# Patient Record
Sex: Male | Born: 1945 | Race: White | Hispanic: No | Smoking: Former smoker
Health system: Southern US, Community
[De-identification: ages and names within clinical notes are randomized; demographics above are authoritative.]

## PROBLEM LIST (undated history)

## (undated) ENCOUNTER — Emergency Department (HOSPITAL_COMMUNITY): Admission: EM | Payer: Medicare HMO | Source: Home / Self Care

## (undated) ENCOUNTER — Emergency Department (HOSPITAL_COMMUNITY): Admission: EM | Payer: Medicare Other | Source: Home / Self Care

## (undated) DIAGNOSIS — Z8739 Personal history of other diseases of the musculoskeletal system and connective tissue: Secondary | ICD-10-CM

## (undated) DIAGNOSIS — I509 Heart failure, unspecified: Secondary | ICD-10-CM

## (undated) DIAGNOSIS — E785 Hyperlipidemia, unspecified: Secondary | ICD-10-CM

## (undated) DIAGNOSIS — N400 Enlarged prostate without lower urinary tract symptoms: Secondary | ICD-10-CM

## (undated) DIAGNOSIS — Z8679 Personal history of other diseases of the circulatory system: Secondary | ICD-10-CM

## (undated) DIAGNOSIS — Z9889 Other specified postprocedural states: Secondary | ICD-10-CM

## (undated) DIAGNOSIS — E781 Pure hyperglyceridemia: Secondary | ICD-10-CM

## (undated) DIAGNOSIS — D571 Sickle-cell disease without crisis: Secondary | ICD-10-CM

## (undated) DIAGNOSIS — R200 Anesthesia of skin: Secondary | ICD-10-CM

## (undated) DIAGNOSIS — H579 Unspecified disorder of eye and adnexa: Secondary | ICD-10-CM

## (undated) DIAGNOSIS — R7303 Prediabetes: Secondary | ICD-10-CM

## (undated) DIAGNOSIS — I2510 Atherosclerotic heart disease of native coronary artery without angina pectoris: Secondary | ICD-10-CM

## (undated) DIAGNOSIS — I447 Left bundle-branch block, unspecified: Secondary | ICD-10-CM

## (undated) DIAGNOSIS — R972 Elevated prostate specific antigen [PSA]: Secondary | ICD-10-CM

## (undated) DIAGNOSIS — M1990 Unspecified osteoarthritis, unspecified site: Secondary | ICD-10-CM

## (undated) DIAGNOSIS — I428 Other cardiomyopathies: Secondary | ICD-10-CM

## (undated) DIAGNOSIS — R519 Headache, unspecified: Secondary | ICD-10-CM

## (undated) DIAGNOSIS — Z85820 Personal history of malignant melanoma of skin: Secondary | ICD-10-CM

## (undated) DIAGNOSIS — C439 Malignant melanoma of skin, unspecified: Secondary | ICD-10-CM

## (undated) HISTORY — PX: BACK SURGERY: SHX140

## (undated) HISTORY — DX: Headache, unspecified: R51.9

## (undated) HISTORY — DX: Unspecified disorder of eye and adnexa: H57.9

## (undated) HISTORY — DX: Malignant melanoma of skin, unspecified: C43.9

## (undated) HISTORY — DX: Anesthesia of skin: R20.0

## (undated) HISTORY — DX: Heart failure, unspecified: I50.9

## (undated) HISTORY — DX: Personal history of other diseases of the musculoskeletal system and connective tissue: Z87.39

## (undated) HISTORY — PX: CARPAL TUNNEL RELEASE: SHX101

## (undated) HISTORY — DX: Hyperlipidemia, unspecified: E78.5

## (undated) HISTORY — PX: OTHER SURGICAL HISTORY: SHX169

## (undated) HISTORY — DX: Personal history of other diseases of the circulatory system: Z86.79

## (undated) HISTORY — DX: Unspecified osteoarthritis, unspecified site: M19.90

## (undated) HISTORY — PX: CARDIAC CATHETERIZATION: SHX172

## (undated) HISTORY — DX: Other cardiomyopathies: I42.8

## (undated) HISTORY — DX: Sickle-cell disease without crisis: D57.1

## (undated) HISTORY — PX: APPENDECTOMY: SHX54

---

## 1998-10-05 ENCOUNTER — Ambulatory Visit (HOSPITAL_COMMUNITY): Admission: RE | Admit: 1998-10-05 | Discharge: 1998-10-05 | Payer: Self-pay | Admitting: Cardiology

## 1998-10-05 ENCOUNTER — Encounter: Payer: Self-pay | Admitting: Cardiology

## 1998-10-05 HISTORY — PX: CARDIOVASCULAR STRESS TEST: SHX262

## 1998-10-15 ENCOUNTER — Ambulatory Visit (HOSPITAL_COMMUNITY): Admission: RE | Admit: 1998-10-15 | Discharge: 1998-10-15 | Payer: Self-pay | Admitting: Cardiology

## 1999-01-02 ENCOUNTER — Emergency Department (HOSPITAL_COMMUNITY): Admission: EM | Admit: 1999-01-02 | Discharge: 1999-01-02 | Payer: Self-pay | Admitting: Emergency Medicine

## 2000-08-14 ENCOUNTER — Encounter: Admission: RE | Admit: 2000-08-14 | Discharge: 2000-08-14 | Payer: Self-pay | Admitting: Cardiology

## 2000-08-14 ENCOUNTER — Encounter: Payer: Self-pay | Admitting: Cardiology

## 2000-09-08 ENCOUNTER — Emergency Department (HOSPITAL_COMMUNITY): Admission: EM | Admit: 2000-09-08 | Discharge: 2000-09-08 | Payer: Self-pay | Admitting: Emergency Medicine

## 2000-09-11 ENCOUNTER — Inpatient Hospital Stay (HOSPITAL_COMMUNITY): Admission: AD | Admit: 2000-09-11 | Discharge: 2000-09-13 | Payer: Self-pay | Admitting: *Deleted

## 2000-09-11 ENCOUNTER — Encounter: Payer: Self-pay | Admitting: *Deleted

## 2000-09-12 ENCOUNTER — Encounter: Payer: Self-pay | Admitting: *Deleted

## 2000-09-12 HISTORY — PX: LUMBAR DISC SURGERY: SHX700

## 2001-04-19 ENCOUNTER — Ambulatory Visit (HOSPITAL_BASED_OUTPATIENT_CLINIC_OR_DEPARTMENT_OTHER): Admission: RE | Admit: 2001-04-19 | Discharge: 2001-04-19 | Payer: Self-pay | Admitting: Urology

## 2001-04-19 ENCOUNTER — Encounter (INDEPENDENT_AMBULATORY_CARE_PROVIDER_SITE_OTHER): Payer: Self-pay | Admitting: Specialist

## 2001-06-13 ENCOUNTER — Encounter: Payer: Self-pay | Admitting: *Deleted

## 2001-06-13 ENCOUNTER — Encounter: Admission: RE | Admit: 2001-06-13 | Discharge: 2001-06-13 | Payer: Self-pay | Admitting: *Deleted

## 2001-07-24 ENCOUNTER — Encounter: Admission: RE | Admit: 2001-07-24 | Discharge: 2001-07-24 | Payer: Self-pay | Admitting: *Deleted

## 2001-07-24 ENCOUNTER — Encounter: Payer: Self-pay | Admitting: *Deleted

## 2002-03-04 ENCOUNTER — Encounter: Admission: RE | Admit: 2002-03-04 | Discharge: 2002-03-04 | Payer: Self-pay | Admitting: *Deleted

## 2002-03-04 ENCOUNTER — Encounter: Payer: Self-pay | Admitting: *Deleted

## 2002-04-28 HISTORY — PX: LEFT HEART CATH AND CORONARY ANGIOGRAPHY: CATH118249

## 2002-05-22 ENCOUNTER — Inpatient Hospital Stay (HOSPITAL_COMMUNITY): Admission: EM | Admit: 2002-05-22 | Discharge: 2002-05-24 | Payer: Self-pay | Admitting: Emergency Medicine

## 2002-05-22 ENCOUNTER — Encounter: Payer: Self-pay | Admitting: Emergency Medicine

## 2002-05-23 ENCOUNTER — Encounter: Payer: Self-pay | Admitting: Cardiology

## 2002-06-27 ENCOUNTER — Encounter: Payer: Self-pay | Admitting: *Deleted

## 2002-06-27 ENCOUNTER — Encounter: Admission: RE | Admit: 2002-06-27 | Discharge: 2002-06-27 | Payer: Self-pay | Admitting: *Deleted

## 2002-07-22 ENCOUNTER — Encounter: Payer: Self-pay | Admitting: *Deleted

## 2002-07-22 ENCOUNTER — Encounter: Admission: RE | Admit: 2002-07-22 | Discharge: 2002-07-22 | Payer: Self-pay | Admitting: *Deleted

## 2004-06-30 ENCOUNTER — Encounter: Admission: RE | Admit: 2004-06-30 | Discharge: 2004-06-30 | Payer: Self-pay | Admitting: Family Medicine

## 2004-08-13 ENCOUNTER — Encounter: Admission: RE | Admit: 2004-08-13 | Discharge: 2004-08-13 | Payer: Self-pay | Admitting: Family Medicine

## 2005-05-23 ENCOUNTER — Encounter (INDEPENDENT_AMBULATORY_CARE_PROVIDER_SITE_OTHER): Payer: Self-pay | Admitting: *Deleted

## 2005-05-23 ENCOUNTER — Ambulatory Visit (HOSPITAL_BASED_OUTPATIENT_CLINIC_OR_DEPARTMENT_OTHER): Admission: RE | Admit: 2005-05-23 | Discharge: 2005-05-23 | Payer: Self-pay | Admitting: Urology

## 2006-04-10 ENCOUNTER — Encounter: Admission: RE | Admit: 2006-04-10 | Discharge: 2006-04-10 | Payer: Self-pay | Admitting: Family Medicine

## 2007-04-24 ENCOUNTER — Ambulatory Visit: Payer: Self-pay | Admitting: Gastroenterology

## 2007-05-08 ENCOUNTER — Ambulatory Visit: Payer: Self-pay | Admitting: Gastroenterology

## 2007-05-08 ENCOUNTER — Encounter: Payer: Self-pay | Admitting: Gastroenterology

## 2007-05-08 ENCOUNTER — Encounter (INDEPENDENT_AMBULATORY_CARE_PROVIDER_SITE_OTHER): Payer: Self-pay | Admitting: *Deleted

## 2008-03-31 ENCOUNTER — Ambulatory Visit (HOSPITAL_COMMUNITY): Admission: RE | Admit: 2008-03-31 | Discharge: 2008-03-31 | Payer: Self-pay | Admitting: Cardiology

## 2009-03-28 DIAGNOSIS — C439 Malignant melanoma of skin, unspecified: Secondary | ICD-10-CM

## 2009-03-28 HISTORY — DX: Malignant melanoma of skin, unspecified: C43.9

## 2009-06-23 ENCOUNTER — Ambulatory Visit: Payer: Self-pay | Admitting: Gastroenterology

## 2009-06-23 DIAGNOSIS — K625 Hemorrhage of anus and rectum: Secondary | ICD-10-CM | POA: Insufficient documentation

## 2009-07-09 LAB — CONVERTED CEMR LAB
Basophils Absolute: 0 10*3/uL (ref 0.0–0.1)
Basophils Relative: 0.8 % (ref 0.0–3.0)
Eosinophils Absolute: 0.2 10*3/uL (ref 0.0–0.7)
Eosinophils Relative: 4.1 % (ref 0.0–5.0)
HCT: 42.5 % (ref 39.0–52.0)
Hemoglobin: 13.7 g/dL (ref 13.0–17.0)
Lymphocytes Relative: 44.3 % (ref 12.0–46.0)
Lymphs Abs: 2 10*3/uL (ref 0.7–4.0)
MCHC: 32.3 g/dL (ref 30.0–36.0)
MCV: 92.5 fL (ref 78.0–100.0)
Monocytes Absolute: 0.4 10*3/uL (ref 0.1–1.0)
Monocytes Relative: 8 % (ref 3.0–12.0)
Neutro Abs: 1.9 10*3/uL (ref 1.4–7.7)
Neutrophils Relative %: 42.8 % — ABNORMAL LOW (ref 43.0–77.0)
Platelets: 149 10*3/uL — ABNORMAL LOW (ref 150.0–400.0)
RBC: 4.59 M/uL (ref 4.22–5.81)
RDW: 12.2 % (ref 11.5–14.6)
WBC: 4.5 10*3/uL (ref 4.5–10.5)

## 2009-09-04 DIAGNOSIS — I428 Other cardiomyopathies: Secondary | ICD-10-CM | POA: Insufficient documentation

## 2009-09-04 HISTORY — PX: TRANSTHORACIC ECHOCARDIOGRAM: SHX275

## 2009-10-14 ENCOUNTER — Ambulatory Visit (HOSPITAL_COMMUNITY): Admission: RE | Admit: 2009-10-14 | Discharge: 2009-10-14 | Payer: Self-pay | Admitting: Surgery

## 2009-10-14 HISTORY — PX: OTHER SURGICAL HISTORY: SHX169

## 2009-10-17 ENCOUNTER — Emergency Department (HOSPITAL_COMMUNITY): Admission: EM | Admit: 2009-10-17 | Discharge: 2009-10-17 | Payer: Self-pay | Admitting: Emergency Medicine

## 2010-04-29 NOTE — Procedures (Signed)
Summary: Colon   Colonoscopy  Procedure date:  05/08/2007  Findings:      Location:  [facility].   Patient Name: [name], [name] MRN:  Procedure Procedures: Colonoscopy CPT: 95621.    with biopsy. CPT: Q5068410.  Personnel: Endoscopist: Rachael Fee, MD.  Referred By: Meredith Staggers, M.D.  Exam Location: Exam performed in Endoscopy Suite. Outpatient  Patient Consent: Procedure, Alternatives, Risks and Benefits discussed, consent obtained, from patient. Consent was obtained by the RN.  Indications  Average Risk Screening Routine.  History  Current Medications: Patient is not currently taking Coumadin.  Comments: Patient history reviewed and updated, pre-procedure physical performed prior to initiation of sedation? yes Pre-Exam Physical: Performed May 08, 2007. Cardio-pulmonary exam, Abdominal exam, Mental status exam WNL.  Exam Exam: Extent of exam reached: Cecum, extent intended: Cecum.  The cecum was identified by appendiceal orifice and IC valve. Patient position: on left side. Time to Cecum: 00:02:04. Time for Withdrawl: 00:09:35. Colon retroflexion performed. Images taken. ASA Classification: II. Tolerance: good.  Monitoring: Pulse and BP monitoring, Oximetry used. Supplemental O2 given.  Colon Prep Prep results: good.  Sedation Meds: Patient assessed and found to be appropriate for moderate (conscious) sedation. Fentanyl 75 mcg. given IV. Versed 6 mg. given IV.  Findings POLYP: Descending Colon, Maximum size: 2 mm. sessile polyp. Procedure:  biopsy without cautery, removed, retrieved, Polyp sent to pathology. Path # 1.  - DIVERTICULOSIS: Descending Colon to Sigmoid Colon. Comments: a few small diverticulum.  - NORMAL EXAM: Cecum to Rectum. Comments: otherwise normal examination.  HEMORRHOIDS: Internal. Size: Small.   Assessment Abnormal examination, see findings above.  Comments: SINGLE SMALL COLON POLYP, NO CANCERS.  SMALL INTERNAL  HEMORRHOIDS AND A FEW LEFT SIDED DIVERTICULUM. Events  Unplanned Interventions: No intervention was required.  Unplanned Events: There were no complications. Plans Comments: IF THE POLYP IS A TUBULAR ADENOMA (PRECANCEROUS POLYP), HE WILL NEED A REPEAT COLONOSCOPY IN 5 YEARS.  OTHERWISE, HE SHOULD CONTINUE TO FOLLOW COLORECTAL CANCER SCREENING GUIDELINES WITH A REPEAT COLONOSCOPY IN 10 YEARS. This report was created from the original endoscopy report, which was reviewed and signed by the above listed endoscopist.

## 2010-04-29 NOTE — Assessment & Plan Note (Signed)
History of Present Illness Visit Type: Initial Consult Primary GI MD: Rob Bunting MD Primary Provider: Meredith Staggers, MD Requesting Provider: Meredith Staggers, MD Chief Complaint: rectal bleeding History of Present Illness:     whom I did a routine screening colonscopy 2 years ago.  I found one to 2 small adenomatous polyps, he is already set for surveillance colonoscopy at five-year interval. I also documented some left-sided diverticulosis as well as small internal hemorrhoids.  About once every month or he will notice small amount of red blood on tissue paper, this has been going one for 5-6 years.  He does not tend to be constipated.   He will feel small external anal bumps and takes one suppository, this reliably resolves his symptoms.  he had lab testing done recently showing normal basic metabolic profile. CBC not performed.           Current Medications (verified): 1)  Ecotrin 325 Mg Tbec (Aspirin) .Marland Kitchen.. 1 By Mouth Once Daily 2)  Viagra 50 Mg Tabs (Sildenafil Citrate) .... As Needed 3)  Gabapentin 300 Mg Caps (Gabapentin) .... As Needed 4)  Vitamin B-12 500 Mcg Tabs (Cyanocobalamin) .Marland Kitchen.. 1 By Mouth Once Daily 5)  Daily Vites  Tabs (Multiple Vitamin) .Marland Kitchen.. 1 By Mouth Once Daily 6)  Red Yeast Rice Extract 600 Mg Caps (Red Yeast Rice Extract) .Marland Kitchen.. 1 By Mouth Once Daily 7)  Saw Palmetto 450 Mg Caps (Saw Palmetto (Serenoa Repens)) .Marland Kitchen.. 1 By Mouth Once Daily 8)  Garlic 1000 Mg Caps (Garlic) .Marland Kitchen.. 1 By Mouth Once Daily 9)  Fish Oil 1000 Mg Caps (Omega-3 Fatty Acids) .... 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  All other review of systems was otherwise negative.   Vital Signs:  Patient profile:   65 year old male Height:      70 inches Weight:      196.50 pounds BMI:     28.30 Pulse rate:   64 / minute Pulse rhythm:   regular BP sitting:   110 / 60  (left arm) Cuff size:   regular  Vitals Entered By: June McMurray CMA Duncan Dull) (June 23, 2009 9:01 AM)  Physical Exam  Additional Exam:  Constitutional: generally well appearing Psychiatric: alert and oriented times 3 Eyes: extraocular movements intact Mouth: oropharynx moist, no lesions Neck: supple, no lymphadenopathy Cardiovascular: heart regular rate and rythm Lungs: CTA bilaterally Abdomen: soft, non-tender, non-distended, no obvious ascites, no peritoneal signs, normal bowel sounds Extremities: no lower extremity edema bilaterally Skin: no lesions on visible extremities anorectal examination: no external anal hemorrhoids, no anal rectal masses, stool is brown, Hemoccult negative   Impression & Recommendations:  Problem # 1:  intermittent blood on toilet paper, likely hemorrhoidal hemorrhoids can come and go. He currently does not have any hemorrhoids on examination however 2 years ago I didn't document some during his colonoscopy. He also feels hemorrhoidal-type bumps around the time of some anal itching and minor rectal bleeding. This is almost certainly from hemorrhoids. Possibly a small  anal fissure. He had a full colonoscopy about 2 years ago and I do not think that needs to be repeated at this point. We will get a CBC to make sure he is not anemic however he does not appear to be clinically. I recommended he continue doing what he is doing with intermittent over-the-counter anal care every month or 2 when he has minor itching sensation or small bleeding. He knows to call if this gets worse. Otherwise we  will simply see him back for his surveillance colonoscopy in about 3 years time from now.  Other Orders: TLB-CBC Platelet - w/Differential ([id number])  Patient Instructions: 1)  Continue periodic suppository, as needed for hemorrhoids.  If these become more bothersome call Dr. Christella Hartigan. 2)  A copy of this information will be sent to Dr. Chilton Si. 3)  You will get lab test(s) done today (cbc). 4)  You are already scheduled for repeat colonoscopy in 2014, february. 5)  The medication list was reviewed and reconciled.  All changed / newly prescribed medications were explained.  A complete medication list was provided to the patient / caregiver.

## 2010-06-12 LAB — URINALYSIS, ROUTINE W REFLEX MICROSCOPIC
Bilirubin Urine: NEGATIVE
Glucose, UA: NEGATIVE mg/dL
Hgb urine dipstick: NEGATIVE
Ketones, ur: NEGATIVE mg/dL
Nitrite: NEGATIVE
Protein, ur: NEGATIVE mg/dL
Specific Gravity, Urine: 1.028 (ref 1.005–1.030)
Urobilinogen, UA: 0.2 mg/dL (ref 0.0–1.0)
pH: 5.5 (ref 5.0–8.0)

## 2010-06-12 LAB — CSF CELL COUNT WITH DIFFERENTIAL
RBC Count, CSF: 132 /mm3 — ABNORMAL HIGH
RBC Count, CSF: 2 /mm3 — ABNORMAL HIGH
Tube #: 1
Tube #: 4
WBC, CSF: 0 /mm3 (ref 0–5)
WBC, CSF: 1 /mm3 (ref 0–5)

## 2010-06-12 LAB — CBC
HCT: 42.4 % (ref 39.0–52.0)
HCT: 37.7 % — ABNORMAL LOW (ref 39.0–52.0)
Hemoglobin: 14.3 g/dL (ref 13.0–17.0)
Hemoglobin: 12.9 g/dL — ABNORMAL LOW (ref 13.0–17.0)
MCH: 31 pg (ref 26.0–34.0)
MCH: 31 pg (ref 26.0–34.0)
MCHC: 33.8 g/dL (ref 30.0–36.0)
MCHC: 34.2 g/dL (ref 30.0–36.0)
MCV: 91.7 fL (ref 78.0–100.0)
MCV: 90.7 fL (ref 78.0–100.0)
Platelets: 138 10*3/uL — ABNORMAL LOW (ref 150–400)
Platelets: 139 10*3/uL — ABNORMAL LOW (ref 150–400)
RBC: 4.62 MIL/uL (ref 4.22–5.81)
RBC: 4.16 MIL/uL — ABNORMAL LOW (ref 4.22–5.81)
RDW: 13.5 % (ref 11.5–15.5)
RDW: 13 % (ref 11.5–15.5)
WBC: 8.4 10*3/uL (ref 4.0–10.5)
WBC: 6.3 10*3/uL (ref 4.0–10.5)

## 2010-06-12 LAB — COMPREHENSIVE METABOLIC PANEL
ALT: 24 U/L (ref 0–53)
AST: 18 U/L (ref 0–37)
Albumin: 3.9 g/dL (ref 3.5–5.2)
Alkaline Phosphatase: 54 U/L (ref 39–117)
BUN: 23 mg/dL (ref 6–23)
CO2: 24 mEq/L (ref 19–32)
Calcium: 8.7 mg/dL (ref 8.4–10.5)
Chloride: 106 mEq/L (ref 96–112)
Creatinine, Ser: 1 mg/dL (ref 0.4–1.5)
GFR calc Af Amer: >60 mL/min (ref >60)
GFR calc non Af Amer: >60 mL/min (ref >60)
Glucose, Bld: 97 mg/dL (ref 70–99)
Potassium: 4 mEq/L (ref 3.5–5.1)
Sodium: 138 mEq/L (ref 135–145)
Total Bilirubin: 0.5 mg/dL (ref 0.3–1.2)
Total Protein: 7 g/dL (ref 6.0–8.3)

## 2010-06-12 LAB — GRAM STAIN

## 2010-06-12 LAB — DIFFERENTIAL
Basophils Absolute: 0 10*3/uL (ref 0.0–0.1)
Basophils Relative: 0 % (ref 0–1)
Eosinophils Absolute: 0 10*3/uL (ref 0.0–0.7)
Eosinophils Relative: 0 % (ref 0–5)
Lymphocytes Relative: 9 % — ABNORMAL LOW (ref 12–46)
Lymphs Abs: 0.8 10*3/uL (ref 0.7–4.0)
Monocytes Absolute: 0.3 10*3/uL (ref 0.1–1.0)
Monocytes Relative: 3 % (ref 3–12)
Neutro Abs: 7.3 10*3/uL (ref 1.7–7.7)
Neutrophils Relative %: 87 % — ABNORMAL HIGH (ref 43–77)

## 2010-06-12 LAB — CSF CULTURE W GRAM STAIN: Culture: NO GROWTH

## 2010-06-12 LAB — PROTEIN AND GLUCOSE, CSF
Glucose, CSF: 63 mg/dL (ref 43–76)
Total  Protein, CSF: 60 mg/dL — ABNORMAL HIGH (ref 15–45)

## 2010-06-12 LAB — SURGICAL PCR SCREEN
MRSA, PCR: NEGATIVE
Staphylococcus aureus: NEGATIVE

## 2010-08-13 NOTE — Op Note (Signed)
[facility]  Patient:    [name] [name], [name] [name] Visit Number: [id number] MRN: [id number]          Service Type: NES Location: [facility] Attending Physician:  Thermon Leyland Dictated by:   Barron Alvine, M.D. Proc. Date: 04/19/01 Admit Date:  04/19/2001 Discharge Date: 04/19/2001                             Operative Report  MAKEUP OPERATIVE REPORT  PREOPERATIVE DIAGNOSIS:  Elevated PSA.  POSTOPERATIVE DIAGNOSIS:  Elevated PSA.  OPERATION/PROCEDURE:  Transrectal ultrasound of the prostate with ultrasound guided biopsy.  SURGEON:  Barron Alvine, M.D.  ANESTHESIA:  General  INDICATIONS:  This is a 65 year old male.  He was noted recently to have a mildly elevated PSA.  He was scheduled, in our office, for a transrectal ultrasound of the prostate with ultrasound guided biopsy.  On attempting to biopsy the patient had considerable spasming of his rectum.  The patient complained of severe discomfort in our office and, therefore, the procedure had to be aborted.  He had previously had an ultrasound a week earlier which had gone reasonably well, but he could simply not tolerate the probe.  We discussed options with him and he elected to have the biopsy performed under general anesthesia. The advantages and disadvantages were discussed with him in some detail.  TECHNIQUE AND THE FINDINGS:  The patient was brought to the operating room where he has successful induction of general anesthesia.  He was then placed in the lateral decubitus position.  A semirigid transrectal probe was inserted.  Again, previous ultrasound of the prostate had been performed.  Of note, there were no obvious hypoechoic areas.  We then performed approximately 6 biopsies of the right and 6 biopsies of the left lobe.  These were sent separately.  The procedure was well tolerated and there were no obvious complications.  He was brought to the recovery room in stable condition. Dictated  by:   Barron Alvine, M.D. Attending Physician:  Thermon Leyland DD:  05/14/01 TD:  05/14/01 Job: [id number]

## 2010-08-13 NOTE — H&P (Signed)
. [facility]  Patient:    [name] [name], [name] [name]                  MRN: [id number] Adm. Date:  09/11/00 Attending:  Reynolds Bowl, M.D.                         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  HISTORY:  I first saw him on September 06, 2000, with a history that three or four weeks ago while at work, he was carrying a heavy load on his shoulder, going down some steps.  It bothered him slightly then, then gradually got better, and then gradually got worse.  A few days before I saw him, Dr. Harrel Lemon. Merla Riches had placed him on prednisone.  At the time, he stated that the pain went all the way down the left leg, lateral hip, lateral calf, and deep into the foot.  Sometimes he felt that his muscles were twitching.  Since that time it was felt that he had a herniated nucleus pulposus of L5-S1 on the left.  We asked him to continue the prednisone dosepak, and went on to ask for an MRI. Over the past four days this patient has had severe pain.  He cannot walk, sit, lay down, or sleep, despite being on heavy analgesic treatment.  His pain is all in the left hip, down the left lower extremity, including the left foot.  Because of a problem with pain control, I had him come to the office today. We discussed surgery, and we discussed admitting him to the hospital today. We will have a medical evaluation if it is needed, and we will do a diskectomy tomorrow in the afternoon.  PAST MEDICAL HISTORY:  The patient has a history of myopathy, diagnosed and treated by Dr. Julieanne Manson.  Apparently he has had the latest evaluation in May, and he was felt to be stable and probably okay for surgery.  SOCIAL HISTORY:  No smoking of cigarettes for 20 years.  He does not drink ethanol.  FAMILY HISTORY:  Positive for tuberculosis.  REVIEW OF SYSTEMS:   Other than mentioned, he has a history of gout in the past.  PHYSICAL EXAMINATION:  GENERAL:  The patient can barely get from the wheelchair to the bed and can barely sit up.  HEENT:  Extraocular movements are full.  Pharynx is fair.  Teeth are in good repair.  NECK:  Moves well without apparent pain.  There is no carotid bruit.  No palpable mass.  LUNGS:  Breath sounds:  Fair volume, breath sounds clear.  HEART:  Seemed to be fairly regular.  No murmurs heard.  There is an intermittent irregular heart sound.  ABDOMEN:  Round, soft, nontender.  No palpable masses.  Bowel sounds are active.  EXTREMITIES:  His femoral and dorsalis pedis pulses are intact.  NEUROLOGIC:  Straight leg raising is positive at 45 degrees on the left, negative on the right.  Manual motor strength seems to be adequate.  Left ankle reflex is 0, the right is 1-2+.  Pin sensation is decreased along the lateral border of the foot.  MRI shows a fairly large extruded herniated nucleus pulposus at L5-S1, going down the body of S1.  ADMISSION DIAGNOSES: 1. Herniated nucleus pulposus L5-S1, left. 2. Cardiomyopathy.  PLAN:  To admit the  patient now for pain control.  I will have anesthesia evaluate him tonight, and will plan on a microdiskectomy by me tomorrow.  This has been fully discussed.  He knows that there are no guaranties.  He anticipates discharge on the day following surgery.  He has been given a prescription for Tylox to take postoperatively. DD:  09/11/00 TD:  09/11/00 Job: [id number]

## 2010-08-13 NOTE — Cardiovascular Report (Signed)
NAME:  [name] [name], LIOTTA NO.:  [id number]   MEDICAL RECORD NO.:  [id number]                   PATIENT TYPE:  INP   LOCATION:  1825                                 FACILITY:  [facility]   PHYSICIAN:  Thereasa Solo. Little, M.D.              DATE OF BIRTH:  1946-02-15   DATE OF PROCEDURE:  05/23/2002  DATE OF DISCHARGE:                              CARDIAC CATHETERIZATION   INDICATIONS FOR TEST:  The patient is a 65 year old male who presented to  the emergency room with a two-day history of chest pain and upper back pain  that became severe in nature last night. He is now having bilateral pain in  his shoulders, upper back pain and breathlessness.  His ECG shows no acute  changes.  His CK-MB and his troponin are negative.   DESCRIPTION OF PROCEDURE:  The patient was prepped and draped in the usual  sterile fashion exposing the right groin.  Following local anesthetic with  1% Xylocaine, the Seldinger technique employed and a 5 [place] introducer  sheath was placed into the right femoral artery.  Selective left and right  coronary arteriography and ventriculography in the RAO projection was  performed.   TOTAL CONTRAST USED:  100 cc.   COMPLICATIONS:  None.   MEDICATIONS DURING THE PROCEDURE:  A total of 6 mg of IV Nubain and 2 mg of  IV Versed.  Despite this, patient still with significant pain.   RESULTS:  1. Hemodynamic monitoring:  Central aortic pressure was 87/53, left     ventricular pressure 86/9.  Prior to giving him intracoronary     nitroglycerin his systolic pressure was around 110.  2. Ventriculography:  Ventriculography in the RAO projection using 20 mL of     contrast revealed normal left ventricular systolic function.  Ejection     fraction calculated at 50%. The end-diastolic pressure was 15.  The     aortic root size appeared to be normal.   CORONARY ARTERIOGRAPHY:  There was no calcification seen on fluoroscopy.  1. Left main:  There  appeared to be a very short left main or a common     ostium for both the LAD and circumflex.  2. LAD:  The LAD extended down to the apex.  The heart gave rise to three     diagonal branches.  There was TIMI-2 flow down this entire vessel. There     was no obvious intimal flap noted and there was only minor irregularity     at the third diagonal of less than 20%.  Intracoronary nitroglycerin     resulted in a slight increase in flow down the vessel but it never became     TIMI-3 flow.  3. Circumflex:  The circumflex was a large vessel that gave rise to an OM     and the PDA, all of which were free of disease.  4. Right coronary artery:  Small nondominant vessel with TIMI-2 flow also.   CONCLUSION:  1. Minimal irregularities in the left anterior descending.  2. Normal left ventricular systolic function at 50% (improvement from 2000     catheterization).    PLAN:  At this point, I cannot explain his pain from a cardiac standpoint.  Despite having relatively slow flow it looks more like a capillary bed  problem or small vessel disease and not epicardial disease.  I cannot rule  out a aortic dissection.  Because of this he is being taken directly from  the catheterization lab to CT for an emergency chest CT to rule out aortic  dissection.                                                   Thereasa Solo. Little, M.D.    ABL/MEDQ  D:  05/23/2002  T:  05/23/2002  Job:  [id number]   cc:   Harrel Lemon. Merla Riches, M.D.  [address]  [place]  [place] [address]  Fax: [phone]   Cardiac Catheterization Lab

## 2010-08-13 NOTE — Op Note (Signed)
NAME:  [name] [name], AUMENT NO.:  [id number]   MEDICAL RECORD NO.:  [id number]          PATIENT TYPE:  AMB   LOCATION:  [facility]                         FACILITY:  [facility]   PHYSICIAN:  Valetta Fuller, M.D.  DATE OF BIRTH:  14-May-1945   DATE OF PROCEDURE:  DATE OF DISCHARGE:                                 OPERATIVE REPORT   PREOPERATIVE DIAGNOSIS:  Elevated prostate specific antigen.   POSTOPERATIVE DIAGNOSIS:  Elevated prostate specific antigen.   PROCEDURE PERFORMED:  Transrectal ultrasound of the prostate with ultrasound-  guided biopsy.   SURGEON:  Valetta Fuller, M.D.   ANESTHESIA:  General.   INDICATIONS:  [name] is a 65 year old male.  He has a history of bladder  neck obstruction and a mildly elevated PSA.  Approximately three years ago,  the patient had a PSA in the mid 4 range and underwent an ultrasound and  biopsy, which was negative.  His PSA remained fairly stable but recently had  increased to 5.5 with a reduced PSA 2 reading of approximately 17%.  We  recommended repeat biopsy.  Digital rectal exam was unremarkable.  The  patient has quite a bit of discomfort with prostate exam and requested again  that ultrasound of the biopsy be done under anesthesia, which had been done  in the past.   TECHNIQUE/FINDINGS:  The patient was brought to the operating room.  He had  received preoperative antibiotics with gentamicin and Ancef.  He was placed  in the lateral decubitus position after successful induction of general  anesthesia.  A transrectal ultrasound probe was then inserted.  Representative sagittal transverse images of the prostate were taken and  saved.  Prostate volume determinations were also performed.  The patient had  considerable intraprostatic calcifications, which resulted in a lot of  shadowing, but we did not see any definitive hypoechoic areas.  Seminal  vesicles appeared unremarkable, and capsular perimeter appeared symmetric.  The  patient then underwent biopsy with six biopsies taken of both lobes,  which  were sent separately.  This appeared to be well tolerated by the  patient.  He was brought to the recovery room in stable condition.           ______________________________  Valetta Fuller, M.D.  Electronically Signed     DSG/MEDQ  D:  05/23/2005  T:  05/23/2005  Job:  [id number]

## 2010-08-13 NOTE — Op Note (Signed)
[place]  Patient:    [name] [name], [name] [name]                        MRN: [id number] Adm. Date:  [id number] Attending:  Maryanna Shape                           Operative Report  PREOPERATIVE DIAGNOSIS:  Herniated nucleus pulposus L5-S1 acute.  POSTOPERATIVE DIAGNOSIS:  Herniated nucleus pulposus L5-S1 acute.  OPERATIVE PROCEDURE:  Microdiskectomy at L5-S1 left.  ANESTHESIA:  General by endotracheal tube.  SURGEON:  Reynolds Bowl, M.D.  ASSISTANT:  Colon Flattery. Ollen Bowl, M.D.  DESCRIPTION:  Patient was taken to the operating room and after evaluation by the anesthesiologist he was given general anesthetic by endotracheal tube after an IV was started, placed in the operative frame in the usual manner, supported.  Ancef 1 g was given IV.  While he was on the frame, his back was shaved in the surgical area and back prepped with DuraPrep and draped with Ioban and drapes in usual fashion.  I then used two 18 gauge needles to try to localize the L5-S1 disk space and took an x-ray.  One of them was at the 4-5 space, one was the space above.  I therefore adjusted my incision to center it over the 5-1 disk space and went down to the lumbar fascia.  Subcutaneous bleeders were Bovie cauterized.  The lumbar fascia was divided then the paraspinal muscles subperiosteally reflected laterally, a Taylor retractor put in place, the L5-S1 inner space identified.  I then removed some superficial portion of ligamentum flavum, then incised, protected the dura, and excised the ligamentum flavum sharply and with using the Kerrisons.  I removed some of the lower border of the L5 lamina, some of the S1 lamina, and went out far enough to do a foraminotomy and laterally more proximally to the side wall.  The nerve root was found to be fairly tight over a fairly large bulging disk.  I probed with Epstein instrument and a round tipped nerve hook and could not identify any  definitely loose fragment; it was all contained in this posterior herniation.  I was able to tease the nerve root over enough to expose part of the disk, which was then opened sharply.  Then I removed a fair amount of material using the micro pituitary rongeurs.  I then, with lessening of tension, again passed around the nerve root proximally, distally, medially, and I could not feel any loose fragments but the nerve did move over a little bit more easily at that point and I enlarged the incision in the annulus and spent time making multiple passes with the pituitary rongeurs until it was felt like it was pretty well emptied of any loose material.  The nerve root appeared to be somewhat edematous.  I followed the nerve root out around the pedicle and decompressed further out so that I could see a large portion of the nerve root laying quite freely.  Having done that the area was copiously irrigated, the microscope was removed, then approximated the lumbar fascia with figure-of-eight sutures of #1 Vicryl, the subcutaneous tissues with 2-0 Vicryl and the skin edges with metal staples, following which a dressing was applied.  The patient was returned to the recovery room in good condition.  During the procedure there was one persistent vein that continued bleeding and somewhere  near the end of the procedure I was able to capture it enough to use low-grade bipolar cauterization when we had the nerve root quite well away from the area and protected.  So the estimated blood loss may have been 150 cc; none was replaced.  Patient returned to the recovery room in good condition. DD:  09/12/00 TD:  09/12/00 Job: [id number]

## 2010-08-13 NOTE — Discharge Summary (Signed)
NAME:  [name] [name], [name] [name] NO.:  [id number]   MEDICAL RECORD NO.:  [id number]                   PATIENT TYPE:  INP   LOCATION:  4741                                 FACILITY:  [facility]   PHYSICIAN:  Thereasa Solo. Little, M.D.              DATE OF BIRTH:  11-12-1945   DATE OF ADMISSION:  05/22/2002  DATE OF DISCHARGE:  05/24/2002                                 DISCHARGE SUMMARY   ADMISSION DIAGNOSES:  1. Chest pain, rule out myocardial infarction.  2. Anxiety.  3. Gastrointestinal reflux disease.   PROCEDURES:  Cardiac catheterization on May 23, 2002.   COMPLICATIONS:  None.   DISCHARGE STATUS:  Stable.   HISTORY OF PRESENT ILLNESS:  This is a 65 year old male who presented to the  emergency room with approximately a two to seven-day history of chest pain  and upper back pain that apparently became very severe the night of  admission.  He was also having some pain in both shoulders, as well as some  shortness of breath and dizziness.  Denied awareness of tachycardia or  arrhythmia.  Upon presenting to the emergency room, however, his discomfort  spontaneously resolved and he was essentially pain-free for the rest of the  admission.   LABORATORY DATA:  Admission laboratory studies were all normal, including  cardiac enzymes.  EKG showed a normal sinus rhythm with no ischemic changes.   HOSPITAL COURSE:  He was taken to the cardiac catheterization lab on  May 23, 2002.  Results showed normal LV function with an EF of 50%.  LAD was found with only very minimal irregularities of less than 20% in the  third diagonal.  The rest of the coronaries were free of disease.  At this  point, he underwent a CT scan of his chest to rule out aortic dissection.   The admission chest x-ray was normal.  CT scan showed no acute process.  He  essentially was pain-free for the rest of the admission.  Vital signs  remained stable.  At this point, it was suspected  that he was having some  reflux symptoms contributing to his symptoms, as well as trapezius muscle  spasms.  He was placed on Flexeril.  Helicobacter pylori was tested and he  was started on Protonix.  He was given Toradol as well.   On May 24, 2002, he again was pain-free with stable vital signs.  Normal sinus rhythm on monitor.  The catheterization site had only minimal  bleeding.  No hematoma.  He was discharged home with follow-up at [facility] on a p.r.n. basis.   DISCHARGE MEDICATIONS:  1. Darvocet-N 100 one tablet q.4h. p.r.n. pain.  2. Protonix 40 mg daily.  3. Wellbutrin XL 150 mg daily.  4. Altace 5 mg daily.  5. Toprol 25 mg daily.  6. Aspirin 81 mg daily.  7. Nitroglycerin 0.4 mg p.r.n.  ACTIVITY:  He is to undergo limited activity and no strenuous physical  activities for the next 24 hours.   DIET:  He is to maintain a low-salt, low-fat, low-cholesterol diet.   FOLLOW-UP:  He is to call Dr. Merla Riches for an appointment for follow-up at  [facility].     Adrian Saran, N.P.                        Thereasa Solo. Little, M.D.    HB/MEDQ  D:  07/19/2002  T:  07/21/2002  Job:  [id number]

## 2011-03-18 ENCOUNTER — Ambulatory Visit (INDEPENDENT_AMBULATORY_CARE_PROVIDER_SITE_OTHER): Payer: Medicare Other | Admitting: Family Medicine

## 2011-03-18 DIAGNOSIS — IMO0001 Reserved for inherently not codable concepts without codable children: Secondary | ICD-10-CM

## 2011-06-10 ENCOUNTER — Telehealth: Payer: Self-pay

## 2011-06-10 ENCOUNTER — Ambulatory Visit: Payer: Medicare Other

## 2011-06-10 DIAGNOSIS — R079 Chest pain, unspecified: Secondary | ICD-10-CM | POA: Diagnosis not present

## 2011-06-10 DIAGNOSIS — E782 Mixed hyperlipidemia: Secondary | ICD-10-CM | POA: Diagnosis not present

## 2011-07-08 ENCOUNTER — Encounter: Payer: Self-pay | Admitting: Family Medicine

## 2011-07-08 ENCOUNTER — Ambulatory Visit (INDEPENDENT_AMBULATORY_CARE_PROVIDER_SITE_OTHER): Payer: Medicare Other | Admitting: Family Medicine

## 2011-07-08 VITALS — BP 107/64 | HR 85 | Temp 98.0°F | Resp 16 | Ht 68.0 in | Wt 197.8 lb

## 2011-07-08 DIAGNOSIS — J069 Acute upper respiratory infection, unspecified: Secondary | ICD-10-CM

## 2011-07-08 DIAGNOSIS — E781 Pure hyperglyceridemia: Secondary | ICD-10-CM | POA: Diagnosis not present

## 2011-07-08 DIAGNOSIS — M2550 Pain in unspecified joint: Secondary | ICD-10-CM

## 2011-07-08 DIAGNOSIS — N529 Male erectile dysfunction, unspecified: Secondary | ICD-10-CM

## 2011-07-08 MED ORDER — LOVASTATIN 10 MG PO TABS: 10.0000 mg | ORAL_TABLET | Freq: Every day | ORAL | Status: DC

## 2011-07-08 MED ORDER — SILDENAFIL CITRATE 100 MG PO TABS: ORAL_TABLET | ORAL | Status: DC

## 2011-07-08 NOTE — Progress Notes (Signed)
  Subjective:    Patient ID: [name] [name], male    DOB: [birth year], 66 y.o.   MRN: [id number]  HPI [name] [name] is a 66 y.o. male  Hyperlipidemia/Hypertriglyceridemia - hx of idiopathic cm. Started lovastatin 10mg  in 12/12  (stopped fenofibrate) as LDL 139, HDL 37.  TRigs 137.  Has had some different areas of soreness, all over- but thinks due to being more active. Still taking red yeast rice and fish oil.  No recent chest pains.  Last OV with Dr. Clarene Duke few months ago. Cards is aware that he takes Viagra   Arthritis in hands - flairs up with increase in activity..  No recent changes or injuries. Helps with hot water, alcohol.  Not very mush tylenol.  Review of Systems  HENT: Positive for congestion.        Few days - no fevers  Respiratory: Negative for chest tightness and shortness of breath.   Gastrointestinal: Negative for abdominal pain.  Musculoskeletal: Positive for arthralgias.       Objective:   Physical Exam  Constitutional: He is oriented to person, place, and time. He appears well-developed and well-nourished.  HENT:  Head: Normocephalic and atraumatic.  Eyes: EOM are normal. Pupils are equal, round, and reactive to light.  Neck: No JVD present. Carotid bruit is not present.  Cardiovascular: Normal rate, regular rhythm and normal heart sounds.   No murmur heard. Pulmonary/Chest: Effort normal and breath sounds normal. He has no rales.  Abdominal: Soft. There is no tenderness.  Musculoskeletal: He exhibits no edema.       Min ttp with [facility] at [facility] with slight enlargement.  No PIP/DIP involvement/.   Neurological: He is alert and oriented to person, place, and time.  Skin: Skin is warm and dry.  Psychiatric: He has a normal mood and affect.          Assessment & Plan:  [name] [name] is a 66 y.o. male 1. Hypertriglyceridemia  Lipid panel, Comprehensive metabolic panel  2. ED (erectile dysfunction)    3. URI (upper respiratory infection)     Hyperlipidemia -  suspect arthralgias with OA.  Check CMP, lipids, CK.  Cont lovastatin at same dose for now.  URi vs allergies - can try otc Claritiin - 1 per day.  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  RTC or consider rheum referral if increased swelling or worsening.   ED - refilled viagra - SED, and ER CP precautions reviewed.  REcheck 6 months, sooner if worse.

## 2011-07-08 NOTE — Patient Instructions (Signed)
Labs should be back in the next 1 week. If any changes needed, we will let you know.  Take claritin 1 per day for allergies - return to clinic if any fever or worse symptoms in chest.  Tylenol over the counter for aches.  Recheck if not improving.

## 2011-07-09 LAB — COMPREHENSIVE METABOLIC PANEL
ALT: 25 U/L (ref 0–53)
AST: 28 U/L (ref 0–37)
Albumin: 4.5 g/dL (ref 3.5–5.2)
Alkaline Phosphatase: 56 U/L (ref 39–117)
BUN: 22 mg/dL (ref 6–23)
CO2: 24 mEq/L (ref 19–32)
Calcium: 9.2 mg/dL (ref 8.4–10.5)
Chloride: 108 mEq/L (ref 96–112)
Creat: 1 mg/dL (ref 0.50–1.35)
Glucose, Bld: 89 mg/dL (ref 70–99)
Potassium: 4.1 mEq/L (ref 3.5–5.3)
Sodium: 141 mEq/L (ref 135–145)
Total Bilirubin: 0.5 mg/dL (ref 0.3–1.2)
Total Protein: 7.1 g/dL (ref 6.0–8.3)

## 2011-07-09 LAB — LIPID PANEL
Cholesterol: 185 mg/dL (ref 0–200)
HDL: 32 mg/dL — ABNORMAL LOW (ref >39)
LDL Cholesterol: 113 mg/dL — ABNORMAL HIGH (ref 0–99)
Total CHOL/HDL Ratio: 5.8 Ratio
Triglycerides: 201 mg/dL — ABNORMAL HIGH (ref <150)
VLDL: 40 mg/dL (ref 0–40)

## 2011-07-09 LAB — CK: Total CK: 455 U/L — ABNORMAL HIGH (ref 7–232)

## 2011-07-15 DIAGNOSIS — R972 Elevated prostate specific antigen [PSA]: Secondary | ICD-10-CM | POA: Diagnosis not present

## 2011-07-22 DIAGNOSIS — N401 Enlarged prostate with lower urinary tract symptoms: Secondary | ICD-10-CM | POA: Diagnosis not present

## 2011-07-22 DIAGNOSIS — R972 Elevated prostate specific antigen [PSA]: Secondary | ICD-10-CM | POA: Diagnosis not present

## 2011-07-29 ENCOUNTER — Encounter: Payer: Self-pay | Admitting: Family Medicine

## 2011-07-29 ENCOUNTER — Ambulatory Visit (INDEPENDENT_AMBULATORY_CARE_PROVIDER_SITE_OTHER): Payer: Medicare Other | Admitting: Family Medicine

## 2011-07-29 VITALS — BP 114/70 | HR 87 | Temp 96.7°F | Resp 16 | Ht 68.0 in | Wt 202.8 lb

## 2011-07-29 DIAGNOSIS — M25449 Effusion, unspecified hand: Secondary | ICD-10-CM

## 2011-07-29 DIAGNOSIS — E785 Hyperlipidemia, unspecified: Secondary | ICD-10-CM

## 2011-07-29 DIAGNOSIS — M25549 Pain in joints of unspecified hand: Secondary | ICD-10-CM

## 2011-07-29 DIAGNOSIS — K649 Unspecified hemorrhoids: Secondary | ICD-10-CM | POA: Diagnosis not present

## 2011-07-29 DIAGNOSIS — R748 Abnormal levels of other serum enzymes: Secondary | ICD-10-CM | POA: Diagnosis not present

## 2011-07-29 LAB — RHEUMATOID FACTOR: Rhuematoid fact SerPl-aCnc: <10 IU/mL (ref <=14)

## 2011-07-29 LAB — URIC ACID: Uric Acid, Serum: 9.6 mg/dL — ABNORMAL HIGH (ref 4.0–7.8)

## 2011-07-29 LAB — CK: Total CK: 171 U/L (ref 7–232)

## 2011-07-29 MED ORDER — GABAPENTIN 300 MG PO CAPS: 300.0000 mg | ORAL_CAPSULE | Freq: Three times a day (TID) | ORAL | Status: DC

## 2011-07-29 MED ORDER — HYDROCORTISONE ACETATE 25 MG RE SUPP: 25.0000 mg | Freq: Two times a day (BID) | RECTAL | Status: AC | PRN

## 2011-07-29 NOTE — Progress Notes (Signed)
Subjective:    Patient ID: [name] [name], male    DOB: 1945-04-10, 66 y.o.   MRN: [id number]  HPI  [name] [name] is a 66 y.o. male   Hypertriglyceridemia  - hx of idiopathic cm. Started lovastatin 10mg  in 12/12  (stopped fenofibrate) as LDL 139, HDL 37.  TRigs 137.  Has had some different areas of soreness, all over- but thought due to being more active. Still taking red yeast rice and fish oil.  Checked lipids last office visit and CK level:   Results for orders placed in visit on 07/08/11  LIPID PANEL      Component Value Range   Cholesterol 185  0 - 200 (mg/dL)   Triglycerides 562 (*) <150 (mg/dL)   HDL 32 (*) >13 (mg/dL)   Total CHOL/HDL Ratio 5.8     VLDL 40  0 - 40 (mg/dL)   LDL Cholesterol 086 (*) 0 - 99 (mg/dL)  COMPREHENSIVE METABOLIC PANEL      Component Value Range   Sodium 141  135 - 145 (mEq/L)   Potassium 4.1  3.5 - 5.3 (mEq/L)   Chloride 108  96 - 112 (mEq/L)   CO2 24  19 - 32 (mEq/L)   Glucose, Bld 89  70 - 99 (mg/dL)   BUN 22  6 - 23 (mg/dL)   Creat 5.78  4.69 - 6.29 (mg/dL)   Total Bilirubin 0.5  0.3 - 1.2 (mg/dL)   Alkaline Phosphatase 56  39 - 117 (U/L)   AST 28  0 - 37 (U/L)   ALT 25  0 - 53 (U/L)   Total Protein 7.1  6.0 - 8.3 (g/dL)   Albumin 4.5  3.5 - 5.2 (g/dL)   Calcium 9.2  8.4 - 52.8 (mg/dL)  CK      Component Value Range   Total CK 455 (*) 7 - 232 (U/L)  Stopped lovastatin on 07/11/11.  No change in muscle aches.  Notices more if more active. Still taking red yeast rice, fish oil and cutting back on red meat.   Needs refill of gabapentin. Peripheral neuropathy - 600mg  as needed - up to twice per day if working on feet all day. Not everyday.  R>L hand pain - swelling in joints - longstanding, but worse if more active.  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  Uses suppositories for hemorrhoids - flair every few months - would like refill   Recent visit with Dr. Isabel Caprice - prostate numbers ok.    Review of Systems    Respiratory: Negative for chest tightness and shortness of breath.   Cardiovascular: Negative for chest pain.  Gastrointestinal: Negative for vomiting and abdominal pain.  Musculoskeletal: Positive for joint swelling.       R  Greater than L hand.       Objective:   Physical Exam  Constitutional: He is oriented to person, place, and time. He appears well-developed and well-nourished.  HENT:  Head: Normocephalic and atraumatic.  Eyes: EOM are normal. Pupils are equal, round, and reactive to light.  Neck: No JVD present. Carotid bruit is not present.  Cardiovascular: Normal rate, regular rhythm and normal heart sounds.   No murmur heard. Pulmonary/Chest: Effort normal and breath sounds normal. He has no rales.  Musculoskeletal: He exhibits no edema.       Right hand: He exhibits normal range of motion.       nodular swelling to DIP's and MCP's   Neurological: He  is alert and oriented to person, place, and time.  Skin: Skin is warm and dry.  Psychiatric: He has a normal mood and affect.      Assessment & Plan:  [name] [name] is a 66 y.o. male 1. Hyperlipidemia    2. Pain, hand joint  Rheumatoid factor, ANA, Uric Acid  3. Elevated CK  CK  4. Hemorrhoids    5. Swelling of hand joint  Rheumatoid factor, ANA, Uric Acid   Hyperlipidemia - intolerant to even low dose statin with slight elevation of CK level.  Hold on further statins.  Continue fish oil and red yeast rice as has tolerated this in the past.  Recheck lipids in 3 months as plans on exercise and diet to improve numbers.  Pain - hands with MCP and DIP swelling.  DDX gout, OA, RA.  Check Rheum factor, uric acid, ana, and possibly refer to rheumatology.  Hemorrhoids, intermittent by hx.  Rx anusol hc prn.  Peripheral neuropathy - stable. gabapentin refilled.

## 2011-08-01 LAB — ANA: Anti Nuclear Antibody(ANA): NEGATIVE

## 2011-09-02 DIAGNOSIS — D485 Neoplasm of uncertain behavior of skin: Secondary | ICD-10-CM | POA: Diagnosis not present

## 2011-09-02 DIAGNOSIS — Z85820 Personal history of malignant melanoma of skin: Secondary | ICD-10-CM | POA: Diagnosis not present

## 2011-09-02 DIAGNOSIS — D235 Other benign neoplasm of skin of trunk: Secondary | ICD-10-CM | POA: Diagnosis not present

## 2011-09-16 ENCOUNTER — Ambulatory Visit: Payer: Medicare Other | Admitting: Family Medicine

## 2011-10-03 ENCOUNTER — Ambulatory Visit (INDEPENDENT_AMBULATORY_CARE_PROVIDER_SITE_OTHER): Payer: Medicare Other | Admitting: Family Medicine

## 2011-10-03 VITALS — BP 133/70 | HR 84 | Temp 98.1°F | Resp 18 | Ht 67.25 in | Wt 197.0 lb

## 2011-10-03 DIAGNOSIS — R519 Headache, unspecified: Secondary | ICD-10-CM

## 2011-10-03 DIAGNOSIS — R21 Rash and other nonspecific skin eruption: Secondary | ICD-10-CM | POA: Diagnosis not present

## 2011-10-03 DIAGNOSIS — R51 Headache: Secondary | ICD-10-CM | POA: Diagnosis not present

## 2011-10-03 LAB — POCT SEDIMENTATION RATE: POCT SED RATE: 26 mm/hr — AB (ref 0–22)

## 2011-10-03 MED ORDER — VALACYCLOVIR HCL 1 G PO TABS: 1000.0000 mg | ORAL_TABLET | Freq: Three times a day (TID) | ORAL | Status: AC

## 2011-10-03 MED ORDER — MUPIROCIN 2 % EX OINT: TOPICAL_OINTMENT | Freq: Three times a day (TID) | CUTANEOUS | Status: AC

## 2011-10-03 MED ORDER — VALACYCLOVIR HCL 1 G PO TABS: 1000.0000 mg | ORAL_TABLET | Freq: Three times a day (TID) | ORAL | Status: DC

## 2011-10-03 MED ORDER — MUPIROCIN 2 % EX OINT: TOPICAL_OINTMENT | Freq: Three times a day (TID) | CUTANEOUS | Status: DC

## 2011-10-03 NOTE — Progress Notes (Signed)
  Subjective:    Patient ID: [name] [name], male    DOB: 09-Feb-1946, 66 y.o.   MRN: [id number]  HPI [name] [name] is a 66 y.o. male Congestion and pressure in R side of head - 3 to 4 days.  Feels like getting worse.  Feels like pressure, but not blowing anything out of nose. Hx of sinus infections in past, more sore this time.  Pain in r side of face, head, around to R side of head.  No change in hearing.  Ear and head worst.  Top of head sore. No change in vision.  No amaurosis fugax.  Tx:  Tylenol, alleve - helps some.     Review of Systems  Constitutional: Negative for fever and chills.  HENT: Negative for congestion, rhinorrhea and trouble swallowing.   Eyes: Negative for photophobia and visual disturbance.  Respiratory: Negative for cough.   Skin: Positive for rash.       Objective:   Physical Exam  Constitutional: He is oriented to person, place, and time. He appears well-developed and well-nourished.  HENT:  Head: Normocephalic and atraumatic.    Right Ear: Tympanic membrane, external ear and ear canal normal.  Left Ear: Tympanic membrane, external ear and ear canal normal.  Nose: Nose normal. Right sinus exhibits no maxillary sinus tenderness and no frontal sinus tenderness. Left sinus exhibits no maxillary sinus tenderness and no frontal sinus tenderness.  Mouth/Throat: Uvula is midline. No posterior oropharyngeal erythema.  Eyes: Conjunctivae are normal. Pupils are equal, round, and reactive to light. Right eye exhibits no discharge. Left eye exhibits no discharge.  Neck: Carotid bruit is not present. No thyromegaly present.  Pulmonary/Chest: Effort normal.  Lymphadenopathy:    He has no cervical adenopathy.  Neurological: He is alert and oriented to person, place, and time.  Skin: Skin is warm and dry. Rash noted.       No nasal rash, no periauricular rash.   Psychiatric: He has a normal mood and affect. His behavior is normal.    ttp over R scalp to temple area, but  no temporal artery nodularity or ttp.      Assessment & Plan:  [name] [name] is a 66 y.o. male 1. Right temporal headache  POCT SEDIMENTATION RATE, mupirocin ointment (BACTROBAN) 2 %, valACYclovir (VALTREX) 1000 MG tablet  2. Rash  POCT SEDIMENTATION RATE, mupirocin ointment (BACTROBAN) 2 %, valACYclovir (VALTREX) 1000 MG tablet  few erythematous patches in scalp and distribution concerning for Zoster.  Start valtrex, apply bactroban tid to rash for possible folliculitis if not Zoster.  rtc precautions given including any ocular sx's or tip of nose involvement.    10/04/11 addendum:  Results for orders placed in visit on 10/03/11  POCT SEDIMENTATION RATE      Component Value Range   POCT SED RATE 26 (*) 0 - 22 mm/hr   Sed rate does not appear to be in concerning range - unlikely temporal arteritis.  Will treat with Valtrex as above. Called patient with results.

## 2011-10-13 ENCOUNTER — Encounter: Payer: Self-pay | Admitting: Family Medicine

## 2011-10-20 ENCOUNTER — Encounter: Payer: Self-pay | Admitting: Family Medicine

## 2011-10-20 DIAGNOSIS — R972 Elevated prostate specific antigen [PSA]: Secondary | ICD-10-CM | POA: Insufficient documentation

## 2011-11-04 ENCOUNTER — Ambulatory Visit: Payer: Medicare Other | Admitting: Family Medicine

## 2012-01-02 ENCOUNTER — Ambulatory Visit (INDEPENDENT_AMBULATORY_CARE_PROVIDER_SITE_OTHER): Payer: Medicare Other | Admitting: Family Medicine

## 2012-01-02 ENCOUNTER — Encounter: Payer: Self-pay | Admitting: Family Medicine

## 2012-01-02 VITALS — BP 129/74 | HR 88 | Temp 97.2°F | Resp 16 | Ht 67.0 in | Wt 196.0 lb

## 2012-01-02 DIAGNOSIS — M79609 Pain in unspecified limb: Secondary | ICD-10-CM

## 2012-01-02 DIAGNOSIS — M2540 Effusion, unspecified joint: Secondary | ICD-10-CM | POA: Diagnosis not present

## 2012-01-02 DIAGNOSIS — E785 Hyperlipidemia, unspecified: Secondary | ICD-10-CM

## 2012-01-02 DIAGNOSIS — J029 Acute pharyngitis, unspecified: Secondary | ICD-10-CM | POA: Diagnosis not present

## 2012-01-02 DIAGNOSIS — M79643 Pain in unspecified hand: Secondary | ICD-10-CM

## 2012-01-02 LAB — POCT RAPID STREP A (OFFICE): Rapid Strep A Screen: NEGATIVE

## 2012-01-02 NOTE — Patient Instructions (Addendum)
Return to the clinic or go to the nearest emergency room if any of your symptoms worsen or new symptoms occur. Plan on recheck labs at physical next few months. We are referring you to a rheumatologist.  If any worsening of ankle pain - call or return to clinic. Tylenol ok for now.    Viral and Bacterial Pharyngitis Pharyngitis is soreness (inflammation) or infection of the pharynx. It is also called a sore throat. CAUSES  Most sore throats are caused by viruses and are part of a cold. However, some sore throats are caused by strep and other bacteria. Sore throats can also be caused by post nasal drip from draining sinuses, allergies and sometimes from sleeping with an open mouth. Infectious sore throats can be spread from person to person by coughing, sneezing and sharing cups or eating utensils. TREATMENT  Sore throats that are viral usually last 3-4 days. Viral illness will get better without medications (antibiotics). Strep throat and other bacterial infections will usually begin to get better about 24-48 hours after you begin to take antibiotics. HOME CARE INSTRUCTIONS   If the caregiver feels there is a bacterial infection or if there is a positive strep test, they will prescribe an antibiotic. The full course of antibiotics must be taken. If the full course of antibiotic is not taken, you or your child may become ill again. If you or your child has strep throat and do not finish all of the medication, serious heart or kidney diseases may develop.  Drink enough water and fluids to keep your urine clear or pale yellow.  Only take over-the-counter or prescription medicines for pain, discomfort or fever as directed by your caregiver.  Get lots of rest.  Gargle with salt water ( tsp. of salt in a glass of water) as often as every 1-2 hours as you need for comfort.  Hard candies may soothe the throat if individual is not at risk for choking. Throat sprays or lozenges may also be used. SEEK  MEDICAL CARE IF:   Large, tender lumps in the neck develop.  A rash develops.  Green, yellow-brown or bloody sputum is coughed up.  Your baby is older than 3 months with a rectal temperature of 100.5 F (38.1 C) or higher for more than 1 day. SEEK IMMEDIATE MEDICAL CARE IF:   A stiff neck develops.  You or your child are drooling or unable to swallow liquids.  You or your child are vomiting, unable to keep medications or liquids down.  You or your child has severe pain, unrelieved with recommended medications.  You or your child are having difficulty breathing (not due to stuffy nose).  You or your child are unable to fully open your mouth.  You or your child develop redness, swelling, or severe pain anywhere on the neck.  You have a fever.  Your baby is older than 3 months with a rectal temperature of 102 F (38.9 C) or higher.  Your baby is 86 months old or younger with a rectal temperature of 100.4 F (38 C) or higher. MAKE SURE YOU:   Understand these instructions.  Will watch your condition.  Will get help right away if you are not doing well or get worse. Document Released: 03/14/2005 Document Revised: 06/06/2011 Document Reviewed: 06/11/2007 [facility] Patient Information 2013 [place], [place].

## 2012-01-02 NOTE — Progress Notes (Signed)
Subjective:    Patient ID: [name] [name], male    DOB: 06/01/1945, 66 y.o.   MRN: [id number]  HPI [name] [name] is a 66 y.o. male Here for follow up.  Hyperlipidemia - intolerant to even low dose statin with slight elevation of CK level.  Held on further statins.  Continued fish oil and red yeast rice as has tolerated this in the past.  Plan to recheck lipids after exercise and diet to improve numbers. Walking for exercise. No t much diet change.  Fasting except for coffee with sugar at 9:30 and lozenges.  07/08/11 labs: trig 201, hdl 32, ldl 113.  CK 455 on April 12th.  Sore throat past week, not feeling any better. Had fever first few days - subjective.  no congestion or runny nose.  No known sick contacts.  Tx: tylenol and otc cold medicine. Initially worse for 3 days.  Headache and muscle aches initially, now just sore throat.  .  No congestion, runny nose.    Hemorrhoids, intermittent by hx.  Rx anusol hc prn - too expensive, didn;t fill. Has few left if needed. . No recent problems.  Still   Peripheral neuropathy - on gabapentin up to 600mg  BID if needed - not taken everyday.  No recent changes.   Hx of elevated PSA - followed by Dr. Isabel Caprice. Stable, follow up scheduled for 1 year.   Arthralgias - with episodic swelling in joints, noticed  R ankle swelling past few days. - min to no pain, only with movement. CK 171, ANA negative, rheum factor less than 10, uric acid 9.6 on 07/29/11 labs.  Hx of idiopathic cardiomyopathy.   Review of Systems  Constitutional: Positive for fever (initially only.). Negative for fatigue and unexpected weight change.  HENT: Positive for sore throat. Negative for congestion, rhinorrhea and postnasal drip.   Eyes: Negative for visual disturbance.  Respiratory: Negative for cough, chest tightness and shortness of breath.   Cardiovascular: Negative for chest pain and palpitations.  Gastrointestinal: Negative for abdominal pain and blood in stool.  Musculoskeletal:  Positive for joint swelling.  Neurological: Negative for dizziness, light-headedness and headaches.   As above.      Objective:   Physical Exam  Vitals reviewed. Constitutional: He is oriented to person, place, and time. He appears well-developed and well-nourished.  HENT:  Head: Normocephalic and atraumatic.  Right Ear: Tympanic membrane, external ear and ear canal normal.  Left Ear: Tympanic membrane, external ear and ear canal normal.  Nose: No rhinorrhea.  Mouth/Throat: Oropharynx is clear and moist and mucous membranes are normal. No oropharyngeal exudate or posterior oropharyngeal erythema.       Small amt cerumen l canal.   Eyes: Conjunctivae normal and EOM are normal. Pupils are equal, round, and reactive to light.  Neck: Neck supple. No JVD present. Carotid bruit is not present.  Cardiovascular: Normal rate, regular rhythm, normal heart sounds and intact distal pulses.   No murmur heard. Pulmonary/Chest: Effort normal and breath sounds normal. He has no wheezes. He has no rhonchi. He has no rales.  Abdominal: Soft. There is no tenderness.  Musculoskeletal:       Arms:      Feet:  Lymphadenopathy:    He has no cervical adenopathy.  Neurological: He is alert and oriented to person, place, and time.  Skin: Skin is warm and dry. No rash noted.  Psychiatric: He has a normal mood and affect. His behavior is normal.   Results for orders  placed in visit on 01/02/12  POCT RAPID STREP A (OFFICE)      Component Value Range   Rapid Strep A Screen Negative  Negative       Assessment & Plan:  [name] [name] is a 66 y.o. male 1. Pharyngitis  POCT rapid strep A  2. Hyperlipidemia    3. Swelling of multiple joints  Ambulatory referral to Rheumatology  4. Hand pain  Ambulatory referral to Rheumatology   Lipids: Plan on recheck lipids in next few months as borderline prior, and not truly fasting today. Intolerant to statins.  Pharyngitis.  likley viral,  And should be improving  next few days.  sx care per h/o, and  rtc precautions.  Delaying flu shot for 1 week.  Arthralgias, swelling of joints.  Possible gout, but hesitant to use NSAID with hx of idiopathic CM.  Start with tylenol,  Will refer to Rheum.  If stronger med needed for ankle, ok to call in ultram if no contraindication.  Other problems stable.  Plan on CPE in next few months.

## 2012-01-06 ENCOUNTER — Ambulatory Visit: Payer: Medicare Other | Admitting: Family Medicine

## 2012-01-18 ENCOUNTER — Ambulatory Visit (INDEPENDENT_AMBULATORY_CARE_PROVIDER_SITE_OTHER): Payer: Medicare Other | Admitting: Family Medicine

## 2012-01-18 DIAGNOSIS — Z23 Encounter for immunization: Secondary | ICD-10-CM | POA: Diagnosis not present

## 2012-02-02 DIAGNOSIS — M25569 Pain in unspecified knee: Secondary | ICD-10-CM | POA: Diagnosis not present

## 2012-02-02 DIAGNOSIS — M2550 Pain in unspecified joint: Secondary | ICD-10-CM | POA: Diagnosis not present

## 2012-02-02 DIAGNOSIS — M25559 Pain in unspecified hip: Secondary | ICD-10-CM | POA: Diagnosis not present

## 2012-02-02 DIAGNOSIS — M25549 Pain in joints of unspecified hand: Secondary | ICD-10-CM | POA: Diagnosis not present

## 2012-02-02 DIAGNOSIS — Z79899 Other long term (current) drug therapy: Secondary | ICD-10-CM | POA: Diagnosis not present

## 2012-02-02 DIAGNOSIS — R5381 Other malaise: Secondary | ICD-10-CM | POA: Diagnosis not present

## 2012-02-02 DIAGNOSIS — M5137 Other intervertebral disc degeneration, lumbosacral region: Secondary | ICD-10-CM | POA: Diagnosis not present

## 2012-02-02 DIAGNOSIS — E559 Vitamin D deficiency, unspecified: Secondary | ICD-10-CM | POA: Diagnosis not present

## 2012-02-21 ENCOUNTER — Other Ambulatory Visit: Payer: Self-pay | Admitting: Surgery

## 2012-02-21 DIAGNOSIS — D485 Neoplasm of uncertain behavior of skin: Secondary | ICD-10-CM | POA: Diagnosis not present

## 2012-02-21 DIAGNOSIS — Z85820 Personal history of malignant melanoma of skin: Secondary | ICD-10-CM | POA: Diagnosis not present

## 2012-02-21 DIAGNOSIS — D235 Other benign neoplasm of skin of trunk: Secondary | ICD-10-CM | POA: Diagnosis not present

## 2012-02-21 DIAGNOSIS — L821 Other seborrheic keratosis: Secondary | ICD-10-CM | POA: Diagnosis not present

## 2012-02-28 ENCOUNTER — Ambulatory Visit (INDEPENDENT_AMBULATORY_CARE_PROVIDER_SITE_OTHER): Payer: Medicare Other | Admitting: Family Medicine

## 2012-02-28 VITALS — BP 119/73 | HR 73 | Temp 97.4°F | Resp 18 | Ht 68.0 in | Wt 205.0 lb

## 2012-02-28 DIAGNOSIS — Z0000 Encounter for general adult medical examination without abnormal findings: Secondary | ICD-10-CM | POA: Diagnosis not present

## 2012-02-28 DIAGNOSIS — H9190 Unspecified hearing loss, unspecified ear: Secondary | ICD-10-CM

## 2012-02-28 DIAGNOSIS — E782 Mixed hyperlipidemia: Secondary | ICD-10-CM | POA: Diagnosis not present

## 2012-02-28 DIAGNOSIS — E785 Hyperlipidemia, unspecified: Secondary | ICD-10-CM

## 2012-02-28 LAB — COMPREHENSIVE METABOLIC PANEL
ALT: 20 U/L (ref 0–53)
AST: 17 U/L (ref 0–37)
Albumin: 4.6 g/dL (ref 3.5–5.2)
Alkaline Phosphatase: 50 U/L (ref 39–117)
BUN: 24 mg/dL — ABNORMAL HIGH (ref 6–23)
CO2: 27 mEq/L (ref 19–32)
Calcium: 9.4 mg/dL (ref 8.4–10.5)
Chloride: 104 mEq/L (ref 96–112)
Creat: 0.9 mg/dL (ref 0.50–1.35)
Glucose, Bld: 106 mg/dL — ABNORMAL HIGH (ref 70–99)
Potassium: 4.6 mEq/L (ref 3.5–5.3)
Sodium: 139 mEq/L (ref 135–145)
Total Bilirubin: 0.4 mg/dL (ref 0.3–1.2)
Total Protein: 7.4 g/dL (ref 6.0–8.3)

## 2012-02-28 LAB — LIPID PANEL
Cholesterol: 255 mg/dL — ABNORMAL HIGH (ref 0–200)
HDL: 39 mg/dL — ABNORMAL LOW (ref >39)
LDL Cholesterol: 176 mg/dL — ABNORMAL HIGH (ref 0–99)
Total CHOL/HDL Ratio: 6.5 Ratio
Triglycerides: 201 mg/dL — ABNORMAL HIGH (ref <150)
VLDL: 40 mg/dL (ref 0–40)

## 2012-02-28 NOTE — Progress Notes (Signed)
Subjective:    Patient ID: [name] [name], male    DOB: [birth year], 66 y.o.   MRN: [id number]  HPI [name] [name] is a 66 y.o. male  Here for CPE.  No refills of meds needed. Last saw optho a year and a half ago. Pneumovax 2/11. Flu vaccine 10/13. tdap 5/11. Colonoscopy in 2009 - plan on follow up next year (5 year interval).  Has not had Zostavax, but shingles   Hyperlipidemia - intolerant to even low dose statin with slight elevation of CK level.  Held on further statins.  Continued fish oil and red yeast rice as has tolerated this in the past.  Fasting today.  Walking for exercise. Not changing diet much.    Peripheral neuropathy - on gabapentin up to 600mg  BID if needed - not taken everyday.  No recent changes.   Hx of elevated PSA - followed by Dr. Isabel Caprice. Stable for 1 year this past spring.   Arthralgias - with episodic swelling in joints - referred to RHeumatologist - D. Deveshwar.  Working up for arthritis.     Hx of idiopathic cardiomyopathy. [facility] - prior pt of Dr. Clarene Duke. Has yearly follow up. Will schedule appt next year.    \Review of Systems  HENT: Positive for hearing loss.   longstanding with loud noise exposure prior.  13pt ros on PHS.  No new symptoms.  Eye itching at times - has otc drops that help with this.     Objective:   Physical Exam  Constitutional: He is oriented to person, place, and time. He appears well-developed and well-nourished.  HENT:  Head: Normocephalic and atraumatic.  Right Ear: External ear normal.  Left Ear: External ear normal.  Mouth/Throat: Oropharynx is clear and moist.       Small amount nonobstructive cerumen bilaterally.   Eyes: Conjunctivae normal and EOM are normal. Pupils are equal, round, and reactive to light.  Neck: Normal range of motion. Neck supple. No thyromegaly present.  Cardiovascular: Normal rate, regular rhythm, normal heart sounds and intact distal pulses.   Pulmonary/Chest: Effort normal and breath sounds normal. No  respiratory distress. He has no wheezes.  Abdominal: Soft. He exhibits no distension. There is no tenderness. Hernia confirmed negative in the right inguinal area and confirmed negative in the left inguinal area.  Musculoskeletal: Normal range of motion. He exhibits no edema and no tenderness.  Lymphadenopathy:    He has no cervical adenopathy.  Neurological: He is alert and oriented to person, place, and time. He has normal reflexes.  Skin: Skin is warm and dry.  Psychiatric: He has a normal mood and affect. His behavior is normal.          Assessment & Plan:   [name] [name] is a 66 y.o. male 1. Hyperlipidemia  Comprehensive metabolic panel, Lipid panel  2. Hearing difficulty    3. Annual physical exam      CPE - annual wellness exam. Depression scale - 0. Discussed vision and hearing as below.   Vision reviewed - needs to use glasses to drive - instructed to follow up with eye care professional - once per year needed.  Up to date on flu vaccine, pneumovax, colonoscopy - will need repeat nex t year - he will schedule.  Discussed zostavax - declined today. likely had zoster earlier this year, but still declined vaccine at this point.   Hearing difficulty - small amount of wax in ear, but non occlusive. . Discussed audiometry or referral  to audiologist - refused to wear hearing aid, so declined referral at this point. Advised to rtc if and when he is ready to get evaluated for the hearing difficulty.   Hyperlipidemia - check lipids, cmp.

## 2012-02-28 NOTE — Patient Instructions (Signed)
Your should receive a call or letter about your lab results within the next week to 10 days. Call your GI doctor to schedule the colonoscopy, follow up with your eye doctor - wear glasses to drive. If you would like further evaluation of your hearing difficulty- return to clinic.  Keeping you healthy  Get these tests  Blood pressure- Have your blood pressure checked once a year by your healthcare provider.  Normal blood pressure is 120/80  Weight- Have your body mass index (BMI) calculated to screen for obesity.  BMI is a measure of body fat based on height and weight. You can also calculate your own BMI at [email or web address].  Cholesterol- Have your cholesterol checked every year.  Diabetes- Have your blood sugar checked regularly if you have high blood pressure, high cholesterol, have a family history of diabetes or if you are overweight.  Screening for Colon Cancer- Colonoscopy starting at age 29.  Screening may begin sooner depending on your family history and other health conditions. Follow up colonoscopy as directed by your Gastroenterologist.  Screening for Prostate Cancer- Both blood work (PSA) and a rectal exam help screen for Prostate Cancer.  Screening begins at age 2 with African-American men and at age 25 with Caucasian men.  Screening may begin sooner depending on your family history.  Take these medicines  Aspirin- One aspirin daily can help prevent Heart disease and Stroke.  Flu shot- Every fall.  Tetanus- Every 10 years.  Zostavax- Once after the age of 56 to prevent Shingles.  Pneumonia shot- Once after the age of 37; if you are younger than 50, ask your healthcare provider if you need a Pneumonia shot.  Take these steps  Don't smoke- If you do smoke, talk to your doctor about quitting.  For tips on how to quit, go to www.smokefree.gov or call 1-800-QUIT-NOW.  Be physically active- Exercise 5 days a week for at least 30 minutes.  If you are not already  physically active start slow and gradually work up to 30 minutes of moderate physical activity.  Examples of moderate activity include walking briskly, mowing the yard, dancing, swimming, bicycling, etc.  Eat a healthy diet- Eat a variety of healthy food such as fruits, vegetables, low fat milk, low fat cheese, yogurt, lean meant, poultry, fish, beans, tofu, etc. For more information go to www.thenutritionsource.org  Drink alcohol in moderation- Limit alcohol intake to less than two drinks a day. Never drink and drive.  Dentist- Brush and floss twice daily; visit your dentist twice a year.  Depression- Your emotional health is as important as your physical health. If you're feeling down, or losing interest in things you would normally enjoy please talk to your healthcare provider.  Eye exam- Visit your eye doctor every year.  Safe sex- If you may be exposed to a sexually transmitted infection, use a condom.  Seat belts- Seat belts can save your life; always wear one.  Smoke/Carbon Monoxide detectors- These detectors need to be installed on the appropriate level of your home.  Replace batteries at least once a year.  Skin cancer- When out in the sun, cover up and use sunscreen 15 SPF or higher.  Violence- If anyone is threatening you, please tell your healthcare provider.  Living Will/ Health care power of attorney- Speak with your healthcare provider and family.

## 2012-03-02 ENCOUNTER — Ambulatory Visit
Admission: RE | Admit: 2012-03-02 | Discharge: 2012-03-02 | Disposition: A | Discharge status: Home / Self Care | Payer: Medicare Other | Source: Ambulatory Visit | Attending: Rheumatology | Admitting: Rheumatology

## 2012-03-02 ENCOUNTER — Other Ambulatory Visit: Payer: Self-pay | Admitting: Rheumatology

## 2012-03-02 DIAGNOSIS — M25559 Pain in unspecified hip: Secondary | ICD-10-CM | POA: Diagnosis not present

## 2012-03-02 DIAGNOSIS — D869 Sarcoidosis, unspecified: Secondary | ICD-10-CM | POA: Diagnosis not present

## 2012-03-02 DIAGNOSIS — R799 Abnormal finding of blood chemistry, unspecified: Secondary | ICD-10-CM

## 2012-03-02 DIAGNOSIS — M5137 Other intervertebral disc degeneration, lumbosacral region: Secondary | ICD-10-CM | POA: Diagnosis not present

## 2012-03-02 DIAGNOSIS — M1710 Unilateral primary osteoarthritis, unspecified knee: Secondary | ICD-10-CM | POA: Diagnosis not present

## 2012-03-19 ENCOUNTER — Encounter: Payer: Medicare Other | Admitting: Family Medicine

## 2012-04-18 ENCOUNTER — Encounter: Payer: Self-pay | Admitting: *Deleted

## 2012-04-27 ENCOUNTER — Encounter: Payer: Self-pay | Admitting: Gastroenterology

## 2012-04-27 ENCOUNTER — Ambulatory Visit: Payer: Medicare Other | Admitting: Gastroenterology

## 2012-04-27 ENCOUNTER — Ambulatory Visit (INDEPENDENT_AMBULATORY_CARE_PROVIDER_SITE_OTHER): Payer: Medicare Other | Admitting: Gastroenterology

## 2012-04-27 VITALS — BP 128/68 | HR 92 | Ht 68.0 in | Wt 204.8 lb

## 2012-04-27 DIAGNOSIS — D126 Benign neoplasm of colon, unspecified: Secondary | ICD-10-CM | POA: Diagnosis not present

## 2012-04-27 DIAGNOSIS — K5900 Constipation, unspecified: Secondary | ICD-10-CM | POA: Diagnosis not present

## 2012-04-27 MED ORDER — PEG-KCL-NACL-NASULF-NA ASC-C 100 G PO SOLR: 1.0000 | Freq: Once | ORAL | Status: DC

## 2012-04-27 NOTE — Patient Instructions (Addendum)
Please start taking citrucel (orange flavored) powder fiber supplement.  This may cause some bloating at first but that usually goes away. Begin with a small spoonful and work your way up to a large, heaping spoonful daily over a week. This could help your constipation and hemorrhoids. You will be set up for a colonoscopy for polyp surveillance (moderate sedation, LEC).

## 2012-04-27 NOTE — Progress Notes (Signed)
Review of pertinent gastrointestinal problems: 1. Tubular adenoma, small. Removed 04/2007 colonoscopy. Recall 5 year interval 2. Left sided tics, internal hems also noted on above 2009 colonoscopy.    HPI: This is a  very pleasant 67 year old man whom I last saw about 5 years ago at the time of colonoscopy. See those results summarized above.  Was here for "check up."  Has intermittent difficulty with hemorrhoids, uses suppositories.  Can have trouble with constipation.  Overall his weight has been stable, his bowels are essentially unchanged.    Past Medical History  Diagnosis Date  . Arthritis   . CHF (congestive heart failure)   . Chest pain     stress test 10/05/98; cath 2004; cardiac CT 03/2008-no significant disease, nl EF  . Hyperlipemia   . SOB (shortness of breath)     2D echo 09/04/2009-EF 40-45%,   . Abnormal stress test 10/05/98    mild anteroseptal ischemia at the apex, EF 42%    Past Surgical History  Procedure Date  . Appendectomy   . Lumbar spine 08/2000  . Back surgery   . Cardiac catheterization 05/23/2002    minimal irregularities in the LAD, EF 50%  . 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 gabapentin (NEURONTIN) 300 MG capsule Take 300 mg by mouth as needed.      . lovastatin (MEVACOR) 10 MG tablet Take 1 tablet (10 mg total) by mouth at bedtime.  90 tablet  1  . sildenafil (VIAGRA) 100 MG tablet 1/2 to 1 tablet up to each day as needed.  10 tablet  5  . valACYclovir (VALTREX) 1000 MG tablet Take 1 tablet (1,000 mg total) by mouth 3 (three) times daily.  21 tablet  0    Allergies as of 04/27/2012 - Review Complete 04/27/2012  Allergen Reaction Noted  . Nitroglycerin Anaphylaxis 07/08/2011  . Ace inhibitors Swelling 07/08/2011    Family History  Problem Relation Age of Onset  . Cardiomyopathy      idiopathic. trivial disease 2004 cath,  2010 cardiac CT - no sig. dz, nl LV fxn. cards: Little  . Arthritis Mother   . COPD Father   . Cancer Father     lung cancer  . Cancer Sister     History   Social History  . Marital Status: Widowed    Spouse Name: N/A    Number of Children: N/A  . Years of Education: N/A   Occupational History  . Not on file.   Social History Main Topics  . Smoking status: Former Smoker    Quit date: 03/28/1980  . Smokeless tobacco: Never Used  . Alcohol Use: No  . Drug Use: No  . Sexually Active: Yes     Comment: significate other   Other Topics Concern  . Not on file   Social History Narrative  . No narrative on file      Physical Exam: BP 128/68  Pulse 92  Ht 5\' 8"  (1.727 m)  Wt 204 lb 12.8 oz (92.897 kg)  BMI 31.14 kg/m2 Constitutional: generally well-appearing Psychiatric: alert and oriented x3 Abdomen: soft, nontender, nondistended, no obvious ascites, no peritoneal signs, normal bowel sounds     Assessment and plan: 67 y.o. male with mild intermittent constipation, intermittent hemorrhoids, personal history of adenomatous colon polyps  He'll try fiber supplements on a daily basis to see if that can help his  constipation and hemorrhoid flares. We will schedule repeat colonoscopy for surveillance at his soonest convenience.

## 2012-05-07 ENCOUNTER — Ambulatory Visit (AMBULATORY_SURGERY_CENTER): Payer: Medicare Other | Admitting: Gastroenterology

## 2012-05-07 ENCOUNTER — Other Ambulatory Visit: Payer: Medicare Other | Admitting: Gastroenterology

## 2012-05-07 ENCOUNTER — Encounter: Payer: Self-pay | Admitting: Gastroenterology

## 2012-05-07 VITALS — BP 122/80 | HR 76 | Temp 96.0°F | Resp 15 | Ht 68.0 in | Wt 204.0 lb

## 2012-05-07 DIAGNOSIS — K5730 Diverticulosis of large intestine without perforation or abscess without bleeding: Secondary | ICD-10-CM

## 2012-05-07 DIAGNOSIS — D126 Benign neoplasm of colon, unspecified: Secondary | ICD-10-CM | POA: Diagnosis not present

## 2012-05-07 DIAGNOSIS — Z860100 Personal history of colon polyps, unspecified: Secondary | ICD-10-CM

## 2012-05-07 DIAGNOSIS — K5900 Constipation, unspecified: Secondary | ICD-10-CM

## 2012-05-07 MED ORDER — SODIUM CHLORIDE 0.9 % IV SOLN: 500.0000 mL | INTRAVENOUS | Status: DC

## 2012-05-07 NOTE — Patient Instructions (Signed)
YOU HAD AN ENDOSCOPIC PROCEDURE TODAY AT THE [facility]: Refer to the procedure report that was given to you for any specific questions about what was found during the examination.  If the procedure report does not answer your questions, please call your gastroenterologist to clarify.  If you requested that your care partner not be given the details of your procedure findings, then the procedure report has been included in a sealed envelope for you to review at your convenience later.  YOU SHOULD EXPECT: Some feelings of bloating in the abdomen. Passage of more gas than usual.  Walking can help get rid of the air that was put into your GI tract during the procedure and reduce the bloating. If you had a lower endoscopy (such as a colonoscopy or flexible sigmoidoscopy) you may notice spotting of blood in your stool or on the toilet paper. If you underwent a bowel prep for your procedure, then you may not have a normal bowel movement for a few days.  DIET: Your first meal following the procedure should be a light meal and then it is ok to progress to your normal diet.  A half-sandwich or bowl of soup is an example of a good first meal.  Heavy or fried foods are harder to digest and may make you feel nauseous or bloated.  Likewise meals heavy in dairy and vegetables can cause extra gas to form and this can also increase the bloating.  Drink plenty of fluids but you should avoid alcoholic beverages for 24 hours.  ACTIVITY: Your care partner should take you home directly after the procedure.  You should plan to take it easy, moving slowly for the rest of the day.  You can resume normal activity the day after the procedure however you should NOT DRIVE or use heavy machinery for 24 hours (because of the sedation medicines used during the test).    SYMPTOMS TO REPORT IMMEDIATELY: A gastroenterologist can be reached at any hour.  During normal business hours, 8:30 AM to 5:00 PM Monday through Friday,  call [phone].  After hours and on weekends, please call the GI answering service at [phone] who will take a message and have the physician on call contact you.   Following lower endoscopy (colonoscopy or flexible sigmoidoscopy):  Excessive amounts of blood in the stool  Significant tenderness or worsening of abdominal pains  Swelling of the abdomen that is new, acute  Fever of 100F or higher    FOLLOW UP: If any biopsies were taken you will be contacted by phone or by letter within the next 1-3 weeks.  Call your gastroenterologist if you have not heard about the biopsies in 3 weeks.  Our staff will call the home number listed on your records the next business day following your procedure to check on you and address any questions or concerns that you may have at that time regarding the information given to you following your procedure. This is a courtesy call and so if there is no answer at the home number and we have not heard from you through the emergency physician on call, we will assume that you have returned to your regular daily activities without incident.  SIGNATURES/CONFIDENTIALITY: You and/or your care partner have signed paperwork which will be entered into your electronic medical record.  These signatures attest to the fact that that the information above on your After Visit Summary has been reviewed and is understood.  Full responsibility of the confidentiality   of this discharge information lies with you and/or your care-partner.     

## 2012-05-07 NOTE — Progress Notes (Signed)
Patient did not experience any of the following events: a burn prior to discharge; a fall within the facility; wrong site/side/patient/procedure/implant event; or a hospital transfer or hospital admission upon discharge from the facility. (G8907) Patient did not have preoperative order for IV antibiotic SSI prophylaxis. (G8918)  

## 2012-05-07 NOTE — Op Note (Signed)
[facility] [address] [place], [address]   COLONOSCOPY PROCEDURE REPORT  PATIENT: [name], [name]  MR#: [id number] BIRTHDATE: October 29, 1945 , 67  yrs. old GENDER: Male ENDOSCOPIST: Rachael Fee, MD PROCEDURE DATE:  05/07/2012 PROCEDURE:   Colonoscopy with snare polypectomy ASA CLASS:   Class III INDICATIONS:small adenoma removed 2009. MEDICATIONS: Fentanyl 50 mcg IV, Versed 7 mg IV, and These medications were titrated to patient response per physician's verbal order  DESCRIPTION OF PROCEDURE:   After the risks benefits and alternatives of the procedure were thoroughly explained, informed consent was obtained.  A digital rectal exam revealed no abnormalities of the rectum.   The LB [id number]  endoscope was introduced through the anus and advanced to the cecum, which was identified by both the appendix and ileocecal valve. No adverse events experienced.   The quality of the prep was good, using MoviPrep  The instrument was then slowly withdrawn as the colon was fully examined.  COLON FINDINGS: Four polyps were found, removed and all were sent to pathology.  These were all sessile, ranging in size from 3mm to 6mm, located in cecum, ascending, sigmoid and rectum, all removed with cold snare and all sent to pathology (jar 1).  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  The examination was otherwise normal.  Retroflexed views revealed no abnormalities. The time to cecum=2 minutes 03 seconds.  Withdrawal time=11 minutes 21 seconds. The scope was withdrawn and the procedure completed. COMPLICATIONS: There were no complications.  ENDOSCOPIC IMPRESSION: Four polyps were found, removed and all were sent to pathology. 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 The examination was otherwise normal.  RECOMMENDATIONS: If the polyp(s) removed today are proven to be adenomatous (pre-cancerous) polyps, you will need a colonoscopy in 3 years. Otherwise  you should continue to follow colorectal cancer screening guidelines for "routine risk" patients with a colonoscopy in 10 years.  You will receive a letter within 1-2 weeks with the results of your biopsy as well as final recommendations.  Please call my office if you have not received a letter after 3 weeks.   eSigned:  Rachael Fee, MD 05/07/2012 9:10 AM   cc: Nila Nephew, MD

## 2012-05-08 ENCOUNTER — Telehealth: Payer: Self-pay

## 2012-05-08 NOTE — Telephone Encounter (Signed)
  Follow up Call-  Call back number 05/07/2012  Post procedure Call Back phone  # [phone]  Permission to leave phone message Yes     Patient questions:  Do you have a fever, pain , or abdominal swelling? no Pain Score  0 *  Have you tolerated food without any problems? yes  Have you been able to return to your normal activities? yes  Do you have any questions about your discharge instructions: Diet   no Medications  no Follow up visit  no  Do you have questions or concerns about your Care? no  Actions: * If pain score is 4 or above: No action needed, pain <4.

## 2012-05-22 ENCOUNTER — Encounter: Payer: Self-pay | Admitting: Gastroenterology

## 2012-06-21 DIAGNOSIS — E782 Mixed hyperlipidemia: Secondary | ICD-10-CM | POA: Diagnosis not present

## 2012-06-21 DIAGNOSIS — R0602 Shortness of breath: Secondary | ICD-10-CM | POA: Diagnosis not present

## 2012-06-25 DIAGNOSIS — R0602 Shortness of breath: Secondary | ICD-10-CM | POA: Diagnosis not present

## 2012-06-26 DIAGNOSIS — R0602 Shortness of breath: Secondary | ICD-10-CM | POA: Insufficient documentation

## 2012-07-16 DIAGNOSIS — R972 Elevated prostate specific antigen [PSA]: Secondary | ICD-10-CM | POA: Diagnosis not present

## 2012-07-17 DIAGNOSIS — Z85820 Personal history of malignant melanoma of skin: Secondary | ICD-10-CM | POA: Diagnosis not present

## 2012-07-17 DIAGNOSIS — D235 Other benign neoplasm of skin of trunk: Secondary | ICD-10-CM | POA: Diagnosis not present

## 2012-07-23 DIAGNOSIS — M1710 Unilateral primary osteoarthritis, unspecified knee: Secondary | ICD-10-CM | POA: Diagnosis not present

## 2012-07-23 DIAGNOSIS — N401 Enlarged prostate with lower urinary tract symptoms: Secondary | ICD-10-CM | POA: Diagnosis not present

## 2012-07-23 DIAGNOSIS — R972 Elevated prostate specific antigen [PSA]: Secondary | ICD-10-CM | POA: Diagnosis not present

## 2012-07-30 DIAGNOSIS — M1710 Unilateral primary osteoarthritis, unspecified knee: Secondary | ICD-10-CM | POA: Diagnosis not present

## 2012-08-06 DIAGNOSIS — M1710 Unilateral primary osteoarthritis, unspecified knee: Secondary | ICD-10-CM | POA: Diagnosis not present

## 2012-08-14 ENCOUNTER — Encounter: Payer: Self-pay | Admitting: Cardiology

## 2012-08-15 ENCOUNTER — Ambulatory Visit (INDEPENDENT_AMBULATORY_CARE_PROVIDER_SITE_OTHER): Payer: Medicare Other | Admitting: Cardiology

## 2012-08-15 ENCOUNTER — Encounter: Payer: Self-pay | Admitting: Cardiology

## 2012-08-15 VITALS — BP 128/64 | HR 92 | Ht 67.0 in | Wt 209.0 lb

## 2012-08-15 DIAGNOSIS — E781 Pure hyperglyceridemia: Secondary | ICD-10-CM

## 2012-08-15 DIAGNOSIS — I428 Other cardiomyopathies: Secondary | ICD-10-CM | POA: Diagnosis not present

## 2012-08-15 DIAGNOSIS — R0602 Shortness of breath: Secondary | ICD-10-CM | POA: Diagnosis not present

## 2012-08-15 NOTE — Assessment & Plan Note (Addendum)
No CHF symptoms of PND/orthopnea or edema.  Stable blood pressure and heart rate. No need for diuretic.

## 2012-08-15 NOTE — Progress Notes (Signed)
[facility]  Clinic Note:  Patient ID: [name] [name], male   DOB: 03/24/1946, 67 y.o.   MRN: [id number]  HPI: [name] [name] is a 67 y.o. male with a PMH below who presents today for followup from his CPET test.  Interval History: Mr. Massmann had a CPET test to evaluate exertional shortness of breath and fatigue. He has a history of moderately reduced EF that was due to nonischemic cardiomyopathy. He also a distant history of smoking but quit in 1982. He really says his symptoms are pretty much back to normal and his baseline he is back to work now. He thinks it is knees bothering him when he walks his baby was making him feel more winded for a brief or just due to the discomfort. Otherwise he denies any significant cardiac symptoms with a Cardiovascular ROS as follows: positive for - dyspnea on exertion and This is much improved negative for - chest pain, edema, irregular heartbeat, loss of consciousness, murmur, orthopnea, palpitations, paroxysmal nocturnal dyspnea, rapid heart rate or shortness of breath Additional cardiac review of systems: Lightheadedness - no, dizzinesscno, syncope/near-syncope - no; TIA/amaurosis fugax - no Melena - no, hematochezia no; hematuria - no; nosebleeds - no; claudication - no  Results of CPET test: VO2 (Oxygen Consumption) -- 107%, excellent. Despite not quite able to reach target exercise level. His heart rate did have adequate increase with a near parallel increase of heart rate and cardiac output. Pulmonary function tests are also normal.  Please see scanned report.   Allergies  Allergen Reactions  . Nitroglycerin Anaphylaxis    Cardiologist suggested he shouldn't take this med because it could kill him with his heart condition. Lowers BP  . Ace Inhibitors Swelling  . Lovastatin Nausea Only    Current Outpatient Prescriptions  Medication Sig Dispense Refill  . Cyanocobalamin (B-12 SL) Place 1 tablet under the tongue daily.       . fish oil-omega-3 fatty acids 1000 MG capsule Take 1 g by mouth daily.      Marland Kitchen gabapentin (NEURONTIN) 300 MG capsule Take 300 mg by mouth as needed.      . Garlic TABS Take 1 tablet by mouth daily.      . Misc Natural Products (SAW PALMETTO) CAPS Take 1 capsule by mouth daily.      . Multiple Vitamins-Minerals (MULTIVITAMIN PO) Take 1 tablet by mouth daily.      . Pomegranate, Punica granatum, (POMEGRANATE PO) Take 1 capsule by mouth daily.      . Red Yeast Rice Extract (RED YEAST RICE PO) Take 1 capsule by mouth daily.      . sildenafil (VIAGRA) 100 MG tablet 1/2 to 1 tablet up to each day as needed.  10 tablet  5  . valACYclovir (VALTREX) 1000 MG tablet Take 1 tablet (1,000 mg total) by mouth 3 (three) times daily.  21 tablet  0   No current facility-administered medications for this visit.   he uses pomegranate for aches and pains  Past Medical History  Diagnosis Date  . Arthritis   . Nonischemic cardiomyopathy  09/04/2009    2D echo-EF 40-45%, Global Hypokinesis  . Chest pain with low risk for cardiac etiology     stress test 10/05/98; cath 2004; cardiac CT 03/2008-no significant disease, nl EF  . Hyperlipemia   . Exertional shortness of breath 06/2012    CPET test -   . Abnormal stress test 10/05/98    mild anteroseptal  ischemia at the apex, EF 42%  . Borderline hypertension     Past Surgical History  Procedure Laterality Date  . Appendectomy    . Lumbar spine  08/2000  . Back surgery    . Cardiac catheterization  05/23/2002    minimal irregularities in the LAD, EF 50%    History   Social History  . Marital Status: Widowed    Spouse Name: N/A    Number of Children: N/A  . Years of Education: N/A   Occupational History  . 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   Social History Main Topics  . Smoking status: Former Smoker -- 2.00 packs/day for 20 years    Types: Cigarettes    Quit date: 03/28/1980  . Smokeless tobacco: Never  Used  . Alcohol Use: No  . Drug Use: No  . Sexually Active: Yes     Comment: significate other   Other Topics Concern  . Not on file   Social History Narrative   He walks on a relatively regular basis about 15 minutes at a time mostly to the discomfort. He previously had walked up a 30-40 minutes a time without problems.  Mostly limited by knees.  ROS: A comprehensive Review of Systems - Negative except Positives noted above and below General ROS: positive for  - sleep disturbance and Very poor sleep wall at home alone. She maybe gets 2-3 hours of sleep a night. Simply unable to get to sleep. Does seem like he is a program out of sleep without feeling rested. As more of just not getting enough sleep. He does however get sleep when he spent the night over with his girlfriend. Musculoskeletal ROS: positive for - joint pain, joint stiffness and pain in knee - both sides  PHYSICAL EXAM BP 128/64  Pulse 92  Ht 5\' 7"  (1.702 m)  Wt 209 lb (94.802 kg)  BMI 32.73 kg/m2 General appearance: alert, cooperative, appears stated age, no distress and Pleasant mood and affect Neck: no adenopathy, no carotid bruit, no JVD, supple, symmetrical, trachea midline and thyroid not enlarged, symmetric, no tenderness/mass/nodules Lungs: clear to auscultation bilaterally, normal percussion bilaterally and Nonlabored Heart: regular rate and rhythm, S1, S2 normal, no murmur, click, rub or gallop and normal apical impulse Abdomen: soft, non-tender; bowel sounds normal; no masses,  no organomegaly Extremities: extremities normal, atraumatic, no cyanosis or edema Pulses: 2+ and symmetric  [id number] today: No  ASSESSMENT: Exertional shortness of breath  Hypertriglyceridemia - Plan: Lipid Profile, Comp Met (CMET)  Nonischemic cardiomyopathy - Plan: Comp Met (CMET)  PLAN: Per Problem List.  Continue daily exercise. Followup in 1 year.   [name], M.D., M.S. [facility] [address] [place], [place]  [address]  [phone] Pager # [phone] 08/16/2012 12:01 AM

## 2012-08-15 NOTE — Assessment & Plan Note (Signed)
Check Lipid panel 

## 2012-08-15 NOTE — Assessment & Plan Note (Signed)
Did outstanding on CPET - VO2 107%,  Low risk. Anticipate dyspnea is related to knees bothering him.

## 2012-08-15 NOTE — Patient Instructions (Signed)
You did wonderfully on your stress test -- the results were outstanding.  Low Risk.  You seem to be doing well from a heart perspective.  Your Blood Pressure and Heart Rate are stable on no medications.  We need to check your Cholesterol levels -- you will get a lab slip for this.  Otherwise, you should be fine for follow-up in 1 yr.  [name], MD

## 2012-08-16 ENCOUNTER — Encounter: Payer: Self-pay | Admitting: Cardiology

## 2012-08-26 DEATH — deceased

## 2012-10-02 DIAGNOSIS — E781 Pure hyperglyceridemia: Secondary | ICD-10-CM | POA: Diagnosis not present

## 2012-10-02 DIAGNOSIS — I428 Other cardiomyopathies: Secondary | ICD-10-CM | POA: Diagnosis not present

## 2012-10-02 LAB — COMPREHENSIVE METABOLIC PANEL
ALT: 22 U/L (ref 0–53)
AST: 16 U/L (ref 0–37)
Albumin: 4.6 g/dL (ref 3.5–5.2)
Alkaline Phosphatase: 60 U/L (ref 39–117)
BUN: 17 mg/dL (ref 6–23)
CO2: 24 mEq/L (ref 19–32)
Calcium: 9.5 mg/dL (ref 8.4–10.5)
Chloride: 105 mEq/L (ref 96–112)
Creat: 1.07 mg/dL (ref 0.50–1.35)
Glucose, Bld: 117 mg/dL — ABNORMAL HIGH (ref 70–99)
Potassium: 4.2 mEq/L (ref 3.5–5.3)
Sodium: 139 mEq/L (ref 135–145)
Total Bilirubin: 0.3 mg/dL (ref 0.3–1.2)
Total Protein: 7.3 g/dL (ref 6.0–8.3)

## 2012-10-02 LAB — LIPID PANEL
Cholesterol: 203 mg/dL — ABNORMAL HIGH (ref 0–200)
HDL: 34 mg/dL — ABNORMAL LOW (ref >39)
LDL Cholesterol: 92 mg/dL (ref 0–99)
Total CHOL/HDL Ratio: 6 Ratio
Triglycerides: 385 mg/dL — ABNORMAL HIGH (ref <150)
VLDL: 77 mg/dL — ABNORMAL HIGH (ref 0–40)

## 2012-10-05 ENCOUNTER — Telehealth: Payer: Self-pay | Admitting: *Deleted

## 2012-10-05 ENCOUNTER — Encounter: Payer: Self-pay | Admitting: *Deleted

## 2012-10-05 NOTE — Telephone Encounter (Signed)
Left message to call back concerning lab work. 

## 2012-10-05 NOTE — Telephone Encounter (Signed)
Spoke to patient. Results given @ labs. Patient request a copy of labs. Will mail him a copy.

## 2012-10-05 NOTE — Telephone Encounter (Addendum)
Message copied by Tobin Chad on Fri Oct 05, 2012  9:13 AM ------      Message from: Herbie Baltimore, DAVID      Created: Wed Oct 03, 2012  1:22 PM       Improved total Cholesterol & LDL, but VLDL is up -- HDL down a bit (may simply be global effect)            Next time we check lipids -- will Check as NMR panel.            [name], MD       ------

## 2012-10-05 NOTE — Telephone Encounter (Signed)
Message copied by Tobin Chad on Fri Oct 05, 2012  8:48 AM ------      Message from: Herbie Baltimore, DAVID      Created: Wed Oct 03, 2012  1:22 PM       Improved total Cholesterol & LDL, but VLDL is up -- HDL down a bit (may simply be global effect)            Next time we check lipids -- will Check as NMR panel.            [name], MD       ------

## 2012-10-22 ENCOUNTER — Telehealth: Payer: Self-pay | Admitting: Cardiology

## 2012-10-22 NOTE — Telephone Encounter (Signed)
Said Dr Herbie Baltimore left him a message to call him.

## 2012-11-02 ENCOUNTER — Encounter: Payer: Self-pay | Admitting: Radiology

## 2012-11-08 DIAGNOSIS — R6889 Other general symptoms and signs: Secondary | ICD-10-CM | POA: Diagnosis not present

## 2012-11-08 DIAGNOSIS — G4700 Insomnia, unspecified: Secondary | ICD-10-CM | POA: Diagnosis not present

## 2012-11-08 DIAGNOSIS — M2550 Pain in unspecified joint: Secondary | ICD-10-CM | POA: Diagnosis not present

## 2012-11-08 DIAGNOSIS — M1710 Unilateral primary osteoarthritis, unspecified knee: Secondary | ICD-10-CM | POA: Diagnosis not present

## 2012-11-08 DIAGNOSIS — R7989 Other specified abnormal findings of blood chemistry: Secondary | ICD-10-CM | POA: Diagnosis not present

## 2012-11-08 DIAGNOSIS — Z79899 Other long term (current) drug therapy: Secondary | ICD-10-CM | POA: Diagnosis not present

## 2012-12-27 ENCOUNTER — Ambulatory Visit: Payer: Medicare Other

## 2012-12-27 ENCOUNTER — Ambulatory Visit (INDEPENDENT_AMBULATORY_CARE_PROVIDER_SITE_OTHER): Payer: Medicare Other | Admitting: Family Medicine

## 2012-12-27 VITALS — BP 132/88 | HR 92 | Temp 98.4°F | Resp 20 | Ht 67.5 in | Wt 200.2 lb

## 2012-12-27 DIAGNOSIS — R509 Fever, unspecified: Secondary | ICD-10-CM

## 2012-12-27 DIAGNOSIS — J209 Acute bronchitis, unspecified: Secondary | ICD-10-CM

## 2012-12-27 DIAGNOSIS — R05 Cough: Secondary | ICD-10-CM

## 2012-12-27 DIAGNOSIS — I428 Other cardiomyopathies: Secondary | ICD-10-CM

## 2012-12-27 DIAGNOSIS — R059 Cough, unspecified: Secondary | ICD-10-CM

## 2012-12-27 LAB — POCT CBC
Granulocyte percent: 65.5 %G (ref 37–80)
HCT, POC: 41.9 % — AB (ref 43.5–53.7)
Hemoglobin: 13.3 g/dL — AB (ref 14.1–18.1)
Lymph, poc: 2.4 (ref 0.6–3.4)
MCH, POC: 30 pg (ref 27–31.2)
MCHC: 31.7 g/dL — AB (ref 31.8–35.4)
MCV: 94.6 fL (ref 80–97)
MID (cbc): 0.8 (ref 0–0.9)
MPV: 11.2 fL (ref 0–99.8)
POC Granulocyte: 6 (ref 2–6.9)
POC LYMPH PERCENT: 26.2 %L (ref 10–50)
POC MID %: 8.3 %M (ref 0–12)
Platelet Count, POC: 144 10*3/uL (ref 142–424)
RBC: 4.43 M/uL — AB (ref 4.69–6.13)
RDW, POC: 14.2 %
WBC: 9.2 10*3/uL (ref 4.6–10.2)

## 2012-12-27 MED ORDER — AMOXICILLIN-POT CLAVULANATE 875-125 MG PO TABS: 1.0000 | ORAL_TABLET | Freq: Two times a day (BID) | ORAL | Status: DC

## 2012-12-27 NOTE — Progress Notes (Signed)
[address]   [place], [place]  [address]   [phone]  Subjective:    Patient ID: [name] [name], male    DOB: 22-Jul-1945, 67 y.o.   MRN: [id number]  Cough Associated symptoms include chills, a fever, headaches and shortness of breath. Pertinent negatives include no chest pain, ear pain, postnasal drip, rash, rhinorrhea or sore throat.  Headache  Associated symptoms include coughing and a fever. Pertinent negatives include no ear pain, nausea, rhinorrhea, sinus pressure, sore throat or vomiting.   This 67 y.o. male presents for evaluation of hoarseness. Onset one week.  Has been out of town.  +fever for two nights; Tmax unknown; +chills/sweats.  +HA. No ear pain or sore throat.  No rhinorrhea; Mild nasal congestion; +coughing intermittent; +sputum scant.  +SOB; +fatigued; no v/d.  Decreased appetite.  Taking vitamins.  Has also taken cold capsules.     Review of Systems  Constitutional: Positive for fever, chills, diaphoresis and fatigue.  HENT: Positive for congestion. Negative for ear pain, sore throat, rhinorrhea, postnasal drip and sinus pressure.   Respiratory: Positive for cough and shortness of breath.   Cardiovascular: Negative for chest pain, palpitations and leg swelling.  Gastrointestinal: Negative for nausea, vomiting and diarrhea.  Skin: Negative for rash.  Neurological: Positive for headaches.   Past Medical History  Diagnosis Date  . Arthritis   . Nonischemic cardiomyopathy  09/04/2009    2D echo-EF 40-45%, Global Hypokinesis  . Chest pain with low risk for cardiac etiology     stress test 10/05/98; cath 2004; cardiac CT 03/2008-no significant disease, nl EF  . Hyperlipemia   . Exertional shortness of breath 06/2012    CPET test -   . Abnormal stress test 10/05/98    mild anteroseptal ischemia at the apex, EF 42%  . Borderline hypertension    Past Surgical History  Procedure Laterality Date  . Appendectomy    . Lumbar spine  08/2000  . Back surgery    .  Cardiac catheterization  05/23/2002    minimal irregularities in the LAD, EF 50%  . Melanoma excision  09/2009    Dermatology Tollie Eth PA with Dr Terri Piedra   Allergies  Allergen Reactions  . Nitroglycerin Anaphylaxis    Cardiologist suggested he shouldn't take this med because it could kill him with his heart condition. Lowers BP  . Ace Inhibitors Swelling  . Lovastatin Nausea Only   Current Outpatient Prescriptions on File Prior to Visit  Medication Sig Dispense Refill  . Cyanocobalamin (B-12 SL) Place 1 tablet under the tongue daily.      . fish oil-omega-3 fatty acids 1000 MG capsule Take 1 g by mouth daily.      Marland Kitchen gabapentin (NEURONTIN) 300 MG capsule Take 300 mg by mouth as needed.      . Garlic TABS Take 1 tablet by mouth daily.      . Misc Natural Products (SAW PALMETTO) CAPS Take 1 capsule by mouth daily.      . Multiple Vitamins-Minerals (MULTIVITAMIN PO) Take 1 tablet by mouth daily.      . Pomegranate, Punica granatum, (POMEGRANATE PO) Take 1 capsule by mouth daily.      . Red Yeast Rice Extract (RED YEAST RICE PO) Take 1 capsule by mouth daily.      . sildenafil (VIAGRA) 100 MG tablet 1/2 to 1 tablet up to each day as needed.  10 tablet  5   No current facility-administered medications on file prior to  visit.   History   Social History  . Marital Status: Widowed    Spouse Name: N/A    Number of Children: N/A  . Years of Education: N/A   Occupational History  . 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   Social History Main Topics  . Smoking status: Former Smoker -- 2.00 packs/day for 20 years    Types: Cigarettes    Quit date: 03/28/1980  . Smokeless tobacco: Never Used  . Alcohol Use: No  . Drug Use: No  . Sexual Activity: Yes     Comment: significate other   Other Topics Concern  . Not on file   Social History Narrative   He walks on a relatively regular basis about 15 minutes at a time mostly to the discomfort. He  previously had walked up a 30-40 minutes a time without problems.       Objective:   Physical Exam  Nursing note and vitals reviewed. Constitutional: He is oriented to person, place, and time. He appears well-developed and well-nourished. No distress.  HENT:  Head: Normocephalic and atraumatic.  Right Ear: External ear normal.  Left Ear: External ear normal.  Nose: Nose normal.  Mouth/Throat: Oropharynx is clear and moist.  Eyes: Conjunctivae and EOM are normal. Pupils are equal, round, and reactive to light.  Neck: Normal range of motion. Neck supple.  Cardiovascular: Normal rate, regular rhythm and normal heart sounds.   No murmur heard. Trace LE pitting edema.  Pulmonary/Chest: Effort normal and breath sounds normal. He has no wheezes. He has no rales.  Lymphadenopathy:    He has no cervical adenopathy.  Neurological: He is alert and oriented to person, place, and time.  Skin: Skin is warm and dry. He is not diaphoretic.  Psychiatric: He has a normal mood and affect. His behavior is normal.   Results for orders placed in visit on 12/27/12  POCT CBC      Result Value Range   WBC 9.2  4.6 - 10.2 K/uL   Lymph, poc 2.4  0.6 - 3.4   POC LYMPH PERCENT 26.2  10 - 50 %L   MID (cbc) 0.8  0 - 0.9   POC MID % 8.3  0 - 12 %M   POC Granulocyte 6.0  2 - 6.9   Granulocyte percent 65.5  37 - 80 %G   RBC 4.43 (*) 4.69 - 6.13 M/uL   Hemoglobin 13.3 (*) 14.1 - 18.1 g/dL   HCT, POC 40.9 (*) 81.1 - 53.7 %   MCV 94.6  80 - 97 fL   MCH, POC 30.0  27 - 31.2 pg   MCHC 31.7 (*) 31.8 - 35.4 g/dL   RDW, POC 91.4     Platelet Count, POC 144  142 - 424 K/uL   MPV 11.2  0 - 99.8 fL   [facility] reading (PRIMARY) by  Dr. Katrinka Blazing. CXR: ?LLL infiltrate mild.      Assessment & Plan:  Cough - Plan: POCT CBC, DG Chest 2 View  Fever, unspecified - Plan: POCT CBC, DG Chest 2 View  Nonischemic cardiomyopathy   1,  Cough:  New; ddx includes URI versus bronchitis.  Due to history of CM, treat with  Augmentin; continue OTC cough medication; RTC for acute worsening. 2. Fever:  New.  Associated with cough.  Recommend Tylenol and Motrin.    Meds ordered this encounter  Medications  . amoxicillin-clavulanate (AUGMENTIN) 875-125 MG per tablet  Sig: Take 1 tablet by mouth 2 (two) times daily.    Dispense:  20 tablet    Refill:  0

## 2013-01-15 DIAGNOSIS — D485 Neoplasm of uncertain behavior of skin: Secondary | ICD-10-CM | POA: Diagnosis not present

## 2013-01-15 DIAGNOSIS — D235 Other benign neoplasm of skin of trunk: Secondary | ICD-10-CM | POA: Diagnosis not present

## 2013-01-15 DIAGNOSIS — Z85820 Personal history of malignant melanoma of skin: Secondary | ICD-10-CM | POA: Diagnosis not present

## 2013-01-22 DIAGNOSIS — N401 Enlarged prostate with lower urinary tract symptoms: Secondary | ICD-10-CM | POA: Diagnosis not present

## 2013-01-22 DIAGNOSIS — R972 Elevated prostate specific antigen [PSA]: Secondary | ICD-10-CM | POA: Diagnosis not present

## 2013-01-22 DIAGNOSIS — N529 Male erectile dysfunction, unspecified: Secondary | ICD-10-CM | POA: Diagnosis not present

## 2013-02-04 ENCOUNTER — Ambulatory Visit (INDEPENDENT_AMBULATORY_CARE_PROVIDER_SITE_OTHER): Payer: Medicare Other | Admitting: Emergency Medicine

## 2013-02-04 VITALS — BP 120/82 | HR 97 | Temp 97.6°F | Resp 18 | Ht 68.0 in | Wt 203.0 lb

## 2013-02-04 DIAGNOSIS — Z23 Encounter for immunization: Secondary | ICD-10-CM

## 2013-02-04 DIAGNOSIS — S43499A Other sprain of unspecified shoulder joint, initial encounter: Secondary | ICD-10-CM

## 2013-02-04 DIAGNOSIS — S46819A Strain of other muscles, fascia and tendons at shoulder and upper arm level, unspecified arm, initial encounter: Secondary | ICD-10-CM

## 2013-02-04 MED ORDER — NAPROXEN SODIUM 550 MG PO TABS: 550.0000 mg | ORAL_TABLET | Freq: Two times a day (BID) | ORAL | Status: DC

## 2013-02-04 MED ORDER — CYCLOBENZAPRINE HCL 10 MG PO TABS: 10.0000 mg | ORAL_TABLET | Freq: Three times a day (TID) | ORAL | Status: DC | PRN

## 2013-02-04 NOTE — Progress Notes (Signed)
[facility] [address] [place] [address] [phone]  Date:  02/04/2013   Name:  [name] [name]   DOB:  12-14-1945   MRN:  [id number]  PCP:  Shade Flood, MD    Chief Complaint: Flu Vaccine and Eye Pain   History of Present Illness:  [name] [name] is a 67 y.o. very pleasant male patient who presents with the following:  Says that he was working with wood and last night got something in his right eye and had significant pain in his eye that was removed by his girlfriend.  Now asymptomatic.   Requesting flu shot. Has pain in shoulders says it has been a problem for about two weeks and has felt like a knife stuck in his shoulders.  Has to get out of bed at night to take tylenol due to pain.  No history of injury or overuse.  Works casually in [employer].  No improvement with over the counter medications or other home remedies. Denies other complaint or health concern today.   Patient Active Problem List   Diagnosis Date Noted  . Exertional shortness of breath 06/26/2012  . Polyp of colon, adenomatous 04/27/2012  . Elevated PSA 10/20/2011  . Hypertriglyceridemia 07/08/2011  . Nonischemic cardiomyopathy 09/04/2009    Past Medical History  Diagnosis Date  . Arthritis   . Nonischemic cardiomyopathy  09/04/2009    2D echo-EF 40-45%, Global Hypokinesis  . Chest pain with low risk for cardiac etiology     stress test 10/05/98; cath 2004; cardiac CT 03/2008-no significant disease, nl EF  . Hyperlipemia   . Exertional shortness of breath 06/2012    CPET test -   . Abnormal stress test 10/05/98    mild anteroseptal ischemia at the apex, EF 42%  . Borderline hypertension     Past Surgical History  Procedure Laterality Date  . Appendectomy    . Lumbar spine  08/2000  . Back surgery    . Cardiac catheterization  05/23/2002    minimal irregularities in the LAD, EF 50%  . Melanoma excision  09/2009    Dermatology Tollie Eth PA with Dr Terri Piedra     History  Substance Use Topics  . Smoking status: Former Smoker -- 2.00 packs/day for 20 years    Types: Cigarettes    Quit date: 03/28/1980  . Smokeless tobacco: Never Used  . Alcohol Use: No    Family History  Problem Relation Age of Onset  . Cardiomyopathy      idiopathic. trivial disease 2004 cath, 2010 cardiac CT - no sig. dz, nl LV fxn. cards: Little  . Arthritis Mother   . COPD Father   . Cancer Father     lung cancer  . Cancer Sister   . Colon cancer Neg Hx     Allergies  Allergen Reactions  . Nitroglycerin Anaphylaxis    Cardiologist suggested he shouldn't take this med because it could kill him with his heart condition. Lowers BP  . Ace Inhibitors Swelling  . Lovastatin Nausea Only    Medication list has been reviewed and updated.  Current Outpatient Prescriptions on File Prior to Visit  Medication Sig Dispense Refill  . fish oil-omega-3 fatty acids 1000 MG capsule Take 1 g by mouth daily.      Marland Kitchen gabapentin (NEURONTIN) 300 MG capsule Take 300 mg by mouth as needed.      . Garlic TABS Take 1 tablet by mouth daily.      Marland Kitchen  Misc Natural Products (SAW PALMETTO) CAPS Take 1 capsule by mouth daily.      . Multiple Vitamins-Minerals (MULTIVITAMIN PO) Take 1 tablet by mouth daily.      . Pomegranate, Punica granatum, (POMEGRANATE PO) Take 1 capsule by mouth daily.      . Red Yeast Rice Extract (RED YEAST RICE PO) Take 1 capsule by mouth daily.      . sildenafil (VIAGRA) 100 MG tablet 1/2 to 1 tablet up to each day as needed.  10 tablet  5  . Cyanocobalamin (B-12 SL) Place 1 tablet under the tongue daily.       No current facility-administered medications on file prior to visit.    Review of Systems:  As per HPI, otherwise negative.    Physical Examination: Filed Vitals:   02/04/13 1700  BP: 120/82  Pulse: 97  Temp: 97.6 F (36.4 C)  Resp: 18   Filed Vitals:   02/04/13 1700  Height: 5\' 8"  (1.727 m)  Weight: 203 lb (92.08 kg)   Body mass index is  30.87 kg/(m^2). Ideal Body Weight: Weight in (lb) to have BMI = 25: 164.1   GEN: WDWN, NAD, Non-toxic, Alert & Oriented x 3 HEENT: Atraumatic, Normocephalic.   PRRERLA EOMI no FB.  Conjunctiva and sclera clear.   Ears and Nose: No external deformity. EXTR: No clubbing/cyanosis/edema NEURO: Normal gait.  PSYCH: Normally interactive. Conversant. Not depressed or anxious appearing.  Calm demeanor.  SHOULDERS:  Tender over trapezius bilaterally Full ROM. Assessment and Plan: Trapezius strain Flu shot   Signed,  Phillips Odor, MD

## 2013-02-04 NOTE — Patient Instructions (Signed)
Shoulder Sprain  A shoulder sprain is the result of damage to the tough, fiber-like tissues (ligaments) that help hold your shoulder in place. The ligaments may be stretched or torn. Besides the main shoulder joint (the ball and socket), there are several smaller joints that connect the bones in this area. A sprain usually involves one of those joints. Most often it is the acromioclavicular (or AC) joint. That is the joint that connects the collarbone (clavicle) and the shoulder blade (scapula) at the top point of the shoulder blade (acromion).  A shoulder sprain is a mild form of what is called a shoulder separation. Recovering from a shoulder sprain may take some time. For some, pain lingers for several months. Most people recover without long term problems.  CAUSES    A shoulder sprain is usually caused by some kind of trauma. This might be:   Falling on an outstretched arm.   Being hit hard on the shoulder.   Twisting the arm.   Shoulder sprains are more likely to occur in people who:   Play sports.   Have balance or coordination problems.  SYMPTOMS    Pain when you move your shoulder.   Limited ability to move the shoulder.   Swelling and tenderness on top of the shoulder.   Redness or warmth in the shoulder.   Bruising.   A change in the shape of the shoulder.  DIAGNOSIS   Your healthcare provider may:   Ask about your symptoms.   Ask about recent activity that might have caused those symptoms.   Examine your shoulder. You may be asked to do simple exercises to test movement. The other shoulder will be examined for comparison.   Order some tests that provide a look inside the body. They can show the extent of the injury. The tests could include:   X-rays.   CT (computed tomography) scan.   MRI (magnetic resonance imaging) scan.  RISKS AND COMPLICATIONS   Loss of full shoulder motion.   Ongoing shoulder pain.  TREATMENT   How long it takes to recover from a shoulder sprain depends on how  severe it was. Treatment options may include:   Rest. You should not use the arm or shoulder until it heals.   Ice. For 2 or 3 days after the injury, put an ice pack on the shoulder up to 4 times a day. It should stay on for 15 to 20 minutes each time. Wrap the ice in a towel so it does not touch your skin.   Over-the-counter medicine to relieve pain.   A sling or brace. This will keep the arm still while the shoulder is healing.   Physical therapy or rehabilitation exercises. These will help you regain strength and motion. Ask your healthcare provider when it is OK to begin these exercises.   Surgery. The need for surgery is rare with a sprained shoulder, but some people may need surgery to keep the joint in place and reduce pain.  HOME CARE INSTRUCTIONS    Ask your healthcare provider about what you should and should not do while your shoulder heals.   Make sure you know how to apply ice to the correct area of your shoulder.   Talk with your healthcare provider about which medications should be used for pain and swelling.   If rehabilitation therapy will be needed, ask your healthcare provider to refer you to a therapist. If it is not recommended, then ask about at-home exercises. Find   out when exercise should begin.  SEEK MEDICAL CARE IF:   Your pain, swelling, or redness at the joint increases.  SEEK IMMEDIATE MEDICAL CARE IF:    You have a fever.   You cannot move your arm or shoulder.  Document Released: 07/31/2008 Document Revised: 06/06/2011 Document Reviewed: 07/31/2008  [employer] Patient Information 2014 [employer].

## 2013-02-18 DIAGNOSIS — M19049 Primary osteoarthritis, unspecified hand: Secondary | ICD-10-CM | POA: Diagnosis not present

## 2013-02-18 DIAGNOSIS — R6889 Other general symptoms and signs: Secondary | ICD-10-CM | POA: Diagnosis not present

## 2013-02-18 DIAGNOSIS — M1710 Unilateral primary osteoarthritis, unspecified knee: Secondary | ICD-10-CM | POA: Diagnosis not present

## 2013-02-18 DIAGNOSIS — R7989 Other specified abnormal findings of blood chemistry: Secondary | ICD-10-CM | POA: Diagnosis not present

## 2013-04-16 DIAGNOSIS — N529 Male erectile dysfunction, unspecified: Secondary | ICD-10-CM | POA: Diagnosis not present

## 2013-04-16 DIAGNOSIS — N401 Enlarged prostate with lower urinary tract symptoms: Secondary | ICD-10-CM | POA: Diagnosis not present

## 2013-04-16 DIAGNOSIS — N139 Obstructive and reflux uropathy, unspecified: Secondary | ICD-10-CM | POA: Diagnosis not present

## 2013-04-16 DIAGNOSIS — R972 Elevated prostate specific antigen [PSA]: Secondary | ICD-10-CM | POA: Diagnosis not present

## 2013-04-17 DIAGNOSIS — M25579 Pain in unspecified ankle and joints of unspecified foot: Secondary | ICD-10-CM | POA: Diagnosis not present

## 2013-04-17 DIAGNOSIS — Z79899 Other long term (current) drug therapy: Secondary | ICD-10-CM | POA: Diagnosis not present

## 2013-04-17 DIAGNOSIS — M1710 Unilateral primary osteoarthritis, unspecified knee: Secondary | ICD-10-CM | POA: Diagnosis not present

## 2013-04-17 DIAGNOSIS — M25569 Pain in unspecified knee: Secondary | ICD-10-CM | POA: Diagnosis not present

## 2013-04-17 DIAGNOSIS — R7989 Other specified abnormal findings of blood chemistry: Secondary | ICD-10-CM | POA: Diagnosis not present

## 2013-05-03 ENCOUNTER — Other Ambulatory Visit: Payer: Self-pay | Admitting: Urology

## 2013-05-17 ENCOUNTER — Encounter (HOSPITAL_BASED_OUTPATIENT_CLINIC_OR_DEPARTMENT_OTHER): Payer: Self-pay | Admitting: *Deleted

## 2013-05-20 ENCOUNTER — Encounter (HOSPITAL_BASED_OUTPATIENT_CLINIC_OR_DEPARTMENT_OTHER): Payer: Self-pay | Admitting: *Deleted

## 2013-05-20 NOTE — Progress Notes (Signed)
NPO AFTER MN. ARRIVE AT 0700. NEEDS HG AND EKG. WILL DO FLEET ENEMA AM DOS.

## 2013-05-21 NOTE — H&P (Signed)
Reason For Visit [name] [name] presents today for repeat transrectal ultrasound of the prostate with prostate biopsy under IV sedation  History of Present Illness  [name] [name] presents today primarily for further follow-up as elevated PSA. When he was here in October his PSA jumped up to 9.9. I suggested that we check it again in 6 months rather than waiting year but he wanted to come in even sooner. Overall voiding is relatively unchanged. AUA symptom score today is 3. He is interested in going ahead with purchase of some generic Viagra for mild erectile dysfunction. He has no new concerns or complaints today. Again he has been followed for elevated PSA for well over 10 years.       Past Gu Hx:       [name] [name] is 68 years of age. Over the years, the biggest issue has been an elevated PSA. Over the last several years, his PSA has been anywhere from 4.3 to 9.8 with a variable PSA-2 reading in the 17 to 31 percent range. The patient had a negative biopsy in 2002. A repeat ultrasound and biopsy in January of 2007 was also negative. Because his PSA jumped up a little further, we did a PCA-3 test on him (2008) which revealed a very low score, and therefore, a very low risk of prostate cancer. The patient has some baseline, moderate obstructive symptoms, but nothing particularly problematic. PSA in 2013 was actually down to 4.96. In April 2014 PSA had increased again to 7.2. Rectal exam was unchanged and we felt continued observation was indicated.   Past Medical History Problems  1. History of Arthritis (V13.4) 2. History of Coronary Artery Disease (V12.59)  Surgical History Problems  1. History of Colonoscopy (Fiberoptic)  Current Meds 1. Fish Oil CAPS; 1 per day;  Therapy: (Recorded:23Feb2012) to Recorded 2. Gabapentin 300 MG Oral Capsule; 1 as needed;  Therapy: 18Sep2011 to Recorded 3. Garlic CAPS; 1 per day;  Therapy: (Recorded:23Feb2012) to Recorded 4. Multi-Vitamin TABS; TAKE 1 TABLET  DAILY;  Therapy: (Recorded:23Feb2012) to Recorded 5. Red Yeast Rice CAPS; 1 per day;  Therapy: (Recorded:23Feb2012) to Recorded 6. Saw Palmetto CAPS;  Therapy: (Recorded:22Oct2012) to Recorded 7. Viagra 100 MG Oral Tablet; TAKE AS DIRECTED;  Therapy: [id number] to (Last Rx:16Mar2012) Ordered 8. Vitamin B12 TABS; 500 mg; 1 tablet; 1 time daily;  Therapy: (Recorded:22Oct2012) to Recorded  Allergies Medication  1. No Known Drug Allergies  Family History Problems  1. Family history of Family Health Status Number Of Children   2 sons 2. No pertinent family history : Mother  Social History Problems  1. Denied: Alcohol Use 2. Caffeine Use   1 1/2 cups 3. Marital History - Currently Married 4. Never smoker (V49.89) 5. Occupation:   Chief Strategy Officer 6. History of Tobacco Use (V15.82)   2 packs, 15 years  Review of Systems Genitourinary, constitutional, skin, eye, otolaryngeal, hematologic/lymphatic, cardiovascular, pulmonary, endocrine, musculoskeletal, gastrointestinal, neurological and psychiatric system(s) were reviewed and pertinent findings if present are noted.  Genitourinary: nocturia, incomplete emptying of bladder and erectile dysfunction, but no dysuria, urine stream is not weak, urinary stream does not start and stop and no hematuria.  Eyes: blurred vision.  Respiratory: cough.  Musculoskeletal: back pain.  Neurological: headache.    Vitals Vital Signs [Data Includes: Last 1 Day]  Recorded: 20Jan2015 01:15PM  Blood Pressure: 131 / 77 Temperature: 98.2 F Heart Rate: 108  Physical Exam Well-developed well-nourished male in no acute distress Respiratory: Normal effort Cardiac: Regular rate and rhythm Abdomen:  Soft nontender no palpable masses  Rectal: Rectal exam demonstrates normal sphincter tone, no tenderness and no masses. Estimated prostate size is 1+. The prostate has no nodularity and is not tender. The left seminal vesicle is nonpalpable. The right seminal  vesicle is nonpalpable. The perineum is normal on inspection.    Results/Data Urine [Data Includes: Last 1 Day]   20Jan2015 COLOR YELLOW  APPEARANCE CLEAR  SPECIFIC GRAVITY 1.025  pH 5.5  GLUCOSE NEG mg/dL BILIRUBIN NEG  KETONE NEG mg/dL BLOOD TRACE  PROTEIN NEG mg/dL UROBILINOGEN 0.2 mg/dL NITRITE NEG  LEUKOCYTE ESTERASE NEG  SQUAMOUS EPITHELIAL/HPF RARE  WBC 0-2 WBC/hpf RBC 0-2 RBC/hpf BACTERIA RARE  CRYSTALS Calcium Oxalate crystals noted  CASTS NONE SEEN   Assessment Assessed  1. Erectile dysfunction due to arterial insufficiency (607.84) 2. Benign prostatic hyperplasia with urinary obstruction (600.01,599.69) 3. Elevated prostate specific antigen (PSA) (790.93)  Plan Health Maintenance  1. UA With REFLEX; [Do Not Release]; Status:Complete;   Done: [id number] 12:55PM  Discussion/Summary We discussed his situation today. His PSA shown quite a bit of lability over the last 10 years. If his PSA now is up further then I will recommend another ultrasound and biopsy. We may want to consider a transrectal rectal MRI prior to the biopsy to better map a strategy. If his PSA does improve we will plan on keeping his routine follow-up for later this fall. We will go ahead with a purchase of generic Viagra for him. I will notify him the next few days what his repeat PSA values are.   Signatures Electronically signed by : Rana Snare, M.D.; Apr 16 2013  1:32PM EST

## 2013-05-22 ENCOUNTER — Ambulatory Visit (HOSPITAL_BASED_OUTPATIENT_CLINIC_OR_DEPARTMENT_OTHER): Payer: Medicare Other | Admitting: Anesthesiology

## 2013-05-22 ENCOUNTER — Encounter (HOSPITAL_BASED_OUTPATIENT_CLINIC_OR_DEPARTMENT_OTHER): Payer: Medicare Other | Admitting: Anesthesiology

## 2013-05-22 ENCOUNTER — Encounter (HOSPITAL_BASED_OUTPATIENT_CLINIC_OR_DEPARTMENT_OTHER)
Admission: RE | Disposition: A | Discharge status: Home / Self Care | Payer: Self-pay | Source: Ambulatory Visit | Attending: Urology

## 2013-05-22 ENCOUNTER — Other Ambulatory Visit: Payer: Self-pay

## 2013-05-22 ENCOUNTER — Encounter (HOSPITAL_BASED_OUTPATIENT_CLINIC_OR_DEPARTMENT_OTHER): Payer: Self-pay | Admitting: *Deleted

## 2013-05-22 ENCOUNTER — Ambulatory Visit (HOSPITAL_BASED_OUTPATIENT_CLINIC_OR_DEPARTMENT_OTHER)
Admission: RE | Admit: 2013-05-22 | Discharge: 2013-05-22 | Disposition: A | Discharge status: Home / Self Care | Payer: Medicare Other | Source: Ambulatory Visit | Attending: Urology | Admitting: Urology

## 2013-05-22 DIAGNOSIS — N529 Male erectile dysfunction, unspecified: Secondary | ICD-10-CM | POA: Diagnosis not present

## 2013-05-22 DIAGNOSIS — R972 Elevated prostate specific antigen [PSA]: Secondary | ICD-10-CM | POA: Insufficient documentation

## 2013-05-22 DIAGNOSIS — M129 Arthropathy, unspecified: Secondary | ICD-10-CM | POA: Insufficient documentation

## 2013-05-22 DIAGNOSIS — I2510 Atherosclerotic heart disease of native coronary artery without angina pectoris: Secondary | ICD-10-CM | POA: Insufficient documentation

## 2013-05-22 DIAGNOSIS — N419 Inflammatory disease of prostate, unspecified: Secondary | ICD-10-CM | POA: Insufficient documentation

## 2013-05-22 DIAGNOSIS — Z79899 Other long term (current) drug therapy: Secondary | ICD-10-CM | POA: Insufficient documentation

## 2013-05-22 DIAGNOSIS — N401 Enlarged prostate with lower urinary tract symptoms: Secondary | ICD-10-CM | POA: Insufficient documentation

## 2013-05-22 DIAGNOSIS — N139 Obstructive and reflux uropathy, unspecified: Secondary | ICD-10-CM | POA: Diagnosis not present

## 2013-05-22 DIAGNOSIS — I739 Peripheral vascular disease, unspecified: Secondary | ICD-10-CM | POA: Insufficient documentation

## 2013-05-22 DIAGNOSIS — N138 Other obstructive and reflux uropathy: Secondary | ICD-10-CM | POA: Insufficient documentation

## 2013-05-22 HISTORY — DX: Pure hyperglyceridemia: E78.1

## 2013-05-22 HISTORY — DX: Personal history of malignant melanoma of skin: Z85.820

## 2013-05-22 HISTORY — DX: Benign prostatic hyperplasia without lower urinary tract symptoms: N40.0

## 2013-05-22 HISTORY — DX: Atherosclerotic heart disease of native coronary artery without angina pectoris: I25.10

## 2013-05-22 HISTORY — DX: Other specified postprocedural states: Z98.890

## 2013-05-22 HISTORY — DX: Personal history of other diseases of the musculoskeletal system and connective tissue: Z87.39

## 2013-05-22 HISTORY — DX: Elevated prostate specific antigen (PSA): R97.20

## 2013-05-22 HISTORY — PX: PROSTATE BIOPSY: SHX241

## 2013-05-22 LAB — POCT HEMOGLOBIN-HEMACUE: Hemoglobin: 13.9 g/dL (ref 13.0–17.0)

## 2013-05-22 SURGERY — BIOPSY, PROSTATE, RECTAL APPROACH, WITH US GUIDANCE
Anesthesia: Monitor Anesthesia Care | Site: Prostate

## 2013-05-22 MED ORDER — FENTANYL CITRATE 0.05 MG/ML IJ SOLN
INTRAMUSCULAR | Status: AC
  Filled 2013-05-22: qty 4

## 2013-05-22 MED ORDER — KETOROLAC TROMETHAMINE 30 MG/ML IJ SOLN
INTRAMUSCULAR | Status: DC | PRN
Start: 2013-05-22 — End: 2013-05-22
  Administered 2013-05-22: 30 mg via INTRAVENOUS

## 2013-05-22 MED ORDER — LACTATED RINGERS IV SOLN
INTRAVENOUS | Status: DC
  Filled 2013-05-22: qty 1000

## 2013-05-22 MED ORDER — MIDAZOLAM HCL 5 MG/5ML IJ SOLN
INTRAMUSCULAR | Status: DC | PRN
Start: 2013-05-22 — End: 2013-05-22
  Administered 2013-05-22 (×3): 1 mg via INTRAVENOUS

## 2013-05-22 MED ORDER — DEXTROSE 5 % IV SOLN
2.0000 g | Freq: Once | INTRAVENOUS | Status: AC
  Administered 2013-05-22: 2 g via INTRAVENOUS
  Filled 2013-05-22: qty 2

## 2013-05-22 MED ORDER — LIDOCAINE HCL 2 % EX GEL
CUTANEOUS | Status: DC | PRN
  Administered 2013-05-22: 1

## 2013-05-22 MED ORDER — ONDANSETRON HCL 4 MG/2ML IJ SOLN
INTRAMUSCULAR | Status: DC | PRN
  Administered 2013-05-22: 4 mg via INTRAVENOUS

## 2013-05-22 MED ORDER — PROMETHAZINE HCL 25 MG/ML IJ SOLN
6.2500 mg | INTRAMUSCULAR | Status: DC | PRN
  Filled 2013-05-22: qty 1

## 2013-05-22 MED ORDER — FENTANYL CITRATE 0.05 MG/ML IJ SOLN
INTRAMUSCULAR | Status: DC | PRN
  Administered 2013-05-22 (×4): 12.5 ug via INTRAVENOUS

## 2013-05-22 MED ORDER — LACTATED RINGERS IV SOLN
INTRAVENOUS | Status: DC | PRN
  Administered 2013-05-22: 10:00:00
  Administered 2013-05-22: 08:00:00 via INTRAVENOUS

## 2013-05-22 MED ORDER — FENTANYL CITRATE 0.05 MG/ML IJ SOLN
INTRAMUSCULAR | Status: AC
  Filled 2013-05-22: qty 2

## 2013-05-22 MED ORDER — PROPOFOL 10 MG/ML IV EMUL
INTRAVENOUS | Status: DC | PRN
  Administered 2013-05-22: 50 ug/kg/min via INTRAVENOUS

## 2013-05-22 MED ORDER — MIDAZOLAM HCL 2 MG/2ML IJ SOLN
INTRAMUSCULAR | Status: AC
  Filled 2013-05-22: qty 2

## 2013-05-22 MED ORDER — LIDOCAINE HCL 2 % IJ SOLN
INTRAMUSCULAR | Status: DC | PRN
  Administered 2013-05-22: 5 mL

## 2013-05-22 MED ORDER — FENTANYL CITRATE 0.05 MG/ML IJ SOLN
25.0000 ug | INTRAMUSCULAR | Status: DC | PRN
  Administered 2013-05-22: 25 ug via INTRAVENOUS
  Filled 2013-05-22: qty 1

## 2013-05-22 SURGICAL SUPPLY — 8 items
DRESSING TELFA 8X3 (GAUZE/BANDAGES/DRESSINGS) IMPLANT
NDL SAFETY ECLIPSE 18X1.5 (NEEDLE) IMPLANT
NEEDLE HYPO 18GX1.5 SHARP (NEEDLE)
NEEDLE HYPO 22GX1.5 SAFETY (NEEDLE) ×2 IMPLANT
NEEDLE SPNL 22GX7 QUINCKE BK (NEEDLE) ×2 IMPLANT
SYR CONTROL 10ML LL (SYRINGE) ×2 IMPLANT
TOWEL OR 17X24 6PK STRL BLUE (TOWEL DISPOSABLE) ×2 IMPLANT
UNDERPAD 30X30 INCONTINENT (UNDERPADS AND DIAPERS) ×2 IMPLANT

## 2013-05-22 NOTE — Interval H&P Note (Signed)
History and Physical Interval Note:  05/22/2013 8:36 AM  [name] [name]  has presented today for surgery, with the diagnosis of Elevated Prostatic Specific Antigen  The various methods of treatment have been discussed with the patient and family. After consideration of risks, benefits and other options for treatment, the patient has consented to  Procedure(s): BIOPSY TRANSRECTAL ULTRASONIC PROSTATE (TUBP) (N/A) as a surgical intervention .  The patient's history has been reviewed, patient examined, no change in status, stable for surgery.  I have reviewed the patient's chart and labs.  Questions were answered to the patient's satisfaction.     Daesia Zylka S

## 2013-05-22 NOTE — Transfer of Care (Signed)
Immediate Anesthesia Transfer of Care Note  Patient: [name] [name]  Procedure(s) Performed: Procedure(s) (LRB): BIOPSY TRANSRECTAL ULTRASONIC PROSTATE (TUBP) (N/A)  Patient Location: PACU  Anesthesia Type: MAC  Level of Consciousness: awake, sedated, patient cooperative and responds to stimulation  Airway & Oxygen Therapy: Patient Spontanous Breathing and Patient connected to face mask oxygen with transport  Post-op Assessment: Report given to PACU RN, Post -op Vital signs reviewed and stable and Patient moving all extremities  Post vital signs: Reviewed and stable  Complications: No apparent anesthesia complications

## 2013-05-22 NOTE — Anesthesia Preprocedure Evaluation (Signed)
Anesthesia Evaluation  Patient identified by MRN, date of birth, ID band Patient awake  General Assessment Comment:Nonischemic cardiomyopathy   09-04-2009  --  2D echo-EF 40-45%, Global Hypokinesis   Reviewed: Allergy & Precautions, H&P , NPO status , Patient's Chart, lab work & pertinent test results  Airway Mallampati: II TM Distance: >3 FB Neck ROM: Full    Dental no notable dental hx.    Pulmonary neg pulmonary ROS, former smoker,  breath sounds clear to auscultation  Pulmonary exam normal       Cardiovascular +CHF Rhythm:Regular Rate:Normal     Neuro/Psych negative neurological ROS  negative psych ROS   GI/Hepatic negative GI ROS, Neg liver ROS,   Endo/Other  negative endocrine ROS  Renal/GU negative Renal ROS  negative genitourinary   Musculoskeletal negative musculoskeletal ROS (+)   Abdominal   Peds negative pediatric ROS (+)  Hematology negative hematology ROS (+)   Anesthesia Other Findings   Reproductive/Obstetrics negative OB ROS                           Anesthesia Physical Anesthesia Plan  ASA: III  Anesthesia Plan: MAC   Post-op Pain Management:    Induction: Intravenous  Airway Management Planned: Simple Face Mask  Additional Equipment:   Intra-op Plan:   Post-operative Plan:   Informed Consent: I have reviewed the patients History and Physical, chart, labs and discussed the procedure including the risks, benefits and alternatives for the proposed anesthesia with the patient or authorized representative who has indicated his/her understanding and acceptance.     Plan Discussed with: CRNA and Surgeon  Anesthesia Plan Comments:         Anesthesia Quick Evaluation

## 2013-05-22 NOTE — Anesthesia Postprocedure Evaluation (Signed)
  Anesthesia Post-op Note  Patient: [name] [name]  Procedure(s) Performed: Procedure(s) (LRB): BIOPSY TRANSRECTAL ULTRASONIC PROSTATE (TUBP) (N/A)  Patient Location: PACU  Anesthesia Type: MAC  Level of Consciousness: awake and alert   Airway and Oxygen Therapy: Patient Spontanous Breathing  Post-op Pain: mild  Post-op Assessment: Post-op Vital signs reviewed, Patient's Cardiovascular Status Stable, Respiratory Function Stable, Patent Airway and No signs of Nausea or vomiting  Last Vitals:  Filed Vitals:   05/22/13 0902  BP: 110/61  Pulse: 82  Temp: 36.4 C  Resp: 10    Post-op Vital Signs: stable   Complications: No apparent anesthesia complications

## 2013-05-22 NOTE — Anesthesia Procedure Notes (Signed)
Procedure Name: MAC Date/Time: 05/22/2013 8:38 AM Performed by: Justice Rocher Pre-anesthesia Checklist: Patient identified, Emergency Drugs available, Suction available, Patient being monitored and Timeout performed Patient Re-evaluated:Patient Re-evaluated prior to inductionOxygen Delivery Method: Simple face mask Preoxygenation: Pre-oxygenation with 100% oxygen Intubation Type: IV induction Placement Confirmation: positive ETCO2

## 2013-05-22 NOTE — Op Note (Signed)
Preoperative diagnosis: Elevated PSA Postoperative diagnosis: Same  Procedure: Transrectal ultrasound of the prostate with ultrasound-guided biopsy   Surgeon: [name] M.D.  Anesthesia: MAC  Indications: [name] [name] is 68 years of age has had a long-standing history of elevated PSA. He is status post negative biopsies in 2002 2007. The patient has had an increase in his PSA recently to 10.0. Rectal exam is unchanged. We felt that given the further increase in his PSA that another biopsy was indicated. Full informed consent obtained. The patient is received parenteral antibiotics.     Technique and findings: Patient was brought the operating room where he received IV monitored anesthesia. He was placed in the lateral decubitus position. Transrectal ultrasound probe was inserted. Representative sagittal and transverse images of the prostate were taken. The prostate itself was symmetric but did show diffuse calcifications which resulted in shadowing and made assessment of any hypoechoic areas difficult. Seminal vesicles were symmetrical and unremarkable. Prostate by him was felt to be 73 cubic centimeters.  Stone block was performed with 2% lidocaine. Standard 12 sextant biopsies were performed which were individually sent for analysis. The patient no obvious complications or difficulties and was brought to recovery room stable condition.

## 2013-05-22 NOTE — Discharge Instructions (Addendum)
You may resume all medications except for aspirin. You may restart your aspirin in 2 days. You should finish your Levaquin antibiotic tomorrow  Keep followup for March 9  Contact our office if you have a fever over 101.5, shaking chills, difficulty voiding or significant bleeding from the urethra or rectum Post Anesthesia Home Care Instructions  Activity: Get plenty of rest for the remainder of the day. A responsible adult should stay with you for 24 hours following the procedure.  For the next 24 hours, DO NOT: -Drive a car -Paediatric nurse -Drink alcoholic beverages -Take any medication unless instructed by your physician -Make any legal decisions or sign important papers.  Meals: Start with liquid foods such as gelatin or soup. Progress to regular foods as tolerated. Avoid greasy, spicy, heavy foods. If nausea and/or vomiting occur, drink only clear liquids until the nausea and/or vomiting subsides. Call your physician if vomiting continues.  Special Instructions/Symptoms: Your throat may feel dry or sore from the anesthesia or the breathing tube placed in your throat during surgery. If this causes discomfort, gargle with warm salt water. The discomfort should disappear within 24 hours. Prostate Biopsy TRUS Biopsy BEFORE THE TEST   Do not take aspirin. Do not take any medicine that has aspirin in it 7 days before your biopsy.  You may be given a medicine to take on the day of your biopsy.  You may also be given a medicine or treatment to help you go poop (laxative or enema). AFTER THE TEST  Only take medicine as told by your doctor.  It is normal to have some bleeding from your rectum for the first 5 days.  You may have blood in your pee (urine) or sperm. Finding out the results of your test Ask when your test results will be ready. Make sure you get your test results. GET HELP RIGHT AWAY IF:  You have a temperature by mouth above 102 F (38.9 C), not controlled by  medicine.  You have blood in your pee for more than 5 days.  You have a lot of blood in your pee.  You have bleeding from your rectum for more than 5 days or have a lot of blood in your poop (feces).  You have severe pain. Document Released: 03/02/2009 Document Revised: 06/06/2011 Document Reviewed: 10/31/2012 [facility] Patient Information 2014 [place], [place].

## 2013-05-23 ENCOUNTER — Encounter (HOSPITAL_BASED_OUTPATIENT_CLINIC_OR_DEPARTMENT_OTHER): Payer: Self-pay | Admitting: Urology

## 2013-05-24 ENCOUNTER — Other Ambulatory Visit: Payer: Self-pay | Admitting: Family Medicine

## 2013-06-03 DIAGNOSIS — R972 Elevated prostate specific antigen [PSA]: Secondary | ICD-10-CM | POA: Diagnosis not present

## 2013-06-03 DIAGNOSIS — N401 Enlarged prostate with lower urinary tract symptoms: Secondary | ICD-10-CM | POA: Diagnosis not present

## 2013-06-03 DIAGNOSIS — N138 Other obstructive and reflux uropathy: Secondary | ICD-10-CM | POA: Diagnosis not present

## 2013-06-03 DIAGNOSIS — N411 Chronic prostatitis: Secondary | ICD-10-CM | POA: Diagnosis not present

## 2013-06-10 DIAGNOSIS — M1710 Unilateral primary osteoarthritis, unspecified knee: Secondary | ICD-10-CM | POA: Diagnosis not present

## 2013-06-11 ENCOUNTER — Ambulatory Visit (INDEPENDENT_AMBULATORY_CARE_PROVIDER_SITE_OTHER): Payer: Medicare Other | Admitting: Family Medicine

## 2013-06-11 VITALS — BP 122/78 | HR 80 | Temp 97.7°F | Resp 16 | Ht 68.0 in | Wt 204.2 lb

## 2013-06-11 DIAGNOSIS — Z23 Encounter for immunization: Secondary | ICD-10-CM

## 2013-06-11 DIAGNOSIS — M542 Cervicalgia: Secondary | ICD-10-CM | POA: Diagnosis not present

## 2013-06-11 DIAGNOSIS — G629 Polyneuropathy, unspecified: Secondary | ICD-10-CM

## 2013-06-11 DIAGNOSIS — R51 Headache: Secondary | ICD-10-CM

## 2013-06-11 DIAGNOSIS — G609 Hereditary and idiopathic neuropathy, unspecified: Secondary | ICD-10-CM | POA: Diagnosis not present

## 2013-06-11 MED ORDER — ZOSTER VACCINE LIVE 19400 UNT/0.65ML ~~LOC~~ SOLR: 0.6500 mL | Freq: Once | SUBCUTANEOUS | Status: DC

## 2013-06-11 MED ORDER — GABAPENTIN 300 MG PO CAPS: 300.0000 mg | ORAL_CAPSULE | Freq: Two times a day (BID) | ORAL | Status: DC | PRN

## 2013-06-11 NOTE — Progress Notes (Addendum)
Subjective:    Patient ID: [name] [name], male    DOB: May 24, 1945, 68 y.o.   MRN: [id number]  This chart was scribed for [name], MD by Maree Erie, ED Scribe.  Chief Complaint  Patient presents with  . Headache    radiates down into shoulder and arm ( believes it to be due to shingles )  . Medication Refill    gabapentin    PCP: [name], MD   HPI  [name] [name] is a 68 y.o. male with a history of peripheral neuropathy, takes Neurontin up to 600 mg twice a day if needed.  Peripheral Neuropathy: He is here for a medication refill of his Neurontin. He states that he takes a pill on average every week and a half when he feels a burning pain in his left lower extremity. He denies any side effects from the medication. He states his three month supply lasted about a year and a half.   Headache: He is also complaining of an unchanged, intermittent right sided headache that radiates down the back of the neck, down into shoulder and the right arm that began a few months ago. He reports small intermittent eraser sized rash on the scalp with the headache that he denies had a burning sensation associated with it. He describes the pain as when you sleep wrong and get a "crick in your neck." The pain is worsened by twisting his neck. He denies fever, rash, extremity weakness, nausea or vomiting. He reports similar symptoms in July 2013 that was diagnosed by me as shingles. He had a right sided headache as well as a few red patches on the right side of his scalp at that time. He states the pain subsided after that episode until this current episode of pain. He would like a prescription for the shingles vaccination since it has been over six months since his last episode.  Hypertriglyceridemia: He also requested to have his lipid profile checked at the end of this visit. His last lipid panel was July 2014 by Cardiology. At that lab visit, per Dr. Allison Quarry note, he planned to recheck  lipid with possible NMR panel. Discussed this with the patient and he plans on calling for follow up visit in July and will have it checked by Cardiology at that visit.  Patient Active Problem List   Diagnosis Date Noted  . Exertional shortness of breath 06/26/2012  . Polyp of colon, adenomatous 04/27/2012  . Elevated PSA 10/20/2011  . Hypertriglyceridemia 07/08/2011  . Nonischemic cardiomyopathy 09/04/2009   Past Medical History  Diagnosis Date  . Arthritis   . Nonischemic cardiomyopathy     09-04-2009  --  2D echo-EF 40-45%, Global Hypokinesis  . Hyperlipemia   . Hypertriglyceridemia   . Coronary atherosclerosis of native coronary vessel     CATH 2004--  MINIMAL NONOBSTRUCTIVE LAD/  NORMAL EF/    CARDIAC CT  03/2008  NO SIGNIFICANT DISEASE  AND  nlef  . History of melanoma excision     2011-- LEFT UPPER BACK  . History of gout     pt 05-20-2013 states stable   . Elevated PSA   . BPH (benign prostatic hypertrophy)    Past Surgical History  Procedure Laterality Date  . Transthoracic echocardiogram  09-04-2009    MILD LVH/  MILD GLOBAL HYPOKINESIS/ LVEF 40-45%/  MILD AV SCLEROSIS WITHOUT STENOSIS/  MILD PVR  . Cardiovascular stress test  10-05-1998    MILD GLOBAL HYPOKINESIS AND  ISCHEMIAIN ANTEROSEPTAL  AT [facility]/ EF 42%  . Lumbar disc surgery  09-12-2000    left  L5 -- S1  . Excision melanoma left upper back  10-14-2009  . Transrectal ultrasound prostate bx  05-23-2005  &  04-19-2001  . Cardiac catheterization  05/23/2002  &  05-23-1998   DR AL LITTLE    minimal irregularities in the LAD/  EF 50%  . Appendectomy  AS CHILD  . Prostate biopsy N/A 05/22/2013    Procedure: BIOPSY TRANSRECTAL ULTRASONIC PROSTATE (TUBP);  Surgeon: [name], MD;  Location: [facility];  Service: Urology;  Laterality: N/A;   Allergies  Allergen Reactions  . Nitroglycerin Anaphylaxis    "Cardiologist suggested he shouldn't take this med because it could kill him with his heart  condition. Lowers BP"  . Ace Inhibitors Swelling  . Lovastatin Nausea Only   Prior to Admission medications   Medication Sig Start Date End Date Taking? Authorizing Provider  allopurinol (ZYLOPRIM) 100 MG tablet Take 100 mg by mouth daily.   Yes Historical Provider, MD  fish oil-omega-3 fatty acids 1000 MG capsule Take 1 g by mouth daily.   Yes Historical Provider, MD  gabapentin (NEURONTIN) 300 MG capsule Take 300 mg by mouth as needed. 07/29/11  Yes [name], MD  Garlic TABS Take 1 tablet by mouth daily.   Yes Historical Provider, MD  Misc Natural Products (SAW PALMETTO) CAPS Take 1 capsule by mouth daily.   Yes Historical Provider, MD  Multiple Vitamins-Minerals (MULTIVITAMIN PO) Take 1 tablet by mouth daily.   Yes Historical Provider, MD  naproxen sodium (ANAPROX) 550 MG tablet Take 550 mg by mouth as needed. 02/04/13 02/04/14 Yes Ellison Carwin, MD  Pomegranate, Punica granatum, (POMEGRANATE PO) Take 1 capsule by mouth daily.   Yes Historical Provider, MD  Red Yeast Rice Extract (RED YEAST RICE PO) Take 1 capsule by mouth daily.   Yes Historical Provider, MD  sildenafil (VIAGRA) 100 MG tablet 1/2 to 1 tablet up to each day as needed. 07/08/11  Yes [name], MD  Cyanocobalamin (B-12 SL) Place 1 tablet under the tongue daily.    Historical Provider, MD  cyclobenzaprine (FLEXERIL) 10 MG tablet Take 1 tablet (10 mg total) by mouth 3 (three) times daily as needed for muscle spasms. 02/04/13   Ellison Carwin, MD   History   Social History  . Marital Status: Widowed    Spouse Name: N/A    Number of Children: N/A  . Years of Education: N/A   Occupational History  . 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   Social History Main Topics  . Smoking status: Former Smoker -- 2.00 packs/day for 20 years    Types: Cigarettes    Quit date: 03/28/1980  . Smokeless tobacco: Never Used  . Alcohol Use: No  . Drug Use: No  . Sexual Activity:  Not on file   Other Topics Concern  . Not on file   Social History Narrative   He walks on a relatively regular basis about 15 minutes at a time mostly to the discomfort. He previously had walked up a 30-40 minutes a time without problems.      Review of Systems  Constitutional: Negative for fever.  HENT: Negative for congestion.   Eyes: Negative for visual disturbance.  Respiratory: Negative for cough.   Cardiovascular: Negative for chest pain.  Gastrointestinal: Negative for nausea and vomiting.  Musculoskeletal: Positive for  arthralgias and neck pain.  Skin: Negative for rash.  Neurological: Positive for headaches. Negative for weakness.       Objective:   Physical Exam  Nursing note and vitals reviewed. Constitutional: He is oriented to person, place, and time. He appears well-developed and well-nourished. No distress.  HENT:  Head: Normocephalic and atraumatic.  Nose: Right sinus exhibits no maxillary sinus tenderness and no frontal sinus tenderness. Left sinus exhibits no maxillary sinus tenderness and no frontal sinus tenderness.  No scalp rash. Sinuses non-tender.  Eyes: EOM are normal.  Neck: Neck supple. No tracheal deviation present.  No LAD. Decreased ROM of C spine diffusely.  Cardiovascular: Normal rate, regular rhythm and normal heart sounds.  Exam reveals no gallop and no friction rub.   No murmur heard. Pulmonary/Chest: Effort normal and breath sounds normal. No respiratory distress. He has no wheezes. He has no rales.  Musculoskeletal: Normal range of motion.  Neurological: He is alert and oriented to person, place, and time.  Difficulty assessing reflexes in upper extremities but equal bilaterally. Upper extremity and grip strength equal bilaterally.  Skin: Skin is warm and dry.  Psychiatric: He has a normal mood and affect. His behavior is normal.    Filed Vitals:   06/11/13 0749  BP: 122/78  Pulse: 80  Temp: 97.7 F (36.5 C)  TempSrc: Oral    Resp: 16  Height: 5\' 8"  (1.727 m)  Weight: 204 lb 3.2 oz (92.625 kg)  SpO2: 97%        Assessment & Plan:   [name] [name] is a 68 y.o. male Peripheral neuropathy - Plan: gabapentin (NEURONTIN) 300 MG capsule - L leg, intermittent to foot, overall controlled with prn dosing of neurontin.   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  Intermittent sx's, but for months and no rash, doubt shingles. Trial of otc tylenol, neurontin BID for next 2 weeks, then recheck if persists to look at xr and other possible causes. Sooner if worse.   Rx given for shingles vaccine.    Meds ordered this encounter  Medications  . gabapentin (NEURONTIN) 300 MG capsule    Sig: Take 1 capsule (300 mg total) by mouth 2 (two) times daily as needed.    Dispense:  60 capsule    Refill:  3   Patient Instructions  I suspect your pain may be coming from your neck - possible pinched nerve or arthritis.  Try over the counter tylenol, and neuronitin up to twice per day. If not improving in the next 2 weeks - recehck to discuss other possible causes and possible xray.  Return to the clinic or go to the nearest emergency room if any of your symptoms worsen or new symptoms occur.     I personally performed the services described in this documentation, which was scribed in my presence. The recorded information has been reviewed and considered, and addended by me as needed.

## 2013-06-11 NOTE — Patient Instructions (Signed)
I suspect your pain may be coming from your neck - possible pinched nerve or arthritis.  Try over the counter tylenol, and neuronitin up to twice per day. If not improving in the next 2 weeks - recehck to discuss other possible causes and possible xray.  Return to the clinic or go to the nearest emergency room if any of your symptoms worsen or new symptoms occur.

## 2013-06-17 DIAGNOSIS — M1710 Unilateral primary osteoarthritis, unspecified knee: Secondary | ICD-10-CM | POA: Diagnosis not present

## 2013-06-24 DIAGNOSIS — M1710 Unilateral primary osteoarthritis, unspecified knee: Secondary | ICD-10-CM | POA: Diagnosis not present

## 2013-07-16 DIAGNOSIS — D485 Neoplasm of uncertain behavior of skin: Secondary | ICD-10-CM | POA: Diagnosis not present

## 2013-07-16 DIAGNOSIS — D235 Other benign neoplasm of skin of trunk: Secondary | ICD-10-CM | POA: Diagnosis not present

## 2013-07-16 DIAGNOSIS — Z85820 Personal history of malignant melanoma of skin: Secondary | ICD-10-CM | POA: Diagnosis not present

## 2013-08-15 ENCOUNTER — Ambulatory Visit: Payer: Medicare Other | Admitting: Cardiology

## 2013-11-04 ENCOUNTER — Ambulatory Visit (INDEPENDENT_AMBULATORY_CARE_PROVIDER_SITE_OTHER): Payer: Medicare Other | Admitting: Family Medicine

## 2013-11-04 ENCOUNTER — Ambulatory Visit (INDEPENDENT_AMBULATORY_CARE_PROVIDER_SITE_OTHER): Payer: Medicare Other

## 2013-11-04 ENCOUNTER — Encounter: Payer: Self-pay | Admitting: Family Medicine

## 2013-11-04 VITALS — BP 118/68 | HR 82 | Temp 97.6°F | Resp 16 | Ht 67.5 in | Wt 200.6 lb

## 2013-11-04 DIAGNOSIS — M531 Cervicobrachial syndrome: Secondary | ICD-10-CM | POA: Diagnosis not present

## 2013-11-04 DIAGNOSIS — E785 Hyperlipidemia, unspecified: Secondary | ICD-10-CM

## 2013-11-04 DIAGNOSIS — R51 Headache: Secondary | ICD-10-CM

## 2013-11-04 DIAGNOSIS — G609 Hereditary and idiopathic neuropathy, unspecified: Secondary | ICD-10-CM

## 2013-11-04 DIAGNOSIS — M542 Cervicalgia: Secondary | ICD-10-CM

## 2013-11-04 DIAGNOSIS — G629 Polyneuropathy, unspecified: Secondary | ICD-10-CM

## 2013-11-04 DIAGNOSIS — M5481 Occipital neuralgia: Secondary | ICD-10-CM

## 2013-11-04 DIAGNOSIS — R21 Rash and other nonspecific skin eruption: Secondary | ICD-10-CM

## 2013-11-04 MED ORDER — GABAPENTIN 300 MG PO CAPS: 300.0000 mg | ORAL_CAPSULE | Freq: Two times a day (BID) | ORAL | Status: DC | PRN

## 2013-11-04 NOTE — Patient Instructions (Signed)
Try the gabapentin up to 2 twice per day for the neck pain and headache. We are referring you to Dr. Nelva Bush.  You should receive a call or letter about your lab results within the next week to 10 days. Remind them if they call if you want a copy sent.  If any rash, blister, or wound on scalp - return right away for me to look at this, but can also discuss with your dermatologist.   Return to the clinic or go to the nearest emergency room if any of your symptoms worsen or new symptoms occur.

## 2013-11-04 NOTE — Progress Notes (Signed)
Subjective:   This chart was scribed for [name] Agreste, MD by Forrestine Him, [facility] Scribe. This patient was seen in room 27 and the patient's care was started 3:27 PM.    Patient ID: [name] [name], male    DOB: 13-Oct-1945, 68 y.o.   MRN: [id number]  Chief Complaint  Patient presents with  . blood work    Ambulance person  . Rash    rash on top of head and feels like he has shingles also having a headache    HPI  HPI Comments: [name] [name] is a 68 y.o. male who presents to [facility] requesting a cholesterol check but also with acute concern for a rash today.  Peripheral Neopathy:  Last seen in March 2015 for L leg and foot symptoms. Stable with PRN dosing of Neurontin. States he is taking this medication a couple times a week but every day if very active throughout the week. Was also seen for R sided neck pain during March visit. 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 Treated with Tylenol and Neurontin and instructed to follow up in 2 weeks if persisted . Did prescribed Zostavax at March visit and pt followed up for shot.  Hyperlipidemia: Following by Dr. Shanon Brow [facility]  in past. Discussed following up with Dr. Ellyn Hack in July of this year as NMR was planned to be checked. Pt states he had to reschedule his appointment due to him being out of town during [place] initial visit. Last lipids and CHL below.  Lab Results  Component Value Date   CHOL 203* 10/02/2012   HDL 34* 10/02/2012   LDLCALC 92 10/02/2012   TRIG 385* 10/02/2012   CHOLHDL 6.0 10/02/2012    Pt takes Red Yeast Rice as well as multiple other herbal medications.  Headache: Pt reports intermittent, moderate generalized HA that radiates into the neck at times that has been ongoing for some time now. Describes discomfort as sharp. States when HA comes on, lasts for a 2-3 weeks. This pain is exacerbated with certain  movements of the neck. No alleviating factors at this time. He has not tried any OTC medications or any home remedies to help manage symptoms.    Rash: Pt mentions a rash noted to the top the scalp onset 2-5 day. States area is intermittent and reports sudden "flare ups". He has not tried any OTC topical treatments or home remedies to help with symptoms. No nausea, vomiting, weakness, visual changes, dizziness, or abdominal pain. He admits to family history of skin cancer. Father had an area on his face removed several years ago. He is followed by Dr. Ananias Pilgrim and states he follows up every 6 months.  Patient Active Problem List   Diagnosis Date Noted  . Exertional shortness of breath 06/26/2012  . Polyp of colon, adenomatous 04/27/2012  . Elevated PSA 10/20/2011  . Hypertriglyceridemia 07/08/2011  . Nonischemic cardiomyopathy 09/04/2009   Past Medical History  Diagnosis Date  . Arthritis   . Nonischemic cardiomyopathy     09-04-2009  --  2D echo-EF 40-45%, Global Hypokinesis  . Hyperlipemia   . Hypertriglyceridemia   . Coronary atherosclerosis of native coronary vessel     CATH 2004--  MINIMAL NONOBSTRUCTIVE LAD/  NORMAL EF/    CARDIAC CT  03/2008  NO SIGNIFICANT DISEASE  AND  nlef  . History of melanoma excision     2011-- LEFT UPPER  BACK  . History of gout     pt 05-20-2013 states stable   . Elevated PSA   . BPH (benign prostatic hypertrophy)    Past Surgical History  Procedure Laterality Date  . Transthoracic echocardiogram  09-04-2009    MILD LVH/  MILD GLOBAL HYPOKINESIS/ LVEF 40-45%/  MILD AV SCLEROSIS WITHOUT STENOSIS/  MILD PVR  . Cardiovascular stress test  10-05-1998    MILD GLOBAL HYPOKINESIS AND ISCHEMIAIN ANTEROSEPTAL  AT [facility]/ EF 42%  . Lumbar disc surgery  09-12-2000    left  L5 -- S1  . Excision melanoma left upper back  10-14-2009  . Transrectal ultrasound prostate bx  05-23-2005  &  04-19-2001  . Cardiac catheterization  05/23/2002  &  05-23-1998    DR AL LITTLE    minimal irregularities in the LAD/  EF 50%  . Appendectomy  AS CHILD  . Prostate biopsy N/A 05/22/2013    Procedure: BIOPSY TRANSRECTAL ULTRASONIC PROSTATE (TUBP);  Surgeon: [name], MD;  Location: [facility];  Service: Urology;  Laterality: N/A;   Allergies  Allergen Reactions  . Nitroglycerin Anaphylaxis    "Cardiologist suggested he shouldn't take this med because it could kill him with his heart condition. Lowers BP"  . Ace Inhibitors Swelling  . Lovastatin Nausea Only   Prior to Admission medications   Medication Sig Start Date End Date Taking? Authorizing Provider  Cyanocobalamin (B-12 SL) Place 1 tablet under the tongue daily.   Yes Historical Provider, MD  fish oil-omega-3 fatty acids 1000 MG capsule Take 1 g by mouth daily.   Yes Historical Provider, MD  gabapentin (NEURONTIN) 300 MG capsule Take 1 capsule (300 mg total) by mouth 2 (two) times daily as needed. 06/11/13  Yes [name] Agreste, MD  Garlic TABS Take 1 tablet by mouth daily.   Yes Historical Provider, MD  Misc Natural Products (SAW PALMETTO) CAPS Take 1 capsule by mouth daily.   Yes Historical Provider, MD  Multiple Vitamins-Minerals (MULTIVITAMIN PO) Take 1 tablet by mouth daily.   Yes Historical Provider, MD  Pomegranate, [place] granatum, (POMEGRANATE PO) Take 1 capsule by mouth daily.   Yes Historical Provider, MD  Red Yeast Rice Extract (RED YEAST RICE PO) Take 1 capsule by mouth daily.   Yes Historical Provider, MD  sildenafil (VIAGRA) 100 MG tablet 1/2 to 1 tablet up to each day as needed. 07/08/11  Yes [name] Agreste, MD  allopurinol (ZYLOPRIM) 100 MG tablet Take 100 mg by mouth daily.    Historical Provider, MD  cyclobenzaprine (FLEXERIL) 10 MG tablet Take 1 tablet (10 mg total) by mouth 3 (three) times daily as needed for muscle spasms. 02/04/13   Ellison Carwin, MD  naproxen sodium (ANAPROX) 550 MG tablet Take 550 mg by mouth as needed. 02/04/13 02/04/14  Ellison Carwin, MD  zoster vaccine live, PF, (ZOSTAVAX) 47829 UNT/0.65ML injection Inject 19,400 Units into the skin once. 06/11/13   [name] Agreste, MD   History   Social History  . Marital Status: Widowed    Spouse Name: N/A    Number of Children: N/A  . Years of Education: N/A   Occupational History  . 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   Social History Main Topics  . Smoking status: Former Smoker -- 2.00 packs/day for 20 years    Types: Cigarettes    Quit date: 03/28/1980  . Smokeless tobacco: Never Used  . Alcohol Use: No  .  Drug Use: No  . Sexual Activity: Not on file   Other Topics Concern  . Not on file   Social History Narrative   He walks on a relatively regular basis about 15 minutes at a time mostly to the discomfort. He previously had walked up a 30-40 minutes a time without problems.     Review of Systems  Constitutional: Negative for fever and chills.  Eyes: Negative for photophobia and visual disturbance.  Gastrointestinal: Negative for nausea, vomiting and abdominal pain.  Skin: Positive for rash.  Neurological: Positive for headaches. Negative for dizziness, weakness and light-headedness.    Filed Vitals:   11/04/13 1503  BP: 118/68  Pulse: 82  Temp: 97.6 F (36.4 C)  TempSrc: Oral  Resp: 16  Height: 5' 7.5" (1.715 m)  Weight: 200 lb 9.6 oz (90.992 kg)  SpO2: 95%    Objective:  Physical Exam  Nursing note and vitals reviewed. Constitutional: He is oriented to person, place, and time. He appears well-developed and well-nourished.  HENT:  Head: Normocephalic.  Eyes: EOM are normal.  Neck: Normal range of motion.  Cardiovascular: Normal rate, regular rhythm and normal heart sounds.  Exam reveals no gallop and no friction rub.   No murmur heard. Pulmonary/Chest: Effort normal and breath sounds normal. No respiratory distress. He has no wheezes. He has no rales.  Abdominal: He exhibits no distension.    Musculoskeletal: Normal range of motion. He exhibits no tenderness.  No lower extremity edema Slight decrease ROM to cervical spine but no pain noted  Neurological: He is alert and oriented to person, place, and time.  Sensation intact to the feet Difficulty obtaining reflexes but equal in upper extremities   Skin:  Slight erythema to dorsal scalp of hairline No vesicles noted  Psychiatric: He has a normal mood and affect.    [facility] reading (PRIMARY) of C-Spine by Dr. [name], MD: Degenerative disease with decreased space most notably to C3-C4 and C5-C6 without apparent shift   Assessment & Plan:   CANDELARIO STEPPE is a 68 y.o. 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  Will refer to Dr. Nelva Bush for eval and to consider if other testing/imaging or possible injection indicated. Ok to incr gabapentin to 1-2 BID prn (300-600mg  dose). RTC precautions.   UDJSHFWY(637.8) - Plan: Ambulatory referral to Physical Medicine Rehab  -as above.  rtc precautions.   Other and unspecified hyperlipidemia - Plan: Lipid panel  -lipids pending, but may need to follow up with cardiology to discuss further including possible NMR lipoprofile. Ok to cont red yeast rice at this time.   Rash, skin  - scalp - ?Seborrhea. recommended to follow up with dermatologist, but if any interval changes as below - RTC for recheck.    Meds ordered this encounter  Medications  . gabapentin (NEURONTIN) 300 MG capsule    Sig: Take 1-2 capsules (300-600 mg total) by mouth 2 (two) times daily as needed.    Dispense:  120 capsule    Refill:  3   Patient Instructions  Try the gabapentin  up to 2 twice per day for the neck pain and headache. We are referring you to Dr. Nelva Bush.  You should receive a call or letter about your  lab results within the next week to 10 days. Remind them if they call if you want a copy sent.  If any rash, blister, or wound on scalp - return right away for me to look at this, but can also discuss with your dermatologist.   Return to the clinic or go to the nearest emergency room if any of your symptoms worsen or new symptoms occur.        I personally performed the services described in this documentation, which was scribed in my presence. The recorded information has been reviewed and is accurate.

## 2013-11-05 LAB — LIPID PANEL
Cholesterol: 204 mg/dL — ABNORMAL HIGH (ref 0–200)
HDL: 34 mg/dL — ABNORMAL LOW (ref >39)
LDL Cholesterol: 112 mg/dL — ABNORMAL HIGH (ref 0–99)
Total CHOL/HDL Ratio: 6 Ratio
Triglycerides: 290 mg/dL — ABNORMAL HIGH (ref <150)
VLDL: 58 mg/dL — ABNORMAL HIGH (ref 0–40)

## 2013-11-18 ENCOUNTER — Telehealth: Payer: Self-pay | Admitting: *Deleted

## 2013-11-18 NOTE — Telephone Encounter (Signed)
Pt calling to inquire about labs on 11/04/13 ; doesn't look like they have been reviewed yet. Please advise.

## 2013-11-19 ENCOUNTER — Encounter: Payer: Self-pay | Admitting: Radiology

## 2013-12-03 DIAGNOSIS — N139 Obstructive and reflux uropathy, unspecified: Secondary | ICD-10-CM | POA: Diagnosis not present

## 2013-12-03 DIAGNOSIS — N401 Enlarged prostate with lower urinary tract symptoms: Secondary | ICD-10-CM | POA: Diagnosis not present

## 2013-12-03 DIAGNOSIS — N138 Other obstructive and reflux uropathy: Secondary | ICD-10-CM | POA: Diagnosis not present

## 2013-12-10 DIAGNOSIS — N139 Obstructive and reflux uropathy, unspecified: Secondary | ICD-10-CM | POA: Diagnosis not present

## 2013-12-10 DIAGNOSIS — N401 Enlarged prostate with lower urinary tract symptoms: Secondary | ICD-10-CM | POA: Diagnosis not present

## 2013-12-10 DIAGNOSIS — N138 Other obstructive and reflux uropathy: Secondary | ICD-10-CM | POA: Diagnosis not present

## 2013-12-10 DIAGNOSIS — R972 Elevated prostate specific antigen [PSA]: Secondary | ICD-10-CM | POA: Diagnosis not present

## 2013-12-10 DIAGNOSIS — N529 Male erectile dysfunction, unspecified: Secondary | ICD-10-CM | POA: Diagnosis not present

## 2013-12-11 DIAGNOSIS — M47812 Spondylosis without myelopathy or radiculopathy, cervical region: Secondary | ICD-10-CM | POA: Diagnosis not present

## 2013-12-18 ENCOUNTER — Encounter: Payer: Self-pay | Admitting: Family Medicine

## 2013-12-18 DIAGNOSIS — R972 Elevated prostate specific antigen [PSA]: Secondary | ICD-10-CM

## 2013-12-19 DIAGNOSIS — M47812 Spondylosis without myelopathy or radiculopathy, cervical region: Secondary | ICD-10-CM | POA: Insufficient documentation

## 2013-12-26 ENCOUNTER — Encounter: Payer: Medicare Other | Admitting: Family Medicine

## 2013-12-26 ENCOUNTER — Ambulatory Visit (INDEPENDENT_AMBULATORY_CARE_PROVIDER_SITE_OTHER): Payer: Medicare Other | Admitting: Cardiology

## 2013-12-26 ENCOUNTER — Ambulatory Visit (INDEPENDENT_AMBULATORY_CARE_PROVIDER_SITE_OTHER): Payer: Medicare Other | Admitting: Family Medicine

## 2013-12-26 ENCOUNTER — Encounter: Payer: Self-pay | Admitting: Cardiology

## 2013-12-26 VITALS — BP 122/72 | HR 91 | Temp 97.9°F | Resp 18 | Ht 67.0 in | Wt 201.2 lb

## 2013-12-26 VITALS — BP 122/72 | HR 91 | Temp 97.9°F | Resp 18 | Ht 67.0 in | Wt 201.0 lb

## 2013-12-26 VITALS — BP 124/68 | HR 80 | Ht 67.0 in | Wt 201.0 lb

## 2013-12-26 DIAGNOSIS — J988 Other specified respiratory disorders: Secondary | ICD-10-CM | POA: Diagnosis not present

## 2013-12-26 DIAGNOSIS — J22 Unspecified acute lower respiratory infection: Secondary | ICD-10-CM

## 2013-12-26 DIAGNOSIS — Z23 Encounter for immunization: Secondary | ICD-10-CM

## 2013-12-26 DIAGNOSIS — E669 Obesity, unspecified: Secondary | ICD-10-CM | POA: Diagnosis not present

## 2013-12-26 DIAGNOSIS — R0602 Shortness of breath: Secondary | ICD-10-CM

## 2013-12-26 DIAGNOSIS — J029 Acute pharyngitis, unspecified: Secondary | ICD-10-CM

## 2013-12-26 DIAGNOSIS — E781 Pure hyperglyceridemia: Secondary | ICD-10-CM

## 2013-12-26 DIAGNOSIS — R05 Cough: Secondary | ICD-10-CM | POA: Diagnosis not present

## 2013-12-26 DIAGNOSIS — I429 Cardiomyopathy, unspecified: Secondary | ICD-10-CM | POA: Diagnosis not present

## 2013-12-26 DIAGNOSIS — R059 Cough, unspecified: Secondary | ICD-10-CM

## 2013-12-26 DIAGNOSIS — I428 Other cardiomyopathies: Secondary | ICD-10-CM

## 2013-12-26 LAB — POCT RAPID STREP A (OFFICE): Rapid Strep A Screen: NEGATIVE

## 2013-12-26 MED ORDER — AZITHROMYCIN 250 MG PO TABS: ORAL_TABLET | ORAL | Status: DC

## 2013-12-26 NOTE — Patient Instructions (Signed)
Saline nasal spray atleast 4 times per day, over the counter mucinex or mucinex DM, drink plenty of fluids. If cough not improving - I sent Zpak to your pharmacy - can wait to fill this for 2-3 days, to see if this will improve as a typical virus.  Return to the clinic or go to the nearest emergency room if any of your symptoms worsen or new symptoms occur.  Cough, Adult  A cough is a reflex that helps clear your throat and airways. It can help heal the body or may be a reaction to an irritated airway. A cough may only last 2 or 3 weeks (acute) or may last more than 8 weeks (chronic).  CAUSES Acute cough:  Viral or bacterial infections. Chronic cough:  Infections.  Allergies.  Asthma.  Post-nasal drip.  Smoking.  Heartburn or acid reflux.  Some medicines.  Chronic lung problems (COPD).  Cancer. SYMPTOMS   Cough.  Fever.  Chest pain.  Increased breathing rate.  High-pitched whistling sound when breathing (wheezing).  Colored mucus that you cough up (sputum). TREATMENT   A bacterial cough may be treated with antibiotic medicine.  A viral cough must run its course and will not respond to antibiotics.  Your caregiver may recommend other treatments if you have a chronic cough. HOME CARE INSTRUCTIONS   Only take over-the-counter or prescription medicines for pain, discomfort, or fever as directed by your caregiver. Use cough suppressants only as directed by your caregiver.  Use a cold steam vaporizer or humidifier in your bedroom or home to help loosen secretions.  Sleep in a semi-upright position if your cough is worse at night.  Rest as needed.  Stop smoking if you smoke. SEEK IMMEDIATE MEDICAL CARE IF:   You have pus in your sputum.  Your cough starts to worsen.  You cannot control your cough with suppressants and are losing sleep.  You begin coughing up blood.  You have difficulty breathing.  You develop pain which is getting worse or is  uncontrolled with medicine.  You have a fever. MAKE SURE YOU:   Understand these instructions.  Will watch your condition.  Will get help right away if you are not doing well or get worse. Document Released: 09/10/2010 Document Revised: 06/06/2011 Document Reviewed: 09/10/2010 [facility] Patient Information 2015 [place], [place]. This information is not intended to replace advice given to you by your health care provider. Make sure you discuss any questions you have with your health care provider. Sore Throat A sore throat is pain, burning, irritation, or scratchiness of the throat. There is often pain or tenderness when swallowing or talking. A sore throat may be accompanied by other symptoms, such as coughing, sneezing, fever, and swollen neck glands. A sore throat is often the first sign of another sickness, such as a cold, flu, strep throat, or mononucleosis (commonly known as mono). Most sore throats go away without medical treatment. CAUSES  The most common causes of a sore throat include:  A viral infection, such as a cold, flu, or mono.  A bacterial infection, such as strep throat, tonsillitis, or whooping cough.  Seasonal allergies.  Dryness in the air.  Irritants, such as smoke or pollution.  Gastroesophageal reflux disease (GERD). HOME CARE INSTRUCTIONS   Only take over-the-counter medicines as directed by your caregiver.  Drink enough fluids to keep your urine clear or pale yellow.  Rest as needed.  Try using throat sprays, lozenges, or sucking on hard candy to ease any pain (  if older than 4 years or as directed).  Sip warm liquids, such as broth, herbal tea, or warm water with honey to relieve pain temporarily. You may also eat or drink cold or frozen liquids such as frozen ice pops.  Gargle with salt water (mix 1 tsp salt with 8 oz of water).  Do not smoke and avoid secondhand smoke.  Put a cool-mist humidifier in your bedroom at night to moisten the air. You  can also turn on a hot shower and sit in the bathroom with the door closed for 5-10 minutes. SEEK IMMEDIATE MEDICAL CARE IF:  You have difficulty breathing.  You are unable to swallow fluids, soft foods, or your saliva.  You have increased swelling in the throat.  Your sore throat does not get better in 7 days.  You have nausea and vomiting.  You have a fever or persistent symptoms for more than 2-3 days.  You have a fever and your symptoms suddenly get worse. MAKE SURE YOU:   Understand these instructions.  Will watch your condition.  Will get help right away if you are not doing well or get worse. Document Released: 04/21/2004 Document Revised: 02/29/2012 Document Reviewed: 11/20/2011 [facility] Patient Information 2015 [place], [place]. This information is not intended to replace advice given to you by your health care provider. Make sure you discuss any questions you have with your health care provider.

## 2013-12-26 NOTE — Progress Notes (Signed)
Subjective:    Patient ID: [name] [name], male    DOB: March 05, 1946, 68 y.o.   MRN: [id number] This chart was scribed for [name], MD by Martinique Peace, ED Scribe. The patient was seen in [facility]. The patient's care was started at 6:15 PM.  HPI HPI Comments: [name] [name] is a 68 y.o. male who presents to the [facility] complaining of cough, chest congestion, and nasal congestion onset 1 week ago.  Pt goes on to complain of associated sore throat as well that he reports has worsened today. Pt was Initially was here for flu shot, but wanted to get checked for current symptoms. He states that he feel like he has a "nagging" cold that will not go away.  Pt states the cold doesn't seem to worsening anymore, but denies noticing any improvement. He describes past week symptoms as being "up and down" over the course of the week. He denies any SOB or fever in past few days. Pt notes he has tried taking Nyquil without relief. Pt received flu shot today. Pt is former smoker.   Patient Active Problem List   Diagnosis Date Noted  . 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  . Exertional shortness of breath 06/26/2012  . Polyp of colon, adenomatous 04/27/2012  . Elevated PSA 10/20/2011  . Hypertriglyceridemia 07/08/2011  . Nonischemic cardiomyopathy 09/04/2009   Past Medical History  Diagnosis Date  . Arthritis   . Nonischemic cardiomyopathy     09-04-2009  --  2D echo-EF 40-45%, Global Hypokinesis  . Hyperlipemia   . Hypertriglyceridemia   . Coronary atherosclerosis of native coronary vessel     CATH 2004--  MINIMAL NONOBSTRUCTIVE LAD/  NORMAL EF/    CARDIAC CT  03/2008  NO SIGNIFICANT DISEASE  AND  nlef  . History of melanoma excision     2011-- LEFT UPPER BACK  . History of gout     pt 05-20-2013 states stable   . Elevated PSA   . BPH (benign prostatic hypertrophy)   . Personal history of arthritis   . Personal history of other diseases of circulatory system    Past Surgical History    Procedure Laterality Date  . Transthoracic echocardiogram  09-04-2009    MILD LVH/  MILD GLOBAL HYPOKINESIS/ LVEF 40-45%/  MILD AV SCLEROSIS WITHOUT STENOSIS/  MILD PVR  . Cardiovascular stress test  10-05-1998    MILD GLOBAL HYPOKINESIS AND ISCHEMIAIN ANTEROSEPTAL  AT [facility]/ EF 42%  . Lumbar disc surgery  09-12-2000    left  L5 -- S1  . Excision melanoma left upper back  10-14-2009  . Transrectal ultrasound prostate bx  05-23-2005  &  04-19-2001  . Cardiac catheterization  05/23/2002  &  05-23-1998   DR AL LITTLE    minimal irregularities in the LAD/  EF 50%  . Appendectomy  AS CHILD  . Prostate biopsy N/A 05/22/2013    Procedure: BIOPSY TRANSRECTAL ULTRASONIC PROSTATE (TUBP);  Surgeon: [name], MD;  Location: [facility];  Service: Urology;  Laterality: N/A;   Allergies  Allergen Reactions  . Nitroglycerin Anaphylaxis    "Cardiologist suggested he shouldn't take this med because it could kill him with his heart condition. Lowers BP"  . Ace Inhibitors Swelling  . Lovastatin Nausea Only   Prior to Admission medications   Medication Sig Start Date End Date Taking? Authorizing Provider  allopurinol (ZYLOPRIM) 100 MG tablet Take 100 mg by mouth as needed.     Historical  Provider, MD  azithromycin (ZITHROMAX) 250 MG tablet Take 2 pills by mouth on day 1, then 1 pill by mouth per day on days 2 through 5. 12/26/13   [name], MD  Cyanocobalamin (B-12 SL) Place 1 tablet under the tongue daily.    Historical Provider, MD  fish oil-omega-3 fatty acids 1000 MG capsule Take 1 g by mouth daily.    Historical Provider, MD  gabapentin (NEURONTIN) 300 MG capsule Take 1-2 capsules (300-600 mg total) by mouth 2 (two) times daily as needed. 11/04/13   [name], MD  Garlic TABS Take 1 tablet by mouth daily.    Historical Provider, MD  Misc Natural Products (SAW PALMETTO) CAPS Take 1 capsule by mouth daily.    Historical Provider, MD  Multiple Vitamins-Minerals  (MULTIVITAMIN PO) Take 1 tablet by mouth daily.    Historical Provider, MD  naproxen sodium (ANAPROX) 550 MG tablet Take 550 mg by mouth as needed. 02/04/13 02/04/14  Roselee Culver, MD  Pomegranate, Punica granatum, (POMEGRANATE PO) Take 1 capsule by mouth daily.    Historical Provider, MD  Red Yeast Rice Extract (RED YEAST RICE PO) Take 1 capsule by mouth daily.    Historical Provider, MD  sildenafil (VIAGRA) 100 MG tablet 1/2 to 1 tablet up to each day as needed. 07/08/11   [name], MD  zoster vaccine live, PF, (ZOSTAVAX) 40981 UNT/0.65ML injection Inject 19,400 Units into the skin once. 06/11/13   [name], MD   History   Social History  . Marital Status: Widowed    Spouse Name: N/A    Number of Children: N/A  . Years of Education: N/A   Occupational History  . 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   Social History Main Topics  . Smoking status: Former Smoker -- 2.00 packs/day for 20 years    Types: Cigarettes    Quit date: 03/28/1980  . Smokeless tobacco: Never Used  . Alcohol Use: No  . Drug Use: No  . Sexual Activity: Not on file   Other Topics Concern  . Not on file   Social History Narrative   He walks on a relatively regular basis about 15 minutes at a time mostly to the discomfort. He previously had walked up a 30-40 minutes a time without problems.       Review of Systems  Constitutional: Negative for fever, fatigue and unexpected weight change.  HENT: Positive for congestion and sore throat.   Eyes: Negative for visual disturbance.  Respiratory: Positive for cough. Negative for chest tightness and shortness of breath.        Positive chest congestion.   Cardiovascular: Negative for chest pain, palpitations and leg swelling.  Gastrointestinal: Negative for abdominal pain and blood in stool.  Neurological: Negative for dizziness, light-headedness and headaches.       Objective:   Physical Exam    Vitals reviewed. Constitutional: He is oriented to person, place, and time. He appears well-developed and well-nourished.  HENT:  Head: Normocephalic and atraumatic.  Right Ear: Tympanic membrane, external ear and ear canal normal.  Left Ear: Tympanic membrane, external ear and ear canal normal.  Nose: No rhinorrhea.  Mouth/Throat: Oropharynx is clear and moist and mucous membranes are normal. No oropharyngeal exudate or posterior oropharyngeal erythema.  Sinuses are non-tender. Negative LAD.  Eyes: Conjunctivae are normal. Pupils are equal, round, and reactive to light.  Neck: Neck supple.  Cardiovascular: Normal rate, regular  rhythm, normal heart sounds and intact distal pulses.   No murmur heard. Pulmonary/Chest: Effort normal and breath sounds normal. He has no wheezes. He has no rhonchi. He has no rales.  Abdominal: Soft. There is no tenderness.  Lymphadenopathy:    He has no cervical adenopathy.  Neurological: He is alert and oriented to person, place, and time.  Skin: Skin is warm and dry. No rash noted.  Psychiatric: He has a normal mood and affect. His behavior is normal.   Filed Vitals:   12/26/13 1909  BP: 122/72  Pulse: 91  Temp: 97.9 F (36.6 C)  Resp: 18  Height: 5\' 7"  (1.702 m)  Weight: 201 lb (91.173 kg)  SpO2: 96%     Results for orders placed in visit on 12/26/13  POCT RAPID STREP A (OFFICE)      Result Value Ref Range   Rapid Strep A Screen Negative  Negative        Assessment & Plan:   [name] [name] is a 68 y.o. male Sore throat - Plan: POCT rapid strep A, Culture, Group A Strep, azithromycin (ZITHROMAX) 250 MG tablet  Cough - Plan: azithromycin (ZITHROMAX) 250 MG tablet  LRTI (lower respiratory tract infection) - Plan: azithromycin (ZITHROMAX) 250 MG tablet  Viral URI likely initially, but persistent cough/LRTI possible now- early bronchitis.  -sx care with mucinex, fluids, saline ns.  -if not improving in next few days, can start Zpak.    -throat cx sent.   -rtc precautions.   Meds ordered this encounter  Medications  . azithromycin (ZITHROMAX) 250 MG tablet    Sig: Take 2 pills by mouth on day 1, then 1 pill by mouth per day on days 2 through 5.    Dispense:  6 tablet    Refill:  0   Patient Instructions  Saline nasal spray atleast 4 times per day, over the counter mucinex or mucinex DM, drink plenty of fluids. If cough not improving - I sent Zpak to your pharmacy - can wait to fill this for 2-3 days, to see if this will improve as a typical virus.  Return to the clinic or go to the nearest emergency room if any of your symptoms worsen or new symptoms occur.  Cough, Adult  A cough is a reflex that helps clear your throat and airways. It can help heal the body or may be a reaction to an irritated airway. A cough may only last 2 or 3 weeks (acute) or may last more than 8 weeks (chronic).  CAUSES Acute cough:  Viral or bacterial infections. Chronic cough:  Infections.  Allergies.  Asthma.  Post-nasal drip.  Smoking.  Heartburn or acid reflux.  Some medicines.  Chronic lung problems (COPD).  Cancer. SYMPTOMS   Cough.  Fever.  Chest pain.  Increased breathing rate.  High-pitched whistling sound when breathing (wheezing).  Colored mucus that you cough up (sputum). TREATMENT   A bacterial cough may be treated with antibiotic medicine.  A viral cough must run its course and will not respond to antibiotics.  Your caregiver may recommend other treatments if you have a chronic cough. HOME CARE INSTRUCTIONS   Only take over-the-counter or prescription medicines for pain, discomfort, or fever as directed by your caregiver. Use cough suppressants only as directed by your caregiver.  Use a cold steam vaporizer or humidifier in your bedroom or home to help loosen secretions.  Sleep in a semi-upright position if your cough is worse at night.  Rest as needed.  Stop smoking if you smoke. SEEK  IMMEDIATE MEDICAL CARE IF:   You have pus in your sputum.  Your cough starts to worsen.  You cannot control your cough with suppressants and are losing sleep.  You begin coughing up blood.  You have difficulty breathing.  You develop pain which is getting worse or is uncontrolled with medicine.  You have a fever. MAKE SURE YOU:   Understand these instructions.  Will watch your condition.  Will get help right away if you are not doing well or get worse. Document Released: 09/10/2010 Document Revised: 06/06/2011 Document Reviewed: 09/10/2010 [facility] Patient Information 2015 [place], [place]. This information is not intended to replace advice given to you by your health care provider. Make sure you discuss any questions you have with your health care provider. Sore Throat A sore throat is pain, burning, irritation, or scratchiness of the throat. There is often pain or tenderness when swallowing or talking. A sore throat may be accompanied by other symptoms, such as coughing, sneezing, fever, and swollen neck glands. A sore throat is often the first sign of another sickness, such as a cold, flu, strep throat, or mononucleosis (commonly known as mono). Most sore throats go away without medical treatment. CAUSES  The most common causes of a sore throat include:  A viral infection, such as a cold, flu, or mono.  A bacterial infection, such as strep throat, tonsillitis, or whooping cough.  Seasonal allergies.  Dryness in the air.  Irritants, such as smoke or pollution.  Gastroesophageal reflux disease (GERD). HOME CARE INSTRUCTIONS   Only take over-the-counter medicines as directed by your caregiver.  Drink enough fluids to keep your urine clear or pale yellow.  Rest as needed.  Try using throat sprays, lozenges, or sucking on hard candy to ease any pain (if older than 4 years or as directed).  Sip warm liquids, such as broth, herbal tea, or warm water with honey to relieve  pain temporarily. You may also eat or drink cold or frozen liquids such as frozen ice pops.  Gargle with salt water (mix 1 tsp salt with 8 oz of water).  Do not smoke and avoid secondhand smoke.  Put a cool-mist humidifier in your bedroom at night to moisten the air. You can also turn on a hot shower and sit in the bathroom with the door closed for 5-10 minutes. SEEK IMMEDIATE MEDICAL CARE IF:  You have difficulty breathing.  You are unable to swallow fluids, soft foods, or your saliva.  You have increased swelling in the throat.  Your sore throat does not get better in 7 days.  You have nausea and vomiting.  You have a fever or persistent symptoms for more than 2-3 days.  You have a fever and your symptoms suddenly get worse. MAKE SURE YOU:   Understand these instructions.  Will watch your condition.  Will get help right away if you are not doing well or get worse. Document Released: 04/21/2004 Document Revised: 02/29/2012 Document Reviewed: 11/20/2011 [facility] Patient Information 2015 [place], [place]. This information is not intended to replace advice given to you by your health care provider. Make sure you discuss any questions you have with your health care provider.     I personally performed the services described in this documentation, which was scribed in my presence. The recorded information has been reviewed and considered, and addended by me as needed.

## 2013-12-26 NOTE — Patient Instructions (Signed)
Your physician wants you to follow-up in: 1 year with Dr Harding.  You will receive a reminder letter in the mail two months in advance. If you don't receive a letter, please call our office to schedule the follow-up appointment.  

## 2013-12-28 ENCOUNTER — Encounter: Payer: Self-pay | Admitting: Family Medicine

## 2013-12-28 NOTE — Progress Notes (Signed)
This encounter was created in error - please disregard.

## 2013-12-29 ENCOUNTER — Encounter: Payer: Self-pay | Admitting: Cardiology

## 2013-12-29 DIAGNOSIS — E669 Obesity, unspecified: Secondary | ICD-10-CM | POA: Insufficient documentation

## 2013-12-29 LAB — CULTURE, GROUP A STREP: Organism ID, Bacteria: NORMAL

## 2013-12-29 NOTE — Assessment & Plan Note (Signed)
He was intolerant of statins in the past. His lipid panel is actually not that bad dissection the triglycerides. We talked about importance of cutting back on his starchy and sugary foods.

## 2013-12-29 NOTE — Progress Notes (Signed)
PCP: [name], MD  Clinic Note: Chief Complaint  Patient presents with  . 59 MONTHS VISIT    NO CHEST PAIN , NO SOB  , NO EDEMA    HPI: [name] [name] is a 68 y.o. male with a PMH below who presents today for 1 year follow-up of Moderate Non-ischemic Cardiomyoipathy.  Past Medical History  Diagnosis Date  . Arthritis   . Nonischemic cardiomyopathy     09-04-2009  --  2D echo-EF 40-45%, Global Hypokinesis  . Hyperlipemia   . Hypertriglyceridemia   . Coronary atherosclerosis of native coronary vessel     CATH 2004--  MINIMAL NONOBSTRUCTIVE LAD/  NORMAL EF/    CARDIAC CT  03/2008  NO SIGNIFICANT DISEASE  AND  nlef  . History of melanoma excision     2011-- LEFT UPPER BACK  . History of gout     pt 05-20-2013 states stable   . Elevated PSA   . BPH (benign prostatic hypertrophy)   . Personal history of arthritis   . Personal history of other diseases of circulatory system     Prior Cardiac Evaluation/Procedure History: Procedure Laterality Date  . Transthoracic echocardiogram  09-04-2009    MILD LVH/  MILD GLOBAL HYPOKINESIS/ LVEF 40-45%/  MILD AV SCLEROSIS WITHOUT STENOSIS/  MILD PVR  . Cardiovascular stress test  10-05-1998    MILD GLOBAL HYPOKINESIS AND ISCHEMIAIN ANTEROSEPTAL  AT [facility]/ EF 42%  . Cardiac catheterization - Dr. Rex Kras  05/23/2002  &  2006    minimal irregularities in the LAD/  EF 50%    Interval History: [name] [name] presents today really pretty much stable with no major cardiac issues. He is currently dealing with a chest cold and sinus congestion which is is breathing well but worse. His most recent his PCP again this week. Congestion is made his baseline exertional dyspnea worse. Overall his exertional dyspnea has improved but he still gets short of breath he was up until really fast or goes up several steps. He otherwise is both a stable from cardiac standpoint. No dyspnea at rest Denies any anginal chest pain with rest or exertion. No PND, orthopnea or  edema. No palpitations, lightheadedness, dizziness, weakness or syncope/near syncope.   ROS: A comprehensive Review of Systems - was performed Review of Systems  Constitutional: Negative for fever and chills.  HENT: Positive for congestion and sore throat.   Eyes: Positive for double vision.  Respiratory: Positive for cough and shortness of breath. Negative for hemoptysis, sputum production and wheezing.        A bit hard to breath with congestion & cold symptoms.  Cardiovascular: Negative.  Negative for claudication.       Per HPI  Gastrointestinal: Negative for blood in stool and melena.  Musculoskeletal: Positive for back pain and joint pain.       He has pain in the back of his neck where he has a pinched nerve. He is undergoing physical therapy.  Neurological: Negative.  Negative for dizziness, sensory change, speech change, focal weakness, seizures and loss of consciousness.  Endo/Heme/Allergies: Negative.   Psychiatric/Behavioral: Negative for depression. The patient is not nervous/anxious.   All other systems reviewed and are negative.   Current Outpatient Prescriptions on File Prior to Visit  Medication Sig Dispense Refill  . allopurinol (ZYLOPRIM) 100 MG tablet Take 100 mg by mouth as needed.       . Cyanocobalamin (B-12 SL) Place 1 tablet under the tongue daily.      Marland Kitchen  fish oil-omega-3 fatty acids 1000 MG capsule Take 1 g by mouth daily.      Marland Kitchen gabapentin (NEURONTIN) 300 MG capsule Take 1-2 capsules (300-600 mg total) by mouth 2 (two) times daily as needed.  120 capsule  3  . Garlic TABS Take 1 tablet by mouth daily.      . Misc Natural Products (SAW PALMETTO) CAPS Take 1 capsule by mouth daily.      . Multiple Vitamins-Minerals (MULTIVITAMIN PO) Take 1 tablet by mouth daily.      . naproxen sodium (ANAPROX) 550 MG tablet Take 550 mg by mouth as needed.      . Pomegranate, Punica granatum, (POMEGRANATE PO) Take 1 capsule by mouth daily.      . Red Yeast Rice Extract (RED  YEAST RICE PO) Take 1 capsule by mouth daily.      . sildenafil (VIAGRA) 100 MG tablet 1/2 to 1 tablet up to each day as needed.  10 tablet  5  . zoster vaccine live, PF, (ZOSTAVAX) 41324 UNT/0.65ML injection Inject 19,400 Units into the skin once.  1 each  0   No current facility-administered medications on file prior to visit.    ALLERGIES REVIEWED IN EPIC -- No change SOCIAL AND FAMILY HISTORY REVIEWED IN EPIC -- No change  Wt Readings from Last 3 Encounters:  12/26/13 201 lb (91.173 kg)  12/26/13 201 lb 3.2 oz (91.264 kg)  12/26/13 201 lb (91.173 kg)    PHYSICAL EXAM BP 124/68  Pulse 80  Ht 5\' 7"  (1.702 m)  Wt 201 lb (91.173 kg)  BMI 31.47 kg/m2 General appearance: alert, cooperative, appears stated age, no distress and Pleasant mood and affect  Neck: no adenopathy, no carotid bruit, no JVD, supple, symmetrical, trachea midline and thyroid not enlarged, symmetric, no tenderness/mass/nodules  Lungs: clear to auscultation bilaterally, normal percussion bilaterally and Nonlabored  Heart: regular rate and rhythm, S1, S2 normal, no murmur, click, rub or gallop and normal apical impulse  Abdomen: soft, non-tender; bowel sounds normal; no masses, no organomegaly  Extremities: extremities normal, atraumatic, no cyanosis or edema  Pulses: 2+ and symmetric   Adult ECG Report  Rate: 80 ;  Rhythm: normal sinus rhythm  Narrative Interpretation: Mild LVH criteria  Recent Labs:  Reviewed  Lipid Panel     Component Value Date/Time   CHOL 204* 11/04/2013 1605   TRIG 290* 11/04/2013 1605   HDL 34* 11/04/2013 1605   CHOLHDL 6.0 11/04/2013 1605   VLDL 58* 11/04/2013 1605   LDLCALC 112* 11/04/2013 1605    ASSESSMENT / PLAN: Overall relatively stable from cardiac standpoint. By his choice not on cardiac medications. Everything being stable as he is, I do not feel pressed to initiate standard therapies.  Exertional shortness of breath Overall improved. He did very well on his cardiopulmonary  exercise test last year. Currently dealing with a cold but otherwise seems to be back to his baseline but not dealing with an illness.  Nonischemic cardiomyopathy No active heart failure symptoms with no PND orthopnea or edema. He has a stable blood pressure and has no need for diuretic. He is on minimal medications by his desire. In the past has been reluctant to take much in the way of medications. No active symptoms and normal blood pressure for now I think we are okay not having him on a beta blocker or ACE inhibitor/ARB.  Hypertriglyceridemia He was intolerant of statins in the past. His lipid panel is actually not that bad dissection  the triglycerides. We talked about importance of cutting back on his starchy and sugary foods.  Obesity (BMI 30-39.9) Discussed importance of avoiding T. adverse effects of obesity. This probably has some edematous hypertriglyceridemia. Hopefully by adjusting his diet and picking up will her exercise he should lose some weight which would in turn help his triglycerides.    Orders Placed This Encounter  Procedures  . EKG 12-Lead   No orders of the defined types were placed in this encounter.    Followup: 1 yr.   Leonie Man, M.D., M.S. Interventional Cardiologist   Pager # [phone]

## 2013-12-29 NOTE — Assessment & Plan Note (Signed)
Discussed importance of avoiding T. adverse effects of obesity. This probably has some edematous hypertriglyceridemia. Hopefully by adjusting his diet and picking up will her exercise he should lose some weight which would in turn help his triglycerides.

## 2013-12-29 NOTE — Assessment & Plan Note (Signed)
Overall improved. He did very well on his cardiopulmonary exercise test last year. Currently dealing with a cold but otherwise seems to be back to his baseline but not dealing with an illness.

## 2013-12-29 NOTE — Assessment & Plan Note (Addendum)
No active heart failure symptoms with no PND orthopnea or edema. He has a stable blood pressure and has no need for diuretic. He is on minimal medications by his desire. In the past has been reluctant to take much in the way of medications. No active symptoms and normal blood pressure for now I think we are okay not having him on a beta blocker or ACE inhibitor/ARB.

## 2014-01-06 DIAGNOSIS — M47812 Spondylosis without myelopathy or radiculopathy, cervical region: Secondary | ICD-10-CM | POA: Diagnosis not present

## 2014-01-09 DIAGNOSIS — M47812 Spondylosis without myelopathy or radiculopathy, cervical region: Secondary | ICD-10-CM | POA: Diagnosis not present

## 2014-01-14 DIAGNOSIS — D225 Melanocytic nevi of trunk: Secondary | ICD-10-CM | POA: Diagnosis not present

## 2014-01-14 DIAGNOSIS — Z85820 Personal history of malignant melanoma of skin: Secondary | ICD-10-CM | POA: Diagnosis not present

## 2014-01-14 DIAGNOSIS — Z08 Encounter for follow-up examination after completed treatment for malignant neoplasm: Secondary | ICD-10-CM | POA: Diagnosis not present

## 2014-01-14 DIAGNOSIS — L570 Actinic keratosis: Secondary | ICD-10-CM | POA: Diagnosis not present

## 2014-03-24 ENCOUNTER — Encounter: Payer: Medicare Other | Admitting: Family Medicine

## 2014-03-28 HISTORY — PX: ROTATOR CUFF REPAIR: SHX139

## 2014-04-14 ENCOUNTER — Ambulatory Visit (INDEPENDENT_AMBULATORY_CARE_PROVIDER_SITE_OTHER): Payer: Medicare Other | Admitting: Family Medicine

## 2014-04-14 ENCOUNTER — Encounter: Payer: Self-pay | Admitting: Family Medicine

## 2014-04-14 VITALS — BP 114/76 | HR 82 | Temp 98.5°F | Resp 16 | Ht 68.0 in | Wt 203.6 lb

## 2014-04-14 DIAGNOSIS — M542 Cervicalgia: Secondary | ICD-10-CM | POA: Diagnosis not present

## 2014-04-14 DIAGNOSIS — R972 Elevated prostate specific antigen [PSA]: Secondary | ICD-10-CM | POA: Diagnosis not present

## 2014-04-14 DIAGNOSIS — G629 Polyneuropathy, unspecified: Secondary | ICD-10-CM

## 2014-04-14 DIAGNOSIS — Z0000 Encounter for general adult medical examination without abnormal findings: Secondary | ICD-10-CM | POA: Diagnosis not present

## 2014-04-14 DIAGNOSIS — M7551 Bursitis of right shoulder: Secondary | ICD-10-CM | POA: Diagnosis not present

## 2014-04-14 DIAGNOSIS — M25511 Pain in right shoulder: Secondary | ICD-10-CM | POA: Diagnosis not present

## 2014-04-14 DIAGNOSIS — M5481 Occipital neuralgia: Secondary | ICD-10-CM

## 2014-04-14 DIAGNOSIS — M7581 Other shoulder lesions, right shoulder: Secondary | ICD-10-CM | POA: Diagnosis not present

## 2014-04-14 LAB — CBC
HCT: 43.6 % (ref 39.0–52.0)
Hemoglobin: 14.7 g/dL (ref 13.0–17.0)
MCH: 29.6 pg (ref 26.0–34.0)
MCHC: 33.7 g/dL (ref 30.0–36.0)
MCV: 87.9 fL (ref 78.0–100.0)
MPV: 11.3 fL (ref 8.6–12.4)
Platelets: 184 10*3/uL (ref 150–400)
RBC: 4.96 MIL/uL (ref 4.22–5.81)
RDW: 14 % (ref 11.5–15.5)
WBC: 8.1 10*3/uL (ref 4.0–10.5)

## 2014-04-14 LAB — COMPREHENSIVE METABOLIC PANEL
ALT: 24 U/L (ref 0–53)
AST: 19 U/L (ref 0–37)
Albumin: 4.6 g/dL (ref 3.5–5.2)
Alkaline Phosphatase: 56 U/L (ref 39–117)
BUN: 23 mg/dL (ref 6–23)
CO2: 25 mEq/L (ref 19–32)
Calcium: 9.6 mg/dL (ref 8.4–10.5)
Chloride: 104 mEq/L (ref 96–112)
Creat: 1.01 mg/dL (ref 0.50–1.35)
Glucose, Bld: 93 mg/dL (ref 70–99)
Potassium: 4.4 mEq/L (ref 3.5–5.3)
Sodium: 138 mEq/L (ref 135–145)
Total Bilirubin: 0.5 mg/dL (ref 0.2–1.2)
Total Protein: 7.5 g/dL (ref 6.0–8.3)

## 2014-04-14 LAB — LIPID PANEL
Cholesterol: 228 mg/dL — ABNORMAL HIGH (ref 0–200)
HDL: 40 mg/dL (ref >39)
LDL Cholesterol: 146 mg/dL — ABNORMAL HIGH (ref 0–99)
Total CHOL/HDL Ratio: 5.7 Ratio
Triglycerides: 212 mg/dL — ABNORMAL HIGH (ref <150)
VLDL: 42 mg/dL — ABNORMAL HIGH (ref 0–40)

## 2014-04-14 LAB — URIC ACID: Uric Acid, Serum: 7.2 mg/dL (ref 4.0–7.8)

## 2014-04-14 MED ORDER — GABAPENTIN 300 MG PO CAPS: 300.0000 mg | ORAL_CAPSULE | Freq: Two times a day (BID) | ORAL | Status: DC | PRN

## 2014-04-14 MED ORDER — HYDROCODONE-ACETAMINOPHEN 5-325 MG PO TABS: 1.0000 | ORAL_TABLET | Freq: Four times a day (QID) | ORAL | Status: DC | PRN

## 2014-04-14 NOTE — Progress Notes (Addendum)
Subjective:  This chart was scribed for [name], MD by Dellis Filbert, ED Scribe at [facility].The patient was seen in exam room 25 and the patient's care was started at 3:26 PM.   Patient ID: [name] [name], male    DOB: 07-24-1945, 69 y.o.   MRN: [id number] Chief Complaint  Patient presents with  . Annual Exam    with REFILL-gabapentin (PEND), and also pain in the RIGHT shoulder x 2 months, per patient   HPI HPI Comments: [name] [name] is a 69 y.o. male who presents to [facility] for a medicare annual wellness exam. Pt also needs a refill on gabapentin and complains of right shoulder pain ongoing for 2 months. Pt is fasting today.  Trouble falling asleep: Pt complains of trouble falling asleep, he takes two gabapentin and two melatonin and occasional tylenol PM to help him fall asleep. He denies depression or anxiety.   Light headedness: Pt states he has intermittent light headedness, none today.  Cancer screening: Last colonoscopy was in February 2014. Repeat in 3 years due to polyps that were positive for tubular adenoma x3 and one hyperplastic polyp. Prostate cancer followed by Dr. Risa Grill PSA levels were elevated, he had 3 biopsies that were negative, last seen in September of last year. Pt now sees Dr. Risa Grill annually. He denies increased urinary frequency, blood in stool and melena.   Immunizations: Has not had Prevnar and will receive this today. Pt accepted Prevnar 13 today. Immunization History  Administered Date(s) Administered  . Hepatitis B 01/14/2011  . Influenza Split 01/14/2011, 01/18/2012  . Influenza,inj,Quad PF,36+ Mos 02/04/2013, 12/26/2013  . Pneumococcal Polysaccharide-23 03/28/2004, 04/29/2009  . Tdap 08/11/2009  . Zoster 06/11/2013   Depression and Fall screening: Both negative by screening tool in [facility] today. Functional stratus screening performed. Positive for hearing difficulty see below.  Vision/Othpo: Eye itching ongoing for some time,  has not seen an eye doctor in the past year. At night he has trouble sleeping.  Hearing: Discussed hearing and declined referral to and audiologist.   Exercise: Pt walks 1 mile a day.  Dentist: UTD on his dentist  Peripheral neuropathy with episodic burning pain in left lower extremity, takes Neurontin 300 mg QHS as needed.  Cardiovascular: Hx of non-ischemic cardiomyopathy, last ECHO was June 2011 with EF 40-45%, followed by Dr. Ellyn Hack. Most recent visit was September 2014, apparently did well on cardio pulmonary exercise test one year prior. He was continued on same medication at that visit. No active visit and deferred beta blocker and ace inhibitor. He is on multiple herbal medication as noted at medication list.  Does have hyperlipidemia with LDL of 112 in August 2015. Intolerant of statins in the past, recommended diet for adjusting triglycerides. Last EKG with Dr. Ellyn Hack in October 2015.   Gout: takes Allopurinol 100 mg qd. No recent uric acid levels. He is currently not taking allopurinol. Has pain and swelling of his joints at time.  Shoulder pain: Right shoulder pain for 3 months. He was seen for right sided neck and shoulder pain last year. 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 Referred him to Dr. Nelva Bush for possible injections for neuronal impingement and occiptital neuralgia. No injections were done but he did have massages which did not improve his symptoms. Unsure of trauma, but did have pain after pulling a deer 3 months ago. The pain has not improved and gradually worsened. He has  pain with lifting up his right arm.  Advanced directives: Does have a living well and is full code. No problem making decisions and concentrating. Pt can shower and use the bathroom on his own. He is able to go out and do activities on his own.   Patient Active Problem List   Diagnosis Date Noted  . Obesity (BMI 30-39.9) 12/29/2013  . Osteoarthritis of  cervical spine 12/19/2013  . Exertional shortness of breath 06/26/2012  . Polyp of colon, adenomatous 04/27/2012  . Elevated PSA 10/20/2011  . Hypertriglyceridemia 07/08/2011  . Nonischemic cardiomyopathy 09/04/2009   Past Medical History  Diagnosis Date  . Arthritis   . Nonischemic cardiomyopathy     09-04-2009  --  2D echo-EF 40-45%, Global Hypokinesis  . Hyperlipemia   . Hypertriglyceridemia   . Coronary atherosclerosis of native coronary vessel     CATH 2004--  MINIMAL NONOBSTRUCTIVE LAD/  NORMAL EF/    CARDIAC CT  03/2008  NO SIGNIFICANT DISEASE  AND  nlef  . History of melanoma excision     2011-- LEFT UPPER BACK  . History of gout     pt 05-20-2013 states stable   . Elevated PSA   . BPH (benign prostatic hypertrophy)   . Personal history of arthritis   . Personal history of other diseases of circulatory system    Past Surgical History  Procedure Laterality Date  . Transthoracic echocardiogram  09-04-2009    MILD LVH/  MILD GLOBAL HYPOKINESIS/ LVEF 40-45%/  MILD AV SCLEROSIS WITHOUT STENOSIS/  MILD PVR  . Cardiovascular stress test  10-05-1998    MILD GLOBAL HYPOKINESIS AND ISCHEMIAIN ANTEROSEPTAL  AT [facility]/ EF 42%  . Lumbar disc surgery  09-12-2000    left  L5 -- S1  . Excision melanoma left upper back  10-14-2009  . Transrectal ultrasound prostate bx  05-23-2005  &  04-19-2001  . Cardiac catheterization  05/23/2002  &  05-23-1998   DR AL LITTLE    minimal irregularities in the LAD/  EF 50%  . Appendectomy  AS CHILD  . Prostate biopsy N/A 05/22/2013    Procedure: BIOPSY TRANSRECTAL ULTRASONIC PROSTATE (TUBP);  Surgeon: [name], MD;  Location: [facility];  Service: Urology;  Laterality: N/A;   Allergies  Allergen Reactions  . Nitroglycerin Anaphylaxis    "Cardiologist suggested he shouldn't take this med because it could kill him with his heart condition. Lowers BP"  . Ace Inhibitors Swelling  . Lovastatin Nausea Only   Prior to Admission  medications   Medication Sig Start Date End Date Taking? Authorizing Provider  allopurinol (ZYLOPRIM) 100 MG tablet Take 100 mg by mouth as needed.    Yes Historical Provider, MD  Cyanocobalamin (B-12 SL) Place 1 tablet under the tongue daily.   Yes Historical Provider, MD  fish oil-omega-3 fatty acids 1000 MG capsule Take 1 g by mouth daily.   Yes Historical Provider, MD  gabapentin (NEURONTIN) 300 MG capsule Take 1-2 capsules (300-600 mg total) by mouth 2 (two) times daily as needed. 11/04/13  Yes [name], MD  Garlic TABS Take 1 tablet by mouth daily.   Yes Historical Provider, MD  Misc Natural Products (SAW PALMETTO) CAPS Take 1 capsule by mouth daily.   Yes Historical Provider, MD  Multiple Vitamins-Minerals (MULTIVITAMIN PO) Take 1 tablet by mouth daily.   Yes Historical Provider, MD  Pomegranate, Punica granatum, (POMEGRANATE PO) Take 1 capsule by mouth daily.   Yes Historical Provider, MD  Red Yeast Rice Extract (RED YEAST RICE PO) Take 1 capsule by mouth daily.   Yes Historical Provider, MD  sildenafil (VIAGRA) 100 MG tablet 1/2 to 1 tablet up to each day as needed. 07/08/11  Yes [name], MD  zoster vaccine live, PF, (ZOSTAVAX) 16384 UNT/0.65ML injection Inject 19,400 Units into the skin once. 06/11/13   [name], MD   History   Social History  . Marital Status: Widowed    Spouse Name: N/A    Number of Children: N/A  . Years of Education: N/A   Occupational History  . 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   Social History Main Topics  . Smoking status: Former Smoker -- 2.00 packs/day for 20 years    Types: Cigarettes    Quit date: 03/28/1980  . Smokeless tobacco: Never Used  . Alcohol Use: No  . Drug Use: No  . Sexual Activity: Not on file   Other Topics Concern  . Not on file   Social History Narrative   He walks on a relatively regular basis about 15 minutes at a time mostly to the discomfort. He previously  had walked up a 30-40 minutes a time without problems.   Review of Systems 13 point ROS per pt health survey reviewed. Negative other than above or reviewed nursing note    Objective:  BP 114/76 mmHg  Pulse 82  Temp(Src) 98.5 F (36.9 C) (Oral)  Resp 16  Ht 5\' 8"  (1.727 m)  Wt 203 lb 9.6 oz (92.352 kg)  BMI 30.96 kg/m2  SpO2 96%  Physical Exam  Constitutional: He is oriented to person, place, and time. He appears well-developed and well-nourished.  HENT:  Head: Normocephalic and atraumatic.  Right Ear: External ear normal.  Left Ear: External ear normal.  Mouth/Throat: Oropharynx is clear and moist.  Eyes: Conjunctivae and EOM are normal. Pupils are equal, round, and reactive to light.  Neck: Normal range of motion. Neck supple. No thyromegaly present.  Cardiovascular: Normal rate, regular rhythm, normal heart sounds and intact distal pulses.   Pulmonary/Chest: Effort normal and breath sounds normal. No respiratory distress. He has no wheezes.  Abdominal: Soft. He exhibits no distension. There is no tenderness.  Musculoskeletal: Normal range of motion. He exhibits no edema or tenderness.  Cervical Spine: Cervical spine has decreased ROM with left and right rotation as well as flexion and extension. Did not reproduce pain in neck. Perispinal pain at cervical spine  Right Shoulder: [place] and AC joints non tender. Equal ROM just pain at extremes. Pain with equal strength with RTC testing. Positive Neers with elevation approximately 120 degrees and positive [place].   Lymphadenopathy:    He has no cervical adenopathy.  Neurological: He is alert and oriented to person, place, and time. He has normal reflexes.  Skin: Skin is warm and dry.  Psychiatric: He has a normal mood and affect. His behavior is normal.  Nursing note and vitals reviewed.   Visual Acuity Screening   Right eye Left eye Both eyes  Without correction:     With correction: 20/25 20/25 20/25        Assessment & Plan:    [name] [name] is a 69 y.o. male Encounter for Medicare annual wellness exam - Plan: Lipid panel, CBC, Uric acid, Comprehensive metabolic panel  - -anticipatory guidance as below in AVS, screening labs and other health maintenance items as above in HPI discussed/recommended as applicable. Declined eval for  hearing aids at this time.   Peripheral neuropathy - Plan: gabapentin (NEURONTIN) 300 MG capsule, Lipid panel, CBC, Uric acid, Comprehensive metabolic panel  - cont neurontin at same doses as LE sx's stable.   Neck pain on right side, Occipital neuralgia, unspecified laterality - Plan: gabapentin (NEURONTIN) 300 MG capsule, Lipid panel, CBC, Uric acid, Comprehensive metabolic panel  - now improved. Consider repeat eval with Dr. Nelva Bush if persists.   Elevated PSA - Plan: Lipid panel, CBC, Uric acid, Comprehensive metabolic panel  -by hx - continue follow up with urology.   Pain in joint, shoulder region, right , Rotator cuff tendinitis, right , shoulder bursitis, right - Plan: HYDROcodone-acetaminophen (NORCO/VICODIN) 5-325 MG per tablet, Lipid panel, CBC, Uric acid, Comprehensive metabolic panel  -RTC tendonitis (infraspinatus/teres minor on RTC testing) vs. Just subacromial bursitis.   -as no treatments at this point, can try otc nsaid short course IF this is ok'd by his cardiologist with prior hx of nonischemic cardiomyopathy.   -consider injection after XR if not improving with intermittent nsaid and HEP/treatment in handout. recheck in next 4-6 weeks.   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 Handout below. lortab if needed for increased pain in shoulder and discuss further next ov. Sooner if worse.   Meds ordered this encounter  Medications  . gabapentin (NEURONTIN) 300 MG capsule    Sig: Take 1-2 capsules (300-600 mg total) by mouth 2 (two) times daily as needed.    Dispense:  120 capsule    Refill:  3  . HYDROcodone-acetaminophen (NORCO/VICODIN) 5-325 MG per tablet    Sig: Take 1  tablet by mouth every 6 (six) hours as needed for moderate pain.    Dispense:  20 tablet    Refill:  0   Patient Instructions  For your sleep difficulty - follow up to discuss this further.  For now - continue melatonin and gabapentin, see other information below and recheck in next 4-6 weeks to discuss further.  Your shoulder pain may be due to bursitis or tendonitis of rotator cuff.  If ok with your heart doctor (call him first), you can try occasional advil or alleve, but recheck in next month if not resolving as may need xray and injection to help this.  For more severe pain times, can take hydrocodone, but be careful with dizziness and sedation on this medicine.  Return to the clinic or go to the nearest emergency room if any of your symptoms worsen or new symptoms occur.   Impingement Syndrome, Rotator Cuff, Bursitis with Rehab Impingement syndrome is a condition that involves inflammation of the tendons of the rotator cuff and the subacromial bursa, that causes pain in the shoulder. The rotator cuff consists of four tendons and muscles that control much of the shoulder and upper arm function. The subacromial bursa is a fluid filled sac that helps reduce friction between the rotator cuff and one of the bones of the shoulder (acromion). Impingement syndrome is usually an overuse injury that causes swelling of the bursa (bursitis), swelling of the tendon (tendonitis), and/or a tear of the tendon (strain). Strains are classified into three categories. Grade 1 strains cause pain, but the tendon is not lengthened. Grade 2 strains include a lengthened ligament, due to the ligament being stretched or partially ruptured. With grade 2 strains there is still function, although the function may be decreased. Grade 3 strains include a complete tear of the tendon or muscle, and function is usually impaired. SYMPTOMS   Pain around the shoulder,  often at the outer portion of the upper arm.  Pain that gets  worse with shoulder function, especially when reaching overhead or lifting.  Sometimes, aching when not using the arm.  Pain that wakes you up at night.  Sometimes, tenderness, swelling, warmth, or redness over the affected area.  Loss of strength.  Limited motion of the shoulder, especially reaching behind the back (to the back pocket or to unhook bra) or across your body.  Crackling sound (crepitation) when moving the arm.  Biceps tendon pain and inflammation (in the front of the shoulder). Worse when bending the elbow or lifting. CAUSES  Impingement syndrome is often an overuse injury, in which chronic (repetitive) motions cause the tendons or bursa to become inflamed. A strain occurs when a force is paced on the tendon or muscle that is greater than it can withstand. Common mechanisms of injury include: Stress from sudden increase in duration, frequency, or intensity of training.  Direct hit (trauma) to the shoulder.  Aging, erosion of the tendon with normal use.  Bony bump on shoulder (acromial spur). RISK INCREASES WITH:  Contact sports (football, wrestling, boxing).  Throwing sports (baseball, tennis, volleyball).  Weightlifting and bodybuilding.  Heavy labor.  Previous injury to the rotator cuff, including impingement.  Poor shoulder strength and flexibility.  Failure to warm up properly before activity.  Inadequate protective equipment.  Old age.  Bony bump on shoulder (acromial spur). PREVENTION   Warm up and stretch properly before activity.  Allow for adequate recovery between workouts.  Maintain physical fitness:  Strength, flexibility, and endurance.  Cardiovascular fitness.  Learn and use proper exercise technique. PROGNOSIS  If treated properly, impingement syndrome usually goes away within 6 weeks. Sometimes surgery is required.  RELATED COMPLICATIONS   Longer healing time if not properly treated, or if not given enough time to  heal.  Recurring symptoms, that result in a chronic condition.  Shoulder stiffness, frozen shoulder, or loss of motion.  Rotator cuff tendon tear.  Recurring symptoms, especially if activity is resumed too soon, with overuse, with a direct blow, or when using poor technique. TREATMENT  Treatment first involves the use of ice and medicine, to reduce pain and inflammation. The use of strengthening and stretching exercises may help reduce pain with activity. These exercises may be performed at home or with a therapist. If non-surgical treatment is unsuccessful after more than 6 months, surgery may be advised. After surgery and rehabilitation, activity is usually possible in 3 months.  MEDICATION  If pain medicine is needed, nonsteroidal anti-inflammatory medicines (aspirin and ibuprofen), or other minor pain relievers (acetaminophen), are often advised.  Do not take pain medicine for 7 days before surgery.  Prescription pain relievers may be given, if your caregiver thinks they are needed. Use only as directed and only as much as you need.  Corticosteroid injections may be given by your caregiver. These injections should be reserved for the most serious cases, because they may only be given a certain number of times. HEAT AND COLD  Cold treatment (icing) should be applied for 10 to 15 minutes every 2 to 3 hours for inflammation and pain, and immediately after activity that aggravates your symptoms. Use ice packs or an ice massage.  Heat treatment may be used before performing stretching and strengthening activities prescribed by your caregiver, physical therapist, or athletic trainer. Use a heat pack or a warm water soak. SEEK MEDICAL CARE IF:   Symptoms get worse or do not improve in  4 to 6 weeks, despite treatment.  New, unexplained symptoms develop. (Drugs used in treatment may produce side effects.) EXERCISES  RANGE OF MOTION (ROM) AND STRETCHING EXERCISES - Impingement Syndrome  (Rotator Cuff  Tendinitis, Bursitis) These exercises may help you when beginning to rehabilitate your injury. Your symptoms may go away with or without further involvement from your physician, physical therapist or athletic trainer. While completing these exercises, remember:   Restoring tissue flexibility helps normal motion to return to the joints. This allows healthier, less painful movement and activity.  An effective stretch should be held for at least 30 seconds.  A stretch should never be painful. You should only feel a gentle lengthening or release in the stretched tissue. STRETCH - Flexion, Standing  Stand with good posture. With an underhand grip on your right / left hand, and an overhand grip on the opposite hand, grasp a broomstick or cane so that your hands are a little more than shoulder width apart.  Keeping your right / left elbow straight and shoulder muscles relaxed, push the stick with your opposite hand, to raise your right / left arm in front of your body and then overhead. Raise your arm until you feel a stretch in your right / left shoulder, but before you have increased shoulder pain.  Try to avoid shrugging your right / left shoulder as your arm rises, by keeping your shoulder blade tucked down and toward your mid-back spine. Hold for __________ seconds.  Slowly return to the starting position. Repeat __________ times. Complete this exercise __________ times per day. STRETCH - Abduction, Supine  Lie on your back. With an underhand grip on your right / left hand and an overhand grip on the opposite hand, grasp a broomstick or cane so that your hands are a little more than shoulder width apart.  Keeping your right / left elbow straight and your shoulder muscles relaxed, push the stick with your opposite hand, to raise your right / left arm out to the side of your body and then overhead. Raise your arm until you feel a stretch in your right / left shoulder, but before you  have increased shoulder pain.  Try to avoid shrugging your right / left shoulder as your arm rises, by keeping your shoulder blade tucked down and toward your mid-back spine. Hold for __________ seconds.  Slowly return to the starting position. Repeat __________ times. Complete this exercise __________ times per day. ROM - Flexion, Active-Assisted  Lie on your back. You may bend your knees for comfort.  Grasp a broomstick or cane so your hands are about shoulder width apart. Your right / left hand should grip the end of the stick, so that your hand is positioned "thumbs-up," as if you were about to shake hands.  Using your healthy arm to lead, raise your right / left arm overhead, until you feel a gentle stretch in your shoulder. Hold for __________ seconds.  Use the stick to assist in returning your right / left arm to its starting position. Repeat __________ times. Complete this exercise __________ times per day.  ROM - Internal Rotation, Supine   Lie on your back on a firm surface. Place your right / left elbow about 60 degrees away from your side. Elevate your elbow with a folded towel, so that the elbow and shoulder are the same height.  Using a broomstick or cane and your strong arm, pull your right / left hand toward your body until you feel a  gentle stretch, but no increase in your shoulder pain. Keep your shoulder and elbow in place throughout the exercise.  Hold for __________ seconds. Slowly return to the starting position. Repeat __________ times. Complete this exercise __________ times per day. STRETCH - Internal Rotation  Place your right / left hand behind your back, palm up.  Throw a towel or belt over your opposite shoulder. Grasp the towel with your right / left hand.  While keeping an upright posture, gently pull up on the towel, until you feel a stretch in the front of your right / left shoulder.  Avoid shrugging your right / left shoulder as your arm rises, by  keeping your shoulder blade tucked down and toward your mid-back spine.  Hold for __________ seconds. Release the stretch, by lowering your healthy hand. Repeat __________ times. Complete this exercise __________ times per day. ROM - Internal Rotation   Using an underhand grip, grasp a stick behind your back with both hands.  While standing upright with good posture, slide the stick up your back until you feel a mild stretch in the front of your shoulder.  Hold for __________ seconds. Slowly return to your starting position. Repeat __________ times. Complete this exercise __________ times per day.  STRETCH - Posterior Shoulder Capsule   Stand or sit with good posture. Grasp your right / left elbow and draw it across your chest, keeping it at the same height as your shoulder.  Pull your elbow, so your upper arm comes in closer to your chest. Pull until you feel a gentle stretch in the back of your shoulder.  Hold for __________ seconds. Repeat __________ times. Complete this exercise __________ times per day. STRENGTHENING EXERCISES - Impingement Syndrome (Rotator Cuff Tendinitis, Bursitis) These exercises may help you when beginning to rehabilitate your injury. They may resolve your symptoms with or without further involvement from your physician, physical therapist or athletic trainer. While completing these exercises, remember:  Muscles can gain both the endurance and the strength needed for everyday activities through controlled exercises.  Complete these exercises as instructed by your physician, physical therapist or athletic trainer. Increase the resistance and repetitions only as guided.  You may experience muscle soreness or fatigue, but the pain or discomfort you are trying to eliminate should never worsen during these exercises. If this pain does get worse, stop and make sure you are following the directions exactly. If the pain is still present after adjustments, discontinue the  exercise until you can discuss the trouble with your clinician.  During your recovery, avoid activity or exercises which involve actions that place your injured hand or elbow above your head or behind your back or head. These positions stress the tissues which you are trying to heal. STRENGTH - Scapular Depression and Adduction   With good posture, sit on a firm chair. Support your arms in front of you, with pillows, arm rests, or on a table top. Have your elbows in line with the sides of your body.  Gently draw your shoulder blades down and toward your mid-back spine. Gradually increase the tension, without tensing the muscles along the top of your shoulders and the back of your neck.  Hold for __________ seconds. Slowly release the tension and relax your muscles completely before starting the next repetition.  After you have practiced this exercise, remove the arm support and complete the exercise in standing as well as sitting position. Repeat __________ times. Complete this exercise __________ times per day.  STRENGTH -  Shoulder Abductors, Isometric  With good posture, stand or sit about 4-6 inches from a wall, with your right / left side facing the wall.  Bend your right / left elbow. Gently press your right / left elbow into the wall. Increase the pressure gradually, until you are pressing as hard as you can, without shrugging your shoulder or increasing any shoulder discomfort.  Hold for __________ seconds.  Release the tension slowly. Relax your shoulder muscles completely before you begin the next repetition. Repeat __________ times. Complete this exercise __________ times per day.  STRENGTH - External Rotators, Isometric  Keep your right / left elbow at your side and bend it 90 degrees.  Step into a door frame so that the outside of your right / left wrist can press against the door frame without your upper arm leaving your side.  Gently press your right / left wrist into the  door frame, as if you were trying to swing the back of your hand away from your stomach. Gradually increase the tension, until you are pressing as hard as you can, without shrugging your shoulder or increasing any shoulder discomfort.  Hold for __________ seconds.  Release the tension slowly. Relax your shoulder muscles completely before you begin the next repetition. Repeat __________ times. Complete this exercise __________ times per day.  STRENGTH - Supraspinatus   Stand or sit with good posture. Grasp a __________ weight, or an exercise band or tubing, so that your hand is "thumbs-up," like you are shaking hands.  Slowly lift your right / left arm in a "V" away from your thigh, diagonally into the space between your side and straight ahead. Lift your hand to shoulder height or as far as you can, without increasing any shoulder pain. At first, many people do not lift their hands above shoulder height.  Avoid shrugging your right / left shoulder as your arm rises, by keeping your shoulder blade tucked down and toward your mid-back spine.  Hold for __________ seconds. Control the descent of your hand, as you slowly return to your starting position. Repeat __________ times. Complete this exercise __________ times per day.  STRENGTH - External Rotators  Secure a rubber exercise band or tubing to a fixed object (table, pole) so that it is at the same height as your right / left elbow when you are standing or sitting on a firm surface.  Stand or sit so that the secured exercise band is at your uninjured side.  Bend your right / left elbow 90 degrees. Place a folded towel or small pillow under your right / left arm, so that your elbow is a few inches away from your side.  Keeping the tension on the exercise band, pull it away from your body, as if pivoting on your elbow. Be sure to keep your body steady, so that the movement is coming only from your rotating shoulder.  Hold for __________  seconds. Release the tension in a controlled manner, as you return to the starting position. Repeat __________ times. Complete this exercise __________ times per day.  STRENGTH - Internal Rotators   Secure a rubber exercise band or tubing to a fixed object (table, pole) so that it is at the same height as your right / left elbow when you are standing or sitting on a firm surface.  Stand or sit so that the secured exercise band is at your right / left side.  Bend your elbow 90 degrees. Place a folded towel or small  pillow under your right / left arm so that your elbow is a few inches away from your side.  Keeping the tension on the exercise band, pull it across your body, toward your stomach. Be sure to keep your body steady, so that the movement is coming only from your rotating shoulder.  Hold for __________ seconds. Release the tension in a controlled manner, as you return to the starting position. Repeat __________ times. Complete this exercise __________ times per day.  STRENGTH - Scapular Protractors, Standing   Stand arms length away from a wall. Place your hands on the wall, keeping your elbows straight.  Begin by dropping your shoulder blades down and toward your mid-back spine.  To strengthen your protractors, keep your shoulder blades down, but slide them forward on your rib cage. It will feel as if you are lifting the back of your rib cage away from the wall. This is a subtle motion and can be challenging to complete. Ask your caregiver for further instruction, if you are not sure you are doing the exercise correctly.  Hold for __________ seconds. Slowly return to the starting position, resting the muscles completely before starting the next repetition. Repeat __________ times. Complete this exercise __________ times per day. STRENGTH - Scapular Protractors, Supine  Lie on your back on a firm surface. Extend your right / left arm straight into the air while holding a __________  weight in your hand.  Keeping your head and back in place, lift your shoulder off the floor.  Hold for __________ seconds. Slowly return to the starting position, and allow your muscles to relax completely before starting the next repetition. Repeat __________ times. Complete this exercise __________ times per day. STRENGTH - Scapular Protractors, Quadruped  Get onto your hands and knees, with your shoulders directly over your hands (or as close as you can be, comfortably).  Keeping your elbows locked, lift the back of your rib cage up into your shoulder blades, so your mid-back rounds out. Keep your neck muscles relaxed.  Hold this position for __________ seconds. Slowly return to the starting position and allow your muscles to relax completely before starting the next repetition. Repeat __________ times. Complete this exercise __________ times per day.  STRENGTH - Scapular Retractors  Secure a rubber exercise band or tubing to a fixed object (table, pole), so that it is at the height of your shoulders when you are either standing, or sitting on a firm armless chair.  With a palm down grip, grasp an end of the band in each hand. Straighten your elbows and lift your hands straight in front of you, at shoulder height. Step back, away from the secured end of the band, until it becomes tense.  Squeezing your shoulder blades together, draw your elbows back toward your sides, as you bend them. Keep your upper arms lifted away from your body throughout the exercise.  Hold for __________ seconds. Slowly ease the tension on the band, as you reverse the directions and return to the starting position. Repeat __________ times. Complete this exercise __________ times per day. STRENGTH - Shoulder Extensors   Secure a rubber exercise band or tubing to a fixed object (table, pole) so that it is at the height of your shoulders when you are either standing, or sitting on a firm armless chair.  With a  thumbs-up grip, grasp an end of the band in each hand. Straighten your elbows and lift your hands straight in front of you, at shoulder  height. Step back, away from the secured end of the band, until it becomes tense.  Squeezing your shoulder blades together, pull your hands down to the sides of your thighs. Do not allow your hands to go behind you.  Hold for __________ seconds. Slowly ease the tension on the band, as you reverse the directions and return to the starting position. Repeat __________ times. Complete this exercise __________ times per day.  STRENGTH - Scapular Retractors and External Rotators   Secure a rubber exercise band or tubing to a fixed object (table, pole) so that it is at the height as your shoulders, when you are either standing, or sitting on a firm armless chair.  With a palm down grip, grasp an end of the band in each hand. Bend your elbows 90 degrees and lift your elbows to shoulder height, at your sides. Step back, away from the secured end of the band, until it becomes tense.  Squeezing your shoulder blades together, rotate your shoulders so that your upper arms and elbows remain stationary, but your fists travel upward to head height.  Hold for __________ seconds. Slowly ease the tension on the band, as you reverse the directions and return to the starting position. Repeat __________ times. Complete this exercise __________ times per day.  STRENGTH - Scapular Retractors and External Rotators, Rowing   Secure a rubber exercise band or tubing to a fixed object (table, pole) so that it is at the height of your shoulders, when you are either standing, or sitting on a firm armless chair.  With a palm down grip, grasp an end of the band in each hand. Straighten your elbows and lift your hands straight in front of you, at shoulder height. Step back, away from the secured end of the band, until it becomes tense.  Step 1: Squeeze your shoulder blades together. Bending  your elbows, draw your hands to your chest, as if you are rowing a boat. At the end of this motion, your hands and elbow should be at shoulder height and your elbows should be out to your sides.  Step 2: Rotate your shoulders, to raise your hands above your head. Your forearms should be vertical and your upper arms should be horizontal.  Hold for __________ seconds. Slowly ease the tension on the band, as you reverse the directions and return to the starting position. Repeat __________ times. Complete this exercise __________ times per day.  STRENGTH - Scapular Depressors  Find a sturdy chair without wheels, such as a dining room chair.  Keeping your feet on the floor, and your hands on the chair arms, lift your bottom up from the seat, and lock your elbows.  Keeping your elbows straight, allow gravity to pull your body weight down. Your shoulders will rise toward your ears.  Raise your body against gravity by drawing your shoulder blades down your back, shortening the distance between your shoulders and ears. Although your feet should always maintain contact with the floor, your feet should progressively support less body weight, as you get stronger.  Hold for __________ seconds. In a controlled and slow manner, lower your body weight to begin the next repetition. Repeat __________ times. Complete this exercise __________ times per day.  Document Released: 03/14/2005 Document Revised: 06/06/2011 Document Reviewed: 06/26/2008 [facility] Patient Information 2015 [place], [place]. This information is not intended to replace advice given to you by your health care provider. Make sure you discuss any questions you have with your health care provider.  Insomnia Insomnia is frequent trouble falling and/or staying asleep. Insomnia can be a long term problem or a short term problem. Both are common. Insomnia can be a short term problem when the wakefulness is related to a certain stress or worry. Long  term insomnia is often related to ongoing stress during waking hours and/or poor sleeping habits. Overtime, sleep deprivation itself can make the problem worse. Every little thing feels more severe because you are overtired and your ability to cope is decreased. CAUSES   Stress, anxiety, and depression.  Poor sleeping habits.  Distractions such as TV in the bedroom.  Naps close to bedtime.  Engaging in emotionally charged conversations before bed.  Technical reading before sleep.  Alcohol and other sedatives. They may make the problem worse. They can hurt normal sleep patterns and normal dream activity.  Stimulants such as caffeine for several hours prior to bedtime.  Pain syndromes and shortness of breath can cause insomnia.  Exercise late at night.  Changing time zones may cause sleeping problems (jet lag). It is sometimes helpful to have someone observe your sleeping patterns. They should look for periods of not breathing during the night (sleep apnea). They should also look to see how long those periods last. If you live alone or observers are uncertain, you can also be observed at a sleep clinic where your sleep patterns will be professionally monitored. Sleep apnea requires a checkup and treatment. Give your caregivers your medical history. Give your caregivers observations your family has made about your sleep.  SYMPTOMS   Not feeling rested in the morning.  Anxiety and restlessness at bedtime.  Difficulty falling and staying asleep. TREATMENT   Your caregiver may prescribe treatment for an underlying medical disorders. Your caregiver can give advice or help if you are using alcohol or other drugs for self-medication. Treatment of underlying problems will usually eliminate insomnia problems.  Medications can be prescribed for short time use. They are generally not recommended for lengthy use.  Over-the-counter sleep medicines are not recommended for lengthy use. They can  be habit forming.  You can promote easier sleeping by making lifestyle changes such as:  Using relaxation techniques that help with breathing and reduce muscle tension.  Exercising earlier in the day.  Changing your diet and the time of your last meal. No night time snacks.  Establish a regular time to go to bed.  Counseling can help with stressful problems and worry.  Soothing music and white noise may be helpful if there are background noises you cannot remove.  Stop tedious detailed work at least one hour before bedtime. HOME CARE INSTRUCTIONS   Keep a diary. Inform your caregiver about your progress. This includes any medication side effects. See your caregiver regularly. Take note of:  Times when you are asleep.  Times when you are awake during the night.  The quality of your sleep.  How you feel the next day. This information will help your caregiver care for you.  Get out of bed if you are still awake after 15 minutes. Read or do some quiet activity. Keep the lights down. Wait until you feel sleepy and go back to bed.  Keep regular sleeping and waking hours. Avoid naps.  Exercise regularly.  Avoid distractions at bedtime. Distractions include watching television or engaging in any intense or detailed activity like attempting to balance the household checkbook.  Develop a bedtime ritual. Keep a familiar routine of bathing, brushing your teeth, climbing into bed at the  same time each night, listening to soothing music. Routines increase the success of falling to sleep faster.  Use relaxation techniques. This can be using breathing and muscle tension release routines. It can also include visualizing peaceful scenes. You can also help control troubling or intruding thoughts by keeping your mind occupied with boring or repetitive thoughts like the old concept of counting sheep. You can make it more creative like imagining planting one beautiful flower after another in your  backyard garden.  During your day, work to eliminate stress. When this is not possible use some of the previous suggestions to help reduce the anxiety that accompanies stressful situations. MAKE SURE YOU:   Understand these instructions.  Will watch your condition.  Will get help right away if you are not doing well or get worse. Document Released: 03/11/2000 Document Revised: 06/06/2011 Document Reviewed: 04/11/2007 [facility] Patient Information 2015 [place], [place]. This information is not intended to replace advice given to you by your health care provider. Make sure you discuss any questions you have with your health care provider.    I personally performed the services described in this documentation, which was scribed in my presence. The recorded information has been reviewed and considered, and addended by me as needed.

## 2014-04-14 NOTE — Patient Instructions (Addendum)
For your sleep difficulty - follow up to discuss this further.  For now - continue melatonin and gabapentin, see other information below and recheck in next 4-6 weeks to discuss further.  Your shoulder pain may be due to bursitis or tendonitis of rotator cuff.  If ok with your heart doctor (call him first), you can try occasional advil or alleve, but recheck in next month if not resolving as may need xray and injection to help this.  For more severe pain times, can take hydrocodone, but be careful with dizziness and sedation on this medicine.  Return to the clinic or go to the nearest emergency room if any of your symptoms worsen or new symptoms occur.   Impingement Syndrome, Rotator Cuff, Bursitis with Rehab Impingement syndrome is a condition that involves inflammation of the tendons of the rotator cuff and the subacromial bursa, that causes pain in the shoulder. The rotator cuff consists of four tendons and muscles that control much of the shoulder and upper arm function. The subacromial bursa is a fluid filled sac that helps reduce friction between the rotator cuff and one of the bones of the shoulder (acromion). Impingement syndrome is usually an overuse injury that causes swelling of the bursa (bursitis), swelling of the tendon (tendonitis), and/or a tear of the tendon (strain). Strains are classified into three categories. Grade 1 strains cause pain, but the tendon is not lengthened. Grade 2 strains include a lengthened ligament, due to the ligament being stretched or partially ruptured. With grade 2 strains there is still function, although the function may be decreased. Grade 3 strains include a complete tear of the tendon or muscle, and function is usually impaired. SYMPTOMS   Pain around the shoulder, often at the outer portion of the upper arm.  Pain that gets worse with shoulder function, especially when reaching overhead or lifting.  Sometimes, aching when not using the arm.  Pain that  wakes you up at night.  Sometimes, tenderness, swelling, warmth, or redness over the affected area.  Loss of strength.  Limited motion of the shoulder, especially reaching behind the back (to the back pocket or to unhook bra) or across your body.  Crackling sound (crepitation) when moving the arm.  Biceps tendon pain and inflammation (in the front of the shoulder). Worse when bending the elbow or lifting. CAUSES  Impingement syndrome is often an overuse injury, in which chronic (repetitive) motions cause the tendons or bursa to become inflamed. A strain occurs when a force is paced on the tendon or muscle that is greater than it can withstand. Common mechanisms of injury include: Stress from sudden increase in duration, frequency, or intensity of training.  Direct hit (trauma) to the shoulder.  Aging, erosion of the tendon with normal use.  Bony bump on shoulder (acromial spur). RISK INCREASES WITH:  Contact sports (football, wrestling, boxing).  Throwing sports (baseball, tennis, volleyball).  Weightlifting and bodybuilding.  Heavy labor.  Previous injury to the rotator cuff, including impingement.  Poor shoulder strength and flexibility.  Failure to warm up properly before activity.  Inadequate protective equipment.  Old age.  Bony bump on shoulder (acromial spur). PREVENTION   Warm up and stretch properly before activity.  Allow for adequate recovery between workouts.  Maintain physical fitness:  Strength, flexibility, and endurance.  Cardiovascular fitness.  Learn and use proper exercise technique. PROGNOSIS  If treated properly, impingement syndrome usually goes away within 6 weeks. Sometimes surgery is required.  RELATED COMPLICATIONS   Longer  healing time if not properly treated, or if not given enough time to heal.  Recurring symptoms, that result in a chronic condition.  Shoulder stiffness, frozen shoulder, or loss of motion.  Rotator cuff  tendon tear.  Recurring symptoms, especially if activity is resumed too soon, with overuse, with a direct blow, or when using poor technique. TREATMENT  Treatment first involves the use of ice and medicine, to reduce pain and inflammation. The use of strengthening and stretching exercises may help reduce pain with activity. These exercises may be performed at home or with a therapist. If non-surgical treatment is unsuccessful after more than 6 months, surgery may be advised. After surgery and rehabilitation, activity is usually possible in 3 months.  MEDICATION  If pain medicine is needed, nonsteroidal anti-inflammatory medicines (aspirin and ibuprofen), or other minor pain relievers (acetaminophen), are often advised.  Do not take pain medicine for 7 days before surgery.  Prescription pain relievers may be given, if your caregiver thinks they are needed. Use only as directed and only as much as you need.  Corticosteroid injections may be given by your caregiver. These injections should be reserved for the most serious cases, because they may only be given a certain number of times. HEAT AND COLD  Cold treatment (icing) should be applied for 10 to 15 minutes every 2 to 3 hours for inflammation and pain, and immediately after activity that aggravates your symptoms. Use ice packs or an ice massage.  Heat treatment may be used before performing stretching and strengthening activities prescribed by your caregiver, physical therapist, or athletic trainer. Use a heat pack or a warm water soak. SEEK MEDICAL CARE IF:   Symptoms get worse or do not improve in 4 to 6 weeks, despite treatment.  New, unexplained symptoms develop. (Drugs used in treatment may produce side effects.) EXERCISES  RANGE OF MOTION (ROM) AND STRETCHING EXERCISES - Impingement Syndrome (Rotator Cuff  Tendinitis, Bursitis) These exercises may help you when beginning to rehabilitate your injury. Your symptoms may go away with or  without further involvement from your physician, physical therapist or athletic trainer. While completing these exercises, remember:   Restoring tissue flexibility helps normal motion to return to the joints. This allows healthier, less painful movement and activity.  An effective stretch should be held for at least 30 seconds.  A stretch should never be painful. You should only feel a gentle lengthening or release in the stretched tissue. STRETCH - Flexion, Standing  Stand with good posture. With an underhand grip on your right / left hand, and an overhand grip on the opposite hand, grasp a broomstick or cane so that your hands are a little more than shoulder width apart.  Keeping your right / left elbow straight and shoulder muscles relaxed, push the stick with your opposite hand, to raise your right / left arm in front of your body and then overhead. Raise your arm until you feel a stretch in your right / left shoulder, but before you have increased shoulder pain.  Try to avoid shrugging your right / left shoulder as your arm rises, by keeping your shoulder blade tucked down and toward your mid-back spine. Hold for __________ seconds.  Slowly return to the starting position. Repeat __________ times. Complete this exercise __________ times per day. STRETCH - Abduction, Supine  Lie on your back. With an underhand grip on your right / left hand and an overhand grip on the opposite hand, grasp a broomstick or cane so that  your hands are a little more than shoulder width apart.  Keeping your right / left elbow straight and your shoulder muscles relaxed, push the stick with your opposite hand, to raise your right / left arm out to the side of your body and then overhead. Raise your arm until you feel a stretch in your right / left shoulder, but before you have increased shoulder pain.  Try to avoid shrugging your right / left shoulder as your arm rises, by keeping your shoulder blade tucked down  and toward your mid-back spine. Hold for __________ seconds.  Slowly return to the starting position. Repeat __________ times. Complete this exercise __________ times per day. ROM - Flexion, Active-Assisted  Lie on your back. You may bend your knees for comfort.  Grasp a broomstick or cane so your hands are about shoulder width apart. Your right / left hand should grip the end of the stick, so that your hand is positioned "thumbs-up," as if you were about to shake hands.  Using your healthy arm to lead, raise your right / left arm overhead, until you feel a gentle stretch in your shoulder. Hold for __________ seconds.  Use the stick to assist in returning your right / left arm to its starting position. Repeat __________ times. Complete this exercise __________ times per day.  ROM - Internal Rotation, Supine   Lie on your back on a firm surface. Place your right / left elbow about 60 degrees away from your side. Elevate your elbow with a folded towel, so that the elbow and shoulder are the same height.  Using a broomstick or cane and your strong arm, pull your right / left hand toward your body until you feel a gentle stretch, but no increase in your shoulder pain. Keep your shoulder and elbow in place throughout the exercise.  Hold for __________ seconds. Slowly return to the starting position. Repeat __________ times. Complete this exercise __________ times per day. STRETCH - Internal Rotation  Place your right / left hand behind your back, palm up.  Throw a towel or belt over your opposite shoulder. Grasp the towel with your right / left hand.  While keeping an upright posture, gently pull up on the towel, until you feel a stretch in the front of your right / left shoulder.  Avoid shrugging your right / left shoulder as your arm rises, by keeping your shoulder blade tucked down and toward your mid-back spine.  Hold for __________ seconds. Release the stretch, by lowering your  healthy hand. Repeat __________ times. Complete this exercise __________ times per day. ROM - Internal Rotation   Using an underhand grip, grasp a stick behind your back with both hands.  While standing upright with good posture, slide the stick up your back until you feel a mild stretch in the front of your shoulder.  Hold for __________ seconds. Slowly return to your starting position. Repeat __________ times. Complete this exercise __________ times per day.  STRETCH - Posterior Shoulder Capsule   Stand or sit with good posture. Grasp your right / left elbow and draw it across your chest, keeping it at the same height as your shoulder.  Pull your elbow, so your upper arm comes in closer to your chest. Pull until you feel a gentle stretch in the back of your shoulder.  Hold for __________ seconds. Repeat __________ times. Complete this exercise __________ times per day. STRENGTHENING EXERCISES - Impingement Syndrome (Rotator Cuff Tendinitis, Bursitis) These exercises may help  you when beginning to rehabilitate your injury. They may resolve your symptoms with or without further involvement from your physician, physical therapist or athletic trainer. While completing these exercises, remember:  Muscles can gain both the endurance and the strength needed for everyday activities through controlled exercises.  Complete these exercises as instructed by your physician, physical therapist or athletic trainer. Increase the resistance and repetitions only as guided.  You may experience muscle soreness or fatigue, but the pain or discomfort you are trying to eliminate should never worsen during these exercises. If this pain does get worse, stop and make sure you are following the directions exactly. If the pain is still present after adjustments, discontinue the exercise until you can discuss the trouble with your clinician.  During your recovery, avoid activity or exercises which involve actions  that place your injured hand or elbow above your head or behind your back or head. These positions stress the tissues which you are trying to heal. STRENGTH - Scapular Depression and Adduction   With good posture, sit on a firm chair. Support your arms in front of you, with pillows, arm rests, or on a table top. Have your elbows in line with the sides of your body.  Gently draw your shoulder blades down and toward your mid-back spine. Gradually increase the tension, without tensing the muscles along the top of your shoulders and the back of your neck.  Hold for __________ seconds. Slowly release the tension and relax your muscles completely before starting the next repetition.  After you have practiced this exercise, remove the arm support and complete the exercise in standing as well as sitting position. Repeat __________ times. Complete this exercise __________ times per day.  STRENGTH - Shoulder Abductors, Isometric  With good posture, stand or sit about 4-6 inches from a wall, with your right / left side facing the wall.  Bend your right / left elbow. Gently press your right / left elbow into the wall. Increase the pressure gradually, until you are pressing as hard as you can, without shrugging your shoulder or increasing any shoulder discomfort.  Hold for __________ seconds.  Release the tension slowly. Relax your shoulder muscles completely before you begin the next repetition. Repeat __________ times. Complete this exercise __________ times per day.  STRENGTH - External Rotators, Isometric  Keep your right / left elbow at your side and bend it 90 degrees.  Step into a door frame so that the outside of your right / left wrist can press against the door frame without your upper arm leaving your side.  Gently press your right / left wrist into the door frame, as if you were trying to swing the back of your hand away from your stomach. Gradually increase the tension, until you are  pressing as hard as you can, without shrugging your shoulder or increasing any shoulder discomfort.  Hold for __________ seconds.  Release the tension slowly. Relax your shoulder muscles completely before you begin the next repetition. Repeat __________ times. Complete this exercise __________ times per day.  STRENGTH - Supraspinatus   Stand or sit with good posture. Grasp a __________ weight, or an exercise band or tubing, so that your hand is "thumbs-up," like you are shaking hands.  Slowly lift your right / left arm in a "V" away from your thigh, diagonally into the space between your side and straight ahead. Lift your hand to shoulder height or as far as you can, without increasing any shoulder pain. At  first, many people do not lift their hands above shoulder height.  Avoid shrugging your right / left shoulder as your arm rises, by keeping your shoulder blade tucked down and toward your mid-back spine.  Hold for __________ seconds. Control the descent of your hand, as you slowly return to your starting position. Repeat __________ times. Complete this exercise __________ times per day.  STRENGTH - External Rotators  Secure a rubber exercise band or tubing to a fixed object (table, pole) so that it is at the same height as your right / left elbow when you are standing or sitting on a firm surface.  Stand or sit so that the secured exercise band is at your uninjured side.  Bend your right / left elbow 90 degrees. Place a folded towel or small pillow under your right / left arm, so that your elbow is a few inches away from your side.  Keeping the tension on the exercise band, pull it away from your body, as if pivoting on your elbow. Be sure to keep your body steady, so that the movement is coming only from your rotating shoulder.  Hold for __________ seconds. Release the tension in a controlled manner, as you return to the starting position. Repeat __________ times. Complete this  exercise __________ times per day.  STRENGTH - Internal Rotators   Secure a rubber exercise band or tubing to a fixed object (table, pole) so that it is at the same height as your right / left elbow when you are standing or sitting on a firm surface.  Stand or sit so that the secured exercise band is at your right / left side.  Bend your elbow 90 degrees. Place a folded towel or small pillow under your right / left arm so that your elbow is a few inches away from your side.  Keeping the tension on the exercise band, pull it across your body, toward your stomach. Be sure to keep your body steady, so that the movement is coming only from your rotating shoulder.  Hold for __________ seconds. Release the tension in a controlled manner, as you return to the starting position. Repeat __________ times. Complete this exercise __________ times per day.  STRENGTH - Scapular Protractors, Standing   Stand arms length away from a wall. Place your hands on the wall, keeping your elbows straight.  Begin by dropping your shoulder blades down and toward your mid-back spine.  To strengthen your protractors, keep your shoulder blades down, but slide them forward on your rib cage. It will feel as if you are lifting the back of your rib cage away from the wall. This is a subtle motion and can be challenging to complete. Ask your caregiver for further instruction, if you are not sure you are doing the exercise correctly.  Hold for __________ seconds. Slowly return to the starting position, resting the muscles completely before starting the next repetition. Repeat __________ times. Complete this exercise __________ times per day. STRENGTH - Scapular Protractors, Supine  Lie on your back on a firm surface. Extend your right / left arm straight into the air while holding a __________ weight in your hand.  Keeping your head and back in place, lift your shoulder off the floor.  Hold for __________ seconds. Slowly  return to the starting position, and allow your muscles to relax completely before starting the next repetition. Repeat __________ times. Complete this exercise __________ times per day. STRENGTH - Scapular Protractors, Quadruped  Get onto your  hands and knees, with your shoulders directly over your hands (or as close as you can be, comfortably).  Keeping your elbows locked, lift the back of your rib cage up into your shoulder blades, so your mid-back rounds out. Keep your neck muscles relaxed.  Hold this position for __________ seconds. Slowly return to the starting position and allow your muscles to relax completely before starting the next repetition. Repeat __________ times. Complete this exercise __________ times per day.  STRENGTH - Scapular Retractors  Secure a rubber exercise band or tubing to a fixed object (table, pole), so that it is at the height of your shoulders when you are either standing, or sitting on a firm armless chair.  With a palm down grip, grasp an end of the band in each hand. Straighten your elbows and lift your hands straight in front of you, at shoulder height. Step back, away from the secured end of the band, until it becomes tense.  Squeezing your shoulder blades together, draw your elbows back toward your sides, as you bend them. Keep your upper arms lifted away from your body throughout the exercise.  Hold for __________ seconds. Slowly ease the tension on the band, as you reverse the directions and return to the starting position. Repeat __________ times. Complete this exercise __________ times per day. STRENGTH - Shoulder Extensors   Secure a rubber exercise band or tubing to a fixed object (table, pole) so that it is at the height of your shoulders when you are either standing, or sitting on a firm armless chair.  With a thumbs-up grip, grasp an end of the band in each hand. Straighten your elbows and lift your hands straight in front of you, at shoulder  height. Step back, away from the secured end of the band, until it becomes tense.  Squeezing your shoulder blades together, pull your hands down to the sides of your thighs. Do not allow your hands to go behind you.  Hold for __________ seconds. Slowly ease the tension on the band, as you reverse the directions and return to the starting position. Repeat __________ times. Complete this exercise __________ times per day.  STRENGTH - Scapular Retractors and External Rotators   Secure a rubber exercise band or tubing to a fixed object (table, pole) so that it is at the height as your shoulders, when you are either standing, or sitting on a firm armless chair.  With a palm down grip, grasp an end of the band in each hand. Bend your elbows 90 degrees and lift your elbows to shoulder height, at your sides. Step back, away from the secured end of the band, until it becomes tense.  Squeezing your shoulder blades together, rotate your shoulders so that your upper arms and elbows remain stationary, but your fists travel upward to head height.  Hold for __________ seconds. Slowly ease the tension on the band, as you reverse the directions and return to the starting position. Repeat __________ times. Complete this exercise __________ times per day.  STRENGTH - Scapular Retractors and External Rotators, Rowing   Secure a rubber exercise band or tubing to a fixed object (table, pole) so that it is at the height of your shoulders, when you are either standing, or sitting on a firm armless chair.  With a palm down grip, grasp an end of the band in each hand. Straighten your elbows and lift your hands straight in front of you, at shoulder height. Step back, away from the secured  end of the band, until it becomes tense.  Step 1: Squeeze your shoulder blades together. Bending your elbows, draw your hands to your chest, as if you are rowing a boat. At the end of this motion, your hands and elbow should be at  shoulder height and your elbows should be out to your sides.  Step 2: Rotate your shoulders, to raise your hands above your head. Your forearms should be vertical and your upper arms should be horizontal.  Hold for __________ seconds. Slowly ease the tension on the band, as you reverse the directions and return to the starting position. Repeat __________ times. Complete this exercise __________ times per day.  STRENGTH - Scapular Depressors  Find a sturdy chair without wheels, such as a dining room chair.  Keeping your feet on the floor, and your hands on the chair arms, lift your bottom up from the seat, and lock your elbows.  Keeping your elbows straight, allow gravity to pull your body weight down. Your shoulders will rise toward your ears.  Raise your body against gravity by drawing your shoulder blades down your back, shortening the distance between your shoulders and ears. Although your feet should always maintain contact with the floor, your feet should progressively support less body weight, as you get stronger.  Hold for __________ seconds. In a controlled and slow manner, lower your body weight to begin the next repetition. Repeat __________ times. Complete this exercise __________ times per day.  Document Released: 03/14/2005 Document Revised: 06/06/2011 Document Reviewed: 06/26/2008 [facility] Patient Information 2015 [place], [place]. This information is not intended to replace advice given to you by your health care provider. Make sure you discuss any questions you have with your health care provider.   Insomnia Insomnia is frequent trouble falling and/or staying asleep. Insomnia can be a long term problem or a short term problem. Both are common. Insomnia can be a short term problem when the wakefulness is related to a certain stress or worry. Long term insomnia is often related to ongoing stress during waking hours and/or poor sleeping habits. Overtime, sleep deprivation itself  can make the problem worse. Every little thing feels more severe because you are overtired and your ability to cope is decreased. CAUSES   Stress, anxiety, and depression.  Poor sleeping habits.  Distractions such as TV in the bedroom.  Naps close to bedtime.  Engaging in emotionally charged conversations before bed.  Technical reading before sleep.  Alcohol and other sedatives. They may make the problem worse. They can hurt normal sleep patterns and normal dream activity.  Stimulants such as caffeine for several hours prior to bedtime.  Pain syndromes and shortness of breath can cause insomnia.  Exercise late at night.  Changing time zones may cause sleeping problems (jet lag). It is sometimes helpful to have someone observe your sleeping patterns. They should look for periods of not breathing during the night (sleep apnea). They should also look to see how long those periods last. If you live alone or observers are uncertain, you can also be observed at a sleep clinic where your sleep patterns will be professionally monitored. Sleep apnea requires a checkup and treatment. Give your caregivers your medical history. Give your caregivers observations your family has made about your sleep.  SYMPTOMS   Not feeling rested in the morning.  Anxiety and restlessness at bedtime.  Difficulty falling and staying asleep. TREATMENT   Your caregiver may prescribe treatment for an underlying medical disorders. Your caregiver can  give advice or help if you are using alcohol or other drugs for self-medication. Treatment of underlying problems will usually eliminate insomnia problems.  Medications can be prescribed for short time use. They are generally not recommended for lengthy use.  Over-the-counter sleep medicines are not recommended for lengthy use. They can be habit forming.  You can promote easier sleeping by making lifestyle changes such as:  Using relaxation techniques that help  with breathing and reduce muscle tension.  Exercising earlier in the day.  Changing your diet and the time of your last meal. No night time snacks.  Establish a regular time to go to bed.  Counseling can help with stressful problems and worry.  Soothing music and white noise may be helpful if there are background noises you cannot remove.  Stop tedious detailed work at least one hour before bedtime. HOME CARE INSTRUCTIONS   Keep a diary. Inform your caregiver about your progress. This includes any medication side effects. See your caregiver regularly. Take note of:  Times when you are asleep.  Times when you are awake during the night.  The quality of your sleep.  How you feel the next day. This information will help your caregiver care for you.  Get out of bed if you are still awake after 15 minutes. Read or do some quiet activity. Keep the lights down. Wait until you feel sleepy and go back to bed.  Keep regular sleeping and waking hours. Avoid naps.  Exercise regularly.  Avoid distractions at bedtime. Distractions include watching television or engaging in any intense or detailed activity like attempting to balance the household checkbook.  Develop a bedtime ritual. Keep a familiar routine of bathing, brushing your teeth, climbing into bed at the same time each night, listening to soothing music. Routines increase the success of falling to sleep faster.  Use relaxation techniques. This can be using breathing and muscle tension release routines. It can also include visualizing peaceful scenes. You can also help control troubling or intruding thoughts by keeping your mind occupied with boring or repetitive thoughts like the old concept of counting sheep. You can make it more creative like imagining planting one beautiful flower after another in your backyard garden.  During your day, work to eliminate stress. When this is not possible use some of the previous suggestions to  help reduce the anxiety that accompanies stressful situations. MAKE SURE YOU:   Understand these instructions.  Will watch your condition.  Will get help right away if you are not doing well or get worse. Document Released: 03/11/2000 Document Revised: 06/06/2011 Document Reviewed: 04/11/2007 [facility] Patient Information 2015 [place], [place]. This information is not intended to replace advice given to you by your health care provider. Make sure you discuss any questions you have with your health care provider.

## 2014-04-21 ENCOUNTER — Telehealth: Payer: Self-pay

## 2014-04-21 DIAGNOSIS — M542 Cervicalgia: Secondary | ICD-10-CM

## 2014-04-21 DIAGNOSIS — M5481 Occipital neuralgia: Secondary | ICD-10-CM

## 2014-04-21 DIAGNOSIS — G629 Polyneuropathy, unspecified: Secondary | ICD-10-CM

## 2014-04-21 MED ORDER — GABAPENTIN 300 MG PO CAPS: 300.0000 mg | ORAL_CAPSULE | Freq: Two times a day (BID) | ORAL | Status: DC | PRN

## 2014-04-21 NOTE — Telephone Encounter (Signed)
Pt states his gabapentin went to local pharmacy instead of his 90 day supply to [place] advise    Best phone for pt is [phone] or [phone]

## 2014-04-21 NOTE — Telephone Encounter (Signed)
Prescription resent to the correct pharmacy. Pt advised.

## 2014-04-23 ENCOUNTER — Telehealth: Payer: Self-pay | Admitting: Radiology

## 2014-04-23 NOTE — Telephone Encounter (Signed)
Pt calling about lab results. Please review.

## 2014-04-24 ENCOUNTER — Other Ambulatory Visit: Payer: Self-pay

## 2014-04-24 DIAGNOSIS — M542 Cervicalgia: Secondary | ICD-10-CM

## 2014-04-24 DIAGNOSIS — G629 Polyneuropathy, unspecified: Secondary | ICD-10-CM

## 2014-04-24 DIAGNOSIS — M5481 Occipital neuralgia: Secondary | ICD-10-CM

## 2014-04-24 MED ORDER — GABAPENTIN 300 MG PO CAPS: 300.0000 mg | ORAL_CAPSULE | Freq: Two times a day (BID) | ORAL | Status: DC | PRN

## 2014-04-25 ENCOUNTER — Telehealth: Payer: Self-pay | Admitting: *Deleted

## 2014-04-25 NOTE — Telephone Encounter (Signed)
Patient states that he wants his lab results from his [facility] on 04/14/2014. Patient wants to be called and he also wants them mailed to his house.   [phone]

## 2014-04-25 NOTE — Telephone Encounter (Signed)
Called pt. Pt has been advised of results and the results mailed to him also with the letter below. Letter sent VIA mychart:  [name] [name],   Your cholesterol is still elevated, and a little higher than in the past year. With the difficulty in taking the statin in past, would not necessarily try that again, but would really work on diet approach with avoiding fried/fatty food and also starchy foods as recommended by Dr. Ellyn Hack. I will forward a copy of these labs to him as well.  Your blood counts, gout test, kidney and liver tests, and other electrolytes were normal. Let me know if you have any questions.  -Dr Carlota Raspberry   Pt asked to have his mychart deactivated- I have done this as he no longer has a computer.

## 2014-04-25 NOTE — Telephone Encounter (Signed)
I spoke to patient concerning Prevnar 13 vaccine, per patient he did not receive. He discussed the vaccine with you and decided to get it later. Also, he will make an appointment to see you to discuss issues and the get vaccine. I deleted the vaccine so you can close the chart.

## 2014-04-28 ENCOUNTER — Ambulatory Visit (INDEPENDENT_AMBULATORY_CARE_PROVIDER_SITE_OTHER): Payer: Medicare Other | Admitting: Family Medicine

## 2014-04-28 ENCOUNTER — Ambulatory Visit (INDEPENDENT_AMBULATORY_CARE_PROVIDER_SITE_OTHER): Payer: Medicare Other

## 2014-04-28 VITALS — BP 122/68 | HR 82 | Temp 97.6°F | Resp 18 | Ht 68.0 in | Wt 209.0 lb

## 2014-04-28 DIAGNOSIS — M25511 Pain in right shoulder: Secondary | ICD-10-CM

## 2014-04-28 DIAGNOSIS — M7581 Other shoulder lesions, right shoulder: Secondary | ICD-10-CM

## 2014-04-28 DIAGNOSIS — M7580 Other shoulder lesions, unspecified shoulder: Secondary | ICD-10-CM | POA: Insufficient documentation

## 2014-04-28 DIAGNOSIS — M12511 Traumatic arthropathy, right shoulder: Secondary | ICD-10-CM

## 2014-04-28 MED ORDER — HYDROCODONE-ACETAMINOPHEN 5-325 MG PO TABS: 1.0000 | ORAL_TABLET | Freq: Three times a day (TID) | ORAL | Status: DC | PRN
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
## 2014-04-30 ENCOUNTER — Telehealth: Payer: Self-pay

## 2014-04-30 NOTE — Telephone Encounter (Signed)
Right shoulder x-ray disc needed this afternoon  [phone]

## 2014-04-30 NOTE — Telephone Encounter (Signed)
Copy of message taken to x-ray. aagb

## 2014-05-01 NOTE — Telephone Encounter (Signed)
See 04/25/14 note.   Should be Prevnar that he was needing.  Can have performed as procedure visit only. THanks. Lavone Nian.

## 2014-05-01 NOTE — Telephone Encounter (Signed)
-----   Message from Glenford Bayley, DO sent at 04/29/2014  1:26 PM EST ----- Regarding: Prevnar 13 He came in requesting a second shingles vaccine, I told him he got one in 2015, so I do not think he needs one. He is adament that is what you told him and wants me to check with you. I think he needs the Prevnar 13, already has had Pneumovax 23. He is a talker: )   Told him I would ask you.  Thanks  [place]

## 2014-05-02 DIAGNOSIS — M25511 Pain in right shoulder: Secondary | ICD-10-CM | POA: Diagnosis not present

## 2014-05-02 NOTE — Telephone Encounter (Signed)
Left message for pt to call back  °

## 2014-05-05 NOTE — Telephone Encounter (Signed)
Left message for pt to call back  °

## 2014-05-05 NOTE — Telephone Encounter (Signed)
Spoke with pt, advised message from Dr. Carlota Raspberry. Pt will come in to receive a Prevnar.

## 2014-05-06 ENCOUNTER — Ambulatory Visit (INDEPENDENT_AMBULATORY_CARE_PROVIDER_SITE_OTHER): Payer: Medicare Other | Admitting: *Deleted

## 2014-05-06 DIAGNOSIS — Z23 Encounter for immunization: Secondary | ICD-10-CM | POA: Diagnosis not present

## 2014-05-09 DIAGNOSIS — M25511 Pain in right shoulder: Secondary | ICD-10-CM | POA: Diagnosis not present

## 2014-05-16 DIAGNOSIS — S46011D Strain of muscle(s) and tendon(s) of the rotator cuff of right shoulder, subsequent encounter: Secondary | ICD-10-CM | POA: Diagnosis not present

## 2014-05-27 DIAGNOSIS — Y929 Unspecified place or not applicable: Secondary | ICD-10-CM | POA: Diagnosis not present

## 2014-05-27 DIAGNOSIS — M24111 Other articular cartilage disorders, right shoulder: Secondary | ICD-10-CM | POA: Diagnosis not present

## 2014-05-27 DIAGNOSIS — M7541 Impingement syndrome of right shoulder: Secondary | ICD-10-CM | POA: Diagnosis not present

## 2014-05-27 DIAGNOSIS — M66821 Spontaneous rupture of other tendons, right upper arm: Secondary | ICD-10-CM | POA: Diagnosis not present

## 2014-05-27 DIAGNOSIS — G8918 Other acute postprocedural pain: Secondary | ICD-10-CM | POA: Diagnosis not present

## 2014-05-27 DIAGNOSIS — M19011 Primary osteoarthritis, right shoulder: Secondary | ICD-10-CM | POA: Diagnosis not present

## 2014-05-27 DIAGNOSIS — S46011A Strain of muscle(s) and tendon(s) of the rotator cuff of right shoulder, initial encounter: Secondary | ICD-10-CM | POA: Diagnosis not present

## 2014-05-27 DIAGNOSIS — S43431A Superior glenoid labrum lesion of right shoulder, initial encounter: Secondary | ICD-10-CM | POA: Diagnosis not present

## 2014-05-27 DIAGNOSIS — Y998 Other external cause status: Secondary | ICD-10-CM | POA: Diagnosis not present

## 2014-06-04 DIAGNOSIS — S46011D Strain of muscle(s) and tendon(s) of the rotator cuff of right shoulder, subsequent encounter: Secondary | ICD-10-CM | POA: Diagnosis not present

## 2014-06-05 ENCOUNTER — Telehealth: Payer: Self-pay | Admitting: *Deleted

## 2014-06-05 NOTE — Telephone Encounter (Signed)
LATE ENTRY  FAXED ON 05/27/14 CLEARANCE FOR RIGHT SHOULDER SURGERY PERFORMED BY DR SUPPLE

## 2014-06-12 DIAGNOSIS — S46011D Strain of muscle(s) and tendon(s) of the rotator cuff of right shoulder, subsequent encounter: Secondary | ICD-10-CM | POA: Diagnosis not present

## 2014-06-19 DIAGNOSIS — S46011D Strain of muscle(s) and tendon(s) of the rotator cuff of right shoulder, subsequent encounter: Secondary | ICD-10-CM | POA: Diagnosis not present

## 2014-06-26 ENCOUNTER — Ambulatory Visit (INDEPENDENT_AMBULATORY_CARE_PROVIDER_SITE_OTHER): Payer: Medicare Other | Admitting: Emergency Medicine

## 2014-06-26 ENCOUNTER — Other Ambulatory Visit: Payer: Self-pay | Admitting: Emergency Medicine

## 2014-06-26 ENCOUNTER — Other Ambulatory Visit: Payer: Self-pay

## 2014-06-26 ENCOUNTER — Ambulatory Visit (HOSPITAL_BASED_OUTPATIENT_CLINIC_OR_DEPARTMENT_OTHER)
Admission: RE | Admit: 2014-06-26 | Discharge: 2014-06-26 | Disposition: A | Discharge status: Home / Self Care | Payer: Medicare Other | Source: Ambulatory Visit | Attending: Emergency Medicine | Admitting: Emergency Medicine

## 2014-06-26 VITALS — BP 116/78 | HR 99 | Temp 97.4°F | Resp 18 | Ht 68.0 in | Wt 205.6 lb

## 2014-06-26 DIAGNOSIS — R7989 Other specified abnormal findings of blood chemistry: Secondary | ICD-10-CM

## 2014-06-26 DIAGNOSIS — M79605 Pain in left leg: Secondary | ICD-10-CM | POA: Insufficient documentation

## 2014-06-26 DIAGNOSIS — M79604 Pain in right leg: Secondary | ICD-10-CM | POA: Insufficient documentation

## 2014-06-26 DIAGNOSIS — R531 Weakness: Secondary | ICD-10-CM

## 2014-06-26 DIAGNOSIS — M353 Polymyalgia rheumatica: Secondary | ICD-10-CM | POA: Insufficient documentation

## 2014-06-26 DIAGNOSIS — S46011D Strain of muscle(s) and tendon(s) of the rotator cuff of right shoulder, subsequent encounter: Secondary | ICD-10-CM | POA: Diagnosis not present

## 2014-06-26 DIAGNOSIS — M2550 Pain in unspecified joint: Secondary | ICD-10-CM | POA: Diagnosis not present

## 2014-06-26 DIAGNOSIS — R778 Other specified abnormalities of plasma proteins: Secondary | ICD-10-CM | POA: Insufficient documentation

## 2014-06-26 LAB — CBC WITH DIFFERENTIAL/PLATELET
Basophils Absolute: 0.1 10*3/uL (ref 0.0–0.1)
Basophils Relative: 1 % (ref 0–1)
Eosinophils Absolute: 0.3 10*3/uL (ref 0.0–0.7)
Eosinophils Relative: 4 % (ref 0–5)
HCT: 41.9 % (ref 39.0–52.0)
Hemoglobin: 14.2 g/dL (ref 13.0–17.0)
Lymphocytes Relative: 40 % (ref 12–46)
Lymphs Abs: 3.3 10*3/uL (ref 0.7–4.0)
MCH: 29.5 pg (ref 26.0–34.0)
MCHC: 33.9 g/dL (ref 30.0–36.0)
MCV: 87.1 fL (ref 78.0–100.0)
MPV: 11.3 fL (ref 8.6–12.4)
Monocytes Absolute: 0.5 10*3/uL (ref 0.1–1.0)
Monocytes Relative: 6 % (ref 3–12)
Neutro Abs: 4 10*3/uL (ref 1.7–7.7)
Neutrophils Relative %: 49 % (ref 43–77)
Platelets: 187 10*3/uL (ref 150–400)
RBC: 4.81 MIL/uL (ref 4.22–5.81)
RDW: 13.9 % (ref 11.5–15.5)
WBC: 8.2 10*3/uL (ref 4.0–10.5)

## 2014-06-26 LAB — TSH: TSH: 1.244 u[IU]/mL (ref 0.350–4.500)

## 2014-06-26 LAB — COMPREHENSIVE METABOLIC PANEL
ALT: 22 U/L (ref 0–53)
AST: 18 U/L (ref 0–37)
Albumin: 4.5 g/dL (ref 3.5–5.2)
Alkaline Phosphatase: 68 U/L (ref 39–117)
BUN: 16 mg/dL (ref 6–23)
CO2: 25 mEq/L (ref 19–32)
Calcium: 9.7 mg/dL (ref 8.4–10.5)
Chloride: 102 mEq/L (ref 96–112)
Creat: 1.03 mg/dL (ref 0.50–1.35)
Glucose, Bld: 99 mg/dL (ref 70–99)
Potassium: 4.7 mEq/L (ref 3.5–5.3)
Sodium: 138 mEq/L (ref 135–145)
Total Bilirubin: 0.4 mg/dL (ref 0.2–1.2)
Total Protein: 7.4 g/dL (ref 6.0–8.3)

## 2014-06-26 LAB — CK: Total CK: 197 U/L (ref 7–232)

## 2014-06-26 LAB — D-DIMER, QUANTITATIVE: D-Dimer, Quant: 1.76 ug/mL-FEU — ABNORMAL HIGH (ref 0.00–0.48)

## 2014-06-26 MED ORDER — MELOXICAM 15 MG PO TABS: 15.0000 mg | ORAL_TABLET | Freq: Every day | ORAL | Status: DC

## 2014-06-26 NOTE — Addendum Note (Signed)
Addended by: Roselee Culver on: 06/26/2014 05:06 PM   Modules accepted: Orders

## 2014-06-26 NOTE — Addendum Note (Signed)
Addended by: Burnis Kingfisher on: 06/26/2014 06:14 PM   Modules accepted: Orders

## 2014-06-26 NOTE — Progress Notes (Signed)
[facility] [address] [place] [address] [phone]  Date:  06/26/2014   Name:  [name] [name]   DOB:  [birth year]   MRN:  [id number]  PCP:  [name], MD    Chief Complaint: Shoulder Pain and Generalized Body Aches   History of Present Illness:  [name] [name] is a 69 y.o. 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 No history of injury or overuse No rash Says not sleeping well and gets up 3-4 times a night due to pain. No improvement with over the counter medications or other home remedies. Denies other complaint or health concern today.   Patient Active Problem List   Diagnosis Date Noted  . Rotator cuff tendinitis 04/28/2014  . Obesity (BMI 30-39.9) 12/29/2013  . 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  . Exertional shortness of breath 06/26/2012  . Polyp of colon, adenomatous 04/27/2012  . Elevated PSA 10/20/2011  . Hypertriglyceridemia 07/08/2011  . Nonischemic cardiomyopathy 09/04/2009    Past Medical History  Diagnosis Date  . Arthritis   . Nonischemic cardiomyopathy     09-04-2009  --  2D echo-EF 40-45%, Global Hypokinesis  . Hyperlipemia   . Hypertriglyceridemia   . Coronary atherosclerosis of native coronary vessel     CATH 2004--  MINIMAL NONOBSTRUCTIVE LAD/  NORMAL EF/    CARDIAC CT  03/2008  NO SIGNIFICANT DISEASE  AND  nlef  . History of melanoma excision     2011-- LEFT UPPER BACK  . History of gout     pt  05-20-2013 states stable   . Elevated PSA   . BPH (benign prostatic hypertrophy)   . Personal history of arthritis   . Personal history of other diseases of circulatory system     Past Surgical History  Procedure Laterality Date  . Transthoracic echocardiogram  09-04-2009    MILD LVH/  MILD GLOBAL HYPOKINESIS/ LVEF 40-45%/  MILD AV SCLEROSIS WITHOUT STENOSIS/  MILD PVR  . Cardiovascular stress test  10-05-1998    MILD GLOBAL HYPOKINESIS AND ISCHEMIAIN ANTEROSEPTAL  AT [facility]/ EF 42%  . Lumbar disc surgery  09-12-2000    left  L5 -- S1  . Excision melanoma left upper back  10-14-2009  . Transrectal ultrasound prostate bx  05-23-2005  &  04-19-2001  . Cardiac catheterization  05/23/2002  &  05-23-1998   DR AL LITTLE    minimal irregularities in the LAD/  EF 50%  . Appendectomy  AS CHILD  . Prostate biopsy N/A 05/22/2013    Procedure: BIOPSY TRANSRECTAL ULTRASONIC PROSTATE (TUBP);  Surgeon: [name], MD;  Location: [facility];  Service: Urology;  Laterality: N/A;    History  Substance Use Topics  . Smoking status: Former Smoker -- 2.00 packs/day for 20 years    Types: Cigarettes    Quit date: 03/28/1980  . Smokeless tobacco: Never Used  . Alcohol Use: No    Family History  Problem  Relation Age of Onset  . Cardiomyopathy      idiopathic. trivial disease 2004 cath, 2010 cardiac CT - no sig. dz, nl LV fxn. cards: Little  . Arthritis Mother   . COPD Father   . Cancer Father     lung cancer  . Cancer Sister   . Colon cancer Neg Hx     Allergies  Allergen Reactions  . Nitroglycerin Anaphylaxis    "Cardiologist suggested he shouldn't take this med because it could kill him with his heart condition. Lowers BP"  . Ace Inhibitors Swelling  . Lovastatin Nausea Only    Medication list has been reviewed and updated.  Current Outpatient Prescriptions on File Prior to Visit  Medication Sig Dispense Refill  . allopurinol (ZYLOPRIM) 100 MG tablet Take 100 mg by  mouth as needed.     . Cyanocobalamin (B-12 SL) Place 1 tablet under the tongue daily.    . fish oil-omega-3 fatty acids 1000 MG capsule Take 1 g by mouth daily.    Marland Kitchen gabapentin (NEURONTIN) 300 MG capsule Take 1-2 capsules (300-600 mg total) by mouth 2 (two) times daily as needed. 360 capsule 1  . Garlic TABS Take 1 tablet by mouth daily.    . Misc Natural Products (SAW PALMETTO) CAPS Take 1 capsule by mouth daily.    . Multiple Vitamins-Minerals (MULTIVITAMIN PO) Take 1 tablet by mouth daily.    . Pomegranate, Punica granatum, (POMEGRANATE PO) Take 1 capsule by mouth daily.    . Red Yeast Rice Extract (RED YEAST RICE PO) Take 1 capsule by mouth daily.    . sildenafil (VIAGRA) 100 MG tablet 1/2 to 1 tablet up to each day as needed. 10 tablet 5  . HYDROcodone-acetaminophen (NORCO/VICODIN) 5-325 MG per tablet Take 1 tablet by mouth every 8 (eight) hours as needed for moderate pain. Take with stool softener, do not take extra tylenol (Patient not taking: Reported on 06/26/2014) 30 tablet 0  . zoster vaccine live, PF, (ZOSTAVAX) 73419 UNT/0.65ML injection Inject 19,400 Units into the skin once. (Patient not taking: Reported on 06/26/2014) 1 each 0   No current facility-administered medications on file prior to visit.    Review of Systems:   As per HPI, otherwise negative.   Physical Examination: Filed Vitals:   06/26/14 1126  BP: 116/78  Pulse: 99  Temp: 97.4 F (36.3 C)  Resp: 18   Filed Vitals:   06/26/14 1126  Height: 5\' 8"  (1.727 m)  Weight: 205 lb 9.6 oz (93.26 kg)   Body mass index is 31.27 kg/(m^2). Ideal Body Weight: Weight in (lb) to have BMI = 25: 164.1  GEN: obese, NAD, Non-toxic, A & O x 3 HEENT: Atraumatic, Normocephalic. Neck supple. No masses, No LAD. Ears and Nose: No external deformity. CV: RRR, No M/G/R. No JVD. No thrill. No extra heart sounds. PULM: CTA B, no wheezes, crackles, rhonchi. No retractions. No resp. distress. No accessory muscle use. ABD: S, NT,  ND, +BS. No rebound. No HSM. EXTR: No c/c/e NEURO Normal gait. Impaired tandem gait.  Heel and toe walk normal. Normal romberg.  Motor intact PSYCH: Normally interactive. Conversant. Not depressed or anxious appearing.  Calm demeanor.     Assessment and Plan: Generalized weakness and pain mobic Labs pending Advised to avoid OTC NSAIDS.  Signed,  Ellison Carwin, MD

## 2014-06-26 NOTE — Addendum Note (Signed)
Addended by: Frutoso Chase A on: 06/26/2014 06:01 PM   Modules accepted: Orders

## 2014-06-26 NOTE — Addendum Note (Signed)
Addended by: Venetia Night on: 06/26/2014 01:38 PM   Modules accepted: Orders

## 2014-06-27 ENCOUNTER — Telehealth: Payer: Self-pay

## 2014-06-27 NOTE — Telephone Encounter (Signed)
Pt called about [place]. Let him know it was negative. Please advise on next steps. Thanks

## 2014-06-27 NOTE — Telephone Encounter (Signed)
Patient called. I let him know his results were negative (per previous phone message). He understood. He wants a copy mailed to his home.

## 2014-06-28 LAB — MYOGLOBIN, SERUM: Myoglobin: 73 mcg/L — ABNORMAL HIGH (ref <=50)

## 2014-06-28 NOTE — Telephone Encounter (Signed)
Pt mailed copy.

## 2014-07-02 DIAGNOSIS — Z4789 Encounter for other orthopedic aftercare: Secondary | ICD-10-CM | POA: Diagnosis not present

## 2014-07-02 DIAGNOSIS — S46011D Strain of muscle(s) and tendon(s) of the rotator cuff of right shoulder, subsequent encounter: Secondary | ICD-10-CM | POA: Diagnosis not present

## 2014-07-03 DIAGNOSIS — H2513 Age-related nuclear cataract, bilateral: Secondary | ICD-10-CM | POA: Diagnosis not present

## 2014-07-10 DIAGNOSIS — S46011D Strain of muscle(s) and tendon(s) of the rotator cuff of right shoulder, subsequent encounter: Secondary | ICD-10-CM | POA: Diagnosis not present

## 2014-07-14 DIAGNOSIS — S46011D Strain of muscle(s) and tendon(s) of the rotator cuff of right shoulder, subsequent encounter: Secondary | ICD-10-CM | POA: Diagnosis not present

## 2014-07-15 DIAGNOSIS — L820 Inflamed seborrheic keratosis: Secondary | ICD-10-CM | POA: Diagnosis not present

## 2014-07-15 DIAGNOSIS — Z85820 Personal history of malignant melanoma of skin: Secondary | ICD-10-CM | POA: Diagnosis not present

## 2014-07-15 DIAGNOSIS — D225 Melanocytic nevi of trunk: Secondary | ICD-10-CM | POA: Diagnosis not present

## 2014-07-15 DIAGNOSIS — L814 Other melanin hyperpigmentation: Secondary | ICD-10-CM | POA: Diagnosis not present

## 2014-07-15 DIAGNOSIS — L570 Actinic keratosis: Secondary | ICD-10-CM | POA: Diagnosis not present

## 2014-07-15 DIAGNOSIS — Z08 Encounter for follow-up examination after completed treatment for malignant neoplasm: Secondary | ICD-10-CM | POA: Diagnosis not present

## 2014-07-17 DIAGNOSIS — S46011D Strain of muscle(s) and tendon(s) of the rotator cuff of right shoulder, subsequent encounter: Secondary | ICD-10-CM | POA: Diagnosis not present

## 2014-07-21 DIAGNOSIS — S46011D Strain of muscle(s) and tendon(s) of the rotator cuff of right shoulder, subsequent encounter: Secondary | ICD-10-CM | POA: Diagnosis not present

## 2014-07-24 DIAGNOSIS — S46011D Strain of muscle(s) and tendon(s) of the rotator cuff of right shoulder, subsequent encounter: Secondary | ICD-10-CM | POA: Diagnosis not present

## 2014-07-28 DIAGNOSIS — S46011D Strain of muscle(s) and tendon(s) of the rotator cuff of right shoulder, subsequent encounter: Secondary | ICD-10-CM | POA: Diagnosis not present

## 2014-07-31 DIAGNOSIS — S46011D Strain of muscle(s) and tendon(s) of the rotator cuff of right shoulder, subsequent encounter: Secondary | ICD-10-CM | POA: Diagnosis not present

## 2014-08-04 DIAGNOSIS — S46011D Strain of muscle(s) and tendon(s) of the rotator cuff of right shoulder, subsequent encounter: Secondary | ICD-10-CM | POA: Diagnosis not present

## 2014-08-06 DIAGNOSIS — Z4789 Encounter for other orthopedic aftercare: Secondary | ICD-10-CM | POA: Diagnosis not present

## 2014-08-06 DIAGNOSIS — S46011D Strain of muscle(s) and tendon(s) of the rotator cuff of right shoulder, subsequent encounter: Secondary | ICD-10-CM | POA: Diagnosis not present

## 2014-08-12 DIAGNOSIS — S46011D Strain of muscle(s) and tendon(s) of the rotator cuff of right shoulder, subsequent encounter: Secondary | ICD-10-CM | POA: Diagnosis not present

## 2014-08-15 DIAGNOSIS — S46011D Strain of muscle(s) and tendon(s) of the rotator cuff of right shoulder, subsequent encounter: Secondary | ICD-10-CM | POA: Diagnosis not present

## 2014-08-19 DIAGNOSIS — S46011D Strain of muscle(s) and tendon(s) of the rotator cuff of right shoulder, subsequent encounter: Secondary | ICD-10-CM | POA: Diagnosis not present

## 2014-08-26 DIAGNOSIS — S46011D Strain of muscle(s) and tendon(s) of the rotator cuff of right shoulder, subsequent encounter: Secondary | ICD-10-CM | POA: Diagnosis not present

## 2014-08-29 DIAGNOSIS — S46011D Strain of muscle(s) and tendon(s) of the rotator cuff of right shoulder, subsequent encounter: Secondary | ICD-10-CM | POA: Diagnosis not present

## 2014-09-02 DIAGNOSIS — S46011D Strain of muscle(s) and tendon(s) of the rotator cuff of right shoulder, subsequent encounter: Secondary | ICD-10-CM | POA: Diagnosis not present

## 2014-09-03 DIAGNOSIS — Z4789 Encounter for other orthopedic aftercare: Secondary | ICD-10-CM | POA: Diagnosis not present

## 2014-09-03 DIAGNOSIS — S46011D Strain of muscle(s) and tendon(s) of the rotator cuff of right shoulder, subsequent encounter: Secondary | ICD-10-CM | POA: Diagnosis not present

## 2014-09-11 ENCOUNTER — Ambulatory Visit (INDEPENDENT_AMBULATORY_CARE_PROVIDER_SITE_OTHER): Payer: Medicare Other | Admitting: Cardiology

## 2014-09-11 ENCOUNTER — Encounter: Payer: Self-pay | Admitting: Cardiology

## 2014-09-11 VITALS — BP 108/62 | HR 92 | Ht 69.0 in | Wt 208.8 lb

## 2014-09-11 DIAGNOSIS — I429 Cardiomyopathy, unspecified: Secondary | ICD-10-CM

## 2014-09-11 DIAGNOSIS — E669 Obesity, unspecified: Secondary | ICD-10-CM | POA: Diagnosis not present

## 2014-09-11 DIAGNOSIS — R0602 Shortness of breath: Secondary | ICD-10-CM | POA: Diagnosis not present

## 2014-09-11 DIAGNOSIS — E785 Hyperlipidemia, unspecified: Secondary | ICD-10-CM | POA: Diagnosis not present

## 2014-09-11 DIAGNOSIS — I428 Other cardiomyopathies: Secondary | ICD-10-CM

## 2014-09-11 NOTE — Patient Instructions (Signed)
NO CHANGE WITH MEDICATIONS  KEEP UP WITH FOLLOWING YOUR HEART /LOW CHOL DIET  SCHEDULE IN AY 2017 PRIOR TO ANNUAL VISIT    Your physician has requested that you have an echocardiogram. Echocardiography is a painless test that uses sound waves to create images of your heart. It provides your doctor with information about the size and shape of your heart and how well your heart's chambers and valves are working. This procedure takes approximately one hour. There are no restrictions for this procedure.  Your physician wants you to follow-up in 12 months DR HARDING- (F/U ECHO) -30  MIN APPOINTMENT.  You will receive a reminder letter in the mail two months in advance. If you don't receive a letter, please call our office to schedule the follow-up appointment.

## 2014-09-11 NOTE — Progress Notes (Signed)
[name] [name], [id number]   1945-09-07 Date of Service 09/11/2014  PCP: [name], MD  Clinic Note: Chief Complaint  Patient presents with  . Annual Exam    1year exam; no chest pain-had right shoulder pain, shortness of breath-when walking up a hill, has edema in right ankle with pain, occassional pain in legs, frequently has cramping in legs, lightheadedness-some, dizziness-some    HPI: [name] [name] is a 69 y.o. male with a PMH below who presents today for 1 year follow-up of Moderate Non-ischemic Cardiomyopathy. Last seen in October 2015. R Shoulder Rotator Cuff Sgx in March 2016 -- still hurts some, but less each day.  Completed therapy.  OK during day, but hurts more @ night.  Past Medical History  Diagnosis Date  . Arthritis   . Nonischemic cardiomyopathy     09-04-2009  --  2D echo-EF 40-45%, Global Hypokinesis  . Hyperlipemia   . Hypertriglyceridemia   . Coronary atherosclerosis of native coronary vessel     CATH 2004--  MINIMAL NONOBSTRUCTIVE LAD/  NORMAL EF/    CARDIAC CT  03/2008  NO SIGNIFICANT DISEASE  AND  nlef  . History of melanoma excision     2011-- LEFT UPPER BACK  . History of gout     pt 05-20-2013 states stable   . Elevated PSA   . BPH (benign prostatic hypertrophy)   . Personal history of arthritis   . Personal history of other diseases of circulatory system     Prior Cardiac Evaluation/Procedure History: Procedure Laterality Date  . Transthoracic echocardiogram  09-04-2009    MILD LVH/  MILD GLOBAL HYPOKINESIS/ LVEF 40-45%/  MILD AV SCLEROSIS WITHOUT STENOSIS/  MILD PVR  . Cardiovascular stress test  10-05-1998    MILD GLOBAL HYPOKINESIS AND ISCHEMIAIN ANTEROSEPTAL  AT [facility]/ EF 42%  . Cardiac catheterization - Dr. Rex Kras  05/23/2002  &  2006    minimal irregularities in the LAD/  EF 50%    Interval History: He is doing pretty well from a CV standpoint. Walks every day.  Also does physical activity like cutting & hauling firewood, clearing  brush, etc.  He still notes DOE with significant exertion.  Denies any anginal chest pain with rest or exertion. No PND, orthopnea  -- does have some R ankle swelling if active & up on it x ~5-6 hrs. No palpitations, lightheadedness, dizziness, weakness or syncope/near syncope.  He remains reluctant to take any medications. He puts his health and well-being the hands of his faith in [place].  He is fine with taking vitamin supplements, but does not want to take medications as long as he is not having symptoms.  ROS: A comprehensive Review of Systems - was performed Review of Systems  Constitutional: Negative for fever and chills.  Eyes: Negative for double vision.  Respiratory: Negative for hemoptysis, sputum production and wheezing.        A bit hard to breath with congestion & cold symptoms.  Cardiovascular: Negative.  Negative for claudication.       Per HPI  Gastrointestinal: Negative for blood in stool and melena.  Musculoskeletal: Positive for myalgias (legs cramp) and joint pain (R shoulder).       He has pain in the back of his neck where he has a pinched nerve. He is undergoing physical therapy.  Neurological: Negative.  Negative for dizziness (rare posistional dizziness), sensory change, speech change, focal weakness, seizures, loss of consciousness and headaches.  Endo/Heme/Allergies: Negative.   Psychiatric/Behavioral: Negative  for depression. The patient is not nervous/anxious.   All other systems reviewed and are negative.   Current Outpatient Prescriptions on File Prior to Visit  Medication Sig Dispense Refill  . allopurinol (ZYLOPRIM) 100 MG tablet Take 100 mg by mouth as needed.     . Cyanocobalamin (B-12 SL) Place 1 tablet under the tongue daily.    . fish oil-omega-3 fatty acids 1000 MG capsule Take 1 g by mouth daily.    Marland Kitchen gabapentin (NEURONTIN) 300 MG capsule Take 1-2 capsules (300-600 mg total) by mouth 2 (two) times daily as needed. 360 capsule 1  . Garlic TABS Take  1 tablet by mouth daily.    . meloxicam (MOBIC) 15 MG tablet Take 1 tablet (15 mg total) by mouth daily. 30 tablet 0  . Misc Natural Products (SAW PALMETTO) CAPS Take 1 capsule by mouth daily.    . Multiple Vitamins-Minerals (MULTIVITAMIN PO) Take 1 tablet by mouth daily.    . Pomegranate, Punica granatum, (POMEGRANATE PO) Take 1 capsule by mouth daily.    . Red Yeast Rice Extract (RED YEAST RICE PO) Take 1 capsule by mouth daily.    . sildenafil (VIAGRA) 100 MG tablet 1/2 to 1 tablet up to each day as needed. 10 tablet 5  . zoster vaccine live, PF, (ZOSTAVAX) 12458 UNT/0.65ML injection Inject 19,400 Units into the skin once. 1 each 0   No current facility-administered medications on file prior to visit.   Allergies  Allergen Reactions  . Nitroglycerin Anaphylaxis    "Cardiologist suggested he shouldn't take this med because it could kill him with his heart condition. Lowers BP"  . Ace Inhibitors Swelling  . Lovastatin Nausea Only   History  Substance Use Topics  . Smoking status: Former Smoker -- 2.00 packs/day for 20 years    Types: Cigarettes    Quit date: 03/28/1980  . Smokeless tobacco: Never Used  . Alcohol Use: No   Family History  Problem Relation Age of Onset  . Cardiomyopathy      idiopathic. trivial disease 2004 cath, 2010 cardiac CT - no sig. dz, nl LV fxn. cards: Little  . Arthritis Mother   . COPD Father   . Cancer Father     lung cancer  . Cancer Sister   . Colon cancer Neg Hx     Wt Readings from Last 3 Encounters:  09/11/14 94.711 kg (208 lb 12.8 oz)  06/26/14 93.26 kg (205 lb 9.6 oz)  04/28/14 94.802 kg (209 lb)    PHYSICAL EXAM BP 108/62 mmHg  Pulse 92  Ht 5\' 9"  (1.753 m)  Wt 94.711 kg (208 lb 12.8 oz)  BMI 30.82 kg/m2 General appearance: alert, cooperative, appears stated age, no distress and Pleasant mood and affect  Neck: no adenopathy, no carotid bruit, no JVD, supple, symmetrical, trachea midline and thyroid not enlarged, symmetric, no  tenderness/mass/nodules  Lungs: clear to auscultation bilaterally, normal percussion bilaterally and Nonlabored  Heart: regular rate and rhythm, S1, S2 normal, no murmur, click, rub or gallop and normal apical impulse  Abdomen: soft, non-tender; bowel sounds normal; no masses, no organomegaly  Extremities: extremities normal, atraumatic, no cyanosis or edema  Pulses: 2+ and symmetric   Adult ECG Report  Rate: 80 ;  Rhythm: normal sinus rhythm with PVC, ~ IRBBB (stable).  Axis -24, otherwise normal intervals, durations/voltage.  Narrative Interpretation: essentially stable (IRBBB is borderline finding)  Recent Labs:  Reviewed  Lipid Panel     Component Value Date/Time  CHOL 228* 04/14/2014 1558   TRIG 212* 04/14/2014 1558   HDL 40 04/14/2014 1558   CHOLHDL 5.7 04/14/2014 1558   VLDL 42* 04/14/2014 1558   LDLCALC 146* 04/14/2014 1558    ASSESSMENT / PLAN: Overall relatively stable from cardiac standpoint. By his choice not on cardiac medications. Everything being stable as he is, I do not feel pressed to initiate standard therapies.  Intolerant of statin.  Problem List Items Addressed This Visit    Exertional shortness of breath (Chronic)    Stable. Did well on CPET. Not complaining of this - ? If EF has improved.      Relevant Orders   ECHOCARDIOGRAM COMPLETE   Hyperlipidemia with target LDL less than 130 (Chronic)    Being followed by PCP. Was told - labs OK      Relevant Orders   EKG 12-Lead (Completed)   ECHOCARDIOGRAM COMPLETE   Nonischemic cardiomyopathy - Primary (Chronic)    No active HF Sx - has not had evaluation since 2011 -> with lack of Sx, would be nice to get a new baseline @ some point.   Check Echo prior to next visit. With lack of Sx - he is not interested in taking medications.      Relevant Orders   EKG 12-Lead (Completed)   ECHOCARDIOGRAM COMPLETE   Obesity (BMI 30-39.9) (Chronic)    Does exercise & activity, but not eating correctly.  Stable  weight.       Relevant Orders   EKG 12-Lead (Completed)   ECHOCARDIOGRAM COMPLETE       Followup: 1 yr.   Leonie Man, M.D., M.S. Interventional Cardiologist   Pager # [phone]

## 2014-09-11 NOTE — Assessment & Plan Note (Signed)
Does exercise & activity, but not eating correctly.  Stable weight.

## 2014-09-11 NOTE — Assessment & Plan Note (Addendum)
No active HF Sx - has not had evaluation since 2011 -> with lack of Sx, would be nice to get a new baseline @ some point.   Check Echo prior to next visit. With lack of Sx - he is not interested in taking medications.

## 2014-09-11 NOTE — Assessment & Plan Note (Signed)
Stable. Did well on CPET. Not complaining of this - ? If EF has improved.

## 2014-09-11 NOTE — Assessment & Plan Note (Signed)
Being followed by PCP. Was told - labs OK

## 2014-09-13 ENCOUNTER — Encounter: Payer: Self-pay | Admitting: Cardiology

## 2014-10-28 ENCOUNTER — Encounter: Payer: Self-pay | Admitting: Gastroenterology

## 2014-12-09 DIAGNOSIS — R972 Elevated prostate specific antigen [PSA]: Secondary | ICD-10-CM | POA: Diagnosis not present

## 2014-12-15 DIAGNOSIS — N5201 Erectile dysfunction due to arterial insufficiency: Secondary | ICD-10-CM | POA: Diagnosis not present

## 2014-12-15 DIAGNOSIS — R972 Elevated prostate specific antigen [PSA]: Secondary | ICD-10-CM | POA: Diagnosis not present

## 2015-01-23 ENCOUNTER — Ambulatory Visit (INDEPENDENT_AMBULATORY_CARE_PROVIDER_SITE_OTHER): Payer: Medicare Other

## 2015-01-23 DIAGNOSIS — Z23 Encounter for immunization: Secondary | ICD-10-CM

## 2015-02-25 ENCOUNTER — Encounter: Payer: Self-pay | Admitting: Gastroenterology

## 2015-04-01 DIAGNOSIS — Z4789 Encounter for other orthopedic aftercare: Secondary | ICD-10-CM | POA: Diagnosis not present

## 2015-04-01 DIAGNOSIS — M7542 Impingement syndrome of left shoulder: Secondary | ICD-10-CM | POA: Diagnosis not present

## 2015-04-17 DIAGNOSIS — M79641 Pain in right hand: Secondary | ICD-10-CM | POA: Diagnosis not present

## 2015-04-17 DIAGNOSIS — M2550 Pain in unspecified joint: Secondary | ICD-10-CM | POA: Diagnosis not present

## 2015-04-17 DIAGNOSIS — M1A00X0 Idiopathic chronic gout, unspecified site, without tophus (tophi): Secondary | ICD-10-CM | POA: Diagnosis not present

## 2015-04-17 DIAGNOSIS — M170 Bilateral primary osteoarthritis of knee: Secondary | ICD-10-CM | POA: Diagnosis not present

## 2015-04-17 DIAGNOSIS — M19041 Primary osteoarthritis, right hand: Secondary | ICD-10-CM | POA: Diagnosis not present

## 2015-04-21 ENCOUNTER — Ambulatory Visit (AMBULATORY_SURGERY_CENTER): Payer: Self-pay | Admitting: *Deleted

## 2015-04-21 VITALS — Ht 70.0 in | Wt 202.0 lb

## 2015-04-21 DIAGNOSIS — Z86010 Personal history of colonic polyps: Secondary | ICD-10-CM

## 2015-04-21 MED ORDER — NA SULFATE-K SULFATE-MG SULF 17.5-3.13-1.6 GM/177ML PO SOLN: ORAL | Status: DC

## 2015-04-21 NOTE — Progress Notes (Signed)
No allergies to eggs or soy. No problems with anesthesia.  Pt given Emmi instructions for colonoscopy  No oxygen use  No diet drug use  

## 2015-04-27 ENCOUNTER — Encounter: Payer: Self-pay | Admitting: Family Medicine

## 2015-04-27 ENCOUNTER — Ambulatory Visit (INDEPENDENT_AMBULATORY_CARE_PROVIDER_SITE_OTHER): Payer: Medicare Other | Admitting: Family Medicine

## 2015-04-27 VITALS — BP 100/66 | HR 89 | Temp 97.7°F | Resp 16 | Ht 68.5 in | Wt 200.4 lb

## 2015-04-27 DIAGNOSIS — Z0000 Encounter for general adult medical examination without abnormal findings: Secondary | ICD-10-CM

## 2015-04-27 DIAGNOSIS — I428 Other cardiomyopathies: Secondary | ICD-10-CM

## 2015-04-27 DIAGNOSIS — R233 Spontaneous ecchymoses: Secondary | ICD-10-CM

## 2015-04-27 DIAGNOSIS — G588 Other specified mononeuropathies: Secondary | ICD-10-CM | POA: Diagnosis not present

## 2015-04-27 DIAGNOSIS — I429 Cardiomyopathy, unspecified: Secondary | ICD-10-CM | POA: Diagnosis not present

## 2015-04-27 DIAGNOSIS — R238 Other skin changes: Secondary | ICD-10-CM | POA: Diagnosis not present

## 2015-04-27 DIAGNOSIS — Z7982 Long term (current) use of aspirin: Secondary | ICD-10-CM | POA: Diagnosis not present

## 2015-04-27 DIAGNOSIS — E785 Hyperlipidemia, unspecified: Secondary | ICD-10-CM | POA: Diagnosis not present

## 2015-04-27 LAB — POCT CBC
Granulocyte percent: 53.6 %G (ref 37–80)
HCT, POC: 41 % — AB (ref 43.5–53.7)
Hemoglobin: 14.2 g/dL (ref 14.1–18.1)
Lymph, poc: 3.8 — AB (ref 0.6–3.4)
MCH, POC: 30.5 pg (ref 27–31.2)
MCHC: 34.7 g/dL (ref 31.8–35.4)
MCV: 87.7 fL (ref 80–97)
MID (cbc): 0.7 (ref 0–0.9)
MPV: 8.5 fL (ref 0–99.8)
POC Granulocyte: 5.1 (ref 2–6.9)
POC LYMPH PERCENT: 39.5 %L (ref 10–50)
POC MID %: 6.9 %M (ref 0–12)
Platelet Count, POC: 155 10*3/uL (ref 142–424)
RBC: 4.67 M/uL — AB (ref 4.69–6.13)
RDW, POC: 13.7 %
WBC: 9.5 10*3/uL (ref 4.6–10.2)

## 2015-04-27 NOTE — Progress Notes (Signed)
   Subjective:    Patient ID: [name] [name], male    DOB: 13-Dec-1945, 70 y.o.   MRN: [id number]  HPI    Review of Systems  Constitutional: Negative.   HENT: Positive for hearing loss.   Eyes: Negative.   Respiratory: Negative.   Cardiovascular: Negative.   Gastrointestinal: Positive for blood in stool.  Endocrine: Negative.   Genitourinary: Negative.   Musculoskeletal: Positive for back pain and arthralgias.  Skin: Negative.   Allergic/Immunologic: Negative.   Neurological: Positive for headaches.  Hematological: Bruises/bleeds easily.  Psychiatric/Behavioral: Positive for sleep disturbance.       Objective:   Physical Exam        Assessment & Plan:

## 2015-04-27 NOTE — Patient Instructions (Signed)
You should receive a call or letter about your lab results within the next week to 10 days.  Follow up with cardiologist and urologist as planned.   Keeping you healthy  Get these tests  Blood pressure- Have your blood pressure checked once a year by your healthcare provider.  Normal blood pressure is 120/80  Weight- Have your body mass index (BMI) calculated to screen for obesity.  BMI is a measure of body fat based on height and weight. You can also calculate your own BMI at [email or web address].  Cholesterol- Have your cholesterol checked every year.  Diabetes- Have your blood sugar checked regularly if you have high blood pressure, high cholesterol, have a family history of diabetes or if you are overweight.  Screening for Colon Cancer- Colonoscopy starting at age 71.  Screening may begin sooner depending on your family history and other health conditions. Follow up colonoscopy as directed by your Gastroenterologist.  Screening for Prostate Cancer- Both blood work (PSA) and a rectal exam help screen for Prostate Cancer.  Screening begins at age 71 with African-American men and at age 53 with Caucasian men.  Screening may begin sooner depending on your family history.  Take these medicines  Aspirin- One aspirin daily can help prevent Heart disease and Stroke.  Flu shot- Every fall.  Tetanus- Every 10 years.  Zostavax- Once after the age of 71 to prevent Shingles.  Pneumonia shot- Once after the age of 71; if you are younger than 71, ask your healthcare provider if you need a Pneumonia shot.  Take these steps  Don't smoke- If you do smoke, talk to your doctor about quitting.  For tips on how to quit, go to www.smokefree.gov or call 1-800-QUIT-NOW.  Be physically active- Exercise 5 days a week for at least 30 minutes.  If you are not already physically active start slow and gradually work up to 30 minutes of moderate physical activity.  Examples of moderate activity include  walking briskly, mowing the yard, dancing, swimming, bicycling, etc.  Eat a healthy diet- Eat a variety of healthy food such as fruits, vegetables, low fat milk, low fat cheese, yogurt, lean meant, poultry, fish, beans, tofu, etc. For more information go to www.thenutritionsource.org  Drink alcohol in moderation- Limit alcohol intake to less than two drinks a day. Never drink and drive.  Dentist- Brush and floss twice daily; visit your dentist twice a year.  Depression- Your emotional health is as important as your physical health. If you're feeling down, or losing interest in things you would normally enjoy please talk to your healthcare provider.  Eye exam- Visit your eye doctor every year.  Safe sex- If you may be exposed to a sexually transmitted infection, use a condom.  Seat belts- Seat belts can save your life; always wear one.  Smoke/Carbon Monoxide detectors- These detectors need to be installed on the appropriate level of your home.  Replace batteries at least once a year.  Skin cancer- When out in the sun, cover up and use sunscreen 15 SPF or higher.  Violence- If anyone is threatening you, please tell your healthcare provider.  Living Will/ Health care power of attorney- Speak with your healthcare provider and family.

## 2015-04-27 NOTE — Progress Notes (Signed)
Subjective:  By signing my name below, I, [name] [name], attest that this documentation has been prepared under the direction and in the presence of [name], MD. Electronically Signed: Moises [name], [place]. 04/27/2015 , 2:56 PM .  Patient was seen in Room 27 .   Patient ID: [name] [name], male    DOB: [birth year], 70 y.o.   MRN: [id number] Chief Complaint  Patient presents with  . Annual Exam    per patient he does not need refills at this time   HPI [name] [name] is a 70 y.o. male Pt is here for annual medicare wellness exam. His last meal was yesterday 6:00PM.   Cancer Screening Colonoscopy: He has colonoscopy scheduled next Tuesday (May 05, 2015).  Prostate:  H/o Elevated PSA. Followed by Dr. Risa Grill with negative biopsies. He sees Dr. Risa Grill annually.   Immunization Immunization History  Administered Date(s) Administered  . Hepatitis B 01/14/2011  . Influenza Split 01/14/2011, 01/18/2012  . Influenza,inj,Quad PF,36+ Mos 02/04/2013, 12/26/2013, 01/23/2015  . Pneumococcal Conjugate-13 05/06/2014  . Pneumococcal Polysaccharide-23 03/28/2004, 04/29/2009  . Tdap 08/11/2009  . Zoster 06/11/2013   He's up to date.   Depression Depression screen [facility] 2/9 04/27/2015 04/14/2014 11/04/2013  Decreased Interest 0 0 0  Down, Depressed, Hopeless 0 0 0  PHQ - 2 Score 0 0 0   Fall Screening 0 falls in the past year.   Functional status Screening Some difficulty with hearing, otherwise no responses.  Discussed before and declines referral to audiology.   He declines hearing aid because his neighbor has an expensive hearing aid without much help so he is not inclined to having one.   Exercise He walks everyday.   Vision  Visual Acuity Screening   Right eye Left eye Both eyes  Without correction:     With correction: 20/40 20/30 20/25   Last seen a year ago.   Dentist He seems them semi-annually for cleaning.   Advanced Directives He has a living will and is full code.    HLD Lab Results  Component Value Date   CHOL 228* 04/14/2014   HDL 40 04/14/2014   LDLCALC 146* 04/14/2014   TRIG 212* 04/14/2014   CHOLHDL 5.7 04/14/2014   He's on fish oil. Intolerant of statins in the past.   BP He measured BP this morning at 100/60. He denies any symptoms.   Peripheral neuropathy Episodic burning pain in left lower extremity. He takes 1-2 neurontin 300 mg qhs.  He mostly takes 2 at a time if needed. He denies complications.   History of non ischemic cardiomyopathy Followed by Dr. Ellyn Hack, last visit was in June 2016. Plan for echo cardiogram with next visit but this was ordered June 2016. He denies chest pain or hematuria.   Shoulder Pain He has his shoulders done by Dr. Onnie Graham.   Personal This will be his 4 year anniversary with his partner, [name].   Patient Active Problem List   Diagnosis Date Noted  . Hyperlipidemia with target LDL less than 130 09/11/2014  . Rotator cuff tendinitis 04/28/2014  . Obesity (BMI 30-39.9) 12/29/2013  . 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  . Exertional shortness of breath 06/26/2012  . Polyp of colon, adenomatous 04/27/2012  . Elevated PSA 10/20/2011  . Hypertriglyceridemia 07/08/2011  . Nonischemic cardiomyopathy ([place]) 09/04/2009   Past Medical History  Diagnosis Date  . Arthritis   . Nonischemic cardiomyopathy ([place])     09-04-2009  --  2D echo-EF 40-45%, Global Hypokinesis  .  Hyperlipemia   . Hypertriglyceridemia   . Coronary atherosclerosis of native coronary vessel     CATH 2004--  MINIMAL NONOBSTRUCTIVE LAD/  NORMAL EF/    CARDIAC CT  03/2008  NO SIGNIFICANT DISEASE  AND  nlef  . History of melanoma excision     2011-- LEFT UPPER BACK  . History of gout     pt 05-20-2013 states stable   . Elevated PSA   . BPH (benign prostatic hypertrophy)   . Personal history of arthritis   . Personal history of other diseases of circulatory system   . Melanoma ([place]) 2011    back   Past Surgical History    Procedure Laterality Date  . Transthoracic echocardiogram  09-04-2009    MILD LVH/  MILD GLOBAL HYPOKINESIS/ LVEF 40-45%/  MILD AV SCLEROSIS WITHOUT STENOSIS/  MILD PVR  . Cardiovascular stress test  10-05-1998    MILD GLOBAL HYPOKINESIS AND ISCHEMIAIN ANTEROSEPTAL  AT [facility]/ EF 42%  . Lumbar disc surgery  09-12-2000    left  L5 -- S1  . Excision melanoma left upper back  10-14-2009  . Transrectal ultrasound prostate bx  05-23-2005  &  04-19-2001  . Cardiac catheterization  05/23/2002  &  05-23-1998   DR AL LITTLE    minimal irregularities in the LAD/  EF 50%  . Appendectomy  AS CHILD  . Prostate biopsy N/A 05/22/2013    Procedure: BIOPSY TRANSRECTAL ULTRASONIC PROSTATE (TUBP);  Surgeon: [name], MD;  Location: [facility];  Service: Urology;  Laterality: N/A;  . Rotator cuff repair Right 2016   Allergies  Allergen Reactions  . Nitroglycerin Anaphylaxis    "Cardiologist suggested he shouldn't take this med because it could kill him with his heart condition. Lowers BP"  . Ace Inhibitors Swelling  . Lovastatin Nausea Only  . Solarcaine [Benzocaine] Dermatitis   Prior to Admission medications   Medication Sig Start Date End Date Taking? Authorizing Provider  aspirin 325 MG tablet Take 325 mg by mouth daily.    Historical Provider, MD  diazepam (VALIUM) 5 MG tablet Take 0.5 tablets by mouth daily. Reported on 04/21/2015 09/04/14   Historical Provider, MD  fish oil-omega-3 fatty acids 1000 MG capsule Take 1 g by mouth daily.    Historical Provider, MD  gabapentin (NEURONTIN) 300 MG capsule Take 1-2 capsules (300-600 mg total) by mouth 2 (two) times daily as needed. 04/24/14   [name], MD  Garlic TABS Take 1 tablet by mouth daily.    Historical Provider, MD  Magnesium 250 MG TABS Take by mouth daily.    Historical Provider, MD  Melatonin 3 MG TABS Take by mouth as needed. Reported on 04/21/2015    Historical Provider, MD  meloxicam (MOBIC) 15 MG tablet Take 1  tablet (15 mg total) by mouth daily. Patient not taking: Reported on 04/21/2015 06/26/14   Roselee Culver, MD  Misc Natural Products (SAW PALMETTO) CAPS Take 1 capsule by mouth daily.    Historical Provider, MD  Multiple Vitamins-Minerals (MULTIVITAMIN PO) Take 1 tablet by mouth daily.    Historical Provider, MD  Na Sulfate-K Sulfate-Mg Sulf (SUPREP BOWEL PREP) SOLN suprep as directed.  No substitutions 04/21/15   Milus Banister, MD  Naproxen Sodium (ALEVE PO) Take by mouth as needed.    Historical Provider, MD  pyridoxine (B-6) 100 MG tablet Take 100 mg by mouth daily.    Historical Provider, MD  sildenafil (VIAGRA) 100 MG tablet 1/2  to 1 tablet up to each day as needed. 07/08/11   [name], MD  vitamin C (ASCORBIC ACID) 500 MG tablet Take 500 mg by mouth daily.    Historical Provider, MD   Social History   Social History  . Marital Status: Widowed    Spouse Name: N/A  . Number of Children: N/A  . Years of Education: N/A   Occupational History  . Futures trader, Part time     Supervises drywalling an anterior ceiling work.   Social History Main Topics  . Smoking status: Former Smoker -- 2.00 packs/day for 20 years    Types: Cigarettes    Quit date: 03/28/1980  . Smokeless tobacco: Never Used  . Alcohol Use: No  . Drug Use: No  . Sexual Activity: Not on file   Other Topics Concern  . Not on file   Social History Narrative   He walks on a relatively regular basis about 15 minutes at a time mostly to the discomfort. He previously had walked up a 30-40 minutes a time without problems. Education: [employer].   Review of Systems 13 point ROS, positive for occasional [name] in stool, back pain and joint pain, occasional headache, bruising easily (taking full strength aspirin each day)    Objective:   Physical Exam  Constitutional: He is oriented to person, place, and time. He appears well-developed and well-nourished.  HENT:  Head: Normocephalic and atraumatic.   Right Ear: External ear normal.  Left Ear: External ear normal.  Mouth/Throat: Oropharynx is clear and moist.  Eyes: Conjunctivae and EOM are normal. Pupils are equal, round, and reactive to light.  Neck: Normal range of motion. Neck supple. No thyromegaly present.  Cardiovascular: Normal rate, regular rhythm, normal heart sounds and intact distal pulses.   Pulmonary/Chest: Effort normal and breath sounds normal. No respiratory distress. He has no wheezes.  Abdominal: Soft. He exhibits no distension. There is no tenderness.  Musculoskeletal: Normal range of motion. He exhibits no edema or tenderness.  Lymphadenopathy:    He has no cervical adenopathy.  Neurological: He is alert and oriented to person, place, and time. He has normal reflexes.  Skin: Skin is warm and dry.  Psychiatric: He has a normal mood and affect. His behavior is normal.  Vitals reviewed.   Filed Vitals:   04/27/15 1356  BP: 100/66  Pulse: 89  Temp: 97.7 F (36.5 C)  TempSrc: Oral  Resp: 16  Height: 5' 8.5" (1.74 m)  Weight: 200 lb 6.4 oz (90.901 kg)  SpO2: 93%      Assessment & Plan:  [name] [name] is a 70 y.o. male Medicare annual wellness visit, subsequent  --anticipatory guidance as below in AVS, screening labs above. Health maintenance items as above in HPI discussed/recommended as applicable.   Hyperlipidemia - Plan: COMPLETE METABOLIC PANEL WITH GFR, Lipid panel  -Labs pending. Intolerant to statins prior.  Nonischemic cardiomyopathy ([place])  -Continue routine follow-up with cardiology.  Other mononeuropathy  -Stable peripheral neuropathy with intermittent dosing of gabapentin. No changes.  Easy bruising - Plan: POCT CBC Aspirin long-term use - Plan: POCT CBC  -Suspect due to aspirin use and thinning of skin with aging. CBC pending  No orders of the defined types were placed in this encounter.   Patient Instructions  You should receive a call or letter about your lab results within the next  week to 10 days.  Follow up with cardiologist and urologist as planned.   Keeping you healthy  Get these tests  [name] pressure- Have your [name] pressure checked once a year by your healthcare provider.  Normal [name] pressure is 120/80  Weight- Have your body mass index (BMI) calculated to screen for obesity.  BMI is a measure of body fat based on height and weight. You can also calculate your own BMI at ViewBanking.si.  Cholesterol- Have your cholesterol checked every year.  Diabetes- Have your [name] sugar checked regularly if you have high [name] pressure, high cholesterol, have a family history of diabetes or if you are overweight.  Screening for Colon Cancer- Colonoscopy starting at age 31.  Screening may begin sooner depending on your family history and other health conditions. Follow up colonoscopy as directed by your Gastroenterologist.  Screening for Prostate Cancer- Both [name] work (PSA) and a rectal exam help screen for Prostate Cancer.  Screening begins at age 33 with African-American men and at age 53 with Caucasian men.  Screening may begin sooner depending on your family history.  Take these medicines  Aspirin- One aspirin daily can help prevent Heart disease and Stroke.  Flu shot- Every fall.  Tetanus- Every 10 years.  Zostavax- Once after the age of 52 to prevent Shingles.  Pneumonia shot- Once after the age of 30; if you are younger than 58, ask your healthcare provider if you need a Pneumonia shot.  Take these steps  Don't smoke- If you do smoke, talk to your doctor about quitting.  For tips on how to quit, go to www.smokefree.gov or call 1-800-QUIT-NOW.  Be physically active- Exercise 5 days a week for at least 30 minutes.  If you are not already physically active start slow and gradually work up to 30 minutes of moderate physical activity.  Examples of moderate activity include walking briskly, mowing the yard, dancing, swimming, bicycling, etc.  Eat a  healthy diet- Eat a variety of healthy food such as fruits, vegetables, low fat milk, low fat cheese, yogurt, lean meant, poultry, fish, beans, tofu, etc. For more information go to www.thenutritionsource.org  Drink alcohol in moderation- Limit alcohol intake to less than two drinks a day. Never drink and drive.  Dentist- Brush and floss twice daily; visit your dentist twice a year.  Depression- Your emotional health is as important as your physical health. If you're feeling down, or losing interest in things you would normally enjoy please talk to your healthcare provider.  Eye exam- Visit your eye doctor every year.  Safe sex- If you may be exposed to a sexually transmitted infection, use a condom.  Seat belts- Seat belts can save your life; always wear one.  Smoke/Carbon Monoxide detectors- These detectors need to be installed on the appropriate level of your home.  Replace batteries at least once a year.  Skin cancer- When out in the sun, cover up and use sunscreen 15 SPF or higher.  Violence- If anyone is threatening you, please tell your healthcare provider.  Living Will/ Health care power of attorney- Speak with your healthcare provider and family.    I personally performed the services described in this documentation, which was scribed in my presence. The recorded information has been reviewed and considered, and addended by me as needed.

## 2015-04-29 LAB — COMPLETE METABOLIC PANEL WITH GFR
ALT: 25 U/L (ref 9–46)
AST: 21 U/L (ref 10–35)
Albumin: 4.7 g/dL (ref 3.6–5.1)
Alkaline Phosphatase: 50 U/L (ref 40–115)
BUN: 19 mg/dL (ref 7–25)
CO2: 25 mmol/L (ref 20–31)
Calcium: 9.4 mg/dL (ref 8.6–10.3)
Chloride: 104 mmol/L (ref 98–110)
Creat: 1.03 mg/dL (ref 0.70–1.18)
GFR, Est African American: 85 mL/min (ref >=60)
GFR, Est Non African American: 73 mL/min (ref >=60)
Glucose, Bld: 83 mg/dL (ref 65–99)
Potassium: 4.2 mmol/L (ref 3.5–5.3)
Sodium: 140 mmol/L (ref 135–146)
Total Bilirubin: 0.6 mg/dL (ref 0.2–1.2)
Total Protein: 7.3 g/dL (ref 6.1–8.1)

## 2015-04-29 LAB — LIPID PANEL
Cholesterol: 250 mg/dL — ABNORMAL HIGH (ref 125–200)
HDL: 35 mg/dL — ABNORMAL LOW (ref >=40)
LDL Cholesterol: 163 mg/dL — ABNORMAL HIGH (ref <130)
Total CHOL/HDL Ratio: 7.1 Ratio — ABNORMAL HIGH (ref <=5.0)
Triglycerides: 260 mg/dL — ABNORMAL HIGH (ref <150)
VLDL: 52 mg/dL — ABNORMAL HIGH (ref <30)

## 2015-05-04 DIAGNOSIS — G8929 Other chronic pain: Secondary | ICD-10-CM | POA: Diagnosis not present

## 2015-05-04 DIAGNOSIS — M25511 Pain in right shoulder: Secondary | ICD-10-CM | POA: Diagnosis not present

## 2015-05-04 DIAGNOSIS — M7542 Impingement syndrome of left shoulder: Secondary | ICD-10-CM | POA: Diagnosis not present

## 2015-05-05 ENCOUNTER — Encounter: Payer: Self-pay | Admitting: Gastroenterology

## 2015-05-05 ENCOUNTER — Ambulatory Visit (AMBULATORY_SURGERY_CENTER): Payer: Medicare Other | Admitting: Gastroenterology

## 2015-05-05 VITALS — BP 109/68 | HR 75 | Temp 97.0°F | Resp 12 | Ht 70.0 in | Wt 202.0 lb

## 2015-05-05 DIAGNOSIS — Z86010 Personal history of colonic polyps: Secondary | ICD-10-CM | POA: Diagnosis not present

## 2015-05-05 DIAGNOSIS — I429 Cardiomyopathy, unspecified: Secondary | ICD-10-CM | POA: Diagnosis not present

## 2015-05-05 DIAGNOSIS — F419 Anxiety disorder, unspecified: Secondary | ICD-10-CM | POA: Diagnosis not present

## 2015-05-05 DIAGNOSIS — I2510 Atherosclerotic heart disease of native coronary artery without angina pectoris: Secondary | ICD-10-CM | POA: Diagnosis not present

## 2015-05-05 DIAGNOSIS — D125 Benign neoplasm of sigmoid colon: Secondary | ICD-10-CM

## 2015-05-05 DIAGNOSIS — E669 Obesity, unspecified: Secondary | ICD-10-CM | POA: Diagnosis not present

## 2015-05-05 MED ORDER — SODIUM CHLORIDE 0.9 % IV SOLN: 500.0000 mL | INTRAVENOUS | Status: DC

## 2015-05-05 NOTE — Progress Notes (Signed)
No problems noted in the recovery room. maw 

## 2015-05-05 NOTE — Patient Instructions (Signed)
YOU HAD AN ENDOSCOPIC PROCEDURE TODAY AT THE [facility]:   Refer to the procedure report that was given to you for any specific questions about what was found during the examination.  If the procedure report does not answer your questions, please call your gastroenterologist to clarify.  If you requested that your care partner not be given the details of your procedure findings, then the procedure report has been included in a sealed envelope for you to review at your convenience later.  YOU SHOULD EXPECT: Some feelings of bloating in the abdomen. Passage of more gas than usual.  Walking can help get rid of the air that was put into your GI tract during the procedure and reduce the bloating. If you had a lower endoscopy (such as a colonoscopy or flexible sigmoidoscopy) you may notice spotting of blood in your stool or on the toilet paper. If you underwent a bowel prep for your procedure, you may not have a normal bowel movement for a few days.  Please Note:  You might notice some irritation and congestion in your nose or some drainage.  This is from the oxygen used during your procedure.  There is no need for concern and it should clear up in a day or so.  SYMPTOMS TO REPORT IMMEDIATELY:   Following lower endoscopy (colonoscopy or flexible sigmoidoscopy):  Excessive amounts of blood in the stool  Significant tenderness or worsening of abdominal pains  Swelling of the abdomen that is new, acute  Fever of 100F or higher  For urgent or emergent issues, a gastroenterologist can be reached at any hour by calling [phone].   DIET: Your first meal following the procedure should be a small meal and then it is ok to progress to your normal diet. Heavy or fried foods are harder to digest and may make you feel nauseous or bloated.  Likewise, meals heavy in dairy and vegetables can increase bloating.  Drink plenty of fluids but you should avoid alcoholic beverages for 24  hours.  ACTIVITY:  You should plan to take it easy for the rest of today and you should NOT DRIVE or use heavy machinery until tomorrow (because of the sedation medicines used during the test).    FOLLOW UP: Our staff will call the number listed on your records the next business day following your procedure to check on you and address any questions or concerns that you may have regarding the information given to you following your procedure. If we do not reach you, we will leave a message.  However, if you are feeling well and you are not experiencing any problems, there is no need to return our call.  We will assume that you have returned to your regular daily activities without incident.  If any biopsies were taken you will be contacted by phone or by letter within the next 1-3 weeks.  Please call us at [phone] if you have not heard about the biopsies in 3 weeks.    SIGNATURES/CONFIDENTIALITY: You and/or your care partner have signed paperwork which will be entered into your electronic medical record.  These signatures attest to the fact that that the information above on your After Visit Summary has been reviewed and is understood.  Full responsibility of the confidentiality of this discharge information lies with you and/or your care-partner.    Handouts were given to your care partner on polyps, diverticulosis, and a high fiber diet with liberal fluid intake. You may resume your   current medications today. Await biopsy results. Please call if any questions or concerns.   

## 2015-05-05 NOTE — Progress Notes (Signed)
Right before start Dr Lenna Sciara called away for emergency.  Sedation halted and pt allowed to wake up

## 2015-05-05 NOTE — Progress Notes (Signed)
Report to PACU, RN, vss, BBS= Clear.  

## 2015-05-05 NOTE — Op Note (Signed)
[facility]  [address] [place], [address]   COLONOSCOPY PROCEDURE REPORT  PATIENT: [name] [name], [name] [name]  MR#: [id number] BIRTHDATE: 10-08-1945 , 70  yrs. old GENDER: male ENDOSCOPIST: Milus Banister, MD PROCEDURE DATE:  05/05/2015 PROCEDURE:   Colonoscopy, surveillance and Colonoscopy with snare polypectomy First Screening Colonoscopy - Avg.  risk and is 50 yrs.  old or older - No.  Prior Negative Screening - Now for repeat screening. N/A  History of Adenoma - Now for follow-up colonoscopy & has been > or = to 3 yrs.  Yes hx of adenoma.  Has been 3 or more years since last colonoscopy.  Polyps removed today? Yes ASA CLASS:   Class II INDICATIONS:Surveillance due to prior colonic neoplasia and Colonsocopy 2014 Dr.  Ardis Hughs with 3 subCM adenomas. MEDICATIONS: Monitored anesthesia care and Propofol 240 mg IV  DESCRIPTION OF PROCEDURE:   After the risks benefits and alternatives of the procedure were thoroughly explained, informed consent was obtained.  The digital rectal exam revealed no abnormalities of the rectum.   The LB [id number]  endoscope was introduced through the anus and advanced to the cecum, which was identified by both the appendix and ileocecal valve. No adverse events experienced.   The quality of the prep was excellent.  The instrument was then slowly withdrawn as the colon was fully examined. Estimated blood loss is zero unless otherwise noted in this procedure report.  COLON FINDINGS: One polyp was found, removed and sent to pathology. This was sessile, 62mm across, located in sigmoid segment, removed with cold snare.  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  The examination was otherwise normal.  Retroflexed views revealed no abnormalities. The time to cecum = 2.0 Withdrawal time = 6.8   The scope was withdrawn and the procedure completed. COMPLICATIONS: There were no immediate complications.  ENDOSCOPIC IMPRESSION: One  polyp was found, removed and sent to pathology. 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 The examination was otherwise normal  RECOMMENDATIONS: If the polyp(s) removed today are proven to be adenomatous (pre-cancerous) polyps, you will need a repeat colonoscopy in 5 years.  You will receive a letter within 1-2 weeks with the results of your biopsy as well as final recommendations.  Please call my office if you have not received a letter after 3 weeks.  eSigned:  Milus Banister, MD 05/05/2015 10:37 AM   cc: [name], MD

## 2015-05-05 NOTE — Progress Notes (Signed)
Called to room to assist during endoscopic procedure.  Patient ID and intended procedure confirmed with present staff. Received instructions for my participation in the procedure from the performing physician.  

## 2015-05-06 ENCOUNTER — Telehealth: Payer: Self-pay

## 2015-05-06 NOTE — Telephone Encounter (Signed)
  Follow up Call-  Call back number 05/05/2015  Post procedure Call Back phone  # [phone]  Permission to leave phone message Yes     Patient questions:  Do you have a fever, pain , or abdominal swelling? Yes.   Pain Score  4 *  Have you tolerated food without any problems? Yes.    Have you been able to return to your normal activities? Yes.    Do you have any questions about your discharge instructions: Diet   No. Medications  No. Follow up visit  No.  Do you have questions or concerns about your Care? Yes.    Actions: * If pain score is 4 or above: No action needed, pain <4.   Pt c/o back pain "in my kidney area".  He rated pain 3-4/10 now.  Pain in not constant.  He reported pain began Monday night and he said "I could hardly stand the pain".  I advised him since the pain began before the prep was started, to call his primary care if sx do not improve or worsen.  Pt said he would.  Maw

## 2015-05-12 ENCOUNTER — Encounter: Payer: Self-pay | Admitting: Gastroenterology

## 2015-05-14 ENCOUNTER — Telehealth: Payer: Self-pay

## 2015-05-14 NOTE — Telephone Encounter (Signed)
Pt would like lab results from [facility]- 04/27/15 mailed to home address (verified) pt would also like phone consultation regarding results.  [phone] Please call

## 2015-05-17 NOTE — Telephone Encounter (Signed)
See lab notes

## 2015-07-14 DIAGNOSIS — S80861A Insect bite (nonvenomous), right lower leg, initial encounter: Secondary | ICD-10-CM | POA: Diagnosis not present

## 2015-07-14 DIAGNOSIS — L812 Freckles: Secondary | ICD-10-CM | POA: Diagnosis not present

## 2015-07-14 DIAGNOSIS — L821 Other seborrheic keratosis: Secondary | ICD-10-CM | POA: Diagnosis not present

## 2015-07-14 DIAGNOSIS — D225 Melanocytic nevi of trunk: Secondary | ICD-10-CM | POA: Diagnosis not present

## 2015-07-14 DIAGNOSIS — Z85820 Personal history of malignant melanoma of skin: Secondary | ICD-10-CM | POA: Diagnosis not present

## 2015-09-02 ENCOUNTER — Ambulatory Visit (INDEPENDENT_AMBULATORY_CARE_PROVIDER_SITE_OTHER): Payer: Medicare Other | Admitting: Family Medicine

## 2015-09-02 VITALS — BP 128/68 | HR 83 | Temp 97.3°F | Resp 16 | Ht 68.0 in | Wt 205.0 lb

## 2015-09-02 DIAGNOSIS — R5383 Other fatigue: Secondary | ICD-10-CM | POA: Diagnosis not present

## 2015-09-02 DIAGNOSIS — R61 Generalized hyperhidrosis: Secondary | ICD-10-CM

## 2015-09-02 LAB — POCT CBC
Granulocyte percent: 46.9 %G (ref 37–80)
HCT, POC: 41 % — AB (ref 43.5–53.7)
Hemoglobin: 14.4 g/dL (ref 14.1–18.1)
Lymph, poc: 4 — AB (ref 0.6–3.4)
MCH, POC: 30.4 pg (ref 27–31.2)
MCHC: 35.2 g/dL (ref 31.8–35.4)
MCV: 86.4 fL (ref 80–97)
MID (cbc): 0.8 (ref 0–0.9)
MPV: 8.6 fL (ref 0–99.8)
POC Granulocyte: 4.2 (ref 2–6.9)
POC LYMPH PERCENT: 44.4 %L (ref 10–50)
POC MID %: 8.7 %M (ref 0–12)
Platelet Count, POC: 173 10*3/uL (ref 142–424)
RBC: 4.75 M/uL (ref 4.69–6.13)
RDW, POC: 13.4 %
WBC: 9 10*3/uL (ref 4.6–10.2)

## 2015-09-02 MED ORDER — DOXYCYCLINE HYCLATE 100 MG PO TABS: 100.0000 mg | ORAL_TABLET | Freq: Two times a day (BID) | ORAL | Status: DC

## 2015-09-02 NOTE — Progress Notes (Signed)
   HPI  Patient presents today with fatigue and night sweats  Pt describes 3-4 weeks Hx of off and on fatigue and night sweats. He had tick exposure about the same time as onset which developed th eusuall red wheel and resolved as expected without rash.   He also has been having difficult time sleeping. He has mild congestion and dyspnea. States he has momentary chest pains lasting a few seconds X 2 in the last 3 -4 weeks.  He is tolerating food and fluids ike usual. He is not struggling to breath, He is not habving palps or leg swelling.   He is concerned about flu. He states the tick bite area is completely resolved.   PMH: Smoking status noted ROS: Per HPI  Objective: BP 128/68 mmHg  Pulse 83  Temp(Src) 97.3 F (36.3 C) (Oral)  Resp 16  Ht 5\' 8"  (1.727 m)  Wt 205 lb (92.987 kg)  BMI 31.18 kg/m2  SpO2 94% Gen: NAD, alert, cooperative with exam HEENT: NCAT, EOMI, PERRL CV: RRR, good S1/S2, no murmur Resp: CTABL, no wheezes, non-labored Ext: No edema, warm Neuro: Alert and oriented, No gross deficits  Assessment and plan:  # fatigue, night sweats Unclear etiology, given constellation of symptoms and temporal relation to tick bite will cover with doxy Consider EBV as well but I think tickborne illness more likely No signs of worsening NICM RTC with any concerns or failure to improve as expected  Has cardiology f/u in 2 weeks Discussed 5 mg melatonin for sleep   Orders Placed This Encounter  Procedures  . POCT CBC     Kenn File, MD 1:40 PM

## 2015-09-02 NOTE — Patient Instructions (Addendum)
     IF you received an x-ray today, you will receive an invoice from [facility]. Please contact [facility] at [phone] with questions or concerns regarding your invoice.   IF you received labwork today, you will receive an invoice from [employer]. Please contact [facility] at [phone] with questions or concerns regarding your invoice.   Our billing staff will not be able to assist you with questions regarding bills from these companies.  You will be contacted with the lab results as soon as they are available. The fastest way to get your results is to activate your My Chart account. Instructions are located on the last page of this paperwork. If you have not heard from [place] regarding the results in 2 weeks, please contact this office.      Great to meet you!  I am treating you for illness related to your tick bite. Please be sure to finish all of your antibiotics  Please come back if you have any worsening or new symptoms or if you doi not get better as expected.

## 2015-09-14 ENCOUNTER — Encounter: Payer: Self-pay | Admitting: Cardiology

## 2015-09-14 ENCOUNTER — Ambulatory Visit (INDEPENDENT_AMBULATORY_CARE_PROVIDER_SITE_OTHER): Payer: Medicare Other | Admitting: Cardiology

## 2015-09-14 VITALS — BP 129/73 | HR 76 | Ht 68.0 in | Wt 207.8 lb

## 2015-09-14 DIAGNOSIS — R0602 Shortness of breath: Secondary | ICD-10-CM

## 2015-09-14 DIAGNOSIS — E785 Hyperlipidemia, unspecified: Secondary | ICD-10-CM | POA: Diagnosis not present

## 2015-09-14 DIAGNOSIS — M47812 Spondylosis without myelopathy or radiculopathy, cervical region: Secondary | ICD-10-CM

## 2015-09-14 DIAGNOSIS — E781 Pure hyperglyceridemia: Secondary | ICD-10-CM | POA: Diagnosis not present

## 2015-09-14 DIAGNOSIS — I429 Cardiomyopathy, unspecified: Secondary | ICD-10-CM

## 2015-09-14 DIAGNOSIS — I428 Other cardiomyopathies: Secondary | ICD-10-CM

## 2015-09-14 DIAGNOSIS — E669 Obesity, unspecified: Secondary | ICD-10-CM

## 2015-09-14 DIAGNOSIS — G4700 Insomnia, unspecified: Secondary | ICD-10-CM

## 2015-09-14 NOTE — Assessment & Plan Note (Signed)
[place] to continue on his diet and exercise. Hopefully by staying active, this will help him become more sleepy.

## 2015-09-14 NOTE — Assessment & Plan Note (Signed)
No active heart failure symptoms and on no medications. I suspect that this is resolved. He still is taking medications. He never did get a cardiogram done last year, we discussed the pros and cons of rechecking 1 now and he feels fine not rechecking 1. He prefers to go on his symptoms.  For now would continue to monitor. He's not complaining of heart failure symptoms, and therefore does not need any diuretic. By his choice, not on medications.

## 2015-09-14 NOTE — Assessment & Plan Note (Signed)
This poses a problem for any vigorous exercise however.

## 2015-09-14 NOTE — Assessment & Plan Note (Signed)
This seem to be his biggest concern. Of importance of staying active with exercise and hopefully that would make him tired. I also did suggest he gives melatonin little bit more time. But for now I think if the dextromethorphan helps, and/or Benadryl, thinking that may be reasonable at least on an every so often basis used to help with getting sleep.

## 2015-09-14 NOTE — Patient Instructions (Signed)
NO CHANGES WITH CURRENT MEDICATIONS   Your physician wants you to follow-up in 12 MONTHS WITH DR HARDING. You will receive a reminder letter in the mail two months in advance. If you don't receive a letter, please call our office to schedule the follow-up appointment.   If you need a refill on your cardiac medications before your next appointment, please call your pharmacy.  

## 2015-09-14 NOTE — Assessment & Plan Note (Signed)
No longer really a complaint unless he over does it.

## 2015-09-14 NOTE — Progress Notes (Signed)
PCP: [name], MD  Clinic Note: Chief Complaint  Patient presents with  . Insomnia  . Cardiomyopathy     HPI: [name] [name] is a 70 y.o. male with a PMH below who presents today for 1 year follow-up of moderate nonischemic cardiomyopathy.[name] [name] was last seen on June 2016. He was supposed to have an echocardiogram performed, but it was not done. He was doing well, so he thought maybe his EF improved.  Recent Hospitalizations:  Had a Tick bite - being treated. (was unsure if Tick bite or Mono) - no energy / fatigue etc.  Now feeling better - with Doxycycline.   Studies Reviewed: none  Interval History: Doing really well.  Only issue is that he "cannot sleep".  Gets a "surge" that wakes him up when he gets sleepy.  Nyquil helps.  Melatonin does not help. Otherwise, from his cardiac standpoint is been very stable with no active symptoms no heart or symptoms or angina symptoms. He continues to be stable on no real cardiac medications. No bleeding on aspirin. No chest pain or shortness of breath with rest or exertion. No PND, orthopnea or edema. No palpitations, lightheadedness, dizziness, weakness or syncope/near syncope. No TIA/amaurosis fugax symptoms. No claudication.  ROS: A comprehensive was performed. Review of Systems  Constitutional: Positive for malaise/fatigue (improved since taking Doxycycline).  HENT: Negative for nosebleeds.   Eyes: Negative for blurred vision and double vision.  Cardiovascular: Negative.   Gastrointestinal: Negative for blood in stool and melena.  Musculoskeletal: Positive for joint pain (shoulders & neck pain).  Neurological: Negative for headaches.  Endo/Heme/Allergies: Does not bruise/bleed easily.  Psychiatric/Behavioral: The patient has insomnia (Cannot fall asleep).   All other systems reviewed and are negative.   Past Medical History  Diagnosis Date  . Arthritis   . Nonischemic cardiomyopathy ([place])     09-04-2009  --  2D  echo-EF 40-45%, Global Hypokinesis  . Hyperlipemia   . Hypertriglyceridemia   . Coronary atherosclerosis of native coronary vessel     CATH 2004--  MINIMAL NONOBSTRUCTIVE LAD/  NORMAL EF/    CARDIAC CT  03/2008  NO SIGNIFICANT DISEASE  AND  nlef  . History of melanoma excision     2011-- LEFT UPPER BACK  . History of gout     pt 05-20-2013 states stable   . Elevated PSA   . BPH (benign prostatic hypertrophy)   . Personal history of arthritis   . Personal history of other diseases of circulatory system   . Melanoma ([place]) 2011    back    Past Surgical History  Procedure Laterality Date  . Transthoracic echocardiogram  09-04-2009    MILD LVH/  MILD GLOBAL HYPOKINESIS/ LVEF 40-45%/  MILD AV SCLEROSIS WITHOUT STENOSIS/  MILD PVR  . Cardiovascular stress test  10-05-1998    MILD GLOBAL HYPOKINESIS AND ISCHEMIAIN ANTEROSEPTAL  AT [facility]/ EF 42%  . Lumbar disc surgery  09-12-2000    left  L5 -- S1  . Excision melanoma left upper back  10-14-2009  . Transrectal ultrasound prostate bx  05-23-2005  &  04-19-2001  . Cardiac catheterization  05/23/2002  &  05-23-1998   DR AL LITTLE    minimal irregularities in the LAD/  EF 50%  . Appendectomy  AS CHILD  . Prostate biopsy N/A 05/22/2013    Procedure: BIOPSY TRANSRECTAL ULTRASONIC PROSTATE (TUBP);  Surgeon: [name], MD;  Location: [facility];  Service: Urology;  Laterality:  N/A;  . Rotator cuff repair Right 2016   Prior to Admission medications   Medication Sig Start Date End Date Taking? Authorizing Provider  aspirin 325 MG tablet Take 325 mg by mouth daily.   Yes Historical Provider, MD  doxycycline (VIBRA-TABS) 100 MG tablet Take 1 tablet (100 mg total) by mouth 2 (two) times daily. 1 po bid 09/02/15  Yes Timmothy Euler, MD  fish oil-omega-3 fatty acids 1000 MG capsule Take 1 g by mouth daily.   Yes Historical Provider, MD  gabapentin (NEURONTIN) 300 MG capsule Take 1-2 capsules (300-600 mg total) by mouth 2 (two) times  daily as needed. 04/24/14  Yes [name], MD  Garlic TABS Take 1 tablet by mouth daily.   Yes Historical Provider, MD  Magnesium 250 MG TABS Take by mouth daily.   Yes Historical Provider, MD  meloxicam (MOBIC) 15 MG tablet Take 1 tablet (15 mg total) by mouth daily. 06/26/14  Yes Roselee Culver, MD  Multiple Vitamins-Minerals (MULTIVITAMIN PO) Take 1 tablet by mouth daily.   Yes Historical Provider, MD  Naproxen Sodium (ALEVE PO) Take by mouth as needed. Reported on 05/05/2015   Yes Historical Provider, MD  pyridoxine (B-6) 100 MG tablet Take 100 mg by mouth daily.   Yes Historical Provider, MD  sildenafil (VIAGRA) 100 MG tablet 1/2 to 1 tablet up to each day as needed. 07/08/11  Yes [name], MD  Turmeric Curcumin 500 MG CAPS Take 1 tablet by mouth daily.   Yes Historical Provider, MD  vitamin C (ASCORBIC ACID) 500 MG tablet Take 500 mg by mouth daily.   Yes Historical Provider, MD   Allergies  Allergen Reactions  . Nitroglycerin Anaphylaxis    "Cardiologist suggested he shouldn't take this med because it could kill him with his heart condition. Lowers BP"  . Ace Inhibitors Swelling  . Lovastatin Nausea Only  . Solarcaine [Benzocaine] Dermatitis     Social History   Social History  . Marital Status: Widowed    Spouse Name: N/A  . Number of Children: N/A  . Years of Education: N/A   Occupational History  . Futures trader, Part time     Supervises drywalling an anterior ceiling work.   Social History Main Topics  . Smoking status: Former Smoker -- 2.00 packs/day for 20 years    Types: Cigarettes    Quit date: 03/28/1980  . Smokeless tobacco: Never Used  . Alcohol Use: No  . Drug Use: No  . Sexual Activity: Not Asked   Other Topics Concern  . None   Social History Narrative   He walks on a relatively regular basis about 15 minutes at a time mostly to the discomfort. He previously had walked up a 30-40 minutes a time without problems. Education: [employer].   Family History  Problem Relation Age of Onset  . Cardiomyopathy      idiopathic. trivial disease 2004 cath, 2010 cardiac CT - no sig. dz, nl LV fxn. cards: Little  . Arthritis Mother   . COPD Father   . Cancer Father     lung cancer  . Cancer Sister   . Colon cancer Neg Hx      Wt Readings from Last 3 Encounters:  09/14/15 207 lb 12.8 oz (94.257 kg)  09/02/15 205 lb (92.987 kg)  05/05/15 202 lb (91.627 kg)    PHYSICAL EXAM BP 129/73 mmHg  Pulse 76  Ht 5\' 8"  (1.727 m)  Wt 207  lb 12.8 oz (94.257 kg)  BMI 31.60 kg/m2 General appearance: alert, cooperative, appears stated age, no distress and Pleasant mood and affect  Neck: no adenopathy, no carotid bruit, no JVD, supple, symmetrical, trachea midline and thyroid not enlarged, symmetric, no tenderness/mass/nodules  Lungs: clear to auscultation bilaterally, normal percussion bilaterally and Nonlabored  Heart: regular rate and rhythm, S &, S2 normal, no murmur, click, rub or gallop and normal apical impulse  Abdomen: soft, non-tender; bowel sounds normal; no masses, no organomegaly  Extremities: extremities normal, atraumatic, no cyanosis or edema  Pulses: 2+ and symmetric    Adult ECG Report  Rate: 76 ;  Rhythm: normal sinus rhythm and normal axis, intervals, durations;   Narrative Interpretation: normal   Other studies Reviewed: Additional studies/ records that were reviewed today include:  Recent Labs:  Check by PCP   ASSESSMENT / PLAN: Problem List Items Addressed This Visit    Osteoarthritis of cervical spine    This poses a problem for any vigorous exercise however.      Obesity (BMI 30-39.9) (Chronic)    Incursion to continue on his diet and exercise. Hopefully by staying active, this will help him become more sleepy.      Nonischemic cardiomyopathy (HCC) - Primary (Chronic)    No active heart failure symptoms and on no medications. I suspect that this is resolved. He still is taking  medications. He never did get a cardiogram done last year, we discussed the pros and cons of rechecking 1 now and he feels fine not rechecking 1. He prefers to go on his symptoms.  For now would continue to monitor. He's not complaining of heart failure symptoms, and therefore does not need any diuretic. By his choice, not on medications.      Relevant Orders   EKG 12-Lead   Insomnia (Chronic)    This seem to be his biggest concern. Of importance of staying active with exercise and hopefully that would make him tired. I also did suggest he gives melatonin little bit more time. But for now I think if the dextromethorphan helps, and/or Benadryl, thinking that may be reasonable at least on an every so often basis used to help with getting sleep.      Hypertriglyceridemia (Chronic)   Relevant Orders   EKG 12-Lead   Hyperlipidemia with target LDL less than 130 (Chronic)    Being monitored by PCP. He is on fish oil but not on any other medications.      Relevant Orders   EKG 12-Lead   Exertional shortness of breath (Chronic)    No longer really a complaint unless he over does it.         Current medicines are reviewed at length with the patient today. (+/- concerns) Asked about sleep aids. NyQuil which is next with warfarin and Tylenol seem to help. The following changes have been made: Suggested alternating fixed with ORIF in [place] Benadryl. Could also consider other medications such as Ambien but would recommend discussing with his PCP. Studies Ordered:   Orders Placed This Encounter  Procedures  . EKG 12-Lead   No change medications. Follow-up in one year.   Glenetta Hew, M.D., M.S. Interventional Cardiologist   Pager # [phone] Phone # [phone] [address] [place] [place], [place] [address]

## 2015-09-14 NOTE — Assessment & Plan Note (Signed)
Being monitored by PCP. He is on fish oil but not on any other medications.

## 2015-10-21 ENCOUNTER — Ambulatory Visit: Payer: Medicare Other | Admitting: Family Medicine

## 2015-10-22 ENCOUNTER — Ambulatory Visit (INDEPENDENT_AMBULATORY_CARE_PROVIDER_SITE_OTHER): Payer: Medicare Other | Admitting: Family Medicine

## 2015-10-22 ENCOUNTER — Encounter: Payer: Self-pay | Admitting: Family Medicine

## 2015-10-22 VITALS — BP 122/76 | HR 87 | Temp 98.6°F | Resp 18 | Ht 68.0 in | Wt 206.0 lb

## 2015-10-22 DIAGNOSIS — E785 Hyperlipidemia, unspecified: Secondary | ICD-10-CM | POA: Diagnosis not present

## 2015-10-22 DIAGNOSIS — R252 Cramp and spasm: Secondary | ICD-10-CM

## 2015-10-22 DIAGNOSIS — G4700 Insomnia, unspecified: Secondary | ICD-10-CM

## 2015-10-22 DIAGNOSIS — G6289 Other specified polyneuropathies: Secondary | ICD-10-CM

## 2015-10-22 MED ORDER — GABAPENTIN 300 MG PO CAPS: 300.0000 mg | ORAL_CAPSULE | Freq: Two times a day (BID) | ORAL | 1 refills | Status: DC | PRN

## 2015-10-22 NOTE — Patient Instructions (Addendum)
Please return within the next 1 week for fasting lab only visit.  For your sleep, continue melatonin and Benadryl as needed at night, but work on sleep hygiene as we discussed, including sleeping in your bed initially and not watching TV. See other information on handout and below. If this does not help, follow-up with me in the next month to discuss other testing or treatment options.  If your cholesterol is still elevated, I would like to try a low-dose of a different cholesterol medication to see if you tolerate it. We can always increase the dose if you do okay on that medication. I will wait until we receive the results to prescribe it. If you do start a cholesterol medicine in and the change or worsening of your muscle cramps, return to discuss other options and recheck a muscle enzyme test.  Okay to continue gabapentin for nerve pain, make sure you're drinking plenty of fluid when working outside. I will check other electrolytes and test for the muscle cramps, but as above, if the symptoms are worsening, return for recheck.  Return to the clinic or go to the nearest emergency room if any of your symptoms worsen or new symptoms occur.   Insomnia Insomnia is a sleep disorder that makes it difficult to fall asleep or to stay asleep. Insomnia can cause tiredness (fatigue), low energy, difficulty concentrating, mood swings, and poor performance at work or school.  There are three different ways to classify insomnia:  Difficulty falling asleep.  Difficulty staying asleep.  Waking up too early in the morning. Any type of insomnia can be long-term (chronic) or short-term (acute). Both are common. Short-term insomnia usually lasts for three months or less. Chronic insomnia occurs at least three times a week for longer than three months. CAUSES  Insomnia may be caused by another condition, situation, or substance, such as:  Anxiety.  Certain medicines.  Gastroesophageal reflux disease  (GERD) or other gastrointestinal conditions.  Asthma or other breathing conditions.  Restless legs syndrome, sleep apnea, or other sleep disorders.  Chronic pain.  Menopause. This may include hot flashes.  Stroke.  Abuse of alcohol, tobacco, or illegal drugs.  Depression.  Caffeine.   Neurological disorders, such as Alzheimer disease.  An overactive thyroid (hyperthyroidism). The cause of insomnia may not be known. RISK FACTORS Risk factors for insomnia include:  Gender. Women are more commonly affected than men.  Age. Insomnia is more common as you get older.  Stress. This may involve your professional or personal life.  Income. Insomnia is more common in people with lower income.  Lack of exercise.   Irregular work schedule or night shifts.  Traveling between different time zones. SIGNS AND SYMPTOMS If you have insomnia, trouble falling asleep or trouble staying asleep is the main symptom. This may lead to other symptoms, such as:  Feeling fatigued.  Feeling nervous about going to sleep.  Not feeling rested in the morning.  Having trouble concentrating.  Feeling irritable, anxious, or depressed. TREATMENT  Treatment for insomnia depends on the cause. If your insomnia is caused by an underlying condition, treatment will focus on addressing the condition. Treatment may also include:   Medicines to help you sleep.  Counseling or therapy.  Lifestyle adjustments. HOME CARE INSTRUCTIONS   Take medicines only as directed by your health care provider.  Keep regular sleeping and waking hours. Avoid naps.  Keep a sleep diary to help you and your health care provider figure out what could be causing  your insomnia. Include:   When you sleep.  When you wake up during the night.  How well you sleep.   How rested you feel the next day.  Any side effects of medicines you are taking.  What you eat and drink.   Make your bedroom a comfortable place  where it is easy to fall asleep:  Put up shades or special blackout curtains to block light from outside.  Use a white noise machine to block noise.  Keep the temperature cool.   Exercise regularly as directed by your health care provider. Avoid exercising right before bedtime.  Use relaxation techniques to manage stress. Ask your health care provider to suggest some techniques that may work well for you. These may include:  Breathing exercises.  Routines to release muscle tension.  Visualizing peaceful scenes.  Cut back on alcohol, caffeinated beverages, and cigarettes, especially close to bedtime. These can disrupt your sleep.  Do not overeat or eat spicy foods right before bedtime. This can lead to digestive discomfort that can make it hard for you to sleep.  Limit screen use before bedtime. This includes:  Watching TV.  Using your smartphone, tablet, and computer.  Stick to a routine. This can help you fall asleep faster. Try to do a quiet activity, brush your teeth, and go to bed at the same time each night.  Get out of bed if you are still awake after 15 minutes of trying to sleep. Keep the lights down, but try reading or doing a quiet activity. When you feel sleepy, go back to bed.  Make sure that you drive carefully. Avoid driving if you feel very sleepy.  Keep all follow-up appointments as directed by your health care provider. This is important. SEEK MEDICAL CARE IF:   You are tired throughout the day or have trouble in your daily routine due to sleepiness.  You continue to have sleep problems or your sleep problems get worse. SEEK IMMEDIATE MEDICAL CARE IF:   You have serious thoughts about hurting yourself or someone else.   This information is not intended to replace advice given to you by your health care provider. Make sure you discuss any questions you have with your health care provider.   Document Released: 03/11/2000 Document Revised: 12/03/2014  Document Reviewed: 12/13/2013 Elsevier Interactive Patient Education 2016 [employer].     IF you received an x-ray today, you will receive an invoice from [facility]. Please contact [facility] at [phone] with questions or concerns regarding your invoice.   IF you received labwork today, you will receive an invoice from [employer]. Please contact [facility] at [phone] with questions or concerns regarding your invoice.   Our billing staff will not be able to assist you with questions regarding bills from these companies.  You will be contacted with the lab results as soon as they are available. The fastest way to get your results is to activate your My Chart account. Instructions are located on the last page of this paperwork. If you have not heard from [place] regarding the results in 2 weeks, please contact this office.

## 2015-10-22 NOTE — Progress Notes (Signed)
By signing my name below, I, Mesha Guinyard, attest that this documentation has been prepared under the direction and in the presence of [name], MD.  Electronically Signed: Verlee Monte, Medical Scribe. 10/22/15. 10:52 AM.  Subjective:    Patient ID: [name] [name], male    DOB: 1946-02-09, 70 y.o.   MRN: [id number]  HPI Chief Complaint  Patient presents with   Follow-up    6 month/ patient was unsure exactly why he is here    HPI Comments: [name] [name] is a 70 y.o. male with a PMHx of HLD, and hypertriglyceridemia who presents to the [facility] for follow-up. Pt states he's been doing well and splitting wood all July. Pt had a breakfast bar this morning before he took his medication.  Chronic Muscle Cramps: Pt mentions he has had muscle cramps in both legs for a long time that occur when he's active. Pt reports pain in his left leg when it cramps. Pt has to take Gabapentin every 6 hours when he works due to the cramping. Pt states on a nl day, he doesn't have to take Gabapentin. Pt would like to get a refill on Gabapentin- takes it PRN.   HLD: He was unable to tolerate statin in the past (pt thinks he's only tried Lovastatin, dizziness was his side affect), so he's been taking OTC fish oil. He was seen by cardiology June 19th with hx of no ischemic cardiomyopathy. He was stable at that visit. No active heart failure symptoms; plan on monitoring for symptoms and no medications. Pt denies chest pain. Lab Results  Component Value Date   CHOL 250 (H) 04/27/2015   HDL 35 (L) 04/27/2015   LDLCALC 163 (H) 04/27/2015   TRIG 260 (H) 04/27/2015   CHOLHDL 7.1 (H) 04/27/2015   Lab Results  Component Value Date   ALT 25 04/27/2015   AST 21 04/27/2015   ALKPHOS 50 04/27/2015   BILITOT 0.6 04/27/2015   Insomnia: Had insomnia been going on for years. See prior visits. Was treated for possible tick born illness by Dr. Wendi Snipes in June with doxycycline. Insomnia  discussed with cardiology visit. Planned for melatonin, increasing activity and exercise and Benadryl PRN. Pt states his insomnia has been getting worse becuase he has to take something to go to sleep. Pt normally watches TV before sleep. Pt took Benadryl around 9pm and woke up around 11pm with the TV on. He quickly went to bed and just layed in bed. Pt states he been getting exercise through his   Patient Active Problem List   Diagnosis Date Noted   Insomnia 09/14/2015   Hyperlipidemia with target LDL less than 130 09/11/2014   Rotator cuff tendinitis 04/28/2014   Obesity (BMI 30-39.9) 12/29/2013   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   Exertional shortness of breath 06/26/2012   Polyp of colon, adenomatous 04/27/2012   Elevated PSA 10/20/2011   Hypertriglyceridemia 07/08/2011   Nonischemic cardiomyopathy ([place]) 09/04/2009   Past Medical History:  Diagnosis Date   Arthritis    BPH (benign prostatic hypertrophy)    Coronary atherosclerosis of native coronary vessel    CATH 2004--  MINIMAL NONOBSTRUCTIVE LAD/  NORMAL EF/    CARDIAC CT  03/2008  NO SIGNIFICANT DISEASE  AND  nlef   Elevated PSA    History of gout    pt 05-20-2013 states stable    History of melanoma excision    2011-- LEFT UPPER BACK   Hyperlipemia  Hypertriglyceridemia    Melanoma ([place]) 2011   back   Nonischemic cardiomyopathy ([place])    09-04-2009  --  2D echo-EF 40-45%, Global Hypokinesis   Personal history of arthritis    Personal history of other diseases of circulatory system    Past Surgical History:  Procedure Laterality Date   APPENDECTOMY  AS CHILD   CARDIAC CATHETERIZATION  05/23/2002  &  05-23-1998   DR AL LITTLE   minimal irregularities in the LAD/  EF 50%   CARDIOVASCULAR STRESS TEST  10-05-1998   MILD GLOBAL HYPOKINESIS AND ISCHEMIAIN ANTEROSEPTAL  AT [facility]/ EF 42%   EXCISION MELANOMA LEFT UPPER BACK  10-14-2009   LUMBAR [place] SURGERY  09-12-2000   left  L5 -- S1     PROSTATE BIOPSY N/A 05/22/2013   Procedure: BIOPSY TRANSRECTAL ULTRASONIC PROSTATE (TUBP);  Surgeon: [name], MD;  Location: [facility];  Service: Urology;  Laterality: N/A;   ROTATOR CUFF REPAIR Right 2016   TRANSRECTAL ULTRASOUND PROSTATE BX  05-23-2005  &  04-19-2001   TRANSTHORACIC ECHOCARDIOGRAM  09-04-2009   MILD LVH/  MILD GLOBAL HYPOKINESIS/ LVEF 40-45%/  MILD AV SCLEROSIS WITHOUT STENOSIS/  MILD PVR   Allergies  Allergen Reactions   Nitroglycerin Anaphylaxis    "Cardiologist suggested he shouldn't take this med because it could kill him with his heart condition. Lowers BP"   Ace Inhibitors Swelling   Lovastatin Nausea Only   Solarcaine [Benzocaine] Dermatitis   Prior to Admission medications   Medication Sig Start Date End Date Taking? Authorizing Provider  aspirin 325 MG tablet Take 325 mg by mouth daily.   Yes Historical Provider, MD  fish oil-omega-3 fatty acids 1000 MG capsule Take 1 g by mouth daily.   Yes Historical Provider, MD  gabapentin (NEURONTIN) 300 MG capsule Take 1-2 capsules (300-600 mg total) by mouth 2 (two) times daily as needed. 04/24/14  Yes [name], MD  Garlic TABS Take 1 tablet by mouth daily.   Yes Historical Provider, MD  Magnesium 250 MG TABS Take by mouth daily.   Yes Historical Provider, MD  Multiple Vitamins-Minerals (MULTIVITAMIN PO) Take 1 tablet by mouth daily.   Yes Historical Provider, MD  Naproxen Sodium (ALEVE PO) Take by mouth as needed. Reported on 05/05/2015   Yes Historical Provider, MD  pyridoxine (B-6) 100 MG tablet Take 100 mg by mouth daily.   Yes Historical Provider, MD  sildenafil (VIAGRA) 100 MG tablet 1/2 to 1 tablet up to each day as needed. 07/08/11  Yes [name], MD  Turmeric Curcumin 500 MG CAPS Take 1 tablet by mouth daily.   Yes Historical Provider, MD  vitamin C (ASCORBIC ACID) 500 MG tablet Take 500 mg by mouth daily.   Yes Historical Provider, MD  doxycycline (VIBRA-TABS) 100 MG  tablet Take 1 tablet (100 mg total) by mouth 2 (two) times daily. 1 po bid Patient not taking: Reported on 10/22/2015 09/02/15   Timmothy Euler, MD  meloxicam (MOBIC) 15 MG tablet Take 1 tablet (15 mg total) by mouth daily. Patient not taking: Reported on 10/22/2015 06/26/14   Roselee Culver, MD   Social History   Social History   Marital status: Widowed    Spouse name: N/A   Number of children: N/A   Years of education: N/A   Occupational History   Futures trader, Part time     Supervises drywalling an anterior ceiling work.   Social History Main Topics  Smoking status: Former Smoker    Packs/day: 2.00    Years: 20.00    Types: Cigarettes    Quit date: 03/28/1980   Smokeless tobacco: Never Used   Alcohol use No   Drug use: No   Sexual activity: Not on file   Other Topics Concern   Not on file   Social History Narrative   He walks on a relatively regular basis about 15 minutes at a time mostly to the discomfort. He previously had walked up a 30-40 minutes a time without problems. Education: [employer].   Review of Systems  Cardiovascular: Negative for chest pain.  Musculoskeletal: Positive for myalgias (cramping).  Psychiatric/Behavioral: Positive for sleep disturbance.   Objective:  Physical Exam  Constitutional: He is oriented to person, place, and time. He appears well-developed and well-nourished.  HENT:  Head: Normocephalic and atraumatic.  Eyes: EOM are normal. Pupils are equal, round, and reactive to light.  Neck: No JVD present. Carotid bruit is not present.  Cardiovascular: Normal rate, regular rhythm and normal heart sounds.  Exam reveals no gallop and no friction rub.   No murmur heard. Pulmonary/Chest: Effort normal and breath sounds normal. No respiratory distress. He has no wheezes. He has no rales.  Musculoskeletal: He exhibits no edema.  Neurological: He is alert and oriented to person, place, and time.  Skin: Skin is warm and  dry.  Psychiatric: He has a normal mood and affect.  Vitals reviewed.  BP 122/76    Pulse 87    Temp 98.6 F (37 C) (Oral)    Resp 18    Ht 5\' 8"  (1.727 m)    Wt 206 lb (93.4 kg)    SpO2 95%    BMI 31.32 kg/m  Assessment & Plan:   [name] [name] is a 70 y.o. male Muscle cramps - Plan: CK, CANCELED: CK Other polyneuropathy ([place]) - Plan: gabapentin (NEURONTIN) 300 MG capsule, CK, CANCELED: CK  - Intermittent, stable lower externally cramps/neuropathy with gabapentin. No change in dose for now. Will repeat CPK.  Insomnia  - Sleep hygiene and over-the-counter treatments discussed. Could consider short course of Ambien or Restoril to establish normal sleep pattern. This was deferred at present. RTC precautions if persistent.  Hyperlipidemia - Plan: COMPLETE METABOLIC PANEL WITH GFR, Lipid panel, CANCELED: COMPLETE METABOLIC PANEL WITH GFR, CANCELED: Lipid panel  - Repeat lipid panel. Would consider other statin low-dose, and watching for signs of myopathy or myalgias. Will look at labs first.  Meds ordered this encounter  Medications   gabapentin (NEURONTIN) 300 MG capsule    Sig: Take 1-2 capsules (300-600 mg total) by mouth 2 (two) times daily as needed.    Dispense:  360 capsule    Refill:  1   Patient Instructions   Please return within the next 1 week for fasting lab only visit.  For your sleep, continue melatonin and Benadryl as needed at night, but work on sleep hygiene as we discussed, including sleeping in your bed initially and not watching TV. See other information on handout and below. If this does not help, follow-up with me in the next month to discuss other testing or treatment options.  If your cholesterol is still elevated, I would like to try a low-dose of a different cholesterol medication to see if you tolerate it. We can always increase the dose if you do okay on that medication. I will wait until we receive the results to prescribe it. If you do start a  cholesterol  medicine in and the change or worsening of your muscle cramps, return to discuss other options and recheck a muscle enzyme test.  Okay to continue gabapentin for nerve pain, make sure you're drinking plenty of fluid when working outside. I will check other electrolytes and test for the muscle cramps, but as above, if the symptoms are worsening, return for recheck.  Return to the clinic or go to the nearest emergency room if any of your symptoms worsen or new symptoms occur.   Insomnia Insomnia is a sleep disorder that makes it difficult to fall asleep or to stay asleep. Insomnia can cause tiredness (fatigue), low energy, difficulty concentrating, mood swings, and poor performance at work or school.  There are three different ways to classify insomnia:  Difficulty falling asleep.  Difficulty staying asleep.  Waking up too early in the morning. Any type of insomnia can be long-term (chronic) or short-term (acute). Both are common. Short-term insomnia usually lasts for three months or less. Chronic insomnia occurs at least three times a week for longer than three months. CAUSES  Insomnia may be caused by another condition, situation, or substance, such as:  Anxiety.  Certain medicines.  Gastroesophageal reflux disease (GERD) or other gastrointestinal conditions.  Asthma or other breathing conditions.  Restless legs syndrome, sleep apnea, or other sleep disorders.  Chronic pain.  Menopause. This may include hot flashes.  Stroke.  Abuse of alcohol, tobacco, or illegal drugs.  Depression.  Caffeine.   Neurological disorders, such as Alzheimer disease.  An overactive thyroid (hyperthyroidism). The cause of insomnia may not be known. RISK FACTORS Risk factors for insomnia include:  Gender. Women are more commonly affected than men.  Age. Insomnia is more common as you get older.  Stress. This may involve your professional or personal life.  Income. Insomnia is more  common in people with lower income.  Lack of exercise.   Irregular work schedule or night shifts.  Traveling between different time zones. SIGNS AND SYMPTOMS If you have insomnia, trouble falling asleep or trouble staying asleep is the main symptom. This may lead to other symptoms, such as:  Feeling fatigued.  Feeling nervous about going to sleep.  Not feeling rested in the morning.  Having trouble concentrating.  Feeling irritable, anxious, or depressed. TREATMENT  Treatment for insomnia depends on the cause. If your insomnia is caused by an underlying condition, treatment will focus on addressing the condition. Treatment may also include:   Medicines to help you sleep.  Counseling or therapy.  Lifestyle adjustments. HOME CARE INSTRUCTIONS   Take medicines only as directed by your health care provider.  Keep regular sleeping and waking hours. Avoid naps.  Keep a sleep diary to help you and your health care provider figure out what could be causing your insomnia. Include:   When you sleep.  When you wake up during the night.  How well you sleep.   How rested you feel the next day.  Any side effects of medicines you are taking.  What you eat and drink.   Make your bedroom a comfortable place where it is easy to fall asleep:  Put up shades or special blackout curtains to block light from outside.  Use a white noise machine to block noise.  Keep the temperature cool.   Exercise regularly as directed by your health care provider. Avoid exercising right before bedtime.  Use relaxation techniques to manage stress. Ask your health care provider to suggest some techniques that may  work well for you. These may include:  Breathing exercises.  Routines to release muscle tension.  Visualizing peaceful scenes.  Cut back on alcohol, caffeinated beverages, and cigarettes, especially close to bedtime. These can disrupt your sleep.  Do not overeat or eat spicy  foods right before bedtime. This can lead to digestive discomfort that can make it hard for you to sleep.  Limit screen use before bedtime. This includes:  Watching TV.  Using your smartphone, tablet, and computer.  Stick to a routine. This can help you fall asleep faster. Try to do a quiet activity, brush your teeth, and go to bed at the same time each night.  Get out of bed if you are still awake after 15 minutes of trying to sleep. Keep the lights down, but try reading or doing a quiet activity. When you feel sleepy, go back to bed.  Make sure that you drive carefully. Avoid driving if you feel very sleepy.  Keep all follow-up appointments as directed by your health care provider. This is important. SEEK MEDICAL CARE IF:   You are tired throughout the day or have trouble in your daily routine due to sleepiness.  You continue to have sleep problems or your sleep problems get worse. SEEK IMMEDIATE MEDICAL CARE IF:   You have serious thoughts about hurting yourself or someone else.   This information is not intended to replace advice given to you by your health care provider. Make sure you discuss any questions you have with your health care provider.   Document Released: 03/11/2000 Document Revised: 12/03/2014 Document Reviewed: 12/13/2013 Elsevier Interactive Patient Education 2016 [employer].     IF you received an x-ray today, you will receive an invoice from [facility]. Please contact [facility] at [phone] with questions or concerns regarding your invoice.   IF you received labwork today, you will receive an invoice from [facility]. Please contact [facility] at [phone] with questions or concerns regarding your invoice.   Our billing staff will not be able to assist you with questions regarding bills from these companies.  You will be contacted with the lab results as soon as they are available. The fastest way to get  your results is to activate your My Chart account. Instructions are located on the last page of this paperwork. If you have not heard from [place] regarding the results in 2 weeks, please contact this office.       I personally performed the services described in this documentation, which was scribed in my presence. The recorded information has been reviewed and considered, and addended by me as needed.   Signed,   [name], MD [facility].  10/23/15 12:20 PM

## 2015-10-26 ENCOUNTER — Encounter (INDEPENDENT_AMBULATORY_CARE_PROVIDER_SITE_OTHER): Payer: Medicare Other | Admitting: Family Medicine

## 2015-10-26 DIAGNOSIS — G6289 Other specified polyneuropathies: Secondary | ICD-10-CM

## 2015-10-26 DIAGNOSIS — R252 Cramp and spasm: Secondary | ICD-10-CM

## 2015-10-26 LAB — COMPLETE METABOLIC PANEL WITH GFR
ALT: 22 U/L (ref 9–46)
AST: 16 U/L (ref 10–35)
Albumin: 4.4 g/dL (ref 3.6–5.1)
Alkaline Phosphatase: 50 U/L (ref 40–115)
BUN: 23 mg/dL (ref 7–25)
CO2: 28 mmol/L (ref 20–31)
Calcium: 9.4 mg/dL (ref 8.6–10.3)
Chloride: 105 mmol/L (ref 98–110)
Creat: 1.08 mg/dL (ref 0.70–1.18)
GFR, Est African American: 79 mL/min (ref >=60)
GFR, Est Non African American: 69 mL/min (ref >=60)
Glucose, Bld: 104 mg/dL — ABNORMAL HIGH (ref 65–99)
Potassium: 4.5 mmol/L (ref 3.5–5.3)
Sodium: 140 mmol/L (ref 135–146)
Total Bilirubin: 0.4 mg/dL (ref 0.2–1.2)
Total Protein: 6.9 g/dL (ref 6.1–8.1)

## 2015-10-26 LAB — LIPID PANEL
Cholesterol: 239 mg/dL — ABNORMAL HIGH (ref 125–200)
HDL: 37 mg/dL — ABNORMAL LOW (ref >=40)
LDL Cholesterol: 136 mg/dL — ABNORMAL HIGH (ref <130)
Total CHOL/HDL Ratio: 6.5 Ratio — ABNORMAL HIGH (ref <=5.0)
Triglycerides: 331 mg/dL — ABNORMAL HIGH (ref <150)
VLDL: 66 mg/dL — ABNORMAL HIGH (ref <30)

## 2015-10-26 LAB — CK: Total CK: 109 U/L (ref 7–232)

## 2015-12-09 ENCOUNTER — Ambulatory Visit: Payer: Medicare Other

## 2015-12-09 ENCOUNTER — Ambulatory Visit (INDEPENDENT_AMBULATORY_CARE_PROVIDER_SITE_OTHER): Payer: Medicare Other | Admitting: Physician Assistant

## 2015-12-09 VITALS — BP 132/72 | HR 73 | Temp 97.5°F | Resp 20 | Ht 68.0 in | Wt 209.2 lb

## 2015-12-09 DIAGNOSIS — S90851A Superficial foreign body, right foot, initial encounter: Secondary | ICD-10-CM

## 2015-12-09 NOTE — Patient Instructions (Signed)
     IF you received an x-ray today, you will receive an invoice from [facility]. Please contact [facility] at [phone] with questions or concerns regarding your invoice.   IF you received labwork today, you will receive an invoice from [facility]. Please contact [facility] at [phone] with questions or concerns regarding your invoice.   Our billing staff will not be able to assist you with questions regarding bills from these companies.  You will be contacted with the lab results as soon as they are available. The fastest way to get your results is to activate your My Chart account. Instructions are located on the last page of this paperwork. If you have not heard from us regarding the results in 2 weeks, please contact this office.      

## 2015-12-09 NOTE — Progress Notes (Signed)
[name] [name]  MRN: [id number] DOB: 1945-04-13  PCP: [name], MD  Subjective:  Pt is a pleasant 70 year old male presents to clinic for something stuck in his foot x three days. He feels a small area under his middle toe on the bottom of his foot that is tender when he walks and when he presses on it. He cannot see it very well and is unsure if there is something there. He feels a little bump. He does not recall stepping on anything in particular. No MOI. He remembers breaking glass last week and cleaning it up.   Review of Systems  Respiratory: Negative.   Cardiovascular: Negative.   Musculoskeletal: Positive for gait problem.  Skin: Positive for wound. Negative for color change, pallor and rash.  Neurological: Negative for weakness and numbness.    Patient Active Problem List   Diagnosis Date Noted  . Insomnia 09/14/2015  . Hyperlipidemia with target LDL less than 130 09/11/2014  . Rotator cuff tendinitis 04/28/2014  . Obesity (BMI 30-39.9) 12/29/2013  . 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  . Exertional shortness of breath 06/26/2012  . Polyp of colon, adenomatous 04/27/2012  . Elevated PSA 10/20/2011  . Hypertriglyceridemia 07/08/2011  . Nonischemic cardiomyopathy ([place]) 09/04/2009    Current Outpatient Prescriptions on File Prior to Visit  Medication Sig Dispense Refill  . aspirin 325 MG tablet Take 325 mg by mouth daily.    . fish oil-omega-3 fatty acids 1000 MG capsule Take 1 g by mouth daily.    Marland Kitchen gabapentin (NEURONTIN) 300 MG capsule Take 1-2 capsules (300-600 mg total) by mouth 2 (two) times daily as needed. 360 capsule 1  . Garlic TABS Take 1 tablet by mouth daily.    . Magnesium 250 MG TABS Take by mouth daily.    . Multiple Vitamins-Minerals (MULTIVITAMIN PO) Take 1 tablet by mouth daily.    . Naproxen Sodium (ALEVE PO) Take by mouth as needed. Reported on 05/05/2015    . pyridoxine (B-6) 100 MG tablet Take 100 mg by mouth daily.    . sildenafil  (VIAGRA) 100 MG tablet 1/2 to 1 tablet up to each day as needed. 10 tablet 5  . Turmeric Curcumin 500 MG CAPS Take 1 tablet by mouth daily.    . vitamin C (ASCORBIC ACID) 500 MG tablet Take 500 mg by mouth daily.    . meloxicam (MOBIC) 15 MG tablet Take 1 tablet (15 mg total) by mouth daily. (Patient not taking: Reported on 12/09/2015) 30 tablet 0   No current facility-administered medications on file prior to visit.     Allergies  Allergen Reactions  . Nitroglycerin Anaphylaxis    "Cardiologist suggested he shouldn't take this med because it could kill him with his heart condition. Lowers BP"  . Ace Inhibitors Swelling  . Lovastatin Nausea Only  . Solarcaine [Benzocaine] Dermatitis    Objective:  BP 132/72 (BP Location: Right Arm, Patient Position: Sitting, Cuff Size: Large)   Pulse 73   Temp 97.5 F (36.4 C) (Oral)   Resp 20   Ht 5\' 8"  (1.727 m)   Wt 209 lb 3.2 oz (94.9 kg)   SpO2 97%   BMI 31.81 kg/m   Physical Exam  Constitutional: He is oriented to person, place, and time and well-developed, well-nourished, and in no distress. No distress.  Cardiovascular: Normal rate, regular rhythm and normal heart sounds.   Neurological: He is alert and oriented to person, place, and time. GCS score is  15.  Skin: Skin is warm and dry.  0.5 cm nodule palpated on plantar aspect of foot between 2nd and 3rd metatarsal. Surrounding skin slightly tinted. Brown in color. TTP. No streaking, drainage or erythema.   Psychiatric: Mood, memory, affect and judgment normal.  Vitals reviewed.  Procedure Verbal consent obtained. Area was cleaned with alcohol and numbed with 2% Lidocaine with Epi. Betadine applied.  Incision in the shape of an "x" made over the area of concern with 11 blade. At that time a small splinter was exposed and removed with pick-ups. Foot was cleansed with saline and wrapped with gauze and coban.  Assessment and Plan :  1. Foreign body in foot, right, initial encounter -  Splinter removed from foot without complication. Supportive care: Instructed patient to keep foot clean, use Antibiotic ointment, and cover with bandage. Soak in warm water.   Mercer Pod, PA-C  [facility] 12/09/2015 12:55 PM

## 2015-12-21 DIAGNOSIS — R972 Elevated prostate specific antigen [PSA]: Secondary | ICD-10-CM | POA: Diagnosis not present

## 2015-12-21 DIAGNOSIS — N5201 Erectile dysfunction due to arterial insufficiency: Secondary | ICD-10-CM | POA: Diagnosis not present

## 2016-02-09 DIAGNOSIS — Z23 Encounter for immunization: Secondary | ICD-10-CM | POA: Diagnosis not present

## 2016-03-05 ENCOUNTER — Emergency Department (HOSPITAL_COMMUNITY): Payer: Medicare Other

## 2016-03-05 ENCOUNTER — Encounter (HOSPITAL_COMMUNITY): Payer: Self-pay

## 2016-03-05 ENCOUNTER — Emergency Department (HOSPITAL_COMMUNITY)
Admission: EM | Admit: 2016-03-05 | Discharge: 2016-03-05 | Disposition: A | Discharge status: Home / Self Care | Payer: Medicare Other | Attending: Emergency Medicine | Admitting: Emergency Medicine

## 2016-03-05 DIAGNOSIS — R930 Abnormal findings on diagnostic imaging of skull and head, not elsewhere classified: Secondary | ICD-10-CM | POA: Insufficient documentation

## 2016-03-05 DIAGNOSIS — Z7982 Long term (current) use of aspirin: Secondary | ICD-10-CM | POA: Insufficient documentation

## 2016-03-05 DIAGNOSIS — Z87891 Personal history of nicotine dependence: Secondary | ICD-10-CM | POA: Insufficient documentation

## 2016-03-05 DIAGNOSIS — H538 Other visual disturbances: Secondary | ICD-10-CM | POA: Diagnosis not present

## 2016-03-05 DIAGNOSIS — H539 Unspecified visual disturbance: Secondary | ICD-10-CM | POA: Insufficient documentation

## 2016-03-05 DIAGNOSIS — S0990XA Unspecified injury of head, initial encounter: Secondary | ICD-10-CM | POA: Diagnosis not present

## 2016-03-05 NOTE — ED Triage Notes (Signed)
PT C/O SEEING FLASHES OF LIGHT RANDOMLY SINCE THIS MORNING TO THE LEFT EYE. PT STS HE FIRST STARTED SEEING A "WHITE LINE", AND NOW HE IS SEEING A "RED LINE". DENIES ANY PAIN OR RECENT INJURY TO THE EYE

## 2016-03-05 NOTE — ED Notes (Signed)
MD Audie Pinto talking to patient in triage.

## 2016-03-05 NOTE — ED Provider Notes (Signed)
[facility] DEPT Provider Note   CSN: [id number] Arrival date & time: 03/05/16  [id number]     History   Chief Complaint Chief Complaint  Patient presents with  . Eye Problem    HPI [name] [name] is a 70 y.o. male with history of hyperlipidemia, nonischemic cardiomyopathy and cardiac catheterization presents with left eye disturbance since this morning. He states that he saw a intermittent vertical white line then a red line as the day progressed that spontaneously manifested and would last for several seconds and the lateral periphery of left eye. He states he doesn't go to an ophthmalthat he hasn't gone to the optometric in "a while." He reports never having something like this before. Patient admits history of seeing floaters and says this is different. Patient denies wearing contacts. He denies any hypertension, diabetes, trauma to the eye, any other eye pathologies in the past. Patient denies eye pain or any other changes in vision, such as blurry vision or double vision, halos, tunnel vision. Patient denies chest pain, shortness of breath, fevers, chills, urinary symptoms, or changes in bowel movements.  HPI  Past Medical History:  Diagnosis Date  . Arthritis   . BPH (benign prostatic hypertrophy)   . Coronary atherosclerosis of native coronary vessel    CATH 2004--  MINIMAL NONOBSTRUCTIVE LAD/  NORMAL EF/    CARDIAC CT  03/2008  NO SIGNIFICANT DISEASE  AND  nlef  . Elevated PSA   . History of gout    pt 05-20-2013 states stable   . History of melanoma excision    2011-- LEFT UPPER BACK  . Hyperlipemia   . Hypertriglyceridemia   . Melanoma ([place]) 2011   back  . Nonischemic cardiomyopathy ([place])    09-04-2009  --  2D echo-EF 40-45%, Global Hypokinesis  . Personal history of arthritis   . Personal history of other diseases of circulatory system     Patient Active Problem List   Diagnosis Date Noted  . Insomnia 09/14/2015  . Hyperlipidemia with target LDL less than 130  09/11/2014  . Rotator cuff tendinitis 04/28/2014  . Obesity (BMI 30-39.9) 12/29/2013  . 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  . Exertional shortness of breath 06/26/2012  . Polyp of colon, adenomatous 04/27/2012  . Elevated PSA 10/20/2011  . Hypertriglyceridemia 07/08/2011  . Nonischemic cardiomyopathy ([place]) 09/04/2009    Past Surgical History:  Procedure Laterality Date  . APPENDECTOMY  AS CHILD  . CARDIAC CATHETERIZATION  05/23/2002  &  05-23-1998   DR AL LITTLE   minimal irregularities in the LAD/  EF 50%  . CARDIOVASCULAR STRESS TEST  10-05-1998   MILD GLOBAL HYPOKINESIS AND ISCHEMIAIN ANTEROSEPTAL  AT [facility]/ EF 42%  . EXCISION MELANOMA LEFT UPPER BACK  10-14-2009  . LUMBAR DISC SURGERY  09-12-2000   left  L5 -- S1  . PROSTATE BIOPSY N/A 05/22/2013   Procedure: BIOPSY TRANSRECTAL ULTRASONIC PROSTATE (TUBP);  Surgeon: [name], MD;  Location: [facility];  Service: Urology;  Laterality: N/A;  . ROTATOR CUFF REPAIR Right 2016  . TRANSRECTAL ULTRASOUND PROSTATE BX  05-23-2005  &  04-19-2001  . TRANSTHORACIC ECHOCARDIOGRAM  09-04-2009   MILD LVH/  MILD GLOBAL HYPOKINESIS/ LVEF 40-45%/  MILD AV SCLEROSIS WITHOUT STENOSIS/  MILD PVR       Home Medications    Prior to Admission medications   Medication Sig Start Date End Date Taking? Authorizing Provider  aspirin 325 MG tablet Take 325 mg by mouth daily.  Yes Historical Provider, MD  fish oil-omega-3 fatty acids 1000 MG capsule Take 1 g by mouth daily.   Yes Historical Provider, MD  gabapentin (NEURONTIN) 300 MG capsule Take 1-2 capsules (300-600 mg total) by mouth 2 (two) times daily as needed. Patient taking differently: Take 300-600 mg by mouth 2 (two) times daily as needed (pain).  10/22/15  Yes [name], MD  Garlic TABS Take 1 tablet by mouth daily.   Yes Historical Provider, MD  Magnesium 250 MG TABS Take by mouth daily.   Yes Historical Provider, MD  Multiple Vitamins-Minerals  (MULTIVITAMIN PO) Take 1 tablet by mouth daily.   Yes Historical Provider, MD  naproxen sodium (ANAPROX) 220 MG tablet Take 440 mg by mouth 2 (two) times daily as needed (pain).   Yes Historical Provider, MD  polyvinyl alcohol (LIQUIFILM TEARS) 1.4 % ophthalmic solution Place 1 drop into both eyes daily as needed for dry eyes.   Yes Historical Provider, MD  pyridoxine (B-6) 100 MG tablet Take 100 mg by mouth daily.   Yes Historical Provider, MD  sildenafil (VIAGRA) 100 MG tablet 1/2 to 1 tablet up to each day as needed. Patient taking differently: Take 50-100 mg by mouth daily as needed for erectile dysfunction.  07/08/11  Yes [name], MD  Turmeric Curcumin 500 MG CAPS Take 1 tablet by mouth daily.   Yes Historical Provider, MD  meloxicam (MOBIC) 15 MG tablet Take 1 tablet (15 mg total) by mouth daily. Patient not taking: Reported on 12/09/2015 06/26/14   Roselee Culver, MD    Family History Family History  Problem Relation Age of Onset  . Arthritis Mother   . COPD Father   . Cancer Father     lung cancer  . Cancer Sister   . Cardiomyopathy      idiopathic. trivial disease 2004 cath, 2010 cardiac CT - no sig. dz, nl LV fxn. cards: Little  . Colon cancer Neg Hx     Social History Social History  Substance Use Topics  . Smoking status: Former Smoker    Packs/day: 2.00    Years: 20.00    Types: Cigarettes    Quit date: 03/28/1980  . Smokeless tobacco: Never Used  . Alcohol use No     Allergies   Nitroglycerin; Ace inhibitors; Lovastatin; and Solarcaine [benzocaine]   Review of Systems Review of Systems  Constitutional: Negative for chills and fever.  Eyes: Positive for visual disturbance. Negative for photophobia, pain, discharge, redness and itching.       Patient denies eye pain or any other changes in vision, such as blurry vision or double vision, halos, tunnel vision.   Respiratory: Negative for shortness of breath.   Cardiovascular: Negative for chest pain  and leg swelling.  Gastrointestinal: Negative for abdominal pain, constipation, diarrhea, nausea and vomiting.  Genitourinary: Negative for difficulty urinating.  Musculoskeletal: Negative for gait problem and neck pain.  Neurological: Negative for speech difficulty, weakness, light-headedness, numbness and headaches.     Physical Exam Updated Vital Signs BP (!) 114/51 (BP Location: Right Arm)   Pulse 76   Resp 20   Ht 5\' 10"  (1.778 m)   Wt 96 kg   SpO2 100%   BMI 30.36 kg/m   Physical Exam  Constitutional: He is oriented to person, place, and time. He appears well-developed and well-nourished.  HENT:  Head: Normocephalic and atraumatic.  Nose: Nose normal.  Mouth/Throat: Oropharynx is clear and moist.  Eyes: Conjunctivae, EOM and  lids are normal. Pupils are equal, round, and reactive to light. Right eye exhibits no discharge and no exudate. No foreign body present in the right eye. Left eye exhibits no discharge and no exudate. No foreign body present in the left eye.  Neck: Normal range of motion. Neck supple.  Cardiovascular: Normal rate and normal heart sounds.   Pulmonary/Chest: Effort normal and breath sounds normal. No respiratory distress.  Abdominal: Soft.  Musculoskeletal: Normal range of motion.  Neurological: He is alert and oriented to person, place, and time.  Skin: Skin is warm.  Psychiatric: He has a normal mood and affect. His behavior is normal.  Nursing note and vitals reviewed.    ED Treatments / Results  Labs (all labs ordered are listed, but only abnormal results are displayed) Labs Reviewed - No data to display  EKG  EKG Interpretation None       Radiology Ct Head Wo Contrast  Result Date: 03/05/2016 CLINICAL DATA:  PT C/O SEEING FLASHES OF LIGHT RANDOMLY SINCE THIS MORNING TO THE LEFT EYE. PT STS HE FIRST STARTED SEEING A "WHITE LINE", AND NOW HE IS SEEING A "RED LINE". DENIES ANY PAIN OR RECENT INJURY TO THE EYE EXAM: CT HEAD WITHOUT  CONTRAST TECHNIQUE: Contiguous axial images were obtained from the base of the skull through the vertex without intravenous contrast. COMPARISON:  10/17/2009 FINDINGS: Brain: There is no intra or extra-axial fluid collection or mass lesion. The basilar cisterns and ventricles have a normal appearance. There is no CT evidence for acute infarction or hemorrhage. Vascular: No hyperdense vessel or unexpected calcification. Skull: Normal. Negative for fracture or focal lesion. Sinuses/Orbits: There is mucosal thickening of the ethmoid and sphenoid air cells. No air-fluid levels are identified. Other: None IMPRESSION: 1.  No evidence for acute intracranial abnormality. 2. Paranasal sinus disease. Electronically Signed   By: Nolon Nations M.D.   On: 03/05/2016 19:31    Procedures Procedures (including critical care time)  Medications Ordered in ED Medications - No data to display   Initial Impression / Assessment and Plan / ED Course  I have reviewed the triage vital signs and the nursing notes.  Pertinent labs & imaging results that were available during my care of the patient were reviewed by me and considered in my medical decision making (see chart for details).  Clinical Course   Patient also seen by Dr. Audie Pinto. Patient is a 70 year old male presenting with left visual disturbance since this morning. On exam patient is NAD, VSS, afebrile. He denies any history of eye pathology. He does not go to an ophthalmologist. Patient denies eye pain or any other changes in vision, such as blurry vision or double vision, halos, tunnel vision. Vision acuity normal. Lids normal, no foreign bodies present, no exudate, no discharge, conjunctiva appears normal, PEARL and EOM normal. CT exam negative for any acute findings. Ophthalmologist, Dr. Katy Fitch, consulted and recommended follow-up at office on Monday or Tuesday. He also recommended to call immediately if there were any increase in visual disturbance or vision  loss. Patient understood. Patient agreed with assessment and plan. Return precautions given for any new or worsening symptoms such as increase in vision disturbance, vision loss.   As patient walked out of ED, he began regaining symptoms of the intermittent white line and "flash." Dr. Audie Pinto again consulted Dr. Katy Fitch, who recommended to come by his office tomorrow morning for immediate follow-up. Patient understood and agreed to meet tomorrow at Dr. Zenia Resides office.   Final Clinical  Impressions(s) / ED Diagnoses   Final diagnoses:  Visual disturbance    New Prescriptions Discharge Medication List as of 03/05/2016  9:51 PM       [place], [place] 03/05/16 [place], [place] 03/05/16 2238    Leonard Schwartz, MD 03/06/16 1224

## 2016-03-05 NOTE — ED Notes (Signed)
Patient denies pain.

## 2016-03-05 NOTE — Discharge Instructions (Signed)
Please schedule an appointment with Dr. Katy Fitch on Monday for follow-up. If visual disturbance increases significantly with vision loss call Dr. Katy Fitch immediately or return to ED.   SEEK IMMEDIATE MEDICAL CARE IF:  Your vision gets worse. You develop severe headaches. You have any weakness or numbness in the face, arms, or legs. You have any trouble speaking or walking.

## 2016-03-05 NOTE — ED Notes (Signed)
Eye exam normal. PERRLA, denies any complications, pain, or light changes when shining light in eyes, able to follow finger with H-test and cardinal fields of gaze without stigmas observed. Patient denies visual changes or flashing lights at this time. States he have had flashing lights and lines in Left eye off and on today but now since he has been here no problems. Vision is normals. States he wears only glasses for reading.

## 2016-03-05 NOTE — ED Notes (Signed)
Patient denies pain and is resting comfortably.  

## 2016-03-05 NOTE — ED Notes (Signed)
Visual Acuity Results:  Visual Acuity - Bilateral Near: 20/25 (with glasses ) Bilateral Distance: 20/40 (without glasses ) R Near: 20/25 (with glasses ) R Distance: 20/100 (without glasses) L Near: 20/30 (with glasses ) L Distance: 20/100 (without glasses)

## 2016-03-05 NOTE — ED Notes (Signed)
Pt stepped outside and saw flash of light in left visual field. MD Audie Pinto notified.

## 2016-03-05 NOTE — ED Notes (Signed)
Family at bedside. 

## 2016-03-06 DIAGNOSIS — H43812 Vitreous degeneration, left eye: Secondary | ICD-10-CM | POA: Diagnosis not present

## 2016-03-06 DIAGNOSIS — H579 Unspecified disorder of eye and adnexa: Secondary | ICD-10-CM

## 2016-03-06 DIAGNOSIS — H2513 Age-related nuclear cataract, bilateral: Secondary | ICD-10-CM | POA: Diagnosis not present

## 2016-03-06 DIAGNOSIS — D2312 Other benign neoplasm of skin of left eyelid, including canthus: Secondary | ICD-10-CM | POA: Diagnosis not present

## 2016-03-06 HISTORY — DX: Unspecified disorder of eye and adnexa: H57.9

## 2016-03-11 DIAGNOSIS — D2312 Other benign neoplasm of skin of left eyelid, including canthus: Secondary | ICD-10-CM | POA: Diagnosis not present

## 2016-03-11 DIAGNOSIS — H43812 Vitreous degeneration, left eye: Secondary | ICD-10-CM | POA: Diagnosis not present

## 2016-03-11 DIAGNOSIS — H2513 Age-related nuclear cataract, bilateral: Secondary | ICD-10-CM | POA: Diagnosis not present

## 2016-03-11 DIAGNOSIS — D3132 Benign neoplasm of left choroid: Secondary | ICD-10-CM | POA: Diagnosis not present

## 2016-03-16 ENCOUNTER — Ambulatory Visit (INDEPENDENT_AMBULATORY_CARE_PROVIDER_SITE_OTHER): Payer: Medicare Other | Admitting: Family Medicine

## 2016-03-16 VITALS — BP 128/68 | HR 84 | Temp 97.6°F | Resp 16 | Ht 68.0 in | Wt 208.0 lb

## 2016-03-16 DIAGNOSIS — J069 Acute upper respiratory infection, unspecified: Secondary | ICD-10-CM | POA: Diagnosis not present

## 2016-03-16 NOTE — Patient Instructions (Addendum)
Saline nasal spray or netipot for nasal congestion. Up to 4 times per day, over the counter mucinex or mucinex DM for cough, cepacol or other sore throat lozenge if needed, drink plenty of fluids.  Return to the clinic or go to the nearest emergency room if any of your symptoms worsen or new symptoms occur.   Upper Respiratory Infection, Adult Most upper respiratory infections (URIs) are a viral infection of the air passages leading to the lungs. A URI affects the nose, throat, and upper air passages. The most common type of URI is nasopharyngitis and is typically referred to as "the common cold." URIs run their course and usually go away on their own. Most of the time, a URI does not require medical attention, but sometimes a bacterial infection in the upper airways can follow a viral infection. This is called a secondary infection. Sinus and middle ear infections are common types of secondary upper respiratory infections. Bacterial pneumonia can also complicate a URI. A URI can worsen asthma and chronic obstructive pulmonary disease (COPD). Sometimes, these complications can require emergency medical care and may be life threatening. What are the causes? Almost all URIs are caused by viruses. A virus is a type of germ and can spread from one person to another. What increases the risk? You may be at risk for a URI if:  You smoke.  You have chronic heart or lung disease.  You have a weakened defense (immune) system.  You are very young or very old.  You have nasal allergies or asthma.  You work in crowded or poorly ventilated areas.  You work in health care facilities or schools. What are the signs or symptoms? Symptoms typically develop 2-3 days after you come in contact with a cold virus. Most viral URIs last 7-10 days. However, viral URIs from the influenza virus (flu virus) can last 14-18 days and are typically more severe. Symptoms may include:  Runny or stuffy (congested)  nose.  Sneezing.  Cough.  Sore throat.  Headache.  Fatigue.  Fever.  Loss of appetite.  Pain in your forehead, behind your eyes, and over your cheekbones (sinus pain).  Muscle aches. How is this diagnosed? Your health care provider may diagnose a URI by:  Physical exam.  Tests to check that your symptoms are not due to another condition such as:  Strep throat.  Sinusitis.  Pneumonia.  Asthma. How is this treated? A URI goes away on its own with time. It cannot be cured with medicines, but medicines may be prescribed or recommended to relieve symptoms. Medicines may help:  Reduce your fever.  Reduce your cough.  Relieve nasal congestion. Follow these instructions at home:  Take medicines only as directed by your health care provider.  Gargle warm saltwater or take cough drops to comfort your throat as directed by your health care provider.  Use a warm mist humidifier or inhale steam from a shower to increase air moisture. This may make it easier to breathe.  Drink enough fluid to keep your urine clear or pale yellow.  Eat soups and other clear broths and maintain good nutrition.  Rest as needed.  Return to work when your temperature has returned to normal or as your health care provider advises. You may need to stay home longer to avoid infecting others. You can also use a face mask and careful hand washing to prevent spread of the virus.  Increase the usage of your inhaler if you have asthma.  Do  not use any tobacco products, including cigarettes, chewing tobacco, or electronic cigarettes. If you need help quitting, ask your health care provider. How is this prevented? The best way to protect yourself from getting a cold is to practice good hygiene.  Avoid oral or hand contact with people with cold symptoms.  Wash your hands often if contact occurs. There is no clear evidence that vitamin C, vitamin E, echinacea, or exercise reduces the chance of  developing a cold. However, it is always recommended to get plenty of rest, exercise, and practice good nutrition. Contact a health care provider if:  You are getting worse rather than better.  Your symptoms are not controlled by medicine.  You have chills.  You have worsening shortness of breath.  You have brown or red mucus.  You have yellow or brown nasal discharge.  You have pain in your face, especially when you bend forward.  You have a fever.  You have swollen neck glands.  You have pain while swallowing.  You have white areas in the back of your throat. Get help right away if:  You have severe or persistent:  Headache.  Ear pain.  Sinus pain.  Chest pain.  You have chronic lung disease and any of the following:  Wheezing.  Prolonged cough.  Coughing up blood.  A change in your usual mucus.  You have a stiff neck.  You have changes in your:  Vision.  Hearing.  Thinking.  Mood. This information is not intended to replace advice given to you by your health care provider. Make sure you discuss any questions you have with your health care provider. Document Released: 09/07/2000 Document Revised: 11/15/2015 Document Reviewed: 06/19/2013 Elsevier Interactive Patient Education  2017 [employer].   IF you received an x-ray today, you will receive an invoice from [facility]. Please contact [facility] at [phone] with questions or concerns regarding your invoice.   IF you received labwork today, you will receive an invoice from [facility]. Please contact [facility] at [phone] with questions or concerns regarding your invoice.   Our billing staff will not be able to assist you with questions regarding bills from these companies.  You will be contacted with the lab results as soon as they are available. The fastest way to get your results is to activate your My Chart account. Instructions are located on the last page of this  paperwork. If you have not heard from [place] regarding the results in 2 weeks, please contact this office.

## 2016-03-16 NOTE — Progress Notes (Signed)
Subjective:  By signing my name below, I, [name], attest that this documentation has been prepared under the direction and in the presence of [name], MD.  Electronically Signed: [name], ED Scribe. 03/16/2016. 2:09 PM.   Patient ID: [name] [name], male    DOB: 1945/07/08, 70 y.o.   MRN: [id number]  HPI   Chief Complaint  Patient presents with  . Sinus Problem    X 2-3 days   . Cough  . Generalized Body Aches  . CHEST CONGESTION    HPI Comments: [name] [name] is a 70 y.o. male who presents to the [facility] complaining of nasal congestion that began 4 days ago. Pt reports trouble sleeping due to nasal congestion. Pt reports associated cough, body aches, post nasal drip and sore throat. Pt has been taking OTC medication.  Pt reports sick contacts of a friend with a cough. He denies fevers and trouble swallowing.   Patient Active Problem List   Diagnosis Date Noted  . Insomnia 09/14/2015  . Hyperlipidemia with target LDL less than 130 09/11/2014  . Rotator cuff tendinitis 04/28/2014  . Obesity (BMI 30-39.9) 12/29/2013  . 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  . Exertional shortness of breath 06/26/2012  . Polyp of colon, adenomatous 04/27/2012  . Elevated PSA 10/20/2011  . Hypertriglyceridemia 07/08/2011  . Nonischemic cardiomyopathy ([place]) 09/04/2009   Past Medical History:  Diagnosis Date  . Arthritis   . BPH (benign prostatic hypertrophy)   . Coronary atherosclerosis of native coronary vessel    CATH 2004--  MINIMAL NONOBSTRUCTIVE LAD/  NORMAL EF/    CARDIAC CT  03/2008  NO SIGNIFICANT DISEASE  AND  nlef  . Elevated PSA   . Eye problem 03/06/2016   [name]   . History of gout    pt 05-20-2013 states stable   . History of melanoma excision    2011-- LEFT UPPER BACK  . Hyperlipemia   . Hypertriglyceridemia   . Melanoma ([place]) 2011   back  . Nonischemic cardiomyopathy ([place])    09-04-2009  --  2D echo-EF 40-45%, Global  Hypokinesis  . Personal history of arthritis   . Personal history of other diseases of circulatory system    Past Surgical History:  Procedure Laterality Date  . APPENDECTOMY  AS CHILD  . CARDIAC CATHETERIZATION  05/23/2002  &  05-23-1998   DR AL LITTLE   minimal irregularities in the LAD/  EF 50%  . CARDIOVASCULAR STRESS TEST  10-05-1998   MILD GLOBAL HYPOKINESIS AND ISCHEMIAIN ANTEROSEPTAL  AT [facility]/ EF 42%  . EXCISION MELANOMA LEFT UPPER BACK  10-14-2009  . LUMBAR DISC SURGERY  09-12-2000   left  L5 -- S1  . PROSTATE BIOPSY N/A 05/22/2013   Procedure: BIOPSY TRANSRECTAL ULTRASONIC PROSTATE (TUBP);  Surgeon: [name], MD;  Location: [facility];  Service: Urology;  Laterality: N/A;  . ROTATOR CUFF REPAIR Right 2016  . TRANSRECTAL ULTRASOUND PROSTATE BX  05-23-2005  &  04-19-2001  . TRANSTHORACIC ECHOCARDIOGRAM  09-04-2009   MILD LVH/  MILD GLOBAL HYPOKINESIS/ LVEF 40-45%/  MILD AV SCLEROSIS WITHOUT STENOSIS/  MILD PVR   Allergies  Allergen Reactions  . Nitroglycerin Anaphylaxis    "Cardiologist suggested he shouldn't take this med because it could kill him with his heart condition. Lowers BP"  . Ace Inhibitors Swelling  . Lovastatin Nausea Only  . Solarcaine [Benzocaine] Dermatitis   Prior to Admission medications   Medication Sig  Start Date End Date Taking? Authorizing Provider  aspirin 325 MG tablet Take 325 mg by mouth daily.   Yes Historical Provider, MD  fish oil-omega-3 fatty acids 1000 MG capsule Take 1 g by mouth daily.   Yes Historical Provider, MD  gabapentin (NEURONTIN) 300 MG capsule Take 1-2 capsules (300-600 mg total) by mouth 2 (two) times daily as needed. Patient taking differently: Take 300-600 mg by mouth 2 (two) times daily as needed (pain).  10/22/15  Yes [name], MD  Garlic TABS Take 1 tablet by mouth daily.   Yes Historical Provider, MD  Magnesium 250 MG TABS Take by mouth daily.   Yes Historical Provider, MD  Multiple  Vitamins-Minerals (MULTIVITAMIN PO) Take 1 tablet by mouth daily.   Yes Historical Provider, MD  naproxen sodium (ANAPROX) 220 MG tablet Take 440 mg by mouth 2 (two) times daily as needed (pain).   Yes Historical Provider, MD  polyvinyl alcohol (LIQUIFILM TEARS) 1.4 % ophthalmic solution Place 1 drop into both eyes daily as needed for dry eyes.   Yes Historical Provider, MD  pyridoxine (B-6) 100 MG tablet Take 100 mg by mouth daily.   Yes Historical Provider, MD  sildenafil (VIAGRA) 100 MG tablet 1/2 to 1 tablet up to each day as needed. Patient taking differently: Take 50-100 mg by mouth daily as needed for erectile dysfunction.  07/08/11  Yes [name], MD  Turmeric Curcumin 500 MG CAPS Take 1 tablet by mouth daily.   Yes Historical Provider, MD   Social History   Social History  . Marital status: Widowed    Spouse name: N/A  . Number of children: N/A  . Years of education: N/A   Occupational History  . Futures trader, Part time     Supervises drywalling an anterior ceiling work.   Social History Main Topics  . Smoking status: Former Smoker    Packs/day: 2.00    Years: 20.00    Types: Cigarettes    Quit date: 03/28/1980  . Smokeless tobacco: Never Used  . Alcohol use No  . Drug use: No  . Sexual activity: Not on file   Other Topics Concern  . Not on file   Social History Narrative   He walks on a relatively regular basis about 15 minutes at a time mostly to the discomfort. He previously had walked up a 30-40 minutes a time without problems. Education: [employer].   Review of Systems  Constitutional: Negative for chills and fever.  HENT: Positive for congestion, postnasal drip, rhinorrhea and sore throat. Negative for trouble swallowing.   Respiratory: Positive for cough.     Objective:   Physical Exam  Constitutional: He is oriented to person, place, and time. He appears well-developed and well-nourished. No distress.  HENT:  Head: Normocephalic and  atraumatic.  Nose: Right sinus exhibits no maxillary sinus tenderness and no frontal sinus tenderness. Left sinus exhibits no maxillary sinus tenderness and no frontal sinus tenderness.  Mouth/Throat: Uvula is midline. No oropharyngeal exudate or posterior oropharyngeal erythema.  Eyes: Conjunctivae and EOM are normal.  Neck: Neck supple.  Cardiovascular: Normal rate.   Pulmonary/Chest: Effort normal.  Musculoskeletal: Normal range of motion.  Lymphadenopathy:    He has no cervical adenopathy.  Neurological: He is alert and oriented to person, place, and time.  Skin: Skin is warm and dry.  Psychiatric: He has a normal mood and affect. His behavior is normal.  Nursing note and vitals reviewed.  Vitals:  03/16/16 1351  BP: 128/68  Pulse: 84  Resp: 16  Temp: 97.6 F (36.4 C)  TempSrc: Oral  SpO2: 97%  Weight: 208 lb (94.3 kg)  Height: 5\' 8"  (1.727 m)   Assessment & Plan:   KENDRE KOLLMAN is a 70 y.o. male Acute upper respiratory infection  - Sick contact with similar symptoms, reassuring vitals and exam, suspected viral illness.  -Mucinex as needed for cough, saline nasal spray related pot for nasal congestion, drink fluids, Cepacol or other sore throat lozenges as needed. RTC precautions discussed. Avoid decongestants with underlying heart disease.  No orders of the defined types were placed in this encounter.  Patient Instructions    Saline nasal spray or netipot for nasal congestion. Up to 4 times per day, over the counter mucinex or mucinex DM for cough, cepacol or other sore throat lozenge if needed, drink plenty of fluids.  Return to the clinic or go to the nearest emergency room if any of your symptoms worsen or new symptoms occur.   Upper Respiratory Infection, Adult Most upper respiratory infections (URIs) are a viral infection of the air passages leading to the lungs. A URI affects the nose, throat, and upper air passages. The most common type of URI is  nasopharyngitis and is typically referred to as "the common cold." URIs run their course and usually go away on their own. Most of the time, a URI does not require medical attention, but sometimes a bacterial infection in the upper airways can follow a viral infection. This is called a secondary infection. Sinus and middle ear infections are common types of secondary upper respiratory infections. Bacterial pneumonia can also complicate a URI. A URI can worsen asthma and chronic obstructive pulmonary disease (COPD). Sometimes, these complications can require emergency medical care and may be life threatening. What are the causes? Almost all URIs are caused by viruses. A virus is a type of germ and can spread from one person to another. What increases the risk? You may be at risk for a URI if:  You smoke.  You have chronic heart or lung disease.  You have a weakened defense (immune) system.  You are very young or very old.  You have nasal allergies or asthma.  You work in crowded or poorly ventilated areas.  You work in health care facilities or schools. What are the signs or symptoms? Symptoms typically develop 2-3 days after you come in contact with a cold virus. Most viral URIs last 7-10 days. However, viral URIs from the influenza virus (flu virus) can last 14-18 days and are typically more severe. Symptoms may include:  Runny or stuffy (congested) nose.  Sneezing.  Cough.  Sore throat.  Headache.  Fatigue.  Fever.  Loss of appetite.  Pain in your forehead, behind your eyes, and over your cheekbones (sinus pain).  Muscle aches. How is this diagnosed? Your health care provider may diagnose a URI by:  Physical exam.  Tests to check that your symptoms are not due to another condition such as:  Strep throat.  Sinusitis.  Pneumonia.  Asthma. How is this treated? A URI goes away on its own with time. It cannot be cured with medicines, but medicines may be  prescribed or recommended to relieve symptoms. Medicines may help:  Reduce your fever.  Reduce your cough.  Relieve nasal congestion. Follow these instructions at home:  Take medicines only as directed by your health care provider.  Gargle warm saltwater or take cough drops  to comfort your throat as directed by your health care provider.  Use a warm mist humidifier or inhale steam from a shower to increase air moisture. This may make it easier to breathe.  Drink enough fluid to keep your urine clear or pale yellow.  Eat soups and other clear broths and maintain good nutrition.  Rest as needed.  Return to work when your temperature has returned to normal or as your health care provider advises. You may need to stay home longer to avoid infecting others. You can also use a face mask and careful hand washing to prevent spread of the virus.  Increase the usage of your inhaler if you have asthma.  Do not use any tobacco products, including cigarettes, chewing tobacco, or electronic cigarettes. If you need help quitting, ask your health care provider. How is this prevented? The best way to protect yourself from getting a cold is to practice good hygiene.  Avoid oral or hand contact with people with cold symptoms.  Wash your hands often if contact occurs. There is no clear evidence that vitamin C, vitamin E, echinacea, or exercise reduces the chance of developing a cold. However, it is always recommended to get plenty of rest, exercise, and practice good nutrition. Contact a health care provider if:  You are getting worse rather than better.  Your symptoms are not controlled by medicine.  You have chills.  You have worsening shortness of breath.  You have brown or red mucus.  You have yellow or brown nasal discharge.  You have pain in your face, especially when you bend forward.  You have a fever.  You have swollen neck glands.  You have pain while swallowing.  You  have white areas in the back of your throat. Get help right away if:  You have severe or persistent:  Headache.  Ear pain.  Sinus pain.  Chest pain.  You have chronic lung disease and any of the following:  Wheezing.  Prolonged cough.  Coughing up blood.  A change in your usual mucus.  You have a stiff neck.  You have changes in your:  Vision.  Hearing.  Thinking.  Mood. This information is not intended to replace advice given to you by your health care provider. Make sure you discuss any questions you have with your health care provider. Document Released: 09/07/2000 Document Revised: 11/15/2015 Document Reviewed: 06/19/2013 Elsevier Interactive Patient Education  2017 [employer].   IF you received an x-ray today, you will receive an invoice from [facility]. Please contact [facility] at [phone] with questions or concerns regarding your invoice.   IF you received labwork today, you will receive an invoice from [facility]. Please contact [facility] at [phone] with questions or concerns regarding your invoice.   Our billing staff will not be able to assist you with questions regarding bills from these companies.  You will be contacted with the lab results as soon as they are available. The fastest way to get your results is to activate your My Chart account. Instructions are located on the last page of this paperwork. If you have not heard from [place] regarding the results in 2 weeks, please contact this office.        I personally performed the services described in this documentation, which was scribed in my presence. The recorded information has been reviewed and considered, and addended by me as needed.   Signed,   [name], MD [facility].  03/16/16 2:13 PM

## 2016-03-28 HISTORY — PX: OTHER SURGICAL HISTORY: SHX169

## 2016-04-06 ENCOUNTER — Other Ambulatory Visit: Payer: Self-pay | Admitting: Optometry

## 2016-04-06 DIAGNOSIS — D2311 Other benign neoplasm of skin of right eyelid, including canthus: Secondary | ICD-10-CM | POA: Diagnosis not present

## 2016-04-06 DIAGNOSIS — L820 Inflamed seborrheic keratosis: Secondary | ICD-10-CM | POA: Diagnosis not present

## 2016-04-06 DIAGNOSIS — D2312 Other benign neoplasm of skin of left eyelid, including canthus: Secondary | ICD-10-CM | POA: Diagnosis not present

## 2016-04-27 DIAGNOSIS — H02834 Dermatochalasis of left upper eyelid: Secondary | ICD-10-CM | POA: Diagnosis not present

## 2016-04-27 DIAGNOSIS — D2312 Other benign neoplasm of skin of left eyelid, including canthus: Secondary | ICD-10-CM | POA: Diagnosis not present

## 2016-04-27 DIAGNOSIS — H43812 Vitreous degeneration, left eye: Secondary | ICD-10-CM | POA: Diagnosis not present

## 2016-04-27 DIAGNOSIS — H02831 Dermatochalasis of right upper eyelid: Secondary | ICD-10-CM | POA: Diagnosis not present

## 2016-04-27 DIAGNOSIS — H2513 Age-related nuclear cataract, bilateral: Secondary | ICD-10-CM | POA: Diagnosis not present

## 2016-05-05 ENCOUNTER — Telehealth: Payer: Self-pay

## 2016-05-05 NOTE — Telephone Encounter (Signed)
Dr Katy Fitch office is calling to see if patient can stop aspirin for eye surgery   Best number [phone]  Surgery is next Thursday the 15th

## 2016-05-05 NOTE — Telephone Encounter (Signed)
I do not anticipate that being a problem, but may want to run this by his cardiologist, Dr. Ellyn Hack. I will also forward this note to Dr. Ellyn Hack to decide if it is okay for him to temporarily discontinue aspirin. Thanks. -JG.

## 2016-05-05 NOTE — Telephone Encounter (Signed)
Please advise 

## 2016-05-05 NOTE — Telephone Encounter (Signed)
L/m with mds note at dr Patrici Ranks

## 2016-05-05 NOTE — Telephone Encounter (Signed)
Would be perfectly fine to stop aspirin.  Glenetta Hew, MD

## 2016-05-12 DIAGNOSIS — H53453 Other localized visual field defect, bilateral: Secondary | ICD-10-CM | POA: Diagnosis not present

## 2016-05-12 DIAGNOSIS — H02834 Dermatochalasis of left upper eyelid: Secondary | ICD-10-CM | POA: Diagnosis not present

## 2016-05-12 DIAGNOSIS — H02831 Dermatochalasis of right upper eyelid: Secondary | ICD-10-CM | POA: Diagnosis not present

## 2016-07-12 DIAGNOSIS — L821 Other seborrheic keratosis: Secondary | ICD-10-CM | POA: Diagnosis not present

## 2016-07-12 DIAGNOSIS — D235 Other benign neoplasm of skin of trunk: Secondary | ICD-10-CM | POA: Diagnosis not present

## 2016-07-12 DIAGNOSIS — Z85820 Personal history of malignant melanoma of skin: Secondary | ICD-10-CM | POA: Diagnosis not present

## 2016-07-12 DIAGNOSIS — L570 Actinic keratosis: Secondary | ICD-10-CM | POA: Diagnosis not present

## 2016-07-12 DIAGNOSIS — L820 Inflamed seborrheic keratosis: Secondary | ICD-10-CM | POA: Diagnosis not present

## 2016-07-12 DIAGNOSIS — L814 Other melanin hyperpigmentation: Secondary | ICD-10-CM | POA: Diagnosis not present

## 2016-09-26 ENCOUNTER — Ambulatory Visit: Payer: Medicare Other | Admitting: Interventional Cardiology

## 2016-09-27 ENCOUNTER — Encounter: Payer: Self-pay | Admitting: Physician Assistant

## 2016-09-27 ENCOUNTER — Ambulatory Visit (INDEPENDENT_AMBULATORY_CARE_PROVIDER_SITE_OTHER): Payer: Medicare Other | Admitting: Physician Assistant

## 2016-09-27 VITALS — BP 110/76 | HR 79 | Ht 68.0 in | Wt 205.0 lb

## 2016-09-27 DIAGNOSIS — E785 Hyperlipidemia, unspecified: Secondary | ICD-10-CM | POA: Diagnosis not present

## 2016-09-27 DIAGNOSIS — R0789 Other chest pain: Secondary | ICD-10-CM

## 2016-09-27 DIAGNOSIS — I428 Other cardiomyopathies: Secondary | ICD-10-CM | POA: Diagnosis not present

## 2016-09-27 NOTE — Patient Instructions (Signed)
Medication Instructions:   No changes  Labwork:   None ordered. A fasting lipid panel is recommended, when you next see your primary care physician.  Testing/Procedures:  None.  Follow-Up:  6 months with Dr. Ellyn Hack  If you need a refill on your cardiac medications before your next appointment, please call your pharmacy.

## 2016-09-27 NOTE — Progress Notes (Signed)
Cardiology Office Note    Date:  09/27/2016   ID:  TIMON GEISSINGER, DOB February 23, 1946, MRN [id number]  PCP:  [name], MD  Cardiologist:  Dr. Ellyn Hack  Chief Complaint  Patient presents with  . Follow-up    chest pain on and off for a few months     History of Present Illness:  [name] [name] is a 71 y.o. male with PMH of hyperlipidemia, history of nonischemic cardiomyopathy, previous cardiac catheterization in 2004 showed minimal obstructive disease. Cardiac CT obtained in January 2010 also did not show significant disease. Last echocardiogram obtained in 2011 showed EF 40-45%, mild global hypokinesis. Last lipid panel obtained in July 2017 showed uncontrolled LDL, total cholesterol, and high triglyceride level. He is due for another fasting lipid panel when he sees his primary care physician this year. I recommended him to fax the copy to [place], if still elevated, I would recommend initiation of statin therapy. He has been complaining of some chest discomfort, in the past 2 months, he had 3 episodes where he felt as sharp stabbing discomfort on the right chest. It only last a fraction of a second before going away. Symptoms sound very atypical and does not occur with exertion. I recommend hold off on ischemic workup at this time and reassess via office visit in 6 months. Otherwise he is very active and frequently lift heavy object. He is aware that he needs to contact cardiology sooner if his symptoms become more frequent or become more related to exertion.   Past Medical History:  Diagnosis Date  . Arthritis   . BPH (benign prostatic hypertrophy)   . Coronary atherosclerosis of native coronary vessel    CATH 2004--  MINIMAL NONOBSTRUCTIVE LAD/  NORMAL EF/    CARDIAC CT  03/2008  NO SIGNIFICANT DISEASE  AND  nlef  . Elevated PSA   . Eye problem 03/06/2016   [name]   . History of gout    pt 05-20-2013 states stable   . History of melanoma excision    2011-- LEFT UPPER BACK  .  Hyperlipemia   . Hypertriglyceridemia   . Melanoma ([place]) 2011   back  . Nonischemic cardiomyopathy ([place])    09-04-2009  --  2D echo-EF 40-45%, Global Hypokinesis  . Personal history of arthritis   . Personal history of other diseases of circulatory system     Past Surgical History:  Procedure Laterality Date  . APPENDECTOMY  AS CHILD  . CARDIAC CATHETERIZATION  05/23/2002  &  05-23-1998   DR AL LITTLE   minimal irregularities in the LAD/  EF 50%  . CARDIOVASCULAR STRESS TEST  10-05-1998   MILD GLOBAL HYPOKINESIS AND ISCHEMIAIN ANTEROSEPTAL  AT [facility]/ EF 42%  . EXCISION MELANOMA LEFT UPPER BACK  10-14-2009  . LUMBAR DISC SURGERY  09-12-2000   left  L5 -- S1  . PROSTATE BIOPSY N/A 05/22/2013   Procedure: BIOPSY TRANSRECTAL ULTRASONIC PROSTATE (TUBP);  Surgeon: [name], MD;  Location: [facility];  Service: Urology;  Laterality: N/A;  . ROTATOR CUFF REPAIR Right 2016  . TRANSRECTAL ULTRASOUND PROSTATE BX  05-23-2005  &  04-19-2001  . TRANSTHORACIC ECHOCARDIOGRAM  09-04-2009   MILD LVH/  MILD GLOBAL HYPOKINESIS/ LVEF 40-45%/  MILD AV SCLEROSIS WITHOUT STENOSIS/  MILD PVR    Current Medications: Outpatient Medications Prior to Visit  Medication Sig Dispense Refill  . aspirin 325 MG tablet Take 325 mg by mouth daily.    Marland Kitchen  fish oil-omega-3 fatty acids 1000 MG capsule Take 1 g by mouth daily.    Marland Kitchen gabapentin (NEURONTIN) 300 MG capsule Take 1-2 capsules (300-600 mg total) by mouth 2 (two) times daily as needed. (Patient taking differently: Take 300-600 mg by mouth 2 (two) times daily as needed (pain). ) 360 capsule 1  . Garlic TABS Take 1 tablet by mouth daily.    . Multiple Vitamins-Minerals (MULTIVITAMIN PO) Take 1 tablet by mouth daily.    . naproxen sodium (ANAPROX) 220 MG tablet Take 440 mg by mouth 2 (two) times daily as needed (pain).    . polyvinyl alcohol (LIQUIFILM TEARS) 1.4 % ophthalmic solution Place 1 drop into both eyes daily as needed for dry eyes.      . sildenafil (VIAGRA) 100 MG tablet 1/2 to 1 tablet up to each day as needed. (Patient taking differently: Take 50-100 mg by mouth daily as needed for erectile dysfunction. ) 10 tablet 5  . Turmeric Curcumin 500 MG CAPS Take 1 tablet by mouth daily.    . Magnesium 250 MG TABS Take by mouth daily.    Marland Kitchen pyridoxine (B-6) 100 MG tablet Take 100 mg by mouth daily.     No facility-administered medications prior to visit.      Allergies:   Nitroglycerin; Ace inhibitors; Lovastatin; and Solarcaine [benzocaine]   Social History   Social History  . Marital status: Widowed    Spouse name: N/A  . Number of children: N/A  . Years of education: N/A   Occupational History  . Futures trader, Part time     Supervises drywalling an anterior ceiling work.   Social History Main Topics  . Smoking status: Former Smoker    Packs/day: 2.00    Years: 20.00    Types: Cigarettes    Quit date: 03/28/1980  . Smokeless tobacco: Never Used  . Alcohol use No  . Drug use: No  . Sexual activity: Not Asked   Other Topics Concern  . None   Social History Narrative   He walks on a relatively regular basis about 15 minutes at a time mostly to the discomfort. He previously had walked up a 30-40 minutes a time without problems. Education: [employer].     Family History:  The patient's family history includes Arthritis in his mother; COPD in his father; Cancer in his father and sister.   ROS:   Please see the history of present illness.    ROS All other systems reviewed and are negative.   PHYSICAL EXAM:   VS:  BP 110/76 (BP Location: Right Arm)   Pulse 79   Ht 5\' 8"  (1.727 m)   Wt 205 lb (93 kg)   BMI 31.17 kg/m    GEN: Well nourished, well developed, in no acute distress  HEENT: normal  Neck: no JVD, carotid bruits, or masses Cardiac: RRR; no murmurs, rubs, or gallops,no edema  Respiratory:  clear to auscultation bilaterally, normal work of breathing GI: soft, nontender, nondistended, +  BS MS: no deformity or atrophy  Skin: warm and dry, no rash Neuro:  Alert and Oriented x 3, Strength and sensation are intact Psych: euthymic mood, full affect  Wt Readings from Last 3 Encounters:  09/27/16 205 lb (93 kg)  03/16/16 208 lb (94.3 kg)  03/05/16 211 lb 9.6 oz (96 kg)      Studies/Labs Reviewed:   EKG:  EKG is ordered today.  The ekg ordered today demonstrates Normal sinus rhythm without significant ST-T  wave changes.  Recent Labs: 10/26/2015: ALT 22; BUN 23; Creat 1.08; Potassium 4.5; Sodium 140   Lipid Panel    Component Value Date/Time   CHOL 239 (H) 10/26/2015 1114   TRIG 331 (H) 10/26/2015 1114   HDL 37 (L) 10/26/2015 1114   CHOLHDL 6.5 (H) 10/26/2015 1114   VLDL 66 (H) 10/26/2015 1114   LDLCALC 136 (H) 10/26/2015 1114    Additional studies/ records that were reviewed today include:   Cath 05/23/2002 CORONARY ARTERIOGRAPHY:  There was no calcification seen on fluoroscopy.  1. Left main:  There appeared to be a very short left main or a common     ostium for both the LAD and circumflex.  2. LAD:  The LAD extended down to the apex.  The heart gave rise to three     diagonal branches.  There was TIMI-2 flow down this entire vessel. There     was no obvious intimal flap noted and there was only minor irregularity     at the third diagonal of less than 20%.  Intracoronary nitroglycerin     resulted in a slight increase in flow down the vessel but it never became     TIMI-3 flow.  3. Circumflex:  The circumflex was a large vessel that gave rise to an OM     and the PDA, all of which were free of disease.  4. Right coronary artery:  Small nondominant vessel with TIMI-2 flow also.   CONCLUSION:  1. Minimal irregularities in the left anterior descending.  2. Normal left ventricular systolic function at 13% (improvement from 2000     catheterization).    PLAN:  At this point, I cannot explain his pain from a cardiac standpoint.  Despite having relatively  slow flow it looks more like a capillary bed  problem or small vessel disease and not epicardial disease.  I cannot rule  out a aortic dissection.  Because of this he is being taken directly from  the catheterization lab to CT for an emergency chest CT to rule out aortic  dissection.    ASSESSMENT:    1. Atypical chest pain   2. Hyperlipidemia, unspecified hyperlipidemia type   3. NICM (nonischemic cardiomyopathy) ([place])      PLAN:  In order of problems listed above:  1. Atypical chest pain: Previous cardiac catheterization from February 2000 for history reviewed, see record above. EKG today did not show any ischemic changes. His symptom is very atypical and does not correlate with exertion. It only occurs as a stabbing pain that lasted a fraction of a second before going away. He has a total of 3 episodes in the last 2 month period I wish to hold off on ischemic workup at this time given the atypical nature of his symptom. He will continue to observe the symptom and let us know if he becomes more frequent or becomes more associated with exertion. Otherwise I will set him up to come back in 6 months for reevaluation.  2. Hyperlipidemia: Previous lipid panel has been reviewed, cholesterol very much uncontrolled last year. Recommend he has another fasting lipid panel this year in his primary care physician's office, and have results faxed to [place]. If still uncontrolled, I would favor initiation of statin. Although lovastatin is listed as intolerance due to nausea, he says he really does not have any previously known side effect with statin.  3. Nonischemic cardiomyopathy: Last echocardiogram in June 2011 showed EF 40-45%.  Medication Adjustments/Labs and Tests Ordered: Current medicines are reviewed at length with the patient today.  Concerns regarding medicines are outlined above.  Medication changes, Labs and Tests ordered today are listed in the Patient Instructions below. Patient  Instructions  Medication Instructions:   No changes  Labwork:   None ordered. A fasting lipid panel is recommended, when you next see your primary care physician.  Testing/Procedures:  None.  Follow-Up:  6 months with Dr. Ellyn Hack  If you need a refill on your cardiac medications before your next appointment, please call your pharmacy.      Hilbert Corrigan, [place]  09/27/2016 5:56 PM    [facility] [address], [place], [place]  [address] Phone: [phone]; Fax: [phone]

## 2016-10-04 ENCOUNTER — Ambulatory Visit (INDEPENDENT_AMBULATORY_CARE_PROVIDER_SITE_OTHER): Payer: Medicare Other | Admitting: Family Medicine

## 2016-10-04 ENCOUNTER — Encounter: Payer: Self-pay | Admitting: Family Medicine

## 2016-10-04 VITALS — BP 123/79 | HR 80 | Temp 97.6°F | Resp 16 | Ht 68.0 in | Wt 204.8 lb

## 2016-10-04 DIAGNOSIS — H9190 Unspecified hearing loss, unspecified ear: Secondary | ICD-10-CM

## 2016-10-04 DIAGNOSIS — Z23 Encounter for immunization: Secondary | ICD-10-CM

## 2016-10-04 DIAGNOSIS — Z1159 Encounter for screening for other viral diseases: Secondary | ICD-10-CM | POA: Diagnosis not present

## 2016-10-04 DIAGNOSIS — Z0000 Encounter for general adult medical examination without abnormal findings: Secondary | ICD-10-CM | POA: Diagnosis not present

## 2016-10-04 DIAGNOSIS — D224 Melanocytic nevi of scalp and neck: Secondary | ICD-10-CM

## 2016-10-04 DIAGNOSIS — E785 Hyperlipidemia, unspecified: Secondary | ICD-10-CM

## 2016-10-04 DIAGNOSIS — G6289 Other specified polyneuropathies: Secondary | ICD-10-CM

## 2016-10-04 MED ORDER — GABAPENTIN 300 MG PO CAPS: 300.0000 mg | ORAL_CAPSULE | Freq: Two times a day (BID) | ORAL | 1 refills | Status: DC | PRN

## 2016-10-04 MED ORDER — ZOSTER VAC RECOMB ADJUVANTED 50 MCG/0.5ML IM SUSR: 0.5000 mL | Freq: Once | INTRAMUSCULAR | 1 refills | Status: AC

## 2016-10-04 NOTE — Patient Instructions (Addendum)
I do recommend having a hearing test performed to see if there is sufficient decline in your hearing. I'm happy to refer you to an audiologist if needed, but [facility] may also have a free screening that you can start with.  Okay to continue gabapentin for the nerve pain in the arms or legs. If you do have abdominal cramping that persists, please follow-up to discuss that further.  I will check your cholesterol levels today, but if those are elevated your cardiologist did recommend starting a statin medication. We should have those results within the next 1-2 weeks.  Please call your dermatologist to evaluate the area on your scalp.  I sent the shingles vaccine to your pharmacy, but she can check into the cost to have that performed.  Keeping you healthy  Get these tests  Blood pressure- Have your blood pressure checked once a year by your healthcare provider.  Normal blood pressure is 120/80  Weight- Have your body mass index (BMI) calculated to screen for obesity.  BMI is a measure of body fat based on height and weight. You can also calculate your own BMI at ViewBanking.[email or web address].  Cholesterol- Have your cholesterol checked every year.  Diabetes- Have your blood sugar checked regularly if you have high blood pressure, high cholesterol, have a family history of diabetes or if you are overweight.  Screening for Colon Cancer- Colonoscopy starting at age 67.  Screening may begin sooner depending on your family history and other health conditions. Follow up colonoscopy as directed by your Gastroenterologist.  Screening for Prostate Cancer- Both blood work (PSA) and a rectal exam help screen for Prostate Cancer.  Screening begins at age 28 with African-American men and at age 30 with Caucasian men.  Screening may begin sooner depending on your family history.  Take these medicines  Aspirin- One aspirin daily can help prevent Heart disease and Stroke.  Flu shot- Every fall.  Tetanus-  Every 10 years.  Zostavax- Once after the age of 3 to prevent Shingles.  Pneumonia shot- Once after the age of 42; if you are younger than 27, ask your healthcare provider if you need a Pneumonia shot.  Take these steps  Don't smoke- If you do smoke, talk to your doctor about quitting.  For tips on how to quit, go to www.smokefree.gov or call 1-800-QUIT-NOW.  Be physically active- Exercise 5 days a week for at least 30 minutes.  If you are not already physically active start slow and gradually work up to 30 minutes of moderate physical activity.  Examples of moderate activity include walking briskly, mowing the yard, dancing, swimming, bicycling, etc.  Eat a healthy diet- Eat a variety of healthy food such as fruits, vegetables, low fat milk, low fat cheese, yogurt, lean meant, poultry, fish, beans, tofu, etc. For more information go to www.thenutritionsource.org  Drink alcohol in moderation- Limit alcohol intake to less than two drinks a day. Never drink and drive.  Dentist- Brush and floss twice daily; visit your dentist twice a year.  Depression- Your emotional health is as important as your physical health. If you're feeling down, or losing interest in things you would normally enjoy please talk to your healthcare provider.  Eye exam- Visit your eye doctor every year.  Safe sex- If you may be exposed to a sexually transmitted infection, use a condom.  Seat belts- Seat belts can save your life; always wear one.  Smoke/Carbon Monoxide detectors- These detectors need to be installed on the appropriate level of  your home.  Replace batteries at least once a year.  Skin cancer- When out in the sun, cover up and use sunscreen 15 SPF or higher.  Violence- If anyone is threatening you, please tell your healthcare provider.  Living Will/ Health care power of attorney- Speak with your healthcare provider and family.  IF you received an x-ray today, you will receive an invoice from  [facility]. Please contact [facility] at [phone] with questions or concerns regarding your invoice.   IF you received labwork today, you will receive an invoice from [facility]. Please contact [facility] at [phone] with questions or concerns regarding your invoice.   Our billing staff will not be able to assist you with questions regarding bills from these companies.  You will be contacted with the lab results as soon as they are available. The fastest way to get your results is to activate your My Chart account. Instructions are located on the last page of this paperwork. If you have not heard from [place] regarding the results in 2 weeks, please contact this office.

## 2016-10-04 NOTE — Progress Notes (Signed)
By signing my name below, I, Mesha Guinyard, attest that this documentation has been prepared under the direction and in the presence of [name], MD.  Electronically Signed: Verlee Monte, Medical Scribe. 10/04/16. 12:31 PM.  Subjective:    Patient ID: [name] [name], male    DOB: 1945-11-15, 71 y.o.   MRN: [id number]  HPI Chief Complaint  Patient presents with  . Annual Exam   HPI Comments: [name] [name] is a 71 y.o. male with a PMHx of NICM, insomnia, hypertriglyceridemia, and elevated PSA who presents to Primary Care at [facility] for his complete physical.  HLD: He is not on medication. He takes omega-3 fatty acids, garlic, and other supplements.  NICM: Cardiologist is Dr. Ellyn Hack. Most recently seen July 3rd. He was having some sharp atypical chest pain at that time. Not thought to be cardiac. Planned for 6 month follow-up. Last echo 2011, Ef 40-45%.  Cancer screening: Prostate: He has a history of elevated PSA,  ranged between 5-10 over the last few years. Has been followed by [facility], Dr. Risa Grill. He's had 3 negative biopsies over the years. Plan on follow-up Sept 2018. No results found for: PSA. Colon: Colonoscopy Feb 2017 Dr. Ardis Hughs. 1 polyp removed, tubular adenoma. Planned for repeat in 5 years. Skin: Pt last saw his dermatologist a couple of months ago. He's noticed a growing lump on his right upper scalp for years now.    Immunizations: Pt agrees to get his shingrix vaccine. Immunization History  Administered Date(s) Administered  . Hepatitis B 01/14/2011  . Influenza Split 01/14/2011, 01/18/2012  . Influenza,inj,Quad PF,36+ Mos 02/04/2013, 12/26/2013, 01/23/2015, 01/22/2016  . Pneumococcal Conjugate-13 05/06/2014  . Pneumococcal Polysaccharide-23 03/28/2004, 04/29/2009  . Tdap 08/11/2009  . Zoster 06/11/2013   Hep C Screening: Pt agrees to screening.  Peripheral Neuroapthy: Uses gabapentin 300 mg 1-2 BID, PRN Pt feels cramping in his stomach, and  thighs, legs. He notes when his cramping is bad he can't put on shoes. If he knows he has to be active that day, he finds relief of his sxs with 2 pills TID (morning, lunch, and dinner). Pt would like to get a refill.  Vision: Pt saw seen Dr. Katy Fitch last Dec when he woke up seeing flashes of light. 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 His sxs have discontinued and he hasn't had any worsening sxs since.  Visual Acuity Screening   Right eye Left eye Both eyes  Without correction:     With correction: 20/25 2025 20/30   Dentist: Pt has not been seen in years but plans on scheduling an appt.  Exercise/Activity: Pt has been going house work and cutting wood.   Advance Directive: He isn't sure if he has one.  Fall Screening: Fall Risk  10/04/2016 03/16/2016 12/09/2015 09/02/2015 04/27/2015  Falls in the past year? No No No No No  Injury with Fall? - - - - No   Functional Status Survey: Hearing: Notes it's not acute and knows he could hear better. He hasn't had a hearing test and isn't interested in getting his hearing screened. Is the patient deaf or have difficulty hearing?: Yes Does the patient have difficulty seeing, even when wearing glasses/contacts?: No Does the patient have difficulty concentrating, remembering, or making decisions?: No Does the patient have difficulty walking or climbing stairs?: No Does the patient have difficulty dressing or bathing?: No Does the patient have difficulty doing errands alone such as visiting a doctor's office  or shopping?: No   Depression Screening: Depression screen [facility] 2/9 10/04/2016 03/16/2016 12/09/2015 09/02/2015 04/27/2015  Decreased Interest 0 0 0 0 0  Down, Depressed, Hopeless 0 0 0 1 0  PHQ - 2 Score 0 0 0 1 0   Patient Active Problem List   Diagnosis Date Noted  . Insomnia 09/14/2015  . Hyperlipidemia with target LDL less than 130 09/11/2014  . Rotator cuff tendinitis 04/28/2014  . Obesity (BMI 30-39.9) 12/29/2013  .  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  . Exertional shortness of breath 06/26/2012  . Polyp of colon, adenomatous 04/27/2012  . Elevated PSA 10/20/2011  . Hypertriglyceridemia 07/08/2011  . Nonischemic cardiomyopathy ([place]) 09/04/2009   Past Medical History:  Diagnosis Date  . Arthritis   . BPH (benign prostatic hypertrophy)   . Coronary atherosclerosis of native coronary vessel    CATH 2004--  MINIMAL NONOBSTRUCTIVE LAD/  NORMAL EF/    CARDIAC CT  03/2008  NO SIGNIFICANT DISEASE  AND  nlef  . Elevated PSA   . Eye problem 03/06/2016   [name]   . History of gout    pt 05-20-2013 states stable   . History of melanoma excision    2011-- LEFT UPPER BACK  . Hyperlipemia   . Hypertriglyceridemia   . Melanoma ([place]) 2011   back  . Nonischemic cardiomyopathy ([place])    09-04-2009  --  2D echo-EF 40-45%, Global Hypokinesis  . Personal history of arthritis   . Personal history of other diseases of circulatory system    Past Surgical History:  Procedure Laterality Date  . APPENDECTOMY  AS CHILD  . CARDIAC CATHETERIZATION  05/23/2002  &  05-23-1998   DR AL LITTLE   minimal irregularities in the LAD/  EF 50%  . CARDIOVASCULAR STRESS TEST  10-05-1998   MILD GLOBAL HYPOKINESIS AND ISCHEMIAIN ANTEROSEPTAL  AT [facility]/ EF 42%  . EXCISION MELANOMA LEFT UPPER BACK  10-14-2009  . LUMBAR DISC SURGERY  09-12-2000   left  L5 -- S1  . PROSTATE BIOPSY N/A 05/22/2013   Procedure: BIOPSY TRANSRECTAL ULTRASONIC PROSTATE (TUBP);  Surgeon: [name], MD;  Location[facility];  Service: Urology;  Laterality: N/A;  . ROTATOR CUFF REPAIR Right 2016  . TRANSRECTAL ULTRASOUND PROSTATE BX  05-23-2005  &  04-19-2001  . TRANSTHORACIC ECHOCARDIOGRAM  09-04-2009   MILD LVH/  MILD GLOBAL HYPOKINESIS/ LVEF 40-45%/  MILD AV SCLEROSIS WITHOUT STENOSIS/  MILD PVR   Allergies  Allergen Reactions  . Nitroglycerin Anaphylaxis    "Cardiologist suggested he shouldn't take this med because it  could kill him with his heart condition. Lowers BP"  . Ace Inhibitors Swelling  . Lovastatin Nausea Only  . Solarcaine [Benzocaine] Dermatitis   Prior to Admission medications   Medication Sig Start Date End Date Taking? Authorizing Provider  aspirin 325 MG tablet Take 325 mg by mouth daily.   Yes [provider]  fish oil-omega-3 fatty acids 1000 MG capsule Take 1 g by mouth daily.   Yes [provider]  gabapentin (NEURONTIN) 300 MG capsule Take 1-2 capsules (300-600 mg total) by mouth 2 (two) times daily as needed. Patient taking differently: Take 300-600 mg by mouth 2 (two) times daily as needed (pain).  10/22/15  Yes [name], MD  Garlic TABS Take 1 tablet by mouth daily.   Yes [provider]  Multiple Vitamins-Minerals (MULTIVITAMIN PO) Take 1 tablet by mouth daily.   Yes [provider]  polyvinyl  alcohol (LIQUIFILM TEARS) 1.4 % ophthalmic solution Place 1 drop into both eyes daily as needed for dry eyes.   Yes [provider]  sildenafil (VIAGRA) 100 MG tablet 1/2 to 1 tablet up to each day as needed. Patient taking differently: Take 50-100 mg by mouth daily as needed for erectile dysfunction.  07/08/11  Yes [name], MD  Turmeric Curcumin 500 MG CAPS Take 1 tablet by mouth daily.   Yes [provider]  naproxen sodium (ANAPROX) 220 MG tablet Take 440 mg by mouth 2 (two) times daily as needed (pain).    [provider]   Social History   Social History  . Marital status: Widowed    Spouse name: N/A  . Number of children: N/A  . Years of education: N/A   Occupational History  . Futures trader, Part time     Supervises drywalling an anterior ceiling work.   Social History Main Topics  . Smoking status: Former Smoker    Packs/day: 2.00    Years: 20.00    Types: Cigarettes    Quit date: 03/28/1980  . Smokeless tobacco: Never Used  . Alcohol use No  . Drug use: No  . Sexual activity: Not  on file   Other Topics Concern  . Not on file   Social History Narrative   He walks on a relatively regular basis about 15 minutes at a time mostly to the discomfort. He previously had walked up a 30-40 minutes a time without problems. Education: [employer].   Review of Systems  13 poin ROS negative. Objective:  Physical Exam  Constitutional: He is oriented to person, place, and time. He appears well-developed and well-nourished.  HENT:  Head: Normocephalic and atraumatic.  Right Ear: External ear normal.  Left Ear: External ear normal.  Mouth/Throat: Oropharynx is clear and moist.  Eyes: Conjunctivae and EOM are normal. Pupils are equal, round, and reactive to light.  Neck: Normal range of motion. Neck supple. No thyromegaly present.  Cardiovascular: Normal rate, regular rhythm, normal heart sounds and intact distal pulses.   Pulmonary/Chest: Effort normal and breath sounds normal. No respiratory distress. He has no wheezes.  Abdominal: Soft. He exhibits no distension. There is no tenderness.  Musculoskeletal: Normal range of motion. He exhibits no edema or tenderness.  Lymphadenopathy:    He has no cervical adenopathy.  Neurological: He is alert and oriented to person, place, and time. He has normal reflexes.  Skin: Skin is warm and dry.  1 cm elevated slightly hyperpigmented lesion on right parietal scalp with some excoriation  Psychiatric: He has a normal mood and affect. His behavior is normal.  Vitals reviewed.   Vitals:   10/04/16 1135  BP: 123/79  Pulse: 80  Resp: 16  Temp: 97.6 F (36.4 C)  TempSrc: Oral  SpO2: 96%  Weight: 204 lb 12.8 oz (92.9 kg)  Height: 5\' 8"  (1.727 m)   Body mass index is 31.14 kg/m. Assessment & Plan:   [name] [name] is a 71 y.o. male Medicare annual wellness visit, subsequent  - anticipatory guidance as below in AVS, screening labs if needed. Health maintenance items as above in HPI discussed/recommended as applicable.   - no  concerning responses on depression, fall, or functional status screening. Any positive responses noted as above. Advanced directives discussed as in CHL.   Need for shingles vaccine - Plan: Zoster Vac Recomb Adjuvanted [facility]) injection  - hx of Zostavax, but will Rx Shingrix.  Hyperlipidemia, unspecified hyperlipidemia type - Plan: Comprehensive metabolic panel, Lipid panel  - Check lipids, recommended to be on statin if elevated based on cardiology recommendations. He did request copy sent to cardiology, but they are also in CHL.  Hearing difficulty, unspecified laterality  - Declined referral to audiologist. Recommended eye exam or screening including community resources. Advised let me know if he would like a referral.  Need for hepatitis C screening test - Plan: Hepatitis C antibody  Other polyneuropathy - Plan: gabapentin (NEURONTIN) 300 MG capsule  - Stable with Neurontin intermittent dosing. Advised if abdominal cramping, to follow-up and discussed the symptoms further as not typical neuropathic symptoms. Will check electrolytes on CMP.  Nevus of scalp  - History of skin cancer, advised to call his dermatologist for follow-up and evaluation of this lesion as differential includes basal versus squamous cell cancer. Understanding expressed  Meds ordered this encounter  Medications  . Zoster Vac Recomb Adjuvanted [facility]) injection    Sig: Inject 0.5 mLs into the muscle once. Repeat injection once in 2-6 months.    Dispense:  0.5 mL    Refill:  1  . gabapentin (NEURONTIN) 300 MG capsule    Sig: Take 1-2 capsules (300-600 mg total) by mouth 2 (two) times daily as needed.    Dispense:  360 capsule    Refill:  1   Patient Instructions   I do recommend having a hearing test performed to see if there is sufficient decline in your hearing. I'm happy to refer you to an audiologist if needed, but [facility] may also have a free screening that you can start with.  Okay to continue  gabapentin for the nerve pain in the arms or legs. If you do have abdominal cramping that persists, please follow-up to discuss that further.  I will check your cholesterol levels today, but if those are elevated your cardiologist did recommend starting a statin medication. We should have those results within the next 1-2 weeks.  Please call your dermatologist to evaluate the area on your scalp.  I sent the shingles vaccine to your pharmacy, but she can check into the cost to have that performed.  Keeping you healthy  Get these tests  Blood pressure- Have your blood pressure checked once a year by your healthcare provider.  Normal blood pressure is 120/80  Weight- Have your body mass index (BMI) calculated to screen for obesity.  BMI is a measure of body fat based on height and weight. You can also calculate your own BMI at ViewBank[employer].[email or web address].  Cholesterol- Have your cholesterol checked every year.  Diabetes- Have your blood sugar checked regularly if you have high blood pressure, high cholesterol, have a family history of diabetes or if you are overweight.  Screening for Colon Cancer- Colonoscopy starting at age 12.  Screening may begin sooner depending on your family history and other health conditions. Follow up colonoscopy as directed by your Gastroenterologist.  Screening for Prostate Cancer- Both blood work (PSA) and a rectal exam help screen for Prostate Cancer.  Screening begins at age 62 with African-American men and at age 53 with Caucasian men.  Screening may begin sooner depending on your family history.  Take these medicines  Aspirin- One aspirin daily can help prevent Heart disease and Stroke.  Flu shot- Every fall.  Tetanus- Every 10 years.  Zostavax- Once after the age of 41 to prevent Shingles.  Pneumonia shot- Once after the age of 58; if you are younger than 46, ask  your healthcare provider if you need a Pneumonia shot.  Take these steps  Don't smoke- If  you do smoke, talk to your doctor about quitting.  For tips on how to quit, go to www.smokefree.gov or call 1-800-QUIT-NOW.  Be physically active- Exercise 5 days a week for at least 30 minutes.  If you are not already physically active start slow and gradually work up to 30 minutes of moderate physical activity.  Examples of moderate activity include walking briskly, mowing the yard, dancing, swimming, bicycling, etc.  Eat a healthy diet- Eat a variety of healthy food such as fruits, vegetables, low fat milk, low fat cheese, yogurt, lean meant, poultry, fish, beans, tofu, etc. For more information go to www.thenutritionsource.org  Drink alcohol in moderation- Limit alcohol intake to less than two drinks a day. Never drink and drive.  Dentist- Brush and floss twice daily; visit your dentist twice a year.  Depression- Your emotional health is as important as your physical health. If you're feeling down, or losing interest in things you would normally enjoy please talk to your healthcare provider.  Eye exam- Visit your eye doctor every year.  Safe sex- If you may be exposed to a sexually transmitted infection, use a condom.  Seat belts- Seat belts can save your life; always wear one.  Smoke/Carbon Monoxide detectors- These detectors need to be installed on the appropriate level of your home.  Replace batteries at least once a year.  Skin cancer- When out in the sun, cover up and use sunscreen 15 SPF or higher.  Violence- If anyone is threatening you, please tell your healthcare provider.  Living Will/ Health care power of attorney- Speak with your healthcare provider and family.  IF you received an x-ray today, you will receive an invoice from [facility]. Please contact [facility] at [phone] with questions or concerns regarding your invoice.   IF you received labwork today, you will receive an invoice from [place]. Please contact [facility] at [phone] with  questions or concerns regarding your invoice.   Our billing staff will not be able to assist you with questions regarding bills from these companies.  You will be contacted with the lab results as soon as they are available. The fastest way to get your results is to activate your My Chart account. Instructions are located on the last page of this paperwork. If you have not heard from [place] regarding the results in 2 weeks, please contact this office.       I personally performed the services described in this documentation, which was scribed in my presence. The recorded information has been reviewed and considered for accuracy and completeness, addended by me as needed, and agree with information above.  Signed,   [name], MD Primary Care at [place].  10/04/16 2:02 PM

## 2016-10-05 LAB — LIPID PANEL
Chol/HDL Ratio: 5.7 ratio — ABNORMAL HIGH (ref 0.0–5.0)
Cholesterol, Total: 212 mg/dL — ABNORMAL HIGH (ref 100–199)
HDL: 37 mg/dL — ABNORMAL LOW (ref >39)
LDL Calculated: 127 mg/dL — ABNORMAL HIGH (ref 0–99)
Triglycerides: 241 mg/dL — ABNORMAL HIGH (ref 0–149)
VLDL Cholesterol Cal: 48 mg/dL — ABNORMAL HIGH (ref 5–40)

## 2016-10-05 LAB — COMPREHENSIVE METABOLIC PANEL
ALT: 28 IU/L (ref 0–44)
AST: 24 IU/L (ref 0–40)
Albumin/Globulin Ratio: 1.8 (ref 1.2–2.2)
Albumin: 4.6 g/dL (ref 3.5–4.8)
Alkaline Phosphatase: 64 IU/L (ref 39–117)
BUN/Creatinine Ratio: 14 (ref 10–24)
BUN: 15 mg/dL (ref 8–27)
Bilirubin Total: 0.3 mg/dL (ref 0.0–1.2)
CO2: 24 mmol/L (ref 20–29)
Calcium: 9.7 mg/dL (ref 8.6–10.2)
Chloride: 103 mmol/L (ref 96–106)
Creatinine, Ser: 1.04 mg/dL (ref 0.76–1.27)
GFR calc Af Amer: 83 mL/min/{1.73_m2} (ref >59)
GFR calc non Af Amer: 71 mL/min/{1.73_m2} (ref >59)
Globulin, Total: 2.6 g/dL (ref 1.5–4.5)
Glucose: 103 mg/dL — ABNORMAL HIGH (ref 65–99)
Potassium: 4.8 mmol/L (ref 3.5–5.2)
Sodium: 142 mmol/L (ref 134–144)
Total Protein: 7.2 g/dL (ref 6.0–8.5)

## 2016-10-05 LAB — HEPATITIS C ANTIBODY: Hep C Virus Ab: <0.1 s/co ratio (ref 0.0–0.9)

## 2016-10-06 DIAGNOSIS — L820 Inflamed seborrheic keratosis: Secondary | ICD-10-CM | POA: Diagnosis not present

## 2016-10-25 ENCOUNTER — Telehealth: Payer: Self-pay | Admitting: Family Medicine

## 2016-10-25 ENCOUNTER — Telehealth: Payer: Self-pay | Admitting: Cardiology

## 2016-10-25 NOTE — Telephone Encounter (Signed)
PT CALLED ABOUT RX GABAPENTIN I ADVISED HIM THAT RX WENT TO [facility] ON 10-04-16 HE STATES THAT IT SHOULD'VE WENT TO [facility] HE'S BEEN WAITING ON RX FOR A WHILE

## 2016-10-25 NOTE — Telephone Encounter (Signed)
Will let dr harding know labs are resulted in [facility]

## 2016-10-25 NOTE — Telephone Encounter (Signed)
New message     Please check the lab results in his chart, he had them done with his PCP

## 2016-10-26 NOTE — Telephone Encounter (Signed)
Call placed to patient to see if he would like Rx sent to [facility], or if he picked up medication at [facility]. No answer on patient phone. Left message for patient to return call and let office know if he would like Rx re-sent to [facility] S.Kooper Godshall,CMA

## 2016-10-27 ENCOUNTER — Other Ambulatory Visit: Payer: Self-pay

## 2016-10-27 ENCOUNTER — Telehealth: Payer: Self-pay | Admitting: Family Medicine

## 2016-10-27 DIAGNOSIS — G6289 Other specified polyneuropathies: Secondary | ICD-10-CM

## 2016-10-27 MED ORDER — GABAPENTIN 300 MG PO CAPS: 300.0000 mg | ORAL_CAPSULE | Freq: Two times a day (BID) | ORAL | 1 refills | Status: DC | PRN

## 2016-10-27 NOTE — Telephone Encounter (Signed)
Medication sent to [facility] at this time

## 2016-10-27 NOTE — Telephone Encounter (Signed)
Please advise. dg 

## 2016-10-27 NOTE — Telephone Encounter (Signed)
appears this was sent today to [facility].

## 2016-10-27 NOTE — Telephone Encounter (Signed)
PT CALLING WANTING HIS PRESCRIPTION GABAPENTIN SENT TO [facility] HES BEEN WAITING ON IT TO BE SENT THERE SINCE HE CALLED ON 10-25-16 I SPOKE WITH SHERESE ABOUT THIS

## 2016-11-21 DIAGNOSIS — L820 Inflamed seborrheic keratosis: Secondary | ICD-10-CM | POA: Diagnosis not present

## 2016-12-12 DIAGNOSIS — R972 Elevated prostate specific antigen [PSA]: Secondary | ICD-10-CM | POA: Diagnosis not present

## 2016-12-16 ENCOUNTER — Encounter: Payer: Self-pay | Admitting: Family Medicine

## 2016-12-16 ENCOUNTER — Ambulatory Visit (INDEPENDENT_AMBULATORY_CARE_PROVIDER_SITE_OTHER): Payer: Medicare Other | Admitting: Family Medicine

## 2016-12-16 VITALS — BP 127/64 | HR 77 | Temp 97.8°F | Resp 16 | Ht 68.5 in | Wt 201.2 lb

## 2016-12-16 DIAGNOSIS — L02519 Cutaneous abscess of unspecified hand: Secondary | ICD-10-CM | POA: Diagnosis not present

## 2016-12-16 DIAGNOSIS — Z23 Encounter for immunization: Secondary | ICD-10-CM

## 2016-12-16 MED ORDER — DOXYCYCLINE HYCLATE 100 MG PO CAPS: 100.0000 mg | ORAL_CAPSULE | Freq: Two times a day (BID) | ORAL | 0 refills | Status: DC

## 2016-12-16 NOTE — Patient Instructions (Addendum)
Take doxycycline 100 mg one twice daily for infection for one week. Be cautious when on doxycycline because you can sunburn easier.  If the area is getting worse rather than better please return for recheck at any time.  You have received your flu vaccine today.    IF you received an x-ray today, you will receive an invoice from [facility]. Please contact [facility]iology at [phone] with questions or concerns regarding your invoice.   IF you received labwork today, you will receive an invoice from [place]. Please contact [facility] at [phone] with questions or concerns regarding your invoice.   Our billing staff will not be able to assist you with questions regarding bills from these companies.  You will be contacted with the lab results as soon as they are available. The fastest way to get your results is to activate your My Chart account. Instructions are located on the last page of this paperwork. If you have not heard from [place] regarding the results in 2 weeks, please contact this office.

## 2016-12-16 NOTE — Progress Notes (Signed)
Patient ID: CLAUDIE RATHBONE, male    DOB: [birth year]  Age: 71 y.o. MRN: [id number]  Chief Complaint  Patient presents with  . Insect Bite    LEFT pinky finger - per patient maybe a spider x 5 days    Subjective:   71 year old patient who comes in with a red infected lesion on his left fifth finger. He had been working in the yard last week, and about tics days ago he got up and noticed a little red area on his left fifth finger. It was not seeming to go away completely with time, and had developed a little abscess looking area that drained centrally for a while. He came on in to get looked at today.  He also needs flu shot today.   Current allergies, medications, problem list, past/family and social histories reviewed.  Objective:  BP 127/64 (BP Location: Right Arm, Patient Position: Sitting, Cuff Size: Large)   Pulse 77   Temp 97.8 F (36.6 C) (Oral)   Resp 16   Ht 5' 8.5" (1.74 m)   Wt 201 lb 3.2 oz (91.3 kg)   SpO2 95%   BMI 30.15 kg/m   Pleasant gentleman, alert and oriented. 2 cm area of erythema just distal to the left fifth MCP joint on the dorsum of the hand. There is a central area of yellowish scab/eschar about 5 mm in diameter, and the halo rim around it. Ethyl chloride was used to slightly anesthetize the area and I&D was done with 18-gauge needle. Culture taken. No pus came out, just blood.  Assessment & Plan:   Assessment: 1. Need for prophylactic vaccination and inoculation against influenza   2. Abscess of little finger       Plan: This little superficial abscess could be from an insect bite. He does not know specifically of having been bitten by anything but he spent a lot of time working outdoors in his yard and bushes. Had not been in crawl spaces are places that would seem unlikely for brown recluse. He has not had intense pain from it. I suspect this is just a little puncture wound that got abscess. It seems to already be on demand.  Will treat with  doxycycline.  Orders Placed This Encounter  Procedures  . WOUND CULTURE    Order Specific Question:   Source    Answer:   left 5th finger  . Flu Vaccine QUAD 36+ mos IM    Meds ordered this encounter  Medications  . doxycycline (VIBRAMYCIN) 100 MG capsule    Sig: Take 1 capsule (100 mg total) by mouth 2 (two) times daily.    Dispense:  14 capsule    Refill:  0         Patient Instructions   Take doxycycline 100 mg one twice daily for infection for one week. Be cautious when on doxycycline because you can sunburn easier.  If the area is getting worse rather than better please return for recheck at any time.  You have received your flu vaccine today.    IF you received an x-ray today, you will receive an invoice from [facility]. Please contact [facility]iology at [phone] with questions or concerns regarding your invoice.   IF you received labwork today, you will receive an invoice from [place]. Please contact [facility] at [phone] with questions or concerns regarding your invoice.   Our billing staff will not be able to assist you with questions regarding bills from these companies.  You  will be contacted with the lab results as soon as they are available. The fastest way to get your results is to activate your My Chart account. Instructions are located on the last page of this paperwork. If you have not heard from [place] regarding the results in 2 weeks, please contact this office.        No Follow-up on file.   Mckensi Redinger, MD 12/16/2016

## 2016-12-19 DIAGNOSIS — N5201 Erectile dysfunction due to arterial insufficiency: Secondary | ICD-10-CM | POA: Diagnosis not present

## 2016-12-19 DIAGNOSIS — R972 Elevated prostate specific antigen [PSA]: Secondary | ICD-10-CM | POA: Diagnosis not present

## 2016-12-19 LAB — WOUND CULTURE: Organism ID, Bacteria: NONE SEEN

## 2017-01-24 DIAGNOSIS — M25562 Pain in left knee: Secondary | ICD-10-CM | POA: Diagnosis not present

## 2017-01-24 DIAGNOSIS — Z79899 Other long term (current) drug therapy: Secondary | ICD-10-CM | POA: Insufficient documentation

## 2017-01-24 DIAGNOSIS — Z7982 Long term (current) use of aspirin: Secondary | ICD-10-CM | POA: Insufficient documentation

## 2017-01-24 DIAGNOSIS — Z87891 Personal history of nicotine dependence: Secondary | ICD-10-CM | POA: Insufficient documentation

## 2017-01-25 ENCOUNTER — Emergency Department (HOSPITAL_COMMUNITY)
Admission: EM | Admit: 2017-01-25 | Discharge: 2017-01-25 | Disposition: A | Discharge status: Home / Self Care | Payer: Medicare Other | Attending: Emergency Medicine | Admitting: Emergency Medicine

## 2017-01-25 ENCOUNTER — Emergency Department (HOSPITAL_COMMUNITY): Payer: Medicare Other

## 2017-01-25 ENCOUNTER — Encounter (HOSPITAL_COMMUNITY): Payer: Self-pay

## 2017-01-25 DIAGNOSIS — M25562 Pain in left knee: Secondary | ICD-10-CM | POA: Diagnosis not present

## 2017-01-25 MED ORDER — HYDROCODONE-ACETAMINOPHEN 5-325 MG PO TABS
2.0000 | ORAL_TABLET | Freq: Once | ORAL | Status: AC
  Administered 2017-01-25: 2 via ORAL
  Filled 2017-01-25: qty 2

## 2017-01-25 MED ORDER — HYDROCODONE-ACETAMINOPHEN 5-325 MG PO TABS
1.0000 | ORAL_TABLET | Freq: Four times a day (QID) | ORAL | 0 refills | Status: DC | PRN
Start: 2017-01-25 — End: 2017-01-26

## 2017-01-25 NOTE — ED Triage Notes (Signed)
Pt states he felt his left knee pop this evening, there is some swelling and he states its painful

## 2017-01-25 NOTE — ED Provider Notes (Signed)
[facility] DEPT Provider Note   CSN: [id number] Arrival date & time: 01/24/17  2339    History   Chief Complaint Chief Complaint  Patient presents with  . Knee Pain    HPI [name] [name] is a 71 y.o. male.  71 year old male with a history of cardiomyopathy, dyslipidemia, CAD, and arthritis presents to the emergency department for left knee pain.  He states that he has been experiencing knee pain intermittently over the past few days.  He has been wearing a knee sleeve for stability as he has felt his knee "shift" at times.  He states that he was trying to apply his knee sleeve tonight when his knee twisted with a "cracking" sound.  He reports acute onset of pain at this time.  He has been unable to weight-bear secondary to discomfort.  He has taken Aleve for symptoms which has provided him no relief.  Patient denies any fall or direct trauma to his knee.  He has been followed by Dr. Onnie Graham of Orthopedics in the past.      Past Medical History:  Diagnosis Date  . Arthritis   . BPH (benign prostatic hypertrophy)   . Coronary atherosclerosis of native coronary vessel    CATH 2004--  MINIMAL NONOBSTRUCTIVE LAD/  NORMAL EF/    CARDIAC CT  03/2008  NO SIGNIFICANT DISEASE  AND  nlef  . Elevated PSA   . Eye problem 03/06/2016   [name]   . History of gout    pt 05-20-2013 states stable   . History of melanoma excision    2011-- LEFT UPPER BACK  . Hyperlipemia   . Hypertriglyceridemia   . Melanoma ([place]) 2011   back  . Nonischemic cardiomyopathy ([place])    09-04-2009  --  2D echo-EF 40-45%, Global Hypokinesis  . Personal history of arthritis   . Personal history of other diseases of circulatory system     Patient Active Problem List   Diagnosis Date Noted  . Insomnia 09/14/2015  . Hyperlipidemia with target LDL less than 130 09/11/2014  . Rotator cuff tendinitis 04/28/2014  . Obesity (BMI 30-39.9) 12/29/2013  . Osteoarthritis of cervical spine  12/19/2013  . Exertional shortness of breath 06/26/2012  . Polyp of colon, adenomatous 04/27/2012  . Elevated PSA 10/20/2011  . Hypertriglyceridemia 07/08/2011  . Nonischemic cardiomyopathy ([place]) 09/04/2009    Past Surgical History:  Procedure Laterality Date  . APPENDECTOMY  AS CHILD  . CARDIAC CATHETERIZATION  05/23/2002  &  05-23-1998   DR AL LITTLE   minimal irregularities in the LAD/  EF 50%  . CARDIOVASCULAR STRESS TEST  10-05-1998   MILD GLOBAL HYPOKINESIS AND ISCHEMIAIN ANTEROSEPTAL  AT [facility]/ EF 42%  . EXCISION MELANOMA LEFT UPPER BACK  10-14-2009  . LUMBAR DISC SURGERY  09-12-2000   left  L5 -- S1  . PROSTATE BIOPSY N/A 05/22/2013   Procedure: BIOPSY TRANSRECTAL ULTRASONIC PROSTATE (TUBP);  Surgeon: [name], MD;  Location: [facility];  Service: Urology;  Laterality: N/A;  . ROTATOR CUFF REPAIR Right 2016  . TRANSRECTAL ULTRASOUND PROSTATE BX  05-23-2005  &  04-19-2001  . TRANSTHORACIC ECHOCARDIOGRAM  09-04-2009   MILD LVH/  MILD GLOBAL HYPOKINESIS/ LVEF 40-45%/  MILD AV SCLEROSIS WITHOUT STENOSIS/  MILD PVR       Home Medications    Prior to Admission medications   Medication Sig Start Date End Date Taking? Authorizing Provider  aspirin 325 MG tablet Take 325 mg  by mouth daily.    [provider]  doxycycline (VIBRAMYCIN) 100 MG capsule Take 1 capsule (100 mg total) by mouth 2 (two) times daily. 12/16/16   Posey Boyer, MD  fish oil-omega-3 fatty acids 1000 MG capsule Take 1 g by mouth daily.    [provider]  gabapentin (NEURONTIN) 300 MG capsule Take 1-2 capsules (300-600 mg total) by mouth 2 (two) times daily as needed. 10/27/16   [name], MD  Garlic TABS Take 1 tablet by mouth daily.    [provider]  HYDROcodone-acetaminophen (NORCO/VICODIN) 5-325 MG tablet Take 1 tablet by mouth every 6 (six) hours as needed for severe pain. 01/25/17   Antonietta Breach, PA-C  Multiple Vitamins-Minerals (MULTIVITAMIN PO)  Take 1 tablet by mouth daily.    [provider]  naproxen sodium (ANAPROX) 220 MG tablet Take 440 mg by mouth 2 (two) times daily as needed (pain).    [provider]  polyvinyl alcohol (LIQUIFILM TEARS) 1.4 % ophthalmic solution Place 1 drop into both eyes daily as needed for dry eyes.    [provider]  sildenafil (VIAGRA) 100 MG tablet 1/2 to 1 tablet up to each day as needed. Patient taking differently: Take 50-100 mg by mouth daily as needed for erectile dysfunction.  07/08/11   [name], MD  Turmeric Curcumin 500 MG CAPS Take 1 tablet by mouth daily.    [provider]    Family History Family History  Problem Relation Age of Onset  . Arthritis Mother   . COPD Father   . Cancer Father        lung cancer  . Cancer Sister   . Cardiomyopathy Unknown        idiopathic. trivial disease 2004 cath, 2010 cardiac CT - no sig. dz, nl LV fxn. cards: Little  . Colon cancer Neg Hx     Social History Social History  Substance Use Topics  . Smoking status: Former Smoker    Packs/day: 2.00    Years: 20.00    Types: Cigarettes    Quit date: 03/28/1980  . Smokeless tobacco: Never Used  . Alcohol use No     Allergies   Nitroglycerin; Ace inhibitors; Lovastatin; and Solarcaine [benzocaine]   Review of Systems Review of Systems Ten systems reviewed and are negative for acute change, except as noted in the HPI.    Physical Exam Updated Vital Signs BP 129/72 (BP Location: Left Arm)   Pulse 69   Temp 97.7 F (36.5 C) (Oral)   Resp 20   SpO2 98%   Physical Exam  Constitutional: He is oriented to person, place, and time. He appears well-developed and well-nourished. No distress.  Nontoxic and in NAD  HENT:  Head: Normocephalic and atraumatic.  Eyes: Conjunctivae and EOM are normal. No scleral icterus.  Neck: Normal range of motion.  Cardiovascular: Normal rate, regular rhythm and intact distal pulses.   DP pulse 2+ in the LLE    Pulmonary/Chest: Effort normal. No respiratory distress.  Respirations even and unlabored  Musculoskeletal: Normal range of motion. He exhibits tenderness.  Normal ROM of the L knee, though full extension does illicit discomfort. No bony deformity, crepitus, instability. No erythema or heat to touch. Pain primarily on palpation of the posterior knee. No palpable cyst. No LLE swelling.  Neurological: He is alert and oriented to person, place, and time. He exhibits normal muscle tone. Coordination normal.  Skin: Skin is warm and dry. No rash  noted. He is not diaphoretic. No erythema. No pallor.  Psychiatric: He has a normal mood and affect. His behavior is normal.  Nursing note and vitals reviewed.    ED Treatments / Results  Labs (all labs ordered are listed, but only abnormal results are displayed) Labs Reviewed - No data to display  EKG  EKG Interpretation None       Radiology Dg Knee Complete 4 Views Left  Result Date: 01/25/2017 CLINICAL DATA:  Knee pain EXAM: LEFT KNEE - COMPLETE 4+ VIEW COMPARISON:  None. FINDINGS: No evidence of fracture, dislocation, or joint effusion. No evidence of arthropathy or other focal bone abnormality. Soft tissues are unremarkable. IMPRESSION: No fracture, dislocation or arthropathy of the left knee. Electronically Signed   By: Ulyses Jarred M.D.   On: 01/25/2017 01:40    Procedures Procedures (including critical care time)  Medications Ordered in ED Medications  HYDROcodone-acetaminophen (NORCO/VICODIN) 5-325 MG per tablet 2 tablet (2 tablets Oral Given 01/25/17 0224)     Initial Impression / Assessment and Plan / ED Course  I have reviewed the triage vital signs and the nursing notes.  Pertinent labs & imaging results that were available during my care of the patient were reviewed by me and considered in my medical decision making (see chart for details).     Patient presents to the emergency department for evaluation of L knee pain.  Patient neurovascularly intact on exam. Imaging negative for fracture, dislocation, bony deformity. Suspect ligamentous injury causing subjective instability. Most of pain is reproducible on palpation of posterior L knee. Will provide knee immobilizer and crutches for WBAT. Plan for supportive management including RICE and NSAIDs; Orthopedic follow up as needed. Short course of Norco given for management of severe pain. Return precautions discussed and provided. Patient discharged in stable condition with no unaddressed concerns.   Final Clinical Impressions(s) / ED Diagnoses   Final diagnoses:  Acute pain of left knee    New Prescriptions New Prescriptions   HYDROCODONE-ACETAMINOPHEN (NORCO/VICODIN) 5-325 MG TABLET    Take 1 tablet by mouth every 6 (six) hours as needed for severe pain.     Antonietta Breach, PA-C 01/25/17 0302    Merryl Hacker, MD 01/25/17 [phone]

## 2017-01-25 NOTE — ED Notes (Signed)
Bed: [facility]PT1 Expected date:  Expected time:  Means of arrival:  Comments: 

## 2017-01-25 NOTE — Discharge Instructions (Signed)
Wear a knee immobilizer for stability.  Use crutches to help while walking as putting full weight on your left leg may cause pain.  Take Aleve twice a day and ice your knee 3-4 times per day to limit swelling.  You may take Norco as needed for severe pain.  Follow-up with your orthopedist as soon as you are able.  Call in the morning to schedule an appointment for follow-up.

## 2017-01-26 ENCOUNTER — Ambulatory Visit (INDEPENDENT_AMBULATORY_CARE_PROVIDER_SITE_OTHER): Payer: Medicare Other | Admitting: Physician Assistant

## 2017-01-26 DIAGNOSIS — M25562 Pain in left knee: Secondary | ICD-10-CM

## 2017-01-26 MED ORDER — LIDOCAINE HCL 1 % IJ SOLN
3.0000 mL | INTRAMUSCULAR | Status: AC | PRN
  Administered 2017-01-26: 3 mL

## 2017-01-26 MED ORDER — HYDROCODONE-ACETAMINOPHEN 5-325 MG PO TABS: 1.0000 | ORAL_TABLET | Freq: Four times a day (QID) | ORAL | 0 refills | Status: DC | PRN

## 2017-01-26 MED ORDER — METHYLPREDNISOLONE ACETATE 40 MG/ML IJ SUSP
40.0000 mg | INTRAMUSCULAR | Status: AC | PRN
  Administered 2017-01-26: 40 mg via INTRA_ARTICULAR

## 2017-01-26 NOTE — Progress Notes (Signed)
Office Visit Note   Patient: [name] [name]           Date of Birth: 12/07/1945           MRN: [id number] Visit Date: 01/26/2017              Requested by: [name], MD [address] [place], [place] [address] PCP: [name], MD   Assessment & Plan: Visit Diagnoses:  1. Acute pain of left knee     Plan: I would like for him to ice the knee twice daily for 15-20 minutes.  Relative rest.  He is weightbearing as tolerated with or without the knee immobilizer.  Did write him a prescription for a rolling walker and a shower chair.  He has for refill on his hydrocodone I gave him a total of 15 tablets he is using sparingly.  We will see him back in 2 weeks to check his progress lack of.  Pain persist or if he has recurrent effusion recommend MRI to rule out internal derangement.  Follow-Up Instructions: Return in about 2 weeks (around 02/09/2017).   Orders:  Orders Placed This Encounter  Procedures  . Large Joint Injection/Arthrocentesis   Meds ordered this encounter  Medications  . HYDROcodone-acetaminophen (NORCO/VICODIN) 5-325 MG tablet    Sig: Take 1 tablet by mouth every 6 (six) hours as needed for severe pain.    Dispense:  15 tablet    Refill:  0      Procedures: Large Joint Inj Date/Time: 01/26/2017 7:09 PM Performed by: Pete Pelt Authorized by: Pete Pelt   Consent Given by:  Patient Indications:  Pain Location:  Knee Site:  L knee Needle Size:  22 G Approach:  Anterolateral Ultrasound Guidance: No   Fluoroscopic Guidance: No   Medications:  40 mg methylPREDNISolone acetate 40 MG/ML; 3 mL lidocaine 1 % Aspiration Attempted: Yes   Aspirate amount (mL):  45 Aspirate:  Blood-tinged Patient tolerance:  Patient tolerated the procedure well with no immediate complications     Clinical Data: No additional findings.   Subjective: Chief Complaint  Patient presents with  . Left Knee - Pain    HPI [name] is a 71 year old male  who was seen for the first time.  He is having left knee pain.  He states he has had on and off knee pain for years worse in a Bledsoe brace occasionally.  Occasionally the knee does flare.  When flaring up more often with some painful popping in the knee.  However he was unable to go hunting on October 29 and then on Tuesday he waking was having severe pain in the knee having difficulty ambulating due to knee pain.  On 01/25/2017 he went to the ER where radiographs of his knee were obtained.  These showed no acute fractures no significant arthritic changes however the films are somewhat suboptimal by my interpretation and are non-weightbearing.  He was given a knee immobilizer crutches.  He is having considerable pain in the knee having difficulty ambulating.  Been taking hydrocodone for the pain.  Does have a history of gout.  No diabetes. Review of Systems Please see HPI otherwise negative  Objective: Vital Signs: There were no vitals taken for this visit.  Physical Exam  Constitutional: He is oriented to person, place, and time. He appears well-developed and well-nourished. No distress.  Pulmonary/Chest: Effort normal.  Neurological: He is alert and oriented to person, place, and time.  Skin: He  is not diaphoretic.    Ortho Exam Right knee good range of motion without pain.  Left knee positive effusion no abnormal warmth or erythema.  He has tenderness along the medial joint line.  No instability of either knee with valgus varus stressing.  After aspiration injection he has some discomfort with McMurray's but still somewhat hard to assess his knee due to pain. Specialty Comments:  No specialty comments available.  Imaging: No results found.   PMFS History: Patient Active Problem List   Diagnosis Date Noted  . Insomnia 09/14/2015  . Hyperlipidemia with target LDL less than 130 09/11/2014  . Rotator cuff tendinitis 04/28/2014  . Obesity (BMI 30-39.9) 12/29/2013  . Osteoarthritis of  cervical spine 12/19/2013  . Exertional shortness of breath 06/26/2012  . Polyp of colon, adenomatous 04/27/2012  . Elevated PSA 10/20/2011  . Hypertriglyceridemia 07/08/2011  . Nonischemic cardiomyopathy ([place]) 09/04/2009   Past Medical History:  Diagnosis Date  . Arthritis   . BPH (benign prostatic hypertrophy)   . Coronary atherosclerosis of native coronary vessel    CATH 2004--  MINIMAL NONOBSTRUCTIVE LAD/  NORMAL EF/    CARDIAC CT  03/2008  NO SIGNIFICANT DISEASE  AND  nlef  . Elevated PSA   . Eye problem 03/06/2016   [name]   . History of gout    pt 05-20-2013 states stable   . History of melanoma excision    2011-- LEFT UPPER BACK  . Hyperlipemia   . Hypertriglyceridemia   . Melanoma ([place]) 2011   back  . Nonischemic cardiomyopathy ([place])    09-04-2009  --  2D echo-EF 40-45%, Global Hypokinesis  . Personal history of arthritis   . Personal history of other diseases of circulatory system     Family History  Problem Relation Age of Onset  . Arthritis Mother   . COPD Father   . Cancer Father        lung cancer  . Cancer Sister   . Cardiomyopathy Unknown        idiopathic. trivial disease 2004 cath, 2010 cardiac CT - no sig. dz, nl LV fxn. cards: Little  . Colon cancer Neg Hx     Past Surgical History:  Procedure Laterality Date  . APPENDECTOMY  AS CHILD  . CARDIAC CATHETERIZATION  05/23/2002  &  05-23-1998   DR AL LITTLE   minimal irregularities in the LAD/  EF 50%  . CARDIOVASCULAR STRESS TEST  10-05-1998   MILD GLOBAL HYPOKINESIS AND ISCHEMIAIN ANTEROSEPTAL  AT [facility]/ EF 42%  . EXCISION MELANOMA LEFT UPPER BACK  10-14-2009  . LUMBAR DISC SURGERY  09-12-2000   left  L5 -- S1  . PROSTATE BIOPSY N/A 05/22/2013   Procedure: BIOPSY TRANSRECTAL ULTRASONIC PROSTATE (TUBP);  Surgeon: [name], MD;  Location: [facility];  Service: Urology;  Laterality: N/A;  . ROTATOR CUFF REPAIR Right 2016  . TRANSRECTAL ULTRASOUND PROSTATE BX  05-23-2005  &   04-19-2001  . TRANSTHORACIC ECHOCARDIOGRAM  09-04-2009   MILD LVH/  MILD GLOBAL HYPOKINESIS/ LVEF 40-45%/  MILD AV SCLEROSIS WITHOUT STENOSIS/  MILD PVR   Social History   Occupational History  . Futures trader, Part time     Supervises drywalling an anterior ceiling work.   Social History Main Topics  . Smoking status: Former Smoker    Packs/day: 2.00    Years: 20.00    Types: Cigarettes    Quit date: 03/28/1980  . Smokeless tobacco: Never Used  .  Alcohol use No  . Drug use: No  . Sexual activity: Not on file

## 2017-02-09 ENCOUNTER — Ambulatory Visit (INDEPENDENT_AMBULATORY_CARE_PROVIDER_SITE_OTHER): Payer: Medicare Other | Admitting: Physician Assistant

## 2017-02-09 ENCOUNTER — Encounter (INDEPENDENT_AMBULATORY_CARE_PROVIDER_SITE_OTHER): Payer: Self-pay | Admitting: Physician Assistant

## 2017-02-09 DIAGNOSIS — M7050 Other bursitis of knee, unspecified knee: Secondary | ICD-10-CM

## 2017-02-09 MED ORDER — METHYLPREDNISOLONE ACETATE 40 MG/ML IJ SUSP
40.0000 mg | INTRAMUSCULAR | Status: AC | PRN
  Administered 2017-02-09: 40 mg via INTRAMUSCULAR

## 2017-02-09 MED ORDER — LIDOCAINE HCL 1 % IJ SOLN
1.0000 mL | INTRAMUSCULAR | Status: AC | PRN
  Administered 2017-02-09: 1 mL

## 2017-02-09 NOTE — Progress Notes (Signed)
   Procedure Note  Patient: [name] [name]             Date of Birth: 08-18-1945           MRN: [id number]             Visit Date: 02/09/2017  Mr. Thau returns today for follow-up of aspiration injection of his left knee 2 weeks ago.  He states that his knee was doing very well until this past Saturday he started developing a little bit of pain in the knee again.  He has taken some Aleve at night which helps some.  Pain is worse whenever he is walking or standing.  He denies any mechanical symptoms of the knee.  States pain is worse 5-6 out of 10 pain with walking.  He is noted no swelling of the knee  Physical exam left knee: No effusion abnormal warmth erythema.  No instability.  Negative McMurray's.  Anterior drawer is negative.  Tenderness over the pins and tenderness region only.  There is no rashes, ulcerations or abnormal warmth of the left knee.  Procedures: Visit Diagnoses: Pes anserine bursitis  Trigger Point Inj Date/Time: 02/09/2017 2:21 PM Performed by: Pete Pelt, PA-C Authorized by: Pete Pelt, PA-C   Consent Given by:  Parent Site marked: the procedure site was marked   Timeout: prior to procedure the correct patient, procedure, and site was verified   Indications:  Pain Total # of Trigger Points:  1 Location: lower extremity   Needle Size:  25 G Approach:  Medial Medications #1:  1 mL lidocaine 1 %; 40 mg methylPREDNISolone acetate 40 MG/ML Patient tolerance:  Patient tolerated the procedure well with no immediate complications    Plan: See him back on an as-needed basis.  However he develops any mechanical symptoms, swelling or increasing knee pain and call our office will obtain an MRI to rule out meniscal tear.

## 2017-02-15 ENCOUNTER — Telehealth (INDEPENDENT_AMBULATORY_CARE_PROVIDER_SITE_OTHER): Payer: Self-pay | Admitting: Physician Assistant

## 2017-02-15 ENCOUNTER — Other Ambulatory Visit (INDEPENDENT_AMBULATORY_CARE_PROVIDER_SITE_OTHER): Payer: Self-pay

## 2017-02-15 DIAGNOSIS — G8929 Other chronic pain: Secondary | ICD-10-CM

## 2017-02-15 DIAGNOSIS — M25562 Pain in left knee: Principal | ICD-10-CM

## 2017-02-15 NOTE — Telephone Encounter (Signed)
Patient's girlfriend [name]) called left voicemail message stating patient called her this morning asked that she call and have him set up for an MRI. I called [name] and she advised she spoke with [facility]is Delay at last visit and he is aware patient need MRI. The number to contact patient is [phone]

## 2017-02-15 NOTE — Telephone Encounter (Signed)
Order put in for MRI 

## 2017-02-20 ENCOUNTER — Telehealth (INDEPENDENT_AMBULATORY_CARE_PROVIDER_SITE_OTHER): Payer: Self-pay | Admitting: Orthopaedic Surgery

## 2017-02-20 NOTE — Telephone Encounter (Signed)
Noted and added to referral notes

## 2017-02-20 NOTE — Telephone Encounter (Signed)
Patient called to make sure that the order was put in for an Open MRI machine.  CB#[phone].  Thank you.

## 2017-02-25 ENCOUNTER — Ambulatory Visit
Admission: RE | Admit: 2017-02-25 | Discharge: 2017-02-25 | Disposition: A | Discharge status: Home / Self Care | Payer: Medicare Other | Source: Ambulatory Visit | Attending: Orthopaedic Surgery | Admitting: Orthopaedic Surgery

## 2017-02-25 DIAGNOSIS — M25562 Pain in left knee: Secondary | ICD-10-CM | POA: Diagnosis not present

## 2017-02-25 DIAGNOSIS — G8929 Other chronic pain: Secondary | ICD-10-CM

## 2017-03-02 ENCOUNTER — Ambulatory Visit (INDEPENDENT_AMBULATORY_CARE_PROVIDER_SITE_OTHER): Payer: Medicare Other | Admitting: Physician Assistant

## 2017-03-02 ENCOUNTER — Encounter (INDEPENDENT_AMBULATORY_CARE_PROVIDER_SITE_OTHER): Payer: Self-pay | Admitting: Physician Assistant

## 2017-03-02 DIAGNOSIS — S83242D Other tear of medial meniscus, current injury, left knee, subsequent encounter: Secondary | ICD-10-CM

## 2017-03-02 DIAGNOSIS — M84362A Stress fracture, left tibia, initial encounter for fracture: Secondary | ICD-10-CM

## 2017-03-02 NOTE — Progress Notes (Signed)
Office Visit Note   Patient: [name] [name]           Date of Birth: [birth year]           MRN: [id number] Visit Date: 03/02/2017              Requested by: [name], MD [address] [place], [place] [address] PCP: [name], MD   Assessment & Plan: Visit Diagnoses:  1. Acute medial meniscus tear of left knee, subsequent encounter   2. Stress fracture of left tibia, initial encounter     Plan: After discussing the findings with [name] [name] would like to proceed with a left knee arthroscopy with partial medial meniscectomy and some chondral arthroplasty.  Risks benefits discussed with patient including DVT/PE, worsening pain, prolonged pain and neurovascular injury.  Follow-Up Instructions:  1 week postop Orders:  No orders of the defined types were placed in this encounter.  No orders of the defined types were placed in this encounter.     Procedures: No procedures performed   Clinical Data: No additional findings.   Subjective: Chief Complaint  Patient presents with  . Left Knee - Pain    HPI [name] [name] returns today for follow-up of his left knee status post MRI.  [name] continues to have pain in the knee with prolonged standing.  [name] feels the injection of the knee helped for a short period of time.  [name] states [name] continues to have swelling in the knee.  Has no mechanical symptoms of the knee. MRI left knee dated 02/25/2017 is reviewed with the patient today using a knee model to demonstrate the findings.  Complex tear at the posterior root of the medial meniscus seen.  Tricompartmental arthritic changes with full-thickness cartilage loss over the medial patellar facet and near full-thickness loss over the trochlear groove.  Mild to moderate diffuse thinning medial compartmental cartilage.  Lateral compartment with mild arthritic changes.  Subchondral insufficiency fracture medial tibial plateau.  Review of Systems See HPI otherwise  negative  Objective: Vital Signs: There were no vitals taken for this visit.  Physical Exam Well-developed well-nourished male in no acute distress mood and affect appropriate Ortho Exam With slight effusion of the left knee.  No abnormal warmth or erythema.  Tenderness along the medial joint line of the knee.  No instability Specialty Comments:  No specialty comments available.  Imaging: No results found.   PMFS History: Patient Active Problem List   Diagnosis Date Noted  . Insomnia 09/14/2015  . Hyperlipidemia with target LDL less than 130 09/11/2014  . Rotator cuff tendinitis 04/28/2014  . Obesity (BMI 30-39.9) 12/29/2013  . 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  . Exertional shortness of breath 06/26/2012  . Polyp of colon, adenomatous 04/27/2012  . Elevated PSA 10/20/2011  . Hypertriglyceridemia 07/08/2011  . Nonischemic cardiomyopathy ([place]) 09/04/2009   Past Medical History:  Diagnosis Date  . Arthritis   . BPH (benign prostatic hypertrophy)   . Coronary atherosclerosis of native coronary vessel    CATH 2004--  MINIMAL NONOBSTRUCTIVE LAD/  NORMAL EF/    CARDIAC CT  03/2008  NO SIGNIFICANT DISEASE  AND  nlef  . Elevated PSA   . Eye problem 03/06/2016   [name]   . History of gout    pt 05-20-2013 states stable   . History of melanoma excision    2011-- LEFT UPPER BACK  . Hyperlipemia   . Hypertriglyceridemia   . Melanoma ([place]) 2011  back  . Nonischemic cardiomyopathy ([place])    09-04-2009  --  2D echo-EF 40-45%, Global Hypokinesis  . Personal history of arthritis   . Personal history of other diseases of circulatory system     Family History  Problem Relation Age of Onset  . Arthritis Mother   . COPD Father   . Cancer Father        lung cancer  . Cancer Sister   . Cardiomyopathy Unknown        idiopathic. trivial disease 2004 cath, 2010 cardiac CT - no sig. dz, nl LV fxn. cards: Little  . Colon cancer Neg Hx     Past Surgical History:   Procedure Laterality Date  . APPENDECTOMY  AS CHILD  . CARDIAC CATHETERIZATION  05/23/2002  &  05-23-1998   DR AL LITTLE   minimal irregularities in the LAD/  EF 50%  . CARDIOVASCULAR STRESS TEST  10-05-1998   MILD GLOBAL HYPOKINESIS AND ISCHEMIAIN ANTEROSEPTAL  AT [facility]/ EF 42%  . EXCISION MELANOMA LEFT UPPER BACK  10-14-2009  . LUMBAR DISC SURGERY  09-12-2000   left  L5 -- S1  . PROSTATE BIOPSY N/A 05/22/2013   Procedure: BIOPSY TRANSRECTAL ULTRASONIC PROSTATE (TUBP);  Surgeon: [name], MD;  Location: [facility];  Service: Urology;  Laterality: N/A;  . ROTATOR CUFF REPAIR Right 2016  . TRANSRECTAL ULTRASOUND PROSTATE BX  05-23-2005  &  04-19-2001  . TRANSTHORACIC ECHOCARDIOGRAM  09-04-2009   MILD LVH/  MILD GLOBAL HYPOKINESIS/ LVEF 40-45%/  MILD AV SCLEROSIS WITHOUT STENOSIS/  MILD PVR   Social History   Occupational History  . 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  Tobacco Use  . Smoking status: Former Smoker    Packs/day: 2.00    Years: 20.00    Pack years: 40.00    Types: Cigarettes    Last attempt to quit: 03/28/1980    Years since quitting: 36.9  . Smokeless tobacco: Never Used  Substance and Sexual Activity  . Alcohol use: No    Alcohol/week: 0.0 oz  . Drug use: No  . Sexual activity: Not on file

## 2017-03-14 ENCOUNTER — Telehealth (INDEPENDENT_AMBULATORY_CARE_PROVIDER_SITE_OTHER): Payer: Self-pay | Admitting: Orthopaedic Surgery

## 2017-03-14 NOTE — Telephone Encounter (Signed)
Patient has called several times today, he is wanting to speak with you about setting up his surgery asap. Thank you. CB # [phone]

## 2017-03-17 NOTE — Telephone Encounter (Signed)
I called patient and scheduled.

## 2017-03-30 ENCOUNTER — Encounter (HOSPITAL_COMMUNITY): Payer: Self-pay | Admitting: *Deleted

## 2017-03-30 NOTE — Patient Instructions (Addendum)
[name] [name]  03/30/2017   Your procedure is scheduled on: 04/07/2017    Report to [facility] Main  Entrance    Report to admitting at  1200 PM   Call this number if you have problems the morning of surgery [phone]    Remember: YOU MUST GO TO SHORT STAY TO GET READY BY YOURSELF  Do not eat food after midnite. May have clear liquids from 12 midnite until 0800am then nothing by mouth.       Take these medicines the morning of surgery with A SIP OF WATER: Gabapentin              CALLL DR Ninfa Linden AND ASK ABOUT STOPPING ASPIRIN AND FISH OIL                                You may not have any metal on your body including hair pins and              piercings  Do not wear jewelry,  lotions, powders or perfumes, deodorant               Men may shave face and neck.   Do not bring valuables to the hospital. [place].  Contacts, dentures or bridgework may not be worn into surgery.      Patients discharged the day of surgery will not be allowed to drive home.  Name and phone number of your driver: [name] [name] CELL [phone]               Please read over the following fact sheets you were given: _____________________________________________________________________             [facility] - Preparing for Surgery Before surgery, you can play an important role.  Because skin is not sterile, your skin needs to be as free of germs as possible.  You can reduce the number of germs on your skin by washing with CHG (chlorahexidine gluconate) soap before surgery.  CHG is an antiseptic cleaner which kills germs and bonds with the skin to continue killing germs even after washing. Please DO NOT use if you have an allergy to CHG or antibacterial soaps.  If your skin becomes reddened/irritated stop using the CHG and inform your nurse when you arrive at Short Stay. Do not shave (including legs and underarms) for at  least 48 hours prior to the first CHG shower.  You may shave your face/neck. Please follow these instructions carefully:  1.  Shower with CHG Soap the night before surgery and the  morning of Surgery.  2.  If you choose to wash your hair, wash your hair first as usual with your  normal  shampoo.  3.  After you shampoo, rinse your hair and body thoroughly to remove the  shampoo.                           4.  Use CHG as you would any other liquid soap.  You can apply chg directly  to the skin and wash  Gently with a scrungie or clean washcloth.  5.  Apply the CHG Soap to your body ONLY FROM THE NECK DOWN.   Do not use on face/ open                           Wound or open sores. Avoid contact with eyes, ears mouth and genitals (private parts).                       Wash face,  Genitals (private parts) with your normal soap.             6.  Wash thoroughly, paying special attention to the area where your surgery  will be performed.  7.  Thoroughly rinse your body with warm water from the neck down.  8.  DO NOT shower/wash with your normal soap after using and rinsing off  the CHG Soap.                9.  Pat yourself dry with a clean towel.            10.  Wear clean pajamas.            11.  Place clean sheets on your bed the night of your first shower and do not  sleep with pets. Day of Surgery : Do not apply any lotions/deodorants the morning of surgery.  Please wear clean clothes to the hospital/surgery center.  FAILURE TO FOLLOW THESE INSTRUCTIONS MAY RESULT IN THE CANCELLATION OF YOUR SURGERY PATIENT SIGNATURE_________________________________  NURSE SIGNATURE__________________________________  ________________________________________________________________________    CLEAR LIQUID DIET   Foods Allowed                                                                     Foods Excluded  Coffee and tea, regular and decaf                             liquids that you  cannot  Plain Jell-O in any flavor                                             see through such as: Fruit ices (not with fruit pulp)                                     milk, soups, orange juice  Iced Popsicles                                    All solid food Carbonated beverages, regular and diet                                    Cranberry, grape and apple juices Sports drinks like Gatorade Lightly seasoned clear broth or consume(fat free) Sugar, honey syrup  Sample Menu Breakfast                                Lunch                                     Supper Cranberry juice                    Beef broth                            Chicken broth Jell-O                                     Grape juice                           Apple juice Coffee or tea                        Jell-O                                      Popsicle                                                Coffee or tea                        Coffee or tea  _____________________________________________________________________

## 2017-03-30 NOTE — Progress Notes (Signed)
Preop on 03/31/16.  Surgery on 04/07/16.  Need orders in epic.

## 2017-03-31 ENCOUNTER — Telehealth (INDEPENDENT_AMBULATORY_CARE_PROVIDER_SITE_OTHER): Payer: Self-pay

## 2017-03-31 ENCOUNTER — Other Ambulatory Visit: Payer: Self-pay

## 2017-03-31 ENCOUNTER — Encounter (HOSPITAL_COMMUNITY): Payer: Self-pay

## 2017-03-31 ENCOUNTER — Encounter (HOSPITAL_COMMUNITY)
Admission: RE | Admit: 2017-03-31 | Discharge: 2017-03-31 | Disposition: A | Discharge status: Home / Self Care | Payer: Medicare Other | Source: Ambulatory Visit | Attending: Orthopaedic Surgery | Admitting: Orthopaedic Surgery

## 2017-03-31 DIAGNOSIS — X58XXXD Exposure to other specified factors, subsequent encounter: Secondary | ICD-10-CM | POA: Insufficient documentation

## 2017-03-31 DIAGNOSIS — S83242D Other tear of medial meniscus, current injury, left knee, subsequent encounter: Secondary | ICD-10-CM | POA: Diagnosis not present

## 2017-03-31 DIAGNOSIS — Z01818 Encounter for other preprocedural examination: Secondary | ICD-10-CM | POA: Diagnosis not present

## 2017-03-31 LAB — BASIC METABOLIC PANEL
Anion gap: 6 (ref 5–15)
BUN: 21 mg/dL — ABNORMAL HIGH (ref 6–20)
CO2: 27 mmol/L (ref 22–32)
Calcium: 9.6 mg/dL (ref 8.9–10.3)
Chloride: 106 mmol/L (ref 101–111)
Creatinine, Ser: 1.05 mg/dL (ref 0.61–1.24)
GFR calc Af Amer: >60 mL/min (ref >60)
GFR calc non Af Amer: >60 mL/min (ref >60)
Glucose, Bld: 111 mg/dL — ABNORMAL HIGH (ref 65–99)
Potassium: 4.4 mmol/L (ref 3.5–5.1)
Sodium: 139 mmol/L (ref 135–145)

## 2017-03-31 LAB — CBC
HCT: 41 % (ref 39.0–52.0)
Hemoglobin: 13.6 g/dL (ref 13.0–17.0)
MCH: 29.6 pg (ref 26.0–34.0)
MCHC: 33.2 g/dL (ref 30.0–36.0)
MCV: 89.3 fL (ref 78.0–100.0)
Platelets: 181 10*3/uL (ref 150–400)
RBC: 4.59 MIL/uL (ref 4.22–5.81)
RDW: 13.4 % (ref 11.5–15.5)
WBC: 8.1 10*3/uL (ref 4.0–10.5)

## 2017-03-31 NOTE — Progress Notes (Signed)
ekg and lov meng po cardiology 09-27-16 epic

## 2017-03-31 NOTE — Telephone Encounter (Signed)
Per call from patient, he said "Dr." Carlis Abbott said he was going to have to stay overnight after surgery.  Surgery order does not indicate that.  Also, patient needs instructions about aspirin and fish oil.

## 2017-04-03 ENCOUNTER — Other Ambulatory Visit (INDEPENDENT_AMBULATORY_CARE_PROVIDER_SITE_OTHER): Payer: Self-pay | Admitting: Physician Assistant

## 2017-04-03 NOTE — Telephone Encounter (Signed)
He can continue his aspirin and fish oil.  The surgery is just a knee scope which will take less than an hour.  This will be done as an outpatient and he does not need to stay overnight.

## 2017-04-05 NOTE — Telephone Encounter (Signed)
I called and advised patient.

## 2017-04-06 ENCOUNTER — Other Ambulatory Visit: Payer: Self-pay

## 2017-04-06 ENCOUNTER — Ambulatory Visit (INDEPENDENT_AMBULATORY_CARE_PROVIDER_SITE_OTHER): Payer: Medicare Other | Admitting: Family Medicine

## 2017-04-06 ENCOUNTER — Encounter: Payer: Self-pay | Admitting: Family Medicine

## 2017-04-06 VITALS — BP 108/68 | HR 81 | Temp 98.0°F | Resp 16 | Ht 68.5 in | Wt 202.0 lb

## 2017-04-06 DIAGNOSIS — G6289 Other specified polyneuropathies: Secondary | ICD-10-CM

## 2017-04-06 DIAGNOSIS — E785 Hyperlipidemia, unspecified: Secondary | ICD-10-CM | POA: Diagnosis not present

## 2017-04-06 MED ORDER — GABAPENTIN 300 MG PO CAPS
300.0000 mg | ORAL_CAPSULE | Freq: Two times a day (BID) | ORAL | 1 refills | Status: DC | PRN
Start: 2017-04-06 — End: 2018-05-15

## 2017-04-06 NOTE — Progress Notes (Signed)
Subjective:  By signing my name below, I, Moises Blood, attest that this documentation has been prepared under the direction and in the presence of [name], MD. Electronically Signed: Moises Blood, [place]. 04/06/2017 , 9:57 AM .  Patient was seen in Room 11 .   Patient ID: [name] [name], male    DOB: 1946-02-02, 72 y.o.   MRN: [id number] Chief Complaint  Patient presents with  . Hyperlipidemia    6 month follow-up   . Medication Refill    Gabapentin ( needs to go to mail order)    HPI [name] [name] is a 72 y.o. male Here for follow up of hyperlipidemia. He is fasting today.   Left knee surgery He is having left knee surgery with orthopedics done tomorrow.   Hyperlipidemia Lab Results  Component Value Date   CHOL 212 (H) 10/04/2016   HDL 37 (L) 10/04/2016   LDLCALC 127 (H) 10/04/2016   TRIG 241 (H) 10/04/2016   CHOLHDL 5.7 (H) 10/04/2016   Lab Results  Component Value Date   ALT 28 10/04/2016   AST 24 10/04/2016   ALKPHOS 64 10/04/2016   BILITOT 0.3 10/04/2016   He takes fish oil. He was intolerant to lovastatin. He's followed by cardiologist for cardiomyopathy. Did discuss statin in the past but also recommended discussing with his cardiologist.   Elevated ASCVD risk as below.  The 10-year ASCVD risk score Mikey Bussing DC Brooke Bonito., et al., 2013) is: 18.1%   Values used to calculate the score:     Age: 61 years     Sex: Male     Is Non-Hispanic African American: No     Diabetic: No     Tobacco smoker: No     Systolic Blood Pressure: 025 mmHg     Is BP treated: No     HDL Cholesterol: 37 mg/dL     Total Cholesterol: 212 mg/dL  Polyneuropathy/peripheral neuropathy He is on gabapentin 300mg  1-2 up to 3 times per day. This has helped symptoms previously.   He states he doesn't take his gabapentin every day, just depending on how active he is. When he's more active, he takes up to 2 pill twice a day. He has taken up to 3 times a day in the past.   Patient Active  Problem List   Diagnosis Date Noted  . Insomnia 09/14/2015  . Hyperlipidemia with target LDL less than 130 09/11/2014  . Rotator cuff tendinitis 04/28/2014  . Obesity (BMI 30-39.9) 12/29/2013  . 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  . Exertional shortness of breath 06/26/2012  . Polyp of colon, adenomatous 04/27/2012  . Elevated PSA 10/20/2011  . Hypertriglyceridemia 07/08/2011  . Nonischemic cardiomyopathy ([place]) 09/04/2009   Past Medical History:  Diagnosis Date  . Arthritis   . BPH (benign prostatic hypertrophy)   . Coronary atherosclerosis of native coronary vessel    CATH 2004--  MINIMAL NONOBSTRUCTIVE LAD/  NORMAL EF/    CARDIAC CT  03/2008  NO SIGNIFICANT DISEASE  AND  nlef  . Elevated PSA   . Eye problem 03/06/2016   [name] retinal detachment left eye, no sx done  . History of gout    pt 05-20-2013 states stable   . History of melanoma excision    2011-- LEFT UPPER BACK  . Hyperlipemia   . Hypertriglyceridemia   . Melanoma ([place]) 2011   back  . Nonischemic cardiomyopathy ([place])    09-04-2009  --  2D echo-EF 40-45%, Global Hypokinesis  .  Personal history of arthritis   . Personal history of other diseases of circulatory system    Past Surgical History:  Procedure Laterality Date  . APPENDECTOMY  AS CHILD  . CARDIAC CATHETERIZATION  05/23/2002  &  05-23-1998   DR AL LITTLE   minimal irregularities in the LAD/  EF 50%  . CARDIOVASCULAR STRESS TEST  10-05-1998   MILD GLOBAL HYPOKINESIS AND ISCHEMIAIN ANTEROSEPTAL  AT [facility]/ EF 42%  . EXCISION MELANOMA LEFT UPPER BACK  10-14-2009  . eye lid surgery Bilateral 2018  . knee injections Bilateral   . LUMBAR DISC SURGERY  09-12-2000   left  L5 -- S1  . PROSTATE BIOPSY N/A 05/22/2013   Procedure: BIOPSY TRANSRECTAL ULTRASONIC PROSTATE (TUBP);  Surgeon: [name], MD;  [facility];  Service: Urology;  Laterality: N/A;  . ROTATOR CUFF REPAIR Right 2016  . TRANSRECTAL ULTRASOUND PROSTATE  BX  05-23-2005  &  04-19-2001  . TRANSTHORACIC ECHOCARDIOGRAM  09-04-2009   MILD LVH/  MILD GLOBAL HYPOKINESIS/ LVEF 40-45%/  MILD AV SCLEROSIS WITHOUT STENOSIS/  MILD PVR   Allergies  Allergen Reactions  . Nitroglycerin Anaphylaxis    "Cardiologist suggested he shouldn't take this med because it could kill him with his heart condition. Lowers BP"  . Ace Inhibitors Swelling  . Lovastatin Nausea Only  . Solarcaine [Benzocaine] Dermatitis   Prior to Admission medications   Medication Sig Start Date End Date Taking? Authorizing Provider  aspirin 325 MG tablet Take 325 mg by mouth daily.    [provider]  doxycycline (VIBRAMYCIN) 100 MG capsule Take 1 capsule (100 mg total) by mouth 2 (two) times daily. Patient not taking: Reported on 03/31/2017 12/16/16   Posey Boyer, MD  fish oil-omega-3 fatty acids 1000 MG capsule Take 1 g by mouth daily.    [provider]  gabapentin (NEURONTIN) 300 MG capsule Take 1-2 capsules (300-600 mg total) by mouth 2 (two) times daily as needed. 10/27/16   [name], MD  Garlic TABS Take 1 tablet by mouth daily.    [provider]  HYDROcodone-acetaminophen (NORCO/VICODIN) 5-325 MG tablet Take 1 tablet by mouth every 6 (six) hours as needed for severe pain. Patient not taking: Reported on 03/31/2017 01/26/17   Pete Pelt, PA-C  Multiple Vitamins-Minerals (MULTIVITAMIN PO) Take 1 tablet by mouth daily.    [provider]  naproxen sodium (ANAPROX) 220 MG tablet Take 440 mg by mouth 2 (two) times daily as needed (pain).    [provider]  polyvinyl alcohol (LIQUIFILM TEARS) 1.4 % ophthalmic solution Place 1 drop into both eyes daily as needed for dry eyes.    [provider]  sildenafil (VIAGRA) 100 MG tablet 1/2 to 1 tablet up to each day as needed. Patient taking differently: Take 50-100 mg by mouth daily as needed for erectile dysfunction.  07/08/11   [name], MD  Turmeric Curcumin 500 MG  CAPS Take 1 tablet by mouth daily.    [provider]  UNABLE TO FIND Equate cold medicine take 2 caps in am and 2 in pm prn    [provider]   Social History   Socioeconomic History  . Marital status: Widowed    Spouse name: Not on file  . Number of children: Not on file  . Years of education: Not on file  . Highest education level: Not on file  Social Needs  . Financial resource strain: Not on file  .  Food insecurity - worry: Not on file  . Food insecurity - inability: Not on file  . Transportation needs - medical: Not on file  . Transportation needs - non-medical: Not on file  Occupational History  . 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  Tobacco Use  . Smoking status: Former Smoker    Packs/day: 2.00    Years: 20.00    Pack years: 40.00    Types: Cigarettes    Last attempt to quit: 03/28/1980    Years since quitting: 37.0  . Smokeless tobacco: Never Used  Substance and Sexual Activity  . Alcohol use: No    Alcohol/week: 0.0 oz  . Drug use: No  . Sexual activity: Not on file  Other Topics Concern  . Not on file  Social History Narrative   He walks on a relatively regular basis about 15 minutes at a time mostly to the discomfort. He previously had walked up a 30-40 minutes a time without problems. Education: [employer].   Review of Systems  Constitutional: Negative for fatigue and unexpected weight change.  Eyes: Negative for visual disturbance.  Respiratory: Negative for cough, chest tightness and shortness of breath.   Cardiovascular: Negative for chest pain, palpitations and leg swelling.  Gastrointestinal: Negative for abdominal pain and blood in stool.  Neurological: Negative for dizziness, light-headedness and headaches.       Objective:   Physical Exam  Constitutional: He is oriented to person, place, and time. He appears well-developed and well-nourished.  HENT:    Head: Normocephalic and atraumatic.  Eyes: EOM are normal. Pupils are equal, round, and reactive to light.  Neck: No JVD present. Carotid bruit is not present.  Cardiovascular: Normal rate, regular rhythm and normal heart sounds.  No murmur heard. Pulmonary/Chest: Effort normal and breath sounds normal. He has no rales.  Musculoskeletal: He exhibits no edema.  Neurological: He is alert and oriented to person, place, and time.  Skin: Skin is warm and dry.  Psychiatric: He has a normal mood and affect.  Vitals reviewed.   Vitals:   04/06/17 0924  BP: 108/68  Pulse: 81  Resp: 16  Temp: 98 F (36.7 C)  TempSrc: Oral  SpO2: 98%  Weight: 202 lb (91.6 kg)  Height: 5' 8.5" (1.74 m)      Assessment & Plan:   [name] [name] is a 72 y.o. male Hyperlipidemia, unspecified hyperlipidemia type - Plan: Comprehensive metabolic panel, Lipid panel  - Previously have recommended statin, as well as apparently recommended by his cardiologist. Declined statin again today, would like to see labs again to decide. Also would like to discuss with his cardiologist. Continue to watch diet, fish oil over-the-counter.   Other polyneuropathy - Plan: gabapentin (NEURONTIN) 300 MG capsule  -Stable, tolerating current dose of gabapentin with variable need. Refilled at same dosing.  Meds ordered this encounter  Medications  . gabapentin (NEURONTIN) 300 MG capsule    Sig: Take 1-2 capsules (300-600 mg total) by mouth 2 (two) times daily as needed.    Dispense:  360 capsule    Refill:  1   Patient Instructions    I will check your cholesterol levels again, but based on prior testing and estimated heart disease risk, would recommend statin medication. Can discuss those labs with your cardiologist at your next visit as well if you would prefer.   No change in gapapentin for now.  Return to the clinic or go  to the nearest emergency room if any of your symptoms worsen or new symptoms occur.  Thanks for  coming in today.      IF you received an x-ray today, you will receive an invoice from [facility]. Please contact [facility] at [phone] with questions or concerns regarding your invoice.   IF you received labwork today, you will receive an invoice from [facility]. Please contact [facility] at [phone] with questions or concerns regarding your invoice.   Our billing staff will not be able to assist you with questions regarding bills from these companies.  You will be contacted with the lab results as soon as they are available. The fastest way to get your results is to activate your My Chart account. Instructions are located on the last page of this paperwork. If you have not heard from [place] regarding the results in 2 weeks, please contact this office.       I personally performed the services described in this documentation, which was scribed in my presence. The recorded information has been reviewed and considered for accuracy and completeness, addended by me as needed, and agree with information above.  Signed,   [name], MD [facility].  04/06/17 10:02 AM

## 2017-04-06 NOTE — Patient Instructions (Addendum)
  I will check your cholesterol levels again, but based on prior testing and estimated heart disease risk, would recommend statin medication. Can discuss those labs with your cardiologist at your next visit as well if you would prefer.   No change in gapapentin for now.  Return to the clinic or go to the nearest emergency room if any of your symptoms worsen or new symptoms occur.  Thanks for coming in today.      IF you received an x-ray today, you will receive an invoice from [facility]. Please contact [facility] at [phone] with questions or concerns regarding your invoice.   IF you received labwork today, you will receive an invoice from [facility]. Please contact [facility] at [phone] with questions or concerns regarding your invoice.   Our billing staff will not be able to assist you with questions regarding bills from these companies.  You will be contacted with the lab results as soon as they are available. The fastest way to get your results is to activate your My Chart account. Instructions are located on the last page of this paperwork. If you have not heard from [place] regarding the results in 2 weeks, please contact this office.

## 2017-04-07 ENCOUNTER — Encounter (HOSPITAL_COMMUNITY): Payer: Self-pay | Admitting: *Deleted

## 2017-04-07 ENCOUNTER — Ambulatory Visit (HOSPITAL_COMMUNITY): Payer: Medicare Other | Admitting: Certified Registered"

## 2017-04-07 ENCOUNTER — Ambulatory Visit (HOSPITAL_COMMUNITY): Payer: Medicare Other

## 2017-04-07 ENCOUNTER — Other Ambulatory Visit: Payer: Self-pay

## 2017-04-07 ENCOUNTER — Observation Stay (HOSPITAL_COMMUNITY)
Admission: RE | Admit: 2017-04-07 | Discharge: 2017-04-08 | Disposition: A | Discharge status: Home / Self Care | Payer: Medicare Other | Source: Ambulatory Visit | Attending: Orthopaedic Surgery | Admitting: Orthopaedic Surgery

## 2017-04-07 ENCOUNTER — Encounter (HOSPITAL_COMMUNITY)
Admission: RE | Disposition: A | Discharge status: Home / Self Care | Payer: Self-pay | Source: Ambulatory Visit | Attending: Orthopaedic Surgery

## 2017-04-07 DIAGNOSIS — Z8679 Personal history of other diseases of the circulatory system: Secondary | ICD-10-CM | POA: Diagnosis not present

## 2017-04-07 DIAGNOSIS — G8929 Other chronic pain: Secondary | ICD-10-CM | POA: Insufficient documentation

## 2017-04-07 DIAGNOSIS — E785 Hyperlipidemia, unspecified: Secondary | ICD-10-CM | POA: Diagnosis not present

## 2017-04-07 DIAGNOSIS — S83242A Other tear of medial meniscus, current injury, left knee, initial encounter: Secondary | ICD-10-CM | POA: Diagnosis not present

## 2017-04-07 DIAGNOSIS — Z85820 Personal history of malignant melanoma of skin: Secondary | ICD-10-CM | POA: Insufficient documentation

## 2017-04-07 DIAGNOSIS — M1990 Unspecified osteoarthritis, unspecified site: Secondary | ICD-10-CM | POA: Insufficient documentation

## 2017-04-07 DIAGNOSIS — Z79899 Other long term (current) drug therapy: Secondary | ICD-10-CM | POA: Insufficient documentation

## 2017-04-07 DIAGNOSIS — N400 Enlarged prostate without lower urinary tract symptoms: Secondary | ICD-10-CM | POA: Diagnosis not present

## 2017-04-07 DIAGNOSIS — M23322 Other meniscus derangements, posterior horn of medial meniscus, left knee: Secondary | ICD-10-CM

## 2017-04-07 DIAGNOSIS — I2510 Atherosclerotic heart disease of native coronary artery without angina pectoris: Secondary | ICD-10-CM | POA: Insufficient documentation

## 2017-04-07 DIAGNOSIS — Z9889 Other specified postprocedural states: Secondary | ICD-10-CM

## 2017-04-07 DIAGNOSIS — I429 Cardiomyopathy, unspecified: Secondary | ICD-10-CM | POA: Diagnosis not present

## 2017-04-07 DIAGNOSIS — M109 Gout, unspecified: Secondary | ICD-10-CM | POA: Diagnosis not present

## 2017-04-07 DIAGNOSIS — E7800 Pure hypercholesterolemia, unspecified: Secondary | ICD-10-CM | POA: Insufficient documentation

## 2017-04-07 DIAGNOSIS — S82125D Nondisplaced fracture of lateral condyle of left tibia, subsequent encounter for closed fracture with routine healing: Secondary | ICD-10-CM

## 2017-04-07 DIAGNOSIS — Z7982 Long term (current) use of aspirin: Secondary | ICD-10-CM | POA: Diagnosis not present

## 2017-04-07 DIAGNOSIS — E781 Pure hyperglyceridemia: Secondary | ICD-10-CM | POA: Insufficient documentation

## 2017-04-07 DIAGNOSIS — Z419 Encounter for procedure for purposes other than remedying health state, unspecified: Secondary | ICD-10-CM

## 2017-04-07 DIAGNOSIS — Z96652 Presence of left artificial knee joint: Secondary | ICD-10-CM | POA: Diagnosis not present

## 2017-04-07 DIAGNOSIS — S82142A Displaced bicondylar fracture of left tibia, initial encounter for closed fracture: Secondary | ICD-10-CM | POA: Diagnosis not present

## 2017-04-07 DIAGNOSIS — M2242 Chondromalacia patellae, left knee: Secondary | ICD-10-CM | POA: Diagnosis not present

## 2017-04-07 HISTORY — PX: KNEE ARTHROSCOPY WITH SUBCHONDROPLASTY: SHX6732

## 2017-04-07 LAB — LIPID PANEL
Chol/HDL Ratio: 5.8 ratio — ABNORMAL HIGH (ref 0.0–5.0)
Cholesterol, Total: 237 mg/dL — ABNORMAL HIGH (ref 100–199)
HDL: 41 mg/dL (ref >39)
LDL Calculated: 160 mg/dL — ABNORMAL HIGH (ref 0–99)
Triglycerides: 180 mg/dL — ABNORMAL HIGH (ref 0–149)
VLDL Cholesterol Cal: 36 mg/dL (ref 5–40)

## 2017-04-07 LAB — COMPREHENSIVE METABOLIC PANEL
ALT: 24 IU/L (ref 0–44)
AST: 17 IU/L (ref 0–40)
Albumin/Globulin Ratio: 1.6 (ref 1.2–2.2)
Albumin: 4.9 g/dL — ABNORMAL HIGH (ref 3.5–4.8)
Alkaline Phosphatase: 76 IU/L (ref 39–117)
BUN/Creatinine Ratio: 16 (ref 10–24)
BUN: 19 mg/dL (ref 8–27)
Bilirubin Total: <0.2 mg/dL (ref 0.0–1.2)
CO2: 22 mmol/L (ref 20–29)
Calcium: 9.8 mg/dL (ref 8.6–10.2)
Chloride: 101 mmol/L (ref 96–106)
Creatinine, Ser: 1.16 mg/dL (ref 0.76–1.27)
GFR calc Af Amer: 72 mL/min/{1.73_m2} (ref >59)
GFR calc non Af Amer: 63 mL/min/{1.73_m2} (ref >59)
Globulin, Total: 3 g/dL (ref 1.5–4.5)
Glucose: 120 mg/dL — ABNORMAL HIGH (ref 65–99)
Potassium: 5.1 mmol/L (ref 3.5–5.2)
Sodium: 141 mmol/L (ref 134–144)
Total Protein: 7.9 g/dL (ref 6.0–8.5)

## 2017-04-07 SURGERY — ARTHROSCOPY, KNEE, WITH SUBCHONDROPLASTY
Anesthesia: General | Laterality: Left

## 2017-04-07 MED ORDER — ACETAMINOPHEN 650 MG RE SUPP: 650.0000 mg | RECTAL | Status: DC | PRN

## 2017-04-07 MED ORDER — PHENYLEPHRINE 40 MCG/ML (10ML) SYRINGE FOR IV PUSH (FOR BLOOD PRESSURE SUPPORT)
PREFILLED_SYRINGE | INTRAVENOUS | Status: AC
  Filled 2017-04-07: qty 10

## 2017-04-07 MED ORDER — GABAPENTIN 300 MG PO CAPS
300.0000 mg | ORAL_CAPSULE | Freq: Three times a day (TID) | ORAL | Status: DC
  Administered 2017-04-07 – 2017-04-08 (×2): 300 mg via ORAL
  Filled 2017-04-07 (×2): qty 1

## 2017-04-07 MED ORDER — HYDROMORPHONE HCL 1 MG/ML IJ SOLN
0.2500 mg | INTRAMUSCULAR | Status: DC | PRN
  Administered 2017-04-07 (×4): 0.5 mg via INTRAVENOUS

## 2017-04-07 MED ORDER — MIDAZOLAM HCL 2 MG/2ML IJ SOLN
INTRAMUSCULAR | Status: AC
  Filled 2017-04-07: qty 2

## 2017-04-07 MED ORDER — MIDAZOLAM HCL 2 MG/2ML IJ SOLN
0.5000 mg | Freq: Once | INTRAMUSCULAR | Status: AC
  Administered 2017-04-07: 0.5 mg via INTRAVENOUS

## 2017-04-07 MED ORDER — FENTANYL CITRATE (PF) 100 MCG/2ML IJ SOLN
INTRAMUSCULAR | Status: AC
  Filled 2017-04-07: qty 2

## 2017-04-07 MED ORDER — ONDANSETRON HCL 4 MG PO TABS: 4.0000 mg | ORAL_TABLET | Freq: Four times a day (QID) | ORAL | Status: DC | PRN

## 2017-04-07 MED ORDER — ONDANSETRON HCL 4 MG/2ML IJ SOLN: 4.0000 mg | Freq: Once | INTRAMUSCULAR | Status: DC | PRN

## 2017-04-07 MED ORDER — ACETAMINOPHEN 500 MG PO TABS
ORAL_TABLET | ORAL | Status: AC
  Administered 2017-04-07: 1000 mg via ORAL
  Filled 2017-04-07: qty 2

## 2017-04-07 MED ORDER — DIPHENHYDRAMINE HCL 12.5 MG/5ML PO ELIX: 12.5000 mg | ORAL_SOLUTION | ORAL | Status: DC | PRN

## 2017-04-07 MED ORDER — LIDOCAINE 2% (20 MG/ML) 5 ML SYRINGE
INTRAMUSCULAR | Status: AC
  Filled 2017-04-07: qty 5

## 2017-04-07 MED ORDER — HYDROMORPHONE HCL 1 MG/ML IJ SOLN
INTRAMUSCULAR | Status: AC
  Filled 2017-04-07: qty 1

## 2017-04-07 MED ORDER — POLYVINYL ALCOHOL 1.4 % OP SOLN
1.0000 [drp] | Freq: Every day | OPHTHALMIC | Status: DC | PRN
  Filled 2017-04-07: qty 15

## 2017-04-07 MED ORDER — PROPOFOL 10 MG/ML IV BOLUS
INTRAVENOUS | Status: DC | PRN
  Administered 2017-04-07: 50 mg via INTRAVENOUS
  Administered 2017-04-07: 150 mg via INTRAVENOUS

## 2017-04-07 MED ORDER — MORPHINE SULFATE (PF) 4 MG/ML IV SOLN
INTRAVENOUS | Status: AC
Start: 2017-04-07 — End: ?
  Filled 2017-04-07: qty 1

## 2017-04-07 MED ORDER — ONDANSETRON HCL 4 MG/2ML IJ SOLN
INTRAMUSCULAR | Status: DC | PRN
  Administered 2017-04-07: 4 mg via INTRAVENOUS

## 2017-04-07 MED ORDER — ASPIRIN 325 MG PO TABS
325.0000 mg | ORAL_TABLET | Freq: Every day | ORAL | Status: DC
  Administered 2017-04-07 – 2017-04-08 (×2): 325 mg via ORAL
  Filled 2017-04-07 (×2): qty 1

## 2017-04-07 MED ORDER — OXYCODONE HCL 5 MG PO TABS
10.0000 mg | ORAL_TABLET | ORAL | Status: DC | PRN
  Administered 2017-04-07: 10 mg via ORAL
  Administered 2017-04-08 (×2): 15 mg via ORAL
  Filled 2017-04-07: qty 3
  Filled 2017-04-07: qty 2
  Filled 2017-04-07: qty 3

## 2017-04-07 MED ORDER — METHOCARBAMOL 1000 MG/10ML IJ SOLN
500.0000 mg | Freq: Four times a day (QID) | INTRAVENOUS | Status: DC | PRN
  Filled 2017-04-07: qty 5

## 2017-04-07 MED ORDER — OXYCODONE-ACETAMINOPHEN 5-325 MG PO TABS: 1.0000 | ORAL_TABLET | ORAL | 0 refills | Status: DC | PRN

## 2017-04-07 MED ORDER — ONDANSETRON HCL 4 MG/2ML IJ SOLN
INTRAMUSCULAR | Status: AC
  Filled 2017-04-07: qty 2

## 2017-04-07 MED ORDER — DEXAMETHASONE SODIUM PHOSPHATE 10 MG/ML IJ SOLN
INTRAMUSCULAR | Status: DC | PRN
  Administered 2017-04-07: 10 mg via INTRAVENOUS

## 2017-04-07 MED ORDER — METHOCARBAMOL 500 MG PO TABS
500.0000 mg | ORAL_TABLET | Freq: Four times a day (QID) | ORAL | Status: DC | PRN
  Administered 2017-04-07 – 2017-04-08 (×2): 500 mg via ORAL
  Filled 2017-04-07 (×2): qty 1

## 2017-04-07 MED ORDER — METOCLOPRAMIDE HCL 5 MG/ML IJ SOLN: 5.0000 mg | Freq: Three times a day (TID) | INTRAMUSCULAR | Status: DC | PRN

## 2017-04-07 MED ORDER — LACTATED RINGERS IR SOLN
Status: DC | PRN
  Administered 2017-04-07: 6000 mL

## 2017-04-07 MED ORDER — METOCLOPRAMIDE HCL 5 MG PO TABS: 5.0000 mg | ORAL_TABLET | Freq: Three times a day (TID) | ORAL | Status: DC | PRN

## 2017-04-07 MED ORDER — HYDROMORPHONE HCL 1 MG/ML IJ SOLN
INTRAMUSCULAR | Status: AC
  Filled 2017-04-07: qty 2

## 2017-04-07 MED ORDER — BUPIVACAINE HCL (PF) 0.5 % IJ SOLN
INTRAMUSCULAR | Status: AC
  Filled 2017-04-07: qty 30

## 2017-04-07 MED ORDER — LIDOCAINE 2% (20 MG/ML) 5 ML SYRINGE
INTRAMUSCULAR | Status: DC | PRN
  Administered 2017-04-07: 100 mg via INTRAVENOUS

## 2017-04-07 MED ORDER — KETOROLAC TROMETHAMINE 30 MG/ML IJ SOLN
30.0000 mg | Freq: Once | INTRAMUSCULAR | Status: AC
  Administered 2017-04-07: 30 mg via INTRAVENOUS

## 2017-04-07 MED ORDER — KETOROLAC TROMETHAMINE 30 MG/ML IJ SOLN
INTRAMUSCULAR | Status: DC | PRN
  Administered 2017-04-07: 30 mg via INTRAVENOUS

## 2017-04-07 MED ORDER — KETOROLAC TROMETHAMINE 30 MG/ML IJ SOLN
INTRAMUSCULAR | Status: AC
  Filled 2017-04-07: qty 1

## 2017-04-07 MED ORDER — PROPOFOL 10 MG/ML IV BOLUS
INTRAVENOUS | Status: AC
  Filled 2017-04-07: qty 20

## 2017-04-07 MED ORDER — SUCCINYLCHOLINE CHLORIDE 200 MG/10ML IV SOSY
PREFILLED_SYRINGE | INTRAVENOUS | Status: AC
  Filled 2017-04-07: qty 10

## 2017-04-07 MED ORDER — LACTATED RINGERS IV SOLN
INTRAVENOUS | Status: DC
  Administered 2017-04-07: 13:00:00 via INTRAVENOUS

## 2017-04-07 MED ORDER — SODIUM CHLORIDE 0.9 % IV SOLN
INTRAVENOUS | Status: DC
  Administered 2017-04-07: 19:00:00 via INTRAVENOUS

## 2017-04-07 MED ORDER — ACETAMINOPHEN 500 MG PO TABS
1000.0000 mg | ORAL_TABLET | Freq: Once | ORAL | Status: AC
  Administered 2017-04-07: 1000 mg via ORAL

## 2017-04-07 MED ORDER — CEFAZOLIN SODIUM-DEXTROSE 2-4 GM/100ML-% IV SOLN
2.0000 g | INTRAVENOUS | Status: AC
  Administered 2017-04-07: 2 g via INTRAVENOUS
  Filled 2017-04-07: qty 100

## 2017-04-07 MED ORDER — DOCUSATE SODIUM 100 MG PO CAPS
100.0000 mg | ORAL_CAPSULE | Freq: Two times a day (BID) | ORAL | Status: DC
  Administered 2017-04-07 – 2017-04-08 (×2): 100 mg via ORAL
  Filled 2017-04-07 (×2): qty 1

## 2017-04-07 MED ORDER — FENTANYL CITRATE (PF) 100 MCG/2ML IJ SOLN
25.0000 ug | INTRAMUSCULAR | Status: DC | PRN
  Administered 2017-04-07 (×2): 50 ug via INTRAVENOUS

## 2017-04-07 MED ORDER — DEXAMETHASONE SODIUM PHOSPHATE 10 MG/ML IJ SOLN
INTRAMUSCULAR | Status: AC
  Filled 2017-04-07: qty 1

## 2017-04-07 MED ORDER — HYDROCODONE-ACETAMINOPHEN 5-325 MG PO TABS: 1.0000 | ORAL_TABLET | ORAL | Status: DC | PRN

## 2017-04-07 MED ORDER — MORPHINE SULFATE (PF) 4 MG/ML IV SOLN
INTRAVENOUS | Status: DC | PRN
  Administered 2017-04-07: 4 mg

## 2017-04-07 MED ORDER — HYDROMORPHONE HCL 1 MG/ML IJ SOLN
0.2500 mg | INTRAMUSCULAR | Status: DC | PRN
  Administered 2017-04-07 (×2): 0.5 mg via INTRAVENOUS

## 2017-04-07 MED ORDER — MIDAZOLAM HCL 2 MG/2ML IJ SOLN: 0.5000 mg | Freq: Once | INTRAMUSCULAR | Status: DC

## 2017-04-07 MED ORDER — BUPIVACAINE HCL (PF) 0.5 % IJ SOLN
INTRAMUSCULAR | Status: DC | PRN
  Administered 2017-04-07: 30 mL

## 2017-04-07 MED ORDER — HYDROMORPHONE HCL 1 MG/ML IJ SOLN: 1.0000 mg | INTRAMUSCULAR | Status: DC | PRN

## 2017-04-07 MED ORDER — METHOCARBAMOL 1000 MG/10ML IJ SOLN
500.0000 mg | Freq: Once | INTRAVENOUS | Status: AC
  Administered 2017-04-07: 500 mg via INTRAVENOUS
  Filled 2017-04-07: qty 550

## 2017-04-07 MED ORDER — PHENYLEPHRINE 40 MCG/ML (10ML) SYRINGE FOR IV PUSH (FOR BLOOD PRESSURE SUPPORT)
PREFILLED_SYRINGE | INTRAVENOUS | Status: DC | PRN
  Administered 2017-04-07 (×3): 80 ug via INTRAVENOUS

## 2017-04-07 MED ORDER — ONDANSETRON HCL 4 MG/2ML IJ SOLN: 4.0000 mg | Freq: Four times a day (QID) | INTRAMUSCULAR | Status: DC | PRN

## 2017-04-07 MED ORDER — ACETAMINOPHEN 325 MG PO TABS: 650.0000 mg | ORAL_TABLET | ORAL | Status: DC | PRN

## 2017-04-07 MED ORDER — STERILE WATER FOR IRRIGATION IR SOLN
Status: DC | PRN
  Administered 2017-04-07: 1000 mL

## 2017-04-07 MED ORDER — FENTANYL CITRATE (PF) 100 MCG/2ML IJ SOLN
INTRAMUSCULAR | Status: DC | PRN
  Administered 2017-04-07 (×2): 25 ug via INTRAVENOUS
  Administered 2017-04-07: 50 ug via INTRAVENOUS

## 2017-04-07 MED ORDER — CHLORHEXIDINE GLUCONATE 4 % EX LIQD: 60.0000 mL | Freq: Once | CUTANEOUS | Status: DC

## 2017-04-07 SURGICAL SUPPLY — 29 items
BANDAGE ACE 6X5 VEL STRL LF (GAUZE/BANDAGES/DRESSINGS) ×3 IMPLANT
BLADE CUDA SHAVER 3.5 (BLADE) ×3 IMPLANT
COVER SURGICAL LIGHT HANDLE (MISCELLANEOUS) ×3 IMPLANT
CUFF TOURN SGL QUICK 34 (TOURNIQUET CUFF)
CUFF TRNQT CYL 34X4X40X1 (TOURNIQUET CUFF) IMPLANT
DRAPE U-SHAPE 47X51 STRL (DRAPES) ×3 IMPLANT
DRSG PAD ABDOMINAL 8X10 ST (GAUZE/BANDAGES/DRESSINGS) ×3 IMPLANT
DURAPREP 26ML APPLICATOR (WOUND CARE) ×3 IMPLANT
GAUZE XEROFORM 1X8 LF (GAUZE/BANDAGES/DRESSINGS) ×3 IMPLANT
GLOVE BIO SURGEON STRL SZ7.5 (GLOVE) ×3 IMPLANT
GLOVE BIOGEL PI IND STRL 8 (GLOVE) ×2 IMPLANT
GLOVE BIOGEL PI INDICATOR 8 (GLOVE) ×4
GLOVE ECLIPSE 8.0 STRL XLNG CF (GLOVE) ×3 IMPLANT
GOWN STRL REUS W/TWL XL LVL3 (GOWN DISPOSABLE) ×6 IMPLANT
IV LACTATED RINGER IRRG 3000ML (IV SOLUTION) ×4
IV LR IRRIG 3000ML ARTHROMATIC (IV SOLUTION) ×2 IMPLANT
KIT BASIN OR (CUSTOM PROCEDURE TRAY) ×3 IMPLANT
KIT MIXER ACCUMIX (KITS) ×3 IMPLANT
KNEE KIT SCP W/SIDE ACCUPORT (Joint) ×3 IMPLANT
MANIFOLD NEPTUNE II (INSTRUMENTS) ×3 IMPLANT
PACK ARTHROSCOPY WL (CUSTOM PROCEDURE TRAY) ×3 IMPLANT
PADDING CAST COTTON 6X4 STRL (CAST SUPPLIES) ×3 IMPLANT
POSITIONER SURGICAL ARM (MISCELLANEOUS) ×3 IMPLANT
SUT ETHILON 4 0 PS 2 18 (SUTURE) ×3 IMPLANT
SYR CONTROL 10ML LL (SYRINGE) ×3 IMPLANT
TOWEL OR 17X26 10 PK STRL BLUE (TOWEL DISPOSABLE) ×3 IMPLANT
TUBING ARTHRO INFLOW-ONLY STRL (TUBING) ×3 IMPLANT
WAND HAND CNTRL MULTIVAC 90 (MISCELLANEOUS) IMPLANT
WRAP KNEE MAXI GEL POST OP (GAUZE/BANDAGES/DRESSINGS) ×3 IMPLANT

## 2017-04-07 NOTE — Plan of Care (Signed)
Plan of care discussed with pt.

## 2017-04-07 NOTE — Anesthesia Preprocedure Evaluation (Addendum)
Anesthesia Evaluation  Patient identified by MRN, date of birth, ID band Patient awake    Reviewed: Allergy & Precautions, NPO status , Patient's Chart, lab work & pertinent test results  Airway Mallampati: II  TM Distance: >3 FB Neck ROM: Full    Dental  (+) Teeth Intact, Dental Advisory Given   Pulmonary former smoker,    Pulmonary exam normal breath sounds clear to auscultation       Cardiovascular Exercise Tolerance: Good (-) angina+ CAD and +CHF  Normal cardiovascular exam Rhythm:Regular Rate:Normal  NICM- EF 40-45%, global hypokinesis   Neuro/Psych negative neurological ROS  negative psych ROS   GI/Hepatic negative GI ROS, Neg liver ROS,   Endo/Other  negative endocrine ROS  Renal/GU negative Renal ROS     Musculoskeletal  (+) Arthritis , Osteoarthritis,    Abdominal   Peds  Hematology negative hematology ROS (+)   Anesthesia Other Findings Day of surgery medications reviewed with the patient.  Reproductive/Obstetrics                            Anesthesia Physical Anesthesia Plan  ASA: II  Anesthesia Plan: General   Post-op Pain Management:    Induction: Intravenous  PONV Risk Score and Plan: 2 and Dexamethasone, Ondansetron and Treatment may vary due to age or medical condition  Airway Management Planned: LMA  Additional Equipment:   Intra-op Plan:   Post-operative Plan: Extubation in OR  Informed Consent: I have reviewed the patients History and Physical, chart, labs and discussed the procedure including the risks, benefits and alternatives for the proposed anesthesia with the patient or authorized representative who has indicated his/her understanding and acceptance.   Dental advisory given  Plan Discussed with: CRNA  Anesthesia Plan Comments: (Risks/benefits of general anesthesia discussed with patient including risk of damage to teeth, lips, gum, and tongue,  nausea/vomiting, allergic reactions to medications, and the possibility of heart attack, stroke and death.  All patient questions answered.  Patient wishes to proceed.)        Anesthesia Quick Evaluation

## 2017-04-07 NOTE — Anesthesia Procedure Notes (Signed)
Procedure Name: LMA Insertion Date/Time: 04/07/2017 1:43 PM Performed by: Sharlette Dense, CRNA Patient Re-evaluated:Patient Re-evaluated prior to induction Oxygen Delivery Method: Circle system utilized Preoxygenation: Pre-oxygenation with 100% oxygen Induction Type: IV induction Ventilation: Mask ventilation without difficulty LMA: LMA inserted LMA Size: 4.0 Number of attempts: 1 Placement Confirmation: positive ETCO2 and breath sounds checked- equal and bilateral Tube secured with: Tape Dental Injury: Teeth and Oropharynx as per pre-operative assessment

## 2017-04-07 NOTE — Discharge Instructions (Signed)
Increase your activities as comfort allows. Expect significant left knee pain for a few days. Ice and elevation as needed for swelling. You may put all of your weight on your left knee as tolerated. Use crutches as needed. You may remove your dressings in 24 hours and shower, getting your incisions wet daily. Place band-aids over your incisions daily.

## 2017-04-07 NOTE — Transfer of Care (Signed)
Immediate Anesthesia Transfer of Care Note  Patient: [name] [name]  Procedure(s) Performed: LEFT KNEE ARTHROSCOPY WITH PARTIAL MEDIAL MENISCECTOMY AND MEDIAL TIBIAL SUBCHONDROPLASTY (Left )  Patient Location: PACU  Anesthesia Type:General  Level of Consciousness: awake, alert  and oriented  Airway & Oxygen Therapy: Patient Spontanous Breathing and Patient connected to face mask oxygen  Post-op Assessment: Report given to RN and Post -op Vital signs reviewed and stable  Post vital signs: Reviewed and stable  Last Vitals:  Vitals:   04/07/17 1226  BP: (!) 151/85  Pulse: 82  Resp: 18  Temp: 36.7 C  SpO2: 96%    Last Pain:  Vitals:   04/07/17 1226  TempSrc: Oral         Complications: No apparent anesthesia complications

## 2017-04-07 NOTE — H&P (Signed)
[name] [name] is an 72 y.o. male.   Chief Complaint: Left knee pain with locking and catching HPI: Patient is a 72 year old gentleman with worsening left knee pain.  He reports locking catching in the knee and the knee pain is only on the medial joint line over the medial tibial plateau.  The extreme of flexion and varus stressing the knee causes significant pain.  Is a positive McMurray sign to the medial compartment.  After the failure of conservative treatment with rest, ice, heat, time, walking with assistive device as well as activity modification and a steroid injection he continued to have pain with his knee.  His x-rays still show well-maintained joint space.  An MRI was obtained and showed only mild arthritis in his knee but a significant tear of the posterior horn and root of the medial meniscus on the left knee as well as a significant insufficiency fracture of the medial tibial plateau.  At this point he does wish to proceed with an arthroscopic intervention and this has been recommended.  Past Medical History:  Diagnosis Date  . Arthritis   . BPH (benign prostatic hypertrophy)   . Coronary atherosclerosis of native coronary vessel    CATH 2004--  MINIMAL NONOBSTRUCTIVE LAD/  NORMAL EF/    CARDIAC CT  03/2008  NO SIGNIFICANT DISEASE  AND  nlef  . Elevated PSA   . Eye problem 03/06/2016   [name] retinal detachment left eye, no sx done  . History of gout    pt 05-20-2013 states stable   . History of melanoma excision    2011-- LEFT UPPER BACK  . Hyperlipemia   . Hypertriglyceridemia   . Melanoma ([place]) 2011   back  . Nonischemic cardiomyopathy ([place])    09-04-2009  --  2D echo-EF 40-45%, Global Hypokinesis  . Personal history of arthritis   . Personal history of other diseases of circulatory system     Past Surgical History:  Procedure Laterality Date  . APPENDECTOMY  AS CHILD  . CARDIAC CATHETERIZATION  05/23/2002  &  05-23-1998   DR AL LITTLE   minimal irregularities in  the LAD/  EF 50%  . CARDIOVASCULAR STRESS TEST  10-05-1998   MILD GLOBAL HYPOKINESIS AND ISCHEMIAIN ANTEROSEPTAL  AT [facility]/ EF 42%  . EXCISION MELANOMA LEFT UPPER BACK  10-14-2009  . eye lid surgery Bilateral 2018  . knee injections Bilateral   . LUMBAR DISC SURGERY  09-12-2000   left  L5 -- S1  . PROSTATE BIOPSY N/A 05/22/2013   Procedure: BIOPSY TRANSRECTAL ULTRASONIC PROSTATE (TUBP);  Surgeon: [name], MD;  Location: [facility];  Service: Urology;  Laterality: N/A;  . ROTATOR CUFF REPAIR Right 2016  . TRANSRECTAL ULTRASOUND PROSTATE BX  05-23-2005  &  04-19-2001  . TRANSTHORACIC ECHOCARDIOGRAM  09-04-2009   MILD LVH/  MILD GLOBAL HYPOKINESIS/ LVEF 40-45%/  MILD AV SCLEROSIS WITHOUT STENOSIS/  MILD PVR    Family History  Problem Relation Age of Onset  . Arthritis Mother   . COPD Father   . Cancer Father        lung cancer  . Cancer Sister   . Cardiomyopathy Unknown        idiopathic. trivial disease 2004 cath, 2010 cardiac CT - no sig. dz, nl LV fxn. cards: Little  . Colon cancer Neg Hx    Social History:  reports that he quit smoking about 37 years ago. His smoking use included cigarettes. He has a  40.00 pack-year smoking history. he has never used smokeless tobacco. He reports that he does not drink alcohol or use drugs.  Allergies:  Allergies  Allergen Reactions  . Nitroglycerin Anaphylaxis    "Cardiologist suggested he shouldn't take this med because it could kill him with his heart condition. Lowers BP"  . Ace Inhibitors Swelling  . Lovastatin Nausea Only  . Solarcaine [Benzocaine] Dermatitis    Medications Prior to Admission  Medication Sig Dispense Refill  . aspirin 325 MG tablet Take 325 mg by mouth daily.    . fish oil-omega-3 fatty acids 1000 MG capsule Take 1 g by mouth daily.    Marland Kitchen gabapentin (NEURONTIN) 300 MG capsule Take 1-2 capsules (300-600 mg total) by mouth 2 (two) times daily as needed. 360 capsule 1  . Garlic TABS Take 1 tablet by  mouth daily.    . Multiple Vitamins-Minerals (MULTIVITAMIN PO) Take 1 tablet by mouth daily.    . naproxen sodium (ANAPROX) 220 MG tablet Take 440 mg by mouth 2 (two) times daily as needed (pain).    . polyvinyl alcohol (LIQUIFILM TEARS) 1.4 % ophthalmic solution Place 1 drop into both eyes daily as needed for dry eyes.    . sildenafil (VIAGRA) 100 MG tablet 1/2 to 1 tablet up to each day as needed. (Patient taking differently: Take 50-100 mg by mouth daily as needed for erectile dysfunction. ) 10 tablet 5  . Turmeric Curcumin 500 MG CAPS Take 1 tablet by mouth daily.    Marland Kitchen UNABLE TO FIND Equate cold medicine take 2 caps in am and 2 in pm prn      Results for orders placed or performed in visit on 04/06/17 (from the past 48 hour(s))  Comprehensive metabolic panel     Status: Abnormal   Collection Time: 04/06/17 10:33 AM  Result Value Ref Range   Glucose 120 (H) 65 - 99 mg/dL   BUN 19 8 - 27 mg/dL   Creatinine, Ser 1.16 0.76 - 1.27 mg/dL   GFR calc non Af Amer 63 >59 mL/min/1.73   GFR calc Af Amer 72 >59 mL/min/1.73   BUN/Creatinine Ratio 16 10 - 24   Sodium 141 134 - 144 mmol/L   Potassium 5.1 3.5 - 5.2 mmol/L   Chloride 101 96 - 106 mmol/L   CO2 22 20 - 29 mmol/L   Calcium 9.8 8.6 - 10.2 mg/dL   Total Protein 7.9 6.0 - 8.5 g/dL   Albumin 4.9 (H) 3.5 - 4.8 g/dL   Globulin, Total 3.0 1.5 - 4.5 g/dL   Albumin/Globulin Ratio 1.6 1.2 - 2.2   Bilirubin Total <0.2 0.0 - 1.2 mg/dL   Alkaline Phosphatase 76 39 - 117 IU/L   AST 17 0 - 40 IU/L   ALT 24 0 - 44 IU/L  Lipid panel     Status: Abnormal   Collection Time: 04/06/17 10:33 AM  Result Value Ref Range   Cholesterol, Total 237 (H) 100 - 199 mg/dL   Triglycerides 180 (H) 0 - 149 mg/dL   HDL 41 >39 mg/dL   VLDL Cholesterol Cal 36 5 - 40 mg/dL   LDL Calculated 160 (H) 0 - 99 mg/dL   Chol/HDL Ratio 5.8 (H) 0.0 - 5.0 ratio    Comment:                                   T. Chol/HDL Ratio  Men   Women                               1/2 Avg.Risk  3.4    3.3                                   Avg.Risk  5.0    4.4                                2X Avg.Risk  9.6    7.1                                3X Avg.Risk 23.4   11.0    No results found.  Review of Systems  Musculoskeletal: Positive for joint pain.  All other systems reviewed and are negative.   Blood pressure (!) 151/85, pulse 82, temperature 98 F (36.7 C), temperature source Oral, resp. rate 18, height 5\' 9"  (1.753 m), weight 202 lb (91.6 kg), SpO2 96 %. Physical Exam  Constitutional: He is oriented to person, place, and time. He appears well-developed and well-nourished.  HENT:  Head: Normocephalic and atraumatic.  Eyes: EOM are normal. Pupils are equal, round, and reactive to light.  Neck: Normal range of motion. Neck supple.  Cardiovascular: Normal rate and regular rhythm.  Respiratory: Effort normal and breath sounds normal.  GI: Soft. Bowel sounds are normal.  Musculoskeletal:       Left knee: He exhibits decreased range of motion, swelling, effusion and abnormal alignment. Tenderness found. Medial joint line tenderness noted.  Neurological: He is alert and oriented to person, place, and time.  Skin: Skin is warm and dry.  Psychiatric: He has a normal mood and affect.     Assessment/Plan Left knee with symptomatic posterior horn complex medial meniscal tear and insufficiency stress fracture of the medial tibial plateau  We will proceed to the operating room today for left knee arthroscopy with partial medial meniscectomy and then a subchondral chondroplasty of the left medial tibial plateau to treat the insufficiency fracture of the medial tibial plateau.  The risk and benefits of this have been explained to him in detail and informed consent obtained.  Mcarthur Rossetti, MD 04/07/2017, 1:28 PM

## 2017-04-07 NOTE — Anesthesia Postprocedure Evaluation (Signed)
Anesthesia Post Note  Patient: [name] [name]  Procedure(s) Performed: LEFT KNEE ARTHROSCOPY WITH PARTIAL MEDIAL MENISCECTOMY AND MEDIAL TIBIAL SUBCHONDROPLASTY (Left )     Patient location during evaluation: PACU Anesthesia Type: General Level of consciousness: awake and alert Vital Signs Assessment: post-procedure vital signs reviewed and stable Respiratory status: spontaneous breathing, nonlabored ventilation, respiratory function stable and patient connected to nasal cannula oxygen Cardiovascular status: blood pressure returned to baseline and stable Postop Assessment: no apparent nausea or vomiting Anesthetic complications: no Comments: Admitted to hospital for overnight pain control.  Discussed with surgeon who agreed given findings during surgery.    Last Vitals:  Vitals:   04/07/17 1700 04/07/17 1802  BP: 124/70 136/78  Pulse: 81 94  Resp: 16 17  Temp: 36.7 C 36.8 C  SpO2: 99% 99%    Last Pain:  Vitals:   04/07/17 1802  TempSrc: Oral  PainSc:                  Catalina Gravel

## 2017-04-07 NOTE — Brief Op Note (Signed)
04/07/2017  3:13 PM  PATIENT:  Philis Pique  72 y.o. male  PRE-OPERATIVE DIAGNOSIS:  left knee medial meniscal tear, stress fracture left tibia  POST-OPERATIVE DIAGNOSIS:  left knee medial meniscal tear, stress fracture left tibia  PROCEDURE:  Procedure(s): LEFT KNEE ARTHROSCOPY WITH PARTIAL MEDIAL MENISCECTOMY AND MEDIAL TIBIAL SUBCHONDROPLASTY (Left)  SURGEON:  Surgeon(s) and Role:    Mcarthur Rossetti, MD - Primary  PHYSICIAN ASSISTANT: Benita Stabile, PA-C  ANESTHESIA:   local and general  EBL:  25 mL   COUNTS:  YES  TOURNIQUET:  * No tourniquets in log *  DICTATION: .Other Dictation: Dictation Number [id number]  PLAN OF CARE: Discharge to home after PACU  PATIENT DISPOSITION:  PACU - hemodynamically stable.   Delay start of Pharmacological VTE agent (>24hrs) due to surgical blood loss or risk of bleeding: no

## 2017-04-08 ENCOUNTER — Other Ambulatory Visit: Payer: Self-pay

## 2017-04-08 DIAGNOSIS — N400 Enlarged prostate without lower urinary tract symptoms: Secondary | ICD-10-CM | POA: Diagnosis not present

## 2017-04-08 DIAGNOSIS — S83242A Other tear of medial meniscus, current injury, left knee, initial encounter: Secondary | ICD-10-CM | POA: Diagnosis not present

## 2017-04-08 DIAGNOSIS — I2510 Atherosclerotic heart disease of native coronary artery without angina pectoris: Secondary | ICD-10-CM | POA: Diagnosis not present

## 2017-04-08 DIAGNOSIS — G8929 Other chronic pain: Secondary | ICD-10-CM | POA: Diagnosis not present

## 2017-04-08 DIAGNOSIS — M109 Gout, unspecified: Secondary | ICD-10-CM | POA: Diagnosis not present

## 2017-04-08 DIAGNOSIS — M1990 Unspecified osteoarthritis, unspecified site: Secondary | ICD-10-CM | POA: Diagnosis not present

## 2017-04-08 LAB — BASIC METABOLIC PANEL
Anion gap: 6 (ref 5–15)
BUN: 24 mg/dL — ABNORMAL HIGH (ref 6–20)
CO2: 25 mmol/L (ref 22–32)
Calcium: 8.7 mg/dL — ABNORMAL LOW (ref 8.9–10.3)
Chloride: 104 mmol/L (ref 101–111)
Creatinine, Ser: 1.12 mg/dL (ref 0.61–1.24)
GFR calc Af Amer: >60 mL/min (ref >60)
GFR calc non Af Amer: >60 mL/min (ref >60)
Glucose, Bld: 164 mg/dL — ABNORMAL HIGH (ref 65–99)
Potassium: 4.4 mmol/L (ref 3.5–5.1)
Sodium: 135 mmol/L (ref 135–145)

## 2017-04-08 MED ORDER — OXYCODONE-ACETAMINOPHEN 5-325 MG PO TABS: 1.0000 | ORAL_TABLET | ORAL | 0 refills | Status: DC | PRN

## 2017-04-08 NOTE — Discharge Summary (Signed)
Patient ID: [name] [name] MRN: [id number] DOB/AGE: 1945/07/13 72 y.o.  Admit date: 04/07/2017 Discharge date: 04/08/2017  Admission Diagnoses:  Principal Problem:   Closed nondisplaced fracture of lateral condyle of left tibia with routine healing Active Problems:   Other meniscus derangements, posterior horn of medial meniscus, left knee, insufficency fracture medial tibial plateau   Status post arthroscopy of left knee   Discharge Diagnoses:  Same  Past Medical History:  Diagnosis Date  . Arthritis   . BPH (benign prostatic hypertrophy)   . Coronary atherosclerosis of native coronary vessel    CATH 2004--  MINIMAL NONOBSTRUCTIVE LAD/  NORMAL EF/    CARDIAC CT  03/2008  NO SIGNIFICANT DISEASE  AND  nlef  . Elevated PSA   . Eye problem 03/06/2016   [name] retinal detachment left eye, no sx done  . History of gout    pt 05-20-2013 states stable   . History of melanoma excision    2011-- LEFT UPPER BACK  . Hyperlipemia   . Hypertriglyceridemia   . Melanoma ([place]) 2011   back  . Nonischemic cardiomyopathy ([place])    09-04-2009  --  2D echo-EF 40-45%, Global Hypokinesis  . Personal history of arthritis   . Personal history of other diseases of circulatory system     Surgeries: Procedure(s): LEFT KNEE ARTHROSCOPY WITH PARTIAL MEDIAL MENISCECTOMY AND MEDIAL TIBIAL SUBCHONDROPLASTY on 04/07/2017   Consultants:   Discharged Condition: Improved  Hospital Course: [name] [name] is an 72 y.o. male who was admitted 04/07/2017 for operative treatment ofClosed nondisplaced fracture of lateral condyle of left tibia with routine healing. Patient has severe unremitting pain that affects sleep, daily activities, and work/hobbies. After pre-op clearance the patient was taken to the operating room on 04/07/2017 and underwent  Procedure(s): LEFT KNEE ARTHROSCOPY WITH PARTIAL MEDIAL MENISCECTOMY AND MEDIAL TIBIAL SUBCHONDROPLASTY.    Patient was given perioperative antibiotics:   Anti-infectives (From admission, onward)   Start     Dose/Rate Route Frequency Ordered Stop   04/07/17 1230  ceFAZolin (ANCEF) IVPB 2g/100 mL premix     2 g 200 mL/hr over 30 Minutes Intravenous On call to O.R. 04/07/17 1209 04/07/17 1400       Patient was given sequential compression devices, early ambulation, and chemoprophylaxis to prevent DVT.  Patient benefited maximally from hospital stay and there were no complications.    Recent vital signs:  Patient Vitals for the past 24 hrs:  BP Temp Temp src Pulse Resp SpO2 Height Weight  04/08/17 0605 (!) 110/54 97.6 F (36.4 C) Axillary 77 18 95 % - -  04/08/17 0143 122/61 98.3 F (36.8 C) Axillary 93 18 97 % - -  04/07/17 2004 137/65 98 F (36.7 C) Oral 89 18 97 % - -  04/07/17 1900 130/62 98.2 F (36.8 C) Oral 87 18 99 % - -  04/07/17 1802 136/78 98.2 F (36.8 C) Oral 94 17 99 % - -  04/07/17 1700 124/70 98.1 F (36.7 C) Oral 81 16 99 % 5\' 9"  (1.753 m) 202 lb (91.6 kg)  04/07/17 1654 124/70 98.1 F (36.7 C) - 81 15 99 % - -  04/07/17 1640 - - - 81 - 99 % - -  04/07/17 1630 119/68 98 F (36.7 C) - 78 15 99 % - -  04/07/17 1615 (!) 120/109 - - 87 18 100 % - -  04/07/17 1600 (!) 134/110 - - 86 (!) 21 95 % - -  04/07/17 1545 134/81 - -  83 17 100 % - -  04/07/17 1530 131/75 98 F (36.7 C) - 79 20 100 % - -  04/07/17 1233 - - - - - - 5\' 9"  (1.753 m) 202 lb (91.6 kg)  04/07/17 1226 (!) 151/85 98 F (36.7 C) Oral 82 18 96 % - -     Recent laboratory studies:  Recent Labs    04/06/17 1033 04/08/17 0530  NA 141 135  K 5.1 4.4  CL 101 104  CO2 22 25  BUN 19 24*  CREATININE 1.16 1.12  GLUCOSE 120* 164*  CALCIUM 9.8 8.7*     Discharge Medications:   Allergies as of 04/08/2017      Reactions   Nitroglycerin Anaphylaxis   "Cardiologist suggested he shouldn't take this med because it could kill him with his heart condition. Lowers BP"   Ace Inhibitors Swelling   Lovastatin Nausea Only   Solarcaine [benzocaine]  Dermatitis      Medication List    TAKE these medications   aspirin 325 MG tablet Take 325 mg by mouth daily.   fish oil-omega-3 fatty acids 1000 MG capsule Take 1 g by mouth daily.   gabapentin 300 MG capsule Commonly known as:  NEURONTIN Take 1-2 capsules (300-600 mg total) by mouth 2 (two) times daily as needed.   Garlic Tabs Take 1 tablet by mouth daily.   MULTIVITAMIN PO Take 1 tablet by mouth daily.   naproxen sodium 220 MG tablet Commonly known as:  ALEVE Take 440 mg by mouth 2 (two) times daily as needed (pain).   oxyCODONE-acetaminophen 5-325 MG tablet Commonly known as:  ROXICET Take 1-2 tablets by mouth every 4 (four) hours as needed.   polyvinyl alcohol 1.4 % ophthalmic solution Commonly known as:  LIQUIFILM TEARS Place 1 drop into both eyes daily as needed for dry eyes.   sildenafil 100 MG tablet Commonly known as:  VIAGRA 1/2 to 1 tablet up to each day as needed. What changed:    how much to take  how to take this  when to take this  reasons to take this  additional instructions   Turmeric Curcumin 500 MG Caps Take 1 tablet by mouth daily.   UNABLE TO FIND Equate cold medicine take 2 caps in am and 2 in pm prn            Durable Medical Equipment  (From admission, onward)        Start     Ordered   04/08/17 1110  For home use only DME Walker rolling  Once    Question Answer Comment  Patient needs a walker to treat with the following condition Left medial tibial plateau fracture   Patient needs a walker to treat with the following condition S/P left knee arthroscopy      04/08/17 1113   04/07/17 1707  DME Walker rolling  Once    Question:  Patient needs a walker to treat with the following condition  Answer:  Status post arthroscopy of left knee   04/07/17 1706       Discharge Care Instructions  (From admission, onward)        Start     Ordered   04/08/17 0000  Weight bearing as tolerated     04/08/17 1116   04/08/17  0000  Change dressing    Comments:  Change dressing on Monday, then change the dressing daily with sterile bandaids   You may clean the incision with alcohol  prior to redressing.   04/08/17 1116      Diagnostic Studies: Dg C-arm 1-60 Min-no Report  Result Date: 04/07/2017 Fluoroscopy was utilized by the requesting physician.  No radiographic interpretation.   Dg Knee 2 Views Left  Result Date: 04/07/2017 CLINICAL DATA:  Left knee surgery. EXAM: LEFT KNEE - 3 VIEW COMPARISON:  MRI 02/25/2017. FINDINGS: Metallic probe noted over the proximal tibial metaphysis. No focal bony abnormality identified. Three images obtained. 0 minutes 31 seconds fluoroscopy time. IMPRESSION: Postsurgical changes left knee. Electronically Signed   By: Marcello Moores  Register   On: 04/07/2017 15:19    Disposition: 01-Home or Self Care  Discharge Instructions    Call MD / Call 911   Complete by:  As directed    If you experience chest pain or shortness of breath, CALL 911 and be transported to the hospital emergency room.  If you develop a fever above 101 F, pus (white drainage) or increased drainage or redness at the wound, or calf pain, call your surgeon's office.   Change dressing   Complete by:  As directed    Change dressing on Monday, then change the dressing daily with sterile bandaids   You may clean the incision with alcohol prior to redressing.   Constipation Prevention   Complete by:  As directed    Drink plenty of fluids.  Prune juice and/or coffee may be helpful.  You may use a stool softener, such as Colace (over the counter) 100 mg twice a day.  Use MiraLax (over the counter) for constipation as needed but this may take several days to work.  Mag Citrate --OR-- Milk of Magnesia --OR -- Dulcolax pills/suppositories may also be used but follow directions on the label.   Crutches   Complete by:  As directed    Diet general   Complete by:  As directed    Discharge instructions   Complete by:  As directed     YOU WERE GIVEN A DEVICE CALLED AN INCENTIVE SPIROMETER TO HELP YOU TAKE DEEP BREATHS.  PLEASE USE THIS AT LEAST TEN (10) TIMES EVERY 1-2 HOURS EVERY DAY TO PREVENT PNEUMONIA.   Discharge patient   Complete by:  As directed    Discharge disposition:  01-Home or Self Care   Discharge patient date:  04/07/2017   Discharge patient   Complete by:  As directed    Discharge disposition:  01-Home or Self Care   Discharge patient date:  04/08/2017   Driving restrictions   Complete by:  As directed    No driving for 6 weeks   Increase activity slowly as tolerated   Complete by:  As directed    Lifting restrictions   Complete by:  As directed    No lifting for 6 weeks   Patient may shower   Complete by:  As directed    You may shower over the brown dressing.  Once the dressing is removed you may shower without a dressing once there is no drainage.  Do not wash over the wound.  If drainage remains, cover wound with plastic wrap and then shower.   Walker standard   Complete by:  As directed    Weight bearing as tolerated   Complete by:  As directed       Follow-up Information    Mcarthur Rossetti, MD. Schedule an appointment as soon as possible for a visit in 1 week(s).   Specialty:  Orthopedic Surgery Contact information: [address]  [place] [address] [phone]            Signed: Mcarthur Rossetti 04/08/2017, 11:56 AM

## 2017-04-08 NOTE — Plan of Care (Signed)
Plan of care reviewed and discussed with patient.   

## 2017-04-08 NOTE — Progress Notes (Signed)
Patient ID: [name] [name], male   DOB: Aug 31, 1945, 72 y.o.   MRN: [id number] Feels much better today post left knee scope.  Can fully weight bear.  Can go home today.

## 2017-04-08 NOTE — Care Management Note (Signed)
72 yo with c/c of Left knee pain with locking and catching, s/p L knee arthroscopy with partial medial meniscectomy and medial tibial subchondroplasty. PT is recommending a RW. Met with pt at bedside. He plans to return home with the support of his girlfriend who lives with him. Will contact [facility] for DME referral and pt agreed with agency. Contacted Reggie at [facility] for referral.

## 2017-04-08 NOTE — Evaluation (Signed)
Physical Therapy Evaluation Patient Details Name: [name] [name] MRN: [id number] DOB: [birth year] Today's Date: 04/08/2017   History of Present Illness  s/p LEFT KNEE ARTHROSCOPY WITH PARTIAL MEDIAL MENISCECTOMY AND MEDIAL TIBIAL SUBCHONDROPLASTY   Clinical Impression  Pt is s/P L knee surgery as noted above, resulting in the deficits listed below (see PT Problem List).  Pt will benefit from skilled PT to increase their independence and safety with mobility to allow discharge to the venue listed below. Pt is mildly impulsive and will benefit from supervision for mobility for  safety at home; he reports his caregiver is a retired Therapist, sports;      Follow Up Recommendations DC plan and follow up therapy as arranged by surgeon, supervision for mobility    Equipment Recommendations  Rolling walker with 5" wheels    Recommendations for Other Services       Precautions / Restrictions Precautions Precautions: Fall;Knee Restrictions Weight Bearing Restrictions: No Other Position/Activity Restrictions: WBAT      Mobility  Bed Mobility Overal bed mobility: Needs Assistance Bed Mobility: Supine to Sit     Supine to sit: Supervision     General bed mobility comments: for safety, miildly impulsive  Transfers Overall transfer level: Needs assistance Equipment used: Rolling walker (2 wheeled) Transfers: Sit to/from Stand Sit to Stand: Min guard         General transfer comment: cues for hand placement and overall safety  Ambulation/Gait Ambulation/Gait assistance: Min guard Ambulation Distance (Feet): 200 Feet Assistive device: Rolling walker (2 wheeled) Gait Pattern/deviations: Step-through pattern;Trunk flexed     General Gait Details: cues for RW safety and position;   Stairs Stairs: Yes Stairs assistance: Min guard Stair Management: Two rails;Alternating pattern;One rail Right Number of Stairs: 3 General stair comments: cues for sequence and safety  Wheelchair Mobility     Modified Rankin (Stroke Patients Only)       Balance                                             Pertinent Vitals/Pain Pain Assessment: 0-10 Pain Score: 2  Pain Location: left knee Pain Descriptors / Indicators: Sore Pain Intervention(s): Premedicated before session;Monitored during session;Limited activity within patient's tolerance    Home Living Family/patient expects to be discharged to:: Private residence Living Arrangements: Alone(staying at girlfriend's home) Available Help at Discharge: Family Type of Home: House Home Access: Stairs to enter Entrance Stairs-Rails: Right Entrance Stairs-Number of Steps: 5 to 6 Home Layout: Two level;Able to live on main level with bedroom/bathroom Home Equipment: None      Prior Function Level of Independence: Independent               Hand Dominance        Extremity/Trunk Assessment   Upper Extremity Assessment Upper Extremity Assessment: Defer to OT evaluation    Lower Extremity Assessment Lower Extremity Assessment: LLE deficits/detail LLE Deficits / Details: ankle WFL; KNee ~AAROM 5* to 95*;  knee extension and hip flexion 3/5       Communication   Communication: No difficulties  Cognition Arousal/Alertness: Awake/alert Behavior During Therapy: WFL for tasks assessed/performed Overall Cognitive Status: Within Functional Limits for tasks assessed  General Comments      Exercises     Assessment/Plan    PT Assessment All further PT needs can be met in the next venue of care  PT Problem List         PT Treatment Interventions      PT Goals (Current goals can be found in the Care Plan section)  Acute Rehab PT Goals Patient Stated Goal: home soon PT Goal Formulation: All assessment and education complete, DC therapy    Frequency     Barriers to discharge        Co-evaluation               AM-PAC PT "6 Clicks"  Daily Activity  Outcome Measure Difficulty turning over in bed (including adjusting bedclothes, sheets and blankets)?: A Little Difficulty moving from lying on back to sitting on the side of the bed? : A Little Difficulty sitting down on and standing up from a chair with arms (e.g., wheelchair, bedside commode, etc,.)?: Unable Help needed moving to and from a bed to chair (including a wheelchair)?: A Little Help needed walking in hospital room?: A Little Help needed climbing 3-5 steps with a railing? : A Little 6 Click Score: 16    End of Session Equipment Utilized During Treatment: Gait belt Activity Tolerance: Patient tolerated treatment well Patient left: in chair;with chair alarm set;with call bell/phone within reach   PT Visit Diagnosis: Difficulty in walking, not elsewhere classified (R26.2)    Time: 2952-8413 PT Time Calculation (min) (ACUTE ONLY): 24 min   Charges:   PT Evaluation $PT Eval Low Complexity: 1 Low PT Treatments $Gait Training: 8-22 mins   PT G Codes:          Dionisia Pacholski 2017-04-28, 12:51 PM

## 2017-04-10 ENCOUNTER — Encounter (HOSPITAL_COMMUNITY): Payer: Self-pay | Admitting: Orthopaedic Surgery

## 2017-04-10 NOTE — Op Note (Signed)
NAME:  [name] [name], [name] [name]                      ACCOUNT NO.:  MEDICAL RECORD NO.:  [id number]  LOCATION:                                 FACILITY:  PHYSICIAN:  Lind Guest. Ninfa Linden, M.D.DATE OF BIRTH:  DATE OF PROCEDURE:  04/07/2017 DATE OF DISCHARGE:                              OPERATIVE REPORT   PREOPERATIVE DIAGNOSIS: 1. Left knee posterior horn medial meniscal tear. 2. Left knee medial tibial plateau insufficiency fracture.  POSTOPERATIVE DIAGNOSES: 1. Left knee posterior horn medial meniscal tear. 2. Left knee medial tibial plateau insufficiency fracture.  PROCEDURE: 1. Left knee arthroscopy with debridement and partial medial     meniscectomy. 2. Arthroscopically assisted subchondroplasty left knee medial tibial     plateau.  SURGEON:  Lind Guest. Ninfa Linden, M.D.  ASSISTANT:  Erskine Emery, PA-C.  ANESTHESIA: 1. General. 2. Local with mixture of morphine and Marcaine.  ANTIBIOTICS:  IV Ancef 2 g.  BLOOD LOSS:  Minimal.  COMPLICATIONS:  None.  INDICATIONS:  [name] [name] is a very pleasant 72 year old gentleman, who is very active individual.  We have been seeing him for chronic left knee pain and after very conservative treatment including rest, ice, heat, anti-inflammatories, activity modification, offloading his knee with an assisted device as well as steroid injection, he continued to have pain in his knee.  His x-rays actually showed no significant arthritic changes.  We obtained an MRI of the knee and it did show medial compartment arthritic changes, which was only mild with posterior horn medial meniscal root tear.  This also involved the posterior horn.  He also had a wide area of extension subchondral insufficiency fracture of the medial tibial plateau with edema throughout the medial tibial plateau.  Given his continued pain and failure of conservative treatment, we recommended arthroscopic intervention with a knee arthroscopy to perform a partial medial  meniscectomy and debridement as needed as well as arthroscopically assisted subchondroplasty in the medial tibial plateau to fill this area with calcium phosphate cement to build up the medial tibial plateau.  The risks and benefits of the surgery explained to him in detail and he did wish to proceed.  PROCEDURE DESCRIPTION:  After informed consent was obtained, appropriate left knee was marked.  He was brought to the operating room, placed supine on the operating table where general anesthesia was then obtained.  His left leg was prepped and draped from the thigh down to the ankle with DuraPrep and sterile drapes including a sterile stockinette with the bed raised and a lateral leg post utilized, flexing the off the side of the table.  A time-out was called and he was identified as correct patient and correct left knee.  We then made anterolateral arthroscopy portal, inserted a cannula in the knee.  We then placed the camera into the knee went to the medial compartment.  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  Through an anterior medial arthroscopy portal, we performed a partial medial meniscectomy using arthroscopic shaver as well as up cutting and straight biters to debride this back to  a stable margin.  We also performed a chondroplasty of the medial femoral condyle with the shaver.  The lateral compartment was pristine as well as the intercondylar area.  His ACL and PCL were intact.  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  Once we were done with the arthroscopic portion of the case under direct fluoroscopy, we were able to target a subchondroplasty needle and placed this into the tibial plateau verifying its placement under direct fluoroscopy and visualization.  We then filled the medial tibial plateau with calcium pyrophosphate material to build up  the tibial plateau.  Once that had hardened after 8 to 10 minutes, we removed that pin.  We did place the arthroscope back into the knee to look at the medial compartment and found no extrusion of the calcium pyrophosphate.  We then allowed fluid to lavage the knee and then drained all fluid from the knee.  We closed the portal sites with interrupted nylon suture and placed a mixture of morphine and Marcaine into the knee.  Xeroform, well-padded sterile dressing were applied.  He was awakened, extubated, and taken to the recovery room in stable condition.  All final counts were correct, and there were no complications noted.  Postoperatively, we will allow him to weightbear as tolerated and we will see him back in the office in a week.     Lind Guest. Ninfa Linden, M.D.     CYB/MEDQ  D:  04/07/2017  T:  04/08/2017  Job:  [id number]

## 2017-04-13 ENCOUNTER — Encounter (INDEPENDENT_AMBULATORY_CARE_PROVIDER_SITE_OTHER): Payer: Self-pay | Admitting: Physician Assistant

## 2017-04-13 ENCOUNTER — Ambulatory Visit (INDEPENDENT_AMBULATORY_CARE_PROVIDER_SITE_OTHER): Payer: Medicare Other | Admitting: Physician Assistant

## 2017-04-13 DIAGNOSIS — M23322 Other meniscus derangements, posterior horn of medial meniscus, left knee: Secondary | ICD-10-CM

## 2017-04-13 NOTE — Progress Notes (Signed)
Office Visit Note   Patient: [name] [name]           Date of Birth: 1945-04-26           MRN: [id number] Visit Date: 04/13/2017              Requested by: [name], MD [address] [place], [place] [address] PCP: [name], MD   Assessment & Plan: Visit Diagnoses:  1. Other meniscus derangements, posterior horn of medial meniscus, left knee, insufficency fracture medial tibial plateau     Plan:  Gentle range of motion of the knee weightbearing as tolerated no high impact activities.  He will follow-up with [place] in 4 weeks we will get an AP and lateral view of his left knee at that time.  Scar tissue mobilization encouraged. Follow-Up Instructions: Return in about 4 weeks (around 05/11/2017) for Radiographs AP ana Lateral left knee.   Orders:  No orders of the defined types were placed in this encounter.  No orders of the defined types were placed in this encounter.     Procedures: No procedures performed   Clinical Data: No additional findings.   Subjective: Chief Complaint  Patient presents with  . Left Knee - Routine Post Op    HPI [name] [name] returns today status post left knee arthroscopy with a debridement and partial medial meniscectomy and arthroscopic assisted subchondral plasty left medial tibial plateau.  He is overall doing well he states he still has some pain but his pain is much better than it was prior to surgery.  He is having no chest pain shortness breath fevers chills. Review of Systems See HPI otherwise negative  Objective: Vital Signs: There were no vitals taken for this visit.  Physical Exam  Constitutional: He is oriented to person, place, and time. He appears well-developed and well-nourished. No distress.  Neurological: He is alert and oriented to person, place, and time.  Skin: He is not diaphoretic.  Psychiatric: He has a normal mood and affect.    Ortho Exam Left knee full extension flexion to at least 90 degrees.  He  has minimal tenderness along the medial joint line.  No effusion abnormal warmth erythema calf supple nontender.  Port sites are healing well and well approximated with nylon sutures.  No signs of infection. Specialty Comments:  No specialty comments available.  Imaging: No results found.   PMFS History: Patient Active Problem List   Diagnosis Date Noted  . Other meniscus derangements, posterior horn of medial meniscus, left knee, insufficency fracture medial tibial plateau 04/07/2017  . Closed nondisplaced fracture of lateral condyle of left tibia with routine healing 04/07/2017  . Status post arthroscopy of left knee 04/07/2017  . Insomnia 09/14/2015  . Hyperlipidemia with target LDL less than 130 09/11/2014  . Rotator cuff tendinitis 04/28/2014  . Obesity (BMI 30-39.9) 12/29/2013  . 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  . Exertional shortness of breath 06/26/2012  . Polyp of colon, adenomatous 04/27/2012  . Elevated PSA 10/20/2011  . Hypertriglyceridemia 07/08/2011  . Nonischemic cardiomyopathy ([place]) 09/04/2009   Past Medical History:  Diagnosis Date  . Arthritis   . BPH (benign prostatic hypertrophy)   . Coronary atherosclerosis of native coronary vessel    CATH 2004--  MINIMAL NONOBSTRUCTIVE LAD/  NORMAL EF/    CARDIAC CT  03/2008  NO SIGNIFICANT DISEASE  AND  nlef  . Elevated PSA   . Eye problem 03/06/2016   [name] retinal detachment left  eye, no sx done  . History of gout    pt 05-20-2013 states stable   . History of melanoma excision    2011-- LEFT UPPER BACK  . Hyperlipemia   . Hypertriglyceridemia   . Melanoma ([place]) 2011   back  . Nonischemic cardiomyopathy ([place])    09-04-2009  --  2D echo-EF 40-45%, Global Hypokinesis  . Personal history of arthritis   . Personal history of other diseases of circulatory system     Family History  Problem Relation Age of Onset  . Arthritis Mother   . COPD Father   . Cancer Father        lung cancer  . Cancer  Sister   . Cardiomyopathy Unknown        idiopathic. trivial disease 2004 cath, 2010 cardiac CT - no sig. dz, nl LV fxn. cards: Little  . Colon cancer Neg Hx     Past Surgical History:  Procedure Laterality Date  . APPENDECTOMY  AS CHILD  . CARDIAC CATHETERIZATION  05/23/2002  &  05-23-1998   DR AL LITTLE   minimal irregularities in the LAD/  EF 50%  . CARDIOVASCULAR STRESS TEST  10-05-1998   MILD GLOBAL HYPOKINESIS AND ISCHEMIAIN ANTEROSEPTAL  AT [facility]/ EF 42%  . EXCISION MELANOMA LEFT UPPER BACK  10-14-2009  . eye lid surgery Bilateral 2018  . KNEE ARTHROSCOPY WITH SUBCHONDROPLASTY Left 04/07/2017   Procedure: LEFT KNEE ARTHROSCOPY WITH PARTIAL MEDIAL MENISCECTOMY AND MEDIAL TIBIAL SUBCHONDROPLASTY;  Surgeon: Mcarthur Rossetti, MD;  Location: [facility];  Service: Orthopedics;  Laterality: Left;  . knee injections Bilateral   . LUMBAR DISC SURGERY  09-12-2000   left  L5 -- S1  . PROSTATE BIOPSY N/A 05/22/2013   Procedure: BIOPSY TRANSRECTAL ULTRASONIC PROSTATE (TUBP);  Surgeon: [name], MD;  Location[facility];  Service: Urology;  Laterality: N/A;  . ROTATOR CUFF REPAIR Right 2016  . TRANSRECTAL ULTRASOUND PROSTATE BX  05-23-2005  &  04-19-2001  . TRANSTHORACIC ECHOCARDIOGRAM  09-04-2009   MILD LVH/  MILD GLOBAL HYPOKINESIS/ LVEF 40-45%/  MILD AV SCLEROSIS WITHOUT STENOSIS/  MILD PVR   Social History   Occupational History  . 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  Tobacco Use  . Smoking status: Former Smoker    Packs/day: 2.00    Years: 20.00    Pack years: 40.00    Types: Cigarettes    Last attempt to quit: 03/28/1980    Years since quitting: 37.0  . Smokeless tobacco: Never Used  Substance and Sexual Activity  . Alcohol use: No    Alcohol/week: 0.0 oz  . Drug use: No  . Sexual activity: Not on file

## 2017-05-15 ENCOUNTER — Ambulatory Visit (INDEPENDENT_AMBULATORY_CARE_PROVIDER_SITE_OTHER): Payer: Medicare Other | Admitting: Physician Assistant

## 2017-05-15 ENCOUNTER — Encounter (INDEPENDENT_AMBULATORY_CARE_PROVIDER_SITE_OTHER): Payer: Self-pay | Admitting: Physician Assistant

## 2017-05-15 ENCOUNTER — Ambulatory Visit (INDEPENDENT_AMBULATORY_CARE_PROVIDER_SITE_OTHER): Payer: Medicare Other

## 2017-05-15 DIAGNOSIS — M25562 Pain in left knee: Secondary | ICD-10-CM

## 2017-05-15 NOTE — Progress Notes (Signed)
Office Visit Note   Patient: [name] [name]           Date of Birth: Jan 21, 1946           MRN: [id number] Visit Date: 05/15/2017              Requested by: [name], MD [address] [place], [place] [address] PCP: [name], MD   Assessment & Plan: Visit Diagnoses:  1. Left knee pain, unspecified chronicity     Plan: We will have him this follow-up with [place] in 3 months is to check his overall progress.  He will continue quad strengthening.  Activities as tolerated.  Discussed with him the lateral numbness should continue to resolve as it has since surgery. No radiographs at that time.   Follow-Up Instructions: Return in about 3 months (around 08/12/2017).   Orders:  Orders Placed This Encounter  Procedures  . XR Knee 1-2 Views Left   No orders of the defined types were placed in this encounter.     Procedures: No procedures performed   Clinical Data: No additional findings.   Subjective: Chief Complaint  Patient presents with  . Left Knee - Follow-up    HPI [name] returns today follow-up of his left knee status post left knee arthroscopy with partial medial meniscectomy and medial tibial supple chondroplasty.  He states overall that he is doing well.  He does have some discomfort if he walks for long period of time.  He does note some numbness lateral aspect of his left leg which is slowly resolving. Review of Systems No fevers chills shortness of breath chest pain. Objective: Vital Signs: There were no vitals taken for this visit.  Physical Exam Well-developed well-nourished male in no acute distress   Ortho Exam Left knee full extension full flexion.  No tenderness along medial joint line particularly over the posterior tibial plateau medially.  Calf supple nontender.  Subjective decreased sensation over the lateral aspect of the knee.  No instability valgus varus stressing. Specialty Comments:  No specialty comments  available.  Imaging: Xr Knee 1-2 Views Left  Result Date: 05/15/2017 AP/lateral left knee: No acute fracture. Medial posterior proximal tibial increased density at area of subchondralplasty. Mild narrowing medial joint line.     PMFS History: Patient Active Problem List   Diagnosis Date Noted  . Other meniscus derangements, posterior horn of medial meniscus, left knee, insufficency fracture medial tibial plateau 04/07/2017  . Closed nondisplaced fracture of lateral condyle of left tibia with routine healing 04/07/2017  . Status post arthroscopy of left knee 04/07/2017  . Insomnia 09/14/2015  . Hyperlipidemia with target LDL less than 130 09/11/2014  . Rotator cuff tendinitis 04/28/2014  . Obesity (BMI 30-39.9) 12/29/2013  . 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  . Exertional shortness of breath 06/26/2012  . Polyp of colon, adenomatous 04/27/2012  . Elevated PSA 10/20/2011  . Hypertriglyceridemia 07/08/2011  . Nonischemic cardiomyopathy ([place]) 09/04/2009   Past Medical History:  Diagnosis Date  . Arthritis   . BPH (benign prostatic hypertrophy)   . Coronary atherosclerosis of native coronary vessel    CATH 2004--  MINIMAL NONOBSTRUCTIVE LAD/  NORMAL EF/    CARDIAC CT  03/2008  NO SIGNIFICANT DISEASE  AND  nlef  . Elevated PSA   . Eye problem 03/06/2016   [name] retinal detachment left eye, no sx done  . History of gout    pt 05-20-2013 states stable   . History  of melanoma excision    2011-- LEFT UPPER BACK  . Hyperlipemia   . Hypertriglyceridemia   . Melanoma ([place]) 2011   back  . Nonischemic cardiomyopathy ([place])    09-04-2009  --  2D echo-EF 40-45%, Global Hypokinesis  . Personal history of arthritis   . Personal history of other diseases of circulatory system     Family History  Problem Relation Age of Onset  . Arthritis Mother   . COPD Father   . Cancer Father        lung cancer  . Cancer Sister   . Cardiomyopathy Unknown        idiopathic. trivial  disease 2004 cath, 2010 cardiac CT - no sig. dz, nl LV fxn. cards: Little  . Colon cancer Neg Hx     Past Surgical History:  Procedure Laterality Date  . APPENDECTOMY  AS CHILD  . CARDIAC CATHETERIZATION  05/23/2002  &  05-23-1998   DR AL LITTLE   minimal irregularities in the LAD/  EF 50%  . CARDIOVASCULAR STRESS TEST  10-05-1998   MILD GLOBAL HYPOKINESIS AND ISCHEMIAIN ANTEROSEPTAL  AT [facility]/ EF 42%  . EXCISION MELANOMA LEFT UPPER BACK  10-14-2009  . eye lid surgery Bilateral 2018  . KNEE ARTHROSCOPY WITH SUBCHONDROPLASTY Left 04/07/2017   Procedure: LEFT KNEE ARTHROSCOPY WITH PARTIAL MEDIAL MENISCECTOMY AND MEDIAL TIBIAL SUBCHONDROPLASTY;  Surgeon: Mcarthur Rossetti, MD;  Location: [facility];  Service: Orthopedics;  Laterality: Left;  . knee injections Bilateral   . LUMBAR DISC SURGERY  09-12-2000   left  L5 -- S1  . PROSTATE BIOPSY N/A 05/22/2013   Procedure: BIOPSY TRANSRECTAL ULTRASONIC PROSTATE (TUBP);  Surgeon: [name], MD;  Location[facility];  Service: Urology;  Laterality: N/A;  . ROTATOR CUFF REPAIR Right 2016  . TRANSRECTAL ULTRASOUND PROSTATE BX  05-23-2005  &  04-19-2001  . TRANSTHORACIC ECHOCARDIOGRAM  09-04-2009   MILD LVH/  MILD GLOBAL HYPOKINESIS/ LVEF 40-45%/  MILD AV SCLEROSIS WITHOUT STENOSIS/  MILD PVR   Social History   Occupational History  . 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  Tobacco Use  . Smoking status: Former Smoker    Packs/day: 2.00    Years: 20.00    Pack years: 40.00    Types: Cigarettes    Last attempt to quit: 03/28/1980    Years since quitting: 37.1  . Smokeless tobacco: Never Used  Substance and Sexual Activity  . Alcohol use: No    Alcohol/week: 0.0 oz  . Drug use: No  . Sexual activity: Not on file

## 2017-07-11 DIAGNOSIS — D225 Melanocytic nevi of trunk: Secondary | ICD-10-CM | POA: Diagnosis not present

## 2017-07-11 DIAGNOSIS — Z85820 Personal history of malignant melanoma of skin: Secondary | ICD-10-CM | POA: Diagnosis not present

## 2017-07-11 DIAGNOSIS — L814 Other melanin hyperpigmentation: Secondary | ICD-10-CM | POA: Diagnosis not present

## 2017-07-11 DIAGNOSIS — L570 Actinic keratosis: Secondary | ICD-10-CM | POA: Diagnosis not present

## 2017-07-11 DIAGNOSIS — L821 Other seborrheic keratosis: Secondary | ICD-10-CM | POA: Diagnosis not present

## 2017-07-11 DIAGNOSIS — L820 Inflamed seborrheic keratosis: Secondary | ICD-10-CM | POA: Diagnosis not present

## 2017-07-31 DIAGNOSIS — H2513 Age-related nuclear cataract, bilateral: Secondary | ICD-10-CM | POA: Diagnosis not present

## 2017-07-31 DIAGNOSIS — H02831 Dermatochalasis of right upper eyelid: Secondary | ICD-10-CM | POA: Diagnosis not present

## 2017-07-31 DIAGNOSIS — H02834 Dermatochalasis of left upper eyelid: Secondary | ICD-10-CM | POA: Diagnosis not present

## 2017-07-31 DIAGNOSIS — H43812 Vitreous degeneration, left eye: Secondary | ICD-10-CM | POA: Diagnosis not present

## 2017-08-08 ENCOUNTER — Ambulatory Visit (INDEPENDENT_AMBULATORY_CARE_PROVIDER_SITE_OTHER): Payer: Medicare Other | Admitting: Family Medicine

## 2017-08-08 ENCOUNTER — Encounter: Payer: Self-pay | Admitting: Family Medicine

## 2017-08-08 VITALS — BP 108/50 | HR 76 | Temp 98.6°F | Ht 68.0 in | Wt 195.6 lb

## 2017-08-08 DIAGNOSIS — E785 Hyperlipidemia, unspecified: Secondary | ICD-10-CM | POA: Diagnosis not present

## 2017-08-08 DIAGNOSIS — R739 Hyperglycemia, unspecified: Secondary | ICD-10-CM | POA: Diagnosis not present

## 2017-08-08 DIAGNOSIS — M7989 Other specified soft tissue disorders: Secondary | ICD-10-CM

## 2017-08-08 DIAGNOSIS — R200 Anesthesia of skin: Secondary | ICD-10-CM | POA: Diagnosis not present

## 2017-08-08 DIAGNOSIS — I8392 Asymptomatic varicose veins of left lower extremity: Secondary | ICD-10-CM

## 2017-08-08 NOTE — Progress Notes (Signed)
Subjective:  By signing my name below, I, Moises Blood, attest that this documentation has been prepared under the direction and in the presence of [name], MD. Electronically Signed: Moises Blood, [place]. 08/08/2017 , 2:07 PM .  Patient was seen in Room 1 .   Patient ID: [name] [name], male    DOB: October 08, 1945, 72 y.o.   MRN: [id number] Chief Complaint  Patient presents with  . chonic conditions    f/u with fasting labs  . hand and arm numbness    both hands and arms and going on for 6-8 months/ sea sickness patches?   HPI [name] [name] is a 72 y.o. male  Here for follow up, but also complaining of hands and arms numbness for the past 6 months.   Upper extremities numbness He did have minimal spondylosis and disc disease C3-4 on C-spine xray in 2015. He does take gabapentin for peripheral neuropathy.   He reports intermittent bilateral upper extremity numbness. He states it occurs sporadically, and doesn't happen with specific activity. Generally, numbness can occur up to 2-3 times a day, but in the past 16 hours, he hasn't felt any numbness. He has had neck stiffness recently, thought due to wrong sleep posture. He has done some general ROM exercise for his neck. He hasn't taken medication, or applied heat or ice over the area. He denies weakness in his upper extremities.   Calf swelling He also mentions noticing intermittent left calf swelling in the past few weeks. He denies any calf pain. He denies shortness of breath or chest pain. He has a surgical history of left knee arthroscopy.   Decreased BP He notes lowered BP on triage. He mentions still taking Viagra, including last night. He denies taking other BP medications.   Sea sickness patches He also requested patches for sea sickness as he's planning to go on a cruise in June.   Hyperlipidemia Lab Results  Component Value Date   CHOL 237 (H) 04/06/2017   HDL 41 04/06/2017   LDLCALC 160 (H) 04/06/2017   TRIG 180  (H) 04/06/2017   CHOLHDL 5.8 (H) 04/06/2017   Lab Results  Component Value Date   ALT 24 04/06/2017   AST 17 04/06/2017   ALKPHOS 76 04/06/2017   BILITOT <0.2 04/06/2017   He is followed by cardiology for history of cardiomyopathy. He was intolerant to lovastatin. He was taking fish oil when discussed in January. We discussed statins again at last visit, and also advised to discuss with his cardiologist as he declined that medication.   He has an appointment with cardiology in a few weeks. He's still taking fish oil and has adjusted his weight.   Wt Readings from Last 3 Encounters:  08/08/17 195 lb 9.6 oz (88.7 kg)  04/07/17 202 lb (91.6 kg)  04/06/17 202 lb (91.6 kg)    The 10-year ASCVD risk score Mikey Bussing DC Jr., et al., 2013) is: 19.2%   Values used to calculate the score:     Age: 33 years     Sex: Male     Is Non-Hispanic African American: No     Diabetic: No     Tobacco smoker: No     Systolic Blood Pressure: 539 mmHg     Is BP treated: No     HDL Cholesterol: 41 mg/dL     Total Cholesterol: 237 mg/dL  Peripheral neuropathy He takes gabapentin 300mg  1-2 pills TID PRN, but at last visit, he reports taking up  to 2 pills BID, but only when more active, not everyday. He states he has cramps in his legs and into his arms as well. He takes up to 6 pills a day if he's more active, but sometimes he goes for a whole week without taking any.   Hyperglycemia Mildly elevated on previous visits, up to a max of 164 on Jan 12th. He denies urinary frequency.   Patient Active Problem List   Diagnosis Date Noted  . Other meniscus derangements, posterior horn of medial meniscus, left knee, insufficency fracture medial tibial plateau 04/07/2017  . Closed nondisplaced fracture of lateral condyle of left tibia with routine healing 04/07/2017  . Status post arthroscopy of left knee 04/07/2017  . Insomnia 09/14/2015  . Hyperlipidemia with target LDL less than 130 09/11/2014  . Rotator cuff  tendinitis 04/28/2014  . Obesity (BMI 30-39.9) 12/29/2013  . 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  . Exertional shortness of breath 06/26/2012  . Polyp of colon, adenomatous 04/27/2012  . Elevated PSA 10/20/2011  . Hypertriglyceridemia 07/08/2011  . Nonischemic cardiomyopathy ([place]) 09/04/2009   Past Medical History:  Diagnosis Date  . Arthritis   . BPH (benign prostatic hypertrophy)   . Coronary atherosclerosis of native coronary vessel    CATH 2004--  MINIMAL NONOBSTRUCTIVE LAD/  NORMAL EF/    CARDIAC CT  03/2008  NO SIGNIFICANT DISEASE  AND  nlef  . Elevated PSA   . Eye problem 03/06/2016   [name] retinal detachment left eye, no sx done  . History of gout    pt 05-20-2013 states stable   . History of melanoma excision    2011-- LEFT UPPER BACK  . Hyperlipemia   . Hypertriglyceridemia   . Melanoma ([place]) 2011   back  . Nonischemic cardiomyopathy ([place])    09-04-2009  --  2D echo-EF 40-45%, Global Hypokinesis  . Personal history of arthritis   . Personal history of other diseases of circulatory system    Past Surgical History:  Procedure Laterality Date  . APPENDECTOMY  AS CHILD  . CARDIAC CATHETERIZATION  05/23/2002  &  05-23-1998   DR AL LITTLE   minimal irregularities in the LAD/  EF 50%  . CARDIOVASCULAR STRESS TEST  10-05-1998   MILD GLOBAL HYPOKINESIS AND ISCHEMIAIN ANTEROSEPTAL  AT [facility]/ EF 42%  . EXCISION MELANOMA LEFT UPPER BACK  10-14-2009  . eye lid surgery Bilateral 2018  . KNEE ARTHROSCOPY WITH SUBCHONDROPLASTY Left 04/07/2017   Procedure: LEFT KNEE ARTHROSCOPY WITH PARTIAL MEDIAL MENISCECTOMY AND MEDIAL TIBIAL SUBCHONDROPLASTY;  Surgeon: Mcarthur Rossetti, MD;  Location: [facility];  Service: Orthopedics;  Laterality: Left;  . knee injections Bilateral   . LUMBAR DISC SURGERY  09-12-2000   left  L5 -- S1  . PROSTATE BIOPSY N/A 05/22/2013   Procedure: BIOPSY TRANSRECTAL ULTRASONIC PROSTATE (TUBP);  Surgeon: [name], MD;  Location: [facility];  Service: Urology;  Laterality: N/A;  . ROTATOR CUFF REPAIR Right 2016  . TRANSRECTAL ULTRASOUND PROSTATE BX  05-23-2005  &  04-19-2001  . TRANSTHORACIC ECHOCARDIOGRAM  09-04-2009   MILD LVH/  MILD GLOBAL HYPOKINESIS/ LVEF 40-45%/  MILD AV SCLEROSIS WITHOUT STENOSIS/  MILD PVR   Allergies  Allergen Reactions  . Nitroglycerin Anaphylaxis    "Cardiologist suggested he shouldn't take this med because it could kill him with his heart condition. Lowers BP"  . Ace Inhibitors Swelling  . Lovastatin Nausea Only  . Solarcaine [Benzocaine] Dermatitis   Prior to Admission medications  Medication Sig Start Date End Date Taking? Authorizing Provider  aspirin 325 MG tablet Take 325 mg by mouth daily.    [provider]  fish oil-omega-3 fatty acids 1000 MG capsule Take 1 g by mouth daily.    [provider]  gabapentin (NEURONTIN) 300 MG capsule Take 1-2 capsules (300-600 mg total) by mouth 2 (two) times daily as needed. 04/06/17   [name], MD  Garlic TABS Take 1 tablet by mouth daily.    [provider]  Multiple Vitamins-Minerals (MULTIVITAMIN PO) Take 1 tablet by mouth daily.    [provider]  naproxen sodium (ANAPROX) 220 MG tablet Take 440 mg by mouth 2 (two) times daily as needed (pain).    [provider]  oxyCODONE-acetaminophen (ROXICET) 5-325 MG tablet Take 1-2 tablets by mouth every 4 (four) hours as needed. 04/08/17   Mcarthur Rossetti, MD  polyvinyl alcohol (LIQUIFILM TEARS) 1.4 % ophthalmic solution Place 1 drop into both eyes daily as needed for dry eyes.    [provider]  sildenafil (VIAGRA) 100 MG tablet 1/2 to 1 tablet up to each day as needed. Patient taking differently: Take 50-100 mg by mouth daily as needed for erectile dysfunction.  07/08/11   [name], MD  Turmeric Curcumin 500 MG CAPS Take 1 tablet by mouth daily.    [provider]  UNABLE TO FIND Equate cold medicine take  2 caps in am and 2 in pm prn    [provider]   Social History   Socioeconomic History  . Marital status: Widowed    Spouse name: Not on file  . Number of children: Not on file  . Years of education: Not on file  . Highest education level: Not on file  Occupational History  . 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  Social Needs  . Financial resource strain: Not on file  . Food insecurity:    Worry: Not on file    Inability: Not on file  . Transportation needs:    Medical: Not on file    Non-medical: Not on file  Tobacco Use  . Smoking status: Former Smoker    Packs/day: 2.00    Years: 20.00    Pack years: 40.00    Types: Cigarettes    Last attempt to quit: 03/28/1980    Years since quitting: 37.3  . Smokeless tobacco: Never Used  Substance and Sexual Activity  . Alcohol use: No    Alcohol/week: 0.0 oz  . Drug use: No  . Sexual activity: Not on file  Lifestyle  . Physical activity:    Days per week: Not on file    Minutes per session: Not on file  . Stress: Not on file  Relationships  . Social connections:    Talks on phone: Not on file    Gets together: Not on file    Attends religious service: Not on file    Active member of club or organization: Not on file    Attends meetings of clubs or organizations: Not on file    Relationship status: Not on file  . Intimate partner violence:    Fear of current or ex partner: Not on file    Emotionally abused: Not on file    Physically abused: Not on file    Forced sexual activity: Not on file  Other Topics Concern  . Not on file  Social History  Narrative   He walks on a relatively regular basis about 15 minutes at a time mostly to the discomfort. He previously had walked up a 30-40 minutes a time without problems. Education: [employer].   Review of Systems  Constitutional: Negative for fatigue and unexpected weight change.  Eyes: Negative  for visual disturbance.  Respiratory: Negative for cough, chest tightness and shortness of breath.   Cardiovascular: Positive for leg swelling. Negative for chest pain and palpitations.  Gastrointestinal: Negative for abdominal pain and blood in stool.  Musculoskeletal: Positive for neck stiffness. Negative for arthralgias, gait problem, joint swelling and myalgias.  Neurological: Positive for numbness. Negative for dizziness, weakness, light-headedness and headaches.       Objective:   Physical Exam  Constitutional: He is oriented to person, place, and time. He appears well-developed and well-nourished. No distress.  HENT:  Head: Normocephalic and atraumatic.  Eyes: Pupils are equal, round, and reactive to light. EOM are normal.  Neck: Neck supple.  Nonpalpable thyroid  Cardiovascular: Normal rate.  Pulmonary/Chest: Effort normal. No respiratory distress.  Musculoskeletal: Normal range of motion.  Left leg: some notable varicose veins left medial calf, non tender Right leg: no varicosities over right calf Calf circumference, 15cm below patella:  Right - 39cm  Left - 38cm C-spine: limited in rotation bilaterally, L>R Equal grip strength bilaterally, equal strength in upper extremities bilaterally  Neurological: He is alert and oriented to person, place, and time.  Reflex Scores:      Tricep reflexes are 1+ on the right side and 1+ on the left side.      Bicep reflexes are 1+ on the right side and 2+ on the left side.      Brachioradialis reflexes are 2+ on the right side and 2+ on the left side. Skin: Skin is warm and dry.  Psychiatric: He has a normal mood and affect. His behavior is normal.  Nursing note and vitals reviewed.   Vitals:   08/08/17 1332  BP: (!) 108/50  Pulse: 76  Temp: 98.6 F (37 C)  TempSrc: Oral  SpO2: 97%  Weight: 195 lb 9.6 oz (88.7 kg)  Height: 5\' 8"  (1.727 m)       Assessment & Plan:   [name] [name] is a 72 y.o. male Hyperglycemia - Plan:  Hemoglobin A1c  -Prior hyperglycemia with some increased readings more recently, check A1c to screen for diabetes.  Left arm numbness - Plan: TSH Right arm numbness - Plan: TSH, Vitamin B12  -Possibly related to cervical spine with other neuropathy.  Check TSH, B12, continue gabapentin for now.  If not improving in the next few weeks, or obvious signs on testing above, consider imaging next visit  Hyperlipidemia, unspecified hyperlipidemia type - Plan: Lipid panel, Comprehensive metabolic panel Based on prior ASCVD risk, we have discussed statin medication.  Will repeat labs, but can discuss further at his upcoming cardiology appointment as statin likely will be recommended or can discuss other medication options.  Calf swelling Less likely DVT, suspect some varicosities as well as intermittent swelling from prior knee surgery.  Varicose veins of left lower extremity, unspecified whether complicated - Plan: VAS [place] LOWER EXTREMITY VENOUS (DVT)  -However with prior surgery and reports of intermittent swelling/soreness at area of varicosities will check Doppler to rule out thrombosis.  Borderline low blood pressure, did take Viagra last night.  With history of idiopathic retinopathy, advised to avoid Viagra for now until he has been cleared to use that by  his cardiologist,   And then would recommend not exceeding 50 mg initially.    Plan on follow-up in the next few weeks prior to him going to town, can discuss medication for seasickness at that time.   No orders of the defined types were placed in this encounter.  Patient Instructions   I will check blood sugar testing today to scree for diabetes.   I will check cholesterol level again today as weight has gone down, but if elevated I would like you to discuss treatment options with cardiology at your upcoming visit.   For arm numbness, I will check some blood tests but those symptpoms may be due to your neck.  Ok to continue gabapentin as  needed. Try gentle range of motion of neck, moist heat or ice to neck if needed and recheck in next 2 weeks to decide if imaging is needed. Return to the clinic or go to the nearest emergency room if any of your symptoms worsen or new symptoms occur.  For your leg swelling in left leg I will order an ultrasound to screen for blood clot, but less likely.   Follow up with cardiologist as planned. Return to the clinic or go to the nearest emergency room if any of your symptoms worsen or new symptoms occur.  We can discuss med for travel at follow up in 2 weeks.   Blood pressure on low side - may be due to viagra last night. Do not use that medicine until discussed with cardiologist to make sure it is safe to use - even at low dose.   IF you received an x-ray today, you will receive an invoice from [facility]. Please contact [facility] at [phone] with questions or concerns regarding your invoice.   IF you received labwork today, you will receive an invoice from [facility]. Please contact [facility] at [phone] with questions or concerns regarding your invoice.   Our billing staff will not be able to assist you with questions regarding bills from these companies.  You will be contacted with the lab results as soon as they are available. The fastest way to get your results is to activate your My Chart account. Instructions are located on the last page of this paperwork. If you have not heard from [place] regarding the results in 2 weeks, please contact this office.      I personally performed the services described in this documentation, which was scribed in my presence. The recorded information has been reviewed and considered for accuracy and completeness, addended by me as needed, and agree with information above.  Signed,   [name], MD [facility].  08/09/17 4:18 PM

## 2017-08-08 NOTE — Patient Instructions (Addendum)
I will check blood sugar testing today to scree for diabetes.   I will check cholesterol level again today as weight has gone down, but if elevated I would like you to discuss treatment options with cardiology at your upcoming visit.   For arm numbness, I will check some blood tests but those symptpoms may be due to your neck.  Ok to continue gabapentin as needed. Try gentle range of motion of neck, moist heat or ice to neck if needed and recheck in next 2 weeks to decide if imaging is needed. Return to the clinic or go to the nearest emergency room if any of your symptoms worsen or new symptoms occur.  For your leg swelling in left leg I will order an ultrasound to screen for blood clot, but less likely.   Follow up with cardiologist as planned. Return to the clinic or go to the nearest emergency room if any of your symptoms worsen or new symptoms occur.  We can discuss med for travel at follow up in 2 weeks.   Blood pressure on low side - may be due to viagra last night. Do not use that medicine until discussed with cardiologist to make sure it is safe to use - even at low dose.   IF you received an x-ray today, you will receive an invoice from [facility]. Please contact [facility] at [phone] with questions or concerns regarding your invoice.   IF you received labwork today, you will receive an invoice from [facility]. Please contact [facility] at [phone] with questions or concerns regarding your invoice.   Our billing staff will not be able to assist you with questions regarding bills from these companies.  You will be contacted with the lab results as soon as they are available. The fastest way to get your results is to activate your My Chart account. Instructions are located on the last page of this paperwork. If you have not heard from [place] regarding the results in 2 weeks, please contact this office.

## 2017-08-09 ENCOUNTER — Encounter: Payer: Self-pay | Admitting: Family Medicine

## 2017-08-09 LAB — COMPREHENSIVE METABOLIC PANEL
ALT: 14 IU/L (ref 0–44)
AST: 16 IU/L (ref 0–40)
Albumin/Globulin Ratio: 1.7 (ref 1.2–2.2)
Albumin: 4.5 g/dL (ref 3.5–4.8)
Alkaline Phosphatase: 68 IU/L (ref 39–117)
BUN/Creatinine Ratio: 23 (ref 10–24)
BUN: 24 mg/dL (ref 8–27)
Bilirubin Total: 0.4 mg/dL (ref 0.0–1.2)
CO2: 25 mmol/L (ref 20–29)
Calcium: 9.3 mg/dL (ref 8.6–10.2)
Chloride: 105 mmol/L (ref 96–106)
Creatinine, Ser: 1.04 mg/dL (ref 0.76–1.27)
GFR calc Af Amer: 82 mL/min/{1.73_m2} (ref >59)
GFR calc non Af Amer: 71 mL/min/{1.73_m2} (ref >59)
Globulin, Total: 2.7 g/dL (ref 1.5–4.5)
Glucose: 88 mg/dL (ref 65–99)
Potassium: 4.2 mmol/L (ref 3.5–5.2)
Sodium: 142 mmol/L (ref 134–144)
Total Protein: 7.2 g/dL (ref 6.0–8.5)

## 2017-08-09 LAB — HEMOGLOBIN A1C
Est. average glucose Bld gHb Est-mCnc: 126 mg/dL
Hgb A1c MFr Bld: 6 % — ABNORMAL HIGH (ref 4.8–5.6)

## 2017-08-09 LAB — LIPID PANEL
Chol/HDL Ratio: 5.9 ratio — ABNORMAL HIGH (ref 0.0–5.0)
Cholesterol, Total: 200 mg/dL — ABNORMAL HIGH (ref 100–199)
HDL: 34 mg/dL — ABNORMAL LOW (ref >39)
LDL Calculated: 126 mg/dL — ABNORMAL HIGH (ref 0–99)
Triglycerides: 200 mg/dL — ABNORMAL HIGH (ref 0–149)
VLDL Cholesterol Cal: 40 mg/dL (ref 5–40)

## 2017-08-09 LAB — VITAMIN B12: Vitamin B-12: 1136 pg/mL (ref 232–1245)

## 2017-08-09 LAB — TSH: TSH: 1.28 u[IU]/mL (ref 0.450–4.500)

## 2017-08-10 ENCOUNTER — Encounter (INDEPENDENT_AMBULATORY_CARE_PROVIDER_SITE_OTHER): Payer: Self-pay | Admitting: Physician Assistant

## 2017-08-10 ENCOUNTER — Ambulatory Visit (INDEPENDENT_AMBULATORY_CARE_PROVIDER_SITE_OTHER): Payer: Medicare Other | Admitting: Physician Assistant

## 2017-08-10 DIAGNOSIS — M1712 Unilateral primary osteoarthritis, left knee: Secondary | ICD-10-CM

## 2017-08-10 DIAGNOSIS — M23322 Other meniscus derangements, posterior horn of medial meniscus, left knee: Secondary | ICD-10-CM | POA: Diagnosis not present

## 2017-08-10 MED ORDER — LIDOCAINE HCL 1 % IJ SOLN
3.0000 mL | INTRAMUSCULAR | Status: AC | PRN
  Administered 2017-08-10: 3 mL

## 2017-08-10 MED ORDER — METHYLPREDNISOLONE ACETATE 40 MG/ML IJ SUSP
40.0000 mg | INTRAMUSCULAR | Status: AC | PRN
  Administered 2017-08-10: 40 mg via INTRA_ARTICULAR

## 2017-08-10 NOTE — Progress Notes (Signed)
   Procedure Note  Patient: [name] [name]             Date of Birth: May 10, 1945           MRN: [id number]             Visit Date: 08/10/2017   HPI: [name] [name] returns 4 months status post left knee arthroscopy with partial medial meniscectomy and medial tibial sub-chondroplasty.  He was doing well but now is having increasing pain in the knee particularly medial aspect with prolonged walking.  He is having no mechanical symptoms in the.  He has had some swelling of his left lower leg but no chest pain shortness of breath he has a Doppler ordered for next week by his primary care physician.  He is taking some Aleve and feels the gabapentin also helps.  He still has some numbness lateral aspect of the knee.  States he is very disappointed in fact that the knee is beginning to hurt again. Review of systems please see HPI otherwise negative  Physical exam: Left knee has full extension flexion of the knee.  No effusion abnormal warmth.  Port sites are well-healed.  No instability valgus varus stressing.  Tenderness over the medial joint line.  Calf is supple minimally tender.  Procedures: Visit Diagnoses: Other meniscus derangements, posterior horn of medial meniscus, left knee, insufficency fracture medial tibial plateau  Primary osteoarthritis of left knee  Large Joint Inj on 08/10/2017 5:14 PM Indications: pain Details: 22 G 1.5 in needle, anterolateral approach  Arthrogram: No  Medications: 3 mL lidocaine 1 %; 40 mg methylPREDNISolone acetate 40 MG/ML Outcome: tolerated well, no immediate complications Procedure, treatment alternatives, risks and benefits explained, specific risks discussed. Consent was given by the patient. Immediately prior to procedure a time out was called to verify the correct patient, procedure, equipment, support staff and site/side marked as required. Patient was prepped and draped in the usual sterile fashion.     Plan: We will have him follow-up with [place] in 4 weeks  check his response to the injection.  Discussed quad strengthening.  He may benefit from supplemental injection of the knee in the future.  Questions are encouraged and answered.

## 2017-08-15 ENCOUNTER — Encounter: Payer: Self-pay | Admitting: *Deleted

## 2017-08-15 ENCOUNTER — Encounter (HOSPITAL_COMMUNITY): Payer: Medicare Other

## 2017-08-15 ENCOUNTER — Telehealth: Payer: Self-pay | Admitting: Family Medicine

## 2017-08-15 ENCOUNTER — Ambulatory Visit (HOSPITAL_COMMUNITY)
Admission: RE | Admit: 2017-08-15 | Discharge: 2017-08-15 | Disposition: A | Discharge status: Home / Self Care | Payer: Medicare Other | Source: Ambulatory Visit | Attending: Family Medicine | Admitting: Family Medicine

## 2017-08-15 DIAGNOSIS — I8392 Asymptomatic varicose veins of left lower extremity: Secondary | ICD-10-CM

## 2017-08-15 DIAGNOSIS — Z9889 Other specified postprocedural states: Secondary | ICD-10-CM | POA: Diagnosis not present

## 2017-08-15 DIAGNOSIS — M7989 Other specified soft tissue disorders: Secondary | ICD-10-CM | POA: Diagnosis not present

## 2017-08-15 NOTE — Progress Notes (Signed)
Preliminary notes--Left lower extremity venous duplex exam completed. Negative for DVT. Result called ordering physician's office, spoke to [employer].  Patient was instructed go home.   Hongying Latrica Clowers (RDMS RVT) 08/15/17 9:25 AM

## 2017-08-15 NOTE — Telephone Encounter (Signed)
Incoming call transferred from call center. Spoke with [name] [name] from [facility] vascular lab.  Patient's vascular study is negative for DVT.  Provider, Juluis Rainier.

## 2017-08-22 ENCOUNTER — Ambulatory Visit (INDEPENDENT_AMBULATORY_CARE_PROVIDER_SITE_OTHER): Payer: Medicare Other | Admitting: Family Medicine

## 2017-08-22 ENCOUNTER — Encounter: Payer: Self-pay | Admitting: Family Medicine

## 2017-08-22 VITALS — BP 118/60 | HR 85 | Temp 97.9°F | Ht 69.0 in | Wt 195.2 lb

## 2017-08-22 DIAGNOSIS — R112 Nausea with vomiting, unspecified: Secondary | ICD-10-CM | POA: Diagnosis not present

## 2017-08-22 DIAGNOSIS — H9202 Otalgia, left ear: Secondary | ICD-10-CM

## 2017-08-22 DIAGNOSIS — T753XXA Motion sickness, initial encounter: Secondary | ICD-10-CM | POA: Diagnosis not present

## 2017-08-22 DIAGNOSIS — R7303 Prediabetes: Secondary | ICD-10-CM

## 2017-08-22 DIAGNOSIS — R208 Other disturbances of skin sensation: Secondary | ICD-10-CM | POA: Diagnosis not present

## 2017-08-22 MED ORDER — ONDANSETRON 4 MG PO TBDP: 4.0000 mg | ORAL_TABLET | Freq: Three times a day (TID) | ORAL | 0 refills | Status: DC | PRN

## 2017-08-22 MED ORDER — SCOPOLAMINE 1 MG/3DAYS TD PT72: 1.0000 | MEDICATED_PATCH | TRANSDERMAL | 0 refills | Status: DC

## 2017-08-22 NOTE — Patient Instructions (Addendum)
For sea sickness, can try the transderm scopalamine, but that can worsen prostate symptoms. Please discuss this medicine with your urologist to make sure they are ok with you using it. Another option is over the counter meclizine (Bonine).  I will write a few nausea pills (ondansetron) if you do experience nausea and vomiting.   For tingling in hands, that may be due to neck problems. You can take tylenol if needed for neck soreness on occasion, as well as your gabapentin. Follow up in next 6 weeks. Return to the clinic or go to the nearest emergency room if any of your symptoms worsen or new symptoms occur.  Most recent blood test indicated prediabetes. Watch diet, exercise and recheck levels in 3-6 months.   Left ear looks ok today, but if that gets more sore - please have someone examine that ear again.    Prediabetes Prediabetes is the condition of having a blood sugar (blood glucose) level that is higher than it should be, but not high enough for you to be diagnosed with type 2 diabetes. Having prediabetes puts you at risk for developing type 2 diabetes (type 2 diabetes mellitus). Prediabetes may be called impaired glucose tolerance or impaired fasting glucose. Prediabetes usually does not cause symptoms. Your health care provider can diagnose this condition with blood tests. You may be tested for prediabetes if you are overweight and if you have at least one other risk factor for prediabetes. Risk factors for prediabetes include:  Having a family member with type 2 diabetes.  Being overweight or obese.  Being older than age 72.  Being of American-Indian, African-American, Hispanic/Latino, or Asian/Pacific Islander descent.  Having an inactive (sedentary) lifestyle.  Having a history of gestational diabetes or polycystic ovarian syndrome (PCOS).  Having low levels of good cholesterol (HDL-C) or high levels of blood fats (triglycerides).  Having high blood pressure.  What is blood  glucose and how is blood glucose measured?  Blood glucose refers to the amount of glucose in your bloodstream. Glucose comes from eating foods that contain sugars and starches (carbohydrates) that the body breaks down into glucose. Your blood glucose level may be measured in mg/dL (milligrams per deciliter) or mmol/L (millimoles per liter).Your blood glucose may be checked with one or more of the following blood tests:  A fasting blood glucose (FBG) test. You will not be allowed to eat (you will fast) for at least 8 hours before a blood sample is taken. ? A normal range for FBG is 70-100 mg/dl (3.9-5.6 mmol/L).  An A1c (hemoglobin A1c) blood test. This test provides information about blood glucose control over the previous 2?85months.  An oral glucose tolerance test (OGTT). This test measures your blood glucose twice: ? After fasting. This is your baseline level. ? Two hours after you drink a beverage that contains glucose.  You may be diagnosed with prediabetes:  If your FBG is 100?125 mg/dL (5.6-6.9 mmol/L).  If your A1c level is 5.7?6.4%.  If your OGGT result is 140?199 mg/dL (7.8-11 mmol/L).  These blood tests may be repeated to confirm your diagnosis. What happens if blood glucose is too high? The pancreas produces a hormone (insulin) that helps move glucose from the bloodstream into cells. When cells in the body do not respond properly to insulin that the body makes (insulin resistance), excess glucose builds up in the blood instead of going into cells. As a result, high blood glucose (hyperglycemia) can develop, which can cause many complications. This is a symptom  of prediabetes. What can happen if blood glucose stays higher than normal for a long time? Having high blood glucose for a long time is dangerous. Too much glucose in your blood can damage your nerves and blood vessels. Long-term damage can lead to complications from diabetes, which may include:  Heart  disease.  Stroke.  Blindness.  Kidney disease.  Depression.  Poor circulation in the feet and legs, which could lead to surgical removal (amputation) in severe cases.  How can prediabetes be prevented from turning into type 2 diabetes?  To help prevent type 2 diabetes, take the following actions:  Be physically active. ? Do moderate-intensity physical activity for at least 30 minutes on at least 5 days of the week, or as much as told by your health care provider. This could be brisk walking, biking, or water aerobics. ? Ask your health care provider what activities are safe for you. A mix of physical activities may be best, such as walking, swimming, cycling, and strength training.  Lose weight as told by your health care provider. ? Losing 5-7% of your body weight can reverse insulin resistance. ? Your health care provider can determine how much weight loss is best for you and can help you lose weight safely.  Follow a healthy meal plan. This includes eating lean proteins, complex carbohydrates, fresh fruits and vegetables, low-fat dairy products, and healthy fats. ? Follow instructions from your health care provider about eating or drinking restrictions. ? Make an appointment to see a diet and nutrition specialist (registered dietitian) to help you create a healthy eating plan that is right for you.  Do not smoke or use any tobacco products, such as cigarettes, chewing tobacco, and e-cigarettes. If you need help quitting, ask your health care provider.  Take over-the-counter and prescription medicines as told by your health care provider. You may be prescribed medicines that help lower the risk of type 2 diabetes.  This information is not intended to replace advice given to you by your health care provider. Make sure you discuss any questions you have with your health care provider. Document Released: 07/06/2015 Document Revised: 08/20/2015 Document Reviewed: 05/05/2015 [employer]vier  [employer] Patient Education  2018 [employer].      IF you received an x-ray today, you will receive an invoice from [facility]. Please contact [facility] at [phone] with questions or concerns regarding your invoice.   IF you received labwork today, you will receive an invoice from [facility]. Please contact [facility] at [phone] with questions or concerns regarding your invoice.   Our billing staff will not be able to assist you with questions regarding bills from these companies.  You will be contacted with the lab results as soon as they are available. The fastest way to get your results is to activate your My Chart account. Instructions are located on the last page of this paperwork. If you have not heard from [place] regarding the results in 2 weeks, please contact this office.

## 2017-08-22 NOTE — Progress Notes (Signed)
Subjective:  By signing my name below, I, Moises Blood, attest that this documentation has been prepared under the direction and in the presence of [name], MD. Electronically Signed: Moises Blood, [place]. 08/22/2017 , 12:16 PM .  Patient was seen in Room 14 .   Patient ID: WAI LITT, male    DOB: 11-23-1945, 72 y.o.   MRN: [id number] Chief Complaint  Patient presents with  . lab results and med fill    discuss labs and wants to get medication for motion sickness for he is going on cruize soon  . hands and arms falling asleep    going on for a year   HPI DEVONNE LALANI is a 72 y.o. male  Here for follow up. He was see on May 14th with multiple concerns. He reported dysesthesia in hands and arms for some time now. He had degenerative changes in C-spine in 2015. He takes gabapentin for peripheral neuropathy.   Motion sickness He's used patches in the past for motion sickness. He's planning to travel to [place] for a 7-day cruise. He normally gets sea sick with nausea and vomiting. He does have history of prostate issues, followed by urologist, Dr. Risa Grill. He denies history of glaucoma. He's leaving for his trip on June 22nd.   Numbness and tingling - bilateral hands He reports intermittent tingling and numbness in bilateral hands. He's noticed it about once-twice since his last visit. He notes tingling occurring while driving, and also when he's laying in bed, lasting for a few minutes. He tries to exercise to get the blood flowing. He denies having much neck pain. He denies weakness.   Left ear issues He mentions having some left ear problems. He believes it was caused by tooth problem. He is followed by dentist.   Patient Active Problem List   Diagnosis Date Noted  . Other meniscus derangements, posterior horn of medial meniscus, left knee, insufficency fracture medial tibial plateau 04/07/2017  . Closed nondisplaced fracture of lateral condyle of left tibia with routine  healing 04/07/2017  . Status post arthroscopy of left knee 04/07/2017  . Insomnia 09/14/2015  . Hyperlipidemia with target LDL less than 130 09/11/2014  . Rotator cuff tendinitis 04/28/2014  . Obesity (BMI 30-39.9) 12/29/2013  . 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  . Exertional shortness of breath 06/26/2012  . Polyp of colon, adenomatous 04/27/2012  . Elevated PSA 10/20/2011  . Hypertriglyceridemia 07/08/2011  . Nonischemic cardiomyopathy ([facility]) 09/04/2009   Past Medical History:  Diagnosis Date  . Arthritis   . BPH (benign prostatic hypertrophy)   . Coronary atherosclerosis of native coronary vessel    CATH 2004--  MINIMAL NONOBSTRUCTIVE LAD/  NORMAL EF/    CARDIAC CT  03/2008  NO SIGNIFICANT DISEASE  AND  nlef  . Elevated PSA   . Eye problem 03/06/2016   [name] retinal detachment left eye, no sx done  . History of gout    pt 05-20-2013 states stable   . History of melanoma excision    2011-- LEFT UPPER BACK  . Hyperlipemia   . Hypertriglyceridemia   . Melanoma ([facility]) 2011   back  . Nonischemic cardiomyopathy ([facility])    09-04-2009  --  2D echo-EF 40-45%, Global Hypokinesis  . Personal history of arthritis   . Personal history of other diseases of circulatory system    Past Surgical History:  Procedure Laterality Date  . APPENDECTOMY  AS CHILD  . CARDIAC CATHETERIZATION  05/23/2002  &  05-23-1998   DR AL LITTLE   minimal irregularities in the LAD/  EF 50%  . CARDIOVASCULAR STRESS TEST  10-05-1998   MILD GLOBAL HYPOKINESIS AND ISCHEMIAIN ANTEROSEPTAL  AT [facility]/ EF 42%  . EXCISION MELANOMA LEFT UPPER BACK  10-14-2009  . eye lid surgery Bilateral 2018  . KNEE ARTHROSCOPY WITH SUBCHONDROPLASTY Left 04/07/2017   Procedure: LEFT KNEE ARTHROSCOPY WITH PARTIAL MEDIAL MENISCECTOMY AND MEDIAL TIBIAL SUBCHONDROPLASTY;  Surgeon: Mcarthur Rossetti, MD;  Location: [facility];  Service: Orthopedics;  Laterality: Left;  . knee injections Bilateral   . LUMBAR DISC SURGERY   09-12-2000   left  L5 -- S1  . PROSTATE BIOPSY N/A 05/22/2013   Procedure: BIOPSY TRANSRECTAL ULTRASONIC PROSTATE (TUBP);  Surgeon: [name], MD;  Location: [facility];  Service: Urology;  Laterality: N/A;  . ROTATOR CUFF REPAIR Right 2016  . TRANSRECTAL ULTRASOUND PROSTATE BX  05-23-2005  &  04-19-2001  . TRANSTHORACIC ECHOCARDIOGRAM  09-04-2009   MILD LVH/  MILD GLOBAL HYPOKINESIS/ LVEF 40-45%/  MILD AV SCLEROSIS WITHOUT STENOSIS/  MILD PVR   Allergies  Allergen Reactions  . Nitroglycerin Anaphylaxis    "Cardiologist suggested he shouldn't take this med because it could kill him with his heart condition. Lowers BP"  . Ace Inhibitors Swelling  . Lovastatin Nausea Only  . Solarcaine [Benzocaine] Dermatitis   Prior to Admission medications   Medication Sig Start Date End Date Taking? Authorizing Provider  aspirin 325 MG tablet Take 325 mg by mouth daily.    [provider]  fish oil-omega-3 fatty acids 1000 MG capsule Take 1 g by mouth daily.    [provider]  gabapentin (NEURONTIN) 300 MG capsule Take 1-2 capsules (300-600 mg total) by mouth 2 (two) times daily as needed. 04/06/17   [name], MD  Garlic TABS Take 1 tablet by mouth daily.    [provider]  Multiple Vitamins-Minerals (MULTIVITAMIN PO) Take 1 tablet by mouth daily.    [provider]  naproxen sodium (ANAPROX) 220 MG tablet Take 440 mg by mouth 2 (two) times daily as needed (pain).    [provider]  polyvinyl alcohol (LIQUIFILM TEARS) 1.4 % ophthalmic solution Place 1 drop into both eyes daily as needed for dry eyes.    [provider]  sildenafil (VIAGRA) 100 MG tablet 1/2 to 1 tablet up to each day as needed. Patient taking differently: Take 50-100 mg by mouth daily as needed for erectile dysfunction.  07/08/11   [name], MD  Turmeric Curcumin 500 MG CAPS Take 1 tablet by mouth daily.    [provider]  UNABLE TO  FIND Equate cold medicine take 2 caps in am and 2 in pm prn    [provider]   Social History   Socioeconomic History  . Marital status: Widowed    Spouse name: Not on file  . Number of children: Not on file  . Years of education: Not on file  . Highest education level: Not on file  Occupational History  . 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  Social Needs  . Financial resource strain: Not on file  . Food insecurity:    Worry: Not on file    Inability: Not on file  . Transportation needs:    Medical: Not on file    Non-medical: Not on file  Tobacco Use  . Smoking status: Former Smoker  Packs/day: 2.00    Years: 20.00    Pack years: 40.00    Types: Cigarettes    Last attempt to quit: 03/28/1980    Years since quitting: 37.4  . Smokeless tobacco: Never Used  Substance and Sexual Activity  . Alcohol use: No    Alcohol/week: 0.0 oz  . Drug use: No  . Sexual activity: Not on file  Lifestyle  . Physical activity:    Days per week: Not on file    Minutes per session: Not on file  . Stress: Not on file  Relationships  . Social connections:    Talks on phone: Not on file    Gets together: Not on file    Attends religious service: Not on file    Active member of club or organization: Not on file    Attends meetings of clubs or organizations: Not on file    Relationship status: Not on file  . Intimate partner violence:    Fear of current or ex partner: Not on file    Emotionally abused: Not on file    Physically abused: Not on file    Forced sexual activity: Not on file  Other Topics Concern  . Not on file  Social History Narrative   He walks on a relatively regular basis about 15 minutes at a time mostly to the discomfort. He previously had walked up a 30-40 minutes a time without problems. Education: [employer].   Review of Systems  Constitutional: Negative for fatigue and unexpected  weight change.  Eyes: Negative for visual disturbance.  Respiratory: Negative for cough, chest tightness and shortness of breath.   Cardiovascular: Negative for chest pain, palpitations and leg swelling.  Gastrointestinal: Negative for abdominal pain and blood in stool.  Musculoskeletal: Negative for neck pain.  Neurological: Positive for numbness. Negative for dizziness, weakness, light-headedness and headaches.       Objective:   Physical Exam  Constitutional: He is oriented to person, place, and time. He appears well-developed and well-nourished.  HENT:  Head: Normocephalic and atraumatic.  Right Ear: Tympanic membrane and ear canal normal.  Left Ear: Tympanic membrane and ear canal normal. Tympanic membrane is not erythematous.  No middle ear effusion.  Left ear: canal overall clear, visualized TM pearly gray  Eyes: Pupils are equal, round, and reactive to light. EOM are normal.  Neck: No JVD present. Carotid bruit is not present.  Cardiovascular: Normal rate, regular rhythm and normal heart sounds. Exam reveals no gallop and no friction rub.  No murmur heard. Pulmonary/Chest: Effort normal and breath sounds normal. No respiratory distress. He has no rales.  Musculoskeletal: He exhibits no edema.  C-spine: minimal extension, minimal left rotation, slightly improved right rotation, equal grip strength bilaterally, triceps and bicep strength intact bilaterally, finger tips are warm  Neurological: He is alert and oriented to person, place, and time.  Reflex Scores:      Tricep reflexes are 1+ on the right side and 1+ on the left side.      Bicep reflexes are 2+ on the right side and 2+ on the left side.      Brachioradialis reflexes are 2+ on the right side and 2+ on the left side. Skin: Skin is warm and dry. Capillary refill takes less than 2 seconds.  Psychiatric: He has a normal mood and affect.  Vitals reviewed.   Vitals:   08/22/17 1054  BP: 118/60  Pulse: 85  Temp: 97.9  F (36.6 C)  TempSrc: Oral  SpO2: 97%  Weight: 195 lb 3.2 oz (88.5 kg)  Height: 5\' 9"  (1.753 m)       Assessment & Plan:    XIAN ALVES is a 72 y.o. male Motion sickness, initial encounter - Plan: scopolamine (TRANSDERM-SCOP, 1.5 MG,) 1 MG/3DAYS, ondansetron (ZOFRAN ODT) 4 MG disintegrating tablet  Requests Transderm scopolamine.  Potential impact on prostate/urination was discussed as well as other anticholinergic side effects.  I did prescribe that medication but would like him to get that cleared by his urologist first.  Also discussed meclizine as option.  Zofran prescribed if he does experience motion sickness and has nausea/vomiting as experienced previously.  Understanding expressed of plan  Non-intractable vomiting with nausea, unspecified vomiting type - Plan: ondansetron (ZOFRAN ODT) 4 MG disintegrating tablet  -As above for motion sickness.  Dysesthesia  -Episodic, asymptomatic currently.  Could still be related to his previous peripheral neuropathy versus cervical radiculopathy/degenerative disc disease.  Episodic Tylenol okay, and continue gabapentin as needed.  Recheck if the symptoms persist once he has returned from vacation, sooner if worse.  Left ear pain May be related to his recent dental issues. Ear appears normal on exam at present.   - RTC precautions as well as follow-up with dentist if symptoms persist.  Prediabetes  -Diet/exercise discussed, handout given regarding prediabetes, echeck levels in the next 3 to 6 months.  Meds ordered this encounter  Medications  . scopolamine (TRANSDERM-SCOP, 1.5 MG,) 1 MG/3DAYS    Sig: Place 1 patch (1.5 mg total) onto the skin every 3 (three) days.    Dispense:  3 patch    Refill:  0  . ondansetron (ZOFRAN ODT) 4 MG disintegrating tablet    Sig: Take 1 tablet (4 mg total) by mouth every 8 (eight) hours as needed for nausea or vomiting.    Dispense:  10 tablet    Refill:  0   Patient Instructions    For sea  sickness, can try the transderm scopalamine, but that can worsen prostate symptoms. Please discuss this medicine with your urologist to make sure they are ok with you using it. Another option is over the counter meclizine ([place]).  I will write a few nausea pills (ondansetron) if you do experience nausea and vomiting.   For tingling in hands, that may be due to neck problems. You can take tylenol if needed for neck soreness on occasion, as well as your gabapentin. Follow up in next 6 weeks. Return to the clinic or go to the nearest emergency room if any of your symptoms worsen or new symptoms occur.  Most recent blood test indicated prediabetes. Watch diet, exercise and recheck levels in 3-6 months.   Left ear looks ok today, but if that gets more sore - please have someone examine that ear again.    Prediabetes Prediabetes is the condition of having a blood sugar (blood glucose) level that is higher than it should be, but not high enough for you to be diagnosed with type 2 diabetes. Having prediabetes puts you at risk for developing type 2 diabetes (type 2 diabetes mellitus). Prediabetes may be called impaired glucose tolerance or impaired fasting glucose. Prediabetes usually does not cause symptoms. Your health care provider can diagnose this condition with blood tests. You may be tested for prediabetes if you are overweight and if you have at least one other risk factor for prediabetes. Risk factors for prediabetes include:  Having a family member with type 2 diabetes.  Being overweight or obese.  Being older than age 3.  Being of American-Indian, African-American, Hispanic/Latino, or Asian/Pacific Islander descent.  Having an inactive (sedentary) lifestyle.  Having a history of gestational diabetes or polycystic ovarian syndrome (PCOS).  Having low levels of good cholesterol (HDL-C) or high levels of blood fats (triglycerides).  Having high blood pressure.  What is blood glucose  and how is blood glucose measured?  Blood glucose refers to the amount of glucose in your bloodstream. Glucose comes from eating foods that contain sugars and starches (carbohydrates) that the body breaks down into glucose. Your blood glucose level may be measured in mg/dL (milligrams per deciliter) or mmol/L (millimoles per liter).Your blood glucose may be checked with one or more of the following blood tests:  A fasting blood glucose (FBG) test. You will not be allowed to eat (you will fast) for at least 8 hours before a blood sample is taken. ? A normal range for FBG is 70-100 mg/dl (3.9-5.6 mmol/L).  An A1c (hemoglobin A1c) blood test. This test provides information about blood glucose control over the previous 2?31months.  An oral glucose tolerance test (OGTT). This test measures your blood glucose twice: ? After fasting. This is your baseline level. ? Two hours after you drink a beverage that contains glucose.  You may be diagnosed with prediabetes:  If your FBG is 100?125 mg/dL (5.6-6.9 mmol/L).  If your A1c level is 5.7?6.4%.  If your OGGT result is 140?199 mg/dL (7.8-11 mmol/L).  These blood tests may be repeated to confirm your diagnosis. What happens if blood glucose is too high? The pancreas produces a hormone (insulin) that helps move glucose from the bloodstream into cells. When cells in the body do not respond properly to insulin that the body makes (insulin resistance), excess glucose builds up in the blood instead of going into cells. As a result, high blood glucose (hyperglycemia) can develop, which can cause many complications. This is a symptom of prediabetes. What can happen if blood glucose stays higher than normal for a long time? Having high blood glucose for a long time is dangerous. Too much glucose in your blood can damage your nerves and blood vessels. Long-term damage can lead to complications from diabetes, which may include:  Heart  disease.  Stroke.  Blindness.  Kidney disease.  Depression.  Poor circulation in the feet and legs, which could lead to surgical removal (amputation) in severe cases.  How can prediabetes be prevented from turning into type 2 diabetes?  To help prevent type 2 diabetes, take the following actions:  Be physically active. ? Do moderate-intensity physical activity for at least 30 minutes on at least 5 days of the week, or as much as told by your health care provider. This could be brisk walking, biking, or water aerobics. ? Ask your health care provider what activities are safe for you. A mix of physical activities may be best, such as walking, swimming, cycling, and strength training.  Lose weight as told by your health care provider. ? Losing 5-7% of your body weight can reverse insulin resistance. ? Your health care provider can determine how much weight loss is best for you and can help you lose weight safely.  Follow a healthy meal plan. This includes eating lean proteins, complex carbohydrates, fresh fruits and vegetables, low-fat dairy products, and healthy fats. ? Follow instructions from your health care provider about eating or drinking restrictions. ? Make an appointment to see a diet and nutrition specialist (registered  dietitian) to help you create a healthy eating plan that is right for you.  Do not smoke or use any tobacco products, such as cigarettes, chewing tobacco, and e-cigarettes. If you need help quitting, ask your health care provider.  Take over-the-counter and prescription medicines as told by your health care provider. You may be prescribed medicines that help lower the risk of type 2 diabetes.  This information is not intended to replace advice given to you by your health care provider. Make sure you discuss any questions you have with your health care provider. Document Released: 07/06/2015 Document Revised: 08/20/2015 Document Reviewed: 05/05/2015 Elsevier  Interactive Patient Education  2018 [employer].      IF you received an x-ray today, you will receive an invoice from [facility]. Please contact [facility] at [phone] with questions or concerns regarding your invoice.   IF you received labwork today, you will receive an invoice from [facility]. Please contact [facility] at [phone] with questions or concerns regarding your invoice.   Our billing staff will not be able to assist you with questions regarding bills from these companies.  You will be contacted with the lab results as soon as they are available. The fastest way to get your results is to activate your My Chart account. Instructions are located on the last page of this paperwork. If you have not heard from [place] regarding the results in 2 weeks, please contact this office.       I personally performed the services described in this documentation, which was scribed in my presence. The recorded information has been reviewed and considered for accuracy and completeness, addended by me as needed, and agree with information above.  Signed,   [name], MD [facility].  08/23/17 9:15 PM

## 2017-08-23 ENCOUNTER — Encounter: Payer: Self-pay | Admitting: Family Medicine

## 2017-08-26 DIAGNOSIS — I447 Left bundle-branch block, unspecified: Secondary | ICD-10-CM

## 2017-08-26 HISTORY — DX: Left bundle-branch block, unspecified: I44.7

## 2017-09-07 ENCOUNTER — Encounter: Payer: Self-pay | Admitting: Cardiology

## 2017-09-07 ENCOUNTER — Ambulatory Visit (INDEPENDENT_AMBULATORY_CARE_PROVIDER_SITE_OTHER): Payer: Medicare Other | Admitting: Cardiology

## 2017-09-07 VITALS — BP 122/72 | HR 77 | Ht 69.0 in | Wt 194.8 lb

## 2017-09-07 DIAGNOSIS — E785 Hyperlipidemia, unspecified: Secondary | ICD-10-CM

## 2017-09-07 DIAGNOSIS — R0602 Shortness of breath: Secondary | ICD-10-CM

## 2017-09-07 DIAGNOSIS — I447 Left bundle-branch block, unspecified: Secondary | ICD-10-CM | POA: Diagnosis not present

## 2017-09-07 DIAGNOSIS — I428 Other cardiomyopathies: Secondary | ICD-10-CM | POA: Diagnosis not present

## 2017-09-07 NOTE — Patient Instructions (Addendum)
  MEDICATION INSTRUCTIONS  NO CHANGES  RECOMMEND FOR PRIMARY TO START CRESTOR 10 MG FOR YOUR CHOLESTEROL.    [facility] has requested that you have an echocardiogram. Echocardiography is a painless test that uses sound waves to create images of your heart. It provides your doctor with information about the size and shape of your heart and how well your heart's chambers and valves are working. This procedure takes approximately one hour. There are no restrictions for this procedure.    Your physician wants you to follow-up in 22 month with DR HARDING. You will receive a reminder letter in the mail two months in advance. If you don't receive a letter, please call our office to schedule the follow-up appointment.

## 2017-09-07 NOTE — Progress Notes (Signed)
PCP: [name], MD  Clinic Note: Chief Complaint  Patient presents with  . Follow-up    No real complaints  . Cardiomyopathy  . Hyperlipidemia    HPI: [name] [name] is a 72 y.o. male with a PMH below who presents today for 10 month f/u for Mod NICM. I last saw him in June 2017 -his only major complaint was difficulty sleeping at that time.  Was stable from cardiac standpoint.Philis Pique was last seen in July 2018 by Almyra Deforest, PA -- lipids poorly controlled (was supposed to have f/u labs & statin was recommended.   Recent Hospitalizations: n/a  Studies Personally Reviewed - (if available, images/films reviewed: From Epic Chart or Care Everywhere)  LE Venous dopplers  No DVT  Interval History: [name] [name] returns today overall doing well with no major complaints.  He notes that he really does not do routine exercise, but he does "work hard" doing [employer] housework chores.  For the most part he does fine without any SOB unless he pushes himself.  Regardless, he has not noted any chest pain with rest or exertion.  His walking type exercise is limited by knee pain -- recent L knee meniscal tear. -- since then, he has had LLE swellling > RLE.    Otherwise Cardiac Review of Sx is essentially negative:   No PND, orthopnea or edema (besides knee related swelling) No palpitations, lightheadedness, dizziness, weakness or syncope/near syncope. No TIA/amaurosis fugax symptoms.  No claudication.  ROS: A comprehensive was performed. Review of Systems  Constitutional: Negative for malaise/fatigue.  HENT: Negative for congestion and nosebleeds.   Respiratory: Positive for shortness of breath (no change from baseline DOE - does feel "out of shape").   Gastrointestinal: Negative for blood in stool, constipation, diarrhea and melena.  Genitourinary: Negative for dysuria and hematuria.  Musculoskeletal: Positive for joint pain (L Knee ).  Neurological: Negative for dizziness and  focal weakness.  Psychiatric/Behavioral: Negative for depression. The patient is not nervous/anxious.   All other systems reviewed and are negative.   I have reviewed and (if needed) personally updated the patient's problem list, medications, allergies, past medical and surgical history, social and family history.   Past Medical History:  Diagnosis Date  . Arthritis   . BPH (benign prostatic hypertrophy)   . Coronary atherosclerosis of native coronary vessel    CATH 2004--  MINIMAL NONOBSTRUCTIVE LAD/  NORMAL EF/    CARDIAC CT  03/2008  NO SIGNIFICANT DISEASE  AND  nlef  . Elevated PSA   . Eye problem 03/06/2016   [name] retinal detachment left eye, no sx done  . History of gout    pt 05-20-2013 states stable   . History of melanoma excision    2011-- LEFT UPPER BACK  . Hyperlipemia   . Hypertriglyceridemia   . Melanoma ([place]) 2011   back  . Nonischemic cardiomyopathy ([place])    09-04-2009  --  2D echo-EF 40-45%, Global Hypokinesis  . Personal history of arthritis   . Personal history of other diseases of circulatory system     Past Surgical History:  Procedure Laterality Date  . APPENDECTOMY  AS CHILD  . CARDIAC CATHETERIZATION  05/23/2002  &  05-23-1998   DR AL LITTLE   minimal irregularities in the LAD/  EF 50%  . CARDIOVASCULAR STRESS TEST  10-05-1998   MILD GLOBAL HYPOKINESIS AND ISCHEMIAIN ANTEROSEPTAL  AT [facility]/ EF 42%  . EXCISION MELANOMA LEFT UPPER BACK  10-14-2009  . eye lid surgery Bilateral 2018  . KNEE ARTHROSCOPY WITH SUBCHONDROPLASTY Left 04/07/2017   Procedure: LEFT KNEE ARTHROSCOPY WITH PARTIAL MEDIAL MENISCECTOMY AND MEDIAL TIBIAL SUBCHONDROPLASTY;  Surgeon: Mcarthur Rossetti, MD;  Location: [facility];  Service: Orthopedics;  Laterality: Left;  . knee injections Bilateral   . LUMBAR DISC SURGERY  09-12-2000   left  L5 -- S1  . PROSTATE BIOPSY N/A 05/22/2013   Procedure: BIOPSY TRANSRECTAL ULTRASONIC PROSTATE (TUBP);  Surgeon: [name], MD;   Location[facility];  Service: Urology;  Laterality: N/A;  . ROTATOR CUFF REPAIR Right 2016  . TRANSRECTAL ULTRASOUND PROSTATE BX  05-23-2005  &  04-19-2001  . TRANSTHORACIC ECHOCARDIOGRAM  09-04-2009   MILD LVH/  MILD GLOBAL HYPOKINESIS/ LVEF 40-45%/  MILD AV SCLEROSIS WITHOUT STENOSIS/  MILD PVR    Current Meds  Medication Sig  . aspirin EC 81 MG tablet Take 81 mg by mouth daily.  . fish oil-omega-3 fatty acids 1000 MG capsule Take 1 g by mouth daily.  Marland Kitchen gabapentin (NEURONTIN) 300 MG capsule Take 1-2 capsules (300-600 mg total) by mouth 2 (two) times daily as needed.  . Garlic TABS Take 1 tablet by mouth daily.  . Multiple Vitamins-Minerals (MULTIVITAMIN PO) Take 1 tablet by mouth daily.  . naproxen sodium (ANAPROX) 220 MG tablet Take 440 mg by mouth 2 (two) times daily as needed (pain).  . ondansetron (ZOFRAN ODT) 4 MG disintegrating tablet Take 1 tablet (4 mg total) by mouth every 8 (eight) hours as needed for nausea or vomiting.  . polyvinyl alcohol (LIQUIFILM TEARS) 1.4 % ophthalmic solution Place 1 drop into both eyes daily as needed for dry eyes.  Marland Kitchen scopolamine (TRANSDERM-SCOP, 1.5 MG,) 1 MG/3DAYS Place 1 patch (1.5 mg total) onto the skin every 3 (three) days.  . sildenafil (VIAGRA) 100 MG tablet 1/2 to 1 tablet up to each day as needed. (Patient taking differently: Take 50-100 mg by mouth daily as needed for erectile dysfunction. )  . Turmeric Curcumin 500 MG CAPS Take 1 tablet by mouth daily.  Marland Kitchen UNABLE TO FIND Equate cold medicine take 2 caps in am and 2 in pm prn    Allergies  Allergen Reactions  . Nitroglycerin Anaphylaxis    "Cardiologist suggested he shouldn't take this med because it could kill him with his heart condition. Lowers BP"  . Ace Inhibitors Swelling  . Lovastatin Nausea Only  . Solarcaine [Benzocaine] Dermatitis    Social History   Tobacco Use  . Smoking status: Former Smoker    Packs/day: 2.00    Years: 20.00    Pack years: 40.00     Types: Cigarettes    Last attempt to quit: 03/28/1980    Years since quitting: 37.4  . Smokeless tobacco: Never Used  Substance Use Topics  . Alcohol use: No    Alcohol/week: 0.0 oz  . Drug use: No   Social History   Social History Narrative   He walks on a relatively regular basis about 15 minutes at a time mostly to the discomfort. He previously had walked up a 30-40 minutes a time without problems. Education: [employer].    family history includes Arthritis in his mother; COPD in his father; Cancer in his father and sister; Cardiomyopathy in his unknown relative.  Wt Readings from Last 3 Encounters:  09/07/17 194 lb 12.8 oz (88.4 kg)  08/22/17 195 lb 3.2 oz (88.5 kg)  08/08/17 195 lb 9.6 oz (88.7 kg)  PHYSICAL EXAM BP 122/72   Pulse 77   Ht 5\' 9"  (1.753 m)   Wt 194 lb 12.8 oz (88.4 kg)   BMI 28.77 kg/m  Physical Exam  Constitutional: He is oriented to person, place, and time. He appears well-developed and well-nourished. No distress.  Healthy-appearing, Well-groomed  HENT:  Head: Normocephalic.  Neck: Normal range of motion. Neck supple. No hepatojugular reflux and no JVD present. Carotid bruit is not present.  Cardiovascular: Normal rate, regular rhythm, S1 normal, intact distal pulses and normal pulses.  No extrasystoles are present. PMI is not displaced. Exam reveals friction rub. Exam reveals no gallop.  No murmur heard. Partially split S2  Pulmonary/Chest: Effort normal and breath sounds normal. No respiratory distress. He has no wheezes. He has no rales.  Abdominal: Soft. Bowel sounds are normal. He exhibits no distension. There is no tenderness. There is no rebound.  Musculoskeletal: Normal range of motion. He exhibits no edema.  Neurological: He is alert and oriented to person, place, and time.  Skin: Skin is warm and dry.  Psychiatric: He has a normal mood and affect. His behavior is normal. Judgment and thought content normal.  Vitals reviewed.     Adult  ECG Report  Rate: 77 ;  Rhythm: normal sinus rhythm and LBBB (new).  Otherwise normal axis, intervals & durations.;   Narrative Interpretation: New LBBB in absence of CP or dyspnea - likely progression of conduction disease  Other studies Reviewed: Additional studies/ records that were reviewed today include:  Recent Labs:   Lab Results  Component Value Date   CHOL 200 (H) 08/08/2017   HDL 34 (L) 08/08/2017   LDLCALC 126 (H) 08/08/2017   TRIG 200 (H) 08/08/2017   CHOLHDL 5.9 (H) 08/08/2017   Lab Results  Component Value Date   CREATININE 1.04 08/08/2017   BUN 24 08/08/2017   NA 142 08/08/2017   K 4.2 08/08/2017   CL 105 08/08/2017   CO2 25 08/08/2017    ASSESSMENT / PLAN: Problem List Items Addressed This Visit    Nonischemic cardiomyopathy ([place]) (Chronic)    Mildly reduced LVEF by Echo several years ago - has not had a follow-up evaluation.   No active CHF symptoms.   No longer on any Cardiac Medications - on Beta Blocker or ARB/ACE-I.   Euvolemic on exam & by Sx - no diuretic requirement.  -- with LBBB on EKG, will recheck 2D Echo - pending results, may need to reconsider cardiac meds.      Relevant Orders   EKG 12-Lead (Completed)   ECHOCARDIOGRAM COMPLETE   LBBB (left bundle branch block) - Primary    This is a new finding --> will check 2 D Echocardiogram to reassess EF.   With no active SSx of CHF or Angina, will hold off on Ischemic Evaluation until we see result of Echo.      Relevant Orders   EKG 12-Lead (Completed)   ECHOCARDIOGRAM COMPLETE   Hyperlipidemia with target LDL less than 100 (Chronic)    Lipids well out of control -- as these labs have been followed by his PCP, will defer to PCP.  However, would strongly recommend initiating Statin -- consider Crestor 10 mg.       Exertional shortness of breath (Chronic)    Relatively stable - no worse or better. Rechecking Echo.      Relevant Orders   EKG 12-Lead (Completed)   ECHOCARDIOGRAM COMPLETE        Current medicines are  reviewed at length with the patient today.  (+/- concerns) n/a The following changes have been made:  Recommend starting Crestor  Patient Instructions    MEDICATION INSTRUCTIONS  NO CHANGES  RECOMMEND FOR PRIMARY TO START CRESTOR 10 MG FOR YOUR CHOLESTEROL.    SCHEDULE AT [place] has requested that you have an echocardiogram. Echocardiography is a painless test that uses sound waves to create images of your heart. It provides your doctor with information about the size and shape of your heart and how well your heart's chambers and valves are working. This procedure takes approximately one hour. There are no restrictions for this procedure.    Your physician wants you to follow-up in 22 month with DR HARDING. You will receive a reminder letter in the mail two months in advance. If you don't receive a letter, please call our office to schedule the follow-up appointment.  -- if Echo is worse, will call for earlier f/u.    Studies Ordered:   Orders Placed This Encounter  Procedures  . EKG 12-Lead  . ECHOCARDIOGRAM COMPLETE      Glenetta Hew, M.D., M.S. Interventional Cardiologist   Pager # [phone] Phone # [phone] [address] [place], [place] [address]   Thank you for choosing [facility]!!

## 2017-09-09 ENCOUNTER — Encounter: Payer: Self-pay | Admitting: Cardiology

## 2017-09-09 NOTE — Assessment & Plan Note (Signed)
Lipids well out of control -- as these labs have been followed by his PCP, will defer to PCP.  However, would strongly recommend initiating Statin -- consider Crestor 10 mg.

## 2017-09-09 NOTE — Assessment & Plan Note (Signed)
Mildly reduced LVEF by Echo several years ago - has not had a follow-up evaluation.   No active CHF symptoms.   No longer on any Cardiac Medications - on Beta Blocker or ARB/ACE-I.   Euvolemic on exam & by Sx - no diuretic requirement.  -- with LBBB on EKG, will recheck 2D Echo - pending results, may need to reconsider cardiac meds.

## 2017-09-09 NOTE — Assessment & Plan Note (Signed)
Relatively stable - no worse or better. Rechecking Echo.

## 2017-09-09 NOTE — Assessment & Plan Note (Signed)
This is a new finding --> will check 2 D Echocardiogram to reassess EF.   With no active SSx of CHF or Angina, will hold off on Ischemic Evaluation until we see result of Echo.

## 2017-09-11 ENCOUNTER — Encounter (INDEPENDENT_AMBULATORY_CARE_PROVIDER_SITE_OTHER): Payer: Self-pay | Admitting: Physician Assistant

## 2017-09-11 ENCOUNTER — Ambulatory Visit (INDEPENDENT_AMBULATORY_CARE_PROVIDER_SITE_OTHER): Payer: Medicare Other

## 2017-09-11 ENCOUNTER — Ambulatory Visit (INDEPENDENT_AMBULATORY_CARE_PROVIDER_SITE_OTHER): Payer: Medicare Other | Admitting: Physician Assistant

## 2017-09-11 DIAGNOSIS — M1712 Unilateral primary osteoarthritis, left knee: Secondary | ICD-10-CM

## 2017-09-11 NOTE — Progress Notes (Signed)
Office Visit Note   Patient: [name] [name]           Date of Birth: 1945-10-03           MRN: [id number] Visit Date: 09/11/2017              Requested by: [name], MD [address] [place], [place] [address] PCP: [name], MD   Assessment & Plan: Visit Diagnoses:  1. Primary osteoarthritis of left knee     Plan: This point time patient would like to just give at the knees more time.  He states he does not have enough pain at this point time to consider any other surgery.  I did not recommend surgery at this point time but did recommend possible supplemental injection gave him a handout on supplemental injections.  We will see him back in 3 months check his progress lack of.  We will continue to work on quad strengthening uses Aleve and to [place].  Follow-Up Instructions: Return in about 3 months (around 12/12/2017).   Orders:  Orders Placed This Encounter  Procedures  . XR KNEE 3 VIEW LEFT   No orders of the defined types were placed in this encounter.     Procedures: No procedures performed   Clinical Data: No additional findings.   Subjective: Chief Complaint  Patient presents with  . Left Knee - Pain, Follow-up    HPI Mr. Recinos returns today 5 months status post left knee arthroscopy with partial medial meniscectomy and medial tibial sub-chondroplasty.  He states the cortisone injection was given on 08/10/2017 helped for about 3 days.  Now he has good days bad days.  Depends on his activities.  He states if he rests he really does not have much pain in the knee.  States pain is not unbearable.  He is having no mechanical symptoms. Review of Systems Please see HPI otherwise negative  Objective: Vital Signs: There were no vitals taken for this visit.  Physical Exam  Constitutional: He appears well-developed and well-nourished. No distress.  Pulmonary/Chest: Effort normal.  Skin: He is not diaphoretic.  Psychiatric: He has a normal mood and  affect.    Ortho Exam Left knee has overall good range of motion of the knee.  No instability valgus varus stressing no effusion abnormal warmth. Specialty Comments:  No specialty comments available.  Imaging: Xr Knee 3 View Left  Result Date: 09/11/2017 Left knee 2 views.  No acute fracture.  Area of sub-chondroplasty with increased density.  No other acute fractures bony abnormalities.  Moderate medial joint line narrowing.  Sunrise view both knees shows some minimal patellofemoral changes.    PMFS History: Patient Active Problem List   Diagnosis Date Noted  . LBBB (left bundle branch block) 09/07/2017  . Other meniscus derangements, posterior horn of medial meniscus, left knee, insufficency fracture medial tibial plateau 04/07/2017  . Closed nondisplaced fracture of lateral condyle of left tibia with routine healing 04/07/2017  . Status post arthroscopy of left knee 04/07/2017  . Insomnia 09/14/2015  . Hyperlipidemia with target LDL less than 100 09/11/2014  . Rotator cuff tendinitis 04/28/2014  . Obesity (BMI 30-39.9) 12/29/2013  . 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  . Exertional shortness of breath 06/26/2012  . Polyp of colon, adenomatous 04/27/2012  . Elevated PSA 10/20/2011  . Hypertriglyceridemia 07/08/2011  . Nonischemic cardiomyopathy ([place]) 09/04/2009   Past Medical History:  Diagnosis Date  . Arthritis   . BPH (benign prostatic hypertrophy)   .  Coronary atherosclerosis of native coronary vessel    CATH 2004--  MINIMAL NONOBSTRUCTIVE LAD/  NORMAL EF/    CARDIAC CT  03/2008  NO SIGNIFICANT DISEASE  AND  nlef  . Elevated PSA   . Eye problem 03/06/2016   [name] retinal detachment left eye, no sx done  . History of gout    pt 05-20-2013 states stable   . History of melanoma excision    2011-- LEFT UPPER BACK  . Hyperlipemia   . Hypertriglyceridemia   . Melanoma ([place]) 2011   back  . Nonischemic cardiomyopathy ([place])    09-04-2009  --  2D echo-EF  40-45%, Global Hypokinesis  . Personal history of arthritis   . Personal history of other diseases of circulatory system     Family History  Problem Relation Age of Onset  . Arthritis Mother   . COPD Father   . Cancer Father        lung cancer  . Cancer Sister   . Cardiomyopathy Unknown        idiopathic. trivial disease 2004 cath, 2010 cardiac CT - no sig. dz, nl LV fxn. cards: [place]  . Colon cancer Neg Hx     Past Surgical History:  Procedure Laterality Date  . APPENDECTOMY  AS CHILD  . CARDIAC CATHETERIZATION  05/23/2002  &  05-23-1998   DR AL LITTLE   minimal irregularities in the LAD/  EF 50%  . CARDIOVASCULAR STRESS TEST  10-05-1998   MILD GLOBAL HYPOKINESIS AND ISCHEMIAIN ANTEROSEPTAL  AT [facility]/ EF 42%  . EXCISION MELANOMA LEFT UPPER BACK  10-14-2009  . eye lid surgery Bilateral 2018  . KNEE ARTHROSCOPY WITH SUBCHONDROPLASTY Left 04/07/2017   Procedure: LEFT KNEE ARTHROSCOPY WITH PARTIAL MEDIAL MENISCECTOMY AND MEDIAL TIBIAL SUBCHONDROPLASTY;  Surgeon: Mcarthur Rossetti, MD;  Location: [facility];  Service: Orthopedics;  Laterality: Left;  . knee injections Bilateral   . LUMBAR DISC SURGERY  09-12-2000   left  L5 -- S1  . PROSTATE BIOPSY N/A 05/22/2013   Procedure: BIOPSY TRANSRECTAL ULTRASONIC PROSTATE (TUBP);  Surgeon: [name], MD;  Location: [facility];  Service: Urology;  Laterality: N/A;  . ROTATOR CUFF REPAIR Right 2016  . TRANSRECTAL ULTRASOUND PROSTATE BX  05-23-2005  &  04-19-2001  . TRANSTHORACIC ECHOCARDIOGRAM  09-04-2009   MILD LVH/  MILD GLOBAL HYPOKINESIS/ LVEF 40-45%/  MILD AV SCLEROSIS WITHOUT STENOSIS/  MILD PVR   Social History   Occupational History  . 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  Tobacco Use  . Smoking status: Former Smoker    Packs/day: 2.00    Years: 20.00    Pack years: 40.00    Types: Cigarettes    Last attempt to quit: 03/28/1980    Years  since quitting: 37.4  . Smokeless tobacco: Never Used  Substance and Sexual Activity  . Alcohol use: No    Alcohol/week: 0.0 oz  . Drug use: No  . Sexual activity: Not on file

## 2017-09-18 ENCOUNTER — Ambulatory Visit: Payer: Medicare Other | Admitting: Cardiology

## 2017-09-25 HISTORY — PX: TRANSTHORACIC ECHOCARDIOGRAM: SHX275

## 2017-10-03 ENCOUNTER — Encounter: Payer: Self-pay | Admitting: Family Medicine

## 2017-10-03 ENCOUNTER — Ambulatory Visit (INDEPENDENT_AMBULATORY_CARE_PROVIDER_SITE_OTHER): Payer: Medicare Other | Admitting: Family Medicine

## 2017-10-03 ENCOUNTER — Other Ambulatory Visit: Payer: Self-pay

## 2017-10-03 VITALS — BP 126/63 | HR 71 | Temp 97.6°F | Ht 69.0 in | Wt 195.0 lb

## 2017-10-03 DIAGNOSIS — E785 Hyperlipidemia, unspecified: Secondary | ICD-10-CM | POA: Diagnosis not present

## 2017-10-03 DIAGNOSIS — R208 Other disturbances of skin sensation: Secondary | ICD-10-CM | POA: Diagnosis not present

## 2017-10-03 DIAGNOSIS — J069 Acute upper respiratory infection, unspecified: Secondary | ICD-10-CM

## 2017-10-03 MED ORDER — ROSUVASTATIN CALCIUM 10 MG PO TABS: 10.0000 mg | ORAL_TABLET | Freq: Every day | ORAL | 0 refills | Status: DC

## 2017-10-03 NOTE — Patient Instructions (Addendum)
Hand and arm symptoms seem to have improved. Those still could be related to your neck.  If you drive long distances, change positions frequently. Tylenol if needed, but continuing gabapentin can help as well.   Cough and congestion should continue to improve. Return to the clinic or go to the nearest emergency room if any of your symptoms worsen or new symptoms occur.   Start crestor once per day for cholesterol and recheck in 6 weeks. Let me know if there are questions prior to that time.   IF you received an x-ray today, you will receive an invoice from [facility]. Please contact [facility] at [phone] with questions or concerns regarding your invoice.   IF you received labwork today, you will receive an invoice from [facility]. Please contact [facility] at [phone] with questions or concerns regarding your invoice.   Our billing staff will not be able to assist you with questions regarding bills from these companies.  You will be contacted with the lab results as soon as they are available. The fastest way to get your results is to activate your My Chart account. Instructions are located on the last page of this paperwork. If you have not heard from [place] regarding the results in 2 weeks, please contact this office.

## 2017-10-03 NOTE — Progress Notes (Signed)
Subjective:  By signing my name below, I, Essence Howell, attest that this documentation has been prepared under the direction and in the presence of [name], MD Electronically Signed: Ladene Artist, ED Scribe 10/03/2017 at 9:18 AM.   Patient ID: [name] [name], male    DOB: May 17, 1945, 72 y.o.   MRN: [id number]  Chief Complaint  Patient presents with  . both arms falling asleep    have noticed it in the last week. ("arms seems to be doing ok")  . Cold Exposure    wants to check if the lungs are still doing okay.    HPI [name] [name] is a 72 y.o. male who presents to Primary Care at [facility] for f/u of arm dysesthesia. Last eval 5/28. Episodic numbness/tingling in hands in the past. Noticed more with driving or laying in bed. Denied neck pain but had some degenerative changes in c-spine in 2015. Recommended Tylenol and continued gabapentin for neuropathy. - Pt states that he has not noticed symptoms lately since returning from [place]. States he has not done long-distance driving lately to aggravate the symptoms. He has not needed Tylenol, still taking gabapentin prn. Denies weakness.  Cold-like Symptoms Pt reports nasal congestion, chest congestion and productive cough since 6/22. States "I was exposed to cold while on vacation in [place]". No meds tried PTA. Denies fever, sob.  H/o Hyperlipidemia Followed by cardiology Dr. Ellyn Hack, last visit 6/13. H/o CAD, minimal and non-obstructive by prior testing. H/o hyperlipidemia. Recommended he start Crestor 10 mg. Lab Results  Component Value Date   CHOL 200 (H) 08/08/2017   HDL 34 (L) 08/08/2017   LDLCALC 126 (H) 08/08/2017   TRIG 200 (H) 08/08/2017   CHOLHDL 5.9 (H) 08/08/2017   Lab Results  Component Value Date   ALT 14 08/08/2017   AST 16 08/08/2017   ALKPHOS 68 08/08/2017   BILITOT 0.4 08/08/2017    Patient Active Problem List   Diagnosis Date Noted  . LBBB (left bundle branch block) 09/07/2017  . Other meniscus  derangements, posterior horn of medial meniscus, left knee, insufficency fracture medial tibial plateau 04/07/2017  . Closed nondisplaced fracture of lateral condyle of left tibia with routine healing 04/07/2017  . Status post arthroscopy of left knee 04/07/2017  . Insomnia 09/14/2015  . Hyperlipidemia with target LDL less than 100 09/11/2014  . Rotator cuff tendinitis 04/28/2014  . Obesity (BMI 30-39.9) 12/29/2013  . 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  . Exertional shortness of breath 06/26/2012  . Polyp of colon, adenomatous 04/27/2012  . Elevated PSA 10/20/2011  . Hypertriglyceridemia 07/08/2011  . Nonischemic cardiomyopathy ([place]) 09/04/2009   Past Medical History:  Diagnosis Date  . Arthritis   . BPH (benign prostatic hypertrophy)   . Coronary atherosclerosis of native coronary vessel    CATH 2004--  MINIMAL NONOBSTRUCTIVE LAD/  NORMAL EF/    CARDIAC CT  03/2008  NO SIGNIFICANT DISEASE  AND  nlef  . Elevated PSA   . Eye problem 03/06/2016   [name] retinal detachment left eye, no sx done  . History of gout    pt 05-20-2013 states stable   . History of melanoma excision    2011-- LEFT UPPER BACK  . Hyperlipemia   . Hypertriglyceridemia   . Melanoma ([place]) 2011   back  . Nonischemic cardiomyopathy ([place])    09-04-2009  --  2D echo-EF 40-45%, Global Hypokinesis  . Personal history of arthritis   . Personal history of other diseases of  circulatory system    Past Surgical History:  Procedure Laterality Date  . APPENDECTOMY  AS CHILD  . CARDIAC CATHETERIZATION  05/23/2002  &  05-23-1998   DR AL LITTLE   minimal irregularities in the LAD/  EF 50%  . CARDIOVASCULAR STRESS TEST  10-05-1998   MILD GLOBAL HYPOKINESIS AND ISCHEMIAIN ANTEROSEPTAL  AT [facility]/ EF 42%  . EXCISION MELANOMA LEFT UPPER BACK  10-14-2009  . eye lid surgery Bilateral 2018  . KNEE ARTHROSCOPY WITH SUBCHONDROPLASTY Left 04/07/2017   Procedure: LEFT KNEE ARTHROSCOPY WITH PARTIAL MEDIAL MENISCECTOMY AND  MEDIAL TIBIAL SUBCHONDROPLASTY;  Surgeon: Mcarthur Rossetti, MD;  Location: [facility];  Service: Orthopedics;  Laterality: Left;  . knee injections Bilateral   . LUMBAR DISC SURGERY  09-12-2000   left  L5 -- S1  . PROSTATE BIOPSY N/A 05/22/2013   Procedure: BIOPSY TRANSRECTAL ULTRASONIC PROSTATE (TUBP);  Surgeon: [name], MD;  Location: [facility];  Service: Urology;  Laterality: N/A;  . ROTATOR CUFF REPAIR Right 2016  . TRANSRECTAL ULTRASOUND PROSTATE BX  05-23-2005  &  04-19-2001  . TRANSTHORACIC ECHOCARDIOGRAM  09-04-2009   MILD LVH/  MILD GLOBAL HYPOKINESIS/ LVEF 40-45%/  MILD AV SCLEROSIS WITHOUT STENOSIS/  MILD PVR   Allergies  Allergen Reactions  . Nitroglycerin Anaphylaxis    "Cardiologist suggested he shouldn't take this med because it could kill him with his heart condition. Lowers BP"  . Ace Inhibitors Swelling  . Lovastatin Nausea Only  . Solarcaine [Benzocaine] Dermatitis   Prior to Admission medications   Medication Sig Start Date End Date Taking? Authorizing Provider  aspirin EC 81 MG tablet Take 81 mg by mouth daily.   Yes [provider]  fish oil-omega-3 fatty acids 1000 MG capsule Take 1 g by mouth daily.   Yes [provider]  gabapentin (NEURONTIN) 300 MG capsule Take 1-2 capsules (300-600 mg total) by mouth 2 (two) times daily as needed. 04/06/17  Yes [name], MD  Garlic TABS Take 1 tablet by mouth daily.   Yes [provider]  Multiple Vitamins-Minerals (MULTIVITAMIN PO) Take 1 tablet by mouth daily.   Yes [provider]  naproxen sodium (ANAPROX) 220 MG tablet Take 440 mg by mouth 2 (two) times daily as needed (pain).   Yes [provider]  ondansetron (ZOFRAN ODT) 4 MG disintegrating tablet Take 1 tablet (4 mg total) by mouth every 8 (eight) hours as needed for nausea or vomiting. 08/22/17  Yes [name], MD  polyvinyl alcohol (LIQUIFILM TEARS) 1.4 % ophthalmic solution Place 1  drop into both eyes daily as needed for dry eyes.   Yes [provider]  sildenafil (VIAGRA) 100 MG tablet 1/2 to 1 tablet up to each day as needed. Patient taking differently: Take 50-100 mg by mouth daily as needed for erectile dysfunction.  07/08/11  Yes [name], MD  Turmeric Curcumin 500 MG CAPS Take 1 tablet by mouth daily.   Yes [provider]  UNABLE TO FIND Equate cold medicine take 2 caps in am and 2 in pm prn   Yes [provider]   Social History   Socioeconomic History  . Marital status: Widowed    Spouse name: Not on file  . Number of children: Not on file  . Years of education: Not on file  . Highest education level: Not on file  Occupational History  . 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  Social Needs  . Financial resource strain: Not on file  . Food insecurity:    Worry: Not on file    Inability: Not on file  . Transportation needs:    Medical: Not on file    Non-medical: Not on file  Tobacco Use  . Smoking status: Former Smoker    Packs/day: 2.00    Years: 20.00    Pack years: 40.00    Types: Cigarettes    Last attempt to quit: 03/28/1980    Years since quitting: 37.5  . Smokeless tobacco: Never Used  Substance and Sexual Activity  . Alcohol use: No    Alcohol/week: 0.0 oz  . Drug use: No  . Sexual activity: Not on file  Lifestyle  . Physical activity:    Days per week: Not on file    Minutes per session: Not on file  . Stress: Not on file  Relationships  . Social connections:    Talks on phone: Not on file    Gets together: Not on file    Attends religious service: Not on file    Active member of club or organization: Not on file    Attends meetings of clubs or organizations: Not on file    Relationship status: Not on file  . Intimate partner violence:    Fear of current or ex partner: Not on file    Emotionally abused: Not on file     Physically abused: Not on file    Forced sexual activity: Not on file  Other Topics Concern  . Not on file  Social History Narrative   He walks on a relatively regular basis about 15 minutes at a time mostly to the discomfort. He previously had walked up a 30-40 minutes a time without problems. Education: [employer].   Review of Systems  Constitutional: Negative for fever.  HENT: Positive for congestion.   Respiratory: Positive for cough. Negative for shortness of breath.   Neurological: Negative for weakness.      Objective:   Physical Exam  Constitutional: He is oriented to person, place, and time. He appears well-developed and well-nourished.  HENT:  Head: Normocephalic and atraumatic.  Right Ear: Tympanic membrane, external ear and ear canal normal.  Left Ear: Tympanic membrane, external ear and ear canal normal.  Nose: No rhinorrhea. Right sinus exhibits no maxillary sinus tenderness and no frontal sinus tenderness. Left sinus exhibits no maxillary sinus tenderness and no frontal sinus tenderness.  Mouth/Throat: Oropharynx is clear and moist and mucous membranes are normal. No oropharyngeal exudate or posterior oropharyngeal erythema.  Eyes: Pupils are equal, round, and reactive to light. Conjunctivae are normal.  Neck: Neck supple.  Slight diminished cervical ROM but does not reproduce hand/arm symptoms.  Cardiovascular: Normal rate, regular rhythm, normal heart sounds and intact distal pulses.  No murmur heard. Pulmonary/Chest: Effort normal and breath sounds normal. He has no wheezes. He has no rhonchi. He has no rales.  Abdominal: Soft. There is no tenderness.  Lymphadenopathy:    He has no cervical adenopathy.  Neurological: He is alert and oriented to person, place, and time.  Normal grip and upper arm strength.  Skin: Skin is warm and dry. No rash noted.  Psychiatric: He has a normal mood and affect. His behavior is normal.  Vitals reviewed.   Vitals:   10/03/17  0842  BP: 126/63  Pulse: 71  Temp: 97.6 F (36.4 C)  TempSrc: Oral  SpO2: 94%  Weight: 195 lb (88.5 kg)  Height: 5\' 9"  (1.753 m)      Assessment & Plan:   [name] [name] is a 72 y.o. male Dysesthesia  -Possible cervical source, improved symptoms recently.  Has Tylenol if needed, as well as gabapentin for his peripheral neuropathy.  Frequent position changes discussed with traveling to lessen spasm.  RTC precautions if more persistent or frequent symptoms.   Acute upper respiratory infection  -Improved, likely viral infection that is resolving.  RTC precautions if not continuing to improve or worsens  Hyperlipidemia, unspecified hyperlipidemia type - Plan: rosuvastatin (CRESTOR) 10 MG tablet  -Elevated per prior labs.  Notes reviewed from cardiology, will start Crestor 10 mg daily with recheck in 6 weeks.  Potential side effects and risks were discussed.  Understanding expressed.  Meds ordered this encounter  Medications  . rosuvastatin (CRESTOR) 10 MG tablet    Sig: Take 1 tablet (10 mg total) by mouth daily.    Dispense:  90 tablet    Refill:  0   Patient Instructions   Hand and arm symptoms seem to have improved. Those still could be related to your neck.  If you drive long distances, change positions frequently. Tylenol if needed, but continuing gabapentin can help as well.   Cough and congestion should continue to improve. Return to the clinic or go to the nearest emergency room if any of your symptoms worsen or new symptoms occur.   Start crestor once per day for cholesterol and recheck in 6 weeks. Let me know if there are questions prior to that time.   IF you received an x-ray today, you will receive an invoice from [facility]. Please contact [facility] at [phone] with questions or concerns regarding your invoice.   IF you received labwork today, you will receive an invoice from [facility]. Please contact [facility] at [phone] with questions or  concerns regarding your invoice.   Our billing staff will not be able to assist you with questions regarding bills from these companies.  You will be contacted with the lab results as soon as they are available. The fastest way to get your results is to activate your My Chart account. Instructions are located on the last page of this paperwork. If you have not heard from [place] regarding the results in 2 weeks, please contact this office.       I personally performed the services described in this documentation, which was scribed in my presence. The recorded information has been reviewed and considered for accuracy and completeness, addended by me as needed, and agree with information above.  Signed,   [name], MD [facility] at [place].  10/03/17 9:33 AM

## 2017-10-09 ENCOUNTER — Encounter: Payer: Medicare Other | Admitting: Family Medicine

## 2017-10-16 ENCOUNTER — Other Ambulatory Visit: Payer: Self-pay

## 2017-10-16 ENCOUNTER — Ambulatory Visit (HOSPITAL_COMMUNITY): Payer: Medicare Other | Attending: Internal Medicine

## 2017-10-16 DIAGNOSIS — E669 Obesity, unspecified: Secondary | ICD-10-CM | POA: Insufficient documentation

## 2017-10-16 DIAGNOSIS — I429 Cardiomyopathy, unspecified: Secondary | ICD-10-CM | POA: Diagnosis not present

## 2017-10-16 DIAGNOSIS — I447 Left bundle-branch block, unspecified: Secondary | ICD-10-CM

## 2017-10-16 DIAGNOSIS — R0600 Dyspnea, unspecified: Secondary | ICD-10-CM | POA: Insufficient documentation

## 2017-10-16 DIAGNOSIS — E785 Hyperlipidemia, unspecified: Secondary | ICD-10-CM | POA: Insufficient documentation

## 2017-10-16 DIAGNOSIS — Z6828 Body mass index (BMI) 28.0-28.9, adult: Secondary | ICD-10-CM | POA: Insufficient documentation

## 2017-10-16 DIAGNOSIS — I428 Other cardiomyopathies: Secondary | ICD-10-CM

## 2017-10-16 DIAGNOSIS — R0602 Shortness of breath: Secondary | ICD-10-CM

## 2017-11-01 ENCOUNTER — Telehealth (INDEPENDENT_AMBULATORY_CARE_PROVIDER_SITE_OTHER): Payer: Self-pay | Admitting: Orthopaedic Surgery

## 2017-11-01 NOTE — Telephone Encounter (Signed)
Patient called would like to proceed with injection. I'm unaware of what type of injection,Patient stated [name] [name] would know what he was talking about.

## 2017-11-01 NOTE — Telephone Encounter (Signed)
Gel injection please

## 2017-11-07 ENCOUNTER — Telehealth (INDEPENDENT_AMBULATORY_CARE_PROVIDER_SITE_OTHER): Payer: Self-pay

## 2017-11-07 NOTE — Telephone Encounter (Signed)
Noted  

## 2017-11-07 NOTE — Telephone Encounter (Signed)
Submitted for Monovisc, left knee.

## 2017-11-09 ENCOUNTER — Telehealth (INDEPENDENT_AMBULATORY_CARE_PROVIDER_SITE_OTHER): Payer: Self-pay | Admitting: Orthopaedic Surgery

## 2017-11-09 NOTE — Telephone Encounter (Signed)
[name] [name] from my [employer] program called left voicemail message advised she called second insurance but they could not locate patient. [name] [name] asked for a call back to verify information or it can be sent through the portal. The number to contact [name] [name] is [phone] Ext: [phone]

## 2017-11-14 ENCOUNTER — Encounter: Payer: Self-pay | Admitting: Family Medicine

## 2017-11-14 ENCOUNTER — Ambulatory Visit (INDEPENDENT_AMBULATORY_CARE_PROVIDER_SITE_OTHER): Payer: Medicare Other | Admitting: Family Medicine

## 2017-11-14 ENCOUNTER — Ambulatory Visit (INDEPENDENT_AMBULATORY_CARE_PROVIDER_SITE_OTHER): Payer: Medicare Other

## 2017-11-14 ENCOUNTER — Other Ambulatory Visit: Payer: Self-pay

## 2017-11-14 VITALS — BP 111/64 | HR 78 | Temp 97.5°F | Ht 68.5 in | Wt 196.4 lb

## 2017-11-14 DIAGNOSIS — M5030 Other cervical disc degeneration, unspecified cervical region: Secondary | ICD-10-CM

## 2017-11-14 DIAGNOSIS — E785 Hyperlipidemia, unspecified: Secondary | ICD-10-CM | POA: Diagnosis not present

## 2017-11-14 DIAGNOSIS — R200 Anesthesia of skin: Secondary | ICD-10-CM

## 2017-11-14 DIAGNOSIS — W57XXXA Bitten or stung by nonvenomous insect and other nonvenomous arthropods, initial encounter: Secondary | ICD-10-CM | POA: Diagnosis not present

## 2017-11-14 DIAGNOSIS — S00462A Insect bite (nonvenomous) of left ear, initial encounter: Secondary | ICD-10-CM | POA: Diagnosis not present

## 2017-11-14 DIAGNOSIS — M4802 Spinal stenosis, cervical region: Secondary | ICD-10-CM | POA: Diagnosis not present

## 2017-11-14 LAB — COMPREHENSIVE METABOLIC PANEL
ALT: 21 IU/L (ref 0–44)
AST: 19 IU/L (ref 0–40)
Albumin/Globulin Ratio: 1.8 (ref 1.2–2.2)
Albumin: 4.8 g/dL (ref 3.5–4.8)
Alkaline Phosphatase: 67 IU/L (ref 39–117)
BUN/Creatinine Ratio: 16 (ref 10–24)
BUN: 17 mg/dL (ref 8–27)
Bilirubin Total: 0.3 mg/dL (ref 0.0–1.2)
CO2: 23 mmol/L (ref 20–29)
Calcium: 9.7 mg/dL (ref 8.6–10.2)
Chloride: 103 mmol/L (ref 96–106)
Creatinine, Ser: 1.05 mg/dL (ref 0.76–1.27)
GFR calc Af Amer: 81 mL/min/{1.73_m2} (ref >59)
GFR calc non Af Amer: 70 mL/min/{1.73_m2} (ref >59)
Globulin, Total: 2.6 g/dL (ref 1.5–4.5)
Glucose: 107 mg/dL — ABNORMAL HIGH (ref 65–99)
Potassium: 4.6 mmol/L (ref 3.5–5.2)
Sodium: 142 mmol/L (ref 134–144)
Total Protein: 7.4 g/dL (ref 6.0–8.5)

## 2017-11-14 LAB — LIPID PANEL
Chol/HDL Ratio: 3.6 ratio (ref 0.0–5.0)
Cholesterol, Total: 142 mg/dL (ref 100–199)
HDL: 40 mg/dL (ref >39)
LDL Calculated: 68 mg/dL (ref 0–99)
Triglycerides: 168 mg/dL — ABNORMAL HIGH (ref 0–149)
VLDL Cholesterol Cal: 34 mg/dL (ref 5–40)

## 2017-11-14 MED ORDER — ROSUVASTATIN CALCIUM 10 MG PO TABS: 10.0000 mg | ORAL_TABLET | Freq: Every day | ORAL | 1 refills | Status: DC

## 2017-11-14 NOTE — Progress Notes (Signed)
Subjective:  By signing my name below, I, [name] [name], attest that this documentation has been prepared under the direction and in the presence of [name], MD Electronically Signed: Ladene Artist, ED Scribe 11/14/2017 at 10:09 AM.   Patient ID: [name] [name], male    DOB: 01/10/1946, 72 y.o.   MRN: [id number]  Chief Complaint  Patient presents with  . both hands    goes to sleep and tingling f/u   . cholestrol    6 weeks f/u ([facility] is for local or need right away meds)   HPI  [name] [name] is a 72 y.o. male who presents to Primary Care at [facility] for f/u. Last seen 7/9. Pt is fasting at this visit.  Hand Dysesthesia See prev visit. Denied weakness. Episodic symptoms in the past, some degenerative changes in c-spine. Gabapentin for neuropathy, takes prn. Discussed frequent position changes while traveling to lessen symptoms. Continued Tylenol and gabapentin. - Pt states that he has been taking gabapentin 2 tabs in the morning, 2 tabs qhs lately since being more active/doing more work around the house. Reports numbness/tingling in both hands that radiates into forearm. States gabapentin doesn't seem to improve hand symptoms. Also reports occasional neck stiffness and soreness. Denies lightheadedness, dizziness, color change, side-effects. Pt has not had a MRI of neck.  Hyperlipidemia Lab Results  Component Value Date   CHOL 200 (H) 08/08/2017   HDL 34 (L) 08/08/2017   LDLCALC 126 (H) 08/08/2017   TRIG 200 (H) 08/08/2017   CHOLHDL 5.9 (H) 08/08/2017   Lab Results  Component Value Date   ALT 14 08/08/2017   AST 16 08/08/2017   ALKPHOS 68 08/08/2017   BILITOT 0.4 08/08/2017  Treatment recommended by cardiology. Started on Crestor 10 mg 7/9. - Denies myalgias, new side-effects.  Tick Bite Pt would also like to have his L ear evaluated. States he removed a tick from his ear a few days ago. Denies any symptoms at this time.  Immunizations Immunization History  Administered  Date(s) Administered  . Hepatitis B 01/14/2011  . Influenza Split 01/14/2011, 01/18/2012  . Influenza,inj,Quad PF,6+ Mos 02/04/2013, 12/26/2013, 01/23/2015, 01/22/2016, 12/16/2016  . Pneumococcal Conjugate-13 05/06/2014  . Pneumococcal Polysaccharide-23 03/28/2004, 04/29/2009  . Tdap 08/11/2009  . Zoster 06/11/2013  Pt would like to receive the flu vaccine at this visit.  Patient Active Problem List   Diagnosis Date Noted  . LBBB (left bundle branch block) 09/07/2017  . Other meniscus derangements, posterior horn of medial meniscus, left knee, insufficency fracture medial tibial plateau 04/07/2017  . Closed nondisplaced fracture of lateral condyle of left tibia with routine healing 04/07/2017  . Status post arthroscopy of left knee 04/07/2017  . Insomnia 09/14/2015  . Hyperlipidemia with target LDL less than 100 09/11/2014  . Rotator cuff tendinitis 04/28/2014  . Obesity (BMI 30-39.9) 12/29/2013  . 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  . Exertional shortness of breath 06/26/2012  . Polyp of colon, adenomatous 04/27/2012  . Elevated PSA 10/20/2011  . Hypertriglyceridemia 07/08/2011  . Nonischemic cardiomyopathy ([place]) 09/04/2009   Past Medical History:  Diagnosis Date  . Arthritis   . BPH (benign prostatic hypertrophy)   . Coronary atherosclerosis of native coronary vessel    CATH 2004--  MINIMAL NONOBSTRUCTIVE LAD/  NORMAL EF/    CARDIAC CT  03/2008  NO SIGNIFICANT DISEASE  AND  nlef  . Elevated PSA   . Eye problem 03/06/2016   [name] retinal detachment left eye, no sx done  .  History of gout    pt 05-20-2013 states stable   . History of melanoma excision    2011-- LEFT UPPER BACK  . Hyperlipemia   . Hypertriglyceridemia   . Melanoma ([place]) 2011   back  . Nonischemic cardiomyopathy ([place])    09-04-2009  --  2D echo-EF 40-45%, Global Hypokinesis  . Personal history of arthritis   . Personal history of other diseases of circulatory system    Past Surgical  History:  Procedure Laterality Date  . APPENDECTOMY  AS CHILD  . CARDIAC CATHETERIZATION  05/23/2002  &  05-23-1998   DR AL LITTLE   minimal irregularities in the LAD/  EF 50%  . CARDIOVASCULAR STRESS TEST  10-05-1998   MILD GLOBAL HYPOKINESIS AND ISCHEMIAIN ANTEROSEPTAL  AT [facility]/ EF 42%  . EXCISION MELANOMA LEFT UPPER BACK  10-14-2009  . eye lid surgery Bilateral 2018  . KNEE ARTHROSCOPY WITH SUBCHONDROPLASTY Left 04/07/2017   Procedure: LEFT KNEE ARTHROSCOPY WITH PARTIAL MEDIAL MENISCECTOMY AND MEDIAL TIBIAL SUBCHONDROPLASTY;  Surgeon: Mcarthur Rossetti, MD;  Location: [facility];  Service: Orthopedics;  Laterality: Left;  . knee injections Bilateral   . LUMBAR DISC SURGERY  09-12-2000   left  L5 -- S1  . PROSTATE BIOPSY N/A 05/22/2013   Procedure: BIOPSY TRANSRECTAL ULTRASONIC PROSTATE (TUBP);  Surgeon: [name], MD;  Location: [facility];  Service: Urology;  Laterality: N/A;  . ROTATOR CUFF REPAIR Right 2016  . TRANSRECTAL ULTRASOUND PROSTATE BX  05-23-2005  &  04-19-2001  . TRANSTHORACIC ECHOCARDIOGRAM  09-04-2009   MILD LVH/  MILD GLOBAL HYPOKINESIS/ LVEF 40-45%/  MILD AV SCLEROSIS WITHOUT STENOSIS/  MILD PVR   Allergies  Allergen Reactions  . Nitroglycerin Anaphylaxis    "Cardiologist suggested he shouldn't take this med because it could kill him with his heart condition. Lowers BP"  . Ace Inhibitors Swelling  . Lovastatin Nausea Only  . Solarcaine [Benzocaine] Dermatitis   Prior to Admission medications   Medication Sig Start Date End Date Taking? Authorizing Provider  aspirin EC 81 MG tablet Take 81 mg by mouth daily.    [provider]  fish oil-omega-3 fatty acids 1000 MG capsule Take 1 g by mouth daily.    [provider]  gabapentin (NEURONTIN) 300 MG capsule Take 1-2 capsules (300-600 mg total) by mouth 2 (two) times daily as needed. 04/06/17   [name], MD  Garlic TABS Take 1 tablet by mouth daily.    [provider]  Multiple Vitamins-Minerals (MULTIVITAMIN PO) Take 1 tablet by mouth daily.    [provider]  naproxen sodium (ANAPROX) 220 MG tablet Take 440 mg by mouth 2 (two) times daily as needed (pain).    [provider]  ondansetron (ZOFRAN ODT) 4 MG disintegrating tablet Take 1 tablet (4 mg total) by mouth every 8 (eight) hours as needed for nausea or vomiting. 08/22/17   [name], MD  polyvinyl alcohol (LIQUIFILM TEARS) 1.4 % ophthalmic solution Place 1 drop into both eyes daily as needed for dry eyes.    [provider]  rosuvastatin (CRESTOR) 10 MG tablet Take 1 tablet (10 mg total) by mouth daily. 10/03/17   [name], MD  sildenafil (VIAGRA) 100 MG tablet 1/2 to 1 tablet up to each day as needed. Patient taking differently: Take 50-100 mg by mouth daily as needed for erectile dysfunction.  07/08/11   [name], MD  Turmeric Curcumin 500 MG CAPS Take 1  tablet by mouth daily.    [provider]  UNABLE TO FIND Equate cold medicine take 2 caps in am and 2 in pm prn    [provider]   Social History   Socioeconomic History  . Marital status: Widowed    Spouse name: Not on file  . Number of children: Not on file  . Years of education: Not on file  . Highest education level: Not on file  Occupational History  . 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  Social Needs  . Financial resource strain: Not on file  . Food insecurity:    Worry: Not on file    Inability: Not on file  . Transportation needs:    Medical: Not on file    Non-medical: Not on file  Tobacco Use  . Smoking status: Former Smoker    Packs/day: 2.00    Years: 20.00    Pack years: 40.00    Types: Cigarettes    Last attempt to quit: 03/28/1980    Years since quitting: 37.6  . Smokeless tobacco: Never Used  Substance and Sexual Activity  . Alcohol use: No    Alcohol/week: 0.0  standard drinks  . Drug use: No  . Sexual activity: Not on file  Lifestyle  . Physical activity:    Days per week: Not on file    Minutes per session: Not on file  . Stress: Not on file  Relationships  . Social connections:    Talks on phone: Not on file    Gets together: Not on file    Attends religious service: Not on file    Active member of club or organization: Not on file    Attends meetings of clubs or organizations: Not on file    Relationship status: Not on file  . Intimate partner violence:    Fear of current or ex partner: Not on file    Emotionally abused: Not on file    Physically abused: Not on file    Forced sexual activity: Not on file  Other Topics Concern  . Not on file  Social History Narrative   He walks on a relatively regular basis about 15 minutes at a time mostly to the discomfort. He previously had walked up a 30-40 minutes a time without problems. Education: [employer].   Review of Systems  Constitutional: Negative for chills, diaphoresis, fatigue, fever and unexpected weight change.  Eyes: Negative for visual disturbance.  Respiratory: Negative for cough, chest tightness and shortness of breath.   Cardiovascular: Negative for chest pain, palpitations and leg swelling.  Gastrointestinal: Negative for abdominal pain and blood in stool.  Musculoskeletal: Negative for myalgias.  Skin: Positive for wound (L ear). Negative for rash.  Neurological: Positive for numbness. Negative for dizziness, light-headedness and headaches.      Objective:   Physical Exam  Constitutional: He is oriented to person, place, and time. He appears well-developed and well-nourished.  HENT:  Head: Normocephalic and atraumatic.  Small abrasion on posterior aspect of L ear. No surrounding erythema. No discharge. No remaining tick parts.  Eyes: Pupils are equal, round, and reactive to light. EOM are normal.  Neck: No JVD present. Carotid bruit is not present.  Cardiovascular:  Normal rate, regular rhythm and normal heart sounds.  No murmur heard. Pulses:      Radial pulses are 2+ on the right side, and 2+ on the left side.  Pulmonary/Chest:  Effort normal and breath sounds normal. He has no rales.  Musculoskeletal: He exhibits no edema.  NVI distally. Cap refill less than 1 sec. Fingertips are warm. Equal grip strength. Neg Tinel. Neg Phalen. Minimal extension of c-spine. Rotation to R and L ~45 degrees. Minimal lateral flexion bilaterally but neck ROM didn't change hand paraesthesias.  Neurological: He is alert and oriented to person, place, and time.  Skin: Skin is warm and dry.  Psychiatric: He has a normal mood and affect.  Vitals reviewed.  Vitals:   11/14/17 0912  BP: 111/64  Pulse: 78  Temp: (!) 97.5 F (36.4 C)  TempSrc: Oral  SpO2: 97%  Weight: 196 lb 6.4 oz (89.1 kg)  Height: 5' 8.5" (1.74 m)  Dg Cervical Spine Complete  Result Date: 11/14/2017 CLINICAL DATA:  Bilateral arm and hand numbness. Occasional neck pain. EXAM: CERVICAL SPINE - COMPLETE 4+ VIEW COMPARISON:  11/04/2013 FINDINGS: Vertebral alignment is normal. Mild disc space narrowing and degenerative endplate spurring at F5-7 are stable to minimally increased. Mild left-sided osseous neural foraminal narrowing is again noted at C3-4. Disc space heights are preserved elsewhere. No fracture is identified. The prevertebral soft tissues are within normal limits. Aortic atherosclerosis is noted. 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 Electronically Signed   By: Logan Bores M.D.   On: 11/14/2017 10:42       Assessment & Plan:    [name] [name] is a 72 y.o. male Hyperlipidemia, unspecified hyperlipidemia type - Plan: Comprehensive metabolic panel, Lipid panel, rosuvastatin (CRESTOR) 10 MG tablet  - tolerating current dose of crestor.  Labs pending, continue same for now.   Bilateral hand numbness - Plan: DG Cervical Spine Complete, Ambulatory  referral to Neurology DDD (degenerative disc disease), cervical - Plan: DG Cervical Spine Complete  -Mild cervical degenerative disc disease, with overall stable versus minimally progressed as above.  Still may be cervical radiculopathy, but will refer to neurology to discuss nerve conduction testing versus other evaluation and treatment.  Continue gabapentin for now.  Tick bite of left ear, initial encounter  -Small abrasion, no surrounding erythema, no other rash or concerning tickborne illness symptoms.  -continue symptomatic care, RTC precautions, handout on tick bites  Meds ordered this encounter  Medications  . rosuvastatin (CRESTOR) 10 MG tablet    Sig: Take 1 tablet (10 mg total) by mouth daily.    Dispense:  90 tablet    Refill:  1   Patient Instructions   Hand tingling may still be related to your neck, but I will refer you to neurology to discuss other testing, possible nerve conduction testing to determine source of those symptoms.  I will check the neck x-ray today to see if there have been any significant changes from your prior imaging.  Okay to continue Tylenol, gabapentin as needed.   Continue Crestor same dose, I am rechecking  lab work today.  Follow-up in 6 months to review that medication further.  Wound on the ear appears to be a small abrasion from tick bite.  I do not see any surrounding redness or concerning findings.  If any new headache, fever, rash return right away.  I do not expect this to happen.  See other information on tick bites below.  Thank you for coming in today.   Tick Bite Information, Adult Ticks are insects that can bite. Most ticks live in shrubs and grassy areas. They climb onto people and animals that go by. Then  they bite. Some ticks carry germs that can make you sick. How can I prevent tick bites?  Use an insect repellent that has 20% or higher of the ingredients DEET, picaridin, or IR3535. Put this insect repellent on: ? Bare  skin. ? The tops of your boots. ? Your pant legs. ? The ends of your sleeves.  If you use an insect repellent that has the ingredient permethrin, make sure to follow the instructions on the bottle. Treat the following: ? Clothing. ? Supplies. ? Boots. ? Tents.  Wear long sleeves, long pants, and light colors.  Tuck your pant legs into your socks.  Stay in the middle of the trail.  Try not to walk through long grass.  Before going inside your house, check your clothes, hair, and skin for ticks. Make sure to check your head, neck, armpits, waist, groin, and joint areas.  Check for ticks every day.  When you come indoors: ? Wash your clothes right away. ? Shower right away. ? Dry your clothes in a dryer on high heat for 60 minutes or more. What is the right way to remove a tick? Remove a tick from your skin as soon as possible.  To remove a tick that is crawling on your skin: ? Go outdoors and brush the tick off. ? Use tape or a lint roller.  To remove a tick that is biting: ? Wash your hands. ? If you have latex gloves, put them on. ? Use tweezers, curved forceps, or a tick-removal tool to grasp the tick. Grasp the tick as close to your skin and as close to the tick's head as possible. ? Gently pull up until the tick lets go.  Try to keep the tick's head attached to its body.  Do not twist or jerk the tick.  Do not squeeze or crush the tick.  Do not try to remove a tick with heat, alcohol, petroleum jelly, or fingernail polish. How should I get rid of a tick? Here are some ways to get rid of a tick that is alive:  Place the tick in rubbing alcohol.  Place the tick in a bag or container you can close tightly.  Wrap the tick tightly in tape.  Flush the tick down the toilet.  Contact a doctor if:  You have symptoms of a disease, such as: ? Pain in a muscle, joint, or bone. ? Trouble walking or moving your legs. ? Numbness in your legs. ? Inability to move  (paralysis). ? A red rash that makes a circle (bull's-eye rash). ? Redness and swelling where the tick bit you. ? A fever. ? Throwing up (vomiting) over and over. ? Diarrhea. ? Weight loss. ? Tender and swollen lymph glands. ? Shortness of breath. ? Cough. ? Belly pain (abdominal pain). ? Headache. ? Being more tired than normal. ? A change in how alert (conscious) you are. ? Confusion. Get help right away if:  You cannot remove a tick.  A part of a tick breaks off and gets stuck in your skin.  You are feeling worse. Summary  Ticks may carry germs that can make you sick.  To prevent tick bites, wear long sleeves, long pants, and light colors. Use insect repellent. Follow the instructions on the bottle.  If the tick is biting, do not try to remove it with heat, alcohol, petroleum jelly, or fingernail polish.  Use tweezers, curved forceps, or a tick-removal tool to grasp the tick. Gently pull up until  the tick lets go. Do not twist or jerk the tick. Do not squeeze or crush the tick.  If you have symptoms, contact a doctor. This information is not intended to replace advice given to you by your health care provider. Make sure you discuss any questions you have with your health care provider. Document Released: 06/08/2009 Document Revised: 06/24/2016 Document Reviewed: 06/24/2016 Elsevier Interactive Patient Education  [employer].   If you have lab work done today you will be contacted with your lab results within the next 2 weeks.  If you have not heard from [place] then please contact us. The fastest way to get your results is to register for My Chart.   IF you received an x-ray today, you will receive an invoice from [facility]. Please contact [facility] at [phone] with questions or concerns regarding your invoice.   IF you received labwork today, you will receive an invoice from [facility]. Please contact [facility] at [phone] with questions or  concerns regarding your invoice.   Our billing staff will not be able to assist you with questions regarding bills from these companies.  You will be contacted with the lab results as soon as they are available. The fastest way to get your results is to activate your My Chart account. Instructions are located on the last page of this paperwork. If you have not heard from [place] regarding the results in 2 weeks, please contact this office.       I personally performed the services described in this documentation, which was scribed in my presence. The recorded information has been reviewed and considered for accuracy and completeness, addended by me as needed, and agree with information above.  Signed,   [name], MD [facility] at [place].  11/15/17 9:52 PM

## 2017-11-14 NOTE — Patient Instructions (Addendum)
Hand tingling may still be related to your neck, but I will refer you to neurology to discuss other testing, possible nerve conduction testing to determine source of those symptoms.  I will check the neck x-ray today to see if there have been any significant changes from your prior imaging.  Okay to continue Tylenol, gabapentin as needed.   Continue Crestor same dose, I am rechecking  lab work today.  Follow-up in 6 months to review that medication further.  Wound on the ear appears to be a small abrasion from tick bite.  I do not see any surrounding redness or concerning findings.  If any new headache, fever, rash return right away.  I do not expect this to happen.  See other information on tick bites below.  Thank you for coming in today.   Tick Bite Information, Adult Ticks are insects that can bite. Most ticks live in shrubs and grassy areas. They climb onto people and animals that go by. Then they bite. Some ticks carry germs that can make you sick. How can I prevent tick bites?  Use an insect repellent that has 20% or higher of the ingredients DEET, picaridin, or IR3535. Put this insect repellent on: ? Bare skin. ? The tops of your boots. ? Your pant legs. ? The ends of your sleeves.  If you use an insect repellent that has the ingredient permethrin, make sure to follow the instructions on the bottle. Treat the following: ? Clothing. ? Supplies. ? Boots. ? Tents.  Wear long sleeves, long pants, and light colors.  Tuck your pant legs into your socks.  Stay in the middle of the trail.  Try not to walk through long grass.  Before going inside your house, check your clothes, hair, and skin for ticks. Make sure to check your head, neck, armpits, waist, groin, and joint areas.  Check for ticks every day.  When you come indoors: ? Wash your clothes right away. ? Shower right away. ? Dry your clothes in a dryer on high heat for 60 minutes or more. What is the right way to  remove a tick? Remove a tick from your skin as soon as possible.  To remove a tick that is crawling on your skin: ? Go outdoors and brush the tick off. ? Use tape or a lint roller.  To remove a tick that is biting: ? Wash your hands. ? If you have latex gloves, put them on. ? Use tweezers, curved forceps, or a tick-removal tool to grasp the tick. Grasp the tick as close to your skin and as close to the tick's head as possible. ? Gently pull up until the tick lets go.  Try to keep the tick's head attached to its body.  Do not twist or jerk the tick.  Do not squeeze or crush the tick.  Do not try to remove a tick with heat, alcohol, petroleum jelly, or fingernail polish. How should I get rid of a tick? Here are some ways to get rid of a tick that is alive:  Place the tick in rubbing alcohol.  Place the tick in a bag or container you can close tightly.  Wrap the tick tightly in tape.  Flush the tick down the toilet.  Contact a doctor if:  You have symptoms of a disease, such as: ? Pain in a muscle, joint, or bone. ? Trouble walking or moving your legs. ? Numbness in your legs. ? Inability to move (paralysis). ?  A red rash that makes a circle (bull's-eye rash). ? Redness and swelling where the tick bit you. ? A fever. ? Throwing up (vomiting) over and over. ? Diarrhea. ? Weight loss. ? Tender and swollen lymph glands. ? Shortness of breath. ? Cough. ? Belly pain (abdominal pain). ? Headache. ? Being more tired than normal. ? A change in how alert (conscious) you are. ? Confusion. Get help right away if:  You cannot remove a tick.  A part of a tick breaks off and gets stuck in your skin.  You are feeling worse. Summary  Ticks may carry germs that can make you sick.  To prevent tick bites, wear long sleeves, long pants, and light colors. Use insect repellent. Follow the instructions on the bottle.  If the tick is biting, do not try to remove it with heat,  alcohol, petroleum jelly, or fingernail polish.  Use tweezers, curved forceps, or a tick-removal tool to grasp the tick. Gently pull up until the tick lets go. Do not twist or jerk the tick. Do not squeeze or crush the tick.  If you have symptoms, contact a doctor. This information is not intended to replace advice given to you by your health care provider. Make sure you discuss any questions you have with your health care provider. Document Released: 06/08/2009 Document Revised: 06/24/2016 Document Reviewed: 06/24/2016 Elsevier Interactive Patient Education  [employer].   If you have lab work done today you will be contacted with your lab results within the next 2 weeks.  If you have not heard from [place] then please contact us. The fastest way to get your results is to register for My Chart.   IF you received an x-ray today, you will receive an invoice from [facility]. Please contact [facility] at [phone] with questions or concerns regarding your invoice.   IF you received labwork today, you will receive an invoice from [place]. Please contact [facility] at [phone] with questions or concerns regarding your invoice.   Our billing staff will not be able to assist you with questions regarding bills from these companies.  You will be contacted with the lab results as soon as they are available. The fastest way to get your results is to activate your My Chart account. Instructions are located on the last page of this paperwork. If you have not heard from [place] regarding the results in 2 weeks, please contact this office.

## 2017-11-14 NOTE — Telephone Encounter (Signed)
Talked with [name] [name] at [facility] and provided her patient's second insurance information

## 2017-11-29 ENCOUNTER — Encounter: Payer: Self-pay | Admitting: *Deleted

## 2017-11-30 DIAGNOSIS — R972 Elevated prostate specific antigen [PSA]: Secondary | ICD-10-CM | POA: Diagnosis not present

## 2017-12-01 ENCOUNTER — Telehealth (INDEPENDENT_AMBULATORY_CARE_PROVIDER_SITE_OTHER): Payer: Self-pay

## 2017-12-01 NOTE — Telephone Encounter (Signed)
Patient is approved for [employer], left knee. Buy & Bill Covered at 100% per insurance's No Co-pay No PA required  Appt.scheduled 12/11/2017

## 2017-12-06 ENCOUNTER — Telehealth: Payer: Self-pay | Admitting: Family Medicine

## 2017-12-06 NOTE — Telephone Encounter (Signed)
Copied from [place] [phone]. Topic: Quick Communication - Lab Results >> Dec 06, 2017  1:41 PM Cecelia Byars, NT wrote: Patient called to follow up with his request to have his labs mailed to him from his last visit please advise

## 2017-12-07 DIAGNOSIS — R972 Elevated prostate specific antigen [PSA]: Secondary | ICD-10-CM | POA: Diagnosis not present

## 2017-12-07 DIAGNOSIS — N5201 Erectile dysfunction due to arterial insufficiency: Secondary | ICD-10-CM | POA: Diagnosis not present

## 2017-12-07 DIAGNOSIS — N400 Enlarged prostate without lower urinary tract symptoms: Secondary | ICD-10-CM | POA: Diagnosis not present

## 2017-12-07 NOTE — Telephone Encounter (Signed)
Letter was sent

## 2017-12-11 ENCOUNTER — Ambulatory Visit (INDEPENDENT_AMBULATORY_CARE_PROVIDER_SITE_OTHER): Payer: Medicare Other | Admitting: Physician Assistant

## 2017-12-11 ENCOUNTER — Encounter (INDEPENDENT_AMBULATORY_CARE_PROVIDER_SITE_OTHER): Payer: Self-pay | Admitting: Physician Assistant

## 2017-12-11 DIAGNOSIS — M1712 Unilateral primary osteoarthritis, left knee: Secondary | ICD-10-CM | POA: Diagnosis not present

## 2017-12-11 MED ORDER — HYALURONAN 88 MG/4ML IX SOSY
88.0000 mg | PREFILLED_SYRINGE | INTRA_ARTICULAR | Status: AC | PRN
  Administered 2017-12-11: 88 mg via INTRA_ARTICULAR

## 2017-12-11 NOTE — Progress Notes (Signed)
   Procedure Note  Patient: [name] [name]             Date of Birth: 19-Jan-1946           MRN: [id number]             Visit Date: 12/11/2017  HPI: Mr. Mitton comes in today for Monovisc injection left knee.  He has had no new injury of the knee.  He states he has good and bad days.  Physical exam left knee no effusion abnormal warmth erythema.  Good range of motion.  Nontender throughout. Procedures: Visit Diagnoses: Primary osteoarthritis of left knee  Large Joint Inj: L knee on 12/11/2017 1:31 PM Indications: pain Details: 22 G 1.5 in needle, anterolateral approach  Arthrogram: No  Medications: 88 mg Hyaluronan 88 MG/4ML Outcome: tolerated well, no immediate complications Procedure, treatment alternatives, risks and benefits explained, specific risks discussed. Consent was given by the patient. Immediately prior to procedure a time out was called to verify the correct patient, procedure, equipment, support staff and site/side marked as required. Patient was prepped and draped in the usual sterile fashion.     Plan: He will follow with [place] on as-needed basis.  He understands that he can have the Monovisc injections no more often than every 6 months.  Questions were encouraged and answered.

## 2018-01-10 ENCOUNTER — Ambulatory Visit (INDEPENDENT_AMBULATORY_CARE_PROVIDER_SITE_OTHER): Payer: Medicare Other | Admitting: Neurology

## 2018-01-10 ENCOUNTER — Telehealth: Payer: Self-pay | Admitting: Neurology

## 2018-01-10 ENCOUNTER — Encounter: Payer: Self-pay | Admitting: Neurology

## 2018-01-10 VITALS — BP 122/64 | HR 80 | Ht 68.5 in | Wt 196.0 lb

## 2018-01-10 DIAGNOSIS — R202 Paresthesia of skin: Secondary | ICD-10-CM | POA: Diagnosis not present

## 2018-01-10 DIAGNOSIS — Z23 Encounter for immunization: Secondary | ICD-10-CM | POA: Diagnosis not present

## 2018-01-10 DIAGNOSIS — M542 Cervicalgia: Secondary | ICD-10-CM | POA: Diagnosis not present

## 2018-01-10 MED ORDER — DULOXETINE HCL 60 MG PO CPEP: 60.0000 mg | ORAL_CAPSULE | Freq: Every day | ORAL | 3 refills | Status: DC

## 2018-01-10 MED ORDER — ALPRAZOLAM 1 MG PO TABS: 1.0000 mg | ORAL_TABLET | Freq: Every evening | ORAL | 0 refills | Status: DC | PRN

## 2018-01-10 NOTE — Telephone Encounter (Signed)
Medicare/medico order faxed to [facility] they will reach out to the pt to schedule.

## 2018-01-10 NOTE — Progress Notes (Signed)
PATIENT: [name] [name] DOB: [birth year]  Chief Complaint  Patient presents with  . Numbness    He is here with his significant other, [name], to have his bilateral hand numbness further evaluated.  Symptoms radiate to his forearms.  He takes gabapentin 600mg , BID, as needed, for neuropathy but it does not help his hands.  Marland Kitchen PCP    [name], MD     HISTORICAL  [name] [name] is a 72 years old male, seen in request by his primary care physician Dr. [name] for evaluation of numbness, initial evaluation was on Jan 10 2018.  He is accompanied by his friend [name] at today's visit.  I have reviewed and summarized the referring note from the referring physician.  He has past medical history of hyperlipidemia, lumbar decompression, left shoulder and knee surgery in the past,  He complains of intermittent bilateral arm paresthesia from elbow down since 2014, it is getting worse, sometimes triggered by lying on his shoulder, after cutting foods, becomes deep achy pain, numbness, he also complains of intermittent neck pain, especially when turning his neck  He also has multiple joints pain, left knee, left shoulder, left foot, had a history of left lumbar radiculopathy, previous lumbar decompression surgery, which has helped his symptoms, but he still has frequent left calf muscle cramping, intermittent left foot paresthesia, recently flareup of left knee pain, limited response to left knee injection,  Laboratory evaluation in 2019 showed normal or negative TSH, B12, CMP, A1c was 6.0, lipid profile showed LDL of 126, triglyceride was 200  REVIEW OF SYSTEMS: Full 14 system review of systems performed and notable only for hearing loss, blood in stool, easy bleeding, joint pain, swelling, cramps, aching muscles, sleepiness, restless leg All other review of systems were negative.  ALLERGIES: Allergies  Allergen Reactions  . Nitroglycerin Anaphylaxis    "Cardiologist suggested he  shouldn't take this med because it could kill him with his heart condition. Lowers BP"  . Ace Inhibitors Swelling  . Lovastatin Nausea Only  . Solarcaine [Benzocaine] Dermatitis    HOME MEDICATIONS: Current Outpatient Medications  Medication Sig Dispense Refill  . aspirin EC 81 MG tablet Take 81 mg by mouth daily.    . fish oil-omega-3 fatty acids 1000 MG capsule Take 1 g by mouth daily.    Marland Kitchen gabapentin (NEURONTIN) 300 MG capsule Take 1-2 capsules (300-600 mg total) by mouth 2 (two) times daily as needed. 360 capsule 1  . Garlic TABS Take 1 tablet by mouth daily.    . Multiple Vitamins-Minerals (MULTIVITAMIN PO) Take 1 tablet by mouth daily.    . polyvinyl alcohol (LIQUIFILM TEARS) 1.4 % ophthalmic solution Place 1 drop into both eyes daily as needed for dry eyes.    . rosuvastatin (CRESTOR) 10 MG tablet Take 1 tablet (10 mg total) by mouth daily. 90 tablet 1  . sildenafil (VIAGRA) 100 MG tablet 1/2 to 1 tablet up to each day as needed. (Patient taking differently: Take 50-100 mg by mouth daily as needed for erectile dysfunction. ) 10 tablet 5  . Turmeric Curcumin 500 MG CAPS Take 1 tablet by mouth daily.    Marland Kitchen UNABLE TO FIND Equate cold medicine take 2 caps in am and 2 in pm prn     No current facility-administered medications for this visit.     PAST MEDICAL HISTORY: Past Medical History:  Diagnosis Date  . Arthritis   . BPH (benign prostatic hypertrophy)   . Coronary  atherosclerosis of native coronary vessel    CATH 2004--  MINIMAL NONOBSTRUCTIVE LAD/  NORMAL EF/    CARDIAC CT  03/2008  NO SIGNIFICANT DISEASE  AND  nlef  . Elevated PSA   . Eye problem 03/06/2016   [name] retinal detachment left eye, no sx done  . History of gout    pt 05-20-2013 states stable   . History of melanoma excision    2011-- LEFT UPPER BACK  . Hyperlipemia   . Hypertriglyceridemia   . Melanoma ([place]) 2011   back  . Nonischemic cardiomyopathy ([place])    09-04-2009  --  2D echo-EF 40-45%, Global  Hypokinesis  . Numbness    hands  . Personal history of arthritis   . Personal history of other diseases of circulatory system     PAST SURGICAL HISTORY: Past Surgical History:  Procedure Laterality Date  . APPENDECTOMY  AS CHILD  . CARDIAC CATHETERIZATION  05/23/2002  &  05-23-1998   DR AL LITTLE   minimal irregularities in the LAD/  EF 50%  . CARDIOVASCULAR STRESS TEST  10-05-1998   MILD GLOBAL HYPOKINESIS AND ISCHEMIAIN ANTEROSEPTAL  AT [facility]/ EF 42%  . EXCISION MELANOMA LEFT UPPER BACK  10-14-2009  . eye lid surgery Bilateral 2018  . KNEE ARTHROSCOPY WITH SUBCHONDROPLASTY Left 04/07/2017   Procedure: LEFT KNEE ARTHROSCOPY WITH PARTIAL MEDIAL MENISCECTOMY AND MEDIAL TIBIAL SUBCHONDROPLASTY;  Surgeon: Mcarthur Rossetti, MD;  Location: [facility];  Service: Orthopedics;  Laterality: Left;  . knee injections Bilateral   . LUMBAR DISC SURGERY  09-12-2000   left  L5 -- S1  . PROSTATE BIOPSY N/A 05/22/2013   Procedure: BIOPSY TRANSRECTAL ULTRASONIC PROSTATE (TUBP);  Surgeon: [name], MD;  Location: [facility];  Service: Urology;  Laterality: N/A;  . ROTATOR CUFF REPAIR Right 2016  . TRANSRECTAL ULTRASOUND PROSTATE BX  05-23-2005  &  04-19-2001  . TRANSTHORACIC ECHOCARDIOGRAM  09-04-2009   MILD LVH/  MILD GLOBAL HYPOKINESIS/ LVEF 40-45%/  MILD AV SCLEROSIS WITHOUT STENOSIS/  MILD PVR    FAMILY HISTORY: Family History  Problem Relation Age of Onset  . Arthritis Mother   . COPD Father   . Cancer Father        lung cancer  . Cancer Sister   . Cardiomyopathy Unknown        idiopathic. trivial disease 2004 cath, 2010 cardiac CT - no sig. dz, nl LV fxn. cards: Little  . Colon cancer Neg Hx     SOCIAL HISTORY: Social History   Socioeconomic History  . Marital status: Widowed    Spouse name: Not on file  . Number of children: 2  . Years of education: 40  . Highest education level: High school graduate  Occupational History  . 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  Social Needs  . Financial resource strain: Not on file  . Food insecurity:    Worry: Not on file    Inability: Not on file  . Transportation needs:    Medical: Not on file    Non-medical: Not on file  Tobacco Use  . Smoking status: Former Smoker    Packs/day: 2.00    Years: 20.00    Pack years: 40.00    Types: Cigarettes    Last attempt to quit: 03/28/1980    Years since quitting: 37.8  . Smokeless tobacco: Never Used  Substance and Sexual Activity  . Alcohol use: No  Alcohol/week: 0.0 standard drinks  . Drug use: No  . Sexual activity: Not on file  Lifestyle  . Physical activity:    Days per week: Not on file    Minutes per session: Not on file  . Stress: Not on file  Relationships  . Social connections:    Talks on phone: Not on file    Gets together: Not on file    Attends religious service: Not on file    Active member of club or organization: Not on file    Attends meetings of clubs or organizations: Not on file    Relationship status: Not on file  . Intimate partner violence:    Fear of current or ex partner: Not on file    Emotionally abused: Not on file    Physically abused: Not on file    Forced sexual activity: Not on file  Other Topics Concern  . Not on file  Social History Narrative   He walks on a relatively regular basis about 15 minutes at a time mostly to the discomfort. He previously had walked up a 30-40 minutes a time without problems. Education: [employer].   Lives alone.   Right-handed.   One cup coffee daily.     PHYSICAL EXAM   Vitals:   01/10/18 0843  BP: 122/64  Pulse: 80  Weight: 196 lb (88.9 kg)  Height: 5' 8.5" (1.74 m)    Not recorded      Body mass index is 29.37 kg/m.  PHYSICAL EXAMNIATION:  Gen: NAD, conversant, well nourised, obese, well groomed                     Cardiovascular: Regular rate rhythm, no peripheral edema, warm, nontender. Eyes:  Conjunctivae clear without exudates or hemorrhage Neck: Supple, no carotid bruits. Pulmonary: Clear to auscultation bilaterally   NEUROLOGICAL EXAM:  MENTAL STATUS: Speech:    Speech is normal; fluent and spontaneous with normal comprehension.  Cognition:     Orientation to time, place and person     Normal recent and remote memory     Normal Attention span and concentration     Normal Language, naming, repeating,spontaneous speech     Fund of knowledge   CRANIAL NERVES: CN II: Visual fields are full to confrontation. Fundoscopic exam is normal with sharp discs and no vascular changes. Pupils are round equal and briskly reactive to light. CN III, IV, VI: extraocular movement are normal. No ptosis. CN V: Facial sensation is intact to pinprick in all 3 divisions bilaterally. Corneal responses are intact.  CN VII: Face is symmetric with normal eye closure and smile. CN VIII: Hearing is normal to rubbing fingers CN IX, X: Palate elevates symmetrically. Phonation is normal. CN XI: Head turning and shoulder shrug are intact CN XII: Tongue is midline with normal movements and no atrophy.  MOTOR: There is no pronator drift of out-stretched arms. Muscle bulk and tone are normal. Muscle strength is normal.  REFLEXES: Reflexes are 2+ and symmetric at the biceps, triceps, knees, and absent at ankles. Plantar responses are flexor.  SENSORY: Length dependent decreased to light touch, pinprick,and vibratory sensation to bilateral distal shin level  COORDINATION: Rapid alternating movements and fine finger movements are intact. There is no dysmetria on finger-to-nose and heel-knee-shin.    GAIT/STANCE: Need to push up to get up from seated position, wide-based mildly unsteady cautious, Romberg is absent.   DIAGNOSTIC DATA (LABS, IMAGING, TESTING) - I reviewed patient records,  labs, notes, testing and imaging myself where available.   ASSESSMENT AND PLAN  [name] [name] is a 72 y.o.  male   Bilateral upper extremity paresthesia, feet paresthesia  Differentiation diagnosis including peripheral neuropathy, with superimposed cervical spondylitic myelopathy  EMG nerve conduction study  MRI of neck   [name] [name], M.D. Ph.D.  [facility] [address] [place], [place] [address] Ph: [phone] Fax: [phone]   CC:  [name], MD

## 2018-01-17 ENCOUNTER — Encounter: Payer: Self-pay | Admitting: Neurology

## 2018-01-17 DIAGNOSIS — M4712 Other spondylosis with myelopathy, cervical region: Secondary | ICD-10-CM | POA: Diagnosis not present

## 2018-01-18 ENCOUNTER — Telehealth: Payer: Self-pay | Admitting: Neurology

## 2018-01-18 DIAGNOSIS — G959 Disease of spinal cord, unspecified: Secondary | ICD-10-CM

## 2018-01-18 NOTE — Telephone Encounter (Signed)
Patient is in the lobby wanting to speak with the nurse regarding recent phone call and instructions. He has MRI discs from facility who did the scan Best call back is [phone]

## 2018-01-18 NOTE — Telephone Encounter (Signed)
Left messages, on both home and mobile numbers, requesting the patient call me back.

## 2018-01-18 NOTE — Telephone Encounter (Signed)
Spoke to patient - he is aware of his MRI results and agreeable to see neurosurgery.  He is going to pick up his MRI disc from our office today.  [name] [name] has already contacted [facility].  They are paging the on call surgeon, Dr. Christella Noa, to discuss his case.  Hinton Dyer will contact the patient, once his appt has been scheduled.

## 2018-01-18 NOTE — Addendum Note (Signed)
Addended by: Marcial Pacas on: 01/18/2018 01:35 PM   Modules accepted: Orders

## 2018-01-18 NOTE — Telephone Encounter (Signed)
Patient came to the front and I gave him his MRI CD and relayed that patient [facility] will call him for an apt . Gave patient telephone number as well as address . Patient also signed a DPR.

## 2018-01-18 NOTE — Telephone Encounter (Signed)
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

## 2018-01-18 NOTE — Telephone Encounter (Signed)
There is no CD yet because he just had the MRI.. I gave the written report to [facility]

## 2018-01-19 DIAGNOSIS — M4802 Spinal stenosis, cervical region: Secondary | ICD-10-CM | POA: Diagnosis not present

## 2018-01-19 NOTE — Telephone Encounter (Signed)
Patient is has an apt 01/19/2018 with Dr. Sherley Bounds at 8:45 am . Patient is aware of all details.

## 2018-01-30 ENCOUNTER — Telehealth: Payer: Self-pay | Admitting: Neurology

## 2018-01-30 NOTE — Telephone Encounter (Signed)
His appt has been canceled.  

## 2018-01-30 NOTE — Telephone Encounter (Signed)
Please cancel his EMG.NCS study appt on Nov 15th, cervical decompression surgery is pending,

## 2018-02-09 ENCOUNTER — Encounter: Payer: Medicare Other | Admitting: Neurology

## 2018-02-20 DIAGNOSIS — M4802 Spinal stenosis, cervical region: Secondary | ICD-10-CM | POA: Diagnosis not present

## 2018-02-27 ENCOUNTER — Other Ambulatory Visit: Payer: Self-pay | Admitting: Family Medicine

## 2018-02-27 DIAGNOSIS — E785 Hyperlipidemia, unspecified: Secondary | ICD-10-CM

## 2018-02-27 MED ORDER — ROSUVASTATIN CALCIUM 10 MG PO TABS: 10.0000 mg | ORAL_TABLET | Freq: Every day | ORAL | 1 refills | Status: DC

## 2018-02-27 NOTE — Telephone Encounter (Signed)
Copied from [place] [id number]. Topic: General - Other >> Feb 27, 2018  9:57 AM Lennox Solders wrote: Reason for CRM:  pt needs [facility]or [facility] w/refills. Pt would like mail order

## 2018-03-13 ENCOUNTER — Other Ambulatory Visit: Payer: Self-pay | Admitting: Neurological Surgery

## 2018-03-13 DIAGNOSIS — M4802 Spinal stenosis, cervical region: Secondary | ICD-10-CM | POA: Diagnosis not present

## 2018-03-14 ENCOUNTER — Telehealth: Payer: Self-pay

## 2018-03-14 NOTE — Telephone Encounter (Signed)
Primary Cardiologist: [facility] Pre-operative Risk Assessment    Request for surgical clearance:  1. What type of surgery is being performed?  C3-4, C4-5, C5-6 Anterior Cervical Fusion  2. When is this surgery scheduled?  04/13/18   3. What type of clearance is required (medical clearance vs. Pharmacy clearance to hold med vs. Both)? Both  4. Are there any medications that need to be held prior to surgery and how long? Not listed   5. Practice name and name of physician performing surgery?  [facility] (Dr.David Ronnald Ramp)   6. What is your office phone number  [phone]   7.   What is your office fax number [phone]  8.   Anesthesia type (None, local, MAC, general) ? General  [name] [name] 03/14/2018, 1:45 PM  _________________________________________________________________   (provider comments below)

## 2018-03-15 ENCOUNTER — Telehealth: Payer: Self-pay | Admitting: Family Medicine

## 2018-03-15 NOTE — Telephone Encounter (Signed)
Preoperative medical clearance form received for surgery scheduled January 17.  Please schedule patient for appointment to discuss preoperative clearance unless that would be completed by his cardiologist.  Will still recommend that cardiology clear from a cardiac standpoint.

## 2018-03-16 ENCOUNTER — Telehealth: Payer: Self-pay | Admitting: Family Medicine

## 2018-03-16 NOTE — Telephone Encounter (Signed)
   Primary Cardiologist: Glenetta Hew, MD  Chart reviewed as part of pre-operative protocol coverage. Patient was contacted 03/16/2018 in reference to pre-operative risk assessment for pending surgery as outlined below.  Torri Langston was last seen on 09/07/17 by Dr. Ellyn Hack.  Since that day, Kalid Ghan has done well. No anginal symptoms. He is able to perform > 4 METs (walk up a flight of stairs and exercises daily) w/o exertional CP or dyspnea.   Therefore, based on ACC/AHA guidelines, the patient would be at acceptable risk for the planned procedure without further cardiovascular testing.   Can hold ASA 7 days prior to surgery if necessary, but resume as soon as safe to do so from a surgical standpoint.   I will route this recommendation to the requesting party via Epic fax function and remove from pre-op pool.  Please call with questions.  Lyda Jester, PA-C 03/16/2018, 9:48 AM

## 2018-03-16 NOTE — Telephone Encounter (Signed)
Called and LVM for pt to call the office to schedule an appt for surgery clearance per Dr. Carlota Raspberry. When pt calls back, please schedule an appt with Dr. Carlota Raspberry for a pre-op clearance. Thank you!

## 2018-03-26 ENCOUNTER — Ambulatory Visit: Payer: Medicare Other | Admitting: Family Medicine

## 2018-03-26 ENCOUNTER — Ambulatory Visit (INDEPENDENT_AMBULATORY_CARE_PROVIDER_SITE_OTHER): Payer: Medicare Other | Admitting: Family Medicine

## 2018-03-26 ENCOUNTER — Other Ambulatory Visit: Payer: Self-pay

## 2018-03-26 ENCOUNTER — Encounter: Payer: Self-pay | Admitting: Family Medicine

## 2018-03-26 VITALS — BP 120/64 | HR 95 | Temp 97.8°F | Ht 70.0 in | Wt 200.6 lb

## 2018-03-26 DIAGNOSIS — Z7982 Long term (current) use of aspirin: Secondary | ICD-10-CM

## 2018-03-26 DIAGNOSIS — R7303 Prediabetes: Secondary | ICD-10-CM | POA: Diagnosis not present

## 2018-03-26 DIAGNOSIS — Z130 Encounter for screening for diseases of the blood and blood-forming organs and certain disorders involving the immune mechanism: Secondary | ICD-10-CM

## 2018-03-26 DIAGNOSIS — Z01818 Encounter for other preprocedural examination: Secondary | ICD-10-CM

## 2018-03-26 NOTE — Progress Notes (Signed)
By signing my name below, I, Schuyler Bain, attest that this documentation has been prepared under the direction and in the presence of Dr. Carlota Raspberry. Electronically Signed: Baldwin Jamaica, Medical Scribe 03/26/18   Subjective:    Patient ID: [name] [name], male    DOB: March 18, 1946, 72 y.o.   MRN: [id number] Chief Complaint  Patient presents with  . surgical clearance    surgery on 04/20/2018   HPI [name] [name] is a 72 y.o. male who presents to Primary Care at [facility] for surgical clearance.   Planning on having C3-C4, C4-C5, C5-C6 with general anesthesia, with Dr. Sherley Bounds on 04/20/18. He does have a history of non-ischemic cardiomyopathy, polyneuropathy, and hyperlipidemia.  Cardiologist is [facility] [place] Dr. Ellyn Hack who has a telephone note from 03/16/18, acceptable risk for a procedure, aspirin to be held 7 days prior, then resume when safe from surgical stand point. CXR order pending from surgeon Dr. Ronnald Ramp. Last CBC, BMP, and INR ordered on 03/13/18. Previous Glucose of 107 on 11/14/17, and an A1C of 6.0 in May.  The pt has not had a reaction to anesthesia in the past. No history of sleep apnea. He notes that if he gets a full night's sleep, then he does not feel sleepy during the day.   Denies recent colds, coughs, or SOB, blood in the stool, blood in the urine.   Pt is not smoking or consuming alcohol.   Patient Active Problem List   Diagnosis Date Noted  . Paresthesia 01/10/2018  . Neck pain 01/10/2018  . LBBB (left bundle branch block) 09/07/2017  . Other meniscus derangements, posterior horn of medial meniscus, left knee, insufficency fracture medial tibial plateau 04/07/2017  . Closed nondisplaced fracture of lateral condyle of left tibia with routine healing 04/07/2017  . Status post arthroscopy of left knee 04/07/2017  . Insomnia 09/14/2015  . Hyperlipidemia with target LDL less than 100 09/11/2014  . Rotator cuff tendinitis 04/28/2014  . Obesity (BMI  30-39.9) 12/29/2013  . 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  . Exertional shortness of breath 06/26/2012  . Polyp of colon, adenomatous 04/27/2012  . Elevated PSA 10/20/2011  . Hypertriglyceridemia 07/08/2011  . Nonischemic cardiomyopathy ([place]) 09/04/2009   Past Medical History:  Diagnosis Date  . Arthritis   . BPH (benign prostatic hypertrophy)   . Coronary atherosclerosis of native coronary vessel    CATH 2004--  MINIMAL NONOBSTRUCTIVE LAD/  NORMAL EF/    CARDIAC CT  03/2008  NO SIGNIFICANT DISEASE  AND  nlef  . Elevated PSA   . Eye problem 03/06/2016   [name] retinal detachment left eye, no sx done  . History of gout    pt 05-20-2013 states stable   . History of melanoma excision    2011-- LEFT UPPER BACK  . Hyperlipemia   . Hypertriglyceridemia   . Melanoma ([place]) 2011   back  . Nonischemic cardiomyopathy ([place])    09-04-2009  --  2D echo-EF 40-45%, Global Hypokinesis  . Numbness    hands  . Personal history of arthritis   . Personal history of other diseases of circulatory system    Past Surgical History:  Procedure Laterality Date  . APPENDECTOMY  AS CHILD  . CARDIAC CATHETERIZATION  05/23/2002  &  05-23-1998   DR AL LITTLE   minimal irregularities in the LAD/  EF 50%  . CARDIOVASCULAR STRESS TEST  10-05-1998   MILD GLOBAL HYPOKINESIS AND ISCHEMIAIN ANTEROSEPTAL  AT [facility]/ EF 42%  .  EXCISION MELANOMA LEFT UPPER BACK  10-14-2009  . eye lid surgery Bilateral 2018  . KNEE ARTHROSCOPY WITH SUBCHONDROPLASTY Left 04/07/2017   Procedure: LEFT KNEE ARTHROSCOPY WITH PARTIAL MEDIAL MENISCECTOMY AND MEDIAL TIBIAL SUBCHONDROPLASTY;  Surgeon: Mcarthur Rossetti, MD;  Location: [facility];  Service: Orthopedics;  Laterality: Left;  . knee injections Bilateral   . LUMBAR DISC SURGERY  09-12-2000   left  L5 -- S1  . PROSTATE BIOPSY N/A 05/22/2013   Procedure: BIOPSY TRANSRECTAL ULTRASONIC PROSTATE (TUBP);  Surgeon: [name], MD;  Location: [facility];   Service: Urology;  Laterality: N/A;  . ROTATOR CUFF REPAIR Right 2016  . TRANSRECTAL ULTRASOUND PROSTATE BX  05-23-2005  &  04-19-2001  . TRANSTHORACIC ECHOCARDIOGRAM  09-04-2009   MILD LVH/  MILD GLOBAL HYPOKINESIS/ LVEF 40-45%/  MILD AV SCLEROSIS WITHOUT STENOSIS/  MILD PVR   Allergies  Allergen Reactions  . Nitroglycerin Anaphylaxis    "Cardiologist suggested he shouldn't take this med because it could kill him with his heart condition. Lowers BP"  . Ace Inhibitors Swelling  . Lovastatin Nausea Only  . Solarcaine [Benzocaine] Dermatitis   Prior to Admission medications   Medication Sig Start Date End Date Taking? Authorizing Provider  aspirin EC 81 MG tablet Take 81 mg by mouth daily.   Yes [provider]  DULoxetine (CYMBALTA) 60 MG capsule Take 1 capsule (60 mg total) by mouth daily. 01/10/18  Yes Marcial Pacas, MD  fish oil-omega-3 fatty acids 1000 MG capsule Take 1 g by mouth daily.   Yes [provider]  gabapentin (NEURONTIN) 300 MG capsule Take 1-2 capsules (300-600 mg total) by mouth 2 (two) times daily as needed. 04/06/17  Yes [name], MD  Garlic TABS Take 1 tablet by mouth daily.   Yes [provider]  Multiple Vitamins-Minerals (MULTIVITAMIN PO) Take 1 tablet by mouth daily.   Yes [provider]  polyvinyl alcohol (LIQUIFILM TEARS) 1.4 % ophthalmic solution Place 1 drop into both eyes daily as needed for dry eyes.   Yes [provider]  rosuvastatin (CRESTOR) 10 MG tablet Take 1 tablet (10 mg total) by mouth daily. 02/27/18  Yes [name], MD  sildenafil (VIAGRA) 100 MG tablet 1/2 to 1 tablet up to each day as needed. Patient taking differently: Take 50-100 mg by mouth daily as needed for erectile dysfunction.  07/08/11  Yes [name], MD  Turmeric Curcumin 500 MG CAPS Take 1 tablet by mouth daily.   Yes [provider]  ALPRAZolam Duanne Moron) 1 MG tablet Take 1 tablet (1 mg total) by mouth at bedtime as  needed (for MRI). Patient not taking: Reported on 03/26/2018 01/10/18   Marcial Pacas, MD   Social History   Socioeconomic History  . Marital status: Widowed    Spouse name: Not on file  . Number of children: 2  . Years of education: 59  . Highest education level: High school graduate  Occupational History  . 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  Social Needs  . Financial resource strain: Not on file  . Food insecurity:    Worry: Not on file    Inability: Not on file  . Transportation needs:    Medical: Not on file    Non-medical: Not on file  Tobacco Use  . Smoking status: Former Smoker    Packs/day: 2.00    Years: 20.00    Pack years: 40.00  Types: Cigarettes    Last attempt to quit: 03/28/1980    Years since quitting: 38.0  . Smokeless tobacco: Never Used  Substance and Sexual Activity  . Alcohol use: No    Alcohol/week: 0.0 standard drinks  . Drug use: No  . Sexual activity: Not on file  Lifestyle  . Physical activity:    Days per week: Not on file    Minutes per session: Not on file  . Stress: Not on file  Relationships  . Social connections:    Talks on phone: Not on file    Gets together: Not on file    Attends religious service: Not on file    Active member of club or organization: Not on file    Attends meetings of clubs or organizations: Not on file    Relationship status: Not on file  . Intimate partner violence:    Fear of current or ex partner: Not on file    Emotionally abused: Not on file    Physically abused: Not on file    Forced sexual activity: Not on file  Other Topics Concern  . Not on file  Social History Narrative   He walks on a relatively regular basis about 15 minutes at a time mostly to the discomfort. He previously had walked up a 30-40 minutes a time without problems. Education: [employer].   Lives alone.   Right-handed.   One cup coffee daily.   Review of Systems  Constitutional:  Negative for fever.  Respiratory: Negative for cough, chest tightness and shortness of breath.   Cardiovascular: Negative for chest pain.  Gastrointestinal: Negative for blood in stool.  Genitourinary: Negative for hematuria.      Objective:   Physical Exam Constitutional:      Appearance: Normal appearance.  HENT:     Head: Normocephalic and atraumatic.     Mouth/Throat:     Mouth: Mucous membranes are moist.     Pharynx: No oropharyngeal exudate or posterior oropharyngeal erythema.     Comments: No loose or missing teeth. Cardiovascular:     Rate and Rhythm: Normal rate and regular rhythm.     Heart sounds: Normal heart sounds.  Pulmonary:     Effort: Pulmonary effort is normal.     Breath sounds: Normal breath sounds.  Musculoskeletal:     Right lower leg: No edema.     Left lower leg: No edema.  Neurological:     Mental Status: He is alert.  Psychiatric:        Mood and Affect: Mood normal.        Behavior: Behavior normal.        Thought Content: Thought content normal.        Judgment: Judgment normal.    Vitals:   03/26/18 1616  BP: 120/64  Pulse: 95  Temp: 97.8 F (36.6 C)  TempSrc: Oral  SpO2: 95%  Weight: 200 lb 9.6 oz (91 kg)  Height: 5\' 10"  (1.778 m)      Assessment & Plan:  [name] [name] is a 72 y.o. male Preoperative evaluation to rule out surgical contraindication - Plan: Basic metabolic panel, CBC, Protime-INR  Prediabetes - Plan: Hemoglobin A1c  Screening, anemia, deficiency, iron - Plan: CBC  Aspirin long-term use - Plan: Protime-INR  Preoperative medical clearance without apparent surgical contraindication from a medical standpoint.  He has been cleared from a cardiac standpoint from cardiology with notation of acceptable risk on their note, as well as advice  on holding aspirin.  -Check CBC, BMP, PT/INR.  Previous prediabetes we will also check A1c.  Plan for chest x-ray as ordered prior to surgery.  Clearance paperwork will be  completed once results reviewed  No orders of the defined types were placed in this encounter.  Patient Instructions    I do not see any increased risk of surgery from a medical standpoint at this time.  I will check some blood work including kidney function, prediabetes test, and blood counts.  It does appear that and a chest x-ray has been ordered prior to your surgery.  Once I have results of the tests I can fill out paperwork further.  Thank you for coming in today.    If you have lab work done today you will be contacted with your lab results within the next 2 weeks.  If you have not heard from [place] then please contact us. The fastest way to get your results is to register for My Chart.   IF you received an x-ray today, you will receive an invoice from [facility]. Please contact [facility] at [phone] with questions or concerns regarding your invoice.   IF you received labwork today, you will receive an invoice from [facility]. Please contact [facility] at [phone] with questions or concerns regarding your invoice.   Our billing staff will not be able to assist you with questions regarding bills from these companies.  You will be contacted with the lab results as soon as they are available. The fastest way to get your results is to activate your My Chart account. Instructions are located on the last page of this paperwork. If you have not heard from [place] regarding the results in 2 weeks, please contact this office.       I personally performed the services described in this documentation, which was scribed in my presence. The recorded information has been reviewed and considered for accuracy and completeness, addended by me as needed, and agree with information above.  Signed,   [name], MD [facility] [place].  03/28/18 12:51 PM

## 2018-03-26 NOTE — Patient Instructions (Addendum)
  I do not see any increased risk of surgery from a medical standpoint at this time.  I will check some blood work including kidney function, prediabetes test, and blood counts.  It does appear that and a chest x-ray has been ordered prior to your surgery.  Once I have results of the tests I can fill out paperwork further.  Thank you for coming in today.    If you have lab work done today you will be contacted with your lab results within the next 2 weeks.  If you have not heard from [place] then please contact us. The fastest way to get your results is to register for My Chart.   IF you received an x-ray today, you will receive an invoice from [facility]. Please contact [facility] at [phone] with questions or concerns regarding your invoice.   IF you received labwork today, you will receive an invoice from [facility]. Please contact [facility] at [phone] with questions or concerns regarding your invoice.   Our billing staff will not be able to assist you with questions regarding bills from these companies.  You will be contacted with the lab results as soon as they are available. The fastest way to get your results is to activate your My Chart account. Instructions are located on the last page of this paperwork. If you have not heard from [place] regarding the results in 2 weeks, please contact this office.

## 2018-03-27 LAB — BASIC METABOLIC PANEL
BUN/Creatinine Ratio: 13 (ref 10–24)
BUN: 15 mg/dL (ref 8–27)
CO2: 22 mmol/L (ref 20–29)
Calcium: 9.7 mg/dL (ref 8.6–10.2)
Chloride: 103 mmol/L (ref 96–106)
Creatinine, Ser: 1.17 mg/dL (ref 0.76–1.27)
GFR calc Af Amer: 71 mL/min/{1.73_m2} (ref >59)
GFR calc non Af Amer: 61 mL/min/{1.73_m2} (ref >59)
Glucose: 94 mg/dL (ref 65–99)
Potassium: 4.6 mmol/L (ref 3.5–5.2)
Sodium: 142 mmol/L (ref 134–144)

## 2018-03-27 LAB — HEMOGLOBIN A1C
Est. average glucose Bld gHb Est-mCnc: 131 mg/dL
Hgb A1c MFr Bld: 6.2 % — ABNORMAL HIGH (ref 4.8–5.6)

## 2018-03-27 LAB — CBC
Hematocrit: 39.6 % (ref 37.5–51.0)
Hemoglobin: 13.2 g/dL (ref 13.0–17.7)
MCH: 29.5 pg (ref 26.6–33.0)
MCHC: 33.3 g/dL (ref 31.5–35.7)
MCV: 88 fL (ref 79–97)
Platelets: 187 10*3/uL (ref 150–450)
RBC: 4.48 x10E6/uL (ref 4.14–5.80)
RDW: 13 % (ref 12.3–15.4)
WBC: 10.4 10*3/uL (ref 3.4–10.8)

## 2018-03-27 LAB — PROTIME-INR
INR: 1 (ref 0.8–1.2)
Prothrombin Time: 10.4 s (ref 9.1–12.0)

## 2018-03-28 ENCOUNTER — Encounter: Payer: Self-pay | Admitting: Family Medicine

## 2018-04-02 ENCOUNTER — Ambulatory Visit (INDEPENDENT_AMBULATORY_CARE_PROVIDER_SITE_OTHER): Payer: Medicare Other

## 2018-04-02 ENCOUNTER — Other Ambulatory Visit: Payer: Self-pay

## 2018-04-02 ENCOUNTER — Encounter: Payer: Self-pay | Admitting: Family Medicine

## 2018-04-02 ENCOUNTER — Ambulatory Visit (INDEPENDENT_AMBULATORY_CARE_PROVIDER_SITE_OTHER): Payer: Medicare Other | Admitting: Family Medicine

## 2018-04-02 VITALS — BP 126/63 | HR 93 | Temp 97.8°F | Resp 14 | Ht 70.0 in | Wt 200.6 lb

## 2018-04-02 DIAGNOSIS — R0789 Other chest pain: Secondary | ICD-10-CM | POA: Diagnosis not present

## 2018-04-02 DIAGNOSIS — R0781 Pleurodynia: Secondary | ICD-10-CM

## 2018-04-02 DIAGNOSIS — S299XXA Unspecified injury of thorax, initial encounter: Secondary | ICD-10-CM | POA: Diagnosis not present

## 2018-04-02 MED ORDER — HYDROCODONE-ACETAMINOPHEN 5-325 MG PO TABS: 1.0000 | ORAL_TABLET | Freq: Four times a day (QID) | ORAL | 0 refills | Status: DC | PRN

## 2018-04-02 NOTE — Progress Notes (Signed)
Subjective:    Patient ID: [name] [name], male    DOB: [birth year], 73 y.o.   MRN: [id number]  HPI [name] [name] is a 73 y.o. male Presents today for: Chief Complaint  Patient presents with  . Fall    injury to the left side of chest/rib area. hurts when take a deep breath or when make certain moves DOI was 03/30/2017   Doing some work at home. Pulled a limb that turned loose, sling shot effect and fell onto front of chest with hand in between chest and ground. DOI - 3 days ago.  No dyspnea, sore with certain mvmts only - not sore at rest.  Able to do activities and more outside work next day.  More sore overnight - hard to get comfortable. No new cough. No dyspnea today.  Tx: none.   No new hand or wrist pain.   Patient Active Problem List   Diagnosis Date Noted  . Paresthesia 01/10/2018  . Neck pain 01/10/2018  . LBBB (left bundle branch block) 09/07/2017  . Other meniscus derangements, posterior horn of medial meniscus, left knee, insufficency fracture medial tibial plateau 04/07/2017  . Closed nondisplaced fracture of lateral condyle of left tibia with routine healing 04/07/2017  . Status post arthroscopy of left knee 04/07/2017  . Insomnia 09/14/2015  . Hyperlipidemia with target LDL less than 100 09/11/2014  . Rotator cuff tendinitis 04/28/2014  . Obesity (BMI 30-39.9) 12/29/2013  . 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  . Exertional shortness of breath 06/26/2012  . Polyp of colon, adenomatous 04/27/2012  . Elevated PSA 10/20/2011  . Hypertriglyceridemia 07/08/2011  . Nonischemic cardiomyopathy ([place]) 09/04/2009   Past Medical History:  Diagnosis Date  . Arthritis   . BPH (benign prostatic hypertrophy)   . Coronary atherosclerosis of native coronary vessel    CATH 2004--  MINIMAL NONOBSTRUCTIVE LAD/  NORMAL EF/    CARDIAC CT  03/2008  NO SIGNIFICANT DISEASE  AND  nlef  . Elevated PSA   . Eye problem 03/06/2016   [name] retinal detachment left  eye, no sx done  . History of gout    pt 05-20-2013 states stable   . History of melanoma excision    2011-- LEFT UPPER BACK  . Hyperlipemia   . Hypertriglyceridemia   . Melanoma ([place]) 2011   back  . Nonischemic cardiomyopathy ([place])    09-04-2009  --  2D echo-EF 40-45%, Global Hypokinesis  . Numbness    hands  . Personal history of arthritis   . Personal history of other diseases of circulatory system    Past Surgical History:  Procedure Laterality Date  . APPENDECTOMY  AS CHILD  . CARDIAC CATHETERIZATION  05/23/2002  &  05-23-1998   DR AL LITTLE   minimal irregularities in the LAD/  EF 50%  . CARDIOVASCULAR STRESS TEST  10-05-1998   MILD GLOBAL HYPOKINESIS AND ISCHEMIAIN ANTEROSEPTAL  AT [facility]/ EF 42%  . EXCISION MELANOMA LEFT UPPER BACK  10-14-2009  . eye lid surgery Bilateral 2018  . KNEE ARTHROSCOPY WITH SUBCHONDROPLASTY Left 04/07/2017   Procedure: LEFT KNEE ARTHROSCOPY WITH PARTIAL MEDIAL MENISCECTOMY AND MEDIAL TIBIAL SUBCHONDROPLASTY;  Surgeon: Mcarthur Rossetti, MD;  Location: [facility];  Service: Orthopedics;  Laterality: Left;  . knee injections Bilateral   . LUMBAR DISC SURGERY  09-12-2000   left  L5 -- S1  . PROSTATE BIOPSY N/A 05/22/2013   Procedure: BIOPSY TRANSRECTAL ULTRASONIC PROSTATE (TUBP);  Surgeon: [name], MD;  Location: [facility];  Service: Urology;  Laterality: N/A;  . ROTATOR CUFF REPAIR Right 2016  . TRANSRECTAL ULTRASOUND PROSTATE BX  05-23-2005  &  04-19-2001  . TRANSTHORACIC ECHOCARDIOGRAM  09-04-2009   MILD LVH/  MILD GLOBAL HYPOKINESIS/ LVEF 40-45%/  MILD AV SCLEROSIS WITHOUT STENOSIS/  MILD PVR   Allergies  Allergen Reactions  . Nitroglycerin Anaphylaxis    "Cardiologist suggested he shouldn't take this med because it could kill him with his heart condition. Lowers BP"  . Ace Inhibitors Swelling  . Lovastatin Nausea Only  . Solarcaine [Benzocaine] Dermatitis   Prior to Admission medications   Medication Sig Start  Date End Date Taking? Authorizing Provider  ALPRAZolam Duanne Moron) 1 MG tablet Take 1 tablet (1 mg total) by mouth at bedtime as needed (for MRI). 01/10/18  Yes Marcial Pacas, MD  aspirin EC 81 MG tablet Take 81 mg by mouth daily.   Yes [provider]  DULoxetine (CYMBALTA) 60 MG capsule Take 1 capsule (60 mg total) by mouth daily. 01/10/18  Yes Marcial Pacas, MD  fish oil-omega-3 fatty acids 1000 MG capsule Take 1 g by mouth daily.   Yes [provider]  gabapentin (NEURONTIN) 300 MG capsule Take 1-2 capsules (300-600 mg total) by mouth 2 (two) times daily as needed. 04/06/17  Yes [name], MD  Garlic TABS Take 1 tablet by mouth daily.   Yes [provider]  Multiple Vitamins-Minerals (MULTIVITAMIN PO) Take 1 tablet by mouth daily.   Yes [provider]  polyvinyl alcohol (LIQUIFILM TEARS) 1.4 % ophthalmic solution Place 1 drop into both eyes daily as needed for dry eyes.   Yes [provider]  rosuvastatin (CRESTOR) 10 MG tablet Take 1 tablet (10 mg total) by mouth daily. 02/27/18  Yes [name], MD  sildenafil (VIAGRA) 100 MG tablet 1/2 to 1 tablet up to each day as needed. Patient taking differently: Take 50-100 mg by mouth daily as needed for erectile dysfunction.  07/08/11  Yes [name], MD  Turmeric Curcumin 500 MG CAPS Take 1 tablet by mouth daily.   Yes [provider]   Social History   Socioeconomic History  . Marital status: Widowed    Spouse name: Not on file  . Number of children: 2  . Years of education: 50  . Highest education level: High school graduate  Occupational History  . 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  Social Needs  . Financial resource strain: Not on file  . Food insecurity:    Worry: Not on file    Inability: Not on file  . Transportation needs:    Medical: Not on file    Non-medical: Not on file  Tobacco Use  . Smoking status: Former Smoker      Packs/day: 2.00    Years: 20.00    Pack years: 40.00    Types: Cigarettes    Last attempt to quit: 03/28/1980    Years since quitting: 38.0  . Smokeless tobacco: Never Used  Substance and Sexual Activity  . Alcohol use: No    Alcohol/week: 0.0 standard drinks  . Drug use: No  . Sexual activity: Not on file  Lifestyle  . Physical activity:    Days per week: Not on file    Minutes per session: Not on file  . Stress: Not on file  Relationships  . Social connections:    Talks on phone: Not on file  Gets together: Not on file    Attends religious service: Not on file    Active member of club or organization: Not on file    Attends meetings of clubs or organizations: Not on file    Relationship status: Not on file  . Intimate partner violence:    Fear of current or ex partner: Not on file    Emotionally abused: Not on file    Physically abused: Not on file    Forced sexual activity: Not on file  Other Topics Concern  . Not on file  Social History Narrative   He walks on a relatively regular basis about 15 minutes at a time mostly to the discomfort. He previously had walked up a 30-40 minutes a time without problems. Education: [employer].   Lives alone.   Right-handed.   One cup coffee daily.    Review of Systems Per HPI.     Objective:   Physical Exam Constitutional:      General: He is not in acute distress.    Appearance: He is well-developed.  HENT:     Head: Normocephalic and atraumatic.  Cardiovascular:     Rate and Rhythm: Normal rate.  Pulmonary:     Effort: Pulmonary effort is normal.     Breath sounds: Normal breath sounds. No stridor. No rhonchi.  Chest:     Chest wall: Tenderness (ttp left anterior ribs. no crepitus, no sub q emphysema or wound. ) present.  Neurological:     Mental Status: He is alert and oriented to person, place, and time.    Vitals:   04/02/18 1207  BP: 126/63  Pulse: 93  Resp: 14  Temp: 97.8 F (36.6 C)  TempSrc: Oral   SpO2: 96%  Weight: 200 lb 9.6 oz (91 kg)  Height: 5\' 10"  (1.778 m)    Dg Ribs Unilateral Left  Result Date: 04/02/2018 CLINICAL DATA:  Rib pain.  Fall EXAM: LEFT RIBS - 2 VIEW COMPARISON:  Chest x-ray 12/27/2012 FINDINGS: Heart and mediastinal contours are within normal limits. No focal opacities or effusions. No acute bony abnormality. No visible rib fracture. No pneumothorax. IMPRESSION: No active cardiopulmonary disease. Electronically Signed   By: Rolm Baptise M.D.   On: 04/02/2018 12:31      Assessment & Plan:    [name] [name] is a 73 y.o. male Rib pain - Plan: DG Ribs Unilateral Left  Chest wall pain  X-ray reassuring, but based on exam I am suspicious of a nondisplaced rib fracture.  Symptomatic care discussed, hydrocodone given if needed for pain but side effects and risks were discussed as well as avoidance of benzodiazepines while taking narcotics.  Handout given, incentive spirometry discussed, and RTC/ER precautions given.  Meds ordered this encounter  Medications  . HYDROcodone-acetaminophen (NORCO/VICODIN) 5-325 MG tablet    Sig: Take 1-2 tablets by mouth every 6 (six) hours as needed for moderate pain.    Dispense:  30 tablet    Refill:  0   Patient Instructions   I am suspicious of a rib fracture even though one was not seen on your x-ray.  See information below.  Can try 1 hydrocodone every 6 hours, maximum of 2 at a time only if needed.  Watch for sedation, lightheadedness or dizziness on that medication.  Do not use Xanax while you are taking hydrocodone.  Try to take a deep breath a few times per hour to lessen chance of fluid collecting in your lungs.  Recheck  in the next 1 week if not improving, sooner if worse.   Return to the clinic or go to the nearest emergency room if any of your symptoms worsen or new symptoms occur.   Rib Fracture  A rib fracture is a break or crack in one of the bones of the ribs. The ribs are long, curved bones that wrap  around your chest and attach to your spine and your breastbone. The ribs protect your heart, lungs, and other organs in the chest. A broken or cracked rib is often painful but is not usually serious. Most rib fractures heal on their own over time. However, rib fractures can be more serious if multiple ribs are broken or if broken ribs move out of place and push against other structures or organs. What are the causes? This condition is caused by:  Repetitive movements with high force, such as pitching a baseball or having severe coughing spells.  A direct blow to the chest, such as a sports injury, a car accident, or a fall.  Cancer that has spread to the bones, which can weaken bones and cause them to break. What are the signs or symptoms? Symptoms of this condition include:  Pain when you breathe in or cough.  Pain when someone presses on the injured area.  Feeling short of breath. How is this diagnosed? This condition is diagnosed with a physical exam and medical history. Imaging tests may also be done, such as:  Chest X-ray.  CT scan.  MRI.  Bone scan.  Chest ultrasound. How is this treated? Treatment for this condition depends on the severity of the fracture. Most rib fractures usually heal on their own in 1-3 months. Sometimes healing takes longer if there is a cough that does not stop or if there are other activities that make the injury worse (aggravating factors). While you heal, you will be given medicines to control the pain. You will also be taught deep breathing exercises. Severe injuries may require hospitalization or surgery. Follow these instructions at home: Managing pain, stiffness, and swelling  If directed, apply ice to the injured area. ? Put ice in a plastic bag. ? Place a towel between your skin and the bag. ? Leave the ice on for 20 minutes, 2-3 times a day.  Take over-the-counter and prescription medicines only as told by your health care  provider. Activity  Avoid a lot of activity and any activities or movements that cause pain. Be careful during activities and avoid bumping the injured rib.  Slowly increase your activity as told by your health care provider. General instructions  Do deep breathing exercises as told by your health care provider. This helps prevent pneumonia, which is a common complication of a broken rib. Your health care provider may instruct you to: ? Take deep breaths several times a day. ? Try to cough several times a day, holding a pillow against the injured area. ? Use a device called incentive spirometer to practice deep breathing several times a day.  Drink enough fluid to keep your urine pale yellow.  Do not wear a rib belt or binder. These restrict breathing, which can lead to pneumonia.  Keep all follow-up visits as told by your health care provider. This is important. Contact a health care provider if:  You have a fever. Get help right away if:  You have difficulty breathing or you are short of breath.  You develop a cough that does not stop, or you cough up  thick or bloody sputum.  You have nausea, vomiting, or pain in your abdomen.  Your pain gets worse and medicine does not help. Summary  A rib fracture is a break or crack in one of the bones of the ribs.  A broken or cracked rib is often painful but is not usually serious.  Most rib fractures heal on their own over time.  Treatment for this condition depends on the severity of the fracture.  Avoid a lot of activity and any activities or movements that cause pain. This information is not intended to replace advice given to you by your health care provider. Make sure you discuss any questions you have with your health care provider. Document Released: 03/14/2005 Document Revised: 06/13/2016 Document Reviewed: 06/13/2016 Elsevier Interactive Patient Education  [employer].   If you have lab work done today you will be  contacted with your lab results within the next 2 weeks.  If you have not heard from [place] then please contact us. The fastest way to get your results is to register for My Chart.   IF you received an x-ray today, you will receive an invoice from [facility]. Please contact [facility] at [phone] with questions or concerns regarding your invoice.   IF you received labwork today, you will receive an invoice from [place]. Please contact [facility] at [phone] with questions or concerns regarding your invoice.   Our billing staff will not be able to assist you with questions regarding bills from these companies.  You will be contacted with the lab results as soon as they are available. The fastest way to get your results is to activate your My Chart account. Instructions are located on the last page of this paperwork. If you have not heard from [place] regarding the results in 2 weeks, please contact this office.      Signed,   [name], MD [facility].  04/02/18 12:59 PM

## 2018-04-02 NOTE — Patient Instructions (Addendum)
I am suspicious of a rib fracture even though one was not seen on your x-ray.  See information below.  Can try 1 hydrocodone every 6 hours, maximum of 2 at a time only if needed.  Watch for sedation, lightheadedness or dizziness on that medication.  Do not use Xanax while you are taking hydrocodone.  Try to take a deep breath a few times per hour to lessen chance of fluid collecting in your lungs.  Recheck in the next 1 week if not improving, sooner if worse.   Return to the clinic or go to the nearest emergency room if any of your symptoms worsen or new symptoms occur.   Rib Fracture  A rib fracture is a break or crack in one of the bones of the ribs. The ribs are long, curved bones that wrap around your chest and attach to your spine and your breastbone. The ribs protect your heart, lungs, and other organs in the chest. A broken or cracked rib is often painful but is not usually serious. Most rib fractures heal on their own over time. However, rib fractures can be more serious if multiple ribs are broken or if broken ribs move out of place and push against other structures or organs. What are the causes? This condition is caused by:  Repetitive movements with high force, such as pitching a baseball or having severe coughing spells.  A direct blow to the chest, such as a sports injury, a car accident, or a fall.  Cancer that has spread to the bones, which can weaken bones and cause them to break. What are the signs or symptoms? Symptoms of this condition include:  Pain when you breathe in or cough.  Pain when someone presses on the injured area.  Feeling short of breath. How is this diagnosed? This condition is diagnosed with a physical exam and medical history. Imaging tests may also be done, such as:  Chest X-ray.  CT scan.  MRI.  Bone scan.  Chest ultrasound. How is this treated? Treatment for this condition depends on the severity of the fracture. Most rib fractures usually  heal on their own in 1-3 months. Sometimes healing takes longer if there is a cough that does not stop or if there are other activities that make the injury worse (aggravating factors). While you heal, you will be given medicines to control the pain. You will also be taught deep breathing exercises. Severe injuries may require hospitalization or surgery. Follow these instructions at home: Managing pain, stiffness, and swelling  If directed, apply ice to the injured area. ? Put ice in a plastic bag. ? Place a towel between your skin and the bag. ? Leave the ice on for 20 minutes, 2-3 times a day.  Take over-the-counter and prescription medicines only as told by your health care provider. Activity  Avoid a lot of activity and any activities or movements that cause pain. Be careful during activities and avoid bumping the injured rib.  Slowly increase your activity as told by your health care provider. General instructions  Do deep breathing exercises as told by your health care provider. This helps prevent pneumonia, which is a common complication of a broken rib. Your health care provider may instruct you to: ? Take deep breaths several times a day. ? Try to cough several times a day, holding a pillow against the injured area. ? Use a device called incentive spirometer to practice deep breathing several times a day.  Drink enough  fluid to keep your urine pale yellow.  Do not wear a rib belt or binder. These restrict breathing, which can lead to pneumonia.  Keep all follow-up visits as told by your health care provider. This is important. Contact a health care provider if:  You have a fever. Get help right away if:  You have difficulty breathing or you are short of breath.  You develop a cough that does not stop, or you cough up thick or bloody sputum.  You have nausea, vomiting, or pain in your abdomen.  Your pain gets worse and medicine does not help. Summary  A rib fracture  is a break or crack in one of the bones of the ribs.  A broken or cracked rib is often painful but is not usually serious.  Most rib fractures heal on their own over time.  Treatment for this condition depends on the severity of the fracture.  Avoid a lot of activity and any activities or movements that cause pain. This information is not intended to replace advice given to you by your health care provider. Make sure you discuss any questions you have with your health care provider. Document Released: 03/14/2005 Document Revised: 06/13/2016 Document Reviewed: 06/13/2016 Elsevier Interactive Patient Education  [employer].   If you have lab work done today you will be contacted with your lab results within the next 2 weeks.  If you have not heard from [place] then please contact us. The fastest way to get your results is to register for My Chart.   IF you received an x-ray today, you will receive an invoice from [facility] Radiology. Please contact [facility]iology at [phone] with questions or concerns regarding your invoice.   IF you received labwork today, you will receive an invoice from [place]. Please contact [facility] at [phone] with questions or concerns regarding your invoice.   Our billing staff will not be able to assist you with questions regarding bills from these companies.  You will be contacted with the lab results as soon as they are available. The fastest way to get your results is to activate your My Chart account. Instructions are located on the last page of this paperwork. If you have not heard from [place] regarding the results in 2 weeks, please contact this office.

## 2018-04-11 NOTE — Pre-Procedure Instructions (Signed)
Tamas Suen  04/11/2018      Your procedure is scheduled on April 20, 2018.  Report to [facility] Admitting at 08:15 A.M.  Call this number if you have problems the morning of surgery:  [phone]   Remember:  Do not eat or drink after midnight.    Take these medicines the morning of surgery with A SIP OF WATER : Hydrocodone-acetaminophen (Norco) if needed Gabapentin (Neurontin) if needed Eye drops if needed  Ask your Doctor regarding when to STOP/continue your Aspirin.  7 days prior to surgery STOP taking any Aspirin (unless otherwise instructed by your surgeon), Aleve, Naproxen, Ibuprofen, Motrin, Advil, Goody's, BC's, all herbal medications, fish oil, and all vitamins.    Do not wear jewelry.  Do not wear lotions, powders, or cologne, or deodorant.  Do not shave 48 hours prior to surgery.  Men may shave face and neck.  Do not bring valuables to the hospital.  [facility] is not responsible for any belongings or valuables.  Contacts, dentures or bridgework may not be worn into surgery.  Leave your suitcase in the car.  After surgery it may be brought to your room.  For patients admitted to the hospital, discharge time will be determined by your treatment team.  Patients discharged the day of surgery will not be allowed to drive home.    Special instructions:   [place]- Preparing For Surgery  Before surgery, you can play an important role. Because skin is not sterile, your skin needs to be as free of germs as possible. You can reduce the number of germs on your skin by washing with CHG (chlorahexidine gluconate) Soap before surgery.  CHG is an antiseptic cleaner which kills germs and bonds with the skin to continue killing germs even after washing.    Oral Hygiene is also important to reduce your risk of infection.  Remember - BRUSH YOUR TEETH THE MORNING OF SURGERY WITH YOUR REGULAR TOOTHPASTE  Please do not use if you have an allergy to CHG  or antibacterial soaps. If your skin becomes reddened/irritated stop using the CHG.  Do not shave (including legs and underarms) for at least 48 hours prior to first CHG shower. It is OK to shave your face.  Please follow these instructions carefully.   1. Shower the NIGHT BEFORE SURGERY and the MORNING OF SURGERY with CHG.   2. If you chose to wash your hair, wash your hair first as usual with your normal shampoo.  3. After you shampoo, rinse your hair and body thoroughly to remove the shampoo.  4. Use CHG as you would any other liquid soap. You can apply CHG directly to the skin and wash gently with a scrungie or a clean washcloth.   5. Apply the CHG Soap to your body ONLY FROM THE NECK DOWN.  Do not use on open wounds or open sores. Avoid contact with your eyes, ears, mouth and genitals (private parts). Wash Face and genitals (private parts)  with your normal soap.  6. Wash thoroughly, paying special attention to the area where your surgery will be performed.  7. Thoroughly rinse your body with warm water from the neck down.  8. DO NOT shower/wash with your normal soap after using and rinsing off the CHG Soap.  9. Pat yourself dry with a CLEAN TOWEL.  10. Wear CLEAN PAJAMAS to bed the night before surgery, wear comfortable clothes the morning of surgery  11. Place CLEAN SHEETS  on your bed the night of your first shower and DO NOT SLEEP WITH PETS.    Day of Surgery:  Do not apply any deodorants/lotions.  Please wear clean clothes to the hospital/surgery center.   Remember to brush your teeth WITH YOUR REGULAR TOOTHPASTE.    Please read over the following fact sheets that you were given.

## 2018-04-12 ENCOUNTER — Other Ambulatory Visit: Payer: Self-pay

## 2018-04-12 ENCOUNTER — Ambulatory Visit (HOSPITAL_COMMUNITY)
Admission: RE | Admit: 2018-04-12 | Discharge: 2018-04-12 | Disposition: A | Discharge status: Home / Self Care | Payer: Medicare Other | Source: Ambulatory Visit | Attending: Neurological Surgery | Admitting: Neurological Surgery

## 2018-04-12 ENCOUNTER — Encounter (HOSPITAL_COMMUNITY): Payer: Self-pay

## 2018-04-12 ENCOUNTER — Encounter (HOSPITAL_COMMUNITY)
Admission: RE | Admit: 2018-04-12 | Discharge: 2018-04-12 | Disposition: A | Discharge status: Home / Self Care | Payer: Medicare Other | Source: Ambulatory Visit | Attending: Neurological Surgery | Admitting: Neurological Surgery

## 2018-04-12 DIAGNOSIS — Z01818 Encounter for other preprocedural examination: Secondary | ICD-10-CM | POA: Diagnosis not present

## 2018-04-12 DIAGNOSIS — M4802 Spinal stenosis, cervical region: Secondary | ICD-10-CM

## 2018-04-12 HISTORY — DX: Left bundle-branch block, unspecified: I44.7

## 2018-04-12 LAB — BASIC METABOLIC PANEL
Anion gap: 9 (ref 5–15)
BUN: 16 mg/dL (ref 8–23)
CO2: 24 mmol/L (ref 22–32)
Calcium: 9.4 mg/dL (ref 8.9–10.3)
Chloride: 105 mmol/L (ref 98–111)
Creatinine, Ser: 1.08 mg/dL (ref 0.61–1.24)
GFR calc Af Amer: >60 mL/min (ref >60)
GFR calc non Af Amer: >60 mL/min (ref >60)
Glucose, Bld: 118 mg/dL — ABNORMAL HIGH (ref 70–99)
Potassium: 4.8 mmol/L (ref 3.5–5.1)
Sodium: 138 mmol/L (ref 135–145)

## 2018-04-12 LAB — CBC WITH DIFFERENTIAL/PLATELET
Abs Immature Granulocytes: 0.02 10*3/uL (ref 0.00–0.07)
Basophils Absolute: 0.1 10*3/uL (ref 0.0–0.1)
Basophils Relative: 1 %
Eosinophils Absolute: 0.2 10*3/uL (ref 0.0–0.5)
Eosinophils Relative: 3 %
HCT: 42.8 % (ref 39.0–52.0)
Hemoglobin: 14 g/dL (ref 13.0–17.0)
Immature Granulocytes: 0 %
Lymphocytes Relative: 41 %
Lymphs Abs: 3.4 10*3/uL (ref 0.7–4.0)
MCH: 29.9 pg (ref 26.0–34.0)
MCHC: 32.7 g/dL (ref 30.0–36.0)
MCV: 91.5 fL (ref 80.0–100.0)
Monocytes Absolute: 0.7 10*3/uL (ref 0.1–1.0)
Monocytes Relative: 8 %
Neutro Abs: 3.9 10*3/uL (ref 1.7–7.7)
Neutrophils Relative %: 47 %
Platelets: 194 10*3/uL (ref 150–400)
RBC: 4.68 MIL/uL (ref 4.22–5.81)
RDW: 13.2 % (ref 11.5–15.5)
WBC: 8.3 10*3/uL (ref 4.0–10.5)
nRBC: 0 % (ref 0.0–0.2)

## 2018-04-12 LAB — PROTIME-INR
INR: 0.95
Prothrombin Time: 12.6 seconds (ref 11.4–15.2)

## 2018-04-12 LAB — SURGICAL PCR SCREEN
MRSA, PCR: NEGATIVE
Staphylococcus aureus: NEGATIVE

## 2018-04-12 NOTE — Progress Notes (Addendum)
PCP Dr. [name]-   Cardiologist - Dr. Glenetta Hew  Last OV 09-09-2017  Chest x-ray -04-12-2018  EKG - 09-07-2017 Stress Test - 2000  ECHO - 10-16-2009 Cardiac Cath -2004   Sleep Study - n/a CPAP - n/a  Fasting Blood Sugar - n/a Checks Blood Sugar n/a_____ times a day  Blood Thinner Instructions:n/a Aspirin Instructions:stop 3-5 days prioe to surgery  Anesthesia review: cardiac history   Patient denies shortness of breath, fever, cough and chest pain at PAT appointment   Patient verbalized understanding of instructions that were given to them at the PAT appointment. Patient was also instructed that they will need to review over the PAT instructions again at home before surgery.

## 2018-04-13 ENCOUNTER — Encounter (HOSPITAL_COMMUNITY): Payer: Self-pay

## 2018-04-13 NOTE — Progress Notes (Signed)
Anesthesia Chart Review:  Case:  [id number] Date/Time:  04/20/18 0958   Procedure:  ACDF - C3-C4 - C4-C5 - C5-C6 (N/A )   Anesthesia type:  General   Pre-op diagnosis:  Stenosis   Location:  [facility] OR ROOM 69 / [facility]   Surgeon:  Eustace Moore, MD      DISCUSSION: Patient is a 73 year old male scheduled for the above procedure.   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   He was seen by cardiologist Dr. Ellyn Hack on 09/07/17. He had a new LBBB on EKG. Echo was ordered and showed stable EF at 40-45%. No additional testing ordered. Preoperative cardiology risk assessment per Lyda Jester, PA-C on 03/16/18, "[name] [name] has done well. No anginal symptoms. He is able to perform > 4 METs (walk up a flight of stairs and exercises daily) w/o exertional CP or dyspnea.  Therefore, based on ACC/AHA guidelines, the patient would be at acceptable risk for the planned procedure without further cardiovascular testing.  Can hold ASA 7 days prior to surgery if necessary, but resume as soon as safe to do so from a surgical standpoint."  Patient was seen by his PCP [name], MD on 03/26/18. He wrote, "Preoperative medical clearance without apparent surgical contraindication from a medical standpoint.  He has been cleared from a cardiac standpoint from cardiology with notation of acceptable risk on their note, as well as advice on holding aspirin."  If no acute changes then I anticipate that he can proceed as planned.   VS: BP 135/76   Pulse 90   Temp 36.5 C   Resp 18   Ht 5' 9.5" (1.765 m)   Wt 90.4 kg   SpO2 98%   BMI 29.02 kg/m    PROVIDERS: [name], MD is PCP Glenetta Hew, MD is cardiologist   LABS: Labs reviewed: Acceptable for surgery. A1c 6.2 on  03/26/18.  (all labs ordered are listed, but only abnormal results are displayed)  Labs Reviewed  BASIC METABOLIC PANEL - Abnormal; Notable for the following components:      Result Value   Glucose, Bld 118 (*)    All other components within normal limits  SURGICAL PCR SCREEN  CBC WITH DIFFERENTIAL/PLATELET  PROTIME-INR    IMAGES: CXR 04/12/18: IMPRESSION: No active cardiopulmonary disease.   EKG: 09/07/17: NSR, left BBB. Left BBB new since 09/27/16.   CV: Echo 10/16/17: Study Conclusions - Left ventricle: The cavity size was normal. Wall thickness was   increased in a pattern of mild LVH. Systolic function was mildly   reduced. The estimated ejection fraction was in the range of 45%   to 50%. Incoordinate septal motion. Doppler parameters are   consistent with abnormal left ventricular relaxation (grade 1   diastolic dysfunction). The E/e&' ratio is <8, suggsting normal LV   filling pressure. - Aortic valve: Sclerosis without stenosis. There was no   regurgitation. - Left atrium: The atrium was normal in size. - Inferior vena cava: The vessel was normal in size. The   respirophasic diameter changes were in the normal range (>= 50%),   consistent with normal central venous pressure. Impressions: - LVEF 45-50%, mild LVH, incoordinate septal motion, grade 1 DD,   normal LV filling pressure, normal LA size, normal IVC. (Comparison EF 40-45% 09/04/09)  Coronary CTA with Calcium score 03/31/08: FINAL IMPRESSION:  Left dominant coronary arterial system with no significant coronary artery disease. No significant soft plaque/non-calcified plaque noted. Coronary calcium score is estimated at 0. There is minimal calcific plaquing noted in the aortic root, arch, and descending aorta. No other abnormalities noted and ejection fraction estimated 50 to 55% with borderline, depressed, global hypokinesis with normal LV thickness.   Cardiac cath 05/23/02: CORONARY ARTERIOGRAPHY:  There was no  calcification seen on fluoroscopy.  1. Left main:  There appeared to be a very short left main or a common     ostium for both the LAD and circumflex.  2. LAD:  The LAD extended down to the apex.  The heart gave rise to three     diagonal branches.  There was TIMI-2 flow down this entire vessel. There     was no obvious intimal flap noted and there was only minor irregularity     at the third diagonal of less than 20%.  Intracoronary nitroglycerin     resulted in a slight increase in flow down the vessel but it never became     TIMI-3 flow.  3. Circumflex:  The circumflex was a large vessel that gave rise to an OM     and the PDA, all of which were free of disease.  4. Right coronary artery:  Small nondominant vessel with TIMI-2 flow also. CONCLUSION:  1. Minimal irregularities in the left anterior descending.  2. Normal left ventricular systolic function at 25% (improvement from 2000     catheterization).   Past Medical History:  Diagnosis Date  . Arthritis   . BPH (benign prostatic hypertrophy)   . Coronary atherosclerosis of native coronary vessel    CATH 2004--  MINIMAL NONOBSTRUCTIVE LAD/  NORMAL EF/    CARDIAC CT  03/2008  NO SIGNIFICANT DISEASE  AND  nlef  . Elevated PSA   . Eye problem 03/06/2016   [name] retinal detachment left eye, no sx done  . History of gout    pt 05-20-2013 states stable   . History of melanoma excision    2011-- LEFT UPPER BACK  . Hyperlipemia   . Hypertriglyceridemia   . Melanoma ([place]) 2011   back  . Nonischemic cardiomyopathy ([place])    09-04-2009  --  2D echo-EF 40-45%, Global Hypokinesis  . Numbness    hands  . Personal history of arthritis   . Personal history of other diseases of circulatory system     Past Surgical History:  Procedure Laterality Date  . APPENDECTOMY  AS CHILD  . BACK SURGERY    . CARDIAC CATHETERIZATION  05/23/2002  &  05-23-1998   DR AL LITTLE   minimal irregularities in the LAD/  EF 50%  . CARDIOVASCULAR  STRESS TEST  10-05-1998   MILD GLOBAL HYPOKINESIS AND ISCHEMIAIN ANTEROSEPTAL  AT APEX/ EF 42%  . EXCISION MELANOMA LEFT UPPER BACK  10-14-2009  . eye lid surgery Bilateral 2018  . KNEE ARTHROSCOPY WITH SUBCHONDROPLASTY Left 04/07/2017   Procedure: LEFT KNEE ARTHROSCOPY WITH PARTIAL MEDIAL MENISCECTOMY AND MEDIAL TIBIAL SUBCHONDROPLASTY;  Surgeon: Mcarthur Rossetti, MD;  Location: [facility];  Service: Orthopedics;  Laterality: Left;  . knee injections Bilateral   . LUMBAR DISC SURGERY  09-12-2000   left  L5 -- S1  . PROSTATE BIOPSY N/A 05/22/2013   Procedure: BIOPSY TRANSRECTAL ULTRASONIC PROSTATE (TUBP);  Surgeon: [name], MD;  Location: [facility];  Service: Urology;  Laterality: N/A;  . ROTATOR CUFF REPAIR Right 2016  . TRANSRECTAL ULTRASOUND PROSTATE BX  05-23-2005  &  04-19-2001  . TRANSTHORACIC ECHOCARDIOGRAM  09-04-2009   MILD LVH/  MILD GLOBAL HYPOKINESIS/ LVEF 40-45%/  MILD AV SCLEROSIS WITHOUT STENOSIS/  MILD PVR    MEDICATIONS: . ALPRAZolam (XANAX) 1 MG tablet  . aspirin EC 81 MG tablet  . DULoxetine (CYMBALTA) 60 MG capsule  . gabapentin (NEURONTIN) 300 MG capsule  . Garlic 2505 MG CAPS  . Ginger, Zingiber officinalis, (GINGER ROOT) 550 MG CAPS  . HYDROcodone-acetaminophen (NORCO/VICODIN) 5-325 MG tablet  . magnesium oxide (MAG-OX) 400 MG tablet  . Multiple Vitamin (MULTIVITAMIN WITH MINERALS) TABS tablet  . Omega-3 Fatty Acids (FISH OIL) 500 MG CAPS  . polyvinyl alcohol (LIQUIFILM TEARS) 1.4 % ophthalmic solution  . rosuvastatin (CRESTOR) 10 MG tablet  . sildenafil (VIAGRA) 100 MG tablet  . Turmeric 500 MG TABS  . vitamin B-12 (CYANOCOBALAMIN) 500 MCG tablet  . Zinc 50 MG TABS   No current facility-administered medications for this encounter.   Patient reported being instructed to hold ASA 3-5 days prior to surgery.   Myra Gianotti, PA-C Surgical Short Stay/Anesthesiology [facility] Phone [phone] [facility] Phone [phone] 04/13/2018 5:09  PM

## 2018-04-13 NOTE — Anesthesia Preprocedure Evaluation (Addendum)
Anesthesia Evaluation  Patient identified by MRN, date of birth, ID band Patient awake    Reviewed: Allergy & Precautions, NPO status , Patient's Chart, lab work & pertinent test results  Airway Mallampati: II  TM Distance: >3 FB Neck ROM: Full    Dental no notable dental hx.    Pulmonary former smoker,    Pulmonary exam normal breath sounds clear to auscultation       Cardiovascular + CAD  Normal cardiovascular exam Rhythm:Regular Rate:Normal  LBBB   Neuro/Psych negative neurological ROS  negative psych ROS   GI/Hepatic negative GI ROS, Neg liver ROS,   Endo/Other  negative endocrine ROS  Renal/GU negative Renal ROS  negative genitourinary   Musculoskeletal negative musculoskeletal ROS (+)   Abdominal   Peds negative pediatric ROS (+)  Hematology negative hematology ROS (+)   Anesthesia Other Findings   Reproductive/Obstetrics negative OB ROS                           Anesthesia Physical Anesthesia Plan  ASA: II  Anesthesia Plan: General   Post-op Pain Management:    Induction: Intravenous  PONV Risk Score and Plan: 2 and Ondansetron, Dexamethasone and Treatment may vary due to age or medical condition  Airway Management Planned: Oral ETT  Additional Equipment:   Intra-op Plan:   Post-operative Plan: Extubation in OR  Informed Consent: I have reviewed the patients History and Physical, chart, labs and discussed the procedure including the risks, benefits and alternatives for the proposed anesthesia with the patient or authorized representative who has indicated his/her understanding and acceptance.     Dental advisory given  Plan Discussed with: CRNA  Anesthesia Plan Comments: (PAT note written 04/13/2018 by Myra Gianotti, PA-C. )       Anesthesia Quick Evaluation

## 2018-04-20 ENCOUNTER — Encounter (HOSPITAL_COMMUNITY)
Admission: RE | Disposition: A | Discharge status: Home / Self Care | Payer: Self-pay | Source: Home / Self Care | Attending: Neurological Surgery

## 2018-04-20 ENCOUNTER — Inpatient Hospital Stay (HOSPITAL_COMMUNITY)
Admission: RE | Admit: 2018-04-20 | Discharge: 2018-04-21 | DRG: 472 | Disposition: A | Discharge status: Home / Self Care | Payer: Medicare Other | Attending: Neurological Surgery | Admitting: Neurological Surgery

## 2018-04-20 ENCOUNTER — Other Ambulatory Visit: Payer: Self-pay

## 2018-04-20 ENCOUNTER — Inpatient Hospital Stay (HOSPITAL_COMMUNITY): Payer: Medicare Other | Admitting: Certified Registered Nurse Anesthetist

## 2018-04-20 ENCOUNTER — Encounter (HOSPITAL_COMMUNITY): Payer: Self-pay | Admitting: *Deleted

## 2018-04-20 ENCOUNTER — Inpatient Hospital Stay (HOSPITAL_COMMUNITY): Payer: Medicare Other

## 2018-04-20 ENCOUNTER — Inpatient Hospital Stay (HOSPITAL_COMMUNITY): Payer: Medicare Other | Admitting: Vascular Surgery

## 2018-04-20 DIAGNOSIS — Z8249 Family history of ischemic heart disease and other diseases of the circulatory system: Secondary | ICD-10-CM

## 2018-04-20 DIAGNOSIS — Z8261 Family history of arthritis: Secondary | ICD-10-CM

## 2018-04-20 DIAGNOSIS — Z419 Encounter for procedure for purposes other than remedying health state, unspecified: Secondary | ICD-10-CM

## 2018-04-20 DIAGNOSIS — E785 Hyperlipidemia, unspecified: Secondary | ICD-10-CM | POA: Diagnosis present

## 2018-04-20 DIAGNOSIS — I428 Other cardiomyopathies: Secondary | ICD-10-CM | POA: Diagnosis present

## 2018-04-20 DIAGNOSIS — I2510 Atherosclerotic heart disease of native coronary artery without angina pectoris: Secondary | ICD-10-CM | POA: Diagnosis not present

## 2018-04-20 DIAGNOSIS — Z79899 Other long term (current) drug therapy: Secondary | ICD-10-CM

## 2018-04-20 DIAGNOSIS — N400 Enlarged prostate without lower urinary tract symptoms: Secondary | ICD-10-CM | POA: Diagnosis not present

## 2018-04-20 DIAGNOSIS — M4712 Other spondylosis with myelopathy, cervical region: Secondary | ICD-10-CM | POA: Diagnosis not present

## 2018-04-20 DIAGNOSIS — M4802 Spinal stenosis, cervical region: Secondary | ICD-10-CM | POA: Diagnosis not present

## 2018-04-20 DIAGNOSIS — Z87891 Personal history of nicotine dependence: Secondary | ICD-10-CM | POA: Diagnosis not present

## 2018-04-20 DIAGNOSIS — Z85820 Personal history of malignant melanoma of skin: Secondary | ICD-10-CM

## 2018-04-20 DIAGNOSIS — M47812 Spondylosis without myelopathy or radiculopathy, cervical region: Secondary | ICD-10-CM | POA: Diagnosis not present

## 2018-04-20 DIAGNOSIS — Z7982 Long term (current) use of aspirin: Secondary | ICD-10-CM | POA: Diagnosis not present

## 2018-04-20 DIAGNOSIS — Z801 Family history of malignant neoplasm of trachea, bronchus and lung: Secondary | ICD-10-CM | POA: Diagnosis not present

## 2018-04-20 DIAGNOSIS — Z888 Allergy status to other drugs, medicaments and biological substances status: Secondary | ICD-10-CM | POA: Diagnosis not present

## 2018-04-20 DIAGNOSIS — Z825 Family history of asthma and other chronic lower respiratory diseases: Secondary | ICD-10-CM

## 2018-04-20 DIAGNOSIS — Z9049 Acquired absence of other specified parts of digestive tract: Secondary | ICD-10-CM | POA: Diagnosis not present

## 2018-04-20 DIAGNOSIS — M2578 Osteophyte, vertebrae: Secondary | ICD-10-CM | POA: Diagnosis not present

## 2018-04-20 DIAGNOSIS — Z981 Arthrodesis status: Secondary | ICD-10-CM

## 2018-04-20 DIAGNOSIS — M4322 Fusion of spine, cervical region: Secondary | ICD-10-CM | POA: Diagnosis not present

## 2018-04-20 HISTORY — PX: THROAT SURGERY: SHX803

## 2018-04-20 HISTORY — PX: ANTERIOR CERVICAL DECOMP/DISCECTOMY FUSION: SHX1161

## 2018-04-20 SURGERY — ANTERIOR CERVICAL DECOMPRESSION/DISCECTOMY FUSION 3 LEVELS
Anesthesia: General | Site: Spine Cervical

## 2018-04-20 MED ORDER — LACTATED RINGERS IV SOLN
INTRAVENOUS | Status: DC
  Administered 2018-04-20: 09:00:00 via INTRAVENOUS

## 2018-04-20 MED ORDER — CHLORHEXIDINE GLUCONATE CLOTH 2 % EX PADS: 6.0000 | MEDICATED_PAD | Freq: Once | CUTANEOUS | Status: DC

## 2018-04-20 MED ORDER — FENTANYL CITRATE (PF) 250 MCG/5ML IJ SOLN
INTRAMUSCULAR | Status: AC
  Filled 2018-04-20: qty 5

## 2018-04-20 MED ORDER — CEFAZOLIN SODIUM-DEXTROSE 2-4 GM/100ML-% IV SOLN
2.0000 g | Freq: Three times a day (TID) | INTRAVENOUS | Status: AC
  Administered 2018-04-20 – 2018-04-21 (×2): 2 g via INTRAVENOUS
  Filled 2018-04-20 (×2): qty 100

## 2018-04-20 MED ORDER — FENTANYL CITRATE (PF) 100 MCG/2ML IJ SOLN
INTRAMUSCULAR | Status: AC
  Administered 2018-04-20: 50 ug via INTRAVENOUS
  Filled 2018-04-20: qty 2

## 2018-04-20 MED ORDER — DULOXETINE HCL 30 MG PO CPEP
60.0000 mg | ORAL_CAPSULE | Freq: Every day | ORAL | Status: DC
  Administered 2018-04-20: 60 mg via ORAL
  Filled 2018-04-20: qty 2

## 2018-04-20 MED ORDER — ROCURONIUM BROMIDE 10 MG/ML (PF) SYRINGE
PREFILLED_SYRINGE | INTRAVENOUS | Status: DC | PRN
  Administered 2018-04-20 (×2): 20 mg via INTRAVENOUS
  Administered 2018-04-20: 40 mg via INTRAVENOUS
  Administered 2018-04-20: 10 mg via INTRAVENOUS
  Administered 2018-04-20: 20 mg via INTRAVENOUS
  Administered 2018-04-20: 10 mg via INTRAVENOUS

## 2018-04-20 MED ORDER — ROCURONIUM BROMIDE 50 MG/5ML IV SOSY
PREFILLED_SYRINGE | INTRAVENOUS | Status: AC
  Filled 2018-04-20: qty 10

## 2018-04-20 MED ORDER — ONDANSETRON HCL 4 MG/2ML IJ SOLN: 4.0000 mg | Freq: Four times a day (QID) | INTRAMUSCULAR | Status: DC | PRN

## 2018-04-20 MED ORDER — ACETAMINOPHEN 325 MG PO TABS: 650.0000 mg | ORAL_TABLET | ORAL | Status: DC | PRN

## 2018-04-20 MED ORDER — MORPHINE SULFATE (PF) 2 MG/ML IV SOLN
2.0000 mg | INTRAVENOUS | Status: DC | PRN
  Administered 2018-04-20: 2 mg via INTRAVENOUS
  Filled 2018-04-20 (×2): qty 1

## 2018-04-20 MED ORDER — ACETAMINOPHEN 650 MG RE SUPP: 650.0000 mg | RECTAL | Status: DC | PRN

## 2018-04-20 MED ORDER — ONDANSETRON HCL 4 MG/2ML IJ SOLN
INTRAMUSCULAR | Status: DC | PRN
  Administered 2018-04-20: 4 mg via INTRAVENOUS

## 2018-04-20 MED ORDER — METHOCARBAMOL 500 MG PO TABS
500.0000 mg | ORAL_TABLET | Freq: Four times a day (QID) | ORAL | Status: DC | PRN
  Administered 2018-04-20 (×2): 500 mg via ORAL
  Filled 2018-04-20 (×2): qty 1

## 2018-04-20 MED ORDER — ONDANSETRON HCL 4 MG PO TABS: 4.0000 mg | ORAL_TABLET | Freq: Four times a day (QID) | ORAL | Status: DC | PRN

## 2018-04-20 MED ORDER — BUPIVACAINE HCL (PF) 0.25 % IJ SOLN
INTRAMUSCULAR | Status: AC
  Filled 2018-04-20: qty 30

## 2018-04-20 MED ORDER — DEXAMETHASONE SODIUM PHOSPHATE 10 MG/ML IJ SOLN
10.0000 mg | INTRAMUSCULAR | Status: AC
  Administered 2018-04-20: 10 mg via INTRAVENOUS
  Filled 2018-04-20: qty 1

## 2018-04-20 MED ORDER — METHOCARBAMOL 1000 MG/10ML IJ SOLN
500.0000 mg | Freq: Four times a day (QID) | INTRAVENOUS | Status: DC | PRN
  Filled 2018-04-20: qty 5

## 2018-04-20 MED ORDER — DEXAMETHASONE SODIUM PHOSPHATE 4 MG/ML IJ SOLN: 4.0000 mg | Freq: Four times a day (QID) | INTRAMUSCULAR | Status: DC

## 2018-04-20 MED ORDER — LIDOCAINE 2% (20 MG/ML) 5 ML SYRINGE
INTRAMUSCULAR | Status: DC | PRN
  Administered 2018-04-20: 100 mg via INTRAVENOUS

## 2018-04-20 MED ORDER — THROMBIN 20000 UNITS EX SOLR
CUTANEOUS | Status: AC
  Filled 2018-04-20: qty 20000

## 2018-04-20 MED ORDER — SODIUM CHLORIDE 0.9 % IV SOLN
INTRAVENOUS | Status: DC | PRN
  Administered 2018-04-20: 25 ug/min via INTRAVENOUS

## 2018-04-20 MED ORDER — ONDANSETRON HCL 4 MG/2ML IJ SOLN
INTRAMUSCULAR | Status: AC
  Filled 2018-04-20: qty 2

## 2018-04-20 MED ORDER — EPHEDRINE SULFATE-NACL 50-0.9 MG/10ML-% IV SOSY
PREFILLED_SYRINGE | INTRAVENOUS | Status: DC | PRN
  Administered 2018-04-20: 5 mg via INTRAVENOUS

## 2018-04-20 MED ORDER — POTASSIUM CHLORIDE IN NACL 20-0.9 MEQ/L-% IV SOLN: INTRAVENOUS | Status: DC

## 2018-04-20 MED ORDER — CEFAZOLIN SODIUM-DEXTROSE 2-4 GM/100ML-% IV SOLN
INTRAVENOUS | Status: AC
  Filled 2018-04-20: qty 100

## 2018-04-20 MED ORDER — SENNA 8.6 MG PO TABS
1.0000 | ORAL_TABLET | Freq: Two times a day (BID) | ORAL | Status: DC
  Administered 2018-04-20: 8.6 mg via ORAL
  Filled 2018-04-20: qty 1

## 2018-04-20 MED ORDER — DEXAMETHASONE 4 MG PO TABS
4.0000 mg | ORAL_TABLET | Freq: Four times a day (QID) | ORAL | Status: DC
  Administered 2018-04-20 – 2018-04-21 (×3): 4 mg via ORAL
  Filled 2018-04-20 (×3): qty 1

## 2018-04-20 MED ORDER — FENTANYL CITRATE (PF) 100 MCG/2ML IJ SOLN
INTRAMUSCULAR | Status: AC
  Filled 2018-04-20: qty 2

## 2018-04-20 MED ORDER — SODIUM CHLORIDE 0.9% FLUSH: 3.0000 mL | INTRAVENOUS | Status: DC | PRN

## 2018-04-20 MED ORDER — FENTANYL CITRATE (PF) 100 MCG/2ML IJ SOLN
25.0000 ug | INTRAMUSCULAR | Status: DC | PRN
  Administered 2018-04-20 (×3): 50 ug via INTRAVENOUS

## 2018-04-20 MED ORDER — 0.9 % SODIUM CHLORIDE (POUR BTL) OPTIME
TOPICAL | Status: DC | PRN
  Administered 2018-04-20: 1000 mL

## 2018-04-20 MED ORDER — LIDOCAINE 2% (20 MG/ML) 5 ML SYRINGE
INTRAMUSCULAR | Status: AC
  Filled 2018-04-20: qty 5

## 2018-04-20 MED ORDER — PHENOL 1.4 % MT LIQD: 1.0000 | OROMUCOSAL | Status: DC | PRN

## 2018-04-20 MED ORDER — ROCURONIUM BROMIDE 50 MG/5ML IV SOSY
PREFILLED_SYRINGE | INTRAVENOUS | Status: AC
  Filled 2018-04-20: qty 5

## 2018-04-20 MED ORDER — CEFAZOLIN SODIUM-DEXTROSE 2-4 GM/100ML-% IV SOLN
2.0000 g | INTRAVENOUS | Status: AC
  Administered 2018-04-20: 2 g via INTRAVENOUS

## 2018-04-20 MED ORDER — HYDROCODONE-ACETAMINOPHEN 5-325 MG PO TABS
1.0000 | ORAL_TABLET | ORAL | Status: DC | PRN
  Administered 2018-04-20 (×2): 1 via ORAL
  Filled 2018-04-20 (×2): qty 1

## 2018-04-20 MED ORDER — PROPOFOL 10 MG/ML IV BOLUS
INTRAVENOUS | Status: AC
  Filled 2018-04-20: qty 20

## 2018-04-20 MED ORDER — MEPERIDINE HCL 50 MG/ML IJ SOLN: 6.2500 mg | INTRAMUSCULAR | Status: DC | PRN

## 2018-04-20 MED ORDER — MENTHOL 3 MG MT LOZG: 1.0000 | LOZENGE | OROMUCOSAL | Status: DC | PRN

## 2018-04-20 MED ORDER — EPHEDRINE 5 MG/ML INJ
INTRAVENOUS | Status: AC
  Filled 2018-04-20: qty 10

## 2018-04-20 MED ORDER — SODIUM CHLORIDE 0.9% FLUSH: 3.0000 mL | Freq: Two times a day (BID) | INTRAVENOUS | Status: DC

## 2018-04-20 MED ORDER — PHENYLEPHRINE 40 MCG/ML (10ML) SYRINGE FOR IV PUSH (FOR BLOOD PRESSURE SUPPORT)
PREFILLED_SYRINGE | INTRAVENOUS | Status: DC | PRN
  Administered 2018-04-20 (×3): 80 ug via INTRAVENOUS

## 2018-04-20 MED ORDER — METOCLOPRAMIDE HCL 5 MG/ML IJ SOLN: 10.0000 mg | Freq: Once | INTRAMUSCULAR | Status: DC | PRN

## 2018-04-20 MED ORDER — THROMBIN 5000 UNITS EX SOLR
CUTANEOUS | Status: AC
  Filled 2018-04-20: qty 5000

## 2018-04-20 MED ORDER — PROPOFOL 10 MG/ML IV BOLUS
INTRAVENOUS | Status: DC | PRN
  Administered 2018-04-20: 150 mg via INTRAVENOUS

## 2018-04-20 MED ORDER — FENTANYL CITRATE (PF) 100 MCG/2ML IJ SOLN
25.0000 ug | INTRAMUSCULAR | Status: DC | PRN
  Administered 2018-04-20 (×2): 50 ug via INTRAVENOUS

## 2018-04-20 MED ORDER — FENTANYL CITRATE (PF) 250 MCG/5ML IJ SOLN
INTRAMUSCULAR | Status: DC | PRN
  Administered 2018-04-20: 100 ug via INTRAVENOUS

## 2018-04-20 SURGICAL SUPPLY — 57 items
BAG DECANTER FOR FLEXI CONT (MISCELLANEOUS) ×2 IMPLANT
BASKET BONE COLLECTION (BASKET) ×2 IMPLANT
BENZOIN TINCTURE PRP APPL 2/3 (GAUZE/BANDAGES/DRESSINGS) ×2 IMPLANT
BIT DRILL 14MM (INSTRUMENTS) ×1 IMPLANT
BUR MATCHSTICK NEURO 3.0 LAGG (BURR) ×2 IMPLANT
CANISTER SUCT 3000ML PPV (MISCELLANEOUS) ×2 IMPLANT
CARTRIDGE OIL MAESTRO DRILL (MISCELLANEOUS) ×1 IMPLANT
COVER WAND RF STERILE (DRAPES) IMPLANT
DERMABOND ADVANCED (GAUZE/BANDAGES/DRESSINGS) ×1
DERMABOND ADVANCED .7 DNX12 (GAUZE/BANDAGES/DRESSINGS) ×1 IMPLANT
DIFFUSER DRILL AIR PNEUMATIC (MISCELLANEOUS) ×2 IMPLANT
DRAPE C-ARM 42X72 X-RAY (DRAPES) ×4 IMPLANT
DRAPE LAPAROTOMY 100X72 PEDS (DRAPES) ×2 IMPLANT
DRAPE MICROSCOPE LEICA (MISCELLANEOUS) ×2 IMPLANT
DRILL 14MM (INSTRUMENTS) ×2
DRSG OPSITE POSTOP 4X6 (GAUZE/BANDAGES/DRESSINGS) ×2 IMPLANT
DURAPREP 6ML APPLICATOR 50/CS (WOUND CARE) ×2 IMPLANT
ELECT COATED BLADE 2.86 ST (ELECTRODE) ×2 IMPLANT
ELECT REM PT RETURN 9FT ADLT (ELECTROSURGICAL) ×2
ELECTRODE REM PT RTRN 9FT ADLT (ELECTROSURGICAL) ×1 IMPLANT
EVACUATOR SILICONE 100CC (DRAIN) ×2 IMPLANT
GAUZE 4X4 16PLY RFD (DISPOSABLE) IMPLANT
GLOVE BIO SURGEON STRL SZ7 (GLOVE) IMPLANT
GLOVE BIO SURGEON STRL SZ8 (GLOVE) ×2 IMPLANT
GLOVE BIOGEL PI IND STRL 7.0 (GLOVE) IMPLANT
GLOVE BIOGEL PI INDICATOR 7.0 (GLOVE)
GLOVE SURG SS PI 7.0 STRL IVOR (GLOVE) ×6 IMPLANT
GLOVE SURG SS PI 7.5 STRL IVOR (GLOVE) ×2 IMPLANT
GOWN STRL REUS W/ TWL LRG LVL3 (GOWN DISPOSABLE) ×3 IMPLANT
GOWN STRL REUS W/ TWL XL LVL3 (GOWN DISPOSABLE) ×1 IMPLANT
GOWN STRL REUS W/TWL 2XL LVL3 (GOWN DISPOSABLE) IMPLANT
GOWN STRL REUS W/TWL LRG LVL3 (GOWN DISPOSABLE) ×3
GOWN STRL REUS W/TWL XL LVL3 (GOWN DISPOSABLE) ×1
HEMOSTAT POWDER KIT SURGIFOAM (HEMOSTASIS) ×2 IMPLANT
KIT BASIN OR (CUSTOM PROCEDURE TRAY) ×2 IMPLANT
KIT TURNOVER KIT B (KITS) ×2 IMPLANT
NEEDLE HYPO 25X1 1.5 SAFETY (NEEDLE) ×2 IMPLANT
NEEDLE SPNL 20GX3.5 QUINCKE YW (NEEDLE) ×2 IMPLANT
NS IRRIG 1000ML POUR BTL (IV SOLUTION) ×2 IMPLANT
OIL CARTRIDGE MAESTRO DRILL (MISCELLANEOUS) ×2
PACK LAMINECTOMY NEURO (CUSTOM PROCEDURE TRAY) ×2 IMPLANT
PAD ARMBOARD 7.5X6 YLW CONV (MISCELLANEOUS) ×2 IMPLANT
PIN DISTRACTION 14MM (PIN) ×4 IMPLANT
PLATE 51MM (Plate) ×1 IMPLANT
PLATE 51X3 LVL NS SPNE CVD (Plate) ×1 IMPLANT
RUBBERBAND STERILE (MISCELLANEOUS) ×4 IMPLANT
SCREW CANN 4X16 SS S/DRILL (Screw) ×16 IMPLANT
SPACER PTI-C 6X16X14 7DEG (Spacer) ×2 IMPLANT
SPACER PTI-C 7X16X14 IDENTI (Spacer) ×4 IMPLANT
SPONGE INTESTINAL PEANUT (DISPOSABLE) ×2 IMPLANT
SPONGE SURGIFOAM ABS GEL 100 (HEMOSTASIS) ×2 IMPLANT
STRIP CLOSURE SKIN 1/2X4 (GAUZE/BANDAGES/DRESSINGS) ×2 IMPLANT
SUT VIC AB 3-0 SH 8-18 (SUTURE) ×4 IMPLANT
SUT VICRYL 4-0 PS2 18IN ABS (SUTURE) ×2 IMPLANT
TOWEL GREEN STERILE (TOWEL DISPOSABLE) ×2 IMPLANT
TOWEL GREEN STERILE FF (TOWEL DISPOSABLE) ×2 IMPLANT
WATER STERILE IRR 1000ML POUR (IV SOLUTION) ×2 IMPLANT

## 2018-04-20 NOTE — Anesthesia Procedure Notes (Signed)
Procedure Name: Intubation Date/Time: 04/20/2018 10:27 AM Performed by: Colin Benton, CRNA Pre-anesthesia Checklist: Patient identified, Emergency Drugs available, Suction available and Patient being monitored Patient Re-evaluated:Patient Re-evaluated prior to induction Oxygen Delivery Method: Circle system utilized Preoxygenation: Pre-oxygenation with 100% oxygen Induction Type: IV induction Ventilation: Mask ventilation without difficulty and Oral airway inserted - appropriate to patient size Laryngoscope Size: Mac and 3 Grade View: Grade II Tube type: Oral Tube size: 7.5 mm Number of attempts: 2 Airway Equipment and Method: Stylet Placement Confirmation: ETT inserted through vocal cords under direct vision,  positive ETCO2 and breath sounds checked- equal and bilateral Secured at: 23 cm Tube secured with: Tape Dental Injury: Teeth and Oropharynx as per pre-operative assessment  Comments: DL x 1 by paramedic student Aniceto Boss. Grade 3 view.  DL x2 by CRNA with grade 2 view.  Successful intubation.  EBBS and VSS.

## 2018-04-20 NOTE — Transfer of Care (Signed)
Immediate Anesthesia Transfer of Care Note  Patient: [name] [name]  Procedure(s) Performed: Anterior Cervical Decompression Fusion - Cervical Three-Cervical Four - Cervical Four-Cervical Five - Cervical Five-Cervical Six (N/A Spine Cervical)  Patient Location: PACU  Anesthesia Type:General  Level of Consciousness: drowsy and patient cooperative  Airway & Oxygen Therapy: Patient Spontanous Breathing and Patient connected to face mask oxygen  Post-op Assessment: Report given to RN, Post -op Vital signs reviewed and stable and Patient moving all extremities X 4  Post vital signs: Reviewed and stable  Last Vitals:  Vitals Value Taken Time  BP 118/73 04/20/2018  1:35 PM  Temp    Pulse 91 04/20/2018  1:38 PM  Resp 16 04/20/2018  1:38 PM  SpO2 97 % 04/20/2018  1:38 PM  Vitals shown include unvalidated device data.  Last Pain:  Vitals:   04/20/18 0853  PainSc: 4          Complications: No apparent anesthesia complications

## 2018-04-20 NOTE — Progress Notes (Signed)
Orthopedic Tech Progress Note Patient Details:  [name] [name] 01-15-1946 [id number]  Ortho Devices Type of Ortho Device: Soft collar Ortho Device/Splint Location: delivered to RN Ortho Device/Splint Interventions: Ordered   Post Interventions Patient Tolerated: Well Instructions Provided: Care of device, Adjustment of device   Karolee Stamps 04/20/2018, 3:23 PM

## 2018-04-20 NOTE — H&P (Signed)
Subjective:   Patient is a 73 y.o. male admitted for ACDF. The patient first presented to me with complaints of neck pain and numbness of the arm(s). Onset of symptoms was several months ago. The pain is described as aching and occurs intermittently. The pain is rated moderate, and is located in the neck and radiates to the arms. The symptoms have been progressive. Symptoms are exacerbated by extending head backwards, and are relieved by none.  Previous work up includes MRI of cervical spine, results: spinal stenosis.  Past Medical History:  Diagnosis Date  . Arthritis   . BPH (benign prostatic hypertrophy)   . Coronary atherosclerosis of native coronary vessel    CATH 2004--  MINIMAL NONOBSTRUCTIVE LAD/  NORMAL EF/    CARDIAC CT  03/2008  NO SIGNIFICANT DISEASE  AND  nlef  . Elevated PSA   . Eye problem 03/06/2016   [name] retinal detachment left eye, no sx done  . History of gout    pt 05-20-2013 states stable   . History of melanoma excision    2011-- LEFT UPPER BACK  . Hyperlipemia   . Hypertriglyceridemia   . Left bundle branch block    08/2017  . Melanoma ([place]) 2011   back  . Nonischemic cardiomyopathy ([place])    09-04-2009  --  2D echo-EF 40-45%, Global Hypokinesis  . Numbness    hands  . Personal history of arthritis   . Personal history of other diseases of circulatory system     Past Surgical History:  Procedure Laterality Date  . APPENDECTOMY  AS CHILD  . BACK SURGERY    . CARDIAC CATHETERIZATION  05/23/2002  &  05-23-1998   DR AL LITTLE   minimal irregularities in the LAD/  EF 50%  . CARDIOVASCULAR STRESS TEST  10-05-1998   MILD GLOBAL HYPOKINESIS AND ISCHEMIAIN ANTEROSEPTAL  AT APEX/ EF 42%  . EXCISION MELANOMA LEFT UPPER BACK  10-14-2009  . eye lid surgery Bilateral 2018  . KNEE ARTHROSCOPY WITH SUBCHONDROPLASTY Left 04/07/2017   Procedure: LEFT KNEE ARTHROSCOPY WITH PARTIAL MEDIAL MENISCECTOMY AND MEDIAL TIBIAL SUBCHONDROPLASTY;  Surgeon: Mcarthur Rossetti, MD;  Location: [facility];  Service: Orthopedics;  Laterality: Left;  . knee injections Bilateral   . LUMBAR DISC SURGERY  09-12-2000   left  L5 -- S1  . PROSTATE BIOPSY N/A 05/22/2013   Procedure: BIOPSY TRANSRECTAL ULTRASONIC PROSTATE (TUBP);  Surgeon: [name], MD;  Location: [facility];  Service: Urology;  Laterality: N/A;  . ROTATOR CUFF REPAIR Right 2016  . TRANSRECTAL ULTRASOUND PROSTATE BX  05-23-2005  &  04-19-2001  . TRANSTHORACIC ECHOCARDIOGRAM  09-04-2009   MILD LVH/  MILD GLOBAL HYPOKINESIS/ LVEF 40-45%/  MILD AV SCLEROSIS WITHOUT STENOSIS/  MILD PVR    Allergies  Allergen Reactions  . Nitroglycerin Anaphylaxis    "Cardiologist suggested he shouldn't take this med because it could kill him with his heart condition. Lowers BP"  . Ace Inhibitors Swelling  . Lovastatin Nausea Only  . Solarcaine [Benzocaine] Dermatitis    Social History   Tobacco Use  . Smoking status: Former Smoker    Packs/day: 2.00    Years: 20.00    Pack years: 40.00    Types: Cigarettes    Last attempt to quit: 03/28/1980    Years since quitting: 38.0  . Smokeless tobacco: Never Used  Substance Use Topics  . Alcohol use: No    Alcohol/week: 0.0 standard drinks    Family History  Problem Relation Age of Onset  . Arthritis Mother   . COPD Father   . Cancer Father        lung cancer  . Cancer Sister   . Cardiomyopathy Other        idiopathic. trivial disease 2004 cath, 2010 cardiac CT - no sig. dz, nl LV fxn. cards: Little  . Colon cancer Neg Hx    Prior to Admission medications   Medication Sig Start Date End Date Taking? Authorizing Provider  aspirin EC 81 MG tablet Take 81 mg by mouth daily.   Yes [provider]  DULoxetine (CYMBALTA) 60 MG capsule Take 1 capsule (60 mg total) by mouth daily. 01/10/18  Yes Marcial Pacas, MD  gabapentin (NEURONTIN) 300 MG capsule Take 1-2 capsules (300-600 mg total) by mouth 2 (two) times daily as needed. Patient taking  differently: Take 300-600 mg by mouth 3 (three) times daily as needed (pain).  04/06/17  Yes [name], MD  Garlic 4098 MG CAPS Take 1,000 mg by mouth daily.   Yes [provider]  Ginger, Zingiber officinalis, (GINGER ROOT) 550 MG CAPS Take 550 mg by mouth daily.   Yes [provider]  magnesium oxide (MAG-OX) 400 MG tablet Take 400 mg by mouth daily.   Yes [provider]  Multiple Vitamin (MULTIVITAMIN WITH MINERALS) TABS tablet Take 1 tablet by mouth daily. One-A-Day Multivitamin   Yes [provider]  Omega-3 Fatty Acids (FISH OIL) 500 MG CAPS Take 500 mg by mouth daily.   Yes [provider]  polyvinyl alcohol (LIQUIFILM TEARS) 1.4 % ophthalmic solution Place 1 drop into both eyes 3 (three) times daily as needed for dry eyes.    Yes [provider]  Pseudoephedrine-APAP-DM (DAYQUIL MULTI-SYMPTOM PO) Take 2 capsules by mouth as needed (cold symptoms).   Yes [provider]  rosuvastatin (CRESTOR) 10 MG tablet Take 1 tablet (10 mg total) by mouth daily. Patient taking differently: Take 10 mg by mouth every evening.  02/27/18  Yes [name], MD  sildenafil (VIAGRA) 100 MG tablet 1/2 to 1 tablet up to each day as needed. Patient taking differently: Take 50-100 mg by mouth daily as needed for erectile dysfunction.  07/08/11  Yes [name], MD  Turmeric 500 MG TABS Take 500 mg by mouth daily.   Yes [provider]  vitamin B-12 (CYANOCOBALAMIN) 500 MCG tablet Take 500 mcg by mouth daily.   Yes [provider]  Zinc 50 MG TABS Take 50 mg by mouth daily.   Yes [provider]  ALPRAZolam Duanne Moron) 1 MG tablet Take 1 tablet (1 mg total) by mouth at bedtime as needed (for MRI). Patient not taking: Reported on 04/03/2018 01/10/18   Marcial Pacas, MD  HYDROcodone-acetaminophen (NORCO/VICODIN) 5-325 MG tablet Take 1-2 tablets by mouth every 6 (six) hours as needed for moderate pain. 04/02/18   [name], MD     Review of Systems  Positive ROS: neg  All other systems have been reviewed and were otherwise negative with the exception of those mentioned in the HPI and as above.  Objective: Vital signs in last 24 hours: Temp:  [97.4 F (36.3 C)] 97.4 F (36.3 C) (01/24 0815) Pulse Rate:  [78] 78 (01/24 0815) Resp:  [18] 18 (01/24 0815) BP: (137)/(71) 137/71 (01/24 0815) SpO2:  [98 %] 98 % (01/24 0815)  General Appearance: Alert, cooperative, no distress, appears stated age Head: Normocephalic, without obvious abnormality, atraumatic Eyes:  PERRL, conjunctiva/corneas clear, EOM's intact      Neck: Supple, symmetrical, trachea midline, Back: Symmetric, no curvature, ROM normal, no CVA tenderness Lungs:  respirations unlabored Heart: Regular rate and rhythm Abdomen: Soft, non-tender Extremities: Extremities normal, atraumatic, no cyanosis or edema Pulses: 2+ and symmetric all extremities Skin: Skin color, texture, turgor normal, no rashes or lesions  NEUROLOGIC:  Mental status: Alert and oriented x4, no aphasia, good attention span, fund of knowledge and memory  Motor Exam - grossly normal Sensory Exam - grossly normal Reflexes: 1+ Coordination - grossly normal Gait - grossly normal Balance - grossly normal Cranial Nerves: I: smell Not tested  II: visual acuity  OS: nl    OD: nl  II: visual fields Full to confrontation  II: pupils Equal, round, reactive to light  III,VII: ptosis None  III,IV,VI: extraocular muscles  Full ROM  V: mastication Normal  V: facial light touch sensation  Normal  V,VII: corneal reflex  Present  VII: facial muscle function - upper  Normal  VII: facial muscle function - lower Normal  VIII: hearing Not tested  IX: soft palate elevation  Normal  IX,X: gag reflex Present  XI: trapezius strength  5/5  XI: sternocleidomastoid strength 5/5  XI: neck flexion strength  5/5  XII: tongue strength  Normal    Data Review Lab Results   Component Value Date   WBC 8.3 04/12/2018   HGB 14.0 04/12/2018   HCT 42.8 04/12/2018   MCV 91.5 04/12/2018   PLT 194 04/12/2018   Lab Results  Component Value Date   NA 138 04/12/2018   K 4.8 04/12/2018   CL 105 04/12/2018   CO2 24 04/12/2018   BUN 16 04/12/2018   CREATININE 1.08 04/12/2018   GLUCOSE 118 (H) 04/12/2018   Lab Results  Component Value Date   INR 0.95 04/12/2018    Assessment:   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 Estimated body mass index is 29.02 kg/m as calculated from the following:   Height as of 04/12/18: 5' 9.5" (1.765 m).   Weight as of 04/12/18: 90.4 kg.  Patient has failed conservative therapy. Planned surgery : ACDF C3-4 C4-5 C5-6  Plan:   I explained the condition and procedure to the patient and answered any questions.  Patient wishes to proceed with procedure as planned. Understands risks/ benefits/ and expected or typical outcomes.  [name] [name] 04/20/2018 9:59 AM

## 2018-04-20 NOTE — Plan of Care (Signed)
  Problem: Education: Goal: Ability to verbalize activity precautions or restrictions will improve Outcome: Progressing Goal: Knowledge of the prescribed therapeutic regimen will improve Outcome: Progressing Goal: Understanding of discharge needs will improve Outcome: Progressing   Problem: Pain Management: Goal: Pain level will decrease Outcome: Progressing   Problem: Safety: Goal: Ability to remain free from injury will improve Outcome: Progressing

## 2018-04-20 NOTE — Op Note (Signed)
04/20/2018  1:29 PM  PATIENT:  [name] [name]  73 y.o. 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 Decompressive anterior cervical discectomy C3-4 C4-5 C5-6, 2. Anterior cervical arthrodesis C3-4 C4-5 C5-6 utilizing a porous titanium interbody cage packed with locally harvested morcellized autologous bone graft, 3. Anterior cervical plating C3-C6 inclusive utilizing a ATEC plate  SURGEON:  Sherley Bounds, MD  ASSISTANTS: Meyran FNP  ANESTHESIA:   General  EBL: 100 ml  Total I/O In: -  Out: 100 [Blood:100]  BLOOD ADMINISTERED: none  DRAINS: none  SPECIMEN:  none  INDICATION FOR PROCEDURE: This patient presented with shoulder pain with hand numbness. Imaging showed severe spinal stenosis with cord compression at C3-4 C4-5 C5-6. The patient tried conservative measures without relief. Pain was debilitating. Recommended ACDF with plating. Patient understood the risks, benefits, and alternatives and potential outcomes and wished to proceed.  PROCEDURE DETAILS: Patient was brought to the operating room placed under general endotracheal anesthesia. Patient was placed in the supine position on the operating room table. The neck was prepped with Duraprep and draped in a sterile fashion.   Three cc of local anesthesia was injected and a transverse incision was made on the right side of the neck.  Dissection was carried down thru the subcutaneous tissue and the platysma was  elevated, opened, and undermined with Metzenbaum scissors.  Dissection was then carried out thru an avascular plane leaving the sternocleidomastoid carotid artery and jugular vein laterally and the trachea and esophagus medially. The ventral aspect of the vertebral column was identified and a localizing x-ray was taken. The C4-5 level was identified. The longus colli muscles  were then elevated and the retractor was placed to expose C3-4 C4-5 and C5-6. The annulus was incised at each level and the disc space entered. Discectomy was performed exactly the same at each level and was started with micro-curettes and pituitary rongeurs. I then used the high-speed drill to drill the endplates down to the level of the posterior longitudinal ligament. The drill shavings were saved in a mucous trap for later arthrodesis. The operating microscope was draped and brought into the field provided additional magnification, illumination and visualization. Discectomy was continued posteriorly thru the disc space. Posterior longitudinal ligament was opened with a nerve hook, and then removed along with disc herniation and osteophytes, decompressing the spinal canal and thecal sac. We then continued to remove osteophytic overgrowth and disc material decompressing the neural foramina and exiting nerve roots bilaterally. The scope was angled up and down to help decompress and undercut the vertebral bodies. Once the decompression was completed we could pass a nerve hook circumferentially to assure adequate decompression in the midline and in the neural foramina. So by both visualization and palpation we felt we had an adequate decompression of the neural elements. We then measured the height of the intravertebral disc space and selected a 7 millimeter porous titanium interbody cage packed with autograft. It was then gently positioned in the intravertebral disc space(s) and countersunk. I then used a ATEC plate and placed variable angle screws into the vertebral bodies of each level and locked them into position. The wound was irrigated with bacitracin solution, checked for hemostasis which was established and confirmed. Once meticulous hemostasis was achieved, we then proceeded with closure. The platysma was closed with interrupted 3-0 undyed Vicryl suture, the subcuticular layer was closed  with interrupted  3-0 undyed Vicryl suture. The skin edges were approximated with steristrips. The drapes were removed. A sterile dressing was applied. The patient was then awakened from general anesthesia and transferred to the recovery room in stable condition. At the end of the procedure all sponge, needle and instrument counts were correct.   PLAN OF CARE: Admit to inpatient   PATIENT DISPOSITION:  PACU - hemodynamically stable.   Delay start of Pharmacological VTE agent (>24hrs) due to surgical blood loss or risk of bleeding:  yes

## 2018-04-21 DIAGNOSIS — M47812 Spondylosis without myelopathy or radiculopathy, cervical region: Secondary | ICD-10-CM | POA: Diagnosis not present

## 2018-04-21 DIAGNOSIS — I2510 Atherosclerotic heart disease of native coronary artery without angina pectoris: Secondary | ICD-10-CM | POA: Diagnosis not present

## 2018-04-21 DIAGNOSIS — E785 Hyperlipidemia, unspecified: Secondary | ICD-10-CM | POA: Diagnosis not present

## 2018-04-21 DIAGNOSIS — M2578 Osteophyte, vertebrae: Secondary | ICD-10-CM | POA: Diagnosis not present

## 2018-04-21 DIAGNOSIS — I428 Other cardiomyopathies: Secondary | ICD-10-CM | POA: Diagnosis not present

## 2018-04-21 DIAGNOSIS — M4802 Spinal stenosis, cervical region: Secondary | ICD-10-CM | POA: Diagnosis not present

## 2018-04-21 MED ORDER — ASPIRIN EC 81 MG PO TBEC: 81.0000 mg | DELAYED_RELEASE_TABLET | Freq: Every day | ORAL | Status: AC

## 2018-04-21 MED ORDER — HYDROCODONE-ACETAMINOPHEN 5-325 MG PO TABS: 1.0000 | ORAL_TABLET | ORAL | 0 refills | Status: DC | PRN

## 2018-04-21 MED ORDER — METHOCARBAMOL 500 MG PO TABS: 500.0000 mg | ORAL_TABLET | Freq: Four times a day (QID) | ORAL | 2 refills | Status: DC | PRN

## 2018-04-21 MED ORDER — METHYLPREDNISOLONE 4 MG PO TBPK: ORAL_TABLET | ORAL | 0 refills | Status: DC

## 2018-04-21 NOTE — Progress Notes (Signed)
  NEUROSURGERY PROGRESS NOTE   No issues overnight. Persistent hand numbness, but pre op pain resolved. Minimal neck pain. Mild dysphagia but tolerating diet. Ambulating in hall. Voiding normal.  EXAM:  BP 116/64 (BP Location: Left Arm)   Pulse 80   Temp (!) 97.4 F (36.3 C) (Oral)   Resp 20   Ht 5' 9.5" (1.765 m)   Wt 90.4 kg   SpO2 93%   BMI 29.01 kg/m   Awake, alert, oriented  Speech fluent, appropriate  CN grossly intact  5/5 BUE/BLE  Incision c/d/i  PLAN Doing well. Cleared for d/c

## 2018-04-21 NOTE — Progress Notes (Signed)
Verified w UR team that patient does not need CC44.

## 2018-04-21 NOTE — Discharge Instructions (Signed)
Wound Care Leave incision open to air. You may shower. Do not scrub directly on incision.  Do not put any creams, lotions, or ointments on incision. Activity Walk each and every day, increasing distance each day. No lifting greater than 5 lbs.  Avoid excessive neck motion. No driving for 2 weeks; may ride as a passenger locally. If provided soft collar, may wear for comfort unless otherwise instructed. Diet Resume your normal diet.  Return to Work Will be discussed at you follow up appointment. Call Your Doctor If Any of These Occur Redness, drainage, or swelling at the wound.  Temperature greater than 101 degrees. Severe pain not relieved by pain medication. Increased difficulty swallowing. Incision starts to come apart. Follow Up Appt Call today for appointment in 2-4 weeks [phone]) or for problems.  If you have any hardware placed in your spine, you will need an x-ray before your appointment. 

## 2018-04-21 NOTE — Discharge Summary (Signed)
Physician Discharge Summary  Patient ID: [name] [name] MRN: [id number] DOB/AGE: 1945-06-10 73 y.o.  Admit date: 04/20/2018 Discharge date: 04/21/2018  Admission Diagnoses:  Cervical stenosis  Discharge Diagnoses:  Same Active Problems:   S/P cervical spinal fusion   Discharged Condition: Stable  Hospital Course:  [name] [name] is a 73 y.o. male who was admitted for the below procedure. There were no post operative complications. At time of discharge, pain was well controlled, ambulating with Pt/OT, tolerating po, voiding normal. Ready for discharge.  Treatments: Surgery Decompressive anterior cervical discectomy C3-4 C4-5 C5-6, 2. Anterior cervical arthrodesis C3-4 C4-5 C5-6 utilizing a porous titanium interbody cage packed with locally harvested morcellized autologous bone graft, 3. Anterior cervical plating C3-C6 inclusive utilizing a ATEC plate  Discharge Exam: Blood pressure 116/64, pulse 80, temperature (!) 97.4 F (36.3 C), temperature source Oral, resp. rate 20, height 5' 9.5" (1.765 m), weight 90.4 kg, SpO2 93 %. Awake, alert, oriented Speech fluent, appropriate CN grossly intact 5/5 BUE/BLE Wound c/d/i  Disposition: Discharge disposition: 01-Home or Self Care       Discharge Instructions    Call MD for:  difficulty breathing, headache or visual disturbances   Complete by:  As directed    Call MD for:  persistant dizziness or light-headedness   Complete by:  As directed    Call MD for:  redness, tenderness, or signs of infection (pain, swelling, redness, odor or green/yellow discharge around incision site)   Complete by:  As directed    Call MD for:  severe uncontrolled pain   Complete by:  As directed    Call MD for:  temperature >100.4   Complete by:  As directed    Diet general   Complete by:  As directed    Driving Restrictions   Complete by:  As directed    Do not drive until given clearance.   Increase activity slowly   Complete by:  As  directed    Lifting restrictions   Complete by:  As directed    Do not lift anything >10lbs. Avoid bending and twisting in awkward positions. Avoid bending at the back.   May shower / Bathe   Complete by:  As directed    In 24 hours. Okay to wash wound with warm soapy water. Avoid scrubbing the wound. Pat dry.   Remove dressing in 24 hours   Complete by:  As directed      Allergies as of 04/21/2018      Reactions   Nitroglycerin Anaphylaxis   "Cardiologist suggested he shouldn't take this med because it could kill him with his heart condition. Lowers BP"   Ace Inhibitors Swelling   Lovastatin Nausea Only   Solarcaine [benzocaine] Dermatitis      Medication List    TAKE these medications   ALPRAZolam 1 MG tablet Commonly known as:  XANAX Take 1 tablet (1 mg total) by mouth at bedtime as needed (for MRI).   aspirin EC 81 MG tablet Take 1 tablet (81 mg total) by mouth daily. Start taking on:  April 26, 2018 What changed:  These instructions start on April 26, 2018. If you are unsure what to do until then, ask your doctor or other care provider.   DAYQUIL MULTI-SYMPTOM PO Take 2 capsules by mouth as needed (cold symptoms).   DULoxetine 60 MG capsule Commonly known as:  CYMBALTA Take 1 capsule (60 mg total) by mouth daily.   Fish Oil 500 MG Caps Take 500  mg by mouth daily.   gabapentin 300 MG capsule Commonly known as:  NEURONTIN Take 1-2 capsules (300-600 mg total) by mouth 2 (two) times daily as needed. What changed:    when to take this  reasons to take this   Garlic 2355 MG Caps Take 1,000 mg by mouth daily.   Ginger Root 550 MG Caps Take 550 mg by mouth daily.   HYDROcodone-acetaminophen 5-325 MG tablet Commonly known as:  NORCO/VICODIN Take 1-2 tablets by mouth every 4 (four) hours as needed for moderate pain ((score 4 to 6)). What changed:    when to take this  reasons to take this   magnesium oxide 400 MG tablet Commonly known as:   MAG-OX Take 400 mg by mouth daily.   methocarbamol 500 MG tablet Commonly known as:  ROBAXIN Take 1 tablet (500 mg total) by mouth every 6 (six) hours as needed for muscle spasms.   methylPREDNISolone 4 MG Tbpk tablet Commonly known as:  MEDROL Take according to package insert   multivitamin with minerals Tabs tablet Take 1 tablet by mouth daily. One-A-Day Multivitamin   polyvinyl alcohol 1.4 % ophthalmic solution Commonly known as:  LIQUIFILM TEARS Place 1 drop into both eyes 3 (three) times daily as needed for dry eyes.   rosuvastatin 10 MG tablet Commonly known as:  CRESTOR Take 1 tablet (10 mg total) by mouth daily. What changed:  when to take this   sildenafil 100 MG tablet Commonly known as:  VIAGRA 1/2 to 1 tablet up to each day as needed. What changed:    how much to take  how to take this  when to take this  reasons to take this  additional instructions   Turmeric 500 MG Tabs Take 500 mg by mouth daily.   vitamin B-12 500 MCG tablet Commonly known as:  CYANOCOBALAMIN Take 500 mcg by mouth daily.   Zinc 50 MG Tabs Take 50 mg by mouth daily.      Follow-up Information    Eustace Moore, MD Follow up.   Specialty:  Neurosurgery Contact information: [address] [place] [address] [phone]           Signed: Traci Sermon 04/21/2018, 7:07 AM

## 2018-04-21 NOTE — Progress Notes (Signed)
Patient is discharged from room 3C03 at this time. Alert and in stable condition. IV site d/c'[d and instructions read to patient and spouse with understanding verbalized. Left unit via wheelchair with all belongings at side. 

## 2018-04-23 ENCOUNTER — Encounter (HOSPITAL_COMMUNITY): Payer: Self-pay | Admitting: Neurological Surgery

## 2018-04-24 NOTE — Anesthesia Postprocedure Evaluation (Signed)
Anesthesia Post Note  Patient: [name] [name]  Procedure(s) Performed: Anterior Cervical Decompression Fusion - Cervical Three-Cervical Four - Cervical Four-Cervical Five - Cervical Five-Cervical Six (N/A Spine Cervical)     Patient location during evaluation: PACU Anesthesia Type: General Level of consciousness: awake and alert Pain management: pain level controlled Vital Signs Assessment: post-procedure vital signs reviewed and stable Respiratory status: spontaneous breathing, nonlabored ventilation, respiratory function stable and patient connected to nasal cannula oxygen Cardiovascular status: blood pressure returned to baseline and stable Postop Assessment: no apparent nausea or vomiting Anesthetic complications: no    Last Vitals:  Vitals:   04/21/18 0536 04/21/18 0731  BP: 116/64 127/77  Pulse: 80 73  Resp: 20 18  Temp: (!) 36.3 C   SpO2: 93% 96%    Last Pain:  Vitals:   04/21/18 0800  TempSrc:   PainSc: 2    Pain Goal: Patients Stated Pain Goal: 2 (04/20/18 2343)                 Montez Hageman

## 2018-05-15 ENCOUNTER — Other Ambulatory Visit: Payer: Self-pay

## 2018-05-15 ENCOUNTER — Ambulatory Visit (INDEPENDENT_AMBULATORY_CARE_PROVIDER_SITE_OTHER): Payer: Medicare Other | Admitting: Family Medicine

## 2018-05-15 ENCOUNTER — Encounter: Payer: Self-pay | Admitting: Family Medicine

## 2018-05-15 VITALS — BP 132/72 | HR 83 | Temp 97.6°F | Resp 16 | Ht 69.5 in | Wt 205.2 lb

## 2018-05-15 DIAGNOSIS — E785 Hyperlipidemia, unspecified: Secondary | ICD-10-CM | POA: Diagnosis not present

## 2018-05-15 DIAGNOSIS — R7303 Prediabetes: Secondary | ICD-10-CM | POA: Diagnosis not present

## 2018-05-15 DIAGNOSIS — G6289 Other specified polyneuropathies: Secondary | ICD-10-CM

## 2018-05-15 MED ORDER — GABAPENTIN 300 MG PO CAPS: 300.0000 mg | ORAL_CAPSULE | Freq: Three times a day (TID) | ORAL | 0 refills | Status: DC | PRN

## 2018-05-15 MED ORDER — ROSUVASTATIN CALCIUM 10 MG PO TABS: 10.0000 mg | ORAL_TABLET | Freq: Every evening | ORAL | 1 refills | Status: DC

## 2018-05-15 NOTE — Progress Notes (Signed)
Subjective:    Patient ID: [name] [name], male    DOB: June 07, 1945, 74 y.o.   MRN: [id number]  HPI [name] [name] is a 73 y.o. male Presents today for: Chief Complaint  Patient presents with  . Hyperlipidemia     6 moth f/u last seen here for hyperlipidemia on 11/14/17 no changes at to medication at that time   Hospitalized January 24 through January 25 for cervical stenosis, underwent cervical spinal fusion. Check up last week went well. Has follow up in 1 month.  Here for follow-up of hyperlipidemia.   Hyperlipidemia:  Lab Results  Component Value Date   CHOL 142 11/14/2017   HDL 40 11/14/2017   LDLCALC 68 11/14/2017   TRIG 168 (H) 11/14/2017   CHOLHDL 3.6 11/14/2017   Lab Results  Component Value Date   ALT 21 11/14/2017   AST 19 11/14/2017   ALKPHOS 67 11/14/2017   BILITOT 0.3 11/14/2017  on Crestor 10mg  qd. No new side effects.  Peripheral neuropathy: Has continued gabapentin after neck surgery. Hand symptoms better since surgery, but was recommended to continue gabapentin - takes BID.  Did have some cramps in legs in past as well - helps in past - has taken up to 2 pills tid if needed.   Prediabetes: Lab Results  Component Value Date   HGBA1C 6.2 (H) 03/26/2018   Wt Readings from Last 3 Encounters:  05/15/18 205 lb 3.2 oz (93.1 kg)  04/20/18 199 lb 4.7 oz (90.4 kg)  04/12/18 199 lb 6.4 oz (90.4 kg)  less activity with recent surgery.    Patient Active Problem List   Diagnosis Date Noted  . S/P cervical spinal fusion 04/20/2018  . Paresthesia 01/10/2018  . Neck pain 01/10/2018  . LBBB (left bundle branch block) 09/07/2017  . Other meniscus derangements, posterior horn of medial meniscus, left knee, insufficency fracture medial tibial plateau 04/07/2017  . Closed nondisplaced fracture of lateral condyle of left tibia with routine healing 04/07/2017  . Status post arthroscopy of left knee 04/07/2017  . Insomnia 09/14/2015  . Hyperlipidemia with  target LDL less than 100 09/11/2014  . Rotator cuff tendinitis 04/28/2014  . Obesity (BMI 30-39.9) 12/29/2013  . 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  . Exertional shortness of breath 06/26/2012  . Polyp of colon, adenomatous 04/27/2012  . Elevated PSA 10/20/2011  . Hypertriglyceridemia 07/08/2011  . Nonischemic cardiomyopathy ([place]) 09/04/2009   Past Medical History:  Diagnosis Date  . Arthritis   . BPH (benign prostatic hypertrophy)   . Coronary atherosclerosis of native coronary vessel    CATH 2004--  MINIMAL NONOBSTRUCTIVE LAD/  NORMAL EF/    CARDIAC CT  03/2008  NO SIGNIFICANT DISEASE  AND  nlef  . Elevated PSA   . Eye problem 03/06/2016   [name] retinal detachment left eye, no sx done  . History of gout    pt 05-20-2013 states stable   . History of melanoma excision    2011-- LEFT UPPER BACK  . Hyperlipemia   . Hypertriglyceridemia   . Left bundle branch block    08/2017  . Melanoma ([place]) 2011   back  . Nonischemic cardiomyopathy ([place])    09-04-2009  --  2D echo-EF 40-45%, Global Hypokinesis  . Numbness    hands  . Personal history of arthritis   . Personal history of other diseases of circulatory system    Past Surgical History:  Procedure Laterality Date  . ANTERIOR CERVICAL DECOMP/DISCECTOMY FUSION N/A 04/20/2018  Procedure: Anterior Cervical Decompression Fusion - Cervical Three-Cervical Four - Cervical Four-Cervical Five - Cervical Five-Cervical Six;  Surgeon: Eustace Moore, MD;  Location: [facility];  Service: Neurosurgery;  Laterality: N/A;  Anterior Cervical Decompression Fusion - Cervical Three-Cervical Four - Cervical Four-Cervical Five - Cervical Five-Cervical Six  . APPENDECTOMY  AS CHILD  . BACK SURGERY    . CARDIAC CATHETERIZATION  05/23/2002  &  05-23-1998   DR AL LITTLE   minimal irregularities in the LAD/  EF 50%  . CARDIOVASCULAR STRESS TEST  10-05-1998   MILD GLOBAL HYPOKINESIS AND ISCHEMIAIN ANTEROSEPTAL  AT [facility]/ EF 42%  . EXCISION MELANOMA  LEFT UPPER BACK  10-14-2009  . eye lid surgery Bilateral 2018  . KNEE ARTHROSCOPY WITH SUBCHONDROPLASTY Left 04/07/2017   Procedure: LEFT KNEE ARTHROSCOPY WITH PARTIAL MEDIAL MENISCECTOMY AND MEDIAL TIBIAL SUBCHONDROPLASTY;  Surgeon: Mcarthur Rossetti, MD;  Location: [facility];  Service: Orthopedics;  Laterality: Left;  . knee injections Bilateral   . LUMBAR DISC SURGERY  09-12-2000   left  L5 -- S1  . PROSTATE BIOPSY N/A 05/22/2013   Procedure: BIOPSY TRANSRECTAL ULTRASONIC PROSTATE (TUBP);  Surgeon: [name], MD;  Location: [facility];  Service: Urology;  Laterality: N/A;  . ROTATOR CUFF REPAIR Right 2016  . THROAT SURGERY  04/20/2018  . TRANSRECTAL ULTRASOUND PROSTATE BX  05-23-2005  &  04-19-2001  . TRANSTHORACIC ECHOCARDIOGRAM  09-04-2009   MILD LVH/  MILD GLOBAL HYPOKINESIS/ LVEF 40-45%/  MILD AV SCLEROSIS WITHOUT STENOSIS/  MILD PVR   Allergies  Allergen Reactions  . Nitroglycerin Anaphylaxis    "Cardiologist suggested he shouldn't take this med because it could kill him with his heart condition. Lowers BP"  . Ace Inhibitors Swelling  . Lovastatin Nausea Only  . Solarcaine [Benzocaine] Dermatitis   Prior to Admission medications   Medication Sig Start Date End Date Taking? Authorizing Provider  ALPRAZolam Duanne Moron) 1 MG tablet Take 1 tablet (1 mg total) by mouth at bedtime as needed (for MRI). 01/10/18  Yes Marcial Pacas, MD  aspirin EC 81 MG tablet Take 1 tablet (81 mg total) by mouth daily. 04/26/18  Yes Costella, Vista Mink, PA-C  DULoxetine (CYMBALTA) 60 MG capsule Take 1 capsule (60 mg total) by mouth daily. 01/10/18  Yes Marcial Pacas, MD  gabapentin (NEURONTIN) 300 MG capsule Take 1-2 capsules (300-600 mg total) by mouth 2 (two) times daily as needed. Patient taking differently: Take 300-600 mg by mouth 3 (three) times daily as needed (pain).  04/06/17  Yes [name], MD  Garlic 4132 MG CAPS Take 1,000 mg by mouth daily.   Yes [provider]    Ginger, Zingiber officinalis, (GINGER ROOT) 550 MG CAPS Take 550 mg by mouth daily.   Yes [provider]  HYDROcodone-acetaminophen (NORCO/VICODIN) 5-325 MG tablet Take 1-2 tablets by mouth every 4 (four) hours as needed for moderate pain ((score 4 to 6)). 04/21/18  Yes Costella, Vista Mink, PA-C  magnesium oxide (MAG-OX) 400 MG tablet Take 400 mg by mouth daily.   Yes [provider]  methocarbamol (ROBAXIN) 500 MG tablet Take 1 tablet (500 mg total) by mouth every 6 (six) hours as needed for muscle spasms. 04/21/18  Yes Costella, Vista Mink, PA-C  methylPREDNISolone (MEDROL) 4 MG TBPK tablet Take according to package insert 04/21/18  Yes Costella, Vista Mink, PA-C  Multiple Vitamin (MULTIVITAMIN WITH MINERALS) TABS tablet Take 1 tablet by mouth daily. One-A-Day Multivitamin   Yes [provider]  Omega-3 Fatty Acids (FISH OIL) 500 MG CAPS Take 500 mg by mouth daily.   Yes [provider]  polyvinyl alcohol (LIQUIFILM TEARS) 1.4 % ophthalmic solution Place 1 drop into both eyes 3 (three) times daily as needed for dry eyes.    Yes [provider]  Pseudoephedrine-APAP-DM (DAYQUIL MULTI-SYMPTOM PO) Take 2 capsules by mouth as needed (cold symptoms).   Yes [provider]  rosuvastatin (CRESTOR) 10 MG tablet Take 1 tablet (10 mg total) by mouth daily. Patient taking differently: Take 10 mg by mouth every evening.  02/27/18  Yes [name], MD  sildenafil (VIAGRA) 100 MG tablet 1/2 to 1 tablet up to each day as needed. Patient taking differently: Take 50-100 mg by mouth daily as needed for erectile dysfunction.  07/08/11  Yes [name], MD  Turmeric 500 MG TABS Take 500 mg by mouth daily.   Yes [provider]  vitamin B-12 (CYANOCOBALAMIN) 500 MCG tablet Take 500 mcg by mouth daily.   Yes [provider]  Zinc 50 MG TABS Take 50 mg by mouth daily.   Yes [provider]   Social History   Socioeconomic History   . Marital status: Widowed    Spouse name: Not on file  . Number of children: 2  . Years of education: 42  . Highest education level: High school graduate  Occupational History  . 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  Social Needs  . Financial resource strain: Not on file  . Food insecurity:    Worry: Not on file    Inability: Not on file  . Transportation needs:    Medical: Not on file    Non-medical: Not on file  Tobacco Use  . Smoking status: Former Smoker    Packs/day: 2.00    Years: 20.00    Pack years: 40.00    Types: Cigarettes    Last attempt to quit: 03/28/1980    Years since quitting: 38.1  . Smokeless tobacco: Never Used  Substance and Sexual Activity  . Alcohol use: No    Alcohol/week: 0.0 standard drinks  . Drug use: No  . Sexual activity: Not on file  Lifestyle  . Physical activity:    Days per week: Not on file    Minutes per session: Not on file  . Stress: Not on file  Relationships  . Social connections:    Talks on phone: Not on file    Gets together: Not on file    Attends religious service: Not on file    Active member of club or organization: Not on file    Attends meetings of clubs or organizations: Not on file    Relationship status: Not on file  . Intimate partner violence:    Fear of current or ex partner: Not on file    Emotionally abused: Not on file    Physically abused: Not on file    Forced sexual activity: Not on file  Other Topics Concern  . Not on file  Social History Narrative   He walks on a relatively regular basis about 15 minutes at a time mostly to the discomfort. He previously had walked up a 30-40 minutes a time without problems. Education: [employer].   Lives alone.   Right-handed.   One cup coffee daily.    Review of Systems  Constitutional: Negative for fatigue and unexpected weight change.  Eyes: Negative for visual disturbance.  Respiratory:  Negative for cough, chest  tightness and shortness of breath.   Cardiovascular: Negative for chest pain, palpitations and leg swelling.  Gastrointestinal: Negative for abdominal pain and blood in stool.  Neurological: Negative for dizziness, light-headedness and headaches.   -no regular HA - few occasional HA only.      Objective:   Physical Exam Vitals signs reviewed.  Constitutional:      Appearance: He is well-developed.  HENT:     Head: Normocephalic and atraumatic.  Eyes:     Pupils: Pupils are equal, round, and reactive to light.  Neck:     Vascular: No carotid bruit or JVD.  Cardiovascular:     Rate and Rhythm: Normal rate and regular rhythm.     Heart sounds: Normal heart sounds. No murmur.  Pulmonary:     Effort: Pulmonary effort is normal.     Breath sounds: Normal breath sounds. No rales.  Skin:    General: Skin is warm and dry.  Neurological:     Mental Status: He is alert and oriented to person, place, and time.    Vitals:   05/15/18 0859  BP: 132/72  Pulse: 83  Resp: 16  Temp: 97.6 F (36.4 C)  SpO2: 96%       Assessment & Plan:    [name] [name] is a 73 y.o. male Hyperlipidemia, unspecified hyperlipidemia type - Plan: Lipid Panel, Comprehensive metabolic panel, rosuvastatin (CRESTOR) 10 MG tablet, DISCONTINUED: rosuvastatin (CRESTOR) 10 MG tablet  - Stable, tolerating current regimen. Medications refilled. Labs pending as above.   Prediabetes - Plan: CANCELED: POCT glycosylated hemoglobin (Hb A1C)  - Anticipate improvement with increase in activity as heals from recent surgery.  Other polyneuropathy - Plan: gabapentin (NEURONTIN) 300 MG capsule, DISCONTINUED: gabapentin (NEURONTIN) 300 MG capsule  - continue gabapentin.  Can discuss with surgeon for timing with neck symptoms. As needed for LE symptoms that are stable. Asked to update me on need in next few months  recheck OV in 3 months.   Meds ordered this encounter  Medications  . DISCONTD: gabapentin (NEURONTIN)  300 MG capsule    Sig: Take 1-2 capsules (300-600 mg total) by mouth 3 (three) times daily as needed (pain).    Dispense:  360 capsule    Refill:  0  . DISCONTD: rosuvastatin (CRESTOR) 10 MG tablet    Sig: Take 1 tablet (10 mg total) by mouth every evening.    Dispense:  90 tablet    Refill:  1  . gabapentin (NEURONTIN) 300 MG capsule    Sig: Take 1-2 capsules (300-600 mg total) by mouth 3 (three) times daily as needed (pain).    Dispense:  360 capsule    Refill:  0  . rosuvastatin (CRESTOR) 10 MG tablet    Sig: Take 1 tablet (10 mg total) by mouth every evening.    Dispense:  90 tablet    Refill:  1   Patient Instructions   No change in Crestor dose for now.  I will check some blood work.  Continue gabapentin as needed, but let me know in the next month or 2 how frequently you are using that medication so we can provide appropriate refills.  Follow-up with me in 3 months for repeat prediabetes test, but see information below.  Return to the clinic or go to the nearest emergency room if any of your symptoms worsen or new symptoms occur.   Prediabetes Prediabetes is the condition of having a blood sugar (blood glucose)  level that is higher than it should be, but not high enough for you to be diagnosed with type 2 diabetes. Having prediabetes puts you at risk for developing type 2 diabetes (type 2 diabetes mellitus). Prediabetes may be called impaired glucose tolerance or impaired fasting glucose. Prediabetes usually does not cause symptoms. Your health care provider can diagnose this condition with blood tests. You may be tested for prediabetes if you are overweight and if you have at least one other risk factor for prediabetes. What is blood glucose, and how is it measured? Blood glucose refers to the amount of glucose in your bloodstream. Glucose comes from eating foods that contain sugars and starches (carbohydrates), which the body breaks down into glucose. Your blood glucose level may  be measured in mg/dL (milligrams per deciliter) or mmol/L (millimoles per liter). Your blood glucose may be checked with one or more of the following blood tests:  A fasting blood glucose (FBG) test. You will not be allowed to eat (you will fast) for 8 hours or longer before a blood sample is taken. ? A normal range for FBG is 70-100 mg/dl (3.9-5.6 mmol/L).  An A1c (hemoglobin A1c) blood test. This test provides information about blood glucose control over the previous 2?53months.  An oral glucose tolerance test (OGTT). This test measures your blood glucose at two times: ? After fasting. This is your baseline level. ? Two hours after you drink a beverage that contains glucose. You may be diagnosed with prediabetes:  If your FBG is 100?125 mg/dL (5.6-6.9 mmol/L).  If your A1c level is 5.7?6.4%.  If your OGTT result is 140?199 mg/dL (7.8-11 mmol/L). These blood tests may be repeated to confirm your diagnosis. How can this condition affect me? The pancreas produces a hormone (insulin) that helps to move glucose from the bloodstream into cells. When cells in the body do not respond properly to insulin that the body makes (insulin resistance), excess glucose builds up in the blood instead of going into cells. As a result, high blood glucose (hyperglycemia) can develop, which can cause many complications. Hyperglycemia is a symptom of prediabetes. Having high blood glucose for a long time is dangerous. Too much glucose in your blood can damage your nerves and blood vessels. Long-term damage can lead to complications from diabetes, which may include:  Heart disease.  Stroke.  Blindness.  Kidney disease.  Depression.  Poor circulation in the feet and legs, which could lead to surgical removal (amputation) in severe cases. What can increase my risk? Risk factors for prediabetes include:  Having a family member with type 2 diabetes.  Being overweight or obese.  Being older than age  51.  Being of American [place], African-American, Hispanic/Latino, or Asian/Pacific Islander descent.  Having an inactive (sedentary) lifestyle.  Having a history of heart disease.  History of gestational diabetes or polycystic ovary syndrome (PCOS), in women.  Having low levels of good cholesterol (HDL-C) or high levels of blood fats (triglycerides).  Having high blood pressure. What actions can I take to prevent diabetes?      Be physically active. ? Do moderate-intensity physical activity for 30 or more minutes on 5 or more days of the week, or as much as told by your health care provider. This could be brisk walking, biking, or water aerobics. ? Ask your health care provider what activities are safe for you. A mix of physical activities may be best, such as walking, swimming, cycling, and strength training.  Lose weight as told  by your health care provider. ? Losing 5-7% of your body weight can reverse insulin resistance. ? Your health care provider can determine how much weight loss is best for you and can help you lose weight safely.  Follow a healthy meal plan. This includes eating lean proteins, complex carbohydrates, fresh fruits and vegetables, low-fat dairy products, and healthy fats. ? Follow instructions from your health care provider about eating or drinking restrictions. ? Make an appointment to see a diet and nutrition specialist (registered dietitian) to help you create a healthy eating plan that is right for you.  Do not smoke or use any tobacco products, such as cigarettes, chewing tobacco, and e-cigarettes. If you need help quitting, ask your health care provider.  Take over-the-counter and prescription medicines as told by your health care provider. You may be prescribed medicines that help lower the risk of type 2 diabetes.  Keep all follow-up visits as told by your health care provider. This is important. Summary  Prediabetes is the condition of having a  blood sugar (blood glucose) level that is higher than it should be, but not high enough for you to be diagnosed with type 2 diabetes.  Having prediabetes puts you at risk for developing type 2 diabetes (type 2 diabetes mellitus).  To help prevent type 2 diabetes, make lifestyle changes such as being physically active and eating a healthy diet. Lose weight as told by your health care provider. This information is not intended to replace advice given to you by your health care provider. Make sure you discuss any questions you have with your health care provider. Document Released: 07/06/2015 Document Revised: 11/01/2016 Document Reviewed: 05/05/2015 Elsevier Interactive Patient Education  [employer].   If you have lab work done today you will be contacted with your lab results within the next 2 weeks.  If you have not heard from [place] then please contact us. The fastest way to get your results is to register for My Chart.   IF you received an x-ray today, you will receive an invoice from [facility]. Please contact [facility] at [phone] with questions or concerns regarding your invoice.   IF you received labwork today, you will receive an invoice from [facility]. Please contact [facility] at [phone] with questions or concerns regarding your invoice.   Our billing staff will not be able to assist you with questions regarding bills from these companies.  You will be contacted with the lab results as soon as they are available. The fastest way to get your results is to activate your My Chart account. Instructions are located on the last page of this paperwork. If you have not heard from [place] regarding the results in 2 weeks, please contact this office.       Signed,   [name], MD [facility].  05/18/18 6:59 PM

## 2018-05-15 NOTE — Patient Instructions (Addendum)
No change in Crestor dose for now.  I will check some blood work.  Continue gabapentin as needed, but let me know in the next month or 2 how frequently you are using that medication so we can provide appropriate refills.  Follow-up with me in 3 months for repeat prediabetes test, but see information below.  Return to the clinic or go to the nearest emergency room if any of your symptoms worsen or new symptoms occur.   Prediabetes Prediabetes is the condition of having a blood sugar (blood glucose) level that is higher than it should be, but not high enough for you to be diagnosed with type 2 diabetes. Having prediabetes puts you at risk for developing type 2 diabetes (type 2 diabetes mellitus). Prediabetes may be called impaired glucose tolerance or impaired fasting glucose. Prediabetes usually does not cause symptoms. Your health care provider can diagnose this condition with blood tests. You may be tested for prediabetes if you are overweight and if you have at least one other risk factor for prediabetes. What is blood glucose, and how is it measured? Blood glucose refers to the amount of glucose in your bloodstream. Glucose comes from eating foods that contain sugars and starches (carbohydrates), which the body breaks down into glucose. Your blood glucose level may be measured in mg/dL (milligrams per deciliter) or mmol/L (millimoles per liter). Your blood glucose may be checked with one or more of the following blood tests:  A fasting blood glucose (FBG) test. You will not be allowed to eat (you will fast) for 8 hours or longer before a blood sample is taken. ? A normal range for FBG is 70-100 mg/dl (3.9-5.6 mmol/L).  An A1c (hemoglobin A1c) blood test. This test provides information about blood glucose control over the previous 2?83months.  An oral glucose tolerance test (OGTT). This test measures your blood glucose at two times: ? After fasting. This is your baseline level. ? Two hours after  you drink a beverage that contains glucose. You may be diagnosed with prediabetes:  If your FBG is 100?125 mg/dL (5.6-6.9 mmol/L).  If your A1c level is 5.7?6.4%.  If your OGTT result is 140?199 mg/dL (7.8-11 mmol/L). These blood tests may be repeated to confirm your diagnosis. How can this condition affect me? The pancreas produces a hormone (insulin) that helps to move glucose from the bloodstream into cells. When cells in the body do not respond properly to insulin that the body makes (insulin resistance), excess glucose builds up in the blood instead of going into cells. As a result, high blood glucose (hyperglycemia) can develop, which can cause many complications. Hyperglycemia is a symptom of prediabetes. Having high blood glucose for a long time is dangerous. Too much glucose in your blood can damage your nerves and blood vessels. Long-term damage can lead to complications from diabetes, which may include:  Heart disease.  Stroke.  Blindness.  Kidney disease.  Depression.  Poor circulation in the feet and legs, which could lead to surgical removal (amputation) in severe cases. What can increase my risk? Risk factors for prediabetes include:  Having a family member with type 2 diabetes.  Being overweight or obese.  Being older than age 62.  Being of American [place], African-American, Hispanic/Latino, or Asian/Pacific Islander descent.  Having an inactive (sedentary) lifestyle.  Having a history of heart disease.  History of gestational diabetes or polycystic ovary syndrome (PCOS), in women.  Having low levels of good cholesterol (HDL-C) or high levels of blood fats (  triglycerides).  Having high blood pressure. What actions can I take to prevent diabetes?      Be physically active. ? Do moderate-intensity physical activity for 30 or more minutes on 5 or more days of the week, or as much as told by your health care provider. This could be brisk walking, biking,  or water aerobics. ? Ask your health care provider what activities are safe for you. A mix of physical activities may be best, such as walking, swimming, cycling, and strength training.  Lose weight as told by your health care provider. ? Losing 5-7% of your body weight can reverse insulin resistance. ? Your health care provider can determine how much weight loss is best for you and can help you lose weight safely.  Follow a healthy meal plan. This includes eating lean proteins, complex carbohydrates, fresh fruits and vegetables, low-fat dairy products, and healthy fats. ? Follow instructions from your health care provider about eating or drinking restrictions. ? Make an appointment to see a diet and nutrition specialist (registered dietitian) to help you create a healthy eating plan that is right for you.  Do not smoke or use any tobacco products, such as cigarettes, chewing tobacco, and e-cigarettes. If you need help quitting, ask your health care provider.  Take over-the-counter and prescription medicines as told by your health care provider. You may be prescribed medicines that help lower the risk of type 2 diabetes.  Keep all follow-up visits as told by your health care provider. This is important. Summary  Prediabetes is the condition of having a blood sugar (blood glucose) level that is higher than it should be, but not high enough for you to be diagnosed with type 2 diabetes.  Having prediabetes puts you at risk for developing type 2 diabetes (type 2 diabetes mellitus).  To help prevent type 2 diabetes, make lifestyle changes such as being physically active and eating a healthy diet. Lose weight as told by your health care provider. This information is not intended to replace advice given to you by your health care provider. Make sure you discuss any questions you have with your health care provider. Document Released: 07/06/2015 Document Revised: 11/01/2016 Document Reviewed:  05/05/2015 Elsevier Interactive Patient Education  [employer].   If you have lab work done today you will be contacted with your lab results within the next 2 weeks.  If you have not heard from [place] then please contact us. The fastest way to get your results is to register for My Chart.   IF you received an x-ray today, you will receive an invoice from [facility]. Please contact [facility] at [phone] with questions or concerns regarding your invoice.   IF you received labwork today, you will receive an invoice from [facility]. Please contact [facility] at [phone] with questions or concerns regarding your invoice.   Our billing staff will not be able to assist you with questions regarding bills from these companies.  You will be contacted with the lab results as soon as they are available. The fastest way to get your results is to activate your My Chart account. Instructions are located on the last page of this paperwork. If you have not heard from [place] regarding the results in 2 weeks, please contact this office.

## 2018-05-16 LAB — COMPREHENSIVE METABOLIC PANEL
ALT: 19 IU/L (ref 0–44)
AST: 18 IU/L (ref 0–40)
Albumin/Globulin Ratio: 1.8 (ref 1.2–2.2)
Albumin: 4.4 g/dL (ref 3.7–4.7)
Alkaline Phosphatase: 87 IU/L (ref 39–117)
BUN/Creatinine Ratio: 15 (ref 10–24)
BUN: 16 mg/dL (ref 8–27)
Bilirubin Total: 0.3 mg/dL (ref 0.0–1.2)
CO2: 20 mmol/L (ref 20–29)
Calcium: 9.2 mg/dL (ref 8.6–10.2)
Chloride: 103 mmol/L (ref 96–106)
Creatinine, Ser: 1.04 mg/dL (ref 0.76–1.27)
GFR calc Af Amer: 81 mL/min/{1.73_m2} (ref >59)
GFR calc non Af Amer: 70 mL/min/{1.73_m2} (ref >59)
Globulin, Total: 2.4 g/dL (ref 1.5–4.5)
Glucose: 118 mg/dL — ABNORMAL HIGH (ref 65–99)
Potassium: 4.5 mmol/L (ref 3.5–5.2)
Sodium: 140 mmol/L (ref 134–144)
Total Protein: 6.8 g/dL (ref 6.0–8.5)

## 2018-05-16 LAB — LIPID PANEL
Chol/HDL Ratio: 3.3 ratio (ref 0.0–5.0)
Cholesterol, Total: 132 mg/dL (ref 100–199)
HDL: 40 mg/dL (ref >39)
LDL Calculated: 68 mg/dL (ref 0–99)
Triglycerides: 122 mg/dL (ref 0–149)
VLDL Cholesterol Cal: 24 mg/dL (ref 5–40)

## 2018-05-17 ENCOUNTER — Ambulatory Visit: Payer: Medicare Other | Admitting: Family Medicine

## 2018-05-18 ENCOUNTER — Encounter: Payer: Self-pay | Admitting: Family Medicine

## 2018-05-21 ENCOUNTER — Telehealth: Payer: Self-pay | Admitting: Emergency Medicine

## 2018-05-21 NOTE — Telephone Encounter (Signed)
Spoke with patient and he stated he spoke with someone on the phone, who they reached out to him about he was having back pain and knee pain. Patient was then informed he need to come in to be seen if he need back brace and knee brace. (Robitored call)

## 2018-05-25 NOTE — Telephone Encounter (Signed)
I received a call from Clemetine Marker stating he was with [facility] and with call back ph # of [phone]. He said he faxed request to Dr. Carlota Raspberry for a back brace and knee brace and needed to know when it would be returned. I advised him that we have talked to the pt and asked him to schedule an appt and would not be returning the order until the pt is seen. He asked me several times when it would be returned and I rephrased that the pt needs an appt before the order for items would be signed/returned. He still did not seem to understand. The ph # Karle Starch called from was not the same as the call back # given. He called from [phone] that is on caller ID as "Rito Ehrlich".   Please advise.

## 2018-06-07 DIAGNOSIS — M4802 Spinal stenosis, cervical region: Secondary | ICD-10-CM | POA: Diagnosis not present

## 2018-06-07 DIAGNOSIS — R200 Anesthesia of skin: Secondary | ICD-10-CM | POA: Insufficient documentation

## 2018-06-12 ENCOUNTER — Other Ambulatory Visit: Payer: Self-pay

## 2018-06-12 ENCOUNTER — Ambulatory Visit (INDEPENDENT_AMBULATORY_CARE_PROVIDER_SITE_OTHER): Payer: Medicare Other | Admitting: Physician Assistant

## 2018-06-12 ENCOUNTER — Ambulatory Visit (INDEPENDENT_AMBULATORY_CARE_PROVIDER_SITE_OTHER): Payer: Medicare Other

## 2018-06-12 ENCOUNTER — Encounter (INDEPENDENT_AMBULATORY_CARE_PROVIDER_SITE_OTHER): Payer: Self-pay | Admitting: Physician Assistant

## 2018-06-12 DIAGNOSIS — G8929 Other chronic pain: Secondary | ICD-10-CM | POA: Diagnosis not present

## 2018-06-12 DIAGNOSIS — M25562 Pain in left knee: Secondary | ICD-10-CM

## 2018-06-12 NOTE — Progress Notes (Signed)
Office Visit Note   Patient: [name] [name]           Date of Birth: 1945/08/05           MRN: [id number] Visit Date: 06/12/2018              Requested by: [name] Agreste, MD [address] [place], [place] [address] PCP: [name] Agreste, MD   Assessment & Plan: Visit Diagnoses:  1. Chronic pain of left knee     Plan: At this point he like to avoid surgery at least until next year.  Therefore we will see if he can be approved for new Visco supplementation for the left knee.  He will follow-up with [place] once this is approved.  Questions encouraged and answered.  Follow-Up Instructions: No follow-ups on file.   Orders:  Orders Placed This Encounter  Procedures  . XR Knee 1-2 Views Left   No orders of the defined types were placed in this encounter.     Procedures: No procedures performed   Clinical Data: No additional findings.   Subjective: Chief Complaint  Patient presents with  . Left Knee - Pain    HPI [name] comes in today due to left knee pain.  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  He also underwent chondroplasty of the tibial insufficiency fracture medial compartment.  He last underwent a Monovisc injection in the knee 12/11/2017 he states this helped some but he still having pain with any activities.  He denies any swelling denies any mechanical symptoms.  He states if he is just sitting still he has no pain in the knee.  Review of Systems See HPI otherwise negative  Objective: Vital Signs: There were no vitals taken for this visit.  Physical Exam Constitutional:      Appearance: He is not ill-appearing or diaphoretic.  Pulmonary:     Effort: Pulmonary effort is normal.  Neurological:     Mental Status: He is alert and oriented to person, place, and time.  Psychiatric:        Mood  and Affect: Mood normal.     Ortho Exam Left knee good range of motion without significant pain.  No abnormal warmth erythema or effusion. Specialty Comments:  No specialty comments available.  Imaging: Xr Knee 1-2 Views Left  Result Date: 06/12/2018 Left knee AP and lateral views: No acute fracture.  Moderate narrowing the medial compartment.  Status post chondroplasty increased density.  No other bony abnormalities    PMFS History: Patient Active Problem List   Diagnosis Date Noted  . S/P cervical spinal fusion 04/20/2018  . Paresthesia 01/10/2018  . Neck pain 01/10/2018  . LBBB (left bundle branch block) 09/07/2017  . Other meniscus derangements, posterior horn of medial meniscus, left knee, insufficency fracture medial tibial plateau 04/07/2017  . Closed nondisplaced fracture of lateral condyle of left tibia with routine healing 04/07/2017  . Status post arthroscopy of left knee 04/07/2017  . Insomnia 09/14/2015  . Hyperlipidemia with target LDL less than 100 09/11/2014  . Rotator cuff tendinitis 04/28/2014  . Obesity (BMI 30-39.9) 12/29/2013  . 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  . Exertional shortness of breath 06/26/2012  . Polyp of colon, adenomatous 04/27/2012  . Elevated PSA 10/20/2011  . Hypertriglyceridemia 07/08/2011  . Nonischemic cardiomyopathy ([place]) 09/04/2009   Past Medical History:  Diagnosis Date  . Arthritis   . BPH (benign prostatic hypertrophy)   . Coronary atherosclerosis of native coronary vessel    CATH 2004--  MINIMAL NONOBSTRUCTIVE LAD/  NORMAL EF/    CARDIAC CT  03/2008  NO SIGNIFICANT DISEASE  AND  nlef  . Elevated PSA   . Eye problem 03/06/2016   Dr. Margaretmary [name] retinal detachment left eye, no sx done  . History of gout    pt 05-20-2013 states stable   . History of melanoma excision    2011-- LEFT UPPER BACK  . Hyperlipemia   . Hypertriglyceridemia   . Left bundle branch block    08/2017  . Melanoma ([place]) 2011   back  .  Nonischemic cardiomyopathy ([place])    09-04-2009  --  2D echo-EF 40-45%, Global Hypokinesis  . Numbness    hands  . Personal history of arthritis   . Personal history of other diseases of circulatory system     Family History  Problem Relation Age of Onset  . Arthritis Mother   . COPD Father   . Cancer Father        lung cancer  . Cancer Sister   . Cardiomyopathy Other        idiopathic. trivial disease 2004 cath, 2010 cardiac CT - no sig. dz, nl LV fxn. cards: [name]  . Colon cancer Neg Hx     Past Surgical History:  Procedure Laterality Date  . ANTERIOR CERVICAL DECOMP/DISCECTOMY FUSION N/A 04/20/2018   Procedure: Anterior Cervical Decompression Fusion - Cervical Three-Cervical Four - Cervical Four-Cervical Five - Cervical Five-Cervical Six;  Surgeon: [name] Moore, MD;  Location: [facility];  Service: Neurosurgery;  Laterality: N/A;  Anterior Cervical Decompression Fusion - Cervical Three-Cervical Four - Cervical Four-Cervical Five - Cervical Five-Cervical Six  . APPENDECTOMY  AS CHILD  . BACK SURGERY    . CARDIAC CATHETERIZATION  05/23/2002  &  05-23-1998   DR [name] [name]   minimal irregularities in the LAD/  EF 50%  . CARDIOVASCULAR STRESS TEST  10-05-1998   MILD GLOBAL HYPOKINESIS AND ISCHEMIAIN ANTEROSEPTAL  AT [facility]/ EF 42%  . EXCISION MELANOMA LEFT UPPER BACK  10-14-2009  . eye lid surgery Bilateral 2018  . KNEE ARTHROSCOPY WITH SUBCHONDROPLASTY Left 04/07/2017   Procedure: LEFT KNEE ARTHROSCOPY WITH PARTIAL MEDIAL MENISCECTOMY AND MEDIAL TIBIAL SUBCHONDROPLASTY;  Surgeon: [name] Rossetti, MD;  Location: [facility];  Service: Orthopedics;  Laterality: Left;  . knee injections Bilateral   . LUMBAR DISC SURGERY  09-12-2000   left  L5 -- S1  . PROSTATE BIOPSY N/A 05/22/2013   Procedure: BIOPSY TRANSRECTAL ULTRASONIC PROSTATE (TUBP);  Surgeon: [name] Amass, MD;  Location: [facility];  Service: Urology;  Laterality: N/A;  . ROTATOR CUFF REPAIR Right 2016  . THROAT  SURGERY  04/20/2018  . TRANSRECTAL ULTRASOUND PROSTATE BX  05-23-2005  &  04-19-2001  . TRANSTHORACIC ECHOCARDIOGRAM  09-04-2009   MILD LVH/  MILD GLOBAL HYPOKINESIS/ LVEF 40-45%/  MILD AV SCLEROSIS WITHOUT STENOSIS/  MILD PVR   Social History   Occupational History  . 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  Tobacco Use  . Smoking status: Former Smoker    Packs/day: 2.00    Years: 20.00    Pack years: 40.00    Types: Cigarettes    Last attempt to quit: 03/28/1980    Years since quitting: 38.2  . Smokeless tobacco: Never Used  Substance and Sexual Activity  .  Alcohol use: No    Alcohol/week: 0.0 standard drinks  . Drug use: No  . Sexual activity: Not on file                      +

## 2018-06-15 ENCOUNTER — Telehealth (INDEPENDENT_AMBULATORY_CARE_PROVIDER_SITE_OTHER): Payer: Self-pay

## 2018-06-15 NOTE — Telephone Encounter (Signed)
Submitted VOB for SynviscOne, left knee. 

## 2018-06-19 ENCOUNTER — Telehealth (INDEPENDENT_AMBULATORY_CARE_PROVIDER_SITE_OTHER): Payer: Self-pay

## 2018-06-19 NOTE — Telephone Encounter (Signed)
Talked with patient and advised him that he is approved for gel injection.  Approved for SynviscOne, left knee. Buy & Bill Covered at 100% after Medicare pays. No co-pay No PA required  Appt. 06/21/2018

## 2018-06-21 ENCOUNTER — Other Ambulatory Visit: Payer: Self-pay

## 2018-06-21 ENCOUNTER — Encounter (INDEPENDENT_AMBULATORY_CARE_PROVIDER_SITE_OTHER): Payer: Self-pay | Admitting: Orthopaedic Surgery

## 2018-06-21 ENCOUNTER — Ambulatory Visit (INDEPENDENT_AMBULATORY_CARE_PROVIDER_SITE_OTHER): Payer: Medicare Other | Admitting: Orthopaedic Surgery

## 2018-06-21 DIAGNOSIS — M1712 Unilateral primary osteoarthritis, left knee: Secondary | ICD-10-CM | POA: Diagnosis not present

## 2018-06-21 DIAGNOSIS — G8929 Other chronic pain: Secondary | ICD-10-CM

## 2018-06-21 DIAGNOSIS — R200 Anesthesia of skin: Secondary | ICD-10-CM | POA: Diagnosis not present

## 2018-06-21 DIAGNOSIS — M25562 Pain in left knee: Secondary | ICD-10-CM

## 2018-06-21 MED ORDER — HYLAN G-F 20 48 MG/6ML IX SOSY
48.0000 mg | PREFILLED_SYRINGE | INTRA_ARTICULAR | Status: AC | PRN
  Administered 2018-06-21: 48 mg via INTRA_ARTICULAR

## 2018-06-21 NOTE — Progress Notes (Signed)
   Procedure Note  Patient: [name] [name]             Date of Birth: 09-08-1945           MRN: [id number]             Visit Date: 06/21/2018  Procedures: Visit Diagnoses: Chronic pain of left knee  Primary osteoarthritis of left knee  Large Joint Inj: L knee on 06/21/2018 10:29 AM Indications: pain and diagnostic evaluation Details: 22 G 1.5 in needle, superolateral approach  Arthrogram: No  Medications: 48 mg Hylan 48 MG/6ML Outcome: tolerated well, no immediate complications Procedure, treatment alternatives, risks and benefits explained, specific risks discussed. Consent was given by the patient. Immediately prior to procedure a time out was called to verify the correct patient, procedure, equipment, support staff and site/side marked as required. Patient was prepped and draped in the usual sterile fashion.    The patient comes in today for scheduled hyaluronic acid injection in his left knee to treat the pain from osteoarthritis.  This is been well-documented.  He is tried other forms conservative treatment.  He actually had a knee arthroscopy 14 months ago.  This was with a sub-chondroplasty as well.  He is also had a steroid injection.  He has moderate arthritis in that knee.  He understands fully the rationale behind injection such as this.  There is no knee effusion on exam today of his right knee.  He has mainly medial joint line tenderness with good range of motion.  I placed Synvisc 1 in his left knee without difficulty today.  All question concerns were answered and addressed.  Follow-up as otherwise as needed.

## 2018-07-09 ENCOUNTER — Other Ambulatory Visit: Payer: Self-pay

## 2018-07-09 ENCOUNTER — Telehealth: Payer: Self-pay | Admitting: Family Medicine

## 2018-07-09 ENCOUNTER — Ambulatory Visit: Payer: Self-pay

## 2018-07-09 DIAGNOSIS — G6289 Other specified polyneuropathies: Secondary | ICD-10-CM

## 2018-07-09 NOTE — Telephone Encounter (Signed)
Copied from [place] [id number]. Topic: Quick Communication - Rx Refill/Question >> Jul 09, 2018  2:47 PM Wynetta Emery, [place] C wrote: Medication: gabapentin (NEURONTIN) 300 MG capsule and rosuvastatin (CRESTOR) 10 MG tablet   Has the patient contacted their pharmacy? No  (Agent: If no, request that the patient contact the pharmacy for the refill.) (Agent: If yes, when and what did the pharmacy advise?)  Preferred Pharmacy (with phone number or street name): [facility], [place] [phone] (Phone) [phone] (Fax)    Agent: Please be advised that RX refills may take up to 3 business days. We ask that you follow-up with your pharmacy.

## 2018-07-09 NOTE — Telephone Encounter (Signed)
Pt is requesting medication 

## 2018-07-09 NOTE — Telephone Encounter (Signed)
Copied from [place] [id number]. Topic: General - Other >> Jul 09, 2018  2:51 PM Wynetta Emery, [place] C wrote: Reason for CRM: pt says that he was Dx with carpal tunnel in both of his hands. Pt would like to know if provider could assist him with getting hand brace for both hands.     ALSO, pt says that Almyra Free called him for wellness visit, pt says that their call dropped. He would like to have visit with Almyra Free.

## 2018-07-10 ENCOUNTER — Telehealth: Payer: Self-pay | Admitting: *Deleted

## 2018-07-10 NOTE — Telephone Encounter (Signed)
Called to reschedule AWV

## 2018-07-11 NOTE — Telephone Encounter (Signed)
FYI, this is your patient

## 2018-07-11 NOTE — Telephone Encounter (Signed)
crestor #90 with 1 refill, and gabapentin #360 with 1 refill on 05/15/18. Should not need new rx - please check into this. Thanks.

## 2018-07-12 NOTE — Telephone Encounter (Signed)
I do not see where we have discussed carpal tunnel syndrome.  Was this diagnosed elsewhere?  Wrist braces can be purchased over-the-counter if needed, but more information needed prior to dispensing or prescribing brace.  Thanks.

## 2018-07-13 NOTE — Telephone Encounter (Signed)
I have attempted to call pt and there was no answer so I left a message to call back.  

## 2018-07-24 ENCOUNTER — Ambulatory Visit (INDEPENDENT_AMBULATORY_CARE_PROVIDER_SITE_OTHER): Payer: Medicare Other | Admitting: Family Medicine

## 2018-07-24 ENCOUNTER — Other Ambulatory Visit: Payer: Self-pay

## 2018-07-24 VITALS — BP 132/72 | Ht 69.0 in | Wt 205.0 lb

## 2018-07-24 DIAGNOSIS — Z0000 Encounter for general adult medical examination without abnormal findings: Secondary | ICD-10-CM

## 2018-07-24 NOTE — Progress Notes (Signed)
Note reviewed, and agree with documentation and plan. Signed,   [name], MD [facility].  07/24/18 12:45 PM

## 2018-07-24 NOTE — Telephone Encounter (Signed)
Patient has an appointment scheduled with Dr. Carlota Raspberry will discuss [id number]

## 2018-07-24 NOTE — Progress Notes (Signed)
Presents today for [employer] Visit   Date of last exam: 05/15/2018  Interpreter used for this visit? No  Two identifiers used telephone visit unable to enable doxy me   Patient Care Team: [name], MD as PCP - General (Family Medicine) Leonie Man, MD as PCP - Cardiology (Cardiology) Rana Snare, MD (Urology) Milus Banister, MD (Gastroenterology) Rex Kras Jeanella Craze, MD as Attending Physician (Cardiology)  Dr. Katy Fitch (Opthamology)   Other items to address today:   Has a follow up appointment Dr. Carlota Raspberry on 08-13-2018 Will follow up about neuropathy  Does not want hearing aids Will go when he feels he needs them.  Discussed eye/dental yearly exams Discussed immunizations      Other Screening: Last screening for diabetes: 03/26/2018 Last lipid screening: 05/15/2018  ADVANCE DIRECTIVES: Discussed: yes On File: no Materials Provided: yes  Immunization status:  Immunization History  Administered Date(s) Administered  . Hepatitis B 01/14/2011  . Influenza Split 01/14/2011, 01/18/2012  . Influenza, High Dose Seasonal PF 01/10/2018  . Influenza,inj,Quad PF,6+ Mos 02/04/2013, 12/26/2013, 01/23/2015, 01/22/2016, 12/16/2016  . Pneumococcal Conjugate-13 05/06/2014  . Pneumococcal Polysaccharide-23 03/28/2004, 04/29/2009  . Tdap 08/11/2009  . Zoster 06/11/2013     There are no preventive care reminders to display for this patient.   Functional Status Survey: Is the patient deaf or have difficulty hearing?: Yes Does the patient have difficulty seeing, even when wearing glasses/contacts?: No Does the patient have difficulty concentrating, remembering, or making decisions?: No Does the patient have difficulty walking or climbing stairs?: No Does the patient have difficulty dressing or bathing?: No Does the patient have difficulty doing errands alone such as visiting a doctor's office or shopping?: No   6CIT Screen 07/24/2018  What  Year? 0 points  What month? 0 points  What time? 0 points  Count back from 20 0 points  Months in reverse 0 points  Repeat phrase 0 points  Total Score 0        Clinical Support from 07/24/2018 in [place] at [facility]  AUDIT-C Score  0       Home Environment:   Lives in a four story with lady friend No trouble climbing stairs No grab bars No scattered rugs Well lit   Patient Active Problem List   Diagnosis Date Noted  . S/P cervical spinal fusion 04/20/2018  . Paresthesia 01/10/2018  . Neck pain 01/10/2018  . LBBB (left bundle branch block) 09/07/2017  . Other meniscus derangements, posterior horn of medial meniscus, left knee, insufficency fracture medial tibial plateau 04/07/2017  . Closed nondisplaced fracture of lateral condyle of left tibia with routine healing 04/07/2017  . Status post arthroscopy of left knee 04/07/2017  . Insomnia 09/14/2015  . Hyperlipidemia with target LDL less than 100 09/11/2014  . Rotator cuff tendinitis 04/28/2014  . Obesity (BMI 30-39.9) 12/29/2013  . 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  . Exertional shortness of breath 06/26/2012  . Polyp of colon, adenomatous 04/27/2012  . Elevated PSA 10/20/2011  . Hypertriglyceridemia 07/08/2011  . Nonischemic cardiomyopathy ([place]) 09/04/2009     Past Medical History:  Diagnosis Date  . Arthritis   . BPH (benign prostatic hypertrophy)   . Coronary atherosclerosis of native coronary vessel    CATH 2004--  MINIMAL NONOBSTRUCTIVE LAD/  NORMAL EF/    CARDIAC CT  03/2008  NO SIGNIFICANT DISEASE  AND  nlef  . Elevated PSA   . Eye problem 03/06/2016   [name] retinal  detachment left eye, no sx done  . History of gout    pt 05-20-2013 states stable   . History of melanoma excision    2011-- LEFT UPPER BACK  . Hyperlipemia   . Hypertriglyceridemia   . Left bundle branch block    08/2017  . Melanoma ([place]) 2011   back  . Nonischemic cardiomyopathy ([place])    09-04-2009  --  2D echo-EF  40-45%, Global Hypokinesis  . Numbness    hands  . Personal history of arthritis   . Personal history of other diseases of circulatory system      Past Surgical History:  Procedure Laterality Date  . ANTERIOR CERVICAL DECOMP/DISCECTOMY FUSION N/A 04/20/2018   Procedure: Anterior Cervical Decompression Fusion - Cervical Three-Cervical Four - Cervical Four-Cervical Five - Cervical Five-Cervical Six;  Surgeon: Eustace Moore, MD;  Location: [place];  Service: Neurosurgery;  Laterality: N/A;  Anterior Cervical Decompression Fusion - Cervical Three-Cervical Four - Cervical Four-Cervical Five - Cervical Five-Cervical Six  . APPENDECTOMY  AS CHILD  . BACK SURGERY    . CARDIAC CATHETERIZATION  05/23/2002  &  05-23-1998   DR AL LITTLE   minimal irregularities in the LAD/  EF 50%  . CARDIOVASCULAR STRESS TEST  10-05-1998   MILD GLOBAL HYPOKINESIS AND ISCHEMIAIN ANTEROSEPTAL  AT [facility]/ EF 42%  . EXCISION MELANOMA LEFT UPPER BACK  10-14-2009  . eye lid surgery Bilateral 2018  . KNEE ARTHROSCOPY WITH SUBCHONDROPLASTY Left 04/07/2017   Procedure: LEFT KNEE ARTHROSCOPY WITH PARTIAL MEDIAL MENISCECTOMY AND MEDIAL TIBIAL SUBCHONDROPLASTY;  Surgeon: Mcarthur Rossetti, MD;  Location: [facility];  Service: Orthopedics;  Laterality: Left;  . knee injections Bilateral   . LUMBAR DISC SURGERY  09-12-2000   left  L5 -- S1  . PROSTATE BIOPSY N/A 05/22/2013   Procedure: BIOPSY TRANSRECTAL ULTRASONIC PROSTATE (TUBP);  Surgeon: [name], MD;  Location: [facility];  Service: Urology;  Laterality: N/A;  . ROTATOR CUFF REPAIR Right 2016  . THROAT SURGERY  04/20/2018  . TRANSRECTAL ULTRASOUND PROSTATE BX  05-23-2005  &  04-19-2001  . TRANSTHORACIC ECHOCARDIOGRAM  09-04-2009   MILD LVH/  MILD GLOBAL HYPOKINESIS/ LVEF 40-45%/  MILD AV SCLEROSIS WITHOUT STENOSIS/  MILD PVR     Family History  Problem Relation Age of Onset  . Arthritis Mother   . COPD Father   . Cancer Father        lung cancer   . Cancer Sister   . Cardiomyopathy Other        idiopathic. trivial disease 2004 cath, 2010 cardiac CT - no sig. dz, nl LV fxn. cards: Little  . Colon cancer Neg Hx      Social History   Socioeconomic History  . Marital status: Widowed    Spouse name: Not on file  . Number of children: 2  . Years of education: 46  . Highest education level: High school graduate  Occupational History  . 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  Social Needs  . Financial resource strain: Not on file  . Food insecurity:    Worry: Not on file    Inability: Not on file  . Transportation needs:    Medical: Not on file    Non-medical: Not on file  Tobacco Use  . Smoking status: Former Smoker    Packs/day: 2.00    Years: 20.00    Pack years: 40.00    Types: Cigarettes  Last attempt to quit: 03/28/1980    Years since quitting: 38.3  . Smokeless tobacco: Never Used  Substance and Sexual Activity  . Alcohol use: No    Alcohol/week: 0.0 standard drinks  . Drug use: No  . Sexual activity: Not on file  Lifestyle  . Physical activity:    Days per week: Not on file    Minutes per session: Not on file  . Stress: Not on file  Relationships  . Social connections:    Talks on phone: Not on file    Gets together: Not on file    Attends religious service: Not on file    Active member of club or organization: Not on file    Attends meetings of clubs or organizations: Not on file    Relationship status: Not on file  . Intimate partner violence:    Fear of current or ex partner: Not on file    Emotionally abused: Not on file    Physically abused: Not on file    Forced sexual activity: Not on file  Other Topics Concern  . Not on file  Social History Narrative   He walks on a relatively regular basis about 15 minutes at a time mostly to the discomfort. He previously had walked up a 30-40 minutes a time without problems. Education: [employer].   Lives alone.    Right-handed.   One cup coffee daily.     Allergies  Allergen Reactions  . Nitroglycerin Anaphylaxis    "Cardiologist suggested he shouldn't take this med because it could kill him with his heart condition. Lowers BP"  . Ace Inhibitors Swelling  . Lovastatin Nausea Only  . Solarcaine [Benzocaine] Dermatitis     Prior to Admission medications   Medication Sig Start Date End Date Taking? Authorizing Provider  ALPRAZolam Duanne Moron) 1 MG tablet Take 1 tablet (1 mg total) by mouth at bedtime as needed (for MRI). 01/10/18  Yes Marcial Pacas, MD  gabapentin (NEURONTIN) 300 MG capsule Take 1-2 capsules (300-600 mg total) by mouth 3 (three) times daily as needed (pain). 05/15/18  Yes [name], MD  Garlic 5916 MG CAPS Take 1,000 mg by mouth daily.   Yes [provider]  Ginger, Zingiber officinalis, (GINGER ROOT) 550 MG CAPS Take 550 mg by mouth daily.   Yes [provider]  magnesium oxide (MAG-OX) 400 MG tablet Take 400 mg by mouth daily.   Yes [provider]  Omega-3 Fatty Acids (FISH OIL) 500 MG CAPS Take 500 mg by mouth daily.   Yes [provider]  polyvinyl alcohol (LIQUIFILM TEARS) 1.4 % ophthalmic solution Place 1 drop into both eyes 3 (three) times daily as needed for dry eyes.    Yes [provider]  rosuvastatin (CRESTOR) 10 MG tablet Take 1 tablet (10 mg total) by mouth every evening. 05/15/18  Yes [name], MD  sildenafil (VIAGRA) 100 MG tablet 1/2 to 1 tablet up to each day as needed. Patient taking differently: Take 50-100 mg by mouth daily as needed for erectile dysfunction.  07/08/11  Yes [name], MD  Turmeric 500 MG TABS Take 500 mg by mouth daily.   Yes [provider]  vitamin B-12 (CYANOCOBALAMIN) 500 MCG tablet Take 500 mcg by mouth daily.   Yes [provider]  Zinc 50 MG TABS Take 50 mg by mouth daily.   Yes [provider]  aspirin EC 81 MG tablet Take 1 tablet (81 mg total)  by  mouth daily. Patient not taking: Reported on 07/24/2018 04/26/18   Traci Sermon, PA-C  DULoxetine (CYMBALTA) 60 MG capsule Take 1 capsule (60 mg total) by mouth daily. Patient not taking: Reported on 07/24/2018 01/10/18   Marcial Pacas, MD  HYDROcodone-acetaminophen (NORCO/VICODIN) 5-325 MG tablet Take 1-2 tablets by mouth every 4 (four) hours as needed for moderate pain ((score 4 to 6)). Patient not taking: Reported on 07/24/2018 04/21/18   Traci Sermon, PA-C  methocarbamol (ROBAXIN) 500 MG tablet Take 1 tablet (500 mg total) by mouth every 6 (six) hours as needed for muscle spasms. 04/21/18   Costella, Vista Mink, PA-C  methylPREDNISolone (MEDROL) 4 MG TBPK tablet Take according to package insert 04/21/18   Costella, Vista Mink, PA-C  Multiple Vitamin (MULTIVITAMIN WITH MINERALS) TABS tablet Take 1 tablet by mouth daily. One-A-Day Multivitamin    [provider]  Pseudoephedrine-APAP-DM (DAYQUIL MULTI-SYMPTOM PO) Take 2 capsules by mouth as needed (cold symptoms).    [provider]     Depression screen [facility] 2/9 07/24/2018 05/15/2018 04/02/2018 03/26/2018 11/14/2017  Decreased Interest 0 0 0 0 0  Down, Depressed, Hopeless 0 0 0 0 0  PHQ - 2 Score 0 0 0 0 0     Fall Risk  07/24/2018 05/15/2018 04/02/2018 03/26/2018 11/14/2017  Falls in the past year? 0 0 1 0 No  Number falls in past yr: 0 0 0 - -  Injury with Fall? 0 0 1 - -  Comment - - injury to the left side of chest/ribs - -  Risk for fall due to : - - Other (Comment) - -  Follow up - Falls evaluation completed - - -      PHYSICAL EXAM: BP 132/72   Ht 5\' 9"  (1.753 m)   Wt 205 lb (93 kg)   BMI 30.27 kg/m    Wt Readings from Last 3 Encounters:  07/24/18 205 lb (93 kg)  05/15/18 205 lb 3.2 oz (93.1 kg)  04/20/18 199 lb 4.7 oz (90.4 kg)     No exam data present    Physical Exam   Education/Counseling provided regarding diet and exercise, prevention of chronic diseases, smoking/tobacco cessation, if  applicable, and reviewed "Covered Medicare Preventive Services."   ASSESSMENT/PLAN: 1. Medicare annual wellness visit, subsequent

## 2018-07-24 NOTE — Patient Instructions (Addendum)
Thank you for taking time to come for your Medicare Wellness Visit. I appreciate your ongoing commitment to your health goals. Please review the following plan we discussed and let me know if I can assist you in the future.  Leroy Kennedy LPN   Healthy Eating Following a healthy eating pattern may help you to achieve and maintain a healthy body weight, reduce the risk of chronic disease, and live a long and productive life. It is important to follow a healthy eating pattern at an appropriate calorie level for your body. Your nutritional needs should be met primarily through food by choosing a variety of nutrient-rich foods. What are tips for following this plan? Reading food labels  Read labels and choose the following: ? Reduced or low sodium. ? Juices with 100% fruit juice. ? Foods with low saturated fats and high polyunsaturated and monounsaturated fats. ? Foods with whole grains, such as whole wheat, cracked wheat, brown rice, and wild rice. ? Whole grains that are fortified with folic acid. This is recommended for women who are pregnant or who want to become pregnant.  Read labels and avoid the following: ? Foods with a lot of added sugars. These include foods that contain brown sugar, corn sweetener, corn syrup, dextrose, fructose, glucose, high-fructose corn syrup, honey, invert sugar, lactose, malt syrup, maltose, molasses, raw sugar, sucrose, trehalose, or turbinado sugar.  Do not eat more than the following amounts of added sugar per day:  6 teaspoons (25 g) for women.  9 teaspoons (38 g) for men. ? Foods that contain processed or refined starches and grains. ? Refined grain products, such as white flour, degermed cornmeal, white bread, and white rice. Shopping  Choose nutrient-rich snacks, such as vegetables, whole fruits, and nuts. Avoid high-calorie and high-sugar snacks, such as potato chips, fruit snacks, and candy.  Use oil-based dressings and spreads on foods instead of  solid fats such as butter, stick margarine, or cream cheese.  Limit pre-made sauces, mixes, and "instant" products such as flavored rice, instant noodles, and ready-made pasta.  Try more plant-protein sources, such as tofu, tempeh, black beans, edamame, lentils, nuts, and seeds.  Explore eating plans such as the Mediterranean diet or vegetarian diet. Cooking  Use oil to saut or stir-fry foods instead of solid fats such as butter, stick margarine, or lard.  Try baking, boiling, grilling, or broiling instead of frying.  Remove the fatty part of meats before cooking.  Steam vegetables in water or broth. Meal planning   At meals, imagine dividing your plate into fourths: ? One-half of your plate is fruits and vegetables. ? One-fourth of your plate is whole grains. ? One-fourth of your plate is protein, especially lean meats, poultry, eggs, tofu, beans, or nuts.  Include low-fat dairy as part of your daily diet. Lifestyle  Choose healthy options in all settings, including home, work, school, restaurants, or stores.  Prepare your food safely: ? Wash your hands after handling raw meats. ? Keep food preparation surfaces clean by regularly washing with hot, soapy water. ? Keep raw meats separate from ready-to-eat foods, such as fruits and vegetables. ? Cook seafood, meat, poultry, and eggs to the recommended internal temperature. ? Store foods at safe temperatures. In general:  Keep cold foods at 24F (4.4C) or below.  Keep hot foods at 124F (60C) or above.  Keep your freezer at [facility] (-17.8C) or below.  Foods are no longer safe to eat when they have been between the temperatures of 40-124F (4.4-60C)  for more than 2 hours. What foods should I eat? Fruits Aim to eat 2 cup-equivalents of fresh, canned (in natural juice), or frozen fruits each day. Examples of 1 cup-equivalent of fruit include 1 small apple, 8 large strawberries, 1 cup canned fruit,  cup dried fruit, or 1 cup  100% juice. Vegetables Aim to eat 2-3 cup-equivalents of fresh and frozen vegetables each day, including different varieties and colors. Examples of 1 cup-equivalent of vegetables include 2 medium carrots, 2 cups raw, leafy greens, 1 cup chopped vegetable (raw or cooked), or 1 medium baked potato. Grains Aim to eat 6 ounce-equivalents of whole grains each day. Examples of 1 ounce-equivalent of grains include 1 slice of bread, 1 cup ready-to-eat cereal, 3 cups popcorn, or  cup cooked rice, pasta, or cereal. Meats and other proteins Aim to eat 5-6 ounce-equivalents of protein each day. Examples of 1 ounce-equivalent of protein include 1 egg, 1/2 cup nuts or seeds, or 1 tablespoon (16 g) peanut butter. A cut of meat or fish that is the size of a deck of cards is about 3-4 ounce-equivalents.  Of the protein you eat each week, try to have at least 8 ounces come from seafood. This includes salmon, trout, herring, and anchovies. Dairy Aim to eat 3 cup-equivalents of fat-free or low-fat dairy each day. Examples of 1 cup-equivalent of dairy include 1 cup (240 mL) milk, 8 ounces (250 g) yogurt, 1 ounces (44 g) natural cheese, or 1 cup (240 mL) fortified soy milk. Fats and oils  Aim for about 5 teaspoons (21 g) per day. Choose monounsaturated fats, such as canola and olive oils, avocados, peanut butter, and most nuts, or polyunsaturated fats, such as sunflower, corn, and soybean oils, walnuts, pine nuts, sesame seeds, sunflower seeds, and flaxseed. Beverages  Aim for six 8-oz glasses of water per day. Limit coffee to three to five 8-oz cups per day.  Limit caffeinated beverages that have added calories, such as soda and energy drinks.  Limit alcohol intake to no more than 1 drink a day for nonpregnant women and 2 drinks a day for men. One drink equals 12 oz of beer (355 mL), 5 oz of wine (148 mL), or 1 oz of hard liquor (44 mL). Seasoning and other foods  Avoid adding excess amounts of salt to your  foods. Try flavoring foods with herbs and spices instead of salt.  Avoid adding sugar to foods.  Try using oil-based dressings, sauces, and spreads instead of solid fats. This information is based on general [place] nutrition guidelines. For more information, visit choosemyplate.gov. Exact amounts may vary based on your nutrition needs. Summary  A healthy eating plan may help you to maintain a healthy weight, reduce the risk of chronic diseases, and stay active throughout your life.  Plan your meals. Make sure you eat the right portions of a variety of nutrient-rich foods.  Try baking, boiling, grilling, or broiling instead of frying.  Choose healthy options in all settings, including home, work, school, restaurants, or stores. This information is not intended to replace advice given to you by your health care provider. Make sure you discuss any questions you have with your health care provider. Document Released: 06/26/2017 Document Revised: 06/26/2017 Document Reviewed: 06/26/2017 Elsevier Interactive Patient Education  2019 [employer]  

## 2018-08-07 DIAGNOSIS — H02831 Dermatochalasis of right upper eyelid: Secondary | ICD-10-CM | POA: Diagnosis not present

## 2018-08-07 DIAGNOSIS — D3132 Benign neoplasm of left choroid: Secondary | ICD-10-CM | POA: Diagnosis not present

## 2018-08-07 DIAGNOSIS — R200 Anesthesia of skin: Secondary | ICD-10-CM | POA: Diagnosis not present

## 2018-08-07 DIAGNOSIS — H2513 Age-related nuclear cataract, bilateral: Secondary | ICD-10-CM | POA: Diagnosis not present

## 2018-08-07 DIAGNOSIS — H02834 Dermatochalasis of left upper eyelid: Secondary | ICD-10-CM | POA: Diagnosis not present

## 2018-08-07 DIAGNOSIS — H43812 Vitreous degeneration, left eye: Secondary | ICD-10-CM | POA: Diagnosis not present

## 2018-08-10 ENCOUNTER — Telehealth: Payer: Self-pay | Admitting: Family Medicine

## 2018-08-10 NOTE — Telephone Encounter (Signed)
Called pt LVM to call back and convert 08/13/2018 appt to virtual visit   FR

## 2018-08-13 ENCOUNTER — Telehealth (INDEPENDENT_AMBULATORY_CARE_PROVIDER_SITE_OTHER): Payer: Medicare Other | Admitting: Family Medicine

## 2018-08-13 DIAGNOSIS — R7303 Prediabetes: Secondary | ICD-10-CM | POA: Diagnosis not present

## 2018-08-13 DIAGNOSIS — G6289 Other specified polyneuropathies: Secondary | ICD-10-CM

## 2018-08-13 MED ORDER — GABAPENTIN 300 MG PO CAPS: 300.0000 mg | ORAL_CAPSULE | Freq: Three times a day (TID) | ORAL | 1 refills | Status: DC | PRN

## 2018-08-13 NOTE — Patient Instructions (Signed)
° ° ° °  If you have lab work done today you will be contacted with your lab results within the next 2 weeks.  If you have not heard from us then please contact us. The fastest way to get your results is to register for My Chart. ° ° °IF you received an x-ray today, you will receive an invoice from [facility]. Please contact [facility] at [phone] with questions or concerns regarding your invoice.  ° °IF you received labwork today, you will receive an invoice from [facility]. Please contact [facility] at [phone] with questions or concerns regarding your invoice.  ° °Our billing staff will not be able to assist you with questions regarding bills from these companies. ° °You will be contacted with the lab results as soon as they are available. The fastest way to get your results is to activate your My Chart account. Instructions are located on the last page of this paperwork. If you have not heard from us regarding the results in 2 weeks, please contact this office. °  ° ° ° °

## 2018-08-13 NOTE — Progress Notes (Signed)
Virtual Visit via Telephone Note  I connected with [name] [name] on 08/13/18 at 2:30 PM by telephone and verified that I am speaking with the correct person using two identifiers.   I discussed the limitations, risks, security and privacy concerns of performing an evaluation and management service by telephone and the availability of in person appointments. I also discussed with the patient that there may be a patient responsible charge related to this service. The patient expressed understanding and agreed to proceed, consent obtained  Chief complaint:  Prediabetes, neuropathy.  History of Present Illness: [name] [name] is a 73 y.o. male   Peripheral neuropathy: Discussed at February visit.  Previous neck surgery, with continuing gabapentin.  Option of decreasing dosing as symptoms improved was discussed.  Twice daily, and occasionally takes for cramps in legs up to 2 pills 3 times daily if needed.  Has tolerated that dosing without any new side effects.  Currently using gabapentin twice per day - 1 in morning, 2 in afternoon - mostly for leg issues/cramping. Neck is doing better. Prior nerve issue form back - gabapentin has been controlling symptoms.   Prediabetes: Lab Results  Component Value Date   HGBA1C 6.2 (H) 03/26/2018  Discussed in February.  As activity was increasing from surgery, expected improved readings. Has been more active since that time. Considering knee replacement at end of year if continued issues.  Has cut back some on sodas, but still drinking some.   Question regarding blood typing:  Does not know blood type, and would like to have checked. Has asked about this in past. Thought that it was checked by now.      Patient Active Problem List   Diagnosis Date Noted  . S/P cervical spinal fusion 04/20/2018  . Paresthesia 01/10/2018  . Neck pain 01/10/2018  . LBBB (left bundle branch block) 09/07/2017  . Other meniscus derangements, posterior  horn of medial meniscus, left knee, insufficency fracture medial tibial plateau 04/07/2017  . Closed nondisplaced fracture of lateral condyle of left tibia with routine healing 04/07/2017  . Status post arthroscopy of left knee 04/07/2017  . Insomnia 09/14/2015  . Hyperlipidemia with target LDL less than 100 09/11/2014  . Rotator cuff tendinitis 04/28/2014  . Obesity (BMI 30-39.9) 12/29/2013  . 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  . Exertional shortness of breath 06/26/2012  . Polyp of colon, adenomatous 04/27/2012  . Elevated PSA 10/20/2011  . Hypertriglyceridemia 07/08/2011  . Nonischemic cardiomyopathy ([place]) 09/04/2009   Past Medical History:  Diagnosis Date  . Arthritis   . BPH (benign prostatic hypertrophy)   . Coronary atherosclerosis of native coronary vessel    CATH 2004--  MINIMAL NONOBSTRUCTIVE LAD/  NORMAL EF/    CARDIAC CT  03/2008  NO SIGNIFICANT DISEASE  AND  nlef  . Elevated PSA   . Eye problem 03/06/2016   [name] retinal detachment left eye, no sx done  . History of gout    pt 05-20-2013 states stable   . History of melanoma excision    2011-- LEFT UPPER BACK  . Hyperlipemia   . Hypertriglyceridemia   . Left bundle branch block    08/2017  . Melanoma ([place]) 2011   back  . Nonischemic cardiomyopathy ([place])    09-04-2009  --  2D echo-EF 40-45%, Global Hypokinesis  . Numbness    hands  . Personal history of arthritis   . Personal history of other diseases of circulatory system    Past Surgical History:  Procedure Laterality Date  . ANTERIOR CERVICAL DECOMP/DISCECTOMY FUSION N/A 04/20/2018   Procedure: Anterior Cervical Decompression Fusion - Cervical Three-Cervical Four - Cervical Four-Cervical Five - Cervical Five-Cervical Six;  Surgeon: Eustace Moore, MD;  Location: [facility];  Service: Neurosurgery;  Laterality: N/A;  Anterior Cervical Decompression Fusion - Cervical Three-Cervical Four - Cervical Four-Cervical Five - Cervical Five-Cervical Six  .  APPENDECTOMY  AS CHILD  . BACK SURGERY    . CARDIAC CATHETERIZATION  05/23/2002  &  05-23-1998   DR AL LITTLE   minimal irregularities in the LAD/  EF 50%  . CARDIOVASCULAR STRESS TEST  10-05-1998   MILD GLOBAL HYPOKINESIS AND ISCHEMIAIN ANTEROSEPTAL  AT [facility]/ EF 42%  . EXCISION MELANOMA LEFT UPPER BACK  10-14-2009  . eye lid surgery Bilateral 2018  . KNEE ARTHROSCOPY WITH SUBCHONDROPLASTY Left 04/07/2017   Procedure: LEFT KNEE ARTHROSCOPY WITH PARTIAL MEDIAL MENISCECTOMY AND MEDIAL TIBIAL SUBCHONDROPLASTY;  Surgeon: Mcarthur Rossetti, MD;  Location: [facility];  Service: Orthopedics;  Laterality: Left;  . knee injections Bilateral   . LUMBAR DISC SURGERY  09-12-2000   left  L5 -- S1  . PROSTATE BIOPSY N/A 05/22/2013   Procedure: BIOPSY TRANSRECTAL ULTRASONIC PROSTATE (TUBP);  Surgeon: [name], MD;  Location: [facility];  Service: Urology;  Laterality: N/A;  . ROTATOR CUFF REPAIR Right 2016  . THROAT SURGERY  04/20/2018  . TRANSRECTAL ULTRASOUND PROSTATE BX  05-23-2005  &  04-19-2001  . TRANSTHORACIC ECHOCARDIOGRAM  09-04-2009   MILD LVH/  MILD GLOBAL HYPOKINESIS/ LVEF 40-45%/  MILD AV SCLEROSIS WITHOUT STENOSIS/  MILD PVR   Allergies  Allergen Reactions  . Nitroglycerin Anaphylaxis    "Cardiologist suggested he shouldn't take this med because it could kill him with his heart condition. Lowers BP"  . Ace Inhibitors Swelling  . Lovastatin Nausea Only  . Solarcaine [Benzocaine] Dermatitis   Prior to Admission medications   Medication Sig Start Date End Date Taking? Authorizing Provider  aspirin EC 81 MG tablet Take 1 tablet (81 mg total) by mouth daily. 04/26/18  Yes Costella, Vista Mink, PA-C  gabapentin (NEURONTIN) 300 MG capsule Take 1-2 capsules (300-600 mg total) by mouth 3 (three) times daily as needed (pain). 05/15/18  Yes [name], MD  rosuvastatin (CRESTOR) 10 MG tablet Take 1 tablet (10 mg total) by mouth every evening. 05/15/18  Yes [name], MD  sildenafil (VIAGRA) 100 MG tablet 1/2 to 1 tablet up to each day as needed. Patient taking differently: Take 50-100 mg by mouth daily as needed for erectile dysfunction.  07/08/11  Yes [name], MD  ALPRAZolam Duanne Moron) 1 MG tablet Take 1 tablet (1 mg total) by mouth at bedtime as needed (for MRI). Patient not taking: Reported on 08/13/2018 01/10/18   Marcial Pacas, MD  DULoxetine (CYMBALTA) 60 MG capsule Take 1 capsule (60 mg total) by mouth daily. Patient not taking: Reported on 08/13/2018 01/10/18   Marcial Pacas, MD  Garlic 7824 MG CAPS Take 1,000 mg by mouth daily.    [provider]  Ginger, Zingiber officinalis, (GINGER ROOT) 550 MG CAPS Take 550 mg by mouth daily.    [provider]  HYDROcodone-acetaminophen (NORCO/VICODIN) 5-325 MG tablet Take 1-2 tablets by mouth every 4 (four) hours as needed for moderate pain ((score 4 to 6)). Patient not taking: Reported on 07/24/2018 04/21/18   Traci Sermon, PA-C  magnesium oxide (MAG-OX) 400 MG tablet Take 400 mg by mouth daily.  [provider]  methocarbamol (ROBAXIN) 500 MG tablet Take 1 tablet (500 mg total) by mouth every 6 (six) hours as needed for muscle spasms. Patient not taking: Reported on 08/13/2018 04/21/18   Traci Sermon, PA-C  methylPREDNISolone (MEDROL) 4 MG TBPK tablet Take according to package insert Patient not taking: Reported on 08/13/2018 04/21/18   Traci Sermon, PA-C  Multiple Vitamin (MULTIVITAMIN WITH MINERALS) TABS tablet Take 1 tablet by mouth daily. One-A-Day Multivitamin    [provider]  Omega-3 Fatty Acids (FISH OIL) 500 MG CAPS Take 500 mg by mouth daily.    [provider]  polyvinyl alcohol (LIQUIFILM TEARS) 1.4 % ophthalmic solution Place 1 drop into both eyes 3 (three) times daily as needed for dry eyes.     [provider]  Pseudoephedrine-APAP-DM (DAYQUIL MULTI-SYMPTOM PO) Take 2 capsules by mouth as needed (cold symptoms).     [provider]  Turmeric 500 MG TABS Take 500 mg by mouth daily.    [provider]  vitamin B-12 (CYANOCOBALAMIN) 500 MCG tablet Take 500 mcg by mouth daily.    [provider]  Zinc 50 MG TABS Take 50 mg by mouth daily.    [provider]   Social History   Socioeconomic History  . Marital status: Widowed    Spouse name: Not on file  . Number of children: 2  . Years of education: 22  . Highest education level: High school graduate  Occupational History  . 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  Social Needs  . Financial resource strain: Not on file  . Food insecurity:    Worry: Not on file    Inability: Not on file  . Transportation needs:    Medical: Not on file    Non-medical: Not on file  Tobacco Use  . Smoking status: Former Smoker    Packs/day: 2.00    Years: 20.00    Pack years: 40.00    Types: Cigarettes    Last attempt to quit: 03/28/1980    Years since quitting: 38.4  . Smokeless tobacco: Never Used  Substance and Sexual Activity  . Alcohol use: No    Alcohol/week: 0.0 standard drinks  . Drug use: No  . Sexual activity: Not on file  Lifestyle  . Physical activity:    Days per week: Not on file    Minutes per session: Not on file  . Stress: Not on file  Relationships  . Social connections:    Talks on phone: Not on file    Gets together: Not on file    Attends religious service: Not on file    Active member of club or organization: Not on file    Attends meetings of clubs or organizations: Not on file    Relationship status: Not on file  . Intimate partner violence:    Fear of current or ex partner: Not on file    Emotionally abused: Not on file    Physically abused: Not on file    Forced sexual activity: Not on file  Other Topics Concern  . Not on file  Social History Narrative   He walks on a relatively regular basis about 15 minutes at a time mostly to the discomfort. He  previously had walked up a 30-40 minutes a time without problems. Education: [employer].   Lives alone.   Right-handed.   One cup coffee daily.     Observations/Objective: No  distress.  Appropriate responses. Lab Results  Component Value Date   HGBA1C 6.1 (H) 08/15/2018    Assessment and Plan: Prediabetes  -Continue watch diet, exercise.  A1c stable. Repeat 6 months.   Other polyneuropathy  -Overall stable with persistent need for gabapentin.  Has used for both cervical symptoms as well as leg symptoms.  Denies any new side effects, continue same.  Discussed testing for blood type and possible out-of-pocket cost.  Declined at this time.  Donating blood may also be easier option for determining his blood type.  Understanding expressed  Follow Up Instructions: Lab visit and 6 months.  Patient Instructions       If you have lab work done today you will be contacted with your lab results within the next 2 weeks.  If you have not heard from [place] then please contact us. The fastest way to get your results is to register for My Chart.   IF you received an x-ray today, you will receive an invoice from  [facility]. Please contact [facility] at [phone] with questions or concerns regarding your invoice.   IF you received labwork today, you will receive an invoice from [facility]. Please contact [facility] at [phone] with questions or concerns regarding your invoice.   Our billing staff will not be able to assist you with questions regarding bills from these companies.  You will be contacted with the lab results as soon as they are available. The fastest way to get your results is to activate your My Chart account. Instructions are located on the last page of this paperwork. If you have not heard from [place] regarding the results in 2 weeks, please contact this office.        I discussed the assessment and treatment plan with the patient. The patient was  provided an opportunity to ask questions and all were answered. The patient agreed with the plan and demonstrated an understanding of the instructions.   The patient was advised to call back or seek an in-person evaluation if the symptoms worsen or if the condition fails to improve as anticipated.  I provided 15 minutes of non-face-to-face time during this encounter.  Signed,   [name], MD [facility].  08/13/18

## 2018-08-13 NOTE — Progress Notes (Signed)
CC: 3 month f/u prediabetes and neuropathy.  Pt is wanting to talk with dr. About obtaining his blood type.  Per pt he has requested this several times with no response and he is 73 yrs old and doesn't know his blood type.  He advises if something were to happen knowing his blood pressure would save time and possibly save his life.  Pt very adamant about discussing this with dr and getting results.  I advised I would relay message.  No recent or bp taken.  No travel outside the [place] or [place] in the past 3 weeks.

## 2018-08-15 ENCOUNTER — Other Ambulatory Visit: Payer: Self-pay

## 2018-08-15 ENCOUNTER — Ambulatory Visit: Payer: Medicare Other

## 2018-08-15 DIAGNOSIS — R7303 Prediabetes: Secondary | ICD-10-CM

## 2018-08-15 LAB — HEMOGLOBIN A1C
Est. average glucose Bld gHb Est-mCnc: 128 mg/dL
Hgb A1c MFr Bld: 6.1 % — ABNORMAL HIGH (ref 4.8–5.6)

## 2018-08-19 ENCOUNTER — Telehealth: Payer: Self-pay | Admitting: Family Medicine

## 2018-08-19 DIAGNOSIS — G6289 Other specified polyneuropathies: Secondary | ICD-10-CM

## 2018-08-21 ENCOUNTER — Other Ambulatory Visit: Payer: Self-pay

## 2018-08-21 DIAGNOSIS — G6289 Other specified polyneuropathies: Secondary | ICD-10-CM

## 2018-08-21 MED ORDER — GABAPENTIN 300 MG PO CAPS: 300.0000 mg | ORAL_CAPSULE | Freq: Three times a day (TID) | ORAL | 1 refills | Status: DC | PRN

## 2018-08-21 NOTE — Telephone Encounter (Signed)
gabapentin (NEURONTIN) 300 MG capsule [[id number]]   Medication sent to wrong pharmacy. Needs to be sent  [place], [place] [phone] (Phone) [phone] (Fax)   Please advise

## 2018-08-21 NOTE — Telephone Encounter (Signed)
Sent to right pharmacy.

## 2018-09-06 DIAGNOSIS — R200 Anesthesia of skin: Secondary | ICD-10-CM | POA: Diagnosis not present

## 2018-09-06 DIAGNOSIS — M4802 Spinal stenosis, cervical region: Secondary | ICD-10-CM | POA: Diagnosis not present

## 2018-09-12 ENCOUNTER — Telehealth: Payer: Self-pay | Admitting: *Deleted

## 2018-09-12 NOTE — Telephone Encounter (Signed)
Left message for patient to call back - need reschedule appt to virtual appt 6/23 /2020 with dr harding . Need to discuss instructions for appt.

## 2018-09-13 NOTE — Telephone Encounter (Signed)
Left message to call back- need to change to virtual visit

## 2018-09-17 NOTE — Telephone Encounter (Signed)
Chg'd visit to virtual, call [phone], pre reg complete, obtained consent -- ttf

## 2018-09-17 NOTE — Telephone Encounter (Signed)
Returned call to patient no answer.LMTC. 

## 2018-09-17 NOTE — Telephone Encounter (Signed)
F/U Message            Patient is returning Sharon's call would like a call back

## 2018-09-18 ENCOUNTER — Encounter: Payer: Self-pay | Admitting: Cardiology

## 2018-09-18 ENCOUNTER — Telehealth: Payer: Self-pay | Admitting: *Deleted

## 2018-09-18 ENCOUNTER — Telehealth (INDEPENDENT_AMBULATORY_CARE_PROVIDER_SITE_OTHER): Payer: Medicare Other | Admitting: Cardiology

## 2018-09-18 VITALS — Ht 69.0 in | Wt 187.2 lb

## 2018-09-18 DIAGNOSIS — E669 Obesity, unspecified: Secondary | ICD-10-CM

## 2018-09-18 DIAGNOSIS — R0602 Shortness of breath: Secondary | ICD-10-CM

## 2018-09-18 DIAGNOSIS — I447 Left bundle-branch block, unspecified: Secondary | ICD-10-CM

## 2018-09-18 DIAGNOSIS — I428 Other cardiomyopathies: Secondary | ICD-10-CM

## 2018-09-18 DIAGNOSIS — E785 Hyperlipidemia, unspecified: Secondary | ICD-10-CM

## 2018-09-18 NOTE — Telephone Encounter (Signed)
Spoke with patient - instruction given from televisit - avs summary will be mailed . Patient verbalized understanding.

## 2018-09-18 NOTE — Patient Instructions (Addendum)
Medication Instructions:   No changes  If you need a refill on your cardiac medications before your next appointment, please call your pharmacy.   Lab work:   not needed  Testing/Procedures:   not needed  Follow-Up: At [facility], you and your health needs are our priority.  As part of our continuing mission to provide you with exceptional heart care, we have created designated Provider Care Teams.  These Care Teams include your primary Cardiologist (physician) and Advanced Practice Providers (APPs -  Physician Assistants and Nurse Practitioners) who all work together to provide you with the care you need, when you need it. . You will need a follow up appointment in  12  Months June 2021.  Please call our office 2 months in advance to schedule this appointment.  You may see Glenetta Hew, MD or one of the following Advanced Practice Providers on your designated Care Team:   . Rosaria Ferries, PA-C . Jory Sims, DNP, ANP  Any Other Special Instructions Will Be Listed Below (If Applicable).

## 2018-09-18 NOTE — Progress Notes (Signed)
Virtual Visit via Telephone Note   This visit type was conducted due to national recommendations for restrictions regarding the COVID-19 Pandemic (e.g. social distancing) in an effort to limit this patient's exposure and mitigate transmission in our community.  Due to his co-morbid illnesses, this patient is at least at moderate risk for complications without adequate follow up.  This format is felt to be most appropriate for this patient at this time.  The patient did not have access to video technology/had technical difficulties with video requiring transitioning to audio format only (telephone).  All issues noted in this document were discussed and addressed.  No physical exam could be performed with this format.  Please refer to the patient's chart for his  consent to telehealth for [name] [name].   Patient has given verbal permission to conduct this visit via virtual appointment and to bill insurance 09/18/2018 2:03 PM     Evaluation Performed:  Follow-up visit  Date:  09/19/2018   ID:  [name] [name], [name] [name] Aug 01, 1945, MRN [id number]  Patient Location: Home Provider Location: Home  PCP:  [name] Agreste, MD  Cardiologist:  [name] Hew, MD  Electrophysiologist:  None   Chief Complaint: Annual follow-up for moderate nonischemic cardiomyopathy, left bundle branch block.  History of Present Illness:    [name] [name] is a 73 y.o. 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  [name] [name] presents via Engineer, civil (consulting) for a telehealth visit today.  [name] [name] was last seen in June 2019 --he was doing well with no major complaints.  Not doing routine exercise but does "work hard with yard work and house chores.  Only notes exertional dyspnea if he "pushes himself ".  Was somewhat out of shape because recent left knee injury. -->   As such noted some exertional dyspnea.  Probably needs Knee replacement this fall. Had C-spine Sgx by Dr. Ronnald [name] this spring.  Recovering pretty well - just released. Was thought to be related to hand numbness -> but did not help. Has to wear a bandage - ? Related to PN.  Interval History:  [name] presents here today overall doing quite well.  He remains active, but more from doing yard work and housework then from actually doing exercise.  He also enjoys fishing and hunting which are his relaxing times.  The only thing he notes is that he does not have the same energy that he had back in his 9s but otherwise doing fine.  He denies any heart failure symptoms of PND, orthopnea or edema.  He denies any chest tightness or pressure with rest or exertion.  No exertional dyspnea.  He recently had C-spine surgery by Dr. Sherley [name], and is healing up well from that.  But occasionally has times when he loses his breath and get somewhat dizzy and lightheaded after taking a big drink of water.  Otherwise no near syncope or syncope type symptoms.   Remainder of cardiovascular ROS: negative for - irregular heartbeat, orthopnea, palpitations, paroxysmal nocturnal dyspnea, rapid heart rate, shortness of breath or TIA/amaurosis fugax, claudication  The patient does not have symptoms concerning for COVID-19 infection (fever, chills, cough, or new shortness of breath).  The patient is practicing social distancing.  Limits outings - uses masks.  ROS:  Please see the history of present illness.    Review of Systems  Constitutional: Negative for chills, fever, malaise/fatigue and weight loss.  HENT: Negative for congestion and nosebleeds.   Respiratory: Negative for cough and shortness of breath.   Gastrointestinal: Negative for blood in stool, constipation, diarrhea, heartburn, melena, nausea and vomiting.       Occasionally with big drink of water - takes breath away & feels dizzy.  Musculoskeletal: Positive  for joint pain (L knee still bothering him) and neck pain (recovering from C-Spine Sgx. ). Negative for falls.  Neurological: Negative for dizziness, weakness and headaches.  Psychiatric/Behavioral: Negative for memory loss. The patient is not nervous/anxious and does not have insomnia.     Past Medical History:  Diagnosis Date  . Arthritis   . BPH (benign prostatic hypertrophy)   . Coronary atherosclerosis of native coronary vessel    CATH 2004--  MINIMAL NONOBSTRUCTIVE LAD/  NORMAL EF/    CARDIAC CT  03/2008  NO SIGNIFICANT DISEASE  AND  nlef  . Elevated PSA   . Eye problem 03/06/2016   Dr. Margaretmary [name] retinal detachment left eye, no sx done  . History of gout    pt 05-20-2013 states stable   . History of melanoma excision    2011-- LEFT UPPER BACK  . Hyperlipemia   . Hypertriglyceridemia   . Left bundle branch block    08/2017  . Melanoma ([place]) 2011   back  . Nonischemic cardiomyopathy ([employer])    09-04-2009  --  2D echo-EF 40-45%, Global Hypokinesis - 09/2017 -> EF 45-50%   . Numbness    hands  . Personal history of arthritis   . Personal history of other diseases of circulatory system    Past Surgical History:  Procedure Laterality Date  . ANTERIOR CERVICAL DECOMP/DISCECTOMY FUSION N/A 04/20/2018   Procedure: Anterior Cervical Decompression Fusion - Cervical Three-Cervical Four - Cervical Four-Cervical Five - Cervical Five-Cervical Six;  Surgeon: [name] Moore, MD;  Location: [facility];  Service: Neurosurgery;  Laterality: N/A;  Anterior Cervical Decompression Fusion - Cervical Three-Cervical Four - Cervical Four-Cervical Five - Cervical Five-Cervical Six  . APPENDECTOMY  AS CHILD  . BACK SURGERY    . CARDIAC CATHETERIZATION  05/23/2002  &  05-23-1998   DR AL [name]   minimal irregularities in the LAD/  EF 50%  . CARDIOVASCULAR STRESS TEST  10-05-1998   MILD GLOBAL HYPOKINESIS AND ISCHEMIAIN ANTEROSEPTAL  AT [facility]/ EF 42%  . EXCISION MELANOMA LEFT UPPER BACK  10-14-2009  . eye lid  surgery Bilateral 2018  . KNEE ARTHROSCOPY WITH SUBCHONDROPLASTY Left 04/07/2017   Procedure: LEFT KNEE ARTHROSCOPY WITH PARTIAL MEDIAL MENISCECTOMY AND MEDIAL TIBIAL SUBCHONDROPLASTY;  Surgeon: Mcarthur Rossetti, MD;  Location: [facility];  Service: Orthopedics;  Laterality: Left;  . knee injections Bilateral   . LUMBAR DISC SURGERY  09-12-2000   left  L5 -- S1  . PROSTATE BIOPSY N/A 05/22/2013   Procedure: BIOPSY TRANSRECTAL ULTRASONIC PROSTATE (TUBP);  Surgeon: [name], MD;  Location: [name];  Service: Urology;  Laterality: N/A;  . ROTATOR CUFF REPAIR Right 2016  . THROAT SURGERY  04/20/2018  . TRANSRECTAL ULTRASOUND PROSTATE BX  05-23-2005  &  04-19-2001  . TRANSTHORACIC ECHOCARDIOGRAM  09/2017   EF 45-50 % (previously reported as 40 to 45%).  Incoordinate septal motion with mild LVH.  GR 1 DD.  Aortic sclerosis but no stenosis.     Current Meds  Medication Sig  . aspirin EC 81 MG tablet Take 1 tablet (81 mg total) by mouth  daily.  . gabapentin (NEURONTIN) 300 MG capsule Take 1-2 capsules (300-600 mg total) by mouth 3 (three) times daily as needed (pain).  . Garlic 1829 MG CAPS Take 1,000 mg by mouth daily.  . Ginger, Zingiber officinalis, (GINGER ROOT) 550 MG CAPS Take 550 mg by mouth daily.  . magnesium oxide (MAG-OX) 400 MG tablet Take 400 mg by mouth daily.  . Multiple Vitamin (MULTIVITAMIN WITH MINERALS) TABS tablet Take 1 tablet by mouth daily. One-A-Day Multivitamin  . Omega-3 Fatty Acids (FISH OIL) 500 MG CAPS Take 500 mg by mouth daily.  . polyvinyl alcohol (LIQUIFILM TEARS) 1.4 % ophthalmic solution Place 1 drop into both eyes 3 (three) times daily as needed for dry eyes.   . Pseudoephedrine-APAP-DM (DAYQUIL MULTI-SYMPTOM PO) Take 2 capsules by mouth as needed (cold symptoms).  . rosuvastatin (CRESTOR) 10 MG tablet Take 1 tablet (10 mg total) by mouth every evening.  . sildenafil (VIAGRA) 100 MG tablet 1/2 to 1 tablet up to each day as needed.  (Patient taking differently: Take 50-100 mg by mouth daily as needed for erectile dysfunction. )  . Turmeric 500 MG TABS Take 500 mg by mouth daily.  . vitamin B-12 (CYANOCOBALAMIN) 500 MCG tablet Take 500 mcg by mouth daily.  . Zinc 50 MG TABS Take 50 mg by mouth daily.     Allergies:   Nitroglycerin, Ace inhibitors, Lovastatin, and Solarcaine [benzocaine]   Social History   Tobacco Use  . Smoking status: Former Smoker    Packs/day: 2.00    Years: 20.00    Pack years: 40.00    Types: Cigarettes    Quit date: 03/28/1980    Years since quitting: 38.5  . Smokeless tobacco: Never Used  Substance Use Topics  . Alcohol use: No    Alcohol/week: 0.0 standard drinks  . Drug use: No     Family Hx: The patient's family history includes Arthritis in his mother; COPD in his father; Cancer in his father and sister; Cardiomyopathy in an other family member. There is no history of Colon cancer.   Prior CV studies:   The following studies were reviewed today: . TTE 10/16/2017: EF 45-50 %.  Incoordinate septal motion with mild LVH.  GR 1 DD.  Aortic sclerosis but no stenosis.  Labs/Other Tests and Data Reviewed:    EKG:  No ECG reviewed.  Recent Labs: 04/12/2018: Hemoglobin 14.0; Platelets 194 05/15/2018: ALT 19; BUN 16; Creatinine, Ser 1.04; Potassium 4.5; Sodium 140   Recent Lipid Panel Lab Results  Component Value Date/Time   CHOL 132 05/15/2018 10:10 AM   TRIG 122 05/15/2018 10:10 AM   HDL 40 05/15/2018 10:10 AM   CHOLHDL 3.3 05/15/2018 10:10 AM   LDLCALC 68 05/15/2018 10:10 AM    Wt Readings from Last 3 Encounters:  09/18/18 187 lb 3.2 oz (84.9 kg)  07/24/18 205 lb (93 kg)  05/15/18 205 lb 3.2 oz (93.1 kg)     Objective:    Vital Signs:  Ht 5\' 9"  (1.753 m)   Wt 187 lb 3.2 oz (84.9 kg)   BMI 27.64 kg/m   BP has been doing well @ PCP. VITAL SIGNS:  reviewed GEN:  no acute distress RESPIRATORY:  Nonlabored NEURO:  A&O x 3.  Answers questions appropriately PSYCH:   normal affect   ASSESSMENT & PLAN:    Problem List Items Addressed This Visit    Obesity (BMI 30-39.9) - Primary (Chronic)   Nonischemic cardiomyopathy (HCC) (Chronic)    Previously his  echo was read as 40 to 45% with global hypokinesis, most recent echo was read as an EF of 45 to 50% with septal dyskinesis related to bundle branch block. Likely nonischemic with no symptoms. Is euvolemic. Remains on no medications with adequate control blood pressure.  As he is doing well, we will continue to hold off on beta-blocker/ARB.      LBBB (left bundle branch block) (Chronic)    Was newly identified last year, but may probably be older than we expected given the discordant septal wall motion on echocardiogram.  This longer probably lead to the reduced EF of 45%.  Otherwise no real structural abnormalities.  Nonischemic cath back in 2004.      Hyperlipidemia with target LDL less than 100 (Chronic)    Lipids checked in February look great.  As far as he can tell, he is only on omega-3 fatty acids plus low-dose rosuvastatin.      Exertional shortness of breath (Chronic)    Relatively stable.  Seems to be doing fine without any notable symptoms.          COVID-19 Education: The signs and symptoms of COVID-19 were discussed with the patient and how to seek care for testing (follow up with PCP or arrange E-visit).   The importance of social distancing was discussed today.  Time:   Today, I have spent 16 minutes with the patient with telehealth technology discussing the above problems.     Medication Adjustments/Labs and Tests Ordered: Current medicines are reviewed at length with the patient today.  Concerns regarding medicines are outlined above.  Medication Instructions:   No changes  Tests Ordered: No orders of the defined types were placed in this encounter.  None  Medication Changes: No orders of the defined types were placed in this encounter.   Disposition:  Follow  up in 1 year(s)    Signed, [name] Hew, MD  09/19/2018 9:51 AM    [facility]

## 2018-09-19 ENCOUNTER — Encounter: Payer: Self-pay | Admitting: Cardiology

## 2018-09-19 NOTE — Assessment & Plan Note (Signed)
Previously his echo was read as 40 to 45% with global hypokinesis, most recent echo was read as an EF of 45 to 50% with septal dyskinesis related to bundle branch block. Likely nonischemic with no symptoms. Is euvolemic. Remains on no medications with adequate control blood pressure.  As he is doing well, we will continue to hold off on beta-blocker/ARB.

## 2018-09-19 NOTE — Assessment & Plan Note (Signed)
Was newly identified last year, but may probably be older than we expected given the discordant septal wall motion on echocardiogram.  This longer probably lead to the reduced EF of 45%.  Otherwise no real structural abnormalities.  Nonischemic cath back in 2004.

## 2018-09-19 NOTE — Assessment & Plan Note (Addendum)
Lipids checked in February look great.  As far as he can tell, he is only on omega-3 fatty acids plus low-dose rosuvastatin.

## 2018-09-19 NOTE — Assessment & Plan Note (Signed)
Relatively stable.  Seems to be doing fine without any notable symptoms.

## 2018-12-01 ENCOUNTER — Other Ambulatory Visit: Payer: Self-pay | Admitting: Family Medicine

## 2018-12-01 DIAGNOSIS — G6289 Other specified polyneuropathies: Secondary | ICD-10-CM

## 2018-12-01 DIAGNOSIS — E785 Hyperlipidemia, unspecified: Secondary | ICD-10-CM

## 2018-12-04 DIAGNOSIS — H02831 Dermatochalasis of right upper eyelid: Secondary | ICD-10-CM | POA: Diagnosis not present

## 2018-12-04 DIAGNOSIS — N400 Enlarged prostate without lower urinary tract symptoms: Secondary | ICD-10-CM | POA: Diagnosis not present

## 2018-12-04 DIAGNOSIS — H02834 Dermatochalasis of left upper eyelid: Secondary | ICD-10-CM | POA: Diagnosis not present

## 2018-12-04 DIAGNOSIS — H2513 Age-related nuclear cataract, bilateral: Secondary | ICD-10-CM | POA: Diagnosis not present

## 2018-12-04 DIAGNOSIS — T1511XA Foreign body in conjunctival sac, right eye, initial encounter: Secondary | ICD-10-CM | POA: Diagnosis not present

## 2018-12-10 DIAGNOSIS — R311 Benign essential microscopic hematuria: Secondary | ICD-10-CM | POA: Diagnosis not present

## 2018-12-10 DIAGNOSIS — N5201 Erectile dysfunction due to arterial insufficiency: Secondary | ICD-10-CM | POA: Diagnosis not present

## 2018-12-10 DIAGNOSIS — N400 Enlarged prostate without lower urinary tract symptoms: Secondary | ICD-10-CM | POA: Diagnosis not present

## 2018-12-10 DIAGNOSIS — R972 Elevated prostate specific antigen [PSA]: Secondary | ICD-10-CM | POA: Diagnosis not present

## 2018-12-19 DIAGNOSIS — R311 Benign essential microscopic hematuria: Secondary | ICD-10-CM | POA: Diagnosis not present

## 2018-12-19 DIAGNOSIS — R3129 Other microscopic hematuria: Secondary | ICD-10-CM | POA: Diagnosis not present

## 2018-12-19 DIAGNOSIS — Z23 Encounter for immunization: Secondary | ICD-10-CM | POA: Diagnosis not present

## 2019-01-10 DIAGNOSIS — N401 Enlarged prostate with lower urinary tract symptoms: Secondary | ICD-10-CM | POA: Diagnosis not present

## 2019-01-10 DIAGNOSIS — R351 Nocturia: Secondary | ICD-10-CM | POA: Diagnosis not present

## 2019-01-10 DIAGNOSIS — R972 Elevated prostate specific antigen [PSA]: Secondary | ICD-10-CM | POA: Diagnosis not present

## 2019-01-10 DIAGNOSIS — R311 Benign essential microscopic hematuria: Secondary | ICD-10-CM | POA: Diagnosis not present

## 2019-02-13 ENCOUNTER — Ambulatory Visit: Payer: Self-pay | Admitting: Family Medicine

## 2019-02-14 ENCOUNTER — Encounter: Payer: Self-pay | Admitting: Family Medicine

## 2019-02-14 ENCOUNTER — Ambulatory Visit (INDEPENDENT_AMBULATORY_CARE_PROVIDER_SITE_OTHER): Payer: Medicare Other | Admitting: Family Medicine

## 2019-02-14 ENCOUNTER — Other Ambulatory Visit: Payer: Self-pay

## 2019-02-14 VITALS — BP 118/67 | HR 80 | Temp 97.6°F | Wt 192.6 lb

## 2019-02-14 DIAGNOSIS — R208 Other disturbances of skin sensation: Secondary | ICD-10-CM

## 2019-02-14 DIAGNOSIS — R200 Anesthesia of skin: Secondary | ICD-10-CM | POA: Diagnosis not present

## 2019-02-14 DIAGNOSIS — R519 Headache, unspecified: Secondary | ICD-10-CM | POA: Diagnosis not present

## 2019-02-14 DIAGNOSIS — M4802 Spinal stenosis, cervical region: Secondary | ICD-10-CM

## 2019-02-14 DIAGNOSIS — R7303 Prediabetes: Secondary | ICD-10-CM | POA: Diagnosis not present

## 2019-02-14 DIAGNOSIS — E785 Hyperlipidemia, unspecified: Secondary | ICD-10-CM

## 2019-02-14 DIAGNOSIS — D1721 Benign lipomatous neoplasm of skin and subcutaneous tissue of right arm: Secondary | ICD-10-CM | POA: Diagnosis not present

## 2019-02-14 MED ORDER — ROSUVASTATIN CALCIUM 10 MG PO TABS: 10.0000 mg | ORAL_TABLET | Freq: Every evening | ORAL | 3 refills | Status: DC

## 2019-02-14 NOTE — Progress Notes (Signed)
Subjective:  Patient ID: [name] [name], male    DOB: 06-Feb-1946  Age: 73 y.o. MRN: [id number]  CC:  Chief Complaint  Patient presents with   Medical Management of Chronic Issues    6 month f/u. swould like to have a referral to see DR Iran Planas [phone] for hand pain (carpal tunnel) issues   Mass    lump under right underarm for over year off/on and need to have this looked at    HPI [name] [name] presents for   Hyperlipidemia: Tolerating Crestor 10 mg daily, omega-3 supplement.  Ran out 2 weeks ago - no refills. (90 with 3 RF ordered). No new side effects on meds.  Lab Results  Component Value Date   CHOL 132 05/15/2018   HDL 40 05/15/2018   LDLCALC 68 05/15/2018   TRIG 122 05/15/2018   CHOLHDL 3.3 05/15/2018   Lab Results  Component Value Date   ALT 19 05/15/2018   AST 18 05/15/2018   ALKPHOS 87 05/15/2018   BILITOT 0.3 05/15/2018   Prediabetes: Discussed in February, then may at telemedicine visit. Anticipate improvement once daily was more active and healing from his recent surgery at that time. Wt Readings from Last 3 Encounters:  02/14/19 192 lb 9.6 oz (87.4 kg)  09/18/18 187 lb 3.2 oz (84.9 kg)  07/24/18 205 lb (93 kg)   Lab Results  Component Value Date   HGBA1C 6.1 (H) 08/15/2018   Polyneuropathy History of cervical spine disease, status post cervical fusion.  Gabapentin for cervical symptoms as well as leg symptoms, continued on 300 mg 1-2 3 times daily as needed, but was using 1 in the morning 2 in the afternoon mostly for leg issues/cramping when discussed in May.   Intermittent dosing as less active. Only taking if more active. #540 with 3 rf on 12/01/18.   Cardiac: History of nonischemic cardiomyopathy, left bundle branch block, followed by Dr. Ellyn Hack with cardiology, appointment June 23.  EF 45 to 50% with septal dyskinesis related bundle branch block.  Thought to be nonischemic with no symptoms, euvolemic and no further meds  given.  Nonischemic cath in 2004.  Carpal tunnel symptoms - hand numbness.  Pain in both hands for ages. History of cervical spinal stenosis.  Both hands numb for years - numbness moves up into both forearms. Wears gloves to use at night - helps some.  No known diagnosis of CTS. Few months ago tried to undergo NCS, but could not tolerate even lowest dose to test. Feels weaks at times. No change in numbness in hands since surgery - no change. No difference with prior gabapentin 1 in the am, 2 in the afternoon.  R hand dominant.   R sided headache Past 3-4 weeks.  Sensation on outside of R ear/scalp feels something running down it.  Happens intermittently few times per day. Slight headache in area for few minutes only. No meds needed. Feels like getting more frequent. No neck/posterior scalp involvement. No rash in affected area. No d/c or pain in ear.   Right axilla mass/lump Few years.  Seems to be decreasing in size.  Not painful.  GF asked that he get it checked.   BPH Urology Dr. Lovena Neighbours, appt 10/15. S/p CT. Started tamsulosin for BPH with LUTS, elevated PSA.    History Patient Active Problem List   Diagnosis Date Noted   S/P cervical spinal fusion 04/20/2018   Paresthesia 01/10/2018   Neck pain 01/10/2018   LBBB (left bundle  branch block) 09/07/2017   Other meniscus derangements, posterior horn of medial meniscus, left knee, insufficency fracture medial tibial plateau 04/07/2017   Closed nondisplaced fracture of lateral condyle of left tibia with routine healing 04/07/2017   Status post arthroscopy of left knee 04/07/2017   Insomnia 09/14/2015   Hyperlipidemia with target LDL less than 100 09/11/2014   Rotator cuff tendinitis 04/28/2014   Obesity (BMI 30-39.9) 12/29/2013   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   Exertional shortness of breath 06/26/2012   Polyp of colon, adenomatous 04/27/2012   Elevated PSA 10/20/2011   Hypertriglyceridemia  07/08/2011   Nonischemic cardiomyopathy ([place]) 09/04/2009   Past Medical History:  Diagnosis Date   Arthritis    BPH (benign prostatic hypertrophy)    Coronary atherosclerosis of native coronary vessel    CATH 2004--  MINIMAL NONOBSTRUCTIVE LAD/  NORMAL EF/    CARDIAC CT  03/2008  NO SIGNIFICANT DISEASE  AND  nlef   Elevated PSA    Eye problem 03/06/2016   [name] retinal detachment left eye, no sx done   History of gout    pt 05-20-2013 states stable    History of melanoma excision    2011-- LEFT UPPER BACK   Hyperlipemia    Hypertriglyceridemia    Left bundle branch block    08/2017   Melanoma ([place]) 2011   back   Nonischemic cardiomyopathy ([place])    09-04-2009  --  2D echo-EF 40-45%, Global Hypokinesis - 09/2017 -> EF 45-50%    Numbness    hands   Personal history of arthritis    Personal history of other diseases of circulatory system    Past Surgical History:  Procedure Laterality Date   ANTERIOR CERVICAL DECOMP/DISCECTOMY FUSION N/A 04/20/2018   Procedure: Anterior Cervical Decompression Fusion - Cervical Three-Cervical Four - Cervical Four-Cervical Five - Cervical Five-Cervical Six;  Surgeon: Eustace Moore, MD;  Location: [facility];  Service: Neurosurgery;  Laterality: N/A;  Anterior Cervical Decompression Fusion - Cervical Three-Cervical Four - Cervical Four-Cervical Five - Cervical Five-Cervical Six   APPENDECTOMY  AS CHILD   BACK SURGERY     CARDIAC CATHETERIZATION  05/23/2002  &  05-23-1998   DR AL LITTLE   minimal irregularities in the LAD/  EF 50%   CARDIOVASCULAR STRESS TEST  10-05-1998   MILD GLOBAL HYPOKINESIS AND ISCHEMIAIN ANTEROSEPTAL  AT [facility]/ EF 42%   EXCISION MELANOMA LEFT UPPER BACK  10-14-2009   eye lid surgery Bilateral 2018   KNEE ARTHROSCOPY WITH SUBCHONDROPLASTY Left 04/07/2017   Procedure: LEFT KNEE ARTHROSCOPY WITH PARTIAL MEDIAL MENISCECTOMY AND MEDIAL TIBIAL SUBCHONDROPLASTY;  Surgeon: Mcarthur Rossetti, MD;  Location: [facility];  Service: Orthopedics;  Laterality: Left;   knee injections Bilateral    LUMBAR DISC SURGERY  09-12-2000   left  L5 -- S1   PROSTATE BIOPSY N/A 05/22/2013   Procedure: BIOPSY TRANSRECTAL ULTRASONIC PROSTATE (TUBP);  Surgeon: [name], MD;  Location: [facility];  Service: Urology;  Laterality: N/A;   ROTATOR CUFF REPAIR Right 2016   THROAT SURGERY  04/20/2018   TRANSRECTAL ULTRASOUND PROSTATE BX  05-23-2005  &  04-19-2001   TRANSTHORACIC ECHOCARDIOGRAM  09/2017   EF 45-50 % (previously reported as 40 to 45%).  Incoordinate septal motion with mild LVH.  GR 1 DD.  Aortic sclerosis but no stenosis.   Allergies  Allergen Reactions   Nitroglycerin Anaphylaxis    "Cardiologist suggested he shouldn't take this med because it could kill him  with his heart condition. Lowers BP"   Ace Inhibitors Swelling   Lovastatin Nausea Only   Solarcaine [Benzocaine] Dermatitis   Prior to Admission medications   Medication Sig Start Date End Date Taking? Authorizing Provider  aspirin EC 81 MG tablet Take 1 tablet (81 mg total) by mouth daily. 04/26/18  Yes Costella, Vista Mink, PA-C  gabapentin (NEURONTIN) 300 MG capsule TAKE 1 TO 2 CAPSULES BY  MOUTH 3 TIMES DAILY AS  NEEDED FOR PAIN. 12/01/18  Yes [name], MD  Garlic [id number] MG CAPS Take 1,000 mg by mouth daily.   Yes [provider]  Ginger, Zingiber officinalis, (GINGER ROOT) 550 MG CAPS Take 550 mg by mouth daily.   Yes [provider]  magnesium oxide (MAG-OX) 400 MG tablet Take 400 mg by mouth daily.   Yes [provider]  Multiple Vitamin (MULTIVITAMIN WITH MINERALS) TABS tablet Take 1 tablet by mouth daily. One-A-Day Multivitamin   Yes [provider]  Omega-3 Fatty Acids (FISH OIL) 500 MG CAPS Take 500 mg by mouth daily.   Yes [provider]  polyvinyl alcohol (LIQUIFILM TEARS) 1.4 % ophthalmic solution Place 1 drop into both eyes 3 (three) times daily as needed for  dry eyes.    Yes [provider]  Pseudoephedrine-APAP-DM (DAYQUIL MULTI-SYMPTOM PO) Take 2 capsules by mouth as needed (cold symptoms).   Yes [provider]  rosuvastatin (CRESTOR) 10 MG tablet TAKE 1 TABLET BY MOUTH  EVERY EVENING 12/01/18  Yes [name], MD  sildenafil (VIAGRA) 100 MG tablet 1/2 to 1 tablet up to each day as needed. Patient taking differently: Take 50-100 mg by mouth daily as needed for erectile dysfunction.  07/08/11  Yes [name], MD  tamsulosin (FLOMAX) 0.4 MG CAPS capsule Take 0.4 mg by mouth.   Yes [provider]  Turmeric 500 MG TABS Take 500 mg by mouth daily.   Yes [provider]  vitamin B-12 (CYANOCOBALAMIN) 500 MCG tablet Take 500 mcg by mouth daily.   Yes [provider]  Zinc 50 MG TABS Take 50 mg by mouth daily.   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  Social Designer, fashion/clothing strain: Not on file   Food insecurity    Worry: Not on file    Inability: Not on file   Transportation needs    Medical: Not on file    Non-medical: Not on file  Tobacco Use   Smoking status: Former Smoker    Packs/day: 2.00    Years: 20.00    Pack years: 40.00    Types: Cigarettes    Quit date: 03/28/1980    Years since quitting: 38.9   Smokeless tobacco: Never Used  Substance and Sexual Activity   Alcohol use: No    Alcohol/week: 0.0 standard drinks   Drug use: No   Sexual activity: Not on file  Lifestyle   Physical activity    Days per week: Not on file    Minutes per session: Not on file   Stress: Not on file  Relationships   Social connections    Talks on phone: Not on file  Gets  together: Not on file    Attends religious service: Not on file    Active member of club or organization: Not on file    Attends meetings of clubs or organizations: Not on file    Relationship status: Not on file   Intimate partner violence    Fear of current or ex partner: Not on file    Emotionally abused: Not on file    Physically abused: Not on file    Forced sexual activity: Not on file  Other Topics Concern   Not on file  Social History Narrative   He walks on a relatively regular basis about 15 minutes at a time mostly to the discomfort. He previously had walked up a 30-40 minutes a time without problems. Education: [employer].   Lives alone.   Right-handed.   One cup coffee daily.    Review of Systems   Objective:   Vitals:   02/14/19 1028  BP: 118/67  Pulse: 80  Temp: 97.6 F (36.4 C)  TempSrc: Oral  SpO2: 98%  Weight: 192 lb 9.6 oz (87.4 kg)     Physical Exam Vitals signs reviewed.  Constitutional:      Appearance: He is well-developed.  HENT:     Head: Normocephalic and atraumatic.   Eyes:     Pupils: Pupils are equal, round, and reactive to light.  Neck:     Vascular: No carotid bruit or JVD.  Cardiovascular:     Rate and Rhythm: Normal rate and regular rhythm.     Heart sounds: Normal heart sounds. No murmur.  Pulmonary:     Effort: Pulmonary effort is normal.     Breath sounds: Normal breath sounds. No rales.  Musculoskeletal:       Arms:     Comments: Negative tinel on R wrist, reports tingling into dorsal thumb with tinel and phalen on left.  Reports intact sensation to the distal fingertips bilaterally, cap refill less than 1 second, normal radial pulses.  Skin:    General: Skin is warm and dry.  Neurological:     Mental Status: He is alert and oriented to person, place, and time.     Over 40 minutes total care.    Assessment & Plan:  [name] [name] is a 73 y.o. male . Bilateral hand numbness - Plan: Ambulatory referral to  Hand Surgery Spinal stenosis in cervical region - Plan: Ambulatory referral to Hand Surgery Right-sided headache - Plan: Ambulatory referral to Neurology Dysesthesia of scalp - Plan: Ambulatory referral to Neurology  -Known cervical stenosis, status post surgery as above.  May have some residual dysesthesias.  Unable to tolerate nerve conduction study by report.   -Option of gabapentin discussed as well as potential adjustment of doses.  -Refer to hand surgery to discuss hand symptoms but less likely carpal tunnel.   -Right-sided headache/dysesthesia may also be related to cervical spine, but differential includes occipital neuralgia.  Nonfocal neuro exam.  Refer to neurology to evaluate further.  Hyperlipidemia, unspecified hyperlipidemia type - Plan: rosuvastatin (CRESTOR) 10 MG tablet, Lipid Panel, Comprehensive metabolic panel  -Tolerating Crestor, continue same.  Prediabetes - Plan: Hemoglobin A1c  -Recheck A1c, monitor diet/exercise.  Lipoma of right axilla  -Reported decrease in size.  Possible lipoma.  Did recommend meeting with his dermatologist to evaluate that area as well or could consider general surgery eval for excision.  No orders of the defined types were placed in this encounter.  Patient Instructions  Numbness in hands/arms may still be related to your neck issues. I would recommend discussing treatment options with your neurosurgeon if gabapentin was not effective. You do have an option of trying higher doses of gabapentin to see if that may help your symptoms.  I will also refer you to Dr. Caralyn Guile to evaluate the hand symptoms as well.   I will refer you to a neurologist or headache specialist to evaluate the symptoms on the right side of your scalp.  I do not see any concerns on exam at this time.  Area underneath your armpit could be a lipoma or a benign growth.  I would recommend scheduling an appointment with your dermatologist to evaluate that area further,  but encouraged if it is getting smaller.  Continue to watch diet, exercise for prediabetes.  I will check that level again today.  No change in cholesterol medications for now.  If cholesterol levels were elevated, that may be due to past 2 weeks off medication so I will likely keep dosage the same.  Thank you for coming in today and let me know if there are questions.   If you have lab work done today you will be contacted with your lab results within the next 2 weeks.  If you have not heard from [place] then please contact us. The fastest way to get your results is to register for My Chart.   IF you received an x-ray today, you will receive an invoice from [facility]. Please contact [facility] at [phone] with questions or concerns regarding your invoice.   IF you received labwork today, you will receive an invoice from [facility]. Please contact [facility] at [phone] with questions or concerns regarding your invoice.   Our billing staff will not be able to assist you with questions regarding bills from these companies.  You will be contacted with the lab results as soon as they are available. The fastest way to get your results is to activate your My Chart account. Instructions are located on the last page of this paperwork. If you have not heard from [place] regarding the results in 2 weeks, please contact this office.          Signed, [name], MD [facility]

## 2019-02-14 NOTE — Patient Instructions (Addendum)
   Numbness in hands/arms may still be related to your neck issues. I would recommend discussing treatment options with your neurosurgeon if gabapentin was not effective. You do have an option of trying higher doses of gabapentin to see if that may help your symptoms.  I will also refer you to Dr. Caralyn Guile to evaluate the hand symptoms as well.   I will refer you to a neurologist or headache specialist to evaluate the symptoms on the right side of your scalp.  I do not see any concerns on exam at this time.  Area underneath your armpit could be a lipoma or a benign growth.  I would recommend scheduling an appointment with your dermatologist to evaluate that area further, but encouraged if it is getting smaller.  Continue to watch diet, exercise for prediabetes.  I will check that level again today.  No change in cholesterol medications for now.  If cholesterol levels were elevated, that may be due to past 2 weeks off medication so I will likely keep dosage the same.  Thank you for coming in today and let me know if there are questions.   If you have lab work done today you will be contacted with your lab results within the next 2 weeks.  If you have not heard from [place] then please contact us. The fastest way to get your results is to register for My Chart.   IF you received an x-ray today, you will receive an invoice from [facility]. Please contact [facility] at [phone] with questions or concerns regarding your invoice.   IF you received labwork today, you will receive an invoice from [place]. Please contact [facility] at [phone] with questions or concerns regarding your invoice.   Our billing staff will not be able to assist you with questions regarding bills from these companies.  You will be contacted with the lab results as soon as they are available. The fastest way to get your results is to activate your My Chart account. Instructions are located on the last  page of this paperwork. If you have not heard from [place] regarding the results in 2 weeks, please contact this office.

## 2019-02-15 LAB — COMPREHENSIVE METABOLIC PANEL
ALT: 15 IU/L (ref 0–44)
AST: 16 IU/L (ref 0–40)
Albumin/Globulin Ratio: 1.8 (ref 1.2–2.2)
Albumin: 4.8 g/dL — ABNORMAL HIGH (ref 3.7–4.7)
Alkaline Phosphatase: 73 IU/L (ref 39–117)
BUN/Creatinine Ratio: 20 (ref 10–24)
BUN: 20 mg/dL (ref 8–27)
Bilirubin Total: 0.4 mg/dL (ref 0.0–1.2)
CO2: 21 mmol/L (ref 20–29)
Calcium: 9.5 mg/dL (ref 8.6–10.2)
Chloride: 102 mmol/L (ref 96–106)
Creatinine, Ser: 1.01 mg/dL (ref 0.76–1.27)
GFR calc Af Amer: 84 mL/min/{1.73_m2} (ref >59)
GFR calc non Af Amer: 73 mL/min/{1.73_m2} (ref >59)
Globulin, Total: 2.6 g/dL (ref 1.5–4.5)
Glucose: 91 mg/dL (ref 65–99)
Potassium: 4.5 mmol/L (ref 3.5–5.2)
Sodium: 140 mmol/L (ref 134–144)
Total Protein: 7.4 g/dL (ref 6.0–8.5)

## 2019-02-15 LAB — LIPID PANEL
Chol/HDL Ratio: 5.4 ratio — ABNORMAL HIGH (ref 0.0–5.0)
Cholesterol, Total: 210 mg/dL — ABNORMAL HIGH (ref 100–199)
HDL: 39 mg/dL — ABNORMAL LOW (ref >39)
LDL Chol Calc (NIH): 138 mg/dL — ABNORMAL HIGH (ref 0–99)
Triglycerides: 182 mg/dL — ABNORMAL HIGH (ref 0–149)
VLDL Cholesterol Cal: 33 mg/dL (ref 5–40)

## 2019-02-15 LAB — HEMOGLOBIN A1C
Est. average glucose Bld gHb Est-mCnc: 128 mg/dL
Hgb A1c MFr Bld: 6.1 % — ABNORMAL HIGH (ref 4.8–5.6)

## 2019-02-17 ENCOUNTER — Encounter: Payer: Self-pay | Admitting: Family Medicine

## 2019-03-07 DIAGNOSIS — G5603 Carpal tunnel syndrome, bilateral upper limbs: Secondary | ICD-10-CM | POA: Diagnosis not present

## 2019-03-20 ENCOUNTER — Telehealth: Payer: Self-pay | Admitting: Family Medicine

## 2019-03-20 ENCOUNTER — Encounter: Payer: Self-pay | Admitting: Emergency Medicine

## 2019-03-20 NOTE — Progress Notes (Signed)
Letter has been drafted and placed in outgoing mail

## 2019-03-20 NOTE — Telephone Encounter (Signed)
Pt wants his lab results mailed to him. His results are posted to his MyChart, patient states he does not have Internet and does not used mychart.

## 2019-03-20 NOTE — Telephone Encounter (Signed)
Letter has been sent to pt

## 2019-04-04 DIAGNOSIS — G5603 Carpal tunnel syndrome, bilateral upper limbs: Secondary | ICD-10-CM | POA: Diagnosis not present

## 2019-04-11 ENCOUNTER — Institutional Professional Consult (permissible substitution): Payer: Medicare Other | Admitting: Neurology

## 2019-04-11 ENCOUNTER — Other Ambulatory Visit: Payer: Self-pay | Admitting: Physician Assistant

## 2019-04-11 DIAGNOSIS — R2232 Localized swelling, mass and lump, left upper limb: Secondary | ICD-10-CM

## 2019-04-17 DIAGNOSIS — G5601 Carpal tunnel syndrome, right upper limb: Secondary | ICD-10-CM | POA: Diagnosis not present

## 2019-04-23 ENCOUNTER — Other Ambulatory Visit: Payer: Self-pay | Admitting: Physician Assistant

## 2019-04-23 ENCOUNTER — Ambulatory Visit
Admission: RE | Admit: 2019-04-23 | Discharge: 2019-04-23 | Disposition: A | Discharge status: Home / Self Care | Payer: Medicare Other | Source: Ambulatory Visit | Attending: Physician Assistant | Admitting: Physician Assistant

## 2019-04-23 ENCOUNTER — Other Ambulatory Visit: Payer: Self-pay

## 2019-04-23 DIAGNOSIS — R2232 Localized swelling, mass and lump, left upper limb: Secondary | ICD-10-CM

## 2019-04-23 DIAGNOSIS — R2231 Localized swelling, mass and lump, right upper limb: Secondary | ICD-10-CM

## 2019-04-23 DIAGNOSIS — M7989 Other specified soft tissue disorders: Secondary | ICD-10-CM | POA: Diagnosis not present

## 2019-05-09 ENCOUNTER — Other Ambulatory Visit: Payer: Self-pay

## 2019-05-09 ENCOUNTER — Ambulatory Visit (INDEPENDENT_AMBULATORY_CARE_PROVIDER_SITE_OTHER): Payer: Medicare Other | Admitting: Neurology

## 2019-05-09 ENCOUNTER — Encounter: Payer: Self-pay | Admitting: Neurology

## 2019-05-09 VITALS — BP 126/67 | HR 82 | Temp 97.8°F | Ht 69.0 in | Wt 198.5 lb

## 2019-05-09 DIAGNOSIS — M542 Cervicalgia: Secondary | ICD-10-CM | POA: Diagnosis not present

## 2019-05-09 DIAGNOSIS — R202 Paresthesia of skin: Secondary | ICD-10-CM

## 2019-05-09 NOTE — Progress Notes (Signed)
PATIENT: [name] [name] DOB: Jul 10, 1945  Chief Complaint  Patient presents with  . Pain    Previously seen in 12/2017 for paresthesia. Reports cervical pain along with cracking when turning head side to side.  Also, having intermittent discomfort on the right-side of head that feels like someone is rubbing their hands in the area.   Marland Kitchen PCP    [name], MD     HISTORICAL  [name], seen in request by  I have reviewed and summarized the referring note from the referring physician. Oct 2019  [name] [name] is a 74 years old male, seen in request by his primary care physician Dr. [name] for evaluation of numbness, initial evaluation was on Jan 10 2018.  He is accompanied by his friend [name] [name] at today's visit.  I have reviewed and summarized the referring note from the referring physician.  He has past medical history of hyperlipidemia, lumbar decompression, left shoulder and knee surgery in the past,  He complains of intermittent bilateral arm paresthesia from elbow down since 2014, it is getting worse, sometimes triggered by lying on his shoulder, after cutting foods, becomes deep achy pain, numbness, he also complains of intermittent neck pain, especially when turning his neck  He also has multiple joints pain, left knee, left shoulder, left foot, had a history of left lumbar radiculopathy, previous lumbar decompression surgery, which has helped his symptoms, but he still has frequent left calf muscle cramping, intermittent left foot paresthesia, recently flareup of left knee pain, limited response to left knee injection,  Laboratory evaluation in 2019 showed normal or negative TSH, B12, CMP, A1c was 6.0, lipid profile showed LDL of 126, triglyceride was 200  UPDATE May 09 2019: He had MRI of cervical spine in October 2019, showed severe cyst at the C3-4, with spinal cord compression, no cord signal abnormality, there is evidence of mild cord edema,  gliosis  He underwent Decompressive anterior cervical discectomy C3-4 C4-5 C5-6, 2. Anterior cervical arthrodesisC3-4 C4-5 C5-6utilizing a porous titaniuminterbody cage packed with locally harvested morcellized autologous bone graft, 3. Anterior cervical plating C3-C6 inclusiveutilizing a ATECplate by Dr. Sherley Bounds in November 2020  He denies significant improvement of his bilateral hands paresthesia, was recently seen by orthopedic surgeon, had right carpal tunnel release surgery, which has helped his right hand paresthesia, is having pending surgery for left side, he reported he was hypersensitive to electric stimulation, was not able to tolerate nerve conduction study.  Today he came in with 6 months history of right neck pain, radiating pain to right parietal occipital region, lasting for few seconds, sometimes followed by a nagging pain at the right frontal temporal region, lasting for few hours  He also complains of limited range of motion of his neck muscles, denies gait abnormality,   REVIEW OF SYSTEMS: Full 14 system review of systems performed and notable only for as above All other review of systems were negative.  ALLERGIES: Allergies  Allergen Reactions  . Nitroglycerin Anaphylaxis    "Cardiologist suggested he shouldn't take this med because it could kill him with his heart condition. Lowers BP"  . Ace Inhibitors Swelling  . Lovastatin Nausea Only  . Solarcaine [Benzocaine] Dermatitis    HOME MEDICATIONS: Current Outpatient Medications  Medication Sig Dispense Refill  . aspirin EC 81 MG tablet Take 1 tablet (81 mg total) by mouth daily.    Marland Kitchen gabapentin (NEURONTIN) 300 MG capsule TAKE 1 TO 2 CAPSULES BY  MOUTH  3 TIMES DAILY AS  NEEDED FOR PAIN. 540 capsule 3  . Garlic [id number] MG CAPS Take 1,000 mg by mouth daily.    . Ginger, Zingiber officinalis, (GINGER ROOT) 550 MG CAPS Take 550 mg by mouth daily.    . magnesium oxide (MAG-OX) 400 MG tablet Take 400 mg by mouth  daily.    . Multiple Vitamin (MULTIVITAMIN WITH MINERALS) TABS tablet Take 1 tablet by mouth daily. One-A-Day Multivitamin    . Omega-3 Fatty Acids (FISH OIL) 500 MG CAPS Take 500 mg by mouth daily.    . polyvinyl alcohol (LIQUIFILM TEARS) 1.4 % ophthalmic solution Place 1 drop into both eyes 3 (three) times daily as needed for dry eyes.     . Pseudoephedrine-APAP-DM (DAYQUIL MULTI-SYMPTOM PO) Take 2 capsules by mouth as needed (cold symptoms).    . rosuvastatin (CRESTOR) 10 MG tablet Take 1 tablet (10 mg total) by mouth every evening. 90 tablet 3  . sildenafil (VIAGRA) 100 MG tablet 1/2 to 1 tablet up to each day as needed. (Patient taking differently: Take 50-100 mg by mouth daily as needed for erectile dysfunction. ) 10 tablet 5  . tamsulosin (FLOMAX) 0.4 MG CAPS capsule Take 0.4 mg by mouth.    . Turmeric 500 MG TABS Take 500 mg by mouth daily.    . vitamin B-12 (CYANOCOBALAMIN) 500 MCG tablet Take 500 mcg by mouth daily.    . Zinc 50 MG TABS Take 50 mg by mouth daily.     No current facility-administered medications for this visit.    PAST MEDICAL HISTORY: Past Medical History:  Diagnosis Date  . Arthritis   . BPH (benign prostatic hypertrophy)   . Coronary atherosclerosis of native coronary vessel    CATH 2004--  MINIMAL NONOBSTRUCTIVE LAD/  NORMAL EF/    CARDIAC CT  03/2008  NO SIGNIFICANT DISEASE  AND  nlef  . Elevated PSA   . Eye problem 03/06/2016   [name] retinal detachment left eye, no sx done  . Headache   . History of gout    pt 05-20-2013 states stable   . History of melanoma excision    2011-- LEFT UPPER BACK  . Hyperlipemia   . Hypertriglyceridemia   . Left bundle branch block    08/2017  . Melanoma ([place]) 2011   back  . Nonischemic cardiomyopathy ([place])    09-04-2009  --  2D echo-EF 40-45%, Global Hypokinesis - 09/2017 -> EF 45-50%   . Numbness    hands  . Personal history of arthritis   . Personal history of other diseases of circulatory system     PAST  SURGICAL HISTORY: Past Surgical History:  Procedure Laterality Date  . ANTERIOR CERVICAL DECOMP/DISCECTOMY FUSION N/A 04/20/2018   Procedure: Anterior Cervical Decompression Fusion - Cervical Three-Cervical Four - Cervical Four-Cervical Five - Cervical Five-Cervical Six;  Surgeon: Eustace Moore, MD;  Location: [facility];  Service: Neurosurgery;  Laterality: N/A;  Anterior Cervical Decompression Fusion - Cervical Three-Cervical Four - Cervical Four-Cervical Five - Cervical Five-Cervical Six  . APPENDECTOMY  AS CHILD  . BACK SURGERY    . CARDIAC CATHETERIZATION  05/23/2002  &  05-23-1998   DR AL LITTLE   minimal irregularities in the LAD/  EF 50%  . CARDIOVASCULAR STRESS TEST  10-05-1998   MILD GLOBAL HYPOKINESIS AND ISCHEMIAIN ANTEROSEPTAL  AT [facility]/ EF 42%  . CARPAL TUNNEL RELEASE Right   . EXCISION MELANOMA LEFT UPPER BACK  10-14-2009  . eye lid surgery  Bilateral 2018  . KNEE ARTHROSCOPY WITH SUBCHONDROPLASTY Left 04/07/2017   Procedure: LEFT KNEE ARTHROSCOPY WITH PARTIAL MEDIAL MENISCECTOMY AND MEDIAL TIBIAL SUBCHONDROPLASTY;  Surgeon: Mcarthur Rossetti, MD;  Location: [facility];  Service: Orthopedics;  Laterality: Left;  . knee injections Bilateral   . LUMBAR DISC SURGERY  09-12-2000   left  L5 -- S1  . PROSTATE BIOPSY N/A 05/22/2013   Procedure: BIOPSY TRANSRECTAL ULTRASONIC PROSTATE (TUBP);  Surgeon: [name], MD;  Location: [facility];  Service: Urology;  Laterality: N/A;  . ROTATOR CUFF REPAIR Right 2016  . THROAT SURGERY  04/20/2018  . TRANSRECTAL ULTRASOUND PROSTATE BX  05-23-2005  &  04-19-2001  . TRANSTHORACIC ECHOCARDIOGRAM  09/2017   EF 45-50 % (previously reported as 40 to 45%).  Incoordinate septal motion with mild LVH.  GR 1 DD.  Aortic sclerosis but no stenosis.    FAMILY HISTORY: Family History  Problem Relation Age of Onset  . Arthritis Mother   . COPD Father   . Cancer Father        lung cancer  . Cancer Sister   . Cardiomyopathy Other         idiopathic. trivial disease 2004 cath, 2010 cardiac CT - no sig. dz, nl LV fxn. cards: Little  . Colon cancer Neg Hx     SOCIAL HISTORY: Social History   Socioeconomic History  . Marital status: Widowed    Spouse name: Not on file  . Number of children: 2  . Years of education: 59  . Highest education level: High school graduate  Occupational History  . 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  Tobacco Use  . Smoking status: Former Smoker    Packs/day: 2.00    Years: 20.00    Pack years: 40.00    Types: Cigarettes    Quit date: 03/28/1980    Years since quitting: 39.1  . Smokeless tobacco: Never Used  Substance and Sexual Activity  . Alcohol use: No    Alcohol/week: 0.0 standard drinks  . Drug use: No  . Sexual activity: Not on file  Other Topics Concern  . Not on file  Social History Narrative   He walks on a relatively regular basis about 15 minutes at a time mostly to the discomfort. He previously had walked up a 30-40 minutes a time without problems. Education: [employer].   Lives alone.   Right-handed.   One cup coffee daily.   Social Determinants of Health   Financial Resource Strain:   . Difficulty of Paying Living Expenses: Not on file  Food Insecurity:   . Worried About Charity fundraiser in the Last Year: Not on file  . Ran Out of Food in the Last Year: Not on file  Transportation Needs:   . Lack of Transportation (Medical): Not on file  . Lack of Transportation (Non-Medical): Not on file  Physical Activity:   . Days of Exercise per Week: Not on file  . Minutes of Exercise per Session: Not on file  Stress:   . Feeling of Stress : Not on file  Social Connections:   . Frequency of Communication with Friends and Family: Not on file  . Frequency of Social Gatherings with Friends and Family: Not on file  . Attends Religious Services: Not on file  . Active Member of Clubs or Organizations: Not on file  . Attends English as a second language teacher Meetings: Not on file  .  Marital Status: Not on file  Intimate Partner Violence:   . Fear of Current or Ex-Partner: Not on file  . Emotionally Abused: Not on file  . Physically Abused: Not on file  . Sexually Abused: Not on file     PHYSICAL EXAM   Vitals:   05/09/19 1029  BP: 126/67  Pulse: 82  Temp: 97.8 F (36.6 C)  Weight: 198 lb 8 oz (90 kg)  Height: 5\' 9"  (1.753 m)    Not recorded      Body mass index is 29.31 kg/m.  PHYSICAL EXAMNIATION:  Gen: NAD, conversant, well nourised, well groomed                     Cardiovascular: Regular rate rhythm, no peripheral edema, warm, nontender. Eyes: Conjunctivae clear without exudates or hemorrhage Neck:  no carotid bruits.  Limited range of neck movement, especially turning to the right Pulmonary: Clear to auscultation bilaterally  Musculoskeletal: Tenderness of right nuchal area upon deep palpitation,  NEUROLOGICAL EXAM:  MENTAL STATUS: Speech:    Speech is normal; fluent and spontaneous with normal comprehension.  Cognition:     Orientation to time, place and person     Normal recent and remote memory     Normal Attention span and concentration     Normal Language, naming, repeating,spontaneous speech     Fund of knowledge   CRANIAL NERVES: CN II: Visual fields are full to confrontation. Pupils are round equal and briskly reactive to light. CN III, IV, VI: extraocular movement are normal. No ptosis. CN V: Facial sensation is intact to light touch CN VII: Face is symmetric with normal eye closure  CN VIII: Hearing is normal to causal conversation. CN IX, X: Phonation is normal. CN XI: Head turning and shoulder shrug are intact  MOTOR: He has mild bilateral shoulder abduction, external rotation weakness,  REFLEXES: Reflexes are 2+ and symmetric at the biceps, triceps, knees, and ankles. Plantar responses are flexor.  SENSORY: Intact to light touch, pinprick and vibratory sensation are intact  in fingers and toes.  COORDINATION: There is no trunk or limb dysmetria noted.  GAIT/STANCE: Posture is normal. Gait is steady with normal steps, base, arm swing, and turning. Heel and toe walking are normal. Tandem gait is normal.  Romberg is absent.   DIAGNOSTIC DATA (LABS, IMAGING, TESTING) - I reviewed patient records, labs, notes, testing and imaging myself where available.   ASSESSMENT AND PLAN  Jachin Bogusz Rumpf is a 74 y.o. 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 Ph.D.  [facility] [address] [place], [place] [address] Ph: [phone] Fax: [phone]  [id number], Ranell Patrick, MD

## 2019-05-09 NOTE — Progress Notes (Signed)
    History: 74 year old male with history of cervical decompression surgery, presented with persistent right-sided occipital area pain, radiating pain to right parietotemporal region.  Tenderness upon deep palpitation,   Occipital nerve block and trigger point injection  Patient's right occipital area was palpated, the location of greater occipital nerve was identified by drawing a line from external occipital protuberance to mastoid process,    3.0 cc of mixture, 1.5 cc of 0.5% bupivacaine mixed with 1.5 cc of dexamethasone.  Injection site lies 2 cm lateral and 2 cm inferior to the external occipital protuberance. Injection was also performed along the right nuchal line, where he had tenderness upon palpitation  The patient tolerated the injections well, no complications of the procedure were noted. Injections were made with a 27-gauge needle.  Marcial Pacas, M.D. Ph.D.  [facility] [place],  [address] Phone: [phone] Fax:      [phone]

## 2019-07-08 DIAGNOSIS — Z85820 Personal history of malignant melanoma of skin: Secondary | ICD-10-CM | POA: Diagnosis not present

## 2019-07-08 DIAGNOSIS — L821 Other seborrheic keratosis: Secondary | ICD-10-CM | POA: Diagnosis not present

## 2019-07-08 DIAGNOSIS — D225 Melanocytic nevi of trunk: Secondary | ICD-10-CM | POA: Diagnosis not present

## 2019-07-08 DIAGNOSIS — L820 Inflamed seborrheic keratosis: Secondary | ICD-10-CM | POA: Diagnosis not present

## 2019-07-08 DIAGNOSIS — L814 Other melanin hyperpigmentation: Secondary | ICD-10-CM | POA: Diagnosis not present

## 2019-07-08 DIAGNOSIS — R208 Other disturbances of skin sensation: Secondary | ICD-10-CM | POA: Diagnosis not present

## 2019-07-08 DIAGNOSIS — L905 Scar conditions and fibrosis of skin: Secondary | ICD-10-CM | POA: Diagnosis not present

## 2019-07-08 DIAGNOSIS — D1801 Hemangioma of skin and subcutaneous tissue: Secondary | ICD-10-CM | POA: Diagnosis not present

## 2019-07-08 DIAGNOSIS — Z872 Personal history of diseases of the skin and subcutaneous tissue: Secondary | ICD-10-CM | POA: Diagnosis not present

## 2019-08-06 ENCOUNTER — Telehealth: Payer: Self-pay | Admitting: *Deleted

## 2019-08-06 NOTE — Telephone Encounter (Signed)
Schedule AWV.  

## 2019-08-07 DIAGNOSIS — H02831 Dermatochalasis of right upper eyelid: Secondary | ICD-10-CM | POA: Diagnosis not present

## 2019-08-07 DIAGNOSIS — D3132 Benign neoplasm of left choroid: Secondary | ICD-10-CM | POA: Diagnosis not present

## 2019-08-07 DIAGNOSIS — H02834 Dermatochalasis of left upper eyelid: Secondary | ICD-10-CM | POA: Diagnosis not present

## 2019-08-07 DIAGNOSIS — H2513 Age-related nuclear cataract, bilateral: Secondary | ICD-10-CM | POA: Diagnosis not present

## 2019-08-07 DIAGNOSIS — H43812 Vitreous degeneration, left eye: Secondary | ICD-10-CM | POA: Diagnosis not present

## 2019-08-08 ENCOUNTER — Ambulatory Visit (INDEPENDENT_AMBULATORY_CARE_PROVIDER_SITE_OTHER): Payer: Medicare Other | Admitting: Family Medicine

## 2019-08-08 VITALS — BP 126/67 | Ht 69.0 in | Wt 198.0 lb

## 2019-08-08 DIAGNOSIS — Z0000 Encounter for general adult medical examination without abnormal findings: Secondary | ICD-10-CM

## 2019-08-08 NOTE — Progress Notes (Signed)
Presents today for [employer] Visit   Date of last exam: 02/14/2019  Interpreter used for this visit? No  I connected with  [name] [name] on 08/08/19 by a telephone and verified that I am speaking with the correct person using two identifiers.   I discussed the limitations of evaluation and management by telemedicine. The patient expressed understanding and agreed to proceed.    Patient Care Team: [name], MD as PCP - General (Family Medicine) Leonie Man, MD as PCP - Cardiology (Cardiology) Rana Snare, MD (Urology) Milus Banister, MD (Gastroenterology) Fulton Reek, MD as Attending Physician (Cardiology)   Other items to address today:   Discussed immunizations Discussed Eye/ Dental 6th month follow up scheduled 5/20 @ 9;20 am    Other Screening: Last screening for diabetes: 02/14/2019 Last lipid screening: 02-14-2019  ADVANCE DIRECTIVES: Discussed: yes On File: no Materials Provided: no  Immunization status:  Immunization History  Administered Date(s) Administered  . Hepatitis B 01/14/2011  . Influenza Split 01/14/2011, 01/18/2012  . Influenza, High Dose Seasonal PF 01/10/2018  . Influenza,inj,Quad PF,6+ Mos 02/04/2013, 12/26/2013, 01/23/2015, 01/22/2016, 12/16/2016  . PFIZER SARS-COV-2 Vaccination 04/30/2019, 05/22/2019  . Pneumococcal Conjugate-13 05/06/2014  . Pneumococcal Polysaccharide-23 03/28/2004, 04/29/2009  . Tdap 08/11/2009  . Zoster 06/11/2013     There are no preventive care reminders to display for this patient.   Functional Status Survey: Is the patient deaf or have difficulty hearing?: No Does the patient have difficulty seeing, even when wearing glasses/contacts?: No Does the patient have difficulty concentrating, remembering, or making decisions?: No Does the patient have difficulty walking or climbing stairs?: No Does the patient have difficulty dressing or bathing?: No Does the  patient have difficulty doing errands alone such as visiting a doctor's office or shopping?: No   6CIT Screen 08/08/2019 08/08/2019 07/24/2018  What Year? 0 points 0 points 0 points  What month? 0 points 0 points 0 points  What time? 0 points 0 points 0 points  Count back from 20 0 points 0 points 0 points  Months in reverse 4 points 0 points 0 points  Repeat phrase 2 points 0 points 0 points  Total Score 6 0 0        Clinical Support from 08/08/2019 in [facility]  AUDIT-C Score  0       Home Environment:   Lives on a [place] lives 4 story home No trouble climbing stairs No scattered rugs ( no grabs) No grab bars Adequate lighting/ no clutter   Patient Active Problem List   Diagnosis Date Noted  . S/P cervical spinal fusion 04/20/2018  . Paresthesia 01/10/2018  . Neck pain 01/10/2018  . LBBB (left bundle branch block) 09/07/2017  . Other meniscus derangements, posterior horn of medial meniscus, left knee, insufficency fracture medial tibial plateau 04/07/2017  . Closed nondisplaced fracture of lateral condyle of left tibia with routine healing 04/07/2017  . Status post arthroscopy of left knee 04/07/2017  . Insomnia 09/14/2015  . Hyperlipidemia with target LDL less than 100 09/11/2014  . Rotator cuff tendinitis 04/28/2014  . Obesity (BMI 30-39.9) 12/29/2013  . 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  . Exertional shortness of breath 06/26/2012  . Polyp of colon, adenomatous 04/27/2012  . Elevated PSA 10/20/2011  . Hypertriglyceridemia 07/08/2011  . Nonischemic cardiomyopathy ([place]) 09/04/2009     Past Medical History:  Diagnosis Date  . Arthritis   . BPH (benign prostatic hypertrophy)   .  Coronary atherosclerosis of native coronary vessel    CATH 2004--  MINIMAL NONOBSTRUCTIVE LAD/  NORMAL EF/    CARDIAC CT  03/2008  NO SIGNIFICANT DISEASE  AND  nlef  . Elevated PSA   . Eye problem 03/06/2016   [name] retinal detachment left eye, no sx done  .  Headache   . History of gout    pt 05-20-2013 states stable   . History of melanoma excision    2011-- LEFT UPPER BACK  . Hyperlipemia   . Hypertriglyceridemia   . Left bundle branch block    08/2017  . Melanoma ([place]) 2011   back  . Nonischemic cardiomyopathy ([place])    09-04-2009  --  2D echo-EF 40-45%, Global Hypokinesis - 09/2017 -> EF 45-50%   . Numbness    hands  . Personal history of arthritis   . Personal history of other diseases of circulatory system      Past Surgical History:  Procedure Laterality Date  . ANTERIOR CERVICAL DECOMP/DISCECTOMY FUSION N/A 04/20/2018   Procedure: Anterior Cervical Decompression Fusion - Cervical Three-Cervical Four - Cervical Four-Cervical Five - Cervical Five-Cervical Six;  Surgeon: Eustace Moore, MD;  Location: [facility];  Service: Neurosurgery;  Laterality: N/A;  Anterior Cervical Decompression Fusion - Cervical Three-Cervical Four - Cervical Four-Cervical Five - Cervical Five-Cervical Six  . APPENDECTOMY  AS CHILD  . BACK SURGERY    . CARDIAC CATHETERIZATION  05/23/2002  &  05-23-1998   DR AL LITTLE   minimal irregularities in the LAD/  EF 50%  . CARDIOVASCULAR STRESS TEST  10-05-1998   MILD GLOBAL HYPOKINESIS AND ISCHEMIAIN ANTEROSEPTAL  AT [facility]/ EF 42%  . CARPAL TUNNEL RELEASE Right   . EXCISION MELANOMA LEFT UPPER BACK  10-14-2009  . eye lid surgery Bilateral 2018  . KNEE ARTHROSCOPY WITH SUBCHONDROPLASTY Left 04/07/2017   Procedure: LEFT KNEE ARTHROSCOPY WITH PARTIAL MEDIAL MENISCECTOMY AND MEDIAL TIBIAL SUBCHONDROPLASTY;  Surgeon: Mcarthur Rossetti, MD;  Location: [facility];  Service: Orthopedics;  Laterality: Left;  . knee injections Bilateral   . LUMBAR DISC SURGERY  09-12-2000   left  L5 -- S1  . PROSTATE BIOPSY N/A 05/22/2013   Procedure: BIOPSY TRANSRECTAL ULTRASONIC PROSTATE (TUBP);  Surgeon: [name], MD;  Location: [facility];  Service: Urology;  Laterality: N/A;  . ROTATOR CUFF REPAIR Right 2016  .  THROAT SURGERY  04/20/2018  . TRANSRECTAL ULTRASOUND PROSTATE BX  05-23-2005  &  04-19-2001  . TRANSTHORACIC ECHOCARDIOGRAM  09/2017   EF 45-50 % (previously reported as 40 to 45%).  Incoordinate septal motion with mild LVH.  GR 1 DD.  Aortic sclerosis but no stenosis.     Family History  Problem Relation Age of Onset  . Arthritis Mother   . COPD Father   . Cancer Father        lung cancer  . Cancer Sister   . Cardiomyopathy Other        idiopathic. trivial disease 2004 cath, 2010 cardiac CT - no sig. dz, nl LV fxn. cards: Little  . Colon cancer Neg Hx      Social History   Socioeconomic History  . Marital status: Widowed    Spouse name: Not on file  . Number of children: 2  . Years of education: 45  . Highest education level: High school graduate  Occupational History  . 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  Tobacco Use  .  Smoking status: Former Smoker    Packs/day: 2.00    Years: 20.00    Pack years: 40.00    Types: Cigarettes    Quit date: 03/28/1980    Years since quitting: 39.3  . Smokeless tobacco: Never Used  Substance and Sexual Activity  . Alcohol use: No    Alcohol/week: 0.0 standard drinks  . Drug use: No  . Sexual activity: Not on file  Other Topics Concern  . Not on file  Social History Narrative   He walks on a relatively regular basis about 15 minutes at a time mostly to the discomfort. He previously had walked up a 30-40 minutes a time without problems. Education: [employer].   Lives alone.   Right-handed.   One cup coffee daily.   Social Determinants of Health   Financial Resource Strain:   . Difficulty of Paying Living Expenses:   Food Insecurity:   . Worried About Charity fundraiser in the Last Year:   . Arboriculturist in the Last Year:   Transportation Needs:   . Film/video editor (Medical):   Marland Kitchen Lack of Transportation (Non-Medical):   Physical Activity:   . Days of Exercise per Week:     . Minutes of Exercise per Session:   Stress:   . Feeling of Stress :   Social Connections:   . Frequency of Communication with Friends and Family:   . Frequency of Social Gatherings with Friends and Family:   . Attends Religious Services:   . Active Member of Clubs or Organizations:   . Attends Archivist Meetings:   Marland Kitchen Marital Status:   Intimate Partner Violence:   . Fear of Current or Ex-Partner:   . Emotionally Abused:   Marland Kitchen Physically Abused:   . Sexually Abused:      Allergies  Allergen Reactions  . Nitroglycerin Anaphylaxis    "Cardiologist suggested he shouldn't take this med because it could kill him with his heart condition. Lowers BP"  . Ace Inhibitors Swelling  . Lovastatin Nausea Only  . Solarcaine [Benzocaine] Dermatitis     Prior to Admission medications   Medication Sig Start Date End Date Taking? Authorizing Provider  aspirin EC 81 MG tablet Take 1 tablet (81 mg total) by mouth daily. 04/26/18  Yes Costella, Vista Mink, PA-C  gabapentin (NEURONTIN) 300 MG capsule TAKE 1 TO 2 CAPSULES BY  MOUTH 3 TIMES DAILY AS  NEEDED FOR PAIN. 12/01/18  Yes [name], MD  Garlic [id number] MG CAPS Take 1,000 mg by mouth daily.   Yes [provider]  Ginger, Zingiber officinalis, (GINGER ROOT) 550 MG CAPS Take 550 mg by mouth daily.   Yes [provider]  magnesium oxide (MAG-OX) 400 MG tablet Take 400 mg by mouth daily.   Yes [provider]  Multiple Vitamin (MULTIVITAMIN WITH MINERALS) TABS tablet Take 1 tablet by mouth daily. One-A-Day Multivitamin   Yes [provider]  Omega-3 Fatty Acids (FISH OIL) 500 MG CAPS Take 500 mg by mouth daily.   Yes [provider]  rosuvastatin (CRESTOR) 10 MG tablet Take 1 tablet (10 mg total) by mouth every evening. 02/14/19  Yes [name], MD  sildenafil (VIAGRA) 100 MG tablet 1/2 to 1 tablet up to each day as needed. Patient taking differently: Take 50-100 mg by mouth daily as  needed for erectile dysfunction.  07/08/11  Yes [name], MD  tamsulosin (FLOMAX) 0.4 MG CAPS capsule Take  0.4 mg by mouth.   Yes [provider]  Turmeric 500 MG TABS Take 500 mg by mouth daily.   Yes [provider]  vitamin B-12 (CYANOCOBALAMIN) 500 MCG tablet Take 500 mcg by mouth daily.   Yes [provider]  Zinc 50 MG TABS Take 50 mg by mouth daily.   Yes [provider]  polyvinyl alcohol (LIQUIFILM TEARS) 1.4 % ophthalmic solution Place 1 drop into both eyes 3 (three) times daily as needed for dry eyes.     [provider]  Pseudoephedrine-APAP-DM (DAYQUIL MULTI-SYMPTOM PO) Take 2 capsules by mouth as needed (cold symptoms).    [provider]     Depression screen [facility] 2/9 08/08/2019 02/14/2019 08/13/2018 07/24/2018 05/15/2018  Decreased Interest 0 0 0 0 0  Down, Depressed, Hopeless 0 0 0 0 0  PHQ - 2 Score 0 0 0 0 0     Fall Risk  08/08/2019 02/14/2019 08/13/2018 07/24/2018 05/15/2018  Falls in the past year? 0 0 0 0 0  Number falls in past yr: 0 0 0 0 0  Injury with Fall? 0 0 0 0 0  Comment - - - - -  Risk for fall due to : - - - - -  Follow up Falls evaluation completed;Education provided Falls evaluation completed Falls evaluation completed - Falls evaluation completed      PHYSICAL EXAM: BP 126/67 Comment: taken from previous  Ht 5\' 9"  (1.753 m)   Wt 198 lb (89.8 kg)   BMI 29.24 kg/m    Wt Readings from Last 3 Encounters:  08/08/19 198 lb (89.8 kg)  05/09/19 198 lb 8 oz (90 kg)  02/14/19 192 lb 9.6 oz (87.4 kg)       Education/Counseling provided regarding diet and exercise, prevention of chronic diseases, smoking/tobacco cessation, if applicable, and reviewed "Covered Medicare Preventive Services."

## 2019-08-08 NOTE — Patient Instructions (Addendum)
Thank you for taking time to come for your Medicare Wellness Visit. I appreciate your ongoing commitment to your health goals. Please review the following plan we discussed and let me know if I can assist you in the future.  Leroy Kennedy LPN  Preventive Care 75 Years and Older, Male Preventive care refers to lifestyle choices and visits with your health care provider that can promote health and wellness. This includes:  A yearly physical exam. This is also called an annual well check.  Regular dental and eye exams.  Immunizations.  Screening for certain conditions.  Healthy lifestyle choices, such as diet and exercise. What can I expect for my preventive care visit? Physical exam Your health care provider will check:  Height and weight. These may be used to calculate body mass index (BMI), which is a measurement that tells if you are at a healthy weight.  Heart rate and blood pressure.  Your skin for abnormal spots. Counseling Your health care provider may ask you questions about:  Alcohol, tobacco, and drug use.  Emotional well-being.  Home and relationship well-being.  Sexual activity.  Eating habits.  History of falls.  Memory and ability to understand (cognition).  Work and work Statistician. What immunizations do I need?  Influenza (flu) vaccine  This is recommended every year. Tetanus, diphtheria, and pertussis (Tdap) vaccine  You may need a Td booster every 10 years. Varicella (chickenpox) vaccine  You may need this vaccine if you have not already been vaccinated. Zoster (shingles) vaccine  You may need this after age 53. Pneumococcal conjugate (PCV13) vaccine  One dose is recommended after age 53. Pneumococcal polysaccharide (PPSV23) vaccine  One dose is recommended after age 65. Measles, mumps, and rubella (MMR) vaccine  You may need at least one dose of MMR if you were born in 1957 or later. You may also need a second dose. Meningococcal  conjugate (MenACWY) vaccine  You may need this if you have certain conditions. Hepatitis A vaccine  You may need this if you have certain conditions or if you travel or work in places where you may be exposed to hepatitis A. Hepatitis B vaccine  You may need this if you have certain conditions or if you travel or work in places where you may be exposed to hepatitis B. Haemophilus influenzae type b (Hib) vaccine  You may need this if you have certain conditions. You may receive vaccines as individual doses or as more than one vaccine together in one shot (combination vaccines). Talk with your health care provider about the risks and benefits of combination vaccines. What tests do I need? Blood tests  Lipid and cholesterol levels. These may be checked every 5 years, or more frequently depending on your overall health.  Hepatitis C test.  Hepatitis B test. Screening  Lung cancer screening. You may have this screening every year starting at age 34 if you have a 30-pack-year history of smoking and currently smoke or have quit within the past 15 years.  Colorectal cancer screening. All adults should have this screening starting at age 25 and continuing until age 61. Your health care provider may recommend screening at age 67 if you are at increased risk. You will have tests every 1-10 years, depending on your results and the type of screening test.  Prostate cancer screening. Recommendations will vary depending on your family history and other risks.  Diabetes screening. This is done by checking your blood sugar (glucose) after you have not eaten for  a while (fasting). You may have this done every 1-3 years.  Abdominal aortic aneurysm (AAA) screening. You may need this if you are a current or former smoker.  Sexually transmitted disease (STD) testing. Follow these instructions at home: Eating and drinking  Eat a diet that includes fresh fruits and vegetables, whole grains, lean  protein, and low-fat dairy products. Limit your intake of foods with high amounts of sugar, saturated fats, and salt.  Take vitamin and mineral supplements as recommended by your health care provider.  Do not drink alcohol if your health care provider tells you not to drink.  If you drink alcohol: ? Limit how much you have to 0-2 drinks a day. ? Be aware of how much alcohol is in your drink. In the [place] one drink equals one 12 oz bottle of beer (355 mL), one 5 oz glass of wine (148 mL), or one 1 oz glass of hard liquor (44 mL). Lifestyle  Take daily care of your teeth and gums.  Stay active. Exercise for at least 30 minutes on 5 or more days each week.  Do not use any products that contain nicotine or tobacco, such as cigarettes, e-cigarettes, and chewing tobacco. If you need help quitting, ask your health care provider.  If you are sexually active, practice safe sex. Use a condom or other form of protection to prevent STIs (sexually transmitted infections).  Talk with your health care provider about taking a low-dose aspirin or statin. What's next?  Visit your health care provider once a year for a well check visit.  Ask your health care provider how often you should have your eyes and teeth checked.  Stay up to date on all vaccines. This information is not intended to replace advice given to you by your health care provider. Make sure you discuss any questions you have with your health care provider. Document Revised: 03/08/2018 Document Reviewed: 03/08/2018 Elsevier Patient Education  2020 Elsevier Inc.  

## 2019-08-09 NOTE — Telephone Encounter (Signed)
Called pt to answer the question he has about the shingle vaccine, lm for call bk

## 2019-08-09 NOTE — Telephone Encounter (Signed)
Pt has several questions concerning the shingles shot. Please advise.

## 2019-08-15 ENCOUNTER — Ambulatory Visit (INDEPENDENT_AMBULATORY_CARE_PROVIDER_SITE_OTHER): Payer: Medicare Other | Admitting: Family Medicine

## 2019-08-15 ENCOUNTER — Other Ambulatory Visit: Payer: Self-pay

## 2019-08-15 ENCOUNTER — Encounter: Payer: Self-pay | Admitting: Family Medicine

## 2019-08-15 VITALS — BP 122/65 | HR 74 | Temp 97.4°F | Ht 69.0 in | Wt 193.0 lb

## 2019-08-15 DIAGNOSIS — E785 Hyperlipidemia, unspecified: Secondary | ICD-10-CM

## 2019-08-15 DIAGNOSIS — L609 Nail disorder, unspecified: Secondary | ICD-10-CM | POA: Diagnosis not present

## 2019-08-15 DIAGNOSIS — R7303 Prediabetes: Secondary | ICD-10-CM

## 2019-08-15 DIAGNOSIS — M79642 Pain in left hand: Secondary | ICD-10-CM

## 2019-08-15 DIAGNOSIS — M79641 Pain in right hand: Secondary | ICD-10-CM

## 2019-08-15 MED ORDER — ROSUVASTATIN CALCIUM 10 MG PO TABS: 10.0000 mg | ORAL_TABLET | Freq: Every evening | ORAL | 3 refills | Status: DC

## 2019-08-15 NOTE — Patient Instructions (Addendum)
Cut back on Little Debbie desserts.  Keep up the good work with vegetables and fruit in diet.  Shingles vaccine at your pharmacy.  No change in crestor for now.  I will check some lab work and let you know if there are concerns.  Tylenol as needed for hand pain/arthritis, but if one joint is more painful or difficulty with use I would recommend meeting with your hand specialist to discuss possible injection.  Please call your dermatologist for evaluation of the right third nail darkening as soon as possible.  Let me know if she needs a referral.    If you have lab work done today you will be contacted with your lab results within the next 2 weeks.  If you have not heard from [place] then please contact us. The fastest way to get your results is to register for My Chart.   IF you received an x-ray today, you will receive an invoice from [facility]. Please contact [facility] at [phone] with questions or concerns regarding your invoice.   IF you received labwork today, you will receive an invoice from [facility]. Please contact [facility] at [phone] with questions or concerns regarding your invoice.   Our billing staff will not be able to assist you with questions regarding bills from these companies.  You will be contacted with the lab results as soon as they are available. The fastest way to get your results is to activate your My Chart account. Instructions are located on the last page of this paperwork. If you have not heard from [place] regarding the results in 2 weeks, please contact this office.

## 2019-08-15 NOTE — Progress Notes (Addendum)
Subjective:  Patient ID: [name] [name], male    DOB: [birth year]  Age: 74 y.o. MRN: [id number]  CC:  Chief Complaint  Patient presents with  . Hyperlipidemia    follow up   . Arthritis    hands  . Prediabetes    HPI Ewin Rehberg presents for   Bilateral hand pain: History of cervical spine disease status post cervical fusion, has been treated with gabapentin.  Discussed pain in both hands for many years as well as both hand numbness at his February 14, 2019 visit. History of carpal tunnel release right-sided carpal tunnel syndrome.  Hand specialist Dr. Caralyn Guile with [facility].  Date of surgery January 20. Surgery helped some with R. Plans on left carpal tunnel surgery next year in January.  Numbness is better, some pain remains in hand with arthritis. Pain in both hands. No treatments. Only rare tylenol. Still active and not limiting activities.  Thumb and some other hand joint pains.   R third nail lesion: Dark area in nail. Though was bruise, but there 3-6 months. Has dermatologist - Lennie Odor. Recent visit but nail was not noted.  Hyperlipidemia: Crestor 10 mg daily, did run out 2 weeks prior to testing in November. History of nonischemic cardiomyopathy, left bundle branch block followed by cardiology, Dr. Ellyn Hack. Now on crestor daily.  No new side effects.  Lab Results  Component Value Date   CHOL 210 (H) 02/14/2019   HDL 39 (L) 02/14/2019   LDLCALC 138 (H) 02/14/2019   TRIG 182 (H) 02/14/2019   CHOLHDL 5.4 (H) 02/14/2019   Lab Results  Component Value Date   ALT 15 02/14/2019   AST 16 02/14/2019   ALKPHOS 73 02/14/2019   BILITOT 0.4 02/14/2019   Prediabetes: Walking for exercise, chores around the home. Fishing.  Cut back on sodas, no sweet tea. No alcohol.  Fast food - once per week or two.  Eating fruit/vegatables in place of some sweets. Still eating little debbie pastry 1-2 per day.   Wt Readings from Last 3 Encounters:  08/15/19 193 lb  (87.5 kg)  08/08/19 198 lb (89.8 kg)  05/09/19 198 lb 8 oz (90 kg)    Lab Results  Component Value Date   HGBA1C 6.1 (H) 02/14/2019   HGBA1C 6.1 (H) 08/15/2018   HGBA1C 6.2 (H) 03/26/2018   Lab Results  Component Value Date   LDLCALC 138 (H) 02/14/2019   CREATININE 1.01 02/14/2019      History Patient Active Problem List   Diagnosis Date Noted  . S/P cervical spinal fusion 04/20/2018  . Paresthesia 01/10/2018  . Neck pain 01/10/2018  . LBBB (left bundle branch block) 09/07/2017  . Other meniscus derangements, posterior horn of medial meniscus, left knee, insufficency fracture medial tibial plateau 04/07/2017  . Closed nondisplaced fracture of lateral condyle of left tibia with routine healing 04/07/2017  . Status post arthroscopy of left knee 04/07/2017  . Insomnia 09/14/2015  . Hyperlipidemia with target LDL less than 100 09/11/2014  . Rotator cuff tendinitis 04/28/2014  . Obesity (BMI 30-39.9) 12/29/2013  . 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  . Exertional shortness of breath 06/26/2012  . Polyp of colon, adenomatous 04/27/2012  . Elevated PSA 10/20/2011  . Hypertriglyceridemia 07/08/2011  . Nonischemic cardiomyopathy ([place]) 09/04/2009   Past Medical History:  Diagnosis Date  . Arthritis   . BPH (benign prostatic hypertrophy)   . Coronary atherosclerosis of native coronary vessel    CATH 2004--  MINIMAL NONOBSTRUCTIVE LAD/  NORMAL EF/    CARDIAC CT  03/2008  NO SIGNIFICANT DISEASE  AND  nlef  . Elevated PSA   . Eye problem 03/06/2016   [name] retinal detachment left eye, no sx done  . Headache   . History of gout    pt 05-20-2013 states stable   . History of melanoma excision    2011-- LEFT UPPER BACK  . Hyperlipemia   . Hypertriglyceridemia   . Left bundle branch block    08/2017  . Melanoma ([place]) 2011   back  . Nonischemic cardiomyopathy ([place])    09-04-2009  --  2D echo-EF 40-45%, Global Hypokinesis - 09/2017 -> EF 45-50%   . Numbness     hands  . Personal history of arthritis   . Personal history of other diseases of circulatory system    Past Surgical History:  Procedure Laterality Date  . ANTERIOR CERVICAL DECOMP/DISCECTOMY FUSION N/A 04/20/2018   Procedure: Anterior Cervical Decompression Fusion - Cervical Three-Cervical Four - Cervical Four-Cervical Five - Cervical Five-Cervical Six;  Surgeon: Eustace Moore, MD;  Location: [facility];  Service: Neurosurgery;  Laterality: N/A;  Anterior Cervical Decompression Fusion - Cervical Three-Cervical Four - Cervical Four-Cervical Five - Cervical Five-Cervical Six  . APPENDECTOMY  AS CHILD  . BACK SURGERY    . CARDIAC CATHETERIZATION  05/23/2002  &  05-23-1998   DR AL LITTLE   minimal irregularities in the LAD/  EF 50%  . CARDIOVASCULAR STRESS TEST  10-05-1998   MILD GLOBAL HYPOKINESIS AND ISCHEMIAIN ANTEROSEPTAL  AT [facility]/ EF 42%  . CARPAL TUNNEL RELEASE Right   . EXCISION MELANOMA LEFT UPPER BACK  10-14-2009  . eye lid surgery Bilateral 2018  . KNEE ARTHROSCOPY WITH SUBCHONDROPLASTY Left 04/07/2017   Procedure: LEFT KNEE ARTHROSCOPY WITH PARTIAL MEDIAL MENISCECTOMY AND MEDIAL TIBIAL SUBCHONDROPLASTY;  Surgeon: Mcarthur Rossetti, MD;  Location: [facility];  Service: Orthopedics;  Laterality: Left;  . knee injections Bilateral   . LUMBAR DISC SURGERY  09-12-2000   left  L5 -- S1  . PROSTATE BIOPSY N/A 05/22/2013   Procedure: BIOPSY TRANSRECTAL ULTRASONIC PROSTATE (TUBP);  Surgeon: [name], MD;  Location: [facility];  Service: Urology;  Laterality: N/A;  . ROTATOR CUFF REPAIR Right 2016  . THROAT SURGERY  04/20/2018  . TRANSRECTAL ULTRASOUND PROSTATE BX  05-23-2005  &  04-19-2001  . TRANSTHORACIC ECHOCARDIOGRAM  09/2017   EF 45-50 % (previously reported as 40 to 45%).  Incoordinate septal motion with mild LVH.  GR 1 DD.  Aortic sclerosis but no stenosis.   Allergies  Allergen Reactions  . Nitroglycerin Anaphylaxis    "Cardiologist suggested he shouldn't take  this med because it could kill him with his heart condition. Lowers BP"  . Ace Inhibitors Swelling  . Lovastatin Nausea Only  . Solarcaine [Benzocaine] Dermatitis   Prior to Admission medications   Medication Sig Start Date End Date Taking? Authorizing Provider  aspirin EC 81 MG tablet Take 1 tablet (81 mg total) by mouth daily. 04/26/18  Yes Costella, Vista Mink, PA-C  gabapentin (NEURONTIN) 300 MG capsule TAKE 1 TO 2 CAPSULES BY  MOUTH 3 TIMES DAILY AS  NEEDED FOR PAIN. 12/01/18  Yes [name], MD  Garlic 8841 MG CAPS Take 1,000 mg by mouth daily.   Yes [provider]  Ginger, Zingiber officinalis, (GINGER ROOT) 550 MG CAPS Take 550 mg by mouth daily.   Yes [provider]  magnesium oxide (MAG-OX) 400 MG  tablet Take 400 mg by mouth daily.   Yes [provider]  Multiple Vitamin (MULTIVITAMIN WITH MINERALS) TABS tablet Take 1 tablet by mouth daily. One-A-Day Multivitamin   Yes [provider]  Omega-3 Fatty Acids (FISH OIL) 500 MG CAPS Take 500 mg by mouth daily.   Yes [provider]  rosuvastatin (CRESTOR) 10 MG tablet Take 1 tablet (10 mg total) by mouth every evening. 02/14/19  Yes [name], MD  sildenafil (VIAGRA) 100 MG tablet 1/2 to 1 tablet up to each day as needed. Patient taking differently: Take 50-100 mg by mouth daily as needed for erectile dysfunction.  07/08/11  Yes [name], MD  tamsulosin (FLOMAX) 0.4 MG CAPS capsule Take 0.4 mg by mouth.   Yes [provider]  Turmeric 500 MG TABS Take 500 mg by mouth daily.   Yes [provider]  vitamin B-12 (CYANOCOBALAMIN) 500 MCG tablet Take 500 mcg by mouth daily.   Yes [provider]  Zinc 50 MG TABS Take 50 mg by mouth daily.   Yes [provider]   Social History   Socioeconomic History  . Marital status: Widowed    Spouse name: Not on file  . Number of children: 2  . Years of education: 81  . Highest education level: High  school graduate  Occupational History  . 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  Tobacco Use  . Smoking status: Former Smoker    Packs/day: 2.00    Years: 20.00    Pack years: 40.00    Types: Cigarettes    Quit date: 03/28/1980    Years since quitting: 39.4  . Smokeless tobacco: Never Used  Substance and Sexual Activity  . Alcohol use: No    Alcohol/week: 0.0 standard drinks  . Drug use: No  . Sexual activity: Not on file  Other Topics Concern  . Not on file  Social History Narrative   He walks on a relatively regular basis about 15 minutes at a time mostly to the discomfort. He previously had walked up a 30-40 minutes a time without problems. Education: [employer].   Lives alone.   Right-handed.   One cup coffee daily.   Social Determinants of Health   Financial Resource Strain:   . Difficulty of Paying Living Expenses:   Food Insecurity:   . Worried About Charity fundraiser in the Last Year:   . Arboriculturist in the Last Year:   Transportation Needs:   . Film/video editor (Medical):   Marland Kitchen Lack of Transportation (Non-Medical):   Physical Activity:   . Days of Exercise per Week:   . Minutes of Exercise per Session:   Stress:   . Feeling of Stress :   Social Connections:   . Frequency of Communication with Friends and Family:   . Frequency of Social Gatherings with Friends and Family:   . Attends Religious Services:   . Active Member of Clubs or Organizations:   . Attends Archivist Meetings:   Marland Kitchen Marital Status:   Intimate Partner Violence:   . Fear of Current or Ex-Partner:   . Emotionally Abused:   Marland Kitchen Physically Abused:   . Sexually Abused:     Review of Systems   Objective:   Vitals:   08/15/19 0928  BP: 122/65  Pulse: 74  Temp: (!) 97.4 F (36.3 C)  SpO2: 97%  Weight: 193 lb (87.5 kg)  Height: 5' 9"  (1.753 m)     Physical Exam Vitals reviewed.  Constitutional:      Appearance: He is  well-developed.  HENT:     Head: Normocephalic and atraumatic.  Eyes:     Pupils: Pupils are equal, round, and reactive to light.  Neck:     Vascular: No carotid bruit or JVD.  Cardiovascular:     Rate and Rhythm: Normal rate and regular rhythm.     Heart sounds: Normal heart sounds. No murmur.  Pulmonary:     Effort: Pulmonary effort is normal.     Breath sounds: Normal breath sounds. No rales.  Musculoskeletal:     Comments: Prominent DIPs, PIPs of some joints of both hands.  Primary areas of pain over the [facility] joint at the right greater than left hand, slight pain at the fifth DIP of the right hand.  Full range of motion, full strength.  Skin intact.    Skin:    General: Skin is warm and dry.     Comments: Darkened area of mid aspect right third nail.  See photo  Neurological:     Mental Status: He is alert and oriented to person, place, and time.        Assessment & Plan:  Zanden Colver is a 74 y.o. male . Hyperlipidemia, unspecified hyperlipidemia type - Plan: Lipid panel, rosuvastatin (CRESTOR) 10 MG tablet  -Repeat testing, previously off medicines temporarily.  Continue same dose Crestor for now.  Prediabetes - Plan: CMP14+EGFR, Hemoglobin A1c  -Dietary guidance given with avoidance of sweets, portion control.  Commended on decreasing sodas.  Check A1c  Bilateral hand pain  -Numbness is improved with carpal tunnel release on right, still some symptoms on left, is to defer surgery until January next year.  Suspect component of arthritis as well.  Recommend initial treatment with Tylenol over-the-counter, follow-up with hand specialist if 1 joint more painful or limiting activity.  Currently able to use hands normally.  Nail abnormality  -Initially found to have subungual hematoma, but darkening is present for 3 to 6 months.  Recommended evaluation with dermatology to evaluate for possible biopsy, differential includes Acral melanoma.    10:31 AM After visit  -patient asked about some clicking or popping in his jaw at times over the past few weeks.  No pain, no difficulty with jaw use, able to eat and drink normally.  Has not seen dentist in the past few years.  Recommended Tylenol as above, then follow-up in person in the next week if not improving.  Also due for dental follow-up and recommended.  Meds ordered this encounter  Medications  . rosuvastatin (CRESTOR) 10 MG tablet    Sig: Take 1 tablet (10 mg total) by mouth every evening.    Dispense:  90 tablet    Refill:  3    Requesting 1 year supply   Patient Instructions   Cut back on Little Debbie desserts.  Keep up the good work with vegetables and fruit in diet.  Shingles vaccine at your pharmacy.  No change in crestor for now.  I will check some lab work and let you know if there are concerns.  Tylenol as needed for hand pain/arthritis, but if one joint is more painful or difficulty with use I would recommend meeting with your hand specialist to discuss possible injection.  Please call your dermatologist for evaluation of the right third nail darkening as soon as possible.  Let me know if she needs  a referral.    If you have lab work done today you will be contacted with your lab results within the next 2 weeks.  If you have not heard from [place] then please contact us. The fastest way to get your results is to register for My Chart.   IF you received an x-ray today, you will receive an invoice from [facility]. Please contact [facility] at [phone] with questions or concerns regarding your invoice.   IF you received labwork today, you will receive an invoice from [facility]. Please contact [facility] at [phone] with questions or concerns regarding your invoice.   Our billing staff will not be able to assist you with questions regarding bills from these companies.  You will be contacted with the lab results as soon as they are available. The fastest way to get  your results is to activate your My Chart account. Instructions are located on the last page of this paperwork. If you have not heard from [place] regarding the results in 2 weeks, please contact this office.          Signed, [name], MD [facility]

## 2019-08-16 LAB — CMP14+EGFR
ALT: 22 IU/L (ref 0–44)
AST: 18 IU/L (ref 0–40)
Albumin/Globulin Ratio: 1.6 (ref 1.2–2.2)
Albumin: 4.6 g/dL (ref 3.7–4.7)
Alkaline Phosphatase: 76 IU/L (ref 48–121)
BUN/Creatinine Ratio: 15 (ref 10–24)
BUN: 18 mg/dL (ref 8–27)
Bilirubin Total: 0.3 mg/dL (ref 0.0–1.2)
CO2: 22 mmol/L (ref 20–29)
Calcium: 9.5 mg/dL (ref 8.6–10.2)
Chloride: 104 mmol/L (ref 96–106)
Creatinine, Ser: 1.18 mg/dL (ref 0.76–1.27)
GFR calc Af Amer: 69 mL/min/{1.73_m2} (ref >59)
GFR calc non Af Amer: 60 mL/min/{1.73_m2} (ref >59)
Globulin, Total: 2.8 g/dL (ref 1.5–4.5)
Glucose: 120 mg/dL — ABNORMAL HIGH (ref 65–99)
Potassium: 4.8 mmol/L (ref 3.5–5.2)
Sodium: 140 mmol/L (ref 134–144)
Total Protein: 7.4 g/dL (ref 6.0–8.5)

## 2019-08-16 LAB — HEMOGLOBIN A1C
Est. average glucose Bld gHb Est-mCnc: 134 mg/dL
Hgb A1c MFr Bld: 6.3 % — ABNORMAL HIGH (ref 4.8–5.6)

## 2019-08-16 LAB — LIPID PANEL
Chol/HDL Ratio: 3 ratio (ref 0.0–5.0)
Cholesterol, Total: 125 mg/dL (ref 100–199)
HDL: 41 mg/dL (ref >39)
LDL Chol Calc (NIH): 65 mg/dL (ref 0–99)
Triglycerides: 103 mg/dL (ref 0–149)
VLDL Cholesterol Cal: 19 mg/dL (ref 5–40)

## 2019-08-29 ENCOUNTER — Other Ambulatory Visit: Payer: Self-pay

## 2019-08-29 ENCOUNTER — Ambulatory Visit (INDEPENDENT_AMBULATORY_CARE_PROVIDER_SITE_OTHER): Payer: Medicare Other | Admitting: Cardiology

## 2019-08-29 ENCOUNTER — Encounter: Payer: Self-pay | Admitting: Cardiology

## 2019-08-29 VITALS — BP 104/60 | HR 73 | Ht 70.0 in | Wt 199.2 lb

## 2019-08-29 DIAGNOSIS — E785 Hyperlipidemia, unspecified: Secondary | ICD-10-CM | POA: Diagnosis not present

## 2019-08-29 DIAGNOSIS — I447 Left bundle-branch block, unspecified: Secondary | ICD-10-CM | POA: Diagnosis not present

## 2019-08-29 DIAGNOSIS — I739 Peripheral vascular disease, unspecified: Secondary | ICD-10-CM | POA: Diagnosis not present

## 2019-08-29 DIAGNOSIS — I428 Other cardiomyopathies: Secondary | ICD-10-CM | POA: Diagnosis not present

## 2019-08-29 NOTE — Patient Instructions (Addendum)
Medication Instructions:   STAT[facility] HOLIDAY-  STOP TAKING CHOLESTEROL MEDICATION ( ROSUVASTATIN)  2- 4 WEEKS - IF LEG DISCOMFORT DOES NT GET BETTER IT IS NOT YOUR MEDICATION , IF THE LEG DISCOMFORT  GETS BETTER - CONTACT   OFFICE WE WILL HAVE YOU DECREASE ROSUVASTATIN    *If you need a refill on your cardiac medications before your next appointment, please call your pharmacy*   Lab Work: NOT NEEDED    Testing/Procedures: WILL BE SCHEDULE AT [facility] has requested that you have an ankle brachial index (ABI). During this test an ultrasound and blood pressure cuff are used to evaluate the arteries that supply the arms and legs with blood. Allow thirty minutes for this exam. There are no restrictions or special instructions. AND Your physician has requested that you have a lower extremity arterial duplex. This test is an ultrasound of the arteries in the legs. It looks at arterial blood flow in the legs. Allow one hour for Lower Arterial scans. There are no restrictions or special instructions   Follow-Up: At [facility], you and your health needs are our priority.  As part of our continuing mission to provide you with exceptional heart care, we have created designated Provider Care Teams.  These Care Teams include your primary Cardiologist (physician) and Advanced Practice Providers (APPs -  Physician Assistants and Nurse Practitioners) who all work together to provide you with the care you need, when you need it.    Your next appointment:   12 month(s)  The format for your next appointment:   In Person  Provider:   Glenetta Hew, MD

## 2019-08-29 NOTE — Progress Notes (Signed)
Primary Care Provider: [name], MD Cardiologist: Glenetta Hew, MD Electrophysiologist: None  Clinic Note: Chief Complaint  Patient presents with  . Follow-up    Annual; No major symptoms.    HPI:    [name] [name] is a 74 y.o. male with a PMH notable for nonischemic cardiomyopathy (EF ~45%), LBBB and mild aortic stenosis who presents today for annual follow-up  [name] [name] was last seen on 06/19/2018 via telemedicine.  He was doing quite well.  Staying active with yard work and housework as well as fishing and hunting.-Just not as much energy as he used to have. -->  Euvolemic.  Not on diuretic.  Also not on beta-blocker or ARB/ACE inhibitor because of well-controlled blood pressures.  Recent Hospitalizations: None  Reviewed  CV studies:    The following studies were reviewed today: (if available, images/films reviewed: From Epic Chart or Care Everywhere) . None:   Interval History:   [name] [name] is here today overall again still doing relatively well, he notes now is that his legs thighs and calves hurt if he walks more than usual.  A lot of this he thought was related to his peripheral neuropathy because his constant aching and discomfort in the legs also the neck area.  He is otherwise doing very well.  He still says he does not have as much energy as he used to have, but this is a normal thing that he says.  He denies any heart failure symptoms of PND, orthopnea or edema and no chest pain/pressure with rest or exertion.  CV Review of Symptoms (Summary) Cardiovascular ROS: no chest pain or dyspnea on exertion positive for - Mild pedal edema and leg discomfort with walking. negative for - irregular heartbeat, orthopnea, palpitations, paroxysmal nocturnal dyspnea, rapid heart rate, shortness of breath or Syncope/near syncope, TIA/amaurosis fugax.  The patient does not have symptoms concerning for COVID-19 infection (fever, chills, cough, or new  shortness of breath).  The patient is practicing social distancing & Masking.    REVIEWED OF SYSTEMS   Review of Systems  Constitutional: Negative for malaise/fatigue (Not as much energy as he used to have) and weight loss.  HENT: Negative for congestion and nosebleeds.   Respiratory: Negative for shortness of breath.        Baseline DOE  Cardiovascular: Positive for claudication (Legs ache, sometimes walking).  Gastrointestinal: Negative for blood in stool and melena.  Genitourinary: Negative for hematuria.  Musculoskeletal: Positive for joint pain (Knees) and myalgias (Calves and ache.). Negative for falls.  Neurological: Positive for tingling (Feet and legs). Negative for focal weakness and weakness.  Psychiatric/Behavioral: Negative for memory loss. The patient is not nervous/anxious and does not have insomnia.    I have reviewed and (if needed) personally updated the patient's problem list, medications, allergies, past medical and surgical history, social and family history.   PAST MEDICAL HISTORY   Past Medical History:  Diagnosis Date  . Arthritis   . BPH (benign prostatic hypertrophy)   . Coronary atherosclerosis of native coronary vessel    CATH 2004--  MINIMAL NONOBSTRUCTIVE LAD/  NORMAL EF/    CARDIAC CT  03/2008  NO SIGNIFICANT DISEASE  AND  nlef  . Elevated PSA   . Eye problem 03/06/2016   [name] retinal detachment left eye, no sx done  . Headache   . History of gout    pt 05-20-2013 states stable   . History of melanoma excision    2011--  LEFT UPPER BACK  . Hyperlipemia   . Hypertriglyceridemia   . Left bundle branch block    08/2017  . Melanoma ([place]) 2011   back  . Nonischemic cardiomyopathy ([place])    09-04-2009  --  2D echo-EF 40-45%, Global Hypokinesis - 09/2017 -> EF 45-50%   . Numbness    hands  . Personal history of arthritis   . Personal history of other diseases of circulatory system     PAST SURGICAL HISTORY   Past Surgical History:    Procedure Laterality Date  . ANTERIOR CERVICAL DECOMP/DISCECTOMY FUSION N/A 04/20/2018   Procedure: Anterior Cervical Decompression Fusion - Cervical Three-Cervical Four - Cervical Four-Cervical Five - Cervical Five-Cervical Six;  Surgeon: Eustace Moore, MD;  Location: [facility];  Service: Neurosurgery;  Laterality: N/A;  Anterior Cervical Decompression Fusion - Cervical Three-Cervical Four - Cervical Four-Cervical Five - Cervical Five-Cervical Six  . APPENDECTOMY  AS CHILD  . BACK SURGERY    . CARDIAC CATHETERIZATION  05/23/2002  &  05-23-1998   DR AL LITTLE   minimal irregularities in the LAD/  EF 50%  . CARDIOVASCULAR STRESS TEST  10-05-1998   MILD GLOBAL HYPOKINESIS AND ISCHEMIAIN ANTEROSEPTAL  AT [facility]/ EF 42%  . CARPAL TUNNEL RELEASE Right   . EXCISION MELANOMA LEFT UPPER BACK  10-14-2009  . eye lid surgery Bilateral 2018  . KNEE ARTHROSCOPY WITH SUBCHONDROPLASTY Left 04/07/2017   Procedure: LEFT KNEE ARTHROSCOPY WITH PARTIAL MEDIAL MENISCECTOMY AND MEDIAL TIBIAL SUBCHONDROPLASTY;  Surgeon: Mcarthur Rossetti, MD;  Location: [facility];  Service: Orthopedics;  Laterality: Left;  . knee injections Bilateral   . LUMBAR DISC SURGERY  09-12-2000   left  L5 -- S1  . PROSTATE BIOPSY N/A 05/22/2013   Procedure: BIOPSY TRANSRECTAL ULTRASONIC PROSTATE (TUBP);  Surgeon: [name], MD;  Location: [facility];  Service: Urology;  Laterality: N/A;  . ROTATOR CUFF REPAIR Right 2016  . THROAT SURGERY  04/20/2018  . TRANSRECTAL ULTRASOUND PROSTATE BX  05-23-2005  &  04-19-2001  . TRANSTHORACIC ECHOCARDIOGRAM  09/2017   EF 45-50 % (previously reported as 40 to 45%).  Incoordinate septal motion with mild LVH.  GR 1 DD.  Aortic sclerosis but no stenosis.    MEDICATIONS/ALLERGIES   Current Meds  Medication Sig  . aspirin EC 81 MG tablet Take 1 tablet (81 mg total) by mouth daily.  Marland Kitchen gabapentin (NEURONTIN) 300 MG capsule TAKE 1 TO 2 CAPSULES BY  MOUTH 3 TIMES DAILY AS  NEEDED FOR  PAIN.  Marland Kitchen Garlic 123XX123 MG CAPS Take 1,000 mg by mouth daily.  . Ginger, Zingiber officinalis, (GINGER ROOT) 550 MG CAPS Take 550 mg by mouth daily.  . magnesium oxide (MAG-OX) 400 MG tablet Take 400 mg by mouth daily.  . Multiple Vitamin (MULTIVITAMIN WITH MINERALS) TABS tablet Take 1 tablet by mouth daily. One-A-Day Multivitamin  . Omega-3 Fatty Acids (FISH OIL) 500 MG CAPS Take 500 mg by mouth daily.  . rosuvastatin (CRESTOR) 10 MG tablet Take 1 tablet (10 mg total) by mouth every evening.  . sildenafil (VIAGRA) 100 MG tablet 1/2 to 1 tablet up to each day as needed. (Patient taking differently: Take 50-100 mg by mouth daily as needed for erectile dysfunction. )  . tamsulosin (FLOMAX) 0.4 MG CAPS capsule Take 0.4 mg by mouth.  . Turmeric 500 MG TABS Take 500 mg by mouth daily.  . vitamin B-12 (CYANOCOBALAMIN) 500 MCG tablet Take 500 mcg by mouth daily.  Marland Kitchen Zinc  50 MG TABS Take 50 mg by mouth daily.    Allergies  Allergen Reactions  . Nitroglycerin Anaphylaxis    "Cardiologist suggested he shouldn't take this med because it could kill him with his heart condition. Lowers BP"  . Ace Inhibitors Swelling  . Lovastatin Nausea Only  . Solarcaine [Benzocaine] Dermatitis    SOCIAL HISTORY/FAMILY HISTORY   Reviewed in Epic:  Pertinent findings: He has not had current vaccine yet.  OBJCTIVE -PE, EKG, labs   Wt Readings from Last 3 Encounters:  08/29/19 199 lb 3.2 oz (90.4 kg)  08/15/19 193 lb (87.5 kg)  08/08/19 198 lb (89.8 kg)    Physical Exam: BP 104/60   Pulse 73   Ht 5\' 10"  (1.778 m)   Wt 199 lb 3.2 oz (90.4 kg)   BMI 28.58 kg/m  Physical Exam  Constitutional: He is oriented to person, place, and time. He appears well-developed and well-nourished. No distress.  Relatively healthy-appearing.  Well-groomed  HENT:  Head: Normocephalic and atraumatic.  Neck: No hepatojugular reflux and no JVD present. Carotid bruit is not present.  Cardiovascular: Normal rate and regular  rhythm.  Occasional extrasystoles are present. PMI is not displaced. Exam reveals distant heart sounds (Normal S1 with split S2) and decreased pulses (Palpable but weak pedal pulses). Exam reveals no gallop and no friction rub.  No murmur heard. Pulmonary/Chest: Effort normal and breath sounds normal. No respiratory distress. He has no wheezes. He has no rales.  Abdominal: Soft. Bowel sounds are normal. He exhibits no distension. There is no abdominal tenderness. There is no rebound.  Musculoskeletal:        General: No edema. Normal range of motion.     Cervical back: Normal range of motion and neck supple.  Neurological: He is alert and oriented to person, place, and time.  Psychiatric: He has a normal mood and affect. His behavior is normal. Judgment and thought content normal.     Adult ECG Report  Rate: 73 ;  Rhythm: normal sinus rhythm and Mild LVH.  Septal MI age undetermined; IVCD  Narrative Interpretation: stable EKG.  Recent Labs:    Lab Results  Component Value Date   CHOL 125 08/15/2019   HDL 41 08/15/2019   LDLCALC 65 08/15/2019   TRIG 103 08/15/2019   CHOLHDL 3.0 08/15/2019   Lab Results  Component Value Date   CREATININE 1.18 08/15/2019   BUN 18 08/15/2019   NA 140 08/15/2019   K 4.8 08/15/2019   CL 104 08/15/2019   CO2 22 08/15/2019   Lab Results  Component Value Date   TSH 1.280 08/08/2017    ASSESSMENT/PLAN    Problem List Items Addressed This Visit    Nonischemic cardiomyopathy ([place]) (Chronic)    Borderline EF by echo.  No heart failure symptoms of PND, orthopnea or edema.  Euvolemic on exam.  Blood pressure continues to be too low to tolerate beta-blocker or ACE inhibitor/ARB.Marland Kitchen  For now, with no active symptoms, would not treat any further than we are treating now.      Relevant Orders   EKG 12-Lead (Completed)   Hyperlipidemia with target LDL less than 100 (Chronic)    Recent labs show very well controlled LDL.  I am a little worried with his  legs aching today could be related to statin.  We will try a 2-week statin holiday.  If his symptoms improved, we would probably reduce to 2 days a week statin.  If no symptom change, just  go ventricle dose.      LBBB (left bundle branch block) (Chronic)    Most likely related to the EF.  Interestingly, EKG today shows more of a IVCD than true LBBB.      Relevant Orders   EKG 12-Lead (Completed)   Claudication ([place]) - Primary    Difficult to tell like to stop it is related to true claudication versus possibly peripheral neuropathy.  We will check lower extremity arterial Dopplers with ABIs to exclude PAD.      Relevant Orders   VAS [place] LOWER EXTREMITY ARTERIAL DUPLEX   VAS [place] LE ART SEG MULTI (Segm&LE Reynauds)       COVID-19 Education: The signs and symptoms of COVID-19 were discussed with the patient and how to seek care for testing (follow up with PCP or arrange E-visit).   The importance of social distancing and COVID-19 vaccination was discussed today.  I spent a total of 22 minutes with the patient. >  50% of the time was spent in direct patient consultation.  Additional time spent with chart review  / charting (studies, outside notes, etc): 8 Total Time: 30 min   Current medicines are reviewed at length with the patient today.  (+/- concerns) legs ache   Notice: This dictation was prepared with Dragon dictation along with smaller phrase technology. Any transcriptional errors that result from this process are unintentional and may not be corrected upon review.  Patient Instructions / Medication Changes & Studies & Tests Ordered   Patient Instructions  Medication Instructions:   STATIN HOLIDAY-  STOP TAKING CHOLESTEROL MEDICATION ( ROSUVASTATIN)  2- 4 WEEKS - IF LEG DISCOMFORT DOES NT GET BETTER IT IS NOT YOUR MEDICATION , IF THE LEG DISCOMFORT  GETS BETTER - CONTACT   OFFICE WE WILL HAVE YOU DECREASE ROSUVASTATIN    *If you need a refill on your cardiac medications  before your next appointment, please call your pharmacy*   Lab Work: NOT NEEDED    Testing/Procedures: WILL BE SCHEDULE AT [place] has requested that you have an ankle brachial index (ABI). During this test an ultrasound and blood pressure cuff are used to evaluate the arteries that supply the arms and legs with blood. Allow thirty minutes for this exam. There are no restrictions or special instructions. AND Your physician has requested that you have a lower extremity arterial duplex. This test is an ultrasound of the arteries in the legs. It looks at arterial blood flow in the legs. Allow one hour for Lower Arterial scans. There are no restrictions or special instructions   Follow-Up: At [facility], you and your health needs are our priority.  As part of our continuing mission to provide you with exceptional heart care, we have created designated Provider Care Teams.  These Care Teams include your primary Cardiologist (physician) and Advanced Practice Providers (APPs -  Physician Assistants and Nurse Practitioners) who all work together to provide you with the care you need, when you need it.    Your next appointment:   12 month(s)  The format for your next appointment:   In Person  Provider:   Glenetta Hew, MD    Studies Ordered:   Orders Placed This Encounter  Procedures  . EKG 12-Lead  . VAS [place] LOWER EXTREMITY ARTERIAL DUPLEX  . VAS [place] LE ART SEG MULTI (Segm&LE Reynauds)     Glenetta Hew, M.D., M.S. Interventional Cardiologist   Pager # [phone] Phone # [phone]  [address] [place], [place] [address]   Thank you for choosing Heartcare at [facility]!!

## 2019-08-30 ENCOUNTER — Telehealth: Payer: Self-pay | Admitting: Family Medicine

## 2019-08-30 NOTE — Telephone Encounter (Signed)
Pt called is needing copy of labs results mailed , also would like a call with his results, pt states we called yesterday , no documentation

## 2019-08-30 NOTE — Telephone Encounter (Signed)
I have attempted to call pt back there was no answer so I left a message to call back.   I have also mailed him a letter with his lab results.   Pt is able to look into his mychart for the doctor did send him a message about his labs there.   The message is also stated below.   [name] [name],  Cholesterol levels look better than 6 months ago. Electrolytes looked okay except for elevated blood sugar, still at prediabetes range but slightly higher than 6 months ago. Continue to watch diet, activity, recheck levels in 6 months. Let me know if there are questions.  Dr. Carlota Raspberry

## 2019-09-01 ENCOUNTER — Encounter: Payer: Self-pay | Admitting: Cardiology

## 2019-09-01 DIAGNOSIS — I739 Peripheral vascular disease, unspecified: Secondary | ICD-10-CM | POA: Insufficient documentation

## 2019-09-01 NOTE — Assessment & Plan Note (Signed)
Borderline EF by echo.  No heart failure symptoms of PND, orthopnea or edema.  Euvolemic on exam.  Blood pressure continues to be too low to tolerate beta-blocker or ACE inhibitor/ARB.[name] [name]  For now, with no active symptoms, would not treat any further than we are treating now.

## 2019-09-01 NOTE — Assessment & Plan Note (Signed)
Recent labs show very well controlled LDL.  I am a little worried with his legs aching today could be related to statin.  We will try a 2-week statin holiday.  If his symptoms improved, we would probably reduce to 2 days a week statin.  If no symptom change, just go ventricle dose.

## 2019-09-01 NOTE — Assessment & Plan Note (Signed)
Difficult to tell like to stop it is related to true claudication versus possibly peripheral neuropathy.  We will check lower extremity arterial Dopplers with ABIs to exclude PAD.

## 2019-09-01 NOTE — Assessment & Plan Note (Signed)
Most likely related to the EF.  Interestingly, EKG today shows more of a IVCD than true LBBB.

## 2019-09-02 ENCOUNTER — Other Ambulatory Visit: Payer: Self-pay

## 2019-09-02 ENCOUNTER — Ambulatory Visit (HOSPITAL_COMMUNITY)
Admission: RE | Admit: 2019-09-02 | Discharge: 2019-09-02 | Disposition: A | Discharge status: Home / Self Care | Payer: Medicare Other | Source: Ambulatory Visit | Attending: Cardiovascular Disease | Admitting: Cardiovascular Disease

## 2019-09-02 DIAGNOSIS — I739 Peripheral vascular disease, unspecified: Secondary | ICD-10-CM | POA: Diagnosis not present

## 2019-10-09 ENCOUNTER — Telehealth: Payer: Self-pay | Admitting: Family Medicine

## 2019-10-09 NOTE — Telephone Encounter (Signed)
Attempted to call pt and there was no answer.   Plan: to ask him about medication and how long has it been making him feel pain and where is the pain.

## 2019-10-09 NOTE — Telephone Encounter (Signed)
What is the name of the medication? Pt has been on [name] [name], he states this causes him pain. He would like to try a new cholesterol med.   Have you contacted your pharmacy to request a refill? no  Which pharmacy would you like this sent to? Pharmacy  [facility], [address], [address]  [place], [place], [place] [address]  Phone:  [phone] Fax:  [phone]       Patient notified that their request is being sent to the clinical staff for review and that they should receive a call once it is complete. If they do not receive a call within 72 hours they can check with their pharmacy or our office.

## 2019-10-10 NOTE — Telephone Encounter (Signed)
Spoke with pt and he stated that the Crestor that he was on has making him have body aches and pain all over so his cardio suggested that he stop taking the medication and since then, he feels a lot better. He has not taking the medication in about 3/4 weeks and wanted to know if you could prescribe him another medication for his cholesterol.  Please Advise

## 2019-10-10 NOTE — Telephone Encounter (Signed)
I saw the note on June 3 by cardiology.  If the symptoms improved with stopping medication for 2 weeks, there was a plan to try back on crestor for just 2 days a week.  Did he try that approach?  Often times that can be better tolerated and then may not need to switch medications.  Let me know.

## 2019-10-11 NOTE — Telephone Encounter (Signed)
Unable to reach patient will try again later. If patient do call back please give patient below message.

## 2019-10-11 NOTE — Telephone Encounter (Signed)
Unable to reach pt phone continued to ring

## 2019-10-14 ENCOUNTER — Encounter: Payer: Self-pay | Admitting: Emergency Medicine

## 2019-10-14 NOTE — Progress Notes (Signed)
Letter has been mailed for patient to call our office. Unable to reach by phone

## 2019-10-14 NOTE — Telephone Encounter (Signed)
After several attempts. Letter has been mailed asking patient to call our office. When patient return call please give patient below msg

## 2019-10-18 NOTE — Telephone Encounter (Signed)
Patient responded to letter we sent asking him to call the office. Patient stated he have not tried taking the Crestor 2 times a week. So he was informed to try it twice a week and if the pain comes back to discontinue Crestor and give [place] a call and we will see what else we can do about sending in another medication. If he can tolerate twice a week continue until next office (01/2020)visit and we will f/u with labs at that time.

## 2019-10-23 ENCOUNTER — Ambulatory Visit: Payer: Medicare Other | Attending: Internal Medicine

## 2019-10-23 DIAGNOSIS — Z20822 Contact with and (suspected) exposure to covid-19: Secondary | ICD-10-CM

## 2019-10-24 LAB — NOVEL CORONAVIRUS, NAA: SARS-CoV-2, NAA: NOT DETECTED

## 2019-10-24 LAB — SARS-COV-2, NAA 2 DAY TAT

## 2019-12-10 ENCOUNTER — Telehealth: Payer: Self-pay | Admitting: Neurology

## 2019-12-10 NOTE — Telephone Encounter (Signed)
Reports an onset of daily headaches over the last two weeks. He is unsure of what brought this on. He has a longstanding history of neck pain that has not provoked headaches in the past. Although, his neck pain cannot be ruled out as a contributing factor, it would be best for him to be initially evaluated by his PCP in order to be seen sooner. He is agreeable to this plan.

## 2019-12-10 NOTE — Telephone Encounter (Signed)
Pt called stating that he has been having headaches lately and he is wanting to know if this is caused by the neck pains that he gets or if it has to do with his surgery. Please advise.

## 2019-12-10 NOTE — Telephone Encounter (Signed)
Left message requesting a call back.

## 2019-12-31 DIAGNOSIS — Z23 Encounter for immunization: Secondary | ICD-10-CM | POA: Diagnosis not present

## 2019-12-31 DIAGNOSIS — N401 Enlarged prostate with lower urinary tract symptoms: Secondary | ICD-10-CM | POA: Diagnosis not present

## 2020-01-02 ENCOUNTER — Other Ambulatory Visit: Payer: Self-pay

## 2020-01-02 ENCOUNTER — Ambulatory Visit (HOSPITAL_COMMUNITY)
Admission: EM | Admit: 2020-01-02 | Discharge: 2020-01-02 | Disposition: A | Discharge status: Home / Self Care | Payer: Medicare Other | Attending: Internal Medicine | Admitting: Internal Medicine

## 2020-01-02 ENCOUNTER — Encounter (HOSPITAL_COMMUNITY): Payer: Self-pay

## 2020-01-02 DIAGNOSIS — M1000 Idiopathic gout, unspecified site: Secondary | ICD-10-CM

## 2020-01-02 MED ORDER — PREDNISONE 20 MG PO TABS: 40.0000 mg | ORAL_TABLET | Freq: Every day | ORAL | 0 refills | Status: AC

## 2020-01-02 MED ORDER — ACETAMINOPHEN 325 MG PO TABS
ORAL_TABLET | ORAL | Status: AC
  Filled 2020-01-02: qty 2

## 2020-01-02 MED ORDER — ACETAMINOPHEN 325 MG PO TABS
650.0000 mg | ORAL_TABLET | Freq: Once | ORAL | Status: AC
  Administered 2020-01-02: 650 mg via ORAL

## 2020-01-02 NOTE — ED Triage Notes (Signed)
Patient in with c/o right foot pain and redness that started last Thursday Patient took tylenol last night with some relief  Also c/o on going headaches that started about 3 weeks ago without new vision changes, dizziness, or light headedness  Denies any numbness or tingling in foot

## 2020-01-02 NOTE — Discharge Instructions (Addendum)
Please take medications as prescribed If pain or redness worsens please return to the urgent care to be reevaluated.

## 2020-01-03 NOTE — ED Provider Notes (Signed)
[place]    CSN: [id number] Arrival date & time: 01/02/20  9833      History   Chief Complaint Chief Complaint  Patient presents with  . Foot Pain    HPI Demetre Monaco is a 74 y.o. male with a history of gout comes to the urgent care with 6 to 7-day history of right great toe pain.  Pain is throbbing, of moderate severity currently 9 out of 10, aggravated by palpation, not relieved fully by Tylenol and is associated with some redness.   No fever or chills.  No trauma to the foot.  Patient also complains of headaches over the past 3 weeks.  Patient is scheduled to meet primary care physician in a couple of days to address the headaches.  HPI  Past Medical History:  Diagnosis Date  . Arthritis   . BPH (benign prostatic hypertrophy)   . Coronary atherosclerosis of native coronary vessel    CATH 2004--  MINIMAL NONOBSTRUCTIVE LAD/  NORMAL EF/    CARDIAC CT  03/2008  NO SIGNIFICANT DISEASE  AND  nlef  . Elevated PSA   . Eye problem 03/06/2016   [name] retinal detachment left eye, no sx done  . Headache   . History of gout    pt 05-20-2013 states stable   . History of melanoma excision    2011-- LEFT UPPER BACK  . Hyperlipemia   . Hypertriglyceridemia   . Left bundle branch block    08/2017  . Melanoma ([place]) 2011   back  . Nonischemic cardiomyopathy ([place])    09-04-2009  --  2D echo-EF 40-45%, Global Hypokinesis - 09/2017 -> EF 45-50%   . Numbness    hands  . Personal history of arthritis   . Personal history of other diseases of circulatory system     Patient Active Problem List   Diagnosis Date Noted  . Claudication ([place]) 09/01/2019  . S/P cervical spinal fusion 04/20/2018  . Paresthesia 01/10/2018  . Neck pain 01/10/2018  . LBBB (left bundle branch block) 09/07/2017  . Other meniscus derangements, posterior horn of medial meniscus, left knee, insufficency fracture medial tibial plateau 04/07/2017  . Closed nondisplaced fracture of lateral  condyle of left tibia with routine healing 04/07/2017  . Status post arthroscopy of left knee 04/07/2017  . Insomnia 09/14/2015  . Hyperlipidemia with target LDL less than 100 09/11/2014  . Rotator cuff tendinitis 04/28/2014  . Obesity (BMI 30-39.9) 12/29/2013  . 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  . Exertional shortness of breath 06/26/2012  . Polyp of colon, adenomatous 04/27/2012  . Elevated PSA 10/20/2011  . Hypertriglyceridemia 07/08/2011  . Nonischemic cardiomyopathy ([place]) 09/04/2009    Past Surgical History:  Procedure Laterality Date  . ANTERIOR CERVICAL DECOMP/DISCECTOMY FUSION N/A 04/20/2018   Procedure: Anterior Cervical Decompression Fusion - Cervical Three-Cervical Four - Cervical Four-Cervical Five - Cervical Five-Cervical Six;  Surgeon: Eustace Moore, MD;  Location: [place];  Service: Neurosurgery;  Laterality: N/A;  Anterior Cervical Decompression Fusion - Cervical Three-Cervical Four - Cervical Four-Cervical Five - Cervical Five-Cervical Six  . APPENDECTOMY  AS CHILD  . BACK SURGERY    . CARDIAC CATHETERIZATION  05/23/2002  &  05-23-1998   DR AL LITTLE   minimal irregularities in the LAD/  EF 50%  . CARDIOVASCULAR STRESS TEST  10-05-1998   MILD GLOBAL HYPOKINESIS AND ISCHEMIAIN ANTEROSEPTAL  AT [facility]/ EF 42%  . CARPAL TUNNEL RELEASE Right   . EXCISION MELANOMA LEFT UPPER  BACK  10-14-2009  . eye lid surgery Bilateral 2018  . KNEE ARTHROSCOPY WITH SUBCHONDROPLASTY Left 04/07/2017   Procedure: LEFT KNEE ARTHROSCOPY WITH PARTIAL MEDIAL MENISCECTOMY AND MEDIAL TIBIAL SUBCHONDROPLASTY;  Surgeon: Mcarthur Rossetti, MD;  Location: [facility];  Service: Orthopedics;  Laterality: Left;  . knee injections Bilateral   . LUMBAR DISC SURGERY  09-12-2000   left  L5 -- S1  . PROSTATE BIOPSY N/A 05/22/2013   Procedure: BIOPSY TRANSRECTAL ULTRASONIC PROSTATE (TUBP);  Surgeon: [name], MD;  Location: [facility];  Service: Urology;  Laterality: N/A;  .  ROTATOR CUFF REPAIR Right 2016  . THROAT SURGERY  04/20/2018  . TRANSRECTAL ULTRASOUND PROSTATE BX  05-23-2005  &  04-19-2001  . TRANSTHORACIC ECHOCARDIOGRAM  09/2017   EF 45-50 % (previously reported as 40 to 45%).  Incoordinate septal motion with mild LVH.  GR 1 DD.  Aortic sclerosis but no stenosis.       Home Medications    Prior to Admission medications   Medication Sig Start Date End Date Taking? Authorizing Provider  aspirin EC 81 MG tablet Take 1 tablet (81 mg total) by mouth daily. 04/26/18  Yes Costella, Vista Mink, PA-C  gabapentin (NEURONTIN) 300 MG capsule TAKE 1 TO 2 CAPSULES BY  MOUTH 3 TIMES DAILY AS  NEEDED FOR PAIN. 12/01/18  Yes [name], MD  Garlic 3875 MG CAPS Take 1,000 mg by mouth daily.   Yes [provider]  Ginger, Zingiber officinalis, (GINGER ROOT) 550 MG CAPS Take 550 mg by mouth daily.   Yes [provider]  magnesium oxide (MAG-OX) 400 MG tablet Take 400 mg by mouth daily.   Yes [provider]  Multiple Vitamin (MULTIVITAMIN WITH MINERALS) TABS tablet Take 1 tablet by mouth daily. One-A-Day Multivitamin   Yes [provider]  Omega-3 Fatty Acids (FISH OIL) 500 MG CAPS Take 500 mg by mouth daily.   Yes [provider]  tamsulosin (FLOMAX) 0.4 MG CAPS capsule Take 0.4 mg by mouth.   Yes [provider]  Turmeric 500 MG TABS Take 500 mg by mouth daily.   Yes [provider]  vitamin B-12 (CYANOCOBALAMIN) 500 MCG tablet Take 500 mcg by mouth daily.   Yes [provider]  predniSONE (DELTASONE) 20 MG tablet Take 2 tablets (40 mg total) by mouth daily for 5 days. 01/02/20 01/07/20  Chase Picket, MD  sildenafil (VIAGRA) 100 MG tablet 1/2 to 1 tablet up to each day as needed. Patient taking differently: Take 50-100 mg by mouth daily as needed for erectile dysfunction.  07/08/11   [name], MD  Zinc 50 MG TABS Take 50 mg by mouth daily.    [provider]  rosuvastatin  (CRESTOR) 10 MG tablet Take 1 tablet (10 mg total) by mouth every evening. 08/15/19 01/02/20  [name], MD    Family History Family History  Problem Relation Age of Onset  . Arthritis Mother   . COPD Father   . Cancer Father        lung cancer  . Cancer Sister   . Cardiomyopathy Other        idiopathic. trivial disease 2004 cath, 2010 cardiac CT - no sig. dz, nl LV fxn. cards: [place]  . Colon cancer Neg Hx     Social History Social History   Tobacco Use  . Smoking status: Former Smoker    Packs/day: 2.00    Years: 20.00  Pack years: 40.00    Types: Cigarettes    Quit date: 03/28/1980    Years since quitting: 39.7  . Smokeless tobacco: Never Used  Vaping Use  . Vaping Use: Never used  Substance Use Topics  . Alcohol use: No    Alcohol/week: 0.0 standard drinks  . Drug use: No     Allergies   Nitroglycerin, Ace inhibitors, Lovastatin, and Solarcaine [benzocaine]   Review of Systems Review of Systems  Constitutional: Negative for activity change, chills and fever.  HENT: Negative.   Gastrointestinal: Negative.   Musculoskeletal: Positive for arthralgias and joint swelling. Negative for gait problem.  Skin: Negative.   Neurological: Negative.      Physical Exam Triage Vital Signs ED Triage Vitals  Enc Vitals Group     BP 01/02/20 1100 129/74     Pulse Rate 01/02/20 1100 77     Resp 01/02/20 1100 18     Temp 01/02/20 1100 97.7 F (36.5 C)     Temp Source 01/02/20 1100 Oral     SpO2 01/02/20 1100 98 %     Weight --      Height --      Head Circumference --      Peak Flow --      Pain Score 01/02/20 1101 9     Pain Loc --      Pain Edu? --      Excl. in [place]? --    No data found.  Updated Vital Signs BP 129/74 (BP Location: Right Arm)   Pulse 77   Temp 97.7 F (36.5 C) (Oral)   Resp 18   SpO2 98%   Visual Acuity Right Eye Distance:   Left Eye Distance:   Bilateral Distance:    Right Eye Near:   Left Eye Near:    Bilateral Near:      Physical Exam   UC Treatments / Results  Labs (all labs ordered are listed, but only abnormal results are displayed) Labs Reviewed - No data to display  EKG   Radiology No results found.  Procedures Procedures (including critical care time)  Medications Ordered in UC Medications  acetaminophen (TYLENOL) tablet 650 mg (650 mg Oral Given 01/02/20 1204)    Initial Impression / Assessment and Plan / UC Course  I have reviewed the triage vital signs and the nursing notes.  Pertinent labs & imaging results that were available during my care of the patient were reviewed by me and considered in my medical decision making (see chart for details).     1.  Acute gout flare: Prednisone 40 mg orally daily for 5 days Tylenol as needed for pain Return precautions given Once the flare subsides patient will benefit from a uric acid level and if elevated may benefit from allopurinol Return precautions given. Final Clinical Impressions(s) / UC Diagnoses   Final diagnoses:  Acute idiopathic gout, unspecified site     Discharge Instructions     Please take medications as prescribed If pain or redness worsens please return to the urgent care to be reevaluated.   ED Prescriptions    Medication Sig Dispense Auth. Provider   predniSONE (DELTASONE) 20 MG tablet Take 2 tablets (40 mg total) by mouth daily for 5 days. 10 tablet Alford Gamero, Myrene Galas, MD     PDMP not reviewed this encounter.   Chase Picket, MD 01/03/20 1534

## 2020-01-06 ENCOUNTER — Ambulatory Visit: Payer: Medicare Other | Admitting: Family Medicine

## 2020-01-06 DIAGNOSIS — R311 Benign essential microscopic hematuria: Secondary | ICD-10-CM | POA: Diagnosis not present

## 2020-01-06 DIAGNOSIS — R972 Elevated prostate specific antigen [PSA]: Secondary | ICD-10-CM | POA: Diagnosis not present

## 2020-01-06 DIAGNOSIS — N401 Enlarged prostate with lower urinary tract symptoms: Secondary | ICD-10-CM | POA: Diagnosis not present

## 2020-01-06 DIAGNOSIS — N5201 Erectile dysfunction due to arterial insufficiency: Secondary | ICD-10-CM | POA: Diagnosis not present

## 2020-01-06 DIAGNOSIS — R351 Nocturia: Secondary | ICD-10-CM | POA: Diagnosis not present

## 2020-01-08 ENCOUNTER — Other Ambulatory Visit: Payer: Self-pay

## 2020-01-08 ENCOUNTER — Ambulatory Visit (INDEPENDENT_AMBULATORY_CARE_PROVIDER_SITE_OTHER): Payer: Medicare Other | Admitting: Family Medicine

## 2020-01-08 ENCOUNTER — Encounter: Payer: Self-pay | Admitting: Family Medicine

## 2020-01-08 VITALS — BP 104/64 | Temp 98.1°F | Ht 70.0 in | Wt 198.2 lb

## 2020-01-08 DIAGNOSIS — R519 Headache, unspecified: Secondary | ICD-10-CM

## 2020-01-08 DIAGNOSIS — R7303 Prediabetes: Secondary | ICD-10-CM

## 2020-01-08 DIAGNOSIS — M4802 Spinal stenosis, cervical region: Secondary | ICD-10-CM | POA: Diagnosis not present

## 2020-01-08 DIAGNOSIS — M109 Gout, unspecified: Secondary | ICD-10-CM | POA: Diagnosis not present

## 2020-01-08 DIAGNOSIS — E785 Hyperlipidemia, unspecified: Secondary | ICD-10-CM

## 2020-01-08 MED ORDER — PREDNISONE 20 MG PO TABS: 40.0000 mg | ORAL_TABLET | Freq: Every day | ORAL | 0 refills | Status: DC

## 2020-01-08 NOTE — Progress Notes (Signed)
Subjective:  Patient ID: [name] [name], male    DOB: [birth year]  Age: 74 y.o. MRN: [id number]  CC:  Chief Complaint  Patient presents with  . Headache    off and on 3 months. right side and round the back of face   . Hyperlipidemia    f/u     HPI [name] [name] presents for   Hyperlipidemia: Previously on Crestor, but did have diffuse myalgias, cardiology recommended stopping medication which improved his symptoms.  He was seen by cardiologist June 3, history of nonischemic cardiomyopathy, LBBB B, mild aortic stenosis and claudication.  In July he had not taken medication in a few weeks.  No recent testing.  Option of Crestor 2 days/week/intermittent dosing to see if that was better tolerated. Pain improved off statin. Then restarted after taking crestor even 2 days per week for a few weeks. Off meds for past month or so.  Fasting today.   Lab Results  Component Value Date   CHOL 182 01/08/2020   HDL 41 01/08/2020   LDLCALC 95 01/08/2020   TRIG 271 (H) 01/08/2020   CHOLHDL 4.4 01/08/2020   Lab Results  Component Value Date   ALT 20 01/08/2020   AST 15 01/08/2020   ALKPHOS 71 01/08/2020   BILITOT 0.3 01/08/2020   Headache: Phone messages in September noted to neurology.  Daily headaches for a few weeks at that time.  Long history of neck pain that had provoked headaches in the past.  Plan for eval with me first. He has a history of cervical spine disease status post cervical fusion (ACDF of C3-C6, most recent imaging January 2020 disc spacer devices in good position) and has been treated with gabapentin.  Did have some radicular symptoms in 2020, carpal tunnel release in January.  Neurosurgeon Dr. Sherley Bounds.  Initially daily HA,  R side and upper scalp.some neck pain with movement. increased his gabapentin to 656m in am, 607mQpm. Has helped HA considerably. No new side effects on higher dose of gabapentin.    Gout: Last flare: 6 days [facility] 6 to 7-day history of right great toe pain.  Diagnosed with acute gout flare, treated with prednisone 40 mg daily x5 days, Tylenol as needed for pain.  Plan for uric acid level after current flare subsides. Daily meds: None Prn med: As above  Prednisone helped, stopped 2 days ago. Pain has returned some in past 2 days. tylenol on occasion. No side effects with prednisone. previous gout flare over a year ago.  Lab Results  Component Value Date   LABURIC 7.2 04/14/2014   prediabetes: A1c slightly increased in May, walking for exercise prior to gout flare, work on farm as well.   Wt Readings from Last 3 Encounters:  01/08/20 198 lb 3.2 oz (89.9 kg)  08/29/19 199 lb 3.2 oz (90.4 kg)  08/15/19 193 lb (87.5 kg)    Lab Results  Component Value Date   HGBA1C 6.3 (H) 01/08/2020   HGBA1C 6.3 (H) 08/15/2019   HGBA1C 6.1 (H) 02/14/2019     History Patient Active Problem List   Diagnosis Date Noted  . Claudication ([place]/08/2019  . S/P cervical spinal fusion 04/20/2018  . Paresthesia 01/10/2018  . Neck pain 01/10/2018  . LBBB (left bundle branch block) 09/07/2017  . Other meniscus derangements, posterior horn of medial meniscus, left knee, insufficency fracture medial tibial plateau 04/07/2017  . Closed nondisplaced fracture of lateral  condyle of left tibia with routine healing 04/07/2017  . Status post arthroscopy of left knee 04/07/2017  . Insomnia 09/14/2015  . Hyperlipidemia with target LDL less than 100 09/11/2014  . Rotator cuff tendinitis 04/28/2014  . Obesity (BMI 30-39.9) 12/29/2013  . 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  . Exertional shortness of breath 06/26/2012  . Polyp of colon, adenomatous 04/27/2012  . Elevated PSA 10/20/2011  . Hypertriglyceridemia 07/08/2011  . Nonischemic cardiomyopathy ([place]) 09/04/2009   Past Medical History:  Diagnosis Date  . Arthritis   . BPH (benign prostatic hypertrophy)   . Coronary atherosclerosis of  native coronary vessel    CATH 2004--  MINIMAL NONOBSTRUCTIVE LAD/  NORMAL EF/    CARDIAC CT  03/2008  NO SIGNIFICANT DISEASE  AND  nlef  . Elevated PSA   . Eye problem 03/06/2016   [name] retinal detachment left eye, no sx done  . Headache   . History of gout    pt 05-20-2013 states stable   . History of melanoma excision    2011-- LEFT UPPER BACK  . Hyperlipemia   . Hypertriglyceridemia   . Left bundle branch block    08/2017  . Melanoma ([place]) 2011   back  . Nonischemic cardiomyopathy ([place])    09-04-2009  --  2D echo-EF 40-45%, Global Hypokinesis - 09/2017 -> EF 45-50%   . Numbness    hands  . Personal history of arthritis   . Personal history of other diseases of circulatory system    Past Surgical History:  Procedure Laterality Date  . ANTERIOR CERVICAL DECOMP/DISCECTOMY FUSION N/A 04/20/2018   Procedure: Anterior Cervical Decompression Fusion - Cervical Three-Cervical Four - Cervical Four-Cervical Five - Cervical Five-Cervical Six;  Surgeon: Eustace Moore, MD;  Location: [place];  Service: Neurosurgery;  Laterality: N/A;  Anterior Cervical Decompression Fusion - Cervical Three-Cervical Four - Cervical Four-Cervical Five - Cervical Five-Cervical Six  . APPENDECTOMY  AS CHILD  . BACK SURGERY    . CARDIAC CATHETERIZATION  05/23/2002  &  05-23-1998   DR AL LITTLE   minimal irregularities in the LAD/  EF 50%  . CARDIOVASCULAR STRESS TEST  10-05-1998   MILD GLOBAL HYPOKINESIS AND ISCHEMIAIN ANTEROSEPTAL  AT [facility]/ EF 42%  . CARPAL TUNNEL RELEASE Right   . EXCISION MELANOMA LEFT UPPER BACK  10-14-2009  . eye lid surgery Bilateral 2018  . KNEE ARTHROSCOPY WITH SUBCHONDROPLASTY Left 04/07/2017   Procedure: LEFT KNEE ARTHROSCOPY WITH PARTIAL MEDIAL MENISCECTOMY AND MEDIAL TIBIAL SUBCHONDROPLASTY;  Surgeon: Mcarthur Rossetti, MD;  Location[facility];  Service: Orthopedics;  Laterality: Left;  . knee injections Bilateral   . LUMBAR DISC SURGERY  09-12-2000   left  L5 -- S1  .  PROSTATE BIOPSY N/A 05/22/2013   Procedure: BIOPSY TRANSRECTAL ULTRASONIC PROSTATE (TUBP);  Surgeon: [name], MD;  Location: [facility];  Service: Urology;  Laterality: N/A;  . ROTATOR CUFF REPAIR Right 2016  . THROAT SURGERY  04/20/2018  . TRANSRECTAL ULTRASOUND PROSTATE BX  05-23-2005  &  04-19-2001  . TRANSTHORACIC ECHOCARDIOGRAM  09/2017   EF 45-50 % (previously reported as 40 to 45%).  Incoordinate septal motion with mild LVH.  GR 1 DD.  Aortic sclerosis but no stenosis.   Allergies  Allergen Reactions  . Nitroglycerin Anaphylaxis    "Cardiologist suggested he shouldn't take this med because it could kill him with his heart condition. Lowers BP"  . Ace Inhibitors Swelling  . Lovastatin Nausea Only  .  Solarcaine [Benzocaine] Dermatitis   Prior to Admission medications   Medication Sig Start Date End Date Taking? Authorizing Provider  aspirin EC 81 MG tablet Take 1 tablet (81 mg total) by mouth daily. 04/26/18  Yes Costella, Vista Mink, PA-C  gabapentin (NEURONTIN) 300 MG capsule TAKE 1 TO 2 CAPSULES BY  MOUTH 3 TIMES DAILY AS  NEEDED FOR PAIN. 12/01/18  Yes [name], MD  Garlic 4132 MG CAPS Take 1,000 mg by mouth daily.   Yes [provider]  Ginger, Zingiber officinalis, (GINGER ROOT) 550 MG CAPS Take 550 mg by mouth daily.   Yes [provider]  magnesium oxide (MAG-OX) 400 MG tablet Take 400 mg by mouth daily.   Yes [provider]  Multiple Vitamin (MULTIVITAMIN WITH MINERALS) TABS tablet Take 1 tablet by mouth daily. One-A-Day Multivitamin   Yes [provider]  Omega-3 Fatty Acids (FISH OIL) 500 MG CAPS Take 500 mg by mouth daily.   Yes [provider]  sildenafil (VIAGRA) 100 MG tablet 1/2 to 1 tablet up to each day as needed. Patient taking differently: Take 50-100 mg by mouth daily as needed for erectile dysfunction.  07/08/11  Yes [name], MD  tamsulosin (FLOMAX) 0.4 MG CAPS capsule Take 0.4 mg  by mouth.   Yes [provider]  Turmeric 500 MG TABS Take 500 mg by mouth daily.   Yes [provider]  vitamin B-12 (CYANOCOBALAMIN) 500 MCG tablet Take 500 mcg by mouth daily.   Yes [provider]  Zinc 50 MG TABS Take 50 mg by mouth daily.   Yes [provider]  rosuvastatin (CRESTOR) 10 MG tablet Take 1 tablet (10 mg total) by mouth every evening. 08/15/19 01/02/20  [name], MD   Social History   Socioeconomic History  . Marital status: Widowed    Spouse name: Not on file  . Number of children: 2  . Years of education: 22  . Highest education level: High school graduate  Occupational History  . 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  Tobacco Use  . Smoking status: Former Smoker    Packs/day: 2.00    Years: 20.00    Pack years: 40.00    Types: Cigarettes    Quit date: 03/28/1980    Years since quitting: 39.8  . Smokeless tobacco: Never Used  Vaping Use  . Vaping Use: Never used  Substance and Sexual Activity  . Alcohol use: No    Alcohol/week: 0.0 standard drinks  . Drug use: No  . Sexual activity: Not on file  Other Topics Concern  . Not on file  Social History Narrative   He walks on a relatively regular basis about 15 minutes at a time mostly to the discomfort. He previously had walked up a 30-40 minutes a time without problems. Education: [employer].   Lives alone.   Right-handed.   One cup coffee daily.   Social Determinants of Health   Financial Resource Strain:   . Difficulty of Paying Living Expenses: Not on file  Food Insecurity:   . Worried About Charity fundraiser in the Last Year: Not on file  . Ran Out of Food in the Last Year: Not on file  Transportation Needs:   . Lack of Transportation (Medical): Not on file  . Lack of Transportation (Non-Medical): Not on file  Physical Activity:   . Days of Exercise per Week: Not on file  .  Minutes of Exercise per Session:  Not on file  Stress:   . Feeling of Stress : Not on file  Social Connections:   . Frequency of Communication with Friends and Family: Not on file  . Frequency of Social Gatherings with Friends and Family: Not on file  . Attends Religious Services: Not on file  . Active Member of Clubs or Organizations: Not on file  . Attends Archivist Meetings: Not on file  . Marital Status: Not on file  Intimate Partner Violence:   . Fear of Current or Ex-Partner: Not on file  . Emotionally Abused: Not on file  . Physically Abused: Not on file  . Sexually Abused: Not on file    Review of Systems Per HPI  Objective:   Vitals:   01/08/20 1046  BP: 104/64  Temp: 98.1 F (36.7 C)  TempSrc: Temporal  SpO2: 96%  Weight: 198 lb 3.2 oz (89.9 kg)  Height: 5' 10"  (1.778 m)     Physical Exam Vitals reviewed.  Constitutional:      Appearance: He is well-developed.  HENT:     Head: Normocephalic and atraumatic.  Eyes:     Pupils: Pupils are equal, round, and reactive to light.  Neck:     Vascular: No carotid bruit or JVD.  Cardiovascular:     Rate and Rhythm: Normal rate and regular rhythm.     Heart sounds: Normal heart sounds. No murmur heard.   Pulmonary:     Effort: Pulmonary effort is normal.     Breath sounds: Normal breath sounds. No rales.  Musculoskeletal:     Comments: Some discomfort with C-spine range of motion, decreased range of motion.  Scalp nontender, no rash.  Right first MTP, slight warmth, faint erythema, slight tenderness to palpation.  No open wounds.  Skin:    General: Skin is warm and dry.  Neurological:     General: No focal deficit present.     Mental Status: He is alert and oriented to person, place, and time.  Psychiatric:        Mood and Affect: Mood normal.        Behavior: Behavior normal.        Assessment & Plan:  [name] [name] is a 74 y.o. male . Hyperlipidemia, unspecified hyperlipidemia type - Plan: Lipid panel,  CMP14+EGFR  -Check labs, depending on results may need to discuss other regimen with cardiology intolerant to statin.   Prediabetes - Plan: Hemoglobin A1c  -Check A1c, diet exercise approach discussed.  Right-sided headache Spinal stenosis in cervical region  -Suspect headache due to spinal stenosis, cervical issues.  Reassuring exam.  Better on higher dose of gabapentin, okay to continue same.  If persistent neck pain, recommended follow-up with his neurosurgeon.  If persistent headache, follow-up with neuro.  Nonfocal exam.  Gout of right foot, unspecified cause, unspecified chronicity - Plan: predniSONE (DELTASONE) 20 MG tablet  -Improving, extend prednisone 3 additional days.  RTC precautions  Meds ordered this encounter  Medications  . predniSONE (DELTASONE) 20 MG tablet    Sig: Take 2 tablets (40 mg total) by mouth daily with breakfast.    Dispense:  6 tablet    Refill:  0   Patient Instructions    I will check cholesterol today as you are off meds. Depending on those results, can discuss other options with your cardiologist.   3 more days of prednisone should help gout to subside. Tylenol is ok if needed. Recheck  in December for gout test.   Ok to continue the higher dose of gabapentin, but if continue neck pain, I would recommend calling your neurosurgeon - Dr. Ronnald Ramp. If return of headache then follow up with Dr. Krista Blue.   Return to the clinic or go to the nearest emergency room if any of your symptoms worsen or new symptoms occur.   If you have lab work done today you will be contacted with your lab results within the next 2 weeks.  If you have not heard from [place] then please contact us. The fastest way to get your results is to register for My Chart.   IF you received an x-ray today, you will receive an invoice from [facility]. Please contact [facility] at [phone] with questions or concerns regarding your invoice.   IF you received labwork today, you  will receive an invoice from [place]. Please contact [facility] at [phone] with questions or concerns regarding your invoice.   Our billing staff will not be able to assist you with questions regarding bills from these companies.  You will be contacted with the lab results as soon as they are available. The fastest way to get your results is to activate your My Chart account. Instructions are located on the last page of this paperwork. If you have not heard from [place] regarding the results in 2 weeks, please contact this office.          Signed, [name], MD [facility]

## 2020-01-08 NOTE — Patient Instructions (Addendum)
  I will check cholesterol today as you are off meds. Depending on those results, can discuss other options with your cardiologist.   3 more days of prednisone should help gout to subside. Tylenol is ok if needed. Recheck in December for gout test.   Ok to continue the higher dose of gabapentin, but if continue neck pain, I would recommend calling your neurosurgeon - Dr. Ronnald Ramp. If return of headache then follow up with Dr. Krista Blue.   Return to the clinic or go to the nearest emergency room if any of your symptoms worsen or new symptoms occur.   If you have lab work done today you will be contacted with your lab results within the next 2 weeks.  If you have not heard from [place] then please contact us. The fastest way to get your results is to register for My Chart.   IF you received an x-ray today, you will receive an invoice from [facility]. Please contact [facility] at [phone] with questions or concerns regarding your invoice.   IF you received labwork today, you will receive an invoice from [facility]. Please contact [facility] at [phone] with questions or concerns regarding your invoice.   Our billing staff will not be able to assist you with questions regarding bills from these companies.  You will be contacted with the lab results as soon as they are available. The fastest way to get your results is to activate your My Chart account. Instructions are located on the last page of this paperwork. If you have not heard from [place] regarding the results in 2 weeks, please contact this office.

## 2020-01-09 LAB — CMP14+EGFR
ALT: 20 IU/L (ref 0–44)
AST: 15 IU/L (ref 0–40)
Albumin/Globulin Ratio: 1.6 (ref 1.2–2.2)
Albumin: 4.4 g/dL (ref 3.7–4.7)
Alkaline Phosphatase: 71 IU/L (ref 44–121)
BUN/Creatinine Ratio: 24 (ref 10–24)
BUN: 24 mg/dL (ref 8–27)
Bilirubin Total: 0.3 mg/dL (ref 0.0–1.2)
CO2: 26 mmol/L (ref 20–29)
Calcium: 9.4 mg/dL (ref 8.6–10.2)
Chloride: 101 mmol/L (ref 96–106)
Creatinine, Ser: 1.02 mg/dL (ref 0.76–1.27)
GFR calc Af Amer: 83 mL/min/{1.73_m2} (ref >59)
GFR calc non Af Amer: 72 mL/min/{1.73_m2} (ref >59)
Globulin, Total: 2.8 g/dL (ref 1.5–4.5)
Glucose: 95 mg/dL (ref 65–99)
Potassium: 4.6 mmol/L (ref 3.5–5.2)
Sodium: 140 mmol/L (ref 134–144)
Total Protein: 7.2 g/dL (ref 6.0–8.5)

## 2020-01-09 LAB — LIPID PANEL
Chol/HDL Ratio: 4.4 ratio (ref 0.0–5.0)
Cholesterol, Total: 182 mg/dL (ref 100–199)
HDL: 41 mg/dL (ref >39)
LDL Chol Calc (NIH): 95 mg/dL (ref 0–99)
Triglycerides: 271 mg/dL — ABNORMAL HIGH (ref 0–149)
VLDL Cholesterol Cal: 46 mg/dL — ABNORMAL HIGH (ref 5–40)

## 2020-01-09 LAB — HEMOGLOBIN A1C
Est. average glucose Bld gHb Est-mCnc: 134 mg/dL
Hgb A1c MFr Bld: 6.3 % — ABNORMAL HIGH (ref 4.8–5.6)

## 2020-01-11 ENCOUNTER — Encounter: Payer: Self-pay | Admitting: Family Medicine

## 2020-01-23 ENCOUNTER — Other Ambulatory Visit: Payer: Self-pay | Admitting: Family Medicine

## 2020-01-23 DIAGNOSIS — E785 Hyperlipidemia, unspecified: Secondary | ICD-10-CM

## 2020-02-06 ENCOUNTER — Other Ambulatory Visit: Payer: Self-pay | Admitting: Family Medicine

## 2020-02-06 DIAGNOSIS — G6289 Other specified polyneuropathies: Secondary | ICD-10-CM

## 2020-02-06 MED ORDER — GABAPENTIN 300 MG PO CAPS: ORAL_CAPSULE | ORAL | 0 refills | Status: DC

## 2020-02-06 MED ORDER — TAMSULOSIN HCL 0.4 MG PO CAPS: 0.4000 mg | ORAL_CAPSULE | Freq: Every day | ORAL | 1 refills | Status: DC

## 2020-02-06 NOTE — Telephone Encounter (Signed)
[name] [name] with [facility] called asking for refills on  Tamsulosin .04 mg  Gabapinton 300 mg

## 2020-02-06 NOTE — Telephone Encounter (Signed)
   Notes to clinic:  medication was filled by a historical provider  Review for refill    Requested Prescriptions  Pending Prescriptions Disp Refills   tamsulosin (FLOMAX) 0.4 MG CAPS capsule 30 capsule     Sig: Take 1 capsule (0.4 mg total) by mouth.      Urology: Alpha-Adrenergic Blocker Passed - 02/06/2020 12:15 PM      Passed - Last BP in normal range    BP Readings from Last 1 Encounters:  01/08/20 104/64          Passed - Valid encounter within last 12 months    Recent Outpatient Visits           4 weeks ago Hyperlipidemia, unspecified hyperlipidemia type   [facility] [place], Ranell Patrick, MD   5 months ago Hyperlipidemia, unspecified hyperlipidemia type   [facility] [place], Ranell Patrick, MD   6 months ago Medicare annual wellness visit, subsequent   [facility] [place], Ranell Patrick, MD   11 months ago Bilateral hand numbness   [facility] [place] [facility], Ranell Patrick, MD   1 year ago Prediabetes   [facility] [place] [facility], Ranell Patrick, MD       Future Appointments             In 3 weeks [name], MD [facility] [place], [facility]             Signed Prescriptions Disp Refills   gabapentin (NEURONTIN) 300 MG capsule 540 capsule 0    Sig: TAKE 1 TO 2 CAPSULES BY  MOUTH 3 TIMES DAILY AS  NEEDED FOR PAIN.      Neurology: Anticonvulsants - gabapentin Passed - 02/06/2020 12:15 PM      Passed - Valid encounter within last 12 months    Recent Outpatient Visits           4 weeks ago Hyperlipidemia, unspecified hyperlipidemia type   [facility] [place], MD   5 months ago Hyperlipidemia, unspecified hyperlipidemia type   [facility] [place] [facility], Ranell Patrick, MD   6 months ago Medicare annual wellness visit, subsequent   [facility] [place], MD   11 months ago Bilateral hand numbness   [facility], Ranell Patrick, MD   1 year ago Prediabetes   [facility] [place] [facility], Ranell Patrick, MD       Future Appointments             In 3 weeks Carlota Raspberry Ranell Patrick, MD [facility] at [place], [facility]

## 2020-02-06 NOTE — Telephone Encounter (Signed)
Has an upcoming appt

## 2020-02-06 NOTE — Telephone Encounter (Signed)
tamsulosin (FLOMAX) 0.4 MG CAPS capsule [[id number]]  [facility] - [place], [place]  [place] [address]  Phone: [phone] Fax: [phone]

## 2020-02-06 NOTE — Telephone Encounter (Signed)
   Notes to clinic: Patient wants Dr.Greene to fill this medication    Requested Prescriptions  Pending Prescriptions Disp Refills   tamsulosin (FLOMAX) 0.4 MG CAPS capsule 30 capsule     Sig: Take 1 capsule (0.4 mg total) by mouth.      Urology: Alpha-Adrenergic Blocker Passed - 02/06/2020  2:30 PM      Passed - Last BP in normal range    BP Readings from Last 1 Encounters:  01/08/20 104/64          Passed - Valid encounter within last 12 months    Recent Outpatient Visits           4 weeks ago Hyperlipidemia, unspecified hyperlipidemia type   [facility] [place], MD   5 months ago Hyperlipidemia, unspecified hyperlipidemia type   [facility] [place] [facility], Ranell Patrick, MD   6 months ago Medicare annual wellness visit, subsequent   [facility] [place], Ranell Patrick, MD   11 months ago Bilateral hand numbness   [facility] [place], Ranell Patrick, MD   1 year ago Prediabetes   [facility] [place], Ranell Patrick, MD       Future Appointments             In 3 weeks Carlota Raspberry Ranell Patrick, MD [facility] at [place], [facility]

## 2020-02-06 NOTE — Addendum Note (Signed)
Addended by: Denman George on: 02/06/2020 02:30 PM   Modules accepted: Orders

## 2020-02-08 ENCOUNTER — Other Ambulatory Visit: Payer: Self-pay | Admitting: Family Medicine

## 2020-02-08 DIAGNOSIS — E785 Hyperlipidemia, unspecified: Secondary | ICD-10-CM

## 2020-02-21 ENCOUNTER — Ambulatory Visit: Payer: Medicare Other | Admitting: Family Medicine

## 2020-02-27 ENCOUNTER — Other Ambulatory Visit: Payer: Self-pay

## 2020-02-27 ENCOUNTER — Ambulatory Visit (INDEPENDENT_AMBULATORY_CARE_PROVIDER_SITE_OTHER): Payer: Medicare Other | Admitting: Family Medicine

## 2020-02-27 ENCOUNTER — Encounter: Payer: Self-pay | Admitting: Family Medicine

## 2020-02-27 VITALS — BP 110/60 | HR 90 | Temp 98.0°F | Ht 70.0 in | Wt 197.0 lb

## 2020-02-27 DIAGNOSIS — M109 Gout, unspecified: Secondary | ICD-10-CM

## 2020-02-27 DIAGNOSIS — E781 Pure hyperglyceridemia: Secondary | ICD-10-CM

## 2020-02-27 DIAGNOSIS — R7303 Prediabetes: Secondary | ICD-10-CM

## 2020-02-27 NOTE — Patient Instructions (Addendum)
I will recheck the triglycerides and your cholesterol level today as you are fasting.  Previous reading could have been elevated if not fasting.  Elevated blood sugar/prediabetes can also affect the reading, see information below on prediabetes.  Recheck prediabetes test in the next 3 to 6 months at the most.  I will check the gout test, but if any return of gout flare, make sure to be seen as we may need to consider a daily medication.  Thank you for coming in today and take care.     Prediabetes Prediabetes is the condition of having a blood sugar (blood glucose) level that is higher than it should be, but not high enough for you to be diagnosed with type 2 diabetes. Having prediabetes puts you at risk for developing type 2 diabetes (type 2 diabetes mellitus). Prediabetes may be called impaired glucose tolerance or impaired fasting glucose. Prediabetes usually does not cause symptoms. Your health care provider can diagnose this condition with blood tests. You may be tested for prediabetes if you are overweight and if you have at least one other risk factor for prediabetes. What is blood glucose, and how is it measured? Blood glucose refers to the amount of glucose in your bloodstream. Glucose comes from eating foods that contain sugars and starches (carbohydrates), which the body breaks down into glucose. Your blood glucose level may be measured in mg/dL (milligrams per deciliter) or mmol/L (millimoles per liter). Your blood glucose may be checked with one or more of the following blood tests:  A fasting blood glucose (FBG) test. You will not be allowed to eat (you will fast) for 8 hours or longer before a blood sample is taken. ? A normal range for FBG is 70-100 mg/dl (3.9-5.6 mmol/L).  An A1c (hemoglobin A1c) blood test. This test provides information about blood glucose control over the previous 2?40months.  An oral glucose tolerance test (OGTT). This test measures your blood glucose at two  times: ? After fasting. This is your baseline level. ? Two hours after you drink a beverage that contains glucose. You may be diagnosed with prediabetes:  If your FBG is 100?125 mg/dL (5.6-6.9 mmol/L).  If your A1c level is 5.7?6.4%.  If your OGTT result is 140?199 mg/dL (7.8-11 mmol/L). These blood tests may be repeated to confirm your diagnosis. How can this condition affect me? The pancreas produces a hormone (insulin) that helps to move glucose from the bloodstream into cells. When cells in the body do not respond properly to insulin that the body makes (insulin resistance), excess glucose builds up in the blood instead of going into cells. As a result, high blood glucose (hyperglycemia) can develop, which can cause many complications. Hyperglycemia is a symptom of prediabetes. Having high blood glucose for a long time is dangerous. Too much glucose in your blood can damage your nerves and blood vessels. Long-term damage can lead to complications from diabetes, which may include:  Heart disease.  Stroke.  Blindness.  Kidney disease.  Depression.  Poor circulation in the feet and legs, which could lead to surgical removal (amputation) in severe cases. What can increase my risk? Risk factors for prediabetes include:  Having a family member with type 2 diabetes.  Being overweight or obese.  Being older than age 28.  Being of American [place], African-American, Hispanic/Latino, or Asian/Pacific Islander descent.  Having an inactive (sedentary) lifestyle.  Having a history of heart disease.  History of gestational diabetes or polycystic ovary syndrome (PCOS), in women.  Having low levels of good cholesterol (HDL-C) or high levels of blood fats (triglycerides).  Having high blood pressure. What actions can I take to prevent diabetes?      Be physically active. ? Do moderate-intensity physical activity for 30 or more minutes on 5 or more days of the week, or as much as  told by your health care provider. This could be brisk walking, biking, or water aerobics. ? Ask your health care provider what activities are safe for you. A mix of physical activities may be best, such as walking, swimming, cycling, and strength training.  Lose weight as told by your health care provider. ? Losing 5-7% of your body weight can reverse insulin resistance. ? Your health care provider can determine how much weight loss is best for you and can help you lose weight safely.  Follow a healthy meal plan. This includes eating lean proteins, complex carbohydrates, fresh fruits and vegetables, low-fat dairy products, and healthy fats. ? Follow instructions from your health care provider about eating or drinking restrictions. ? Make an appointment to see a diet and nutrition specialist (registered dietitian) to help you create a healthy eating plan that is right for you.  Do not smoke or use any tobacco products, such as cigarettes, chewing tobacco, and e-cigarettes. If you need help quitting, ask your health care provider.  Take over-the-counter and prescription medicines as told by your health care provider. You may be prescribed medicines that help lower the risk of type 2 diabetes.  Keep all follow-up visits as told by your health care provider. This is important. Summary  Prediabetes is the condition of having a blood sugar (blood glucose) level that is higher than it should be, but not high enough for you to be diagnosed with type 2 diabetes.  Having prediabetes puts you at risk for developing type 2 diabetes (type 2 diabetes mellitus).  To help prevent type 2 diabetes, make lifestyle changes such as being physically active and eating a healthy diet. Lose weight as told by your health care provider. This information is not intended to replace advice given to you by your health care provider. Make sure you discuss any questions you have with your health care provider. Document  Revised: 07/06/2018 Document Reviewed: 05/05/2015 Elsevier Patient Education  [employer].     If you have lab work done today you will be contacted with your lab results within the next 2 weeks.  If you have not heard from [place] then please contact us. The fastest way to get your results is to register for My Chart.   IF you received an x-ray today, you will receive an invoice from [facility]. Please contact [facility] at [phone] with questions or concerns regarding your invoice.   IF you received labwork today, you will receive an invoice from [facility]. Please contact [facility] at [phone] with questions or concerns regarding your invoice.   Our billing staff will not be able to assist you with questions regarding bills from these companies.  You will be contacted with the lab results as soon as they are available. The fastest way to get your results is to activate your My Chart account. Instructions are located on the last page of this paperwork. If you have not heard from [place] regarding the results in 2 weeks, please contact this office.

## 2020-02-27 NOTE — Progress Notes (Signed)
Subjective:  Patient ID: [name], male    DOB: 06/11/1945  Age: 74 y.o. MRN: [id number]  CC:  Chief Complaint  Patient presents with  . Follow-up    on hyperlipidemia. pt reports he had to stop taking his Crestor medication due to it giving him body aches. Pt states he hasn't taken it in 2 months now. Pt reports no phyiscal symptoms of this condition at this time.Pt is fasting.    HPI Nyzier Boivin presents for   Hyperlipidemia: Follow-up from October visit. Previously had tried Crestor but had diffuse myalgias. Intermittent dosing also given as an option, but return of myalgias, he was off statin as of October 13 visit for a month or 2. Hypertriglyceridemia noted with slight elevation in October, but also with underlying prediabetes. 3 to 6 months rechecking levels recommended after watching diet/exercise. Unsure if he was truly fasting for 8 hrs in October, he is fasting today. Wt Readings from Last 3 Encounters:  02/27/20 197 lb (89.4 kg)  01/08/20 198 lb 3.2 oz (89.9 kg)  08/29/19 199 lb 3.2 oz (90.4 kg)  no changes in diet/exercise since last visit.  Lab Results  Component Value Date   HGBA1C 6.3 (H) 01/08/2020    Lab Results  Component Value Date   CHOL 182 01/08/2020   HDL 41 01/08/2020   LDLCALC 95 01/08/2020   TRIG 271 (H) 01/08/2020   CHOLHDL 4.4 01/08/2020   Lab Results  Component Value Date   ALT 20 01/08/2020   AST 15 01/08/2020   ALKPHOS 71 01/08/2020   BILITOT 0.3 01/08/2020    Gout: Last flare: flare in October, treated with prednisone, 3 additional days at 10/13 visit. No flares since  Daily meds: none.  Prn med: prednisone above  - has not had in years. Lab Results  Component Value Date   LABURIC 7.2 04/14/2014     History Patient Active Problem List   Diagnosis Date Noted  . Claudication ([place]) 09/01/2019  . S/P cervical spinal fusion 04/20/2018  . Paresthesia 01/10/2018  . Neck pain 01/10/2018  . LBBB (left bundle branch  block) 09/07/2017  . Other meniscus derangements, posterior horn of medial meniscus, left knee, insufficency fracture medial tibial plateau 04/07/2017  . Closed nondisplaced fracture of lateral condyle of left tibia with routine healing 04/07/2017  . Status post arthroscopy of left knee 04/07/2017  . Insomnia 09/14/2015  . Hyperlipidemia with target LDL less than 100 09/11/2014  . Rotator cuff tendinitis 04/28/2014  . Obesity (BMI 30-39.9) 12/29/2013  . 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  . Exertional shortness of breath 06/26/2012  . Polyp of colon, adenomatous 04/27/2012  . Elevated PSA 10/20/2011  . Hypertriglyceridemia 07/08/2011  . Nonischemic cardiomyopathy ([place]) 09/04/2009   Past Medical History:  Diagnosis Date  . Arthritis   . BPH (benign prostatic hypertrophy)   . Coronary atherosclerosis of native coronary vessel    CATH 2004--  MINIMAL NONOBSTRUCTIVE LAD/  NORMAL EF/    CARDIAC CT  03/2008  NO SIGNIFICANT DISEASE  AND  nlef  . Elevated PSA   . Eye problem 03/06/2016   [name] retinal detachment left eye, no sx done  . Headache   . History of gout    pt 05-20-2013 states stable   . History of melanoma excision    2011-- LEFT UPPER BACK  . Hyperlipemia   . Hypertriglyceridemia   . Left bundle branch block    08/2017  . Melanoma ([place]) 2011  back  . Nonischemic cardiomyopathy ([place])    09-04-2009  --  2D echo-EF 40-45%, Global Hypokinesis - 09/2017 -> EF 45-50%   . Numbness    hands  . Personal history of arthritis   . Personal history of other diseases of circulatory system    Past Surgical History:  Procedure Laterality Date  . ANTERIOR CERVICAL DECOMP/DISCECTOMY FUSION N/A 04/20/2018   Procedure: Anterior Cervical Decompression Fusion - Cervical Three-Cervical Four - Cervical Four-Cervical Five - Cervical Five-Cervical Six;  Surgeon: Eustace Moore, MD;  Location: [facility];  Service: Neurosurgery;  Laterality: N/A;  Anterior Cervical Decompression Fusion -  Cervical Three-Cervical Four - Cervical Four-Cervical Five - Cervical Five-Cervical Six  . APPENDECTOMY  AS CHILD  . BACK SURGERY    . CARDIAC CATHETERIZATION  05/23/2002  &  05-23-1998   DR AL LITTLE   minimal irregularities in the LAD/  EF 50%  . CARDIOVASCULAR STRESS TEST  10-05-1998   MILD GLOBAL HYPOKINESIS AND ISCHEMIAIN ANTEROSEPTAL  AT [facility]/ EF 42%  . CARPAL TUNNEL RELEASE Right   . EXCISION MELANOMA LEFT UPPER BACK  10-14-2009  . eye lid surgery Bilateral 2018  . KNEE ARTHROSCOPY WITH SUBCHONDROPLASTY Left 04/07/2017   Procedure: LEFT KNEE ARTHROSCOPY WITH PARTIAL MEDIAL MENISCECTOMY AND MEDIAL TIBIAL SUBCHONDROPLASTY;  Surgeon: Mcarthur Rossetti, MD;  Location: [facility];  Service: Orthopedics;  Laterality: Left;  . knee injections Bilateral   . LUMBAR DISC SURGERY  09-12-2000   left  L5 -- S1  . PROSTATE BIOPSY N/A 05/22/2013   Procedure: BIOPSY TRANSRECTAL ULTRASONIC PROSTATE (TUBP);  Surgeon: [name], MD;  Location: [facility];  Service: Urology;  Laterality: N/A;  . ROTATOR CUFF REPAIR Right 2016  . THROAT SURGERY  04/20/2018  . TRANSRECTAL ULTRASOUND PROSTATE BX  05-23-2005  &  04-19-2001  . TRANSTHORACIC ECHOCARDIOGRAM  09/2017   EF 45-50 % (previously reported as 40 to 45%).  Incoordinate septal motion with mild LVH.  GR 1 DD.  Aortic sclerosis but no stenosis.   Allergies  Allergen Reactions  . Nitroglycerin Anaphylaxis    "Cardiologist suggested he shouldn't take this med because it could kill him with his heart condition. Lowers BP"  . Crestor [Rosuvastatin] Other (See Comments)    Body aches  . Ace Inhibitors Swelling  . Lovastatin Nausea Only  . Solarcaine [Benzocaine] Dermatitis   Prior to Admission medications   Medication Sig Start Date End Date Taking? Authorizing Provider  aspirin EC 81 MG tablet Take 1 tablet (81 mg total) by mouth daily. 04/26/18  Yes Costella, Vista Mink, PA-C  gabapentin (NEURONTIN) 300 MG capsule TAKE 1 TO 2  CAPSULES BY  MOUTH 3 TIMES DAILY AS  NEEDED FOR PAIN. 02/06/20  Yes [name], MD  Garlic 1914 MG CAPS Take 1,000 mg by mouth daily.   Yes [provider]  magnesium oxide (MAG-OX) 400 MG tablet Take 400 mg by mouth daily.   Yes [provider]  Multiple Vitamin (MULTIVITAMIN WITH MINERALS) TABS tablet Take 1 tablet by mouth daily. One-A-Day Multivitamin   Yes [provider]  Omega-3 Fatty Acids (FISH OIL) 500 MG CAPS Take 500 mg by mouth daily.   Yes [provider]  sildenafil (VIAGRA) 100 MG tablet 1/2 to 1 tablet up to each day as needed. Patient taking differently: Take 50-100 mg by mouth daily as needed for erectile dysfunction.  07/08/11  Yes [name], MD  Turmeric 500 MG TABS Take 500 mg  by mouth daily.   Yes [provider]  vitamin B-12 (CYANOCOBALAMIN) 500 MCG tablet Take 500 mcg by mouth daily.   Yes [provider]  Zinc 50 MG TABS Take 50 mg by mouth daily.   Yes [provider]  Ginger, Zingiber officinalis, (GINGER ROOT) 550 MG CAPS Take 550 mg by mouth daily. Patient not taking: Reported on 02/27/2020    [provider]  predniSONE (DELTASONE) 20 MG tablet Take 2 tablets (40 mg total) by mouth daily with breakfast. Patient not taking: Reported on 02/27/2020 01/08/20   [name], MD  tamsulosin (FLOMAX) 0.4 MG CAPS capsule Take 1 capsule (0.4 mg total) by mouth daily. Patient not taking: Reported on 02/27/2020 02/06/20   [name], MD  rosuvastatin (CRESTOR) 10 MG tablet Take 1 tablet (10 mg total) by mouth every evening. 08/15/19 01/02/20  [name], MD   Social History   Socioeconomic History  . Marital status: Widowed    Spouse name: Not on file  . Number of children: 2  . Years of education: 23  . Highest education level: High school graduate  Occupational History  . 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  Tobacco Use  .  Smoking status: Former Smoker    Packs/day: 2.00    Years: 20.00    Pack years: 40.00    Types: Cigarettes    Quit date: 03/28/1980    Years since quitting: 39.9  . Smokeless tobacco: Never Used  Vaping Use  . Vaping Use: Never used  Substance and Sexual Activity  . Alcohol use: No    Alcohol/week: 0.0 standard drinks  . Drug use: No  . Sexual activity: Not on file  Other Topics Concern  . Not on file  Social History Narrative   He walks on a relatively regular basis about 15 minutes at a time mostly to the discomfort. He previously had walked up a 30-40 minutes a time without problems. Education: [employer].   Lives alone.   Right-handed.   One cup coffee daily.   Social Determinants of Health   Financial Resource Strain:   . Difficulty of Paying Living Expenses: Not on file  Food Insecurity:   . Worried About Charity fundraiser in the Last Year: Not on file  . Ran Out of Food in the Last Year: Not on file  Transportation Needs:   . Lack of Transportation (Medical): Not on file  . Lack of Transportation (Non-Medical): Not on file  Physical Activity:   . Days of Exercise per Week: Not on file  . Minutes of Exercise per Session: Not on file  Stress:   . Feeling of Stress : Not on file  Social Connections:   . Frequency of Communication with Friends and Family: Not on file  . Frequency of Social Gatherings with Friends and Family: Not on file  . Attends Religious Services: Not on file  . Active Member of Clubs or Organizations: Not on file  . Attends Archivist Meetings: Not on file  . Marital Status: Not on file  Intimate Partner Violence:   . Fear of Current or Ex-Partner: Not on file  . Emotionally Abused: Not on file  . Physically Abused: Not on file  . Sexually Abused: Not on file    Review of Systems   Objective:   Vitals:   02/27/20 0958  BP: 110/60  Pulse: 90  Temp: 98 F (36.7  C)  TempSrc: Temporal  SpO2: 98%  Weight: 197 lb (89.4 kg)   Height: 5\' 10"  (1.778 m)     Physical Exam Constitutional:      General: He is not in acute distress.    Appearance: He is well-developed.  HENT:     Head: Normocephalic and atraumatic.  Cardiovascular:     Rate and Rhythm: Normal rate and regular rhythm.  Pulmonary:     Effort: Pulmonary effort is normal. No respiratory distress.     Breath sounds: No wheezing or rales.  Neurological:     Mental Status: He is alert and oriented to person, place, and time.        Assessment & Plan:  Eh Sesay is a 74 y.o. male . Hypertriglyceridemia - Plan: Lipid panel  -Unsure if fasting last visit, may also be component of prediabetes with hypertriglyceridemia.  Intolerant to statins.  Repeat lipid panel.  Prediabetes  Diet/low intensity/exercise discussed for weight loss.  Handout given.  Check A1c in 3 to 6 months.  Gout of right foot, unspecified cause, unspecified chronicity - Plan: Uric Acid  -No flares since last visit, as no recent flare will check uric acid level, consider daily med if recurrent flares or elevated uric acid.  No orders of the defined types were placed in this encounter.  Patient Instructions   I will recheck the triglycerides and your cholesterol level today as you are fasting.  Previous reading could have been elevated if not fasting.  Elevated blood sugar/prediabetes can also affect the reading, see information below on prediabetes.  Recheck prediabetes test in the next 3 to 6 months at the most.  I will check the gout test, but if any return of gout flare, make sure to be seen as we may need to consider a daily medication.  Thank you for coming in today and take care.    Prediabetes Prediabetes is the condition of having a blood sugar (blood glucose) level that is higher than it should be, but not high enough for you to be diagnosed with type 2 diabetes. Having prediabetes puts you at risk for developing type 2 diabetes (type 2 diabetes mellitus).  Prediabetes may be called impaired glucose tolerance or impaired fasting glucose. Prediabetes usually does not cause symptoms. Your health care provider can diagnose this condition with blood tests. You may be tested for prediabetes if you are overweight and if you have at least one other risk factor for prediabetes. What is blood glucose, and how is it measured? Blood glucose refers to the amount of glucose in your bloodstream. Glucose comes from eating foods that contain sugars and starches (carbohydrates), which the body breaks down into glucose. Your blood glucose level may be measured in mg/dL (milligrams per deciliter) or mmol/L (millimoles per liter). Your blood glucose may be checked with one or more of the following blood tests:  A fasting blood glucose (FBG) test. You will not be allowed to eat (you will fast) for 8 hours or longer before a blood sample is taken. ? A normal range for FBG is 70-100 mg/dl (3.9-5.6 mmol/L).  An A1c (hemoglobin A1c) blood test. This test provides information about blood glucose control over the previous 2?1months.  An oral glucose tolerance test (OGTT). This test measures your blood glucose at two times: ? After fasting. This is your baseline level. ? Two hours after you drink a beverage that contains glucose. You may be diagnosed with prediabetes:  If your FBG is  100?125 mg/dL (5.6-6.9 mmol/L).  If your A1c level is 5.7?6.4%.  If your OGTT result is 140?199 mg/dL (7.8-11 mmol/L). These blood tests may be repeated to confirm your diagnosis. How can this condition affect me? The pancreas produces a hormone (insulin) that helps to move glucose from the bloodstream into cells. When cells in the body do not respond properly to insulin that the body makes (insulin resistance), excess glucose builds up in the blood instead of going into cells. As a result, high blood glucose (hyperglycemia) can develop, which can cause many complications. Hyperglycemia is a  symptom of prediabetes. Having high blood glucose for a long time is dangerous. Too much glucose in your blood can damage your nerves and blood vessels. Long-term damage can lead to complications from diabetes, which may include:  Heart disease.  Stroke.  Blindness.  Kidney disease.  Depression.  Poor circulation in the feet and legs, which could lead to surgical removal (amputation) in severe cases. What can increase my risk? Risk factors for prediabetes include:  Having a family member with type 2 diabetes.  Being overweight or obese.  Being older than age 15.  Being of American [place], African-American, Hispanic/Latino, or Asian/Pacific Islander descent.  Having an inactive (sedentary) lifestyle.  Having a history of heart disease.  History of gestational diabetes or polycystic ovary syndrome (PCOS), in women.  Having low levels of good cholesterol (HDL-C) or high levels of blood fats (triglycerides).  Having high blood pressure. What actions can I take to prevent diabetes?      Be physically active. ? Do moderate-intensity physical activity for 30 or more minutes on 5 or more days of the week, or as much as told by your health care provider. This could be brisk walking, biking, or water aerobics. ? Ask your health care provider what activities are safe for you. A mix of physical activities may be best, such as walking, swimming, cycling, and strength training.  Lose weight as told by your health care provider. ? Losing 5-7% of your body weight can reverse insulin resistance. ? Your health care provider can determine how much weight loss is best for you and can help you lose weight safely.  Follow a healthy meal plan. This includes eating lean proteins, complex carbohydrates, fresh fruits and vegetables, low-fat dairy products, and healthy fats. ? Follow instructions from your health care provider about eating or drinking restrictions. ? Make an appointment to see  a diet and nutrition specialist (registered dietitian) to help you create a healthy eating plan that is right for you.  Do not smoke or use any tobacco products, such as cigarettes, chewing tobacco, and e-cigarettes. If you need help quitting, ask your health care provider.  Take over-the-counter and prescription medicines as told by your health care provider. You may be prescribed medicines that help lower the risk of type 2 diabetes.  Keep all follow-up visits as told by your health care provider. This is important. Summary  Prediabetes is the condition of having a blood sugar (blood glucose) level that is higher than it should be, but not high enough for you to be diagnosed with type 2 diabetes.  Having prediabetes puts you at risk for developing type 2 diabetes (type 2 diabetes mellitus).  To help prevent type 2 diabetes, make lifestyle changes such as being physically active and eating a healthy diet. Lose weight as told by your health care provider. This information is not intended to replace advice given to you by your  health care provider. Make sure you discuss any questions you have with your health care provider. Document Revised: 07/06/2018 Document Reviewed: 05/05/2015 Elsevier Patient Education  [employer].     If you have lab work done today you will be contacted with your lab results within the next 2 weeks.  If you have not heard from [place] then please contact us. The fastest way to get your results is to register for My Chart.   IF you received an x-ray today, you will receive an invoice from [facility]. Please contact [facility] at [phone] with questions or concerns regarding your invoice.   IF you received labwork today, you will receive an invoice from [facility]. Please contact [facility] at [phone] with questions or concerns regarding your invoice.   Our billing staff will not be able to assist you with questions regarding bills from  these companies.  You will be contacted with the lab results as soon as they are available. The fastest way to get your results is to activate your My Chart account. Instructions are located on the last page of this paperwork. If you have not heard from [place] regarding the results in 2 weeks, please contact this office.         Signed, [name], MD [facility]

## 2020-02-28 LAB — LIPID PANEL
Chol/HDL Ratio: 5.9 ratio — ABNORMAL HIGH (ref 0.0–5.0)
Cholesterol, Total: 213 mg/dL — ABNORMAL HIGH (ref 100–199)
HDL: 36 mg/dL — ABNORMAL LOW (ref >39)
LDL Chol Calc (NIH): 146 mg/dL — ABNORMAL HIGH (ref 0–99)
Triglycerides: 172 mg/dL — ABNORMAL HIGH (ref 0–149)
VLDL Cholesterol Cal: 31 mg/dL (ref 5–40)

## 2020-02-28 LAB — URIC ACID: Uric Acid: 7.3 mg/dL (ref 3.8–8.4)

## 2020-03-17 ENCOUNTER — Telehealth: Payer: Self-pay | Admitting: Family Medicine

## 2020-03-17 NOTE — Telephone Encounter (Signed)
Call to patient to discuss medication request and possible gout symptoms.  Per visit 02/27/20: Gout of right foot, unspecified cause, unspecified chronicity - Plan: Uric Acid             -No flares since last visit, as no recent flare will check uric acid level, consider daily med if recurrent flares or elevated uric acid.

## 2020-03-17 NOTE — Telephone Encounter (Signed)
Pt requesting gout med?

## 2020-03-17 NOTE — Telephone Encounter (Signed)
Left message for patient to call office on voice mail

## 2020-03-17 NOTE — Telephone Encounter (Signed)
Medication Refill - Medication: predniSONE (DELTASONE) 20 MG tablet and colchicine   Has the patient contacted their pharmacy?yes (Agent: If no, request that the patient contact the pharmacy for the refill.) (Agent: If yes, when and what did the pharmacy advise?)Contact PCP  Preferred Pharmacy (with phone number or street name):  [id number] - [place], [place] - [address] Phone:  [phone]  Fax:  [phone]       Agent: Please be advised that RX refills may take up to 3 business days. We ask that you follow-up with your pharmacy.

## 2020-03-17 NOTE — Telephone Encounter (Signed)
Patient is calling back and is wanting something called in for his Gout       Patient is not sure of the name of medicine      Sent to [id number] [place], [place] - [address]  [address] [place] [address]  Phone:  [phone] Fax:  [phone]

## 2020-03-19 ENCOUNTER — Other Ambulatory Visit: Payer: Self-pay | Admitting: Registered Nurse

## 2020-03-19 DIAGNOSIS — M109 Gout, unspecified: Secondary | ICD-10-CM

## 2020-03-19 MED ORDER — PREDNISONE 20 MG PO TABS: 40.0000 mg | ORAL_TABLET | Freq: Every day | ORAL | 0 refills | Status: DC

## 2020-03-19 NOTE — Telephone Encounter (Signed)
R Morrow Filled this this morning

## 2020-03-19 NOTE — Telephone Encounter (Signed)
Pt called again today and is in severe pain and would like a refill for predniSONE (DELTASONE) 20 MG tablet  And was also advised by [name] (his Girlfriend) that he could ask for COLCHICINE to help before it gets severe or when its coming on/ please advise asap today / pt stated he is expected a call today  Please send to  [id number] - [place], [place] - [address] Phone:  [phone]  Fax:  [phone]

## 2020-04-06 DIAGNOSIS — H524 Presbyopia: Secondary | ICD-10-CM | POA: Diagnosis not present

## 2020-04-06 DIAGNOSIS — H52209 Unspecified astigmatism, unspecified eye: Secondary | ICD-10-CM | POA: Diagnosis not present

## 2020-04-06 DIAGNOSIS — H5203 Hypermetropia, bilateral: Secondary | ICD-10-CM | POA: Diagnosis not present

## 2020-05-11 ENCOUNTER — Other Ambulatory Visit: Payer: Self-pay | Admitting: Family Medicine

## 2020-05-11 NOTE — Telephone Encounter (Signed)
Requested medication (s) are due for refill today - no  Requested medication (s) are on the active medication list -no  Future visit scheduled -yes  Last refill: 03/30/20  Notes to clinic: Request RF of medication discontinued and no long current of list- sent for PCP review of request  Requested Prescriptions  Pending Prescriptions Disp Refills   tamsulosin (FLOMAX) 0.4 MG CAPS capsule [Pharmacy Med Name: TAMSULOSIN HYDROCHLORIDE 0.4 MG Capsule] 60 capsule     Sig: TAKE [place]      Urology: Alpha-Adrenergic Blocker Passed - 05/11/2020  2:29 PM      Passed - Last BP in normal range    BP Readings from Last 1 Encounters:  02/27/20 110/60          Passed - Valid encounter within last 12 months    Recent Outpatient Visits           2 months ago Hypertriglyceridemia   [facility] [place], MD   4 months ago Hyperlipidemia, unspecified hyperlipidemia type   [facility] [place], Ranell Patrick, MD   9 months ago Hyperlipidemia, unspecified hyperlipidemia type   [facility] [place], Ranell Patrick, MD   9 months ago Medicare annual wellness visit, subsequent   [facility] [place], Ranell Patrick, MD   1 year ago Bilateral hand numbness   [facility] [place], Ranell Patrick, MD       Future Appointments             In 3 months Carlota Raspberry Ranell Patrick, MD [facility] [place], [facility]                 Requested Prescriptions  Pending Prescriptions Disp Refills   tamsulosin (FLOMAX) 0.4 MG CAPS capsule [Pharmacy Med Name: TAMSULOSIN HYDROCHLORIDE 0.4 MG Capsule] 60 capsule     Sig: TAKE [place]      Urology: Alpha-Adrenergic Blocker Passed - 05/11/2020  2:29 PM      Passed - Last BP in normal range    BP Readings from Last 1 Encounters:  02/27/20 110/60          Passed - Valid encounter within last 12 months    Recent Outpatient Visits           2 months ago Hypertriglyceridemia   [facility] [place], MD   4 months ago Hyperlipidemia, unspecified hyperlipidemia type   [facility] [place], MD   9 months ago Hyperlipidemia, unspecified hyperlipidemia type   [facility] [place], Ranell Patrick, MD   9 months ago Medicare annual wellness visit, subsequent   [facility] [place], Ranell Patrick, MD   1 year ago Bilateral hand numbness   [facility] [place], Ranell Patrick, MD       Future Appointments             In 3 months Carlota Raspberry Ranell Patrick, MD [facility] [place], [facility]

## 2020-05-14 ENCOUNTER — Other Ambulatory Visit: Payer: Self-pay

## 2020-05-14 ENCOUNTER — Encounter: Payer: Self-pay | Admitting: Family Medicine

## 2020-05-14 ENCOUNTER — Telehealth (INDEPENDENT_AMBULATORY_CARE_PROVIDER_SITE_OTHER): Payer: Medicare HMO | Admitting: Family Medicine

## 2020-05-14 VITALS — Ht 70.0 in | Wt 196.0 lb

## 2020-05-14 DIAGNOSIS — R0602 Shortness of breath: Secondary | ICD-10-CM | POA: Diagnosis not present

## 2020-05-14 DIAGNOSIS — R059 Cough, unspecified: Secondary | ICD-10-CM

## 2020-05-14 DIAGNOSIS — R0609 Other forms of dyspnea: Secondary | ICD-10-CM

## 2020-05-14 DIAGNOSIS — R0600 Dyspnea, unspecified: Secondary | ICD-10-CM | POA: Diagnosis not present

## 2020-05-14 NOTE — Progress Notes (Signed)
Virtual Visit via Telephone Note  I connected with [name] [name] on 05/14/20 at 5:52 PM by telephone and verified that I am speaking with the correct person using two identifiers. Patient location:home My location: office.    I discussed the limitations, risks, security and privacy concerns of performing an evaluation and management service by telephone and the availability of in person appointments. I also discussed with the patient that there may be a patient responsible charge related to this service. The patient expressed understanding and agreed to proceed, consent obtained  Chief complaint:  Chief Complaint  Patient presents with  . Shortness of Breath    Pt reports for the past 3 weeks he has had SOB, cough, Fatigue, and congestion. Pt is fully vaccinated and boosted.      History of Present Illness: [name] [name] is a 75 y.o. male  Cough, congestion, dyspnea: Past 3 weeks. Worse 1st week, better last week No chest pain. Has had some DOE with minimal exertion - carrying wood, or walking an incline. No new leg swelling.  Coughing up white phlegm. No fever, HA, bodyache.  No recent sick contacts.  No recent change in weight.  No PND, no orthopnea.  Hx of nonischemic cardiomyopathy - last EF 45 to 50% in July 2019.  Had drive up covid test today.  Had covid vaccine and booster.    Patient Active Problem List   Diagnosis Date Noted  . Claudication ([place]) 09/01/2019  . S/P cervical spinal fusion 04/20/2018  . Paresthesia 01/10/2018  . Neck pain 01/10/2018  . LBBB (left bundle branch block) 09/07/2017  . Other meniscus derangements, posterior horn of medial meniscus, left knee, insufficency fracture medial tibial plateau 04/07/2017  . Closed nondisplaced fracture of lateral condyle of left tibia with routine healing 04/07/2017  . Status post arthroscopy of left knee 04/07/2017  . Insomnia 09/14/2015  . Hyperlipidemia with target LDL less than 100  09/11/2014  . Rotator cuff tendinitis 04/28/2014  . Obesity (BMI 30-39.9) 12/29/2013  . 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  . Exertional shortness of breath 06/26/2012  . Polyp of colon, adenomatous 04/27/2012  . Elevated PSA 10/20/2011  . Hypertriglyceridemia 07/08/2011  . Nonischemic cardiomyopathy ([place]) 09/04/2009   Past Medical History:  Diagnosis Date  . Arthritis   . BPH (benign prostatic hypertrophy)   . Coronary atherosclerosis of native coronary vessel    CATH 2004--  MINIMAL NONOBSTRUCTIVE LAD/  NORMAL EF/    CARDIAC CT  03/2008  NO SIGNIFICANT DISEASE  AND  nlef  . Elevated PSA   . Eye problem 03/06/2016   [name] retinal detachment left eye, no sx done  . Headache   . History of gout    pt 05-20-2013 states stable   . History of melanoma excision    2011-- LEFT UPPER BACK  . Hyperlipemia   . Hypertriglyceridemia   . Left bundle branch block    08/2017  . Melanoma ([place]) 2011   back  . Nonischemic cardiomyopathy ([place])    09-04-2009  --  2D echo-EF 40-45%, Global Hypokinesis - 09/2017 -> EF 45-50%   . Numbness    hands  . Personal history of arthritis   . Personal history of other diseases of circulatory system    Past Surgical History:  Procedure Laterality Date  . ANTERIOR CERVICAL DECOMP/DISCECTOMY FUSION N/A 04/20/2018   Procedure: Anterior Cervical Decompression Fusion - Cervical Three-Cervical Four - Cervical Four-Cervical Five - Cervical Five-Cervical Six;  Surgeon: Ronnald Ramp,  Camelia Eng, MD;  Location: [facility];  Service: Neurosurgery;  Laterality: N/A;  Anterior Cervical Decompression Fusion - Cervical Three-Cervical Four - Cervical Four-Cervical Five - Cervical Five-Cervical Six  . APPENDECTOMY  AS CHILD  . BACK SURGERY    . CARDIAC CATHETERIZATION  05/23/2002  &  05-23-1998   DR AL LITTLE   minimal irregularities in the LAD/  EF 50%  . CARDIOVASCULAR STRESS TEST  10-05-1998   MILD GLOBAL HYPOKINESIS AND ISCHEMIAIN ANTEROSEPTAL  AT [facility]/ EF 42%  .  CARPAL TUNNEL RELEASE Right   . EXCISION MELANOMA LEFT UPPER BACK  10-14-2009  . eye lid surgery Bilateral 2018  . KNEE ARTHROSCOPY WITH SUBCHONDROPLASTY Left 04/07/2017   Procedure: LEFT KNEE ARTHROSCOPY WITH PARTIAL MEDIAL MENISCECTOMY AND MEDIAL TIBIAL SUBCHONDROPLASTY;  Surgeon: Mcarthur Rossetti, MD;  Location: [facility];  Service: Orthopedics;  Laterality: Left;  . knee injections Bilateral   . LUMBAR DISC SURGERY  09-12-2000   left  L5 -- S1  . PROSTATE BIOPSY N/A 05/22/2013   Procedure: BIOPSY TRANSRECTAL ULTRASONIC PROSTATE (TUBP);  Surgeon: [name], MD;  Location: [facility];  Service: Urology;  Laterality: N/A;  . ROTATOR CUFF REPAIR Right 2016  . THROAT SURGERY  04/20/2018  . TRANSRECTAL ULTRASOUND PROSTATE BX  05-23-2005  &  04-19-2001  . TRANSTHORACIC ECHOCARDIOGRAM  09/2017   EF 45-50 % (previously reported as 40 to 45%).  Incoordinate septal motion with mild LVH.  GR 1 DD.  Aortic sclerosis but no stenosis.   Allergies  Allergen Reactions  . Nitroglycerin Anaphylaxis    "Cardiologist suggested he shouldn't take this med because it could kill him with his heart condition. Lowers BP"  . Crestor [Rosuvastatin] Other (See Comments)    Body aches  . Ace Inhibitors Swelling  . Lovastatin Nausea Only  . Solarcaine [Benzocaine] Dermatitis   Prior to Admission medications   Medication Sig Start Date End Date Taking? Authorizing Provider  aspirin EC 81 MG tablet Take 1 tablet (81 mg total) by mouth daily. 04/26/18  Yes Costella, Vista Mink, PA-C  gabapentin (NEURONTIN) 300 MG capsule TAKE 1 TO 2 CAPSULES BY  MOUTH 3 TIMES DAILY AS  NEEDED FOR PAIN. 02/06/20  Yes [name], MD  Garlic 4580 MG CAPS Take 1,000 mg by mouth daily.   Yes [provider]  magnesium oxide (MAG-OX) 400 MG tablet Take 400 mg by mouth daily.   Yes [provider]  Multiple Vitamin (MULTIVITAMIN WITH MINERALS) TABS tablet Take 1 tablet by mouth daily.  One-A-Day Multivitamin   Yes [provider]  Omega-3 Fatty Acids (FISH OIL) 500 MG CAPS Take 500 mg by mouth daily.   Yes [provider]  predniSONE (DELTASONE) 20 MG tablet Take 2 tablets (40 mg total) by mouth daily with breakfast. 03/19/20  Yes Maximiano Coss, NP  sildenafil (VIAGRA) 100 MG tablet 1/2 to 1 tablet up to each day as needed. Patient taking differently: Take 50-100 mg by mouth daily as needed for erectile dysfunction. 07/08/11  Yes [name], MD  Turmeric 500 MG TABS Take 500 mg by mouth daily.   Yes [provider]  vitamin B-12 (CYANOCOBALAMIN) 500 MCG tablet Take 500 mcg by mouth daily.   Yes [provider]  Zinc 50 MG TABS Take 50 mg by mouth daily.   Yes [provider]  rosuvastatin (CRESTOR) 10 MG tablet Take 1 tablet (10 mg total) by mouth every evening. 08/15/19 01/02/20  [name]  R, MD   Social History   Socioeconomic History  . Marital status: Widowed    Spouse name: Not on file  . Number of children: 2  . Years of education: 35  . Highest education level: High school graduate  Occupational History  . 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  Tobacco Use  . Smoking status: Former Smoker    Packs/day: 2.00    Years: 20.00    Pack years: 40.00    Types: Cigarettes    Quit date: 03/28/1980    Years since quitting: 40.1  . Smokeless tobacco: Never Used  Vaping Use  . Vaping Use: Never used  Substance and Sexual Activity  . Alcohol use: No    Alcohol/week: 0.0 standard drinks  . Drug use: No  . Sexual activity: Not on file  Other Topics Concern  . Not on file  Social History Narrative   He walks on a relatively regular basis about 15 minutes at a time mostly to the discomfort. He previously had walked up a 30-40 minutes a time without problems. Education: [employer].   Lives alone.   Right-handed.   One cup coffee daily.   Social Determinants of Health    Financial Resource Strain: Not on file  Food Insecurity: Not on file  Transportation Needs: Not on file  Physical Activity: Not on file  Stress: Not on file  Social Connections: Not on file  Intimate Partner Violence: Not on file     Observations/Objective: Vitals:   05/14/20 1306  Weight: 196 lb (88.9 kg)  Height: 5\' 10"  (1.778 m)  Speaking full sentences, no apparent respiratory distress.  Cough few times during visit.  No other distress.  All questions were answered intelligible responses.  Understanding of plan expressed.   Assessment and Plan: SOB (shortness of breath) - Plan: COVID-19, Flu A+B and RSV, DG Chest 2 View, CANCELED: DG Chest 2 View  DOE (dyspnea on exertion) - Plan: DG Chest 2 View, CANCELED: DG Chest 2 View  Cough - Plan: DG Chest 2 View, CANCELED: DG Chest 2 View 3 week history of dyspnea on exertion, initially worse, some improvement then plateaued since past week or so.  Denies chest pain or tightness, denies orthopnea or PND but does have history of nonischemic cardiomyopathy.  COVID testing performed today as drive up test.  Will check chest x-ray tomorrow but also recommended he contact his cardiologist tomorrow morning to determine if they would like to see him sooner or other recommended testing.  ER/urgent care precautions given.  Follow Up Instructions: Depending on chest x-ray and advised to call cardiologist.  ER/urgent care precautions   I discussed the assessment and treatment plan with the patient. The patient was provided an opportunity to ask questions and all were answered. The patient agreed with the plan and demonstrated an understanding of the instructions.   The patient was advised to call back or seek an in-person evaluation if the symptoms worsen or if the condition fails to improve as anticipated.  I provided 13 minutes of non-face-to-face time during this encounter.  Signed,   [name], MD [facility].  05/14/20

## 2020-05-15 ENCOUNTER — Ambulatory Visit
Admission: RE | Admit: 2020-05-15 | Discharge: 2020-05-15 | Disposition: A | Discharge status: Home / Self Care | Payer: Commercial Managed Care - HMO | Source: Ambulatory Visit | Attending: Family Medicine | Admitting: Family Medicine

## 2020-05-15 ENCOUNTER — Other Ambulatory Visit: Payer: Self-pay

## 2020-05-15 ENCOUNTER — Telehealth: Payer: Self-pay | Admitting: Cardiology

## 2020-05-15 DIAGNOSIS — R0602 Shortness of breath: Secondary | ICD-10-CM

## 2020-05-15 DIAGNOSIS — J90 Pleural effusion, not elsewhere classified: Secondary | ICD-10-CM | POA: Diagnosis not present

## 2020-05-15 DIAGNOSIS — R059 Cough, unspecified: Secondary | ICD-10-CM | POA: Diagnosis not present

## 2020-05-15 DIAGNOSIS — R0600 Dyspnea, unspecified: Secondary | ICD-10-CM

## 2020-05-15 DIAGNOSIS — R0609 Other forms of dyspnea: Secondary | ICD-10-CM

## 2020-05-15 NOTE — Telephone Encounter (Signed)
Pt c/o Shortness Of Breath: STAT if SOB developed within the last 24 hours or pt is noticeably SOB on the phone  1. Are you currently SOB (can you hear that pt is SOB on the phone)? No   2. How long have you been experiencing SOB? Past 3 weeks, per patient  3. Are you SOB when sitting or when up moving around? When up and moving around  4. Are you currently experiencing any other symptoms? States he has been coughing a lot

## 2020-05-15 NOTE — Telephone Encounter (Signed)
Returned call to patient of Dr. Ellyn Hack who reports SOB for about 3 weeks. This occurs when moving around. He is better today per his report - he is not audibly short of breath on the phone. He had a video visit with PCP yesterday who suggested chest x-ray. I advised he should go ahead and get this done, as advised by PCP, to r/o respiratory issues. Scheduled him visit with Dr. Ellyn Hack on 2/23 @ 3:40pm

## 2020-05-16 ENCOUNTER — Telehealth: Payer: Self-pay | Admitting: Family Medicine

## 2020-05-16 DIAGNOSIS — R0609 Other forms of dyspnea: Secondary | ICD-10-CM

## 2020-05-16 DIAGNOSIS — R0600 Dyspnea, unspecified: Secondary | ICD-10-CM

## 2020-05-16 LAB — COVID-19, FLU A+B AND RSV
Influenza A, NAA: NOT DETECTED
Influenza B, NAA: NOT DETECTED
RSV, NAA: NOT DETECTED
SARS-CoV-2, NAA: NOT DETECTED

## 2020-05-16 NOTE — Telephone Encounter (Signed)
CXR noted. Trace pleural effusions noted.  Feels same.  Walked down hill today more short of breath than usual - had been better.past few days worse after initial improvement few weeks ago. Spoke with [name] at his request. She noted intial worst symptoms few weeks ago, better for few weeks, then worse past few days. No distress at rest or walking at home. Recommended ER or urgent care eval in next 24 hours. Will order BNP for lab only visit next week, but needs eval sooner. Overnight ER precautions discussed.

## 2020-05-17 ENCOUNTER — Ambulatory Visit (HOSPITAL_COMMUNITY)
Admission: EM | Admit: 2020-05-17 | Discharge: 2020-05-17 | Disposition: A | Discharge status: Home / Self Care | Payer: Medicare HMO | Attending: Urgent Care | Admitting: Urgent Care

## 2020-05-17 ENCOUNTER — Telehealth (HOSPITAL_COMMUNITY): Payer: Self-pay | Admitting: Urgent Care

## 2020-05-17 ENCOUNTER — Other Ambulatory Visit: Payer: Self-pay

## 2020-05-17 ENCOUNTER — Encounter (HOSPITAL_COMMUNITY): Payer: Self-pay

## 2020-05-17 DIAGNOSIS — R9430 Abnormal result of cardiovascular function study, unspecified: Secondary | ICD-10-CM | POA: Insufficient documentation

## 2020-05-17 DIAGNOSIS — I428 Other cardiomyopathies: Secondary | ICD-10-CM

## 2020-05-17 DIAGNOSIS — I2510 Atherosclerotic heart disease of native coronary artery without angina pectoris: Secondary | ICD-10-CM

## 2020-05-17 DIAGNOSIS — R0602 Shortness of breath: Secondary | ICD-10-CM

## 2020-05-17 DIAGNOSIS — J90 Pleural effusion, not elsewhere classified: Secondary | ICD-10-CM

## 2020-05-17 DIAGNOSIS — IMO0002 Reserved for concepts with insufficient information to code with codable children: Secondary | ICD-10-CM

## 2020-05-17 LAB — BRAIN NATRIURETIC PEPTIDE: B Natriuretic Peptide: 359.2 pg/mL — ABNORMAL HIGH (ref 0.0–100.0)

## 2020-05-17 MED ORDER — ASPIRIN 81 MG PO CHEW: 324.0000 mg | CHEWABLE_TABLET | Freq: Once | ORAL | Status: DC

## 2020-05-17 MED ORDER — HYDROCODONE-ACETAMINOPHEN 5-325 MG PO TABS: 1.0000 | ORAL_TABLET | Freq: Once | ORAL | Status: DC

## 2020-05-17 MED ORDER — FUROSEMIDE 40 MG PO TABS: 40.0000 mg | ORAL_TABLET | Freq: Every day | ORAL | 0 refills | Status: DC

## 2020-05-17 NOTE — ED Triage Notes (Signed)
Pt presents with sob for past 3 weeks, had chest x ray on 2/18 showed bilateral pleural effusion, was told to come here by pcp

## 2020-05-17 NOTE — ED Provider Notes (Signed)
[facility]   MRN: [id number] DOB: [birth year]  Subjective:   [name] [name] is a 75 y.o. male with past medical history of CAD, nonischemic cardiomyopathy, hyperlipidemia, left bundle branch block presenting for checkup for persistent 3-week history of dyspnea on exertion.  Patient was last seen by his primary care provider 2 days ago, had a chest x-ray done that showed trace bilateral pleural effusions.  He has a cardiology appointment in 3 days.  Discussed this with his regular doctor and was advised to come to our clinic to be evaluated.  Denies any worsening of his symptoms over the past 3 weeks.  Denies formal diagnosis of heart failure.  No history of heart attack.  Symptoms are exertional only.  Does not have any shortness of breath at rest.  No chest pain.  No current facility-administered medications for this encounter.  Current Outpatient Medications:  .  aspirin EC 81 MG tablet, Take 1 tablet (81 mg total) by mouth daily., Disp: , Rfl:  .  gabapentin (NEURONTIN) 300 MG capsule, TAKE 1 TO 2 CAPSULES BY  MOUTH 3 TIMES DAILY AS  NEEDED FOR PAIN., Disp: 540 capsule, Rfl: 0 .  Garlic 8299 MG CAPS, Take 1,000 mg by mouth daily., Disp: , Rfl:  .  magnesium oxide (MAG-OX) 400 MG tablet, Take 400 mg by mouth daily., Disp: , Rfl:  .  Multiple Vitamin (MULTIVITAMIN WITH MINERALS) TABS tablet, Take 1 tablet by mouth daily. One-A-Day Multivitamin, Disp: , Rfl:  .  Omega-3 Fatty Acids (FISH OIL) 500 MG CAPS, Take 500 mg by mouth daily., Disp: , Rfl:  .  sildenafil (VIAGRA) 100 MG tablet, 1/2 to 1 tablet up to each day as needed. (Patient taking differently: Take 50-100 mg by mouth daily as needed for erectile dysfunction.), Disp: 10 tablet, Rfl: 5 .  Turmeric 500 MG TABS, Take 500 mg by mouth daily., Disp: , Rfl:  .  vitamin B-12 (CYANOCOBALAMIN) 500 MCG tablet, Take 500 mcg by mouth daily., Disp: , Rfl:  .  Zinc 50 MG TABS, Take 50 mg by mouth daily., Disp: , Rfl:     Allergies  Allergen Reactions  . Nitroglycerin Anaphylaxis    "Cardiologist suggested he shouldn't take this med because it could kill him with his heart condition. Lowers BP"  . Crestor [Rosuvastatin] Other (See Comments)    Body aches  . Ace Inhibitors Swelling  . Lovastatin Nausea Only  . Solarcaine [Benzocaine] Dermatitis    Past Medical History:  Diagnosis Date  . Arthritis   . BPH (benign prostatic hypertrophy)   . Coronary atherosclerosis of native coronary vessel    CATH 2004--  MINIMAL NONOBSTRUCTIVE LAD/  NORMAL EF/    CARDIAC CT  03/2008  NO SIGNIFICANT DISEASE  AND  nlef  . Elevated PSA   . Eye problem 03/06/2016   [name] retinal detachment left eye, no sx done  . Headache   . History of gout    pt 05-20-2013 states stable   . History of melanoma excision    2011-- LEFT UPPER BACK  . Hyperlipemia   . Hypertriglyceridemia   . Left bundle branch block    08/2017  . Melanoma ([place]) 2011   back  . Nonischemic cardiomyopathy ([place])    09-04-2009  --  2D echo-EF 40-45%, Global Hypokinesis - 09/2017 -> EF 45-50%   . Numbness    hands  . Personal history of arthritis   . Personal history of other diseases  of circulatory system      Past Surgical History:  Procedure Laterality Date  . ANTERIOR CERVICAL DECOMP/DISCECTOMY FUSION N/A 04/20/2018   Procedure: Anterior Cervical Decompression Fusion - Cervical Three-Cervical Four - Cervical Four-Cervical Five - Cervical Five-Cervical Six;  Surgeon: Eustace Moore, MD;  Location: [facility];  Service: Neurosurgery;  Laterality: N/A;  Anterior Cervical Decompression Fusion - Cervical Three-Cervical Four - Cervical Four-Cervical Five - Cervical Five-Cervical Six  . APPENDECTOMY  AS CHILD  . BACK SURGERY    . CARDIAC CATHETERIZATION  05/23/2002  &  05-23-1998   DR AL LITTLE   minimal irregularities in the LAD/  EF 50%  . CARDIOVASCULAR STRESS TEST  10-05-1998   MILD GLOBAL HYPOKINESIS AND ISCHEMIAIN ANTEROSEPTAL  AT [facility]/ EF 42%   . CARPAL TUNNEL RELEASE Right   . EXCISION MELANOMA LEFT UPPER BACK  10-14-2009  . eye lid surgery Bilateral 2018  . KNEE ARTHROSCOPY WITH SUBCHONDROPLASTY Left 04/07/2017   Procedure: LEFT KNEE ARTHROSCOPY WITH PARTIAL MEDIAL MENISCECTOMY AND MEDIAL TIBIAL SUBCHONDROPLASTY;  Surgeon: Mcarthur Rossetti, MD;  Location: [facility];  Service: Orthopedics;  Laterality: Left;  . knee injections Bilateral   . LUMBAR DISC SURGERY  09-12-2000   left  L5 -- S1  . PROSTATE BIOPSY N/A 05/22/2013   Procedure: BIOPSY TRANSRECTAL ULTRASONIC PROSTATE (TUBP);  Surgeon: [name], MD;  Location: [facility];  Service: Urology;  Laterality: N/A;  . ROTATOR CUFF REPAIR Right 2016  . THROAT SURGERY  04/20/2018  . TRANSRECTAL ULTRASOUND PROSTATE BX  05-23-2005  &  04-19-2001  . TRANSTHORACIC ECHOCARDIOGRAM  09/2017   EF 45-50 % (previously reported as 40 to 45%).  Incoordinate septal motion with mild LVH.  GR 1 DD.  Aortic sclerosis but no stenosis.    Family History  Problem Relation Age of Onset  . Arthritis Mother   . COPD Father   . Cancer Father        lung cancer  . Cancer Sister   . Cardiomyopathy Other        idiopathic. trivial disease 2004 cath, 2010 cardiac CT - no sig. dz, nl LV fxn. cards: Little  . Colon cancer Neg Hx     Social History   Tobacco Use  . Smoking status: Former Smoker    Packs/day: 2.00    Years: 20.00    Pack years: 40.00    Types: Cigarettes    Quit date: 03/28/1980    Years since quitting: 40.1  . Smokeless tobacco: Never Used  Vaping Use  . Vaping Use: Never used  Substance Use Topics  . Alcohol use: No    Alcohol/week: 0.0 standard drinks  . Drug use: No    ROS   Objective:   Vitals: BP 114/72 (BP Location: Right Arm)   Pulse 68   Temp 97.8 F (36.6 C) (Oral)   SpO2 94%   Pulse oximetry 95-98% on recheck by PA-Elnore Cosens.   Physical Exam Constitutional:      General: He is not in acute distress.    Appearance: Normal  appearance. He is well-developed. He is not ill-appearing, toxic-appearing or diaphoretic.  HENT:     Head: Normocephalic and atraumatic.     Right Ear: External ear normal.     Left Ear: External ear normal.     Nose: Nose normal.     Mouth/Throat:     Mouth: Mucous membranes are moist.     Pharynx: Oropharynx is clear.  Eyes:  General: No scleral icterus.    Extraocular Movements: Extraocular movements intact.     Pupils: Pupils are equal, round, and reactive to light.  Cardiovascular:     Rate and Rhythm: Normal rate and regular rhythm.     Heart sounds: Normal heart sounds. No murmur heard. No friction rub. No gallop.   Pulmonary:     Effort: Pulmonary effort is normal. No respiratory distress.     Breath sounds: Normal breath sounds. No stridor. No wheezing, rhonchi or rales.  Neurological:     Mental Status: He is alert and oriented to person, place, and time.  Psychiatric:        Mood and Affect: Mood normal.        Behavior: Behavior normal.        Thought Content: Thought content normal.    DG Chest 2 View  Result Date: 05/15/2020 CLINICAL DATA:  Cough and shortness of breath. EXAM: CHEST - 2 VIEW COMPARISON:  Chest x-ray dated April 12, 2018. FINDINGS: The heart size and mediastinal contours are within normal limits. Normal pulmonary vascularity. Trace bilateral pleural effusions. No consolidation or pneumothorax. No acute osseous abnormality. IMPRESSION: 1. Trace bilateral pleural effusions. Electronically Signed   By: Titus Dubin M.D.   On: 05/15/2020 12:19   ED ECG REPORT   Date: 05/17/2020  Rate: 80bpm  Rhythm: normal sinus rhythm and premature ventricular contractions (PVC)  QRS Axis: left  Intervals: normal  ST/T Wave abnormalities: nonspecific T wave changes  Conduction Disutrbances:left bundle branch block  Narrative Interpretation: Sinus rhythm with occasional PVC at 80 bpm.  Left axis deviation with nonspecific T wave inversion in one, aVL, V5 and  V6.  This is different from the last EKG but exactly the same as the EKG from June 2019.  Old EKG Reviewed: unchanged and changes noted  I have personally reviewed the EKG tracing and agree with the computerized printout as noted.  Assessment and Plan :   PDMP not reviewed this encounter.  1. Shortness of breath   2. Coronary artery disease involving native heart without angina pectoris, unspecified vessel or lesion type   3. Non-ischemic cardiomyopathy ([place])   4. Ejection fraction < 50%   5. Pleural effusion     Patient does not have any chest pain, belly pain, diaphoresis, symptoms consistent with ACS.  His EKG from today is abnormal but I do not see any acute findings as it is very comparable to previous EKG from June 2019.  He does have a cardiology appointment coming up in 3 days.  BNP level is pending as this is part of the work-up requested via his PCP, Dr. Carlota Raspberry.  We will follow-up with those results as they are available.  Maintain strict ER precautions.  Otherwise keep appointment with the cardiologist and follow-up with PCP.   Jaynee Eagles, [place] 05/17/20 1432

## 2020-05-17 NOTE — Telephone Encounter (Signed)
Results for orders placed or performed during the hospital encounter of 05/17/20 (from the past 24 hour(s))  Brain natriuretic peptide     Status: Abnormal   Collection Time: 05/17/20  1:56 PM  Result Value Ref Range   B Natriuretic Peptide 359.2 (H) 0.0 - 100.0 pg/mL    Called and discussed results with patient.  Offered to use furosemide for the next 3 days.  He does have follow-up with his cardiologist coming up soon.  Counseled patient that he could also talk with his cardiologist and PCP to make sure this is the route they want to take prior to starting furosemide.

## 2020-05-20 ENCOUNTER — Other Ambulatory Visit: Payer: Self-pay

## 2020-05-20 ENCOUNTER — Ambulatory Visit: Payer: Medicare HMO | Admitting: Cardiology

## 2020-05-20 ENCOUNTER — Encounter: Payer: Self-pay | Admitting: Cardiology

## 2020-05-20 VITALS — BP 130/60 | HR 83 | Ht 70.0 in | Wt 199.0 lb

## 2020-05-20 DIAGNOSIS — R0602 Shortness of breath: Secondary | ICD-10-CM | POA: Diagnosis not present

## 2020-05-20 DIAGNOSIS — I739 Peripheral vascular disease, unspecified: Secondary | ICD-10-CM

## 2020-05-20 DIAGNOSIS — I5022 Chronic systolic (congestive) heart failure: Secondary | ICD-10-CM | POA: Insufficient documentation

## 2020-05-20 DIAGNOSIS — I447 Left bundle-branch block, unspecified: Secondary | ICD-10-CM | POA: Diagnosis not present

## 2020-05-20 DIAGNOSIS — I5023 Acute on chronic systolic (congestive) heart failure: Secondary | ICD-10-CM

## 2020-05-20 DIAGNOSIS — I428 Other cardiomyopathies: Secondary | ICD-10-CM

## 2020-05-20 DIAGNOSIS — E785 Hyperlipidemia, unspecified: Secondary | ICD-10-CM

## 2020-05-20 MED ORDER — FUROSEMIDE 40 MG PO TABS: ORAL_TABLET | ORAL | 6 refills | Status: DC

## 2020-05-20 MED ORDER — ROSUVASTATIN CALCIUM 10 MG PO TABS: 10.0000 mg | ORAL_TABLET | Freq: Every day | ORAL | 3 refills | Status: DC

## 2020-05-20 NOTE — Progress Notes (Signed)
Primary Care Provider: [name], MD Cardiologist: Glenetta Hew, MD Electrophysiologist: None  Clinic Note: Chief Complaint  Patient presents with  . Shortness of Breath    PCP  vist, then UC visit - elevated BNP,CXR with ? Pulm edema - given lasix   . Hospitalization Follow-up    UC visit - CXR with increase Pulm Vascularity (PCP office had just started Lasix x 3 d)   ===================================  ASSESSMENT/PLAN   Problem List Items Addressed This Visit    Acute on chronic HFrEF (heart failure with reduced ejection fraction) (HCC) - Primary    Borderline low EF by echo.  Had not had any episodes of heart failure until recently. Now complaining of cough and dyspnea.  Not really any edema.  BNP mildly elevated.  Plan:  Refill Lasix 40 mg-take at least 2 times a weeks, and as needed for weight gain greater than 3 pounds, worsening shortness of breath or swelling.  He has not been on any blood pressure medications, monitor pressures and consider afterload reduction with ARB.  (In the past, blood pressures been too low for medications.)  Check 2D echocardiogram to reassess EF.      Relevant Medications   rosuvastatin (CRESTOR) 10 MG tablet   furosemide (LASIX) 40 MG tablet   Other Relevant Orders   EKG 12-Lead (Completed)   ECHOCARDIOGRAM COMPLETE   Exertional shortness of breath (Chronic)    He has always had a little exertional dyspnea, now his dyspnea is worsened.  Rechecking 2D echo, starting Lasix.  Follow-up in 3 months to discuss results of echo and reassess symptoms.  If pressure still elevated, would probably add an ARB.      Relevant Orders   EKG 12-Lead (Completed)   ECHOCARDIOGRAM COMPLETE   Nonischemic cardiomyopathy (HCC) (Chronic)    Previous echo had mildly reduced EF.  We will recheck an echocardiogram given new onset of symptoms.      Relevant Medications   rosuvastatin (CRESTOR) 10 MG tablet   furosemide (LASIX) 40 MG tablet    Other Relevant Orders   EKG 12-Lead (Completed)   ECHOCARDIOGRAM COMPLETE   Hyperlipidemia with target LDL less than 100 (Chronic)    LDL is well controlled on statin in the past.  He thinks he can think his symptoms got better when he started Neurontin.  It may notice much difference in his statin holiday.  We will restart rosuvastatin 10 mg daily.  Lab results from May through December reviewed.  Dramatic change being off of statin.      Relevant Medications   rosuvastatin (CRESTOR) 10 MG tablet   furosemide (LASIX) 40 MG tablet   LBBB (left bundle branch block) (Chronic)    I am not sure if the LBBB is the cause of the reduced EF or the other way around. With new onset CHF symptoms, will recheck 2D echo.      Relevant Medications   rosuvastatin (CRESTOR) 10 MG tablet   furosemide (LASIX) 40 MG tablet   Other Relevant Orders   EKG 12-Lead (Completed)   ECHOCARDIOGRAM COMPLETE     ===================================  HPI:    [name] [name] is a 75 y.o. male with a PMH notable for MILD NONISCHEMIC CARDIOMYOPATHY (EF~45%), LBBB, mild AORTIC STENOSIS who presents today for ER follow-up after presenting with dyspnea, cough.  [name] [name] was last seen on on August 29, 2019.  He is doing very well.  Noted little bit of leg/thighs and calves hurting with  walking.  Documented peripheral neuropathy.  Not as much energy is used to have, but not anything new.  No PND, orthopnea or edema.  No chest tightness or pressure.  Mild pedal edema. -> LE Dopplers ordered.  Statin holiday   He was seen via telemedicine by Dr. Carlota Raspberry on February 17 with complaints of 3 weeks worsening dyspnea, cough and fatigue.  Noted more exertional dyspnea carrying wood walking an incline.  Mildly productive cough.  Double vaccinated and boosted for Covid. => CXR ordered along with BNP  Recent Hospitalizations:   [place] visit 05/17/2020: Check up for 3-week history of dyspnea.  PCP  recommended he come to be evaluated.  No chest pain or resting dyspnea.  No palpitations.  Chest x-ray showed trace bilateral pleural effusions with normal pulmonary vascularity.;  EKG showed sinus rhythm with PVCs.  PCPs office called and 3 days worth of furosemide up to this visit.  Reviewed  CV studies:    The following studies were reviewed today: (if available, images/films reviewed: From Epic Chart or Care Everywhere) . LAA Dopplers 09/02/2019: Bilateral ABIs normal.  (R ABI 1.19, L ABI 1.24) normal TBI's (R TBI 0.83, L TBI 1.03).   Interval History:   [name] [name] presents here today stating that he feels a lot better after he took to 3 days with the Lasix.  Breathing is improved.  Not noticing any swelling.  He still has exertional dyspnea, but no chest pain and the dyspnea is notably improved.  He does not note skipped beats or dyspnea.  His leg still Little bit, but this has not really changed to being off of the statin.  Things actually improved when he started on Neurontin.  Despite having the symptoms, he still walks every day and indicates that the dyspnea has improved having taken the Lasix.  CV Review of Symptoms (Summary): positive for - dyspnea on exertion, edema, orthopnea, paroxysmal nocturnal dyspnea and Also mild nonproductive cough.  All the symptoms have improved since starting Lasix.  He still has some mild cramping symptoms that he considers to be consistent with neuropathy -> no change being off statin, better with Neurontin. negative for - irregular heartbeat, palpitations, rapid heart rate, shortness of breath or (Shortness of breath at rest), syncope or near syncope, TIA or amaurosis fugax, claudication.  The patient does have symptoms concerning for COVID-19 infection (fever, chills, cough, or new shortness of breath).-No fever.  Negative Covid screen.  REVIEWED OF SYSTEMS   Review of Systems  Constitutional: Positive for malaise/fatigue. Negative for  weight loss.  HENT: Negative for congestion and nosebleeds.   Respiratory: Positive for cough (Better since being on Lasix) and shortness of breath (Only exertional).   Gastrointestinal: Negative for blood in stool, constipation and melena.  Genitourinary: Positive for frequency (Increased urinary output with Lasix). Negative for hematuria.  Musculoskeletal: Positive for joint pain (Knees).       Legs still ache, not as bad since starting Neurontin.  Did not necessarily note a difference being off of statin.  Neurological: Positive for tingling (Feet and legs/hands-better with Neurontin). Negative for dizziness and focal weakness.  Psychiatric/Behavioral: Negative for depression and memory loss. The patient is not nervous/anxious and does not have insomnia.    I have reviewed and (if needed) personally updated the patient's problem list, medications, allergies, past medical and surgical history, social and family history.   PAST MEDICAL HISTORY   Past Medical History:  Diagnosis Date  . Arthritis   .  BPH (benign prostatic hypertrophy)   . Coronary atherosclerosis of native coronary vessel    CATH 2004--  MINIMAL NONOBSTRUCTIVE LAD/  NORMAL EF/    CARDIAC CT  03/2008  NO SIGNIFICANT DISEASE  AND  nlef  . Elevated PSA   . Eye problem 03/06/2016   [name] retinal detachment left eye, no sx done  . Headache   . History of gout    pt 05-20-2013 states stable   . History of melanoma excision    2011-- LEFT UPPER BACK  . Hyperlipemia   . Hypertriglyceridemia   . Left bundle branch block    08/2017  . Melanoma ([place]) 2011   back  . Nonischemic cardiomyopathy ([place])    09-04-2009  --  2D echo-EF 40-45%, Global Hypokinesis - 09/2017 -> EF 45-50%   . Numbness    hands  . Personal history of arthritis   . Personal history of other diseases of circulatory system     PAST SURGICAL HISTORY   Past Surgical History:  Procedure Laterality Date  . ANTERIOR CERVICAL DECOMP/DISCECTOMY FUSION  N/A 04/20/2018   Procedure: Anterior Cervical Decompression Fusion - Cervical Three-Cervical Four - Cervical Four-Cervical Five - Cervical Five-Cervical Six;  Surgeon: Eustace Moore, MD;  Location: [facility];  Service: Neurosurgery;  Laterality: N/A;  Anterior Cervical Decompression Fusion - Cervical Three-Cervical Four - Cervical Four-Cervical Five - Cervical Five-Cervical Six  . APPENDECTOMY  AS CHILD  . BACK SURGERY    . CARDIAC CATHETERIZATION  05/23/2002  &  05-23-1998   minimal irregularities in the LAD/  EF 50%  -   DR AL LITTLE  . CARDIOVASCULAR STRESS TEST  10-05-1998   MILD GLOBAL HYPOKINESIS AND ISCHEMIAIN ANTEROSEPTAL  AT [facility]/ EF 42%  . CARPAL TUNNEL RELEASE Right   . EXCISION MELANOMA LEFT UPPER BACK  10-14-2009  . eye lid surgery Bilateral 2018  . KNEE ARTHROSCOPY WITH SUBCHONDROPLASTY Left 04/07/2017   Procedure: LEFT KNEE ARTHROSCOPY WITH PARTIAL MEDIAL MENISCECTOMY AND MEDIAL TIBIAL SUBCHONDROPLASTY;  Surgeon: Mcarthur Rossetti, MD;  Location: [facility];  Service: Orthopedics;  Laterality: Left;  . knee injections Bilateral   . LUMBAR DISC SURGERY  09-12-2000   left  L5 -- S1  . PROSTATE BIOPSY N/A 05/22/2013   Procedure: BIOPSY TRANSRECTAL ULTRASONIC PROSTATE (TUBP);  Surgeon: [name], MD;  Location: [facility];  Service: Urology;  Laterality: N/A;  . ROTATOR CUFF REPAIR Right 2016  . THROAT SURGERY  04/20/2018  . TRANSRECTAL ULTRASOUND PROSTATE BX  05-23-2005  &  04-19-2001  . TRANSTHORACIC ECHOCARDIOGRAM  09/2017   EF 45-50 % (previously reported as 40 to 45%).  Incoordinate septal motion with mild LVH.  GR 1 DD.  Aortic sclerosis but no stenosis.    Immunization History  Administered Date(s) Administered  . Hepatitis B 01/14/2011  . Influenza Split 01/14/2011, 01/18/2012  . Influenza, High Dose Seasonal PF 01/10/2018, 12/19/2018  . Influenza,inj,Quad PF,6+ Mos 02/04/2013, 12/26/2013, 01/23/2015, 01/22/2016, 12/16/2016  . Influenza-Unspecified  12/27/2019  . PFIZER(Purple Top)SARS-COV-2 Vaccination 04/30/2019, 05/22/2019  . Pneumococcal Conjugate-13 05/06/2014  . Pneumococcal Polysaccharide-23 03/28/2004, 04/29/2009  . Tdap 08/11/2009  . Zoster 06/11/2013    MEDICATIONS/ALLERGIES   Current Meds  Medication Sig  . aspirin EC 81 MG tablet Take 1 tablet (81 mg total) by mouth daily.  Marland Kitchen gabapentin (NEURONTIN) 300 MG capsule TAKE 1 TO 2 CAPSULES BY  MOUTH 3 TIMES DAILY AS  NEEDED FOR PAIN.  Marland Kitchen Garlic 6283 MG CAPS Take 1,000  mg by mouth daily.  . magnesium oxide (MAG-OX) 400 MG tablet Take 400 mg by mouth daily.  . Multiple Vitamin (MULTIVITAMIN WITH MINERALS) TABS tablet Take 1 tablet by mouth daily. One-A-Day Multivitamin  . Omega-3 Fatty Acids (FISH OIL) 500 MG CAPS Take 500 mg by mouth daily.  . rosuvastatin (CRESTOR) 10 MG tablet Take 1 tablet (10 mg total) by mouth daily.  . sildenafil (VIAGRA) 100 MG tablet 1/2 to 1 tablet up to each day as needed. (Patient taking differently: Take 50-100 mg by mouth daily as needed for erectile dysfunction.)  . Turmeric 500 MG TABS Take 500 mg by mouth daily.  . vitamin B-12 (CYANOCOBALAMIN) 500 MCG tablet Take 500 mcg by mouth daily.  . Zinc 50 MG TABS Take 50 mg by mouth daily.  . [DISCONTINUED] furosemide (LASIX) 40 MG tablet Take 1 tablet (40 mg total) by mouth daily.    Allergies  Allergen Reactions  . Nitroglycerin Anaphylaxis    "Cardiologist suggested he shouldn't take this med because it could kill him with his heart condition. Lowers BP"  . Crestor [Rosuvastatin] Other (See Comments)    Body aches  . Ace Inhibitors Swelling  . Lovastatin Nausea Only  . Solarcaine [Benzocaine] Dermatitis    SOCIAL HISTORY/FAMILY HISTORY   Reviewed in Epic:  Pertinent findings:  Social History   Tobacco Use  . Smoking status: Former Smoker    Packs/day: 2.00    Years: 20.00    Pack years: 40.00    Types: Cigarettes    Quit date: 03/28/1980    Years since quitting: 40.1  . Smokeless  tobacco: Never Used  Vaping Use  . Vaping Use: Never used  Substance Use Topics  . Alcohol use: No    Alcohol/week: 0.0 standard drinks  . Drug use: No   Social History   Social History Narrative   He walks on a relatively regular basis about 15 minutes at a time mostly to the discomfort. He previously had walked up a 30-40 minutes a time without problems. Education: [employer].   Lives alone.   Right-handed.   One cup coffee daily.    OBJCTIVE -PE, EKG, labs   Wt Readings from Last 3 Encounters:  05/21/20 201 lb (91.2 kg)  05/20/20 199 lb (90.3 kg)  05/14/20 196 lb (88.9 kg)  08/29/2019-199 lb  Physical Exam: BP 130/60   Pulse 83   Ht 5\' 10"  (1.778 m)   Wt 199 lb (90.3 kg)   SpO2 96%   BMI 28.55 kg/m  Physical Exam Constitutional:      General: He is not in acute distress.    Appearance: He is well-developed and normal weight. He is not ill-appearing or toxic-appearing.     Comments: Well-groomed.  Healthy-appearing.  HENT:     Head: Normocephalic and atraumatic.  Cardiovascular:     Rate and Rhythm: Normal rate and regular rhythm. Occasional extrasystoles are present.    Chest Wall: PMI is not displaced.     Pulses: Normal pulses.     Heart sounds: S1 normal. Heart sounds are distant. No murmur heard. No friction rub. No gallop.      Comments: ~S2 split Pulmonary:     Effort: Pulmonary effort is normal. No respiratory distress.     Breath sounds: Examination of the right-lower field reveals decreased breath sounds. Examination of the left-lower field reveals decreased breath sounds. Decreased breath sounds present. No wheezing, rhonchi or rales (Mild bibasilar crackles).  Musculoskeletal:  General: Swelling (Trivial) present. Normal range of motion.  Skin:    General: Skin is warm and dry.     Coloration: Skin is not pale.  Neurological:     General: No focal deficit present.     Mental Status: He is alert and oriented to person, place, and time.      Motor: No weakness.  Psychiatric:        Mood and Affect: Mood normal.        Behavior: Behavior normal.        Thought Content: Thought content normal.        Judgment: Judgment normal.     Adult ECG Report  Rate: 83 ;  Rhythm: normal sinus rhythm, premature ventricular contractions (PVC) and Left axis deviation with LBBB.;   Narrative Interpretation: Stable  Recent Labs: Reviewed.  Has been off statin since June 2021   05/18/2019: BNP 359  Labs from:  08/15/19 - TC 125, TG 103, HDL 41, LDL 65 (while on Statin)   12/2019 - TC 182, TG 271, HDL 41, LDL 95 Lab Results  Component Value Date   CHOL 213 (H) 02/27/2020   HDL 36 (L) 02/27/2020   LDLCALC 146 (H) 02/27/2020   TRIG 172 (H) 02/27/2020   CHOLHDL 5.9 (H) 02/27/2020   Lab Results  Component Value Date   CREATININE 1.02 01/08/2020   BUN 24 01/08/2020   NA 140 01/08/2020   K 4.6 01/08/2020   CL 101 01/08/2020   CO2 26 01/08/2020   CBC Latest Ref Rng & Units 04/12/2018 03/26/2018 03/31/2017  WBC 4.0 - 10.5 K/uL 8.3 10.4 8.1  Hemoglobin 13.0 - 17.0 g/dL 14.0 13.2 13.6  Hematocrit 39.0 - 52.0 % 42.8 39.6 41.0  Platelets 150 - 400 K/uL 194 187 181    Lab Results  Component Value Date   TSH 1.280 08/08/2017    ==================================================  COVID-19 Education: The signs and symptoms of COVID-19 were discussed with the patient and how to seek care for testing (follow up with PCP or arrange E-visit).   The importance of social distancing and COVID-19 vaccination was discussed today. The patient is practicing social distancing & Masking.   I spent a total of 65minutes with the patient spent in direct patient consultation.  Additional time spent with chart review  / charting (studies, outside notes, etc): 16 min Total Time: 42 min   Current medicines are reviewed at length with the patient today.  (+/- concerns) breathing improved with Lasix.  This visit occurred during the SARS-CoV-2 public  health emergency.  Safety protocols were in place, including screening questions prior to the visit, additional usage of staff PPE, and extensive cleaning of exam room while observing appropriate contact time as indicated for disinfecting solutions.  Notice: This dictation was prepared with Dragon dictation along with smaller phrase technology. Any transcriptional errors that result from this process are unintentional and may not be corrected upon review.   Patient Instructions / Medication Changes & Studies & Tests Ordered   Patient Instructions  Medication Instructions:    restart Rosuvastatin at 10 mg daily    lasix ( furosemide ) 40 mg  Take at least 2 times a week , may take an additional 40 mg if weight is up by 3 lbs in a day  And or worsening shortness of breath ,or swelling.   *If you need a refill on your cardiac medications before your next appointment, please call your pharmacy*   Lab Work:  Not needed  Testing/Procedures:  Will be schedule at [place] has requested that you have an echocardiogram. Echocardiography is a painless test that uses sound waves to create images of your heart. It provides your doctor with information about the size and shape of your heart and how well your heart's chambers and valves are working. This procedure takes approximately one hour. There are no restrictions for this procedure.    Follow-Up: At [facility], you and your health needs are our priority.  As part of our continuing mission to provide you with exceptional heart care, we have created designated Provider Care Teams.  These Care Teams include your primary Cardiologist (physician) and Advanced Practice Providers (APPs -  Physician Assistants and Nurse Practitioners) who all work together to provide you with the care you need, when you need it.     Your next appointment:   3 month(s)  The format for your next appointment:   In  Person  Provider:   Glenetta Hew, MD   Other Instructions   Studies Ordered:   Orders Placed This Encounter  Procedures  . EKG 12-Lead  . ECHOCARDIOGRAM COMPLETE     Glenetta Hew, M.D., M.S. Interventional Cardiologist   Pager # [phone] Phone # [phone] [address] [place], [place] [address]   Thank you for choosing Heart[facility] at [facility]!!

## 2020-05-20 NOTE — Patient Instructions (Signed)
Medication Instructions:    restart Rosuvastatin at 10 mg daily    lasix ( furosemide ) 40 mg  Take at least 2 times a week , may take an additional 40 mg if weight is up by 3 lbs in a day  And or worsening shortness of breath ,or swelling.   *If you need a refill on your cardiac medications before your next appointment, please call your pharmacy*   Lab Work:  Not needed  Testing/Procedures:  Will be schedule at [place] has requested that you have an echocardiogram. Echocardiography is a painless test that uses sound waves to create images of your heart. It provides your doctor with information about the size and shape of your heart and how well your heart's chambers and valves are working. This procedure takes approximately one hour. There are no restrictions for this procedure.    Follow-Up: At [facility], you and your health needs are our priority.  As part of our continuing mission to provide you with exceptional heart care, we have created designated Provider Care Teams.  These Care Teams include your primary Cardiologist (physician) and Advanced Practice Providers (APPs -  Physician Assistants and Nurse Practitioners) who all work together to provide you with the care you need, when you need it.     Your next appointment:   3 month(s)  The format for your next appointment:   In Person  Provider:   Glenetta Hew, MD   Other Instructions

## 2020-05-21 ENCOUNTER — Ambulatory Visit (INDEPENDENT_AMBULATORY_CARE_PROVIDER_SITE_OTHER): Payer: Medicare HMO | Admitting: Family Medicine

## 2020-05-21 ENCOUNTER — Encounter: Payer: Self-pay | Admitting: Family Medicine

## 2020-05-21 VITALS — BP 130/80 | HR 89 | Temp 98.1°F | Ht 70.0 in | Wt 201.0 lb

## 2020-05-21 DIAGNOSIS — R059 Cough, unspecified: Secondary | ICD-10-CM | POA: Diagnosis not present

## 2020-05-21 DIAGNOSIS — R0600 Dyspnea, unspecified: Secondary | ICD-10-CM | POA: Diagnosis not present

## 2020-05-21 DIAGNOSIS — R0609 Other forms of dyspnea: Secondary | ICD-10-CM

## 2020-05-21 NOTE — Patient Instructions (Addendum)
Glad to hear that you are feeling better.  Continue plan from cardiology including echocardiogram.  Follow-up if any new or worsening symptoms.  Otherwise I will see you in June.  Take care    If you have lab work done today you will be contacted with your lab results within the next 2 weeks.  If you have not heard from [place] then please contact us. The fastest way to get your results is to register for My Chart.   IF you received an x-ray today, you will receive an invoice from [facility]. Please contact [facility] at [phone] with questions or concerns regarding your invoice.   IF you received labwork today, you will receive an invoice from [facility]. Please contact [facility] at [phone] with questions or concerns regarding your invoice.   Our billing staff will not be able to assist you with questions regarding bills from these companies.  You will be contacted with the lab results as soon as they are available. The fastest way to get your results is to activate your My Chart account. Instructions are located on the last page of this paperwork. If you have not heard from [place] regarding the results in 2 weeks, please contact this office.

## 2020-05-21 NOTE — Progress Notes (Signed)
Subjective:  Patient ID: [name] [name], male    DOB: July 03, 1945  Age: 75 y.o. MRN: [id number]  CC:  Chief Complaint  Patient presents with  . Follow-up    On recent lab work and a Brain natriuretic peptide lab the pt had done recently.     HPI [name] [name] presents for   Follow-up dyspnea.  Exertional.  No chest pain.  Last visit February 17 with video visit.  Approximately 3-week history of cough initially worse improved then slight worsening recently.  Chest x-ray with trace bilateral pleural effusions on February 18.  Discussed with patient on February 19.  Urgent care eval on February 20.  EKG at that time was reported comparable to previous EKG in 2019.  BNP was elevated at 359.  Start on furosemide for possible CHF component.  He was seen by cardiology yesterday, Dr. Ellyn Hack.  Restarted rosuvastatin 10 mg daily (body aches in past - will monitor for tolerance).  Furosemide 40 mg 2 times per week with additional 40 mg if weight up 3 pounds in a day, worsening shortness of breath or swelling.  Echocardiogram ordered.  Breathing is better. Will be picking up more furosemide. Feels better after taking furosemide past 3 days. Breathing better and able to go for a walk today. Cutting back on salt (reports cardiologist advised that salt intake may be contributing).  No chest pain. No cough past few days.   Wt Readings from Last 3 Encounters:  05/21/20 201 lb (91.2 kg)  05/20/20 199 lb (90.3 kg)  05/14/20 196 lb (88.9 kg)       History Patient Active Problem List   Diagnosis Date Noted  . Acute on chronic HFrEF (heart failure with reduced ejection fraction) ([place]) 05/20/2020  . Claudication ([place]) 09/01/2019  . S/P cervical spinal fusion 04/20/2018  . Paresthesia 01/10/2018  . Neck pain 01/10/2018  . LBBB (left bundle branch block) 09/07/2017  . Other meniscus derangements, posterior horn of medial meniscus, left knee, insufficency fracture medial tibial plateau  04/07/2017  . Closed nondisplaced fracture of lateral condyle of left tibia with routine healing 04/07/2017  . Status post arthroscopy of left knee 04/07/2017  . Insomnia 09/14/2015  . Hyperlipidemia with target LDL less than 100 09/11/2014  . Rotator cuff tendinitis 04/28/2014  . Obesity (BMI 30-39.9) 12/29/2013  . 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  . Exertional shortness of breath 06/26/2012  . Polyp of colon, adenomatous 04/27/2012  . Elevated PSA 10/20/2011  . Hypertriglyceridemia 07/08/2011  . Nonischemic cardiomyopathy ([place]) 09/04/2009   Past Medical History:  Diagnosis Date  . Arthritis   . BPH (benign prostatic hypertrophy)   . Coronary atherosclerosis of native coronary vessel    CATH 2004--  MINIMAL NONOBSTRUCTIVE LAD/  NORMAL EF/    CARDIAC CT  03/2008  NO SIGNIFICANT DISEASE  AND  nlef  . Elevated PSA   . Eye problem 03/06/2016   [name] retinal detachment left eye, no sx done  . Headache   . History of gout    pt 05-20-2013 states stable   . History of melanoma excision    2011-- LEFT UPPER BACK  . Hyperlipemia   . Hypertriglyceridemia   . Left bundle branch block    08/2017  . Melanoma ([place]) 2011   back  . Nonischemic cardiomyopathy ([place])    09-04-2009  --  2D echo-EF 40-45%, Global Hypokinesis - 09/2017 -> EF 45-50%   . Numbness    hands  . Personal  history of arthritis   . Personal history of other diseases of circulatory system    Past Surgical History:  Procedure Laterality Date  . ANTERIOR CERVICAL DECOMP/DISCECTOMY FUSION N/A 04/20/2018   Procedure: Anterior Cervical Decompression Fusion - Cervical Three-Cervical Four - Cervical Four-Cervical Five - Cervical Five-Cervical Six;  Surgeon: Eustace Moore, MD;  Location: [facility];  Service: Neurosurgery;  Laterality: N/A;  Anterior Cervical Decompression Fusion - Cervical Three-Cervical Four - Cervical Four-Cervical Five - Cervical Five-Cervical Six  . APPENDECTOMY  AS CHILD  . BACK SURGERY    .  CARDIAC CATHETERIZATION  05/23/2002  &  05-23-1998   minimal irregularities in the LAD/  EF 50%  -   DR AL LITTLE  . CARDIOVASCULAR STRESS TEST  10-05-1998   MILD GLOBAL HYPOKINESIS AND ISCHEMIAIN ANTEROSEPTAL  AT [facility]/ EF 42%  . CARPAL TUNNEL RELEASE Right   . EXCISION MELANOMA LEFT UPPER BACK  10-14-2009  . eye lid surgery Bilateral 2018  . KNEE ARTHROSCOPY WITH SUBCHONDROPLASTY Left 04/07/2017   Procedure: LEFT KNEE ARTHROSCOPY WITH PARTIAL MEDIAL MENISCECTOMY AND MEDIAL TIBIAL SUBCHONDROPLASTY;  Surgeon: Mcarthur Rossetti, MD;  Location: [facility];  Service: Orthopedics;  Laterality: Left;  . knee injections Bilateral   . LUMBAR DISC SURGERY  09-12-2000   left  L5 -- S1  . PROSTATE BIOPSY N/A 05/22/2013   Procedure: BIOPSY TRANSRECTAL ULTRASONIC PROSTATE (TUBP);  Surgeon: [name], MD;  Location[facility];  Service: Urology;  Laterality: N/A;  . ROTATOR CUFF REPAIR Right 2016  . THROAT SURGERY  04/20/2018  . TRANSRECTAL ULTRASOUND PROSTATE BX  05-23-2005  &  04-19-2001  . TRANSTHORACIC ECHOCARDIOGRAM  09/2017   EF 45-50 % (previously reported as 40 to 45%).  Incoordinate septal motion with mild LVH.  GR 1 DD.  Aortic sclerosis but no stenosis.   Allergies  Allergen Reactions  . Nitroglycerin Anaphylaxis    "Cardiologist suggested he shouldn't take this med because it could kill him with his heart condition. Lowers BP"  . Crestor [Rosuvastatin] Other (See Comments)    Body aches  . Ace Inhibitors Swelling  . Lovastatin Nausea Only  . Solarcaine [Benzocaine] Dermatitis   Prior to Admission medications   Medication Sig Start Date End Date Taking? Authorizing Provider  aspirin EC 81 MG tablet Take 1 tablet (81 mg total) by mouth daily. 04/26/18   Costella, Vista Mink, PA-C  furosemide (LASIX) 40 MG tablet Take  40 mg  twice a week , may take an additional 40 mg if wieight is greater than 3 lbs and  Or increase shortness of breath ,or swelling 05/20/20   Leonie Man, MD  gabapentin (NEURONTIN) 300 MG capsule TAKE 1 TO 2 CAPSULES BY  MOUTH 3 TIMES DAILY AS  NEEDED FOR PAIN. 02/06/20   [name], MD  Garlic 1093 MG CAPS Take 1,000 mg by mouth daily.    [provider]  magnesium oxide (MAG-OX) 400 MG tablet Take 400 mg by mouth daily.    [provider]  Multiple Vitamin (MULTIVITAMIN WITH MINERALS) TABS tablet Take 1 tablet by mouth daily. One-A-Day Multivitamin    [provider]  Omega-3 Fatty Acids (FISH OIL) 500 MG CAPS Take 500 mg by mouth daily.    [provider]  rosuvastatin (CRESTOR) 10 MG tablet Take 1 tablet (10 mg total) by mouth daily. 05/20/20 08/18/20  Leonie Man, MD  sildenafil (VIAGRA) 100 MG tablet 1/2 to 1 tablet up to  each day as needed. Patient taking differently: Take 50-100 mg by mouth daily as needed for erectile dysfunction. 07/08/11   [name], MD  Turmeric 500 MG TABS Take 500 mg by mouth daily.    [provider]  vitamin B-12 (CYANOCOBALAMIN) 500 MCG tablet Take 500 mcg by mouth daily.    [provider]  Zinc 50 MG TABS Take 50 mg by mouth daily.    [provider]   Social History   Socioeconomic History  . Marital status: Widowed    Spouse name: Not on file  . Number of children: 2  . Years of education: 70  . Highest education level: High school graduate  Occupational History  . 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  Tobacco Use  . Smoking status: Former Smoker    Packs/day: 2.00    Years: 20.00    Pack years: 40.00    Types: Cigarettes    Quit date: 03/28/1980    Years since quitting: 40.1  . Smokeless tobacco: Never Used  Vaping Use  . Vaping Use: Never used  Substance and Sexual Activity  . Alcohol use: No    Alcohol/week: 0.0 standard drinks  . Drug use: No  . Sexual activity: Not on file  Other Topics Concern  . Not on file  Social History Narrative   He walks on a  relatively regular basis about 15 minutes at a time mostly to the discomfort. He previously had walked up a 30-40 minutes a time without problems. Education: [employer].   Lives alone.   Right-handed.   One cup coffee daily.   Social Determinants of Health   Financial Resource Strain: Not on file  Food Insecurity: Not on file  Transportation Needs: Not on file  Physical Activity: Not on file  Stress: Not on file  Social Connections: Not on file  Intimate Partner Violence: Not on file    Review of Systems Per [facility]Pi.   Objective:   Vitals:   05/21/20 0928  BP: 130/80  Pulse: 89  Temp: 98.1 F (36.7 C)  TempSrc: Temporal  SpO2: 97%  Weight: 201 lb (91.2 kg)  Height: 5\' 10"  (1.778 m)     Physical Exam Vitals reviewed.  Constitutional:      Appearance: He is well-developed and well-nourished.  HENT:     Head: Normocephalic and atraumatic.  Eyes:     Extraocular Movements: EOM normal.     Pupils: Pupils are equal, round, and reactive to light.  Neck:     Vascular: No carotid bruit or JVD.  Cardiovascular:     Rate and Rhythm: Normal rate and regular rhythm.     Heart sounds: Normal heart sounds. No murmur heard.   Pulmonary:     Effort: Pulmonary effort is normal. No respiratory distress.     Breath sounds: Normal breath sounds. No stridor. No wheezing, rhonchi or rales.  Musculoskeletal:        General: No edema.     Right lower leg: No edema.     Left lower leg: No edema.  Skin:    General: Skin is warm and dry.  Neurological:     General: No focal deficit present.     Mental Status: He is alert and oriented to person, place, and time.  Psychiatric:        Mood and Affect: Mood and affect normal.    Assessment & Plan:  [name] [name] is  a 75 y.o. male . DOE (dyspnea on exertion)  Cough  -Improved after furosemide, cutting back on sodium.  Possible fluid overload component.  Appears euvolemic today.  Echocardiogram pending after cardiology visit.   Has plan for use of furosemide in the interim.  RTC precautions.  No orders of the defined types were placed in this encounter.  Patient Instructions   Glad to hear that you are feeling better.  Continue plan from cardiology including echocardiogram.  Follow-up if any new or worsening symptoms.  Otherwise I will see you in June.  Take care    If you have lab work done today you will be contacted with your lab results within the next 2 weeks.  If you have not heard from [place] then please contact us. The fastest way to get your results is to register for My Chart.   IF you received an x-ray today, you will receive an invoice from [facility]. Please contact [facility] at [phone] with questions or concerns regarding your invoice.   IF you received labwork today, you will receive an invoice from [facility]. Please contact [facility] at [phone] with questions or concerns regarding your invoice.   Our billing staff will not be able to assist you with questions regarding bills from these companies.  You will be contacted with the lab results as soon as they are available. The fastest way to get your results is to activate your My Chart account. Instructions are located on the last page of this paperwork. If you have not heard from [place] regarding the results in 2 weeks, please contact this office.         Signed, [name], MD [facility]

## 2020-05-25 ENCOUNTER — Encounter: Payer: Self-pay | Admitting: Cardiology

## 2020-05-25 NOTE — Assessment & Plan Note (Addendum)
Previous echo had mildly reduced EF.  We will recheck an echocardiogram given new onset of symptoms.

## 2020-05-25 NOTE — Assessment & Plan Note (Signed)
LDL is well controlled on statin in the past.  He thinks he can think his symptoms got better when he started Neurontin.  It may notice much difference in his statin holiday.  We will restart rosuvastatin 10 mg daily.  Lab results from May through December reviewed.  Dramatic change being off of statin.

## 2020-05-25 NOTE — Assessment & Plan Note (Signed)
I am not sure if the LBBB is the cause of the reduced EF or the other way around. With new onset CHF symptoms, will recheck 2D echo.

## 2020-05-25 NOTE — Assessment & Plan Note (Signed)
Borderline low EF by echo.  Had not had any episodes of heart failure until recently. Now complaining of cough and dyspnea.  Not really any edema.  BNP mildly elevated.  Plan:  Refill Lasix 40 mg-take at least 2 times a weeks, and as needed for weight gain greater than 3 pounds, worsening shortness of breath or swelling.  He has not been on any blood pressure medications, monitor pressures and consider afterload reduction with ARB.  (In the past, blood pressures been too low for medications.)  Check 2D echocardiogram to reassess EF.

## 2020-05-25 NOTE — Assessment & Plan Note (Signed)
He has always had a little exertional dyspnea, now his dyspnea is worsened.  Rechecking 2D echo, starting Lasix.  Follow-up in 3 months to discuss results of echo and reassess symptoms.  If pressure still elevated, would probably add an ARB.

## 2020-06-09 ENCOUNTER — Encounter (HOSPITAL_COMMUNITY): Payer: Self-pay

## 2020-06-09 ENCOUNTER — Emergency Department (HOSPITAL_COMMUNITY)
Admission: EM | Admit: 2020-06-09 | Discharge: 2020-06-09 | Disposition: A | Discharge status: Home / Self Care | Payer: Medicare HMO | Attending: Emergency Medicine | Admitting: Emergency Medicine

## 2020-06-09 ENCOUNTER — Other Ambulatory Visit: Payer: Self-pay

## 2020-06-09 ENCOUNTER — Emergency Department (HOSPITAL_COMMUNITY): Payer: Medicare HMO

## 2020-06-09 DIAGNOSIS — I25811 Atherosclerosis of native coronary artery of transplanted heart without angina pectoris: Secondary | ICD-10-CM | POA: Insufficient documentation

## 2020-06-09 DIAGNOSIS — Z85820 Personal history of malignant melanoma of skin: Secondary | ICD-10-CM | POA: Insufficient documentation

## 2020-06-09 DIAGNOSIS — I2510 Atherosclerotic heart disease of native coronary artery without angina pectoris: Secondary | ICD-10-CM | POA: Diagnosis not present

## 2020-06-09 DIAGNOSIS — Z7982 Long term (current) use of aspirin: Secondary | ICD-10-CM | POA: Diagnosis not present

## 2020-06-09 DIAGNOSIS — I700 Atherosclerosis of aorta: Secondary | ICD-10-CM | POA: Diagnosis not present

## 2020-06-09 DIAGNOSIS — M4802 Spinal stenosis, cervical region: Secondary | ICD-10-CM | POA: Diagnosis not present

## 2020-06-09 DIAGNOSIS — I5033 Acute on chronic diastolic (congestive) heart failure: Secondary | ICD-10-CM | POA: Diagnosis not present

## 2020-06-09 DIAGNOSIS — Z79899 Other long term (current) drug therapy: Secondary | ICD-10-CM | POA: Diagnosis not present

## 2020-06-09 DIAGNOSIS — Z87891 Personal history of nicotine dependence: Secondary | ICD-10-CM | POA: Insufficient documentation

## 2020-06-09 DIAGNOSIS — M542 Cervicalgia: Secondary | ICD-10-CM | POA: Insufficient documentation

## 2020-06-09 DIAGNOSIS — M47812 Spondylosis without myelopathy or radiculopathy, cervical region: Secondary | ICD-10-CM | POA: Diagnosis not present

## 2020-06-09 MED ORDER — PREDNISONE 10 MG PO TABS: 40.0000 mg | ORAL_TABLET | Freq: Every day | ORAL | 0 refills | Status: AC

## 2020-06-09 MED ORDER — OXYCODONE-ACETAMINOPHEN 5-325 MG PO TABS: 1.0000 | ORAL_TABLET | Freq: Four times a day (QID) | ORAL | 0 refills | Status: DC | PRN

## 2020-06-09 NOTE — ED Provider Notes (Signed)
[facility] DEPT Provider Note   CSN: [id number] Arrival date & time: 06/09/20  0009     History Chief Complaint  Patient presents with  . Neck Injury    [name] [name] is a 75 y.o. male history includes arthritis, CAD, gout, hyperlipidemia, LBBB, cardiomyopathy, melanoma, cervical spine fusion.  Patient reports right-sided neck pain onset 7-10 days ago.  He reports the week prior to onset of pain he was working a lot lifting, pain came on gradually he reports sharp pain right side the neck only present whenever he turns his head to the right improves with staying still pain occasionally shoots up to the right side of his head.  Patient reports in January 2021 he had ACDF with Dr. Ronnald Ramp  Denies fever/chills, headache, vision changes, numbness/tingling, weakness, saddle paresthesias, bowel/bladder incontinence, urinary retention, fall/injury or any additional concerns.  HPI     Past Medical History:  Diagnosis Date  . Arthritis   . BPH (benign prostatic hypertrophy)   . Coronary atherosclerosis of native coronary vessel    CATH 2004--  MINIMAL NONOBSTRUCTIVE LAD/  NORMAL EF/    CARDIAC CT  03/2008  NO SIGNIFICANT DISEASE  AND  nlef  . Elevated PSA   . Eye problem 03/06/2016   [name] retinal detachment left eye, no sx done  . Headache   . History of gout    pt 05-20-2013 states stable   . History of melanoma excision    2011-- LEFT UPPER BACK  . Hyperlipemia   . Hypertriglyceridemia   . Left bundle branch block    08/2017  . Melanoma ([employer]) 2011   back  . Nonischemic cardiomyopathy ([place])    09-04-2009  --  2D echo-EF 40-45%, Global Hypokinesis - 09/2017 -> EF 45-50%   . Numbness    hands  . Personal history of arthritis   . Personal history of other diseases of circulatory system     Patient Active Problem List   Diagnosis Date Noted  . Acute on chronic HFrEF (heart failure with reduced ejection fraction) ([place]) 05/20/2020  .  Claudication ([place]) 09/01/2019  . S/P cervical spinal fusion 04/20/2018  . Paresthesia 01/10/2018  . Neck pain 01/10/2018  . LBBB (left bundle branch block) 09/07/2017  . Other meniscus derangements, posterior horn of medial meniscus, left knee, insufficency fracture medial tibial plateau 04/07/2017  . Closed nondisplaced fracture of lateral condyle of left tibia with routine healing 04/07/2017  . Status post arthroscopy of left knee 04/07/2017  . Insomnia 09/14/2015  . Hyperlipidemia with target LDL less than 100 09/11/2014  . Rotator cuff tendinitis 04/28/2014  . Obesity (BMI 30-39.9) 12/29/2013  . 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  . Exertional shortness of breath 06/26/2012  . Polyp of colon, adenomatous 04/27/2012  . Elevated PSA 10/20/2011  . Hypertriglyceridemia 07/08/2011  . Nonischemic cardiomyopathy ([place]) 09/04/2009    Past Surgical History:  Procedure Laterality Date  . ANTERIOR CERVICAL DECOMP/DISCECTOMY FUSION N/A 04/20/2018   Procedure: Anterior Cervical Decompression Fusion - Cervical Three-Cervical Four - Cervical Four-Cervical Five - Cervical Five-Cervical Six;  Surgeon: Eustace Moore, MD;  Location: [facility];  Service: Neurosurgery;  Laterality: N/A;  Anterior Cervical Decompression Fusion - Cervical Three-Cervical Four - Cervical Four-Cervical Five - Cervical Five-Cervical Six  . APPENDECTOMY  AS CHILD  . BACK SURGERY    . CARDIAC CATHETERIZATION  05/23/2002  &  05-23-1998   minimal irregularities in the LAD/  EF 50%  -   DR  AL LITTLE  . CARDIOVASCULAR STRESS TEST  10-05-1998   MILD GLOBAL HYPOKINESIS AND ISCHEMIAIN ANTEROSEPTAL  AT [facility]/ EF 42%  . CARPAL TUNNEL RELEASE Right   . EXCISION MELANOMA LEFT UPPER BACK  10-14-2009  . eye lid surgery Bilateral 2018  . KNEE ARTHROSCOPY WITH SUBCHONDROPLASTY Left 04/07/2017   Procedure: LEFT KNEE ARTHROSCOPY WITH PARTIAL MEDIAL MENISCECTOMY AND MEDIAL TIBIAL SUBCHONDROPLASTY;  Surgeon: Mcarthur Rossetti, MD;   Location: [facility];  Service: Orthopedics;  Laterality: Left;  . knee injections Bilateral   . LUMBAR DISC SURGERY  09-12-2000   left  L5 -- S1  . PROSTATE BIOPSY N/A 05/22/2013   Procedure: BIOPSY TRANSRECTAL ULTRASONIC PROSTATE (TUBP);  Surgeon: [name], MD;  Location: [facility];  Service: Urology;  Laterality: N/A;  . ROTATOR CUFF REPAIR Right 2016  . THROAT SURGERY  04/20/2018  . TRANSRECTAL ULTRASOUND PROSTATE BX  05-23-2005  &  04-19-2001  . TRANSTHORACIC ECHOCARDIOGRAM  09/2017   EF 45-50 % (previously reported as 40 to 45%).  Incoordinate septal motion with mild LVH.  GR 1 DD.  Aortic sclerosis but no stenosis.       Family History  Problem Relation Age of Onset  . Arthritis Mother   . COPD Father   . Cancer Father        lung cancer  . Cancer Sister   . Cardiomyopathy Other        idiopathic. trivial disease 2004 cath, 2010 cardiac CT - no sig. dz, nl LV fxn. cards: Little  . Colon cancer Neg Hx     Social History   Tobacco Use  . Smoking status: Former Smoker    Packs/day: 2.00    Years: 20.00    Pack years: 40.00    Types: Cigarettes    Quit date: 03/28/1980    Years since quitting: 40.2  . Smokeless tobacco: Never Used  Vaping Use  . Vaping Use: Never used  Substance Use Topics  . Alcohol use: No    Alcohol/week: 0.0 standard drinks  . Drug use: No    Home Medications Prior to Admission medications   Medication Sig Start Date End Date Taking? Authorizing Provider  oxyCODONE-acetaminophen (PERCOCET/ROXICET) 5-325 MG tablet Take 1 tablet by mouth every 6 (six) hours as needed for severe pain. 06/09/20  Yes Nuala Alpha A, PA-C  predniSONE (DELTASONE) 10 MG tablet Take 4 tablets (40 mg total) by mouth daily for 5 days. 06/09/20 06/14/20 Yes Deliah Boston, PA-C  aspirin EC 81 MG tablet Take 1 tablet (81 mg total) by mouth daily. 04/26/18   Costella, Vista Mink, PA-C  furosemide (LASIX) 40 MG tablet Take  40 mg  twice a week , may take  an additional 40 mg if wieight is greater than 3 lbs and  Or increase shortness of breath ,or swelling 05/20/20   Leonie Man, MD  gabapentin (NEURONTIN) 300 MG capsule TAKE 1 TO 2 CAPSULES BY  MOUTH 3 TIMES DAILY AS  NEEDED FOR PAIN. 02/06/20   [name], MD  Garlic 6812 MG CAPS Take 1,000 mg by mouth daily.    [provider]  magnesium oxide (MAG-OX) 400 MG tablet Take 400 mg by mouth daily.    [provider]  Multiple Vitamin (MULTIVITAMIN WITH MINERALS) TABS tablet Take 1 tablet by mouth daily. One-A-Day Multivitamin    [provider]  Omega-3 Fatty Acids (FISH OIL) 500 MG CAPS Take 500 mg by mouth daily.  [provider]  rosuvastatin (CRESTOR) 10 MG tablet Take 1 tablet (10 mg total) by mouth daily. 05/20/20 08/18/20  Leonie Man, MD  sildenafil (VIAGRA) 100 MG tablet 1/2 to 1 tablet up to each day as needed. Patient taking differently: Take 50-100 mg by mouth daily as needed for erectile dysfunction. 07/08/11   [name], MD  Turmeric 500 MG TABS Take 500 mg by mouth daily.    [provider]  vitamin B-12 (CYANOCOBALAMIN) 500 MCG tablet Take 500 mcg by mouth daily.    [provider]  Zinc 50 MG TABS Take 50 mg by mouth daily.    [provider]    Allergies    Nitroglycerin, Crestor [rosuvastatin], Ace inhibitors, Lovastatin, and Solarcaine [benzocaine]  Review of Systems   Review of Systems Ten systems are reviewed and are negative for acute change except as noted in the HPI  Physical Exam Updated Vital Signs BP (!) 134/107   Pulse 72   Temp 97.7 F (36.5 C) (Oral)   Resp 17   Ht 5\' 10"  (1.778 m)   Wt 88.9 kg   SpO2 99%   BMI 28.12 kg/m   Physical Exam Constitutional:      General: He is not in acute distress.    Appearance: Normal appearance. He is well-developed. He is not ill-appearing or diaphoretic.  HENT:     Head: Normocephalic and atraumatic.     Jaw: There is normal jaw  occlusion.     Comments: No temporal artery tenderness to palpation.    Mouth/Throat:     Mouth: Mucous membranes are moist.     Pharynx: Oropharynx is clear.  Eyes:     General: Vision grossly intact. Gaze aligned appropriately.     Pupils: Pupils are equal, round, and reactive to light.  Neck:     Trachea: Trachea and phonation normal. No tracheal tenderness or tracheal deviation.      Comments: Right lateral paraspinal cervical muscle tenderness no overlying skin change.  Pain with rotation to the right. Pulmonary:     Effort: Pulmonary effort is normal. No respiratory distress.  Abdominal:     General: There is no distension.     Palpations: Abdomen is soft.     Tenderness: There is no abdominal tenderness. There is no guarding or rebound.  Musculoskeletal:        General: Normal range of motion.     Cervical back: No edema, erythema, signs of trauma or rigidity. Pain with movement and muscular tenderness present. No spinous process tenderness.  Skin:    General: Skin is warm and dry.  Neurological:     Mental Status: He is alert.     GCS: GCS eye subscore is 4. GCS verbal subscore is 5. GCS motor subscore is 6.     Comments: Speech is clear and goal oriented, follows commands Major Cranial nerves without deficit, no facial droop Moves extremities without ataxia, coordination intact  Psychiatric:        Behavior: Behavior normal.     ED Results / Procedures / Treatments   Labs (all labs ordered are listed, but only abnormal results are displayed) Labs Reviewed - No data to display  EKG None  Radiology CT Cervical Spine Wo Contrast  Result Date: 06/09/2020 CLINICAL DATA:  Cervicalgia EXAM: CT CERVICAL SPINE WITHOUT CONTRAST TECHNIQUE: Multidetector CT imaging of the cervical spine was performed without intravenous contrast. Multiplanar CT image reconstructions were also generated. COMPARISON:  None. FINDINGS:  Alignment: There is no spondylolisthesis. Skull base and  vertebrae: Skull base and craniocervical junction regions appear normal. There is pannus posterior to the odontoid without significant impression on the craniocervical junction. There is anterior screw and plate fixation from C3 through C6 with disc spacers at C3-4, C4-5, and C5-6. No evident fracture. No blastic or lytic bone lesions. Soft tissues and spinal canal: Prevertebral soft tissues and predental space regions are normal. No evident cord or canal hematoma. No evident paraspinous lesions. 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 There is moderate disc space narrowing at C6-7 and C7 T1. At C2-3, there is moderate facet hypertrophy bilaterally, more on the right than the left, with exit foraminal narrowing due to bony hypertrophy. No appreciable nerve root effacement. No disc extrusion or stenosis. At C3-4, there is moderate facet hypertrophy bilaterally. There is exit foraminal narrowing bilaterally, slightly more severe on the left than on the right without appreciable nerve root edema or effacement. No disc extrusion or stenosis. At C4-5, there is mild to moderate facet hypertrophy bilaterally. There is exit foraminal narrowing on the left due to bony hypertrophy. No appreciable nerve root edema or effacement. No disc extrusion or stenosis. At C5-6, there is moderate facet hypertrophy bilaterally with impression on the exiting nerve root on the left due to bony hypertrophy. No frank disc extrusion or stenosis. No appreciable nerve root edema or effacement. At C6-7, there is moderate facet hypertrophy bilaterally. No disc extrusion or stenosis. No appreciable nerve root edema or effacement. At C7-T1, there is moderate facet hypertrophy bilaterally. No nerve root edema or effacement. No disc extrusion or stenosis. Upper chest: Visualized upper lung regions are clear. Other: There is aortic atherosclerosis. Foci of calcification noted in each carotid artery. IMPRESSION: Status post anterior screw  and plate fixation from N8-G9 with disc spacers at C3-4, C4-5, and C5-6. Support hardware intact. Multilevel osteoarthritic change. No frank disc extrusion or stenosis. Impression on several exiting nerve roots due to bony hypertrophy without overt nerve root edema or effacement. No fracture or spondylolisthesis. Aortic atherosclerosis. Foci of calcification in each carotid artery. Aortic Atherosclerosis (ICD10-I70.0). Electronically Signed   By: Lowella Grip III M.D.   On: 06/09/2020 08:13    Procedures Procedures   Medications Ordered in ED Medications - No data to display  ED Course  I have reviewed the triage vital signs and the nursing notes.  Pertinent labs & imaging results that were available during my care of the patient were reviewed by me and considered in my medical decision making (see chart for details).    MDM Rules/Calculators/A&P                         Additional history obtained from: 1. Nursing notes from this visit. 2. Family at bedside. 3. EMR. ------------------------------------ 75 year old male presented with right-sided neck pain began over a week ago.  He believes he may have strained his neck while working and lifting.  He is tender to the right lateral neck muscles there is no overlying skin changes suggest infectious process.  He denies any vision changes headache or fevers.  Low suspicion for meningitis.  He has no neurologic complaint.  Tenderness is very reproducible to palpation and with rotation to the right.  Additionally there is no cervical or temporal artery tenderness to palpation.  Suspect muscular strain however given patient's history of ACDF will obtain CT cervical spine for further evaluation. ---------------- CT Cervical Spine:  IMPRESSION:  Status post anterior screw and plate fixation from L4-Y5 with disc  spacers at C3-4, C4-5, and C5-6. Support hardware intact.    Multilevel osteoarthritic change. No frank disc extrusion or   stenosis. Impression on several exiting nerve roots due to bony  hypertrophy without overt nerve root edema or effacement.    No fracture or spondylolisthesis.    Aortic atherosclerosis. Foci of calcification in each carotid  artery.    Aortic Atherosclerosis (ICD10-I70.0).   Patient reassessed he is resting comfortably in bed no acute distress.  Patient states understanding of findings above has no questions or additional concerns.  Patient denies any history of diabetes will start patient on short burst (five day course) of prednisone to help with his symptoms.  I have also asked patient to call his neurosurgeon to schedule follow-up appointment.  Finally will prescribe patient short course of Percocet to help with his pain.  Risks versus benefits and precautions regarding narcotics discussed with patient and his family member and they state understanding. 4 tablets (four) were prescribed for acute right sided neck pain.  PMDP reviewed patient was given 1 diazepam in September 2020, no other narcotic prescriptions.  At this time there does not appear to be any evidence of an acute emergency medical condition and the patient appears stable for discharge with appropriate outpatient follow up. Diagnosis was discussed with patient who verbalizes understanding of care plan and is agreeable to discharge. I have discussed return precautions with patient and family member who verbalizes understanding. Patient encouraged to follow-up with their PCP and neurosurgery. All questions answered.  Patient seen and evaluated by Dr. Roderic Palau during this visit who agrees with discharge with Percocet and prednisone and neurosurgery follow-up  Note: Portions of this report may have been transcribed using voice recognition software. Every effort was made to ensure accuracy; however, inadvertent computerized transcription errors may still be present. Final Clinical Impression(s) / ED Diagnoses Final diagnoses:   Neck pain    Rx / DC Orders ED Discharge Orders         Ordered    predniSONE (DELTASONE) 10 MG tablet  Daily        06/09/20 0843    oxyCODONE-acetaminophen (PERCOCET/ROXICET) 5-325 MG tablet  Every 6 hours PRN        06/09/20 0843           Deliah Boston, PA-C 06/09/20 Rudolpho Sevin, MD 06/13/20 1539

## 2020-06-09 NOTE — ED Triage Notes (Signed)
Pt to ED by POV from home with c/o nontraumatic right sided neck pain which began 8-10 days ago which has been intermittent. Pt states it is a shooting pain into his head. Cant get an appointment with his PCP for 3 weeks. Pt endorses taking 2 oxycodone prior to arrival. Arrives A+O, VSS, NADN.

## 2020-06-09 NOTE — Discharge Instructions (Addendum)
At this time there does not appear to be the presence of an emergent medical condition, however there is always the potential for conditions to change. Please read and follow the below instructions.  Please return to the Emergency Department immediately for any new or worsening symptoms. Please be sure to follow up with your Primary Care Provider within one week regarding your visit today; please call their office to schedule an appointment even if you are feeling better for a follow-up visit. Please call your neurosurgeon today to schedule a follow-up appointment. Your CT scan showed multilevel arthritic changes as well as impression on several nerves due to bony hypertrophy.  Your CT scan also showed aortic atherosclerosis and calcifications in your carotid arteries.  Please discuss these findings with your neurosurgeon and your primary care provider at your follow-up visit You may take the medication Percocet (Oxycodone/Acetaminophen) as prescribed to help with severe pain.  If you feel that your pain is not severe then do not take Percocet. This medication will make you drowsy so do not drive, drink alcohol, take other sedating medications or perform any dangerous activities such as driving after taking Percocet. Percocet contains Tylenol (acetaminophen) so do not take any other Tylenol-containing products with Norco. Please take the medication prednisone as prescribed to help with your symptoms.  Go to the nearest Emergency Department immediately if: You have fever or chills You have very bad pain. You get any of the following in any part of your body: Loss of feeling. Tingling. Weakness. You cannot move a part of your body. You have neck pain and either of these: Very bad dizziness. A very bad headache. You have a fall or injuries You have any new/concerning or worsening of symptoms   Please read the additional information packets attached to your discharge summary.  Do not take your  medicine if  develop an itchy rash, swelling in your mouth or lips, or difficulty breathing; call 911 and seek immediate emergency medical attention if this occurs.  You may review your lab tests and imaging results in their entirety on your MyChart account.  Please discuss all results of fully with your primary care provider and other specialist at your follow-up visit.  Note: Portions of this text may have been transcribed using voice recognition software. Every effort was made to ensure accuracy; however, inadvertent computerized transcription errors may still be present.

## 2020-06-11 ENCOUNTER — Telehealth: Payer: Self-pay | Admitting: Family Medicine

## 2020-06-11 DIAGNOSIS — M4802 Spinal stenosis, cervical region: Secondary | ICD-10-CM

## 2020-06-11 MED ORDER — TRAMADOL HCL 50 MG PO TABS: 50.0000 mg | ORAL_TABLET | Freq: Three times a day (TID) | ORAL | 0 refills | Status: DC | PRN

## 2020-06-11 NOTE — Telephone Encounter (Signed)
ER note reviewed , appeared to be muscular strain.  It appears he was given a temporary prescription for oxycodone but then also prednisone which should have helped with his symptoms.  He has taken gabapentin in the past and we have discussed 600 mg twice daily dosing when he has had neck issues in the past.  Is he taking that dose?  If not would try that first.   I will send in temporary rx for tramadol, but I do not feel comfortable prescribing oxycodone without evaluating him (and if he is having any worsening pain, or to point of needing oxycodone then I do recommend he be seen in urgent care/ER).

## 2020-06-11 NOTE — Telephone Encounter (Signed)
Spoke with patient and is refusing to make appt. Patient states that he lives 30 miles from office and refuses to make that drive. Confirmed with patient that the pharmacy he would like to use is located in [place], but patient continues to refuse appt and would like his refill today as well as a call back from the office. Please advise patient.

## 2020-06-11 NOTE — Telephone Encounter (Signed)
Pt is requesting a refill on Oxycodone for neck pain. Pt was seen by the ED for this issue on 06/09/2020 and was given 4 tab with no refills. Please advise.

## 2020-06-11 NOTE — Telephone Encounter (Signed)
Patient called to request courtesy refill of oxycodone for neck pain.   oxyCODONE-acetaminophen (PERCOCET/ROXICET) 5-325 MG tablet [id number]   Pharmacy:   [id number] - [place], [place] - [address]  [address] [place] [address]  Phone:  [phone] Fax:  [phone]  DEA #:  [id number]  Patient took his last pill last night and has pain.   Patient has 1 remaining dose of   predniSONE (DELTASONE) 10 MG tablet [[id number]]    Taking it for inflammation. Patient unsure if refill needed.    Please advise asap at [phone].

## 2020-06-11 NOTE — Telephone Encounter (Signed)
-----   Message from Penelope Coop, [place] sent at 06/11/2020  9:31 AM EDT ----- Pt needs a hospital follow-up appt

## 2020-06-11 NOTE — Telephone Encounter (Signed)
Advised patient that provider is not likely to fill prescription without eval due to recent ED visit and our protocol to complete a HFU. Patient refuses - states that office should read note from ED and refill med. Patient did not verbalize understanding and disagrees with requiring an appt. Patient requiring call back from clinical.

## 2020-06-11 NOTE — Telephone Encounter (Signed)
[name] [name] will not prescribe this without a visit as Dr [name] [name] has not evaluated him for this issue. If he doesn't wish to come here he can go to the ED again

## 2020-06-12 NOTE — Telephone Encounter (Signed)
Patient was informed of Dr Carlota Raspberry msg below. Patient will try med and call to make an appt if no better

## 2020-06-16 ENCOUNTER — Other Ambulatory Visit: Payer: Self-pay

## 2020-06-16 ENCOUNTER — Ambulatory Visit (HOSPITAL_COMMUNITY): Payer: Medicare HMO | Attending: Cardiovascular Disease

## 2020-06-16 DIAGNOSIS — I447 Left bundle-branch block, unspecified: Secondary | ICD-10-CM | POA: Diagnosis not present

## 2020-06-16 DIAGNOSIS — I5023 Acute on chronic systolic (congestive) heart failure: Secondary | ICD-10-CM | POA: Insufficient documentation

## 2020-06-16 DIAGNOSIS — I428 Other cardiomyopathies: Secondary | ICD-10-CM | POA: Diagnosis not present

## 2020-06-16 DIAGNOSIS — R0602 Shortness of breath: Secondary | ICD-10-CM | POA: Diagnosis not present

## 2020-06-16 HISTORY — PX: TRANSTHORACIC ECHOCARDIOGRAM: SHX275

## 2020-06-16 LAB — ECHOCARDIOGRAM COMPLETE
Area-P 1/2: 4.21 cm2
S' Lateral: 5.2 cm

## 2020-06-17 NOTE — H&P (View-Only) (Signed)
Virtual Visit via Telephone Note   This visit type was conducted due to national recommendations for restrictions regarding the COVID-19 Pandemic (e.g. social distancing) in an effort to limit this patient's exposure and mitigate transmission in our community.  Due to his co-morbid illnesses, this patient is at least at moderate risk for complications without adequate follow up.  This format is felt to be most appropriate for this patient at this time.  The patient did not have access to video technology/had technical difficulties with video requiring transitioning to audio format only (telephone).  All issues noted in this document were discussed and addressed.  No physical exam could be performed with this format.  Please refer to the patient's chart for his  consent to telehealth for [facility].  Evaluation Performed:  Follow-up visit  This visit type was conducted due to national recommendations for restrictions regarding the COVID-19 Pandemic (e.g. social distancing).  This format is felt to be most appropriate for this patient at this time.  All issues noted in this document were discussed and addressed.  No physical exam was performed (except for noted visual exam findings with Video Visits).  Please refer to the patient's chart (MyChart message for video visits and phone note for telephone visits) for the patient's consent to telehealth for [name]  Date:  06/19/2020   ID:  [name] [name], DOB 16-Apr-1945, MRN [id number]  Patient Location:  [address] [address]   Provider location:     [place] [address] Office [phone] Fax [phone]   PCP:  [name], MD  Cardiologist:  Glenetta Hew, MD  Electrophysiologist:  None   Chief Complaint: Follow-up visit to review echocardiogram and coronary artery disease/cardiac catheterization recommended  History of Present  Illness:    [name] [name] is a 75 y.o. male who presents via audio/video conferencing for a telehealth visit today.  Patient verified DOB and address.  Mr. 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  He was seen in follow-up by Dr. Ellyn Hack on 05/20/2020.  At that time he reported he was feeling better with having taken 3 days of furosemide.  His breathing was improved.  He denied lower extremity edema.  He still noted exertional dyspnea but denied chest pain and is breathing had significantly improved.  He reported that despite having symptoms he was still walking daily.  An echocardiogram was ordered.  He underwent echocardiogram 06/16/2020.  It showed severely reduced LVEF at less than 20%.  His ventricle was noted to be dilated and not relaxing well.  This is felt to be the clear reason for his shortness of breath.  No significant valvular abnormalities noted.  Cardiac catheterization was recommended.  He is seen virtually today for follow-up and states he continues to have increased dyspnea with increased exertion.  He has remained physically active doing walks and working in his shop.  We reviewed his echocardiogram and recommendations for cardiac catheterization.  He expressed understanding pertaining to risks of cardiac catheterization and agreed to receive blood products if needed.  I will order a CBC, BMP, and have him follow-up after cardiac catheterization.  He was accompanied by [name] a friend who he gave permission to contact with pertinent medical information.  Today he denies chest pain, increased shortness of  breath, lower extremity edema, fatigue, palpitations, melena, hematuria, hemoptysis, diaphoresis, weakness, presyncope, syncope, orthopnea, and PND.   The patient does not symptoms concerning  for COVID-19 infection (fever, chills, cough, or new SHORTNESS OF BREATH).    Prior CV studies:   The following studies were reviewed today:  Echocardiogram 06/16/2020 IMPRESSIONS    1. Left ventricular ejection fraction, by estimation, is <20%. The left  ventricle has severely decreased function. The left ventricle demonstrates  global hypokinesis. The left ventricular internal cavity size was  moderately to severely dilated. There is  mild left ventricular hypertrophy. Left ventricular diastolic parameters  are consistent with Grade II diastolic dysfunction (pseudonormalization).  Elevated left ventricular end-diastolic pressure.  2. Right ventricular systolic function is mildly reduced. The right  ventricular size is normal.  3. Left atrial size was mildly dilated.  4. The mitral valve is grossly normal. Mild mitral valve regurgitation.  5. The aortic valve is tricuspid. There is mild calcification of the  aortic valve. Aortic valve regurgitation is not visualized. No aortic  stenosis is present.  Past Medical History:  Diagnosis Date  . Arthritis   . BPH (benign prostatic hypertrophy)   . Coronary atherosclerosis of native coronary vessel    CATH 2004--  MINIMAL NONOBSTRUCTIVE LAD/  NORMAL EF/    CARDIAC CT  03/2008  NO SIGNIFICANT DISEASE  AND  nlef  . Elevated PSA   . Eye problem 03/06/2016   [name] retinal detachment left eye, no sx done  . Headache   . History of gout    pt 05-20-2013 states stable   . History of melanoma excision    2011-- LEFT UPPER BACK  . Hyperlipemia   . Hypertriglyceridemia   . Left bundle branch block    08/2017  . Melanoma ([place]) 2011   back  . Nonischemic cardiomyopathy ([place])    09-04-2009  --  2D echo-EF 40-45%, Global Hypokinesis - 09/2017 -> EF 45-50%   . Numbness    hands  . Personal history of arthritis   . Personal history of other diseases of circulatory system    Past Surgical History:  Procedure Laterality Date  .  ANTERIOR CERVICAL DECOMP/DISCECTOMY FUSION N/A 04/20/2018   Procedure: Anterior Cervical Decompression Fusion - Cervical Three-Cervical Four - Cervical Four-Cervical Five - Cervical Five-Cervical Six;  Surgeon: Eustace Moore, MD;  Location: [facility];  Service: Neurosurgery;  Laterality: N/A;  Anterior Cervical Decompression Fusion - Cervical Three-Cervical Four - Cervical Four-Cervical Five - Cervical Five-Cervical Six  . APPENDECTOMY  AS CHILD  . BACK SURGERY    . CARDIAC CATHETERIZATION  05/23/2002  &  05-23-1998   minimal irregularities in the LAD/  EF 50%  -   DR AL LITTLE  . CARDIOVASCULAR STRESS TEST  10-05-1998   MILD GLOBAL HYPOKINESIS AND ISCHEMIAIN ANTEROSEPTAL  AT [facility]/ EF 42%  . CARPAL TUNNEL RELEASE Right   . EXCISION MELANOMA LEFT UPPER BACK  10-14-2009  . eye lid surgery Bilateral 2018  . KNEE ARTHROSCOPY WITH SUBCHONDROPLASTY Left 04/07/2017   Procedure: LEFT KNEE ARTHROSCOPY WITH PARTIAL MEDIAL MENISCECTOMY AND MEDIAL TIBIAL SUBCHONDROPLASTY;  Surgeon: Mcarthur Rossetti, MD;  Location: [facility];  Service: Orthopedics;  Laterality: Left;  . knee injections Bilateral   . LUMBAR DISC SURGERY  09-12-2000   left  L5 -- S1  . PROSTATE BIOPSY N/A 05/22/2013   Procedure: BIOPSY TRANSRECTAL ULTRASONIC PROSTATE (TUBP);  Surgeon: [name], MD;  Location[facility];  Service: Urology;  Laterality: N/A;  . ROTATOR CUFF REPAIR Right 2016  . THROAT SURGERY  04/20/2018  . TRANSRECTAL ULTRASOUND PROSTATE BX  05-23-2005  &  04-19-2001  . TRANSTHORACIC ECHOCARDIOGRAM  09/2017   EF 45-50 % (previously reported as 40 to 45%).  Incoordinate septal motion with mild LVH.  GR 1 DD.  Aortic sclerosis but no stenosis.     Current Meds  Medication Sig  . aspirin EC 81 MG tablet Take 1 tablet (81 mg total) by mouth daily.  . furosemide (LASIX) 40 MG tablet Take  40 mg  twice a week , may take an additional 40 mg if wieight is greater than 3 lbs and  Or increase shortness of  breath ,or swelling  . gabapentin (NEURONTIN) 300 MG capsule TAKE 1 TO 2 CAPSULES BY  MOUTH 3 TIMES DAILY AS  NEEDED FOR PAIN.  Marland Kitchen Garlic 0109 MG CAPS Take 1,000 mg by mouth daily.  . magnesium oxide (MAG-OX) 400 MG tablet Take 400 mg by mouth daily.  . Multiple Vitamin (MULTIVITAMIN WITH MINERALS) TABS tablet Take 1 tablet by mouth daily. One-A-Day Multivitamin  . Omega-3 Fatty Acids (FISH OIL) 500 MG CAPS Take 500 mg by mouth daily.  . rosuvastatin (CRESTOR) 10 MG tablet Take 1 tablet (10 mg total) by mouth daily.  . sildenafil (VIAGRA) 100 MG tablet 1/2 to 1 tablet up to each day as needed.  . tamsulosin (FLOMAX) 0.4 MG CAPS capsule Take 0.4 mg by mouth daily.  . traMADol (ULTRAM) 50 MG tablet TAKE 1 TABLET (50 MG TOTAL) BY MOUTH EVERY 8 (EIGHT) HOURS AS NEEDED FOR UP TO 5 DAYS.  . Turmeric 500 MG TABS Take 500 mg by mouth daily.  . vitamin B-12 (CYANOCOBALAMIN) 500 MCG tablet Take 500 mcg by mouth daily.  . Zinc 50 MG TABS Take 50 mg by mouth daily.     Allergies:   Nitroglycerin, Crestor [rosuvastatin], Ace inhibitors, Lovastatin, and Solarcaine [benzocaine]   Social History   Tobacco Use  . Smoking status: Former Smoker    Packs/day: 2.00    Years: 20.00    Pack years: 40.00    Types: Cigarettes    Quit date: 03/28/1980    Years since quitting: 40.2  . Smokeless tobacco: Never Used  Vaping Use  . Vaping Use: Never used  Substance Use Topics  . Alcohol use: No    Alcohol/week: 0.0 standard drinks  . Drug use: No     Family Hx: The patient's family history includes Arthritis in his mother; COPD in his father; Cancer in his father and sister; Cardiomyopathy in an other family member. There is no history of Colon cancer.  ROS:   Please see the history of present illness.     All other systems reviewed and are negative.   Labs/Other Tests and Data Reviewed:    Recent Labs: 01/08/2020: ALT 20; BUN 24; Creatinine, Ser 1.02; Potassium 4.6; Sodium 140 05/17/2020: B  Natriuretic Peptide 359.2   Recent Lipid Panel Lab Results  Component Value Date/Time   CHOL 213 (H) 02/27/2020 11:20 AM   TRIG 172 (H) 02/27/2020 11:20 AM   HDL 36 (L) 02/27/2020 11:20 AM   CHOLHDL 5.9 (H) 02/27/2020 11:20 AM   CHOLHDL 6.5 (H) 10/26/2015 11:14 AM   LDLCALC 146 (H) 02/27/2020 11:20 AM    Wt Readings from Last 3 Encounters:  06/19/20 191 lb (86.6 kg)  06/09/20 196 lb (88.9 kg)  05/21/20 201 lb (91.2 kg)     Exam:  Vital Signs:  BP 122/62   Pulse 96   Ht 5\' 10"  (1.778 m)   Wt 191 lb (86.6 kg)   BMI 27.41 kg/m    Well nourished, well developed male in no  acute distress.   ASSESSMENT & PLAN:    1.  Nonischemic cardiomyopathy/acute on chronic CHF with reduced EF/DOE -echocardiogram showed EF less than 20%.  Denies chest pain.  Continues with dyspnea with exertion. Continue aspirin, furosemide, rosuvastatin, magnesium, sildenafil Cardiac catheterization reviewed Order cardiac catheterization Order CBC/BMP  Shared Decision Making/Informed Consent The risks [stroke (1 in 1000), death (1 in 1000), kidney failure [usually temporary] (1 in 500), bleeding (1 in 200), allergic reaction [possibly serious] (1 in 200)], benefits (diagnostic support and management of coronary artery disease) and alternatives of a cardiac catheterization were discussed in detail with Mr. Bob and he is willing to proceed.   Hyperlipidemia-02/27/2020: Cholesterol, Total 213; HDL 36; LDL Chol Calc (NIH) 146; Triglycerides 172 Continue rosuvastatin, omega-3 Heart healthy low-sodium diet-salty 6 given Increase physical activity as tolerated  Disposition: Follow-up with Dr. Ellyn Hack after cardiac catheterization.  COVID-19 Education: The signs and symptoms of COVID-19 were discussed with the patient and how to seek care for testing (follow up with PCP or arrange E-visit).  The importance of social distancing was discussed today.  Patient Risk:   After full review of this patients  clinical status, I feel that they are at least moderate risk at this time.  Time:   Today, I have spent 12 minutes with the patient with telehealth technology discussing past and current echocardiogram, exercise, risks of cardiac catheterization, blood product consent, and follow-up appointment.     Medication Adjustments/Labs and Tests Ordered: Current medicines are reviewed at length with the patient today.  Concerns regarding medicines are outlined above.   Tests Ordered: No orders of the defined types were placed in this encounter.  Medication Changes: No orders of the defined types were placed in this encounter.   Disposition:  in 1 month(s)  Signed, Jossie Ng. Yadhira Mckneely NP-C    10/30/2018 11:58 AM    [address] Office [phone] Fax [phone]

## 2020-06-17 NOTE — Progress Notes (Signed)
Virtual Visit via Telephone Note   This visit type was conducted due to national recommendations for restrictions regarding the COVID-19 Pandemic (e.g. social distancing) in an effort to limit this patient's exposure and mitigate transmission in our community.  Due to his co-morbid illnesses, this patient is at least at moderate risk for complications without adequate follow up.  This format is felt to be most appropriate for this patient at this time.  The patient did not have access to video technology/had technical difficulties with video requiring transitioning to audio format only (telephone).  All issues noted in this document were discussed and addressed.  No physical exam could be performed with this format.  Please refer to the patient's chart for his  consent to telehealth for [name].  Evaluation Performed:  Follow-up visit  This visit type was conducted due to national recommendations for restrictions regarding the COVID-19 Pandemic (e.g. social distancing).  This format is felt to be most appropriate for this patient at this time.  All issues noted in this document were discussed and addressed.  No physical exam was performed (except for noted visual exam findings with Video Visits).  Please refer to the patient's chart (MyChart message for video visits and phone note for telephone visits) for the patient's consent to telehealth for [name]  Date:  06/19/2020   ID:  [name], DOB Jan 30, 1946, MRN [id number]  Patient Location:  [address] [address]   Provider location:     [address] Office [phone] Fax [phone]   PCP:  [name], MD  Cardiologist:  Glenetta Hew, MD  Electrophysiologist:  None   Chief Complaint: Follow-up visit to review echocardiogram and coronary artery disease/cardiac catheterization recommended  History of Present  Illness:    [name] is a 75 y.o. male who presents via audio/video conferencing for a telehealth visit today.  Patient verified DOB and address.  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  He was seen in follow-up by Dr. Ellyn Hack on 05/20/2020.  At that time he reported he was feeling better with having taken 3 days of furosemide.  His breathing was improved.  He denied lower extremity edema.  He still noted exertional dyspnea but denied chest pain and is breathing had significantly improved.  He reported that despite having symptoms he was still walking daily.  An echocardiogram was ordered.  He underwent echocardiogram 06/16/2020.  It showed severely reduced LVEF at less than 20%.  His ventricle was noted to be dilated and not relaxing well.  This is felt to be the clear reason for his shortness of breath.  No significant valvular abnormalities noted.  Cardiac catheterization was recommended.  He is seen virtually today for follow-up and states he continues to have increased dyspnea with increased exertion.  He has remained physically active doing walks and working in his shop.  We reviewed his echocardiogram and recommendations for cardiac catheterization.  He expressed understanding pertaining to risks of cardiac catheterization and agreed to receive blood products if needed.  I will order a CBC, BMP, and have him follow-up after cardiac catheterization.  He was accompanied by [name] a friend who he gave permission to contact with pertinent medical information.  Today he denies chest pain, increased shortness of  breath, lower extremity edema, fatigue, palpitations, melena, hematuria, hemoptysis, diaphoresis, weakness, presyncope, syncope, orthopnea, and PND.   The patient does not symptoms concerning  for COVID-19 infection (fever, chills, cough, or new SHORTNESS OF BREATH).    Prior CV studies:   The following studies were reviewed today:  Echocardiogram 06/16/2020 IMPRESSIONS    1. Left ventricular ejection fraction, by estimation, is <20%. The left  ventricle has severely decreased function. The left ventricle demonstrates  global hypokinesis. The left ventricular internal cavity size was  moderately to severely dilated. There is  mild left ventricular hypertrophy. Left ventricular diastolic parameters  are consistent with Grade II diastolic dysfunction (pseudonormalization).  Elevated left ventricular end-diastolic pressure.  2. Right ventricular systolic function is mildly reduced. The right  ventricular size is normal.  3. Left atrial size was mildly dilated.  4. The mitral valve is grossly normal. Mild mitral valve regurgitation.  5. The aortic valve is tricuspid. There is mild calcification of the  aortic valve. Aortic valve regurgitation is not visualized. No aortic  stenosis is present.  Past Medical History:  Diagnosis Date  . Arthritis   . BPH (benign prostatic hypertrophy)   . Coronary atherosclerosis of native coronary vessel    CATH 2004--  MINIMAL NONOBSTRUCTIVE LAD/  NORMAL EF/    CARDIAC CT  03/2008  NO SIGNIFICANT DISEASE  AND  nlef  . Elevated PSA   . Eye problem 03/06/2016   [name] retinal detachment left eye, no sx done  . Headache   . History of gout    pt 05-20-2013 states stable   . History of melanoma excision    2011-- LEFT UPPER BACK  . Hyperlipemia   . Hypertriglyceridemia   . Left bundle branch block    08/2017  . Melanoma ([facility]) 2011   back  . Nonischemic cardiomyopathy ([facility])    09-04-2009  --  2D echo-EF 40-45%, Global Hypokinesis - 09/2017 -> EF 45-50%   . Numbness    hands  . Personal history of arthritis   . Personal history of other diseases of circulatory system    Past Surgical History:  Procedure Laterality Date  .  ANTERIOR CERVICAL DECOMP/DISCECTOMY FUSION N/A 04/20/2018   Procedure: Anterior Cervical Decompression Fusion - Cervical Three-Cervical Four - Cervical Four-Cervical Five - Cervical Five-Cervical Six;  Surgeon: Eustace Moore, MD;  Location: [facility];  Service: Neurosurgery;  Laterality: N/A;  Anterior Cervical Decompression Fusion - Cervical Three-Cervical Four - Cervical Four-Cervical Five - Cervical Five-Cervical Six  . APPENDECTOMY  AS CHILD  . BACK SURGERY    . CARDIAC CATHETERIZATION  05/23/2002  &  05-23-1998   minimal irregularities in the LAD/  EF 50%  -   DR AL LITTLE  . CARDIOVASCULAR STRESS TEST  10-05-1998   MILD GLOBAL HYPOKINESIS AND ISCHEMIAIN ANTEROSEPTAL  AT [facility]/ EF 42%  . CARPAL TUNNEL RELEASE Right   . EXCISION MELANOMA LEFT UPPER BACK  10-14-2009  . eye lid surgery Bilateral 2018  . KNEE ARTHROSCOPY WITH SUBCHONDROPLASTY Left 04/07/2017   Procedure: LEFT KNEE ARTHROSCOPY WITH PARTIAL MEDIAL MENISCECTOMY AND MEDIAL TIBIAL SUBCHONDROPLASTY;  Surgeon: Mcarthur Rossetti, MD;  Location: [facility];  Service: Orthopedics;  Laterality: Left;  . knee injections Bilateral   . LUMBAR DISC SURGERY  09-12-2000   left  L5 -- S1  . PROSTATE BIOPSY N/A 05/22/2013   Procedure: BIOPSY TRANSRECTAL ULTRASONIC PROSTATE (TUBP);  Surgeon: [name], MD;  [facility]: [name];  Service: Urology;  Laterality: N/A;  . ROTATOR CUFF REPAIR Right 2016  . THROAT SURGERY  04/20/2018  . TRANSRECTAL ULTRASOUND PROSTATE BX  05-23-2005  &  04-19-2001  . TRANSTHORACIC ECHOCARDIOGRAM  09/2017   EF 45-50 % (previously reported as 40 to 45%).  Incoordinate septal motion with mild LVH.  GR 1 DD.  Aortic sclerosis but no stenosis.     Current Meds  Medication Sig  . aspirin EC 81 MG tablet Take 1 tablet (81 mg total) by mouth daily.  . furosemide (LASIX) 40 MG tablet Take  40 mg  twice a week , may take an additional 40 mg if wieight is greater than 3 lbs and  Or increase shortness of  breath ,or swelling  . gabapentin (NEURONTIN) 300 MG capsule TAKE 1 TO 2 CAPSULES BY  MOUTH 3 TIMES DAILY AS  NEEDED FOR PAIN.  Marland Kitchen Garlic 5093 MG CAPS Take 1,000 mg by mouth daily.  . magnesium oxide (MAG-OX) 400 MG tablet Take 400 mg by mouth daily.  . Multiple Vitamin (MULTIVITAMIN WITH MINERALS) TABS tablet Take 1 tablet by mouth daily. One-A-Day Multivitamin  . Omega-3 Fatty Acids (FISH OIL) 500 MG CAPS Take 500 mg by mouth daily.  . rosuvastatin (CRESTOR) 10 MG tablet Take 1 tablet (10 mg total) by mouth daily.  . sildenafil (VIAGRA) 100 MG tablet 1/2 to 1 tablet up to each day as needed.  . tamsulosin (FLOMAX) 0.4 MG CAPS capsule Take 0.4 mg by mouth daily.  . traMADol (ULTRAM) 50 MG tablet TAKE 1 TABLET (50 MG TOTAL) BY MOUTH EVERY 8 (EIGHT) HOURS AS NEEDED FOR UP TO 5 DAYS.  . Turmeric 500 MG TABS Take 500 mg by mouth daily.  . vitamin B-12 (CYANOCOBALAMIN) 500 MCG tablet Take 500 mcg by mouth daily.  . Zinc 50 MG TABS Take 50 mg by mouth daily.     Allergies:   Nitroglycerin, Crestor [rosuvastatin], Ace inhibitors, Lovastatin, and Solarcaine [benzocaine]   Social History   Tobacco Use  . Smoking status: Former Smoker    Packs/day: 2.00    Years: 20.00    Pack years: 40.00    Types: Cigarettes    Quit date: 03/28/1980    Years since quitting: 40.2  . Smokeless tobacco: Never Used  Vaping Use  . Vaping Use: Never used  Substance Use Topics  . Alcohol use: No    Alcohol/week: 0.0 standard drinks  . Drug use: No     Family Hx: The patient's family history includes Arthritis in his mother; COPD in his father; Cancer in his father and sister; Cardiomyopathy in an other family member. There is no history of Colon cancer.  ROS:   Please see the history of present illness.     All other systems reviewed and are negative.   Labs/Other Tests and Data Reviewed:    Recent Labs: 01/08/2020: ALT 20; BUN 24; Creatinine, Ser 1.02; Potassium 4.6; Sodium 140 05/17/2020: B  Natriuretic Peptide 359.2   Recent Lipid Panel Lab Results  Component Value Date/Time   CHOL 213 (H) 02/27/2020 11:20 AM   TRIG 172 (H) 02/27/2020 11:20 AM   HDL 36 (L) 02/27/2020 11:20 AM   CHOLHDL 5.9 (H) 02/27/2020 11:20 AM   CHOLHDL 6.5 (H) 10/26/2015 11:14 AM   LDLCALC 146 (H) 02/27/2020 11:20 AM    Wt Readings from Last 3 Encounters:  06/19/20 191 lb (86.6 kg)  06/09/20 196 lb (88.9 kg)  05/21/20 201 lb (91.2 kg)     Exam:  Vital Signs:  BP 122/62   Pulse 96   Ht 5\' 10"  (1.778 m)   Wt 191 lb (86.6 kg)   BMI 27.41 kg/m    Well nourished, well developed male in no  acute distress.   ASSESSMENT & PLAN:    1.  Nonischemic cardiomyopathy/acute on chronic CHF with reduced EF/DOE -echocardiogram showed EF less than 20%.  Denies chest pain.  Continues with dyspnea with exertion. Continue aspirin, furosemide, rosuvastatin, magnesium, sildenafil Cardiac catheterization reviewed Order cardiac catheterization Order CBC/BMP  Shared Decision Making/Informed Consent The risks [stroke (1 in 1000), death (1 in 1000), kidney failure [usually temporary] (1 in 500), bleeding (1 in 200), allergic reaction [possibly serious] (1 in 200)], benefits (diagnostic support and management of coronary artery disease) and alternatives of a cardiac catheterization were discussed in detail with Mr. Bones and he is willing to proceed.   Hyperlipidemia-02/27/2020: Cholesterol, Total 213; HDL 36; LDL Chol Calc (NIH) 146; Triglycerides 172 Continue rosuvastatin, omega-3 Heart healthy low-sodium diet-salty 6 given Increase physical activity as tolerated  Disposition: Follow-up with Dr. Ellyn Hack after cardiac catheterization.  COVID-19 Education: The signs and symptoms of COVID-19 were discussed with the patient and how to seek care for testing (follow up with PCP or arrange E-visit).  The importance of social distancing was discussed today.  Patient Risk:   After full review of this patients  clinical status, I feel that they are at least moderate risk at this time.  Time:   Today, I have spent 12 minutes with the patient with telehealth technology discussing past and current echocardiogram, exercise, risks of cardiac catheterization, blood product consent, and follow-up appointment.     Medication Adjustments/Labs and Tests Ordered: Current medicines are reviewed at length with the patient today.  Concerns regarding medicines are outlined above.   Tests Ordered: No orders of the defined types were placed in this encounter.  Medication Changes: No orders of the defined types were placed in this encounter.   Disposition:  in 1 month(s)  Signed, Jossie Ng. Latania Bascomb NP-C    10/30/2018 11:58 AM    [place] [address] Office [phone] Fax [phone]

## 2020-06-18 ENCOUNTER — Other Ambulatory Visit: Payer: Self-pay | Admitting: Student

## 2020-06-18 ENCOUNTER — Other Ambulatory Visit: Payer: Self-pay | Admitting: Family Medicine

## 2020-06-18 DIAGNOSIS — M4802 Spinal stenosis, cervical region: Secondary | ICD-10-CM

## 2020-06-18 NOTE — Telephone Encounter (Signed)
Requested medication (s) are due for refill today: yes  Requested medication (s) are on the active medication list: no  Last refill:  06/11/20  Future visit scheduled: yes  Notes to clinic:  not delegated    Requested Prescriptions  Pending Prescriptions Disp Refills   traMADol (ULTRAM) 50 MG tablet [Pharmacy Med Name: TRAMADOL HCL 50 MG TABLET] 15 tablet 0    Sig: Take 1 tablet (50 mg total) by mouth every 8 (eight) hours as needed for up to 5 days.      Not Delegated - Analgesics:  Opioid Agonists Failed - 06/18/2020 11:37 AM      Failed - This refill cannot be delegated      Failed - Urine Drug Screen completed in last 360 days      Passed - Valid encounter within last 6 months    Recent Outpatient Visits           4 weeks ago DOE (dyspnea on exertion)   [facility], Ranell Patrick, MD   1 month ago SOB (shortness of breath)   [facility], Ranell Patrick, MD   3 months ago Hypertriglyceridemia   [facility], Ranell Patrick, MD   5 months ago Hyperlipidemia, unspecified hyperlipidemia type   [facility], Ranell Patrick, MD   10 months ago Hyperlipidemia, unspecified hyperlipidemia type   [facility], Ranell Patrick, MD       Future Appointments             Tomorrow Cleaver, Jossie Ng, NP [facility] [place], [facility]   In 1 month Ellyn Hack, Leonie Green, MD [place], [place]   In 2 months Carlota Raspberry, Ranell Patrick, MD [facility] [place], [facility]

## 2020-06-18 NOTE — Telephone Encounter (Signed)
Refill granted of ultram but needs to be evaluated if further refills needed.

## 2020-06-18 NOTE — Telephone Encounter (Signed)
Patient is requesting a refill of the following medications: Requested Prescriptions   Pending Prescriptions Disp Refills   traMADol (ULTRAM) 50 MG tablet [Pharmacy Med Name: TRAMADOL HCL 50 MG TABLET] 15 tablet 0    Sig: Take 1 tablet (50 mg total) by mouth every 8 (eight) hours as needed for up to 5 days.    Date of patient request: 06/18/20 Last office visit: 08/28/19 Date of last refill: 06/11/20 Last refill amount: 15 Follow up time period per chart:

## 2020-06-19 ENCOUNTER — Encounter: Payer: Self-pay | Admitting: General Practice

## 2020-06-19 ENCOUNTER — Telehealth (INDEPENDENT_AMBULATORY_CARE_PROVIDER_SITE_OTHER): Payer: Medicare HMO | Admitting: General Practice

## 2020-06-19 VITALS — BP 122/62 | HR 96 | Ht 70.0 in | Wt 191.0 lb

## 2020-06-19 DIAGNOSIS — I428 Other cardiomyopathies: Secondary | ICD-10-CM | POA: Diagnosis not present

## 2020-06-19 DIAGNOSIS — E785 Hyperlipidemia, unspecified: Secondary | ICD-10-CM

## 2020-06-19 DIAGNOSIS — I5023 Acute on chronic systolic (congestive) heart failure: Secondary | ICD-10-CM

## 2020-06-19 MED ORDER — SODIUM CHLORIDE 0.9% FLUSH: 3.0000 mL | Freq: Two times a day (BID) | INTRAVENOUS | Status: DC

## 2020-06-19 NOTE — Patient Instructions (Signed)
Medication Instructions:  Your physician recommends that you continue on your current medications as directed. Please refer to the Current Medication list given to you today.  *If you need a refill on your cardiac medications before your next appointment, please call your pharmacy*  Follow-Up: At [facility], you and your health needs are our priority.  As part of our continuing mission to provide you with exceptional heart care, we have created designated Provider Care Teams.  These Care Teams include your primary Cardiologist (physician) and Advanced Practice Providers (APPs -  Physician Assistants and Nurse Practitioners) who all work together to provide you with the care you need, when you need it.   Your next appointment:   07/22/2020 @ 8:15 with Coletta Memos NP  Other Instructions  Due to recent COVID-19 restrictions implemented by our local and state authorities and in an effort to keep both patients and staff as safe as possible, our hospital system requires COVID-19 testing prior to certain scheduled hospital procedures.  Please go to [place]. [place], [place] [address] on Tuesday 06/23/2020 at 8:55am  .  This is a drive up testing site.  You will not need to exit your vehicle.  You will not be billed at the time of testing but may receive a bill later depending on your insurance. You must agree to self-quarantine from the time of your testing until the procedure date on 06/25/2020.  This should included staying home with ONLY the people you live with.  Avoid take-out, grocery store shopping or leaving the house for any non-emergent reason.  Failure to have your COVID-19 test done on the date and time you have been scheduled will result in cancellation of your procedure.  Please call our office at [phone] if you have any questions.     [place] [facility] [address] [place]  [address] Dept: [phone] Loc: [place]  06/19/2020  You are scheduled for a Cardiac Catheterization on Thursday, March 31 with Dr. Daneen Schick.  1. Please arrive at the [facility] (Main Entrance A) at [facility]: [address] [place], [place] [address] at 8:30 AM (This time is two hours before your procedure to ensure your preparation). Free valet parking service is available.   Special note: Every effort is made to have your procedure done on time. Please understand that emergencies sometimes delay scheduled procedures.  2. Diet: Do not eat solid foods after midnight.  The patient may have clear liquids until 5am upon the day of the procedure.  3. Labs: You will need to have blood drawn on Tuesday, March 29 at [place], [place]  Open: [place] (Lunch 12:30 - 1:30)   Phone: [phone]. You do not need to be fasting.  4. Medication instructions in preparation for your procedure:   Contrast Allergy: No  HOLD FUROSEMIDE THE MORNING OF PROCEDURE  On the morning of your procedure, take your Aspirin and any morning medicines NOT listed above.  You may use sips of water.  5. Plan for one night stay--bring personal belongings. 6. Bring a current list of your medications and current insurance cards. 7. You MUST have a responsible person to drive you home. 8. Someone MUST be with you the first 24 hours after you arrive home or your discharge will be delayed. 9. Please wear clothes that are easy to get on and off and wear  slip-on shoes.  Thank you for allowing [place] to care for you!   -- [facility]

## 2020-06-22 ENCOUNTER — Ambulatory Visit: Payer: Medicare HMO

## 2020-06-23 ENCOUNTER — Other Ambulatory Visit: Payer: Self-pay

## 2020-06-23 ENCOUNTER — Ambulatory Visit (INDEPENDENT_AMBULATORY_CARE_PROVIDER_SITE_OTHER): Payer: Medicare HMO

## 2020-06-23 ENCOUNTER — Other Ambulatory Visit (HOSPITAL_COMMUNITY)
Admission: RE | Admit: 2020-06-23 | Discharge: 2020-06-23 | Disposition: A | Discharge status: Home / Self Care | Payer: Medicare HMO | Source: Ambulatory Visit | Attending: Interventional Cardiology | Admitting: Interventional Cardiology

## 2020-06-23 VITALS — BP 110/68 | HR 74 | Wt 193.0 lb

## 2020-06-23 DIAGNOSIS — I428 Other cardiomyopathies: Secondary | ICD-10-CM

## 2020-06-23 DIAGNOSIS — Z20822 Contact with and (suspected) exposure to covid-19: Secondary | ICD-10-CM | POA: Insufficient documentation

## 2020-06-23 DIAGNOSIS — I5023 Acute on chronic systolic (congestive) heart failure: Secondary | ICD-10-CM

## 2020-06-23 DIAGNOSIS — Z01812 Encounter for preprocedural laboratory examination: Secondary | ICD-10-CM | POA: Diagnosis not present

## 2020-06-23 DIAGNOSIS — E785 Hyperlipidemia, unspecified: Secondary | ICD-10-CM

## 2020-06-23 LAB — CBC
Hematocrit: 38.6 % (ref 37.5–51.0)
Hemoglobin: 12.7 g/dL — ABNORMAL LOW (ref 13.0–17.7)
MCH: 28.6 pg (ref 26.6–33.0)
MCHC: 32.9 g/dL (ref 31.5–35.7)
MCV: 87 fL (ref 79–97)
Platelets: 154 10*3/uL (ref 150–450)
RBC: 4.44 x10E6/uL (ref 4.14–5.80)
RDW: 13.1 % (ref 11.6–15.4)
WBC: 9 10*3/uL (ref 3.4–10.8)

## 2020-06-23 LAB — BASIC METABOLIC PANEL
BUN/Creatinine Ratio: 19 (ref 10–24)
BUN: 24 mg/dL (ref 8–27)
CO2: 23 mmol/L (ref 20–29)
Calcium: 9.7 mg/dL (ref 8.6–10.2)
Chloride: 102 mmol/L (ref 96–106)
Creatinine, Ser: 1.28 mg/dL — ABNORMAL HIGH (ref 0.76–1.27)
Glucose: 101 mg/dL — ABNORMAL HIGH (ref 65–99)
Potassium: 4.6 mmol/L (ref 3.5–5.2)
Sodium: 138 mmol/L (ref 134–144)
eGFR: 58 mL/min/{1.73_m2} — ABNORMAL LOW (ref >59)

## 2020-06-23 LAB — SARS CORONAVIRUS 2 (TAT 6-24 HRS): SARS Coronavirus 2: NEGATIVE

## 2020-06-23 NOTE — Progress Notes (Signed)
Pt is here for EKG for his upcoming CATH on 06/25/20. EKG done and sent to be scanned into chart.  Coletta Memos, FNP reviewed EKG and ok to release pt to lab and then ok to go home. Pt taken to lab.

## 2020-06-24 ENCOUNTER — Other Ambulatory Visit: Payer: Self-pay | Admitting: Family Medicine

## 2020-06-24 ENCOUNTER — Telehealth: Payer: Self-pay | Admitting: *Deleted

## 2020-06-24 DIAGNOSIS — G6289 Other specified polyneuropathies: Secondary | ICD-10-CM

## 2020-06-24 NOTE — Addendum Note (Signed)
Addended by: Waylan Rocher on: 06/24/2020 08:30 AM   Modules accepted: Orders

## 2020-06-24 NOTE — Telephone Encounter (Signed)
Duplicate encounter; error

## 2020-06-24 NOTE — Telephone Encounter (Signed)
Pt contacted pre-catheterization scheduled at [facility] for: Thursday June 25, 2020 10:30 AM Verified arrival time and place: [facility] [place] [facility]) at: 8:30 AM   No solid food after midnight prior to cath, clear liquids until 5 AM day of procedure.   Hold:  Lasix-day before and day of procedure-GFR 58-pt had already taken today  Avoid ED medications prior to procedure.  Except hold medications AM meds can be  taken pre-cath with sips of water including: ASA 81 mg   Confirmed patient has responsible adult to drive home post procedure and be with patient first 24 hours after arriving home: yes  You are allowed ONE visitor in the waiting room during the time you are at the hospital for your procedure. Both you and your visitor must wear a mask once you enter the hospital.    Reviewed procedure/mask/visitor instructions with patient.  Pt encouraged to slightly increase oral intake/hydrate today.

## 2020-06-25 ENCOUNTER — Other Ambulatory Visit: Payer: Self-pay

## 2020-06-25 ENCOUNTER — Ambulatory Visit (HOSPITAL_COMMUNITY)
Admission: RE | Admit: 2020-06-25 | Discharge: 2020-06-25 | Disposition: A | Discharge status: Home / Self Care | Payer: Medicare HMO | Attending: Interventional Cardiology | Admitting: Interventional Cardiology

## 2020-06-25 ENCOUNTER — Encounter (HOSPITAL_COMMUNITY): Payer: Self-pay | Admitting: Interventional Cardiology

## 2020-06-25 ENCOUNTER — Encounter (HOSPITAL_COMMUNITY)
Admission: RE | Disposition: A | Discharge status: Home / Self Care | Payer: Self-pay | Source: Home / Self Care | Attending: Interventional Cardiology

## 2020-06-25 DIAGNOSIS — Z87891 Personal history of nicotine dependence: Secondary | ICD-10-CM | POA: Diagnosis not present

## 2020-06-25 DIAGNOSIS — Z79899 Other long term (current) drug therapy: Secondary | ICD-10-CM | POA: Insufficient documentation

## 2020-06-25 DIAGNOSIS — I428 Other cardiomyopathies: Secondary | ICD-10-CM | POA: Insufficient documentation

## 2020-06-25 DIAGNOSIS — Z888 Allergy status to other drugs, medicaments and biological substances status: Secondary | ICD-10-CM | POA: Diagnosis not present

## 2020-06-25 DIAGNOSIS — E785 Hyperlipidemia, unspecified: Secondary | ICD-10-CM | POA: Insufficient documentation

## 2020-06-25 DIAGNOSIS — I5023 Acute on chronic systolic (congestive) heart failure: Secondary | ICD-10-CM | POA: Insufficient documentation

## 2020-06-25 DIAGNOSIS — Z7982 Long term (current) use of aspirin: Secondary | ICD-10-CM | POA: Insufficient documentation

## 2020-06-25 DIAGNOSIS — I447 Left bundle-branch block, unspecified: Secondary | ICD-10-CM | POA: Diagnosis present

## 2020-06-25 DIAGNOSIS — I5022 Chronic systolic (congestive) heart failure: Secondary | ICD-10-CM | POA: Diagnosis present

## 2020-06-25 HISTORY — PX: RIGHT/LEFT HEART CATH AND CORONARY ANGIOGRAPHY: CATH118266

## 2020-06-25 LAB — POCT I-STAT 7, (LYTES, BLD GAS, ICA,H+H)
Acid-base deficit: 2 mmol/L (ref 0.0–2.0)
Bicarbonate: 24 mmol/L (ref 20.0–28.0)
Calcium, Ion: 1.26 mmol/L (ref 1.15–1.40)
HCT: 35 % — ABNORMAL LOW (ref 39.0–52.0)
Hemoglobin: 11.9 g/dL — ABNORMAL LOW (ref 13.0–17.0)
O2 Saturation: 99 %
Potassium: 4.1 mmol/L (ref 3.5–5.1)
Sodium: 142 mmol/L (ref 135–145)
TCO2: 25 mmol/L (ref 22–32)
pCO2 arterial: 45.1 mmHg (ref 32.0–48.0)
pH, Arterial: 7.333 — ABNORMAL LOW (ref 7.350–7.450)
pO2, Arterial: 128 mmHg — ABNORMAL HIGH (ref 83.0–108.0)

## 2020-06-25 LAB — POCT I-STAT EG7
Acid-Base Excess: 0 mmol/L (ref 0.0–2.0)
Acid-base deficit: 1 mmol/L (ref 0.0–2.0)
Bicarbonate: 26 mmol/L (ref 20.0–28.0)
Bicarbonate: 26.6 mmol/L (ref 20.0–28.0)
Calcium, Ion: 1.3 mmol/L (ref 1.15–1.40)
Calcium, Ion: 1.3 mmol/L (ref 1.15–1.40)
HCT: 36 % — ABNORMAL LOW (ref 39.0–52.0)
HCT: 36 % — ABNORMAL LOW (ref 39.0–52.0)
Hemoglobin: 12.2 g/dL — ABNORMAL LOW (ref 13.0–17.0)
Hemoglobin: 12.2 g/dL — ABNORMAL LOW (ref 13.0–17.0)
O2 Saturation: 74 %
O2 Saturation: 74 %
Potassium: 4.1 mmol/L (ref 3.5–5.1)
Potassium: 4.1 mmol/L (ref 3.5–5.1)
Sodium: 142 mmol/L (ref 135–145)
Sodium: 142 mmol/L (ref 135–145)
TCO2: 28 mmol/L (ref 22–32)
TCO2: 28 mmol/L (ref 22–32)
pCO2, Ven: 51.5 mmHg (ref 44.0–60.0)
pCO2, Ven: 53.4 mmHg (ref 44.0–60.0)
pH, Ven: 7.305 (ref 7.250–7.430)
pH, Ven: 7.311 (ref 7.250–7.430)
pO2, Ven: 43 mmHg (ref 32.0–45.0)
pO2, Ven: 44 mmHg (ref 32.0–45.0)

## 2020-06-25 SURGERY — RIGHT/LEFT HEART CATH AND CORONARY ANGIOGRAPHY
Anesthesia: LOCAL

## 2020-06-25 MED ORDER — ASPIRIN 81 MG PO CHEW: 81.0000 mg | CHEWABLE_TABLET | ORAL | Status: DC

## 2020-06-25 MED ORDER — LIDOCAINE HCL (PF) 1 % IJ SOLN
INTRAMUSCULAR | Status: DC | PRN
  Administered 2020-06-25: 2 mL via INTRADERMAL
  Administered 2020-06-25: 9 mL via INTRADERMAL

## 2020-06-25 MED ORDER — ONDANSETRON HCL 4 MG/2ML IJ SOLN: 4.0000 mg | Freq: Four times a day (QID) | INTRAMUSCULAR | Status: DC | PRN

## 2020-06-25 MED ORDER — FENTANYL CITRATE (PF) 100 MCG/2ML IJ SOLN
INTRAMUSCULAR | Status: AC
  Filled 2020-06-25: qty 2

## 2020-06-25 MED ORDER — SODIUM CHLORIDE 0.9 % IV SOLN: 250.0000 mL | INTRAVENOUS | Status: DC | PRN

## 2020-06-25 MED ORDER — SODIUM CHLORIDE 0.9% FLUSH: 3.0000 mL | Freq: Two times a day (BID) | INTRAVENOUS | Status: DC

## 2020-06-25 MED ORDER — HEPARIN (PORCINE) IN NACL 1000-0.9 UT/500ML-% IV SOLN
INTRAVENOUS | Status: AC
  Filled 2020-06-25: qty 1000

## 2020-06-25 MED ORDER — SODIUM CHLORIDE 0.9 % IV SOLN: INTRAVENOUS | Status: DC

## 2020-06-25 MED ORDER — SODIUM CHLORIDE 0.9% FLUSH: 3.0000 mL | INTRAVENOUS | Status: DC | PRN

## 2020-06-25 MED ORDER — SODIUM CHLORIDE 0.9 % WEIGHT BASED INFUSION: 1.0000 mL/kg/h | INTRAVENOUS | Status: DC

## 2020-06-25 MED ORDER — HEPARIN SODIUM (PORCINE) 1000 UNIT/ML IJ SOLN
INTRAMUSCULAR | Status: DC | PRN
  Administered 2020-06-25: 4500 [IU] via INTRAVENOUS

## 2020-06-25 MED ORDER — LABETALOL HCL 5 MG/ML IV SOLN: 10.0000 mg | INTRAVENOUS | Status: DC | PRN

## 2020-06-25 MED ORDER — SODIUM CHLORIDE 0.9 % WEIGHT BASED INFUSION
3.0000 mL/kg/h | INTRAVENOUS | Status: AC
  Administered 2020-06-25: 3 mL/kg/h via INTRAVENOUS

## 2020-06-25 MED ORDER — ACETAMINOPHEN 325 MG PO TABS: 650.0000 mg | ORAL_TABLET | ORAL | Status: DC | PRN

## 2020-06-25 MED ORDER — VERAPAMIL HCL 2.5 MG/ML IV SOLN
INTRAVENOUS | Status: DC | PRN
  Administered 2020-06-25: 10 mL via INTRA_ARTERIAL

## 2020-06-25 MED ORDER — FENTANYL CITRATE (PF) 100 MCG/2ML IJ SOLN
INTRAMUSCULAR | Status: DC | PRN
  Administered 2020-06-25: 25 ug via INTRAVENOUS

## 2020-06-25 MED ORDER — MIDAZOLAM HCL 2 MG/2ML IJ SOLN
INTRAMUSCULAR | Status: DC | PRN
  Administered 2020-06-25: 2 mg via INTRAVENOUS

## 2020-06-25 MED ORDER — MIDAZOLAM HCL 2 MG/2ML IJ SOLN
INTRAMUSCULAR | Status: AC
  Filled 2020-06-25: qty 2

## 2020-06-25 MED ORDER — LIDOCAINE HCL (PF) 1 % IJ SOLN
INTRAMUSCULAR | Status: AC
  Filled 2020-06-25: qty 30

## 2020-06-25 MED ORDER — VERAPAMIL HCL 2.5 MG/ML IV SOLN
INTRAVENOUS | Status: AC
  Filled 2020-06-25: qty 2

## 2020-06-25 MED ORDER — HEPARIN SODIUM (PORCINE) 1000 UNIT/ML IJ SOLN
INTRAMUSCULAR | Status: AC
  Filled 2020-06-25: qty 1

## 2020-06-25 MED ORDER — HYDRALAZINE HCL 20 MG/ML IJ SOLN: 10.0000 mg | INTRAMUSCULAR | Status: DC | PRN

## 2020-06-25 MED ORDER — HEPARIN (PORCINE) IN NACL 1000-0.9 UT/500ML-% IV SOLN
INTRAVENOUS | Status: DC | PRN
  Administered 2020-06-25 (×2): 500 mL

## 2020-06-25 MED ORDER — OXYCODONE HCL 5 MG PO TABS: 5.0000 mg | ORAL_TABLET | ORAL | Status: DC | PRN

## 2020-06-25 SURGICAL SUPPLY — 12 items
CATH 5FR JL3.5 JR4 ANG PIG MP (CATHETERS) ×2 IMPLANT
CATH SWAN GANZ 7F STRAIGHT (CATHETERS) ×2 IMPLANT
DEVICE RAD COMP TR BAND LRG (VASCULAR PRODUCTS) ×2 IMPLANT
GLIDESHEATH SLEND A-KIT 6F 22G (SHEATH) ×2 IMPLANT
GLIDESHEATH SLENDER 7FR .021G (SHEATH) ×2 IMPLANT
GUIDEWIRE INQWIRE 1.5J.035X260 (WIRE) ×1 IMPLANT
INQWIRE 1.5J .035X260CM (WIRE) ×2
KIT HEART LEFT (KITS) ×2 IMPLANT
PACK CARDIAC CATHETERIZATION (CUSTOM PROCEDURE TRAY) ×2 IMPLANT
SHEATH PROBE COVER 6X72 (BAG) ×2 IMPLANT
TRANSDUCER W/STOPCOCK (MISCELLANEOUS) ×2 IMPLANT
TUBING CIL FLEX 10 FLL-RA (TUBING) ×2 IMPLANT

## 2020-06-25 NOTE — Interval H&P Note (Signed)
Cath Lab Visit (complete for each Cath Lab visit)  Clinical Evaluation Leading to the Procedure:   ACS: No.  Non-ACS:    Anginal Classification: CCS III  Anti-ischemic medical therapy: Minimal Therapy (1 class of medications)  Non-Invasive Test Results: No non-invasive testing performed  Prior CABG: No previous CABG      History and Physical Interval Note:  06/25/2020 12:47 PM  [name]  has presented today for surgery, with the diagnosis of heart failure.  The various methods of treatment have been discussed with the patient and family. After consideration of risks, benefits and other options for treatment, the patient has consented to  Procedure(s): LEFT HEART CATH AND CORONARY ANGIOGRAPHY (N/A) as a surgical intervention.  The patient's history has been reviewed, patient examined, no change in status, stable for surgery.  I have reviewed the patient's chart and labs.  Questions were answered to the patient's satisfaction.     Belva Crome III

## 2020-06-25 NOTE — Progress Notes (Signed)
Took over care of patient from [place], [place]

## 2020-06-25 NOTE — CV Procedure (Signed)
   Normal right heart pressures  Normal coronary arteries  LVEF less than 20%.  LVEDP 23 mmHg  Mean PA pressure 24 mmHg.  Mean capillary wedge pressure 19 mmHg.  Cardiac output 6.32 L/min with index 3.8 L/min/m

## 2020-06-25 NOTE — Progress Notes (Signed)
Discharge instructions reviewed with pt and his girlfriend both voice understanding.

## 2020-06-25 NOTE — Discharge Instructions (Signed)
Radial Site Care  This sheet gives you information about how to care for yourself after your procedure. Your health care provider may also give you more specific instructions. If you have problems or questions, contact your health care provider. What can I expect after the procedure? After the procedure, it is common to have:  Bruising and tenderness at the catheter insertion area. Follow these instructions at home: Medicines  Take over-the-counter and prescription medicines only as told by your health care provider. Insertion site care 1. Follow instructions from your health care provider about how to take care of your insertion site. Make sure you: ? Wash your hands with soap and water before you remove your bandage (dressing). If soap and water are not available, use hand sanitizer. ? May remove dressing in 24 hours. 2. Check your insertion site every day for signs of infection. Check for: ? Redness, swelling, or pain. ? Fluid or blood. ? Pus or a bad smell. ? Warmth. 3. Do no take baths, swim, or use a hot tub for 5 days. 4. You may shower 24-48 hours after the procedure. ? Remove the dressing and gently wash the site with plain soap and water. ? Pat the area dry with a clean towel. ? Do not rub the site. That could cause bleeding. 5. Do not apply powder or lotion to the site. Activity  1. For 24 hours after the procedure, or as directed by your health care provider: ? Do not flex or bend the affected arm. ? Do not push or pull heavy objects with the affected arm. ? Do not drive yourself home from the hospital or clinic. You may drive 24 hours after the procedure. ? Do not operate machinery or power tools. ? KEEP ARM ELEVATED THE REMAINDER OF THE DAY. 2. Do not push, pull or lift anything that is heavier than 10 lb for 5 days. 3. Ask your health care provider when it is okay to: ? Return to work or school. ? Resume usual physical activities or sports. ? Resume sexual  activity. General instructions  If the catheter site starts to bleed, raise your arm and put firm pressure on the site. If the bleeding does not stop, get help right away. This is a medical emergency.  DRINK PLENTY OF FLUIDS FOR THE NEXT 2-3 DAYS.  No alcohol consumption for 24 hours after receiving sedation.  If you went home on the same day as your procedure, a responsible adult should be with you for the first 24 hours after you arrive home.  Keep all follow-up visits as told by your health care provider. This is important. Contact a health care provider if:  You have a fever.  You have redness, swelling, or yellow drainage around your insertion site. Get help right away if:  You have unusual pain at the radial site.  The catheter insertion area swells very fast.  The insertion area is bleeding, and the bleeding does not stop when you hold steady pressure on the area.  Your arm or hand becomes pale, cool, tingly, or numb. These symptoms may represent a serious problem that is an emergency. Do not wait to see if the symptoms will go away. Get medical help right away. Call your local emergency services (911 in the [place] Do not drive yourself to the hospital. Summary  After the procedure, it is common to have bruising and tenderness at the site.  Follow instructions from your health care provider about how to take care   of your radial site wound. Check the wound every day for signs of infection.  This information is not intended to replace advice given to you by your health care provider. Make sure you discuss any questions you have with your health care provider. Document Revised: 04/19/2017 Document Reviewed: 04/19/2017 Elsevier Patient Education  2020 Elsevier Inc. 

## 2020-06-26 ENCOUNTER — Telehealth: Payer: Self-pay | Admitting: Cardiology

## 2020-06-26 MED ORDER — CARVEDILOL 3.125 MG PO TABS: 3.1250 mg | ORAL_TABLET | Freq: Two times a day (BID) | ORAL | 3 refills | Status: DC

## 2020-06-26 NOTE — Telephone Encounter (Signed)
We do need to start him on new medicines for his heart failure, however we do need to also look through his medications he is got some allergies listed.  I would probably have one to start him on Entresto, but he has lisinopril listed as an allergy.  We need to see what his blood pressures look like in order to know the appropriate medications to start.  This is not something we just do via telephone.  Unfortunately, he was not scheduled for for me to cath, therefore I was not there when he was discharged.  There are several different options of medications that we want to start, but we need to know what his blood pressures look like and what his symptoms are.   I am concerned about the listed allergy for ACE inhibitor need to make sure there is not any cross-reactivity with Entresto.  We would also probably want to start him on some like [place] or Jardiance, but that requires a cost analysis.  The one thing we can probably start him on is carvedilol 3.125 mg twice daily.  If this can be started prior to him being seen, then we can assess how he is doing.  He will continue taking the Lasix as represcribed.   Rx: Carvedilol 3.25 mg twice daily.  Dispense, 60 tabs, 3 refills.   Glenetta Hew, MD

## 2020-06-26 NOTE — Telephone Encounter (Signed)
Patient's fiance returning call.

## 2020-06-26 NOTE — Telephone Encounter (Signed)
Called patient to discuss moving appointment- nothing is available at this time, but did notify patient to call back to discuss why his appointment was needing to be moved.   Left call back number.

## 2020-06-26 NOTE — Telephone Encounter (Signed)
Pt and friend [name] (DPR) informed of providers result & recommendations, verbalized understanding. Pt kwill take and log her BP and bring log to appt for provider review. New RX sent

## 2020-06-26 NOTE — Telephone Encounter (Signed)
Patient's fiance requesting a call back to the patient at: [phone]. They would like his appointment moved up. There was an opening for next week, but the patient has a neck injection that has been rescheduled twice already. She states if the patient cannot be reached to call her: [phone]

## 2020-06-26 NOTE — Telephone Encounter (Signed)
Called and s/w [name] CAMBLE(FRIEND) (DPR) she states that when she was talking to Dr Tamala Julian after the cath procedure he said that he was going to send a message to Dr Ellyn Hack and pt should be starting a new medication. Pt is very anxious and wants to start new medication today and would like a sooner appointment. Explained to "[name]" that we currently have no sooner appts and I would send message to [facility] and CB with any message, pt will need to wait for OV to discuss new medication. Pt feels "fine" [name] would like to be called back on her Cell number [phone]

## 2020-06-30 ENCOUNTER — Ambulatory Visit: Payer: Medicare HMO | Admitting: Neurology

## 2020-07-01 DIAGNOSIS — M5481 Occipital neuralgia: Secondary | ICD-10-CM | POA: Diagnosis not present

## 2020-07-01 DIAGNOSIS — Z981 Arthrodesis status: Secondary | ICD-10-CM | POA: Diagnosis not present

## 2020-07-01 DIAGNOSIS — M4322 Fusion of spine, cervical region: Secondary | ICD-10-CM | POA: Diagnosis not present

## 2020-07-01 DIAGNOSIS — M4802 Spinal stenosis, cervical region: Secondary | ICD-10-CM | POA: Diagnosis not present

## 2020-07-06 DIAGNOSIS — R972 Elevated prostate specific antigen [PSA]: Secondary | ICD-10-CM | POA: Diagnosis not present

## 2020-07-18 ENCOUNTER — Other Ambulatory Visit: Payer: Self-pay

## 2020-07-18 ENCOUNTER — Ambulatory Visit
Admission: RE | Admit: 2020-07-18 | Discharge: 2020-07-18 | Disposition: A | Discharge status: Home / Self Care | Payer: Medicare HMO | Source: Ambulatory Visit | Attending: Student | Admitting: Student

## 2020-07-18 DIAGNOSIS — M4802 Spinal stenosis, cervical region: Secondary | ICD-10-CM | POA: Diagnosis not present

## 2020-07-21 NOTE — Progress Notes (Addendum)
Cardiology Clinic Note   Patient Name: Naythen Frary Date of Encounter: 07/22/2020  Primary Care Provider:  [name], MD Primary Cardiologist:  Glenetta Hew, MD  Patient Profile    Jovonta Peche Brandl is a 75 y.o. male who presents status post cardiac catheterization.  Past Medical History    Past Medical History:  Diagnosis Date  . Arthritis   . BPH (benign prostatic hypertrophy)   . Coronary atherosclerosis of native coronary vessel    CATH 2004--  MINIMAL NONOBSTRUCTIVE LAD/  NORMAL EF/    CARDIAC CT  03/2008  NO SIGNIFICANT DISEASE  AND  nlef  . Elevated PSA   . Eye problem 03/06/2016   [name] retinal detachment left eye, no sx done  . Headache   . History of gout    pt 05-20-2013 states stable   . History of melanoma excision    2011-- LEFT UPPER BACK  . Hyperlipemia   . Hypertriglyceridemia   . Left bundle branch block    08/2017  . Melanoma ([place]) 2011   back  . Nonischemic cardiomyopathy ([place])    09-04-2009  --  2D echo-EF 40-45%, Global Hypokinesis - 09/2017 -> EF 45-50%   . Numbness    hands  . Personal history of arthritis   . Personal history of other diseases of circulatory system    Past Surgical History:  Procedure Laterality Date  . ANTERIOR CERVICAL DECOMP/DISCECTOMY FUSION N/A 04/20/2018   Procedure: Anterior Cervical Decompression Fusion - Cervical Three-Cervical Four - Cervical Four-Cervical Five - Cervical Five-Cervical Six;  Surgeon: Eustace Moore, MD;  Location: [facility];  Service: Neurosurgery;  Laterality: N/A;  Anterior Cervical Decompression Fusion - Cervical Three-Cervical Four - Cervical Four-Cervical Five - Cervical Five-Cervical Six  . APPENDECTOMY  AS CHILD  . BACK SURGERY    . CARDIAC CATHETERIZATION  05/23/2002  &  05-23-1998   minimal irregularities in the LAD/  EF 50%  -   DR AL LITTLE  . CARDIOVASCULAR STRESS TEST  10-05-1998   MILD GLOBAL HYPOKINESIS AND ISCHEMIAIN ANTEROSEPTAL  AT [facility]/ EF 42%  . CARPAL TUNNEL  RELEASE Right   . EXCISION MELANOMA LEFT UPPER BACK  10-14-2009  . eye lid surgery Bilateral 2018  . KNEE ARTHROSCOPY WITH SUBCHONDROPLASTY Left 04/07/2017   Procedure: LEFT KNEE ARTHROSCOPY WITH PARTIAL MEDIAL MENISCECTOMY AND MEDIAL TIBIAL SUBCHONDROPLASTY;  Surgeon: Mcarthur Rossetti, MD;  Location: [facility];  Service: Orthopedics;  Laterality: Left;  . knee injections Bilateral   . LUMBAR DISC SURGERY  09-12-2000   left  L5 -- S1  . PROSTATE BIOPSY N/A 05/22/2013   Procedure: BIOPSY TRANSRECTAL ULTRASONIC PROSTATE (TUBP);  Surgeon: [name], MD;  Location: [facility];  Service: Urology;  Laterality: N/A;  . RIGHT/LEFT HEART CATH AND CORONARY ANGIOGRAPHY N/A 06/25/2020   Procedure: RIGHT/LEFT HEART CATH AND CORONARY ANGIOGRAPHY;  Surgeon: Belva Crome, MD;  Location: [facility];  Service: Cardiovascular;  Laterality: N/A;  . ROTATOR CUFF REPAIR Right 2016  . THROAT SURGERY  04/20/2018  . TRANSRECTAL ULTRASOUND PROSTATE BX  05-23-2005  &  04-19-2001  . TRANSTHORACIC ECHOCARDIOGRAM  09/2017   EF 45-50 % (previously reported as 40 to 45%).  Incoordinate septal motion with mild LVH.  GR 1 DD.  Aortic sclerosis but no stenosis.    Allergies  Allergies  Allergen Reactions  . Nitroglycerin Anaphylaxis    "Cardiologist suggested he shouldn't take this med because it could kill him  with his heart condition. Lowers BP"  . Crestor [Rosuvastatin] Other (See Comments)    Body aches  . Ace Inhibitors Swelling  . Lovastatin Nausea Only  . Solarcaine [Benzocaine] Dermatitis    History of Present Illness    Mr. 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  He was seen in follow-up by Dr. Ellyn Hack on 05/20/2020.  At that time he reported he was feeling better with having taken 3 days of  furosemide.  His breathing was improved.  He denied lower extremity edema.  He still noted exertional dyspnea but denied chest pain and is breathing had significantly improved.  He reported that despite having symptoms he was still walking daily.  An echocardiogram was ordered.  He underwent echocardiogram 06/16/2020.  It showed severely reduced LVEF at less than 20%.  His ventricle was noted to be dilated and not relaxing well.  This is felt to be the clear reason for his shortness of breath.  No significant valvular abnormalities noted.  Cardiac catheterization was recommended.  He was seen virtually 06/19/2020 for follow-up and stated he continued to have increased dyspnea with increased exertion.  He had remained physically active doing walks and working in his shop.  We reviewed his echocardiogram and recommendations for cardiac catheterization.  He expressed understanding pertaining to risks of cardiac catheterization and agreed to receive blood products if needed.  I  ordered a CBC, BMP, and planned follow-up after cardiac catheterization.  He was accompanied by [name] a friend who he gave permission to contact with pertinent medical information.  He underwent cardiac catheterization 06/25/2020 which showed left dominant coronary with widely patent coronary arteries, severe left ventricular systolic dysfunction and moderately elevated LVEDP consistent with chronic systolic CHF.  He was also noted to have mild pulmonary hypertension with mean PA pressure of 24 mmHg.  He presents to the clinic today for follow-up evaluation states he feels fairly well.  He presents with his friend [name].  He continues to walk around 1 mile per day or more.  He states that he enjoys hunting and on days when he hunts he walks more than 2 miles.  He does notice some increased shortness of breath with increased physical activity.  He brings a blood pressure log which shows well-controlled blood pressures.  In the  office today his blood pressure is 100/62.  We reviewed his cardiac catheterization.  He reports that he feels he did not receive any sedation prior to start of his cardiac catheterization and states that he had to ask for sedation twice through the procedure.  Dr. Ellyn Hack initiated carvedilol due to his allergy to ACE inhibitors patient not felt to be a candidate for ARB/Arni.  We discussed referring to EP for evaluation of ICD.  I will have him maintain his physical activity, give him the salty 6 diet sheet, repeat echocardiogram in 2-1/2 months, and follow-up with Dr. Ellyn Hack in 3 months.  Today he denies chest pain, increased shortness of breath, lower extremity edema, fatigue, palpitations, melena, hematuria, hemoptysis, diaphoresis, weakness, presyncope, syncope, orthopnea, and PND.  Home Medications    Prior to Admission medications   Medication Sig Start Date End Date Taking? Authorizing Provider  aspirin EC 81 MG tablet Take 1 tablet (81 mg total) by mouth daily. 04/26/18   Costella, Vista Mink, PA-C  carvedilol (COREG) 3.125 MG tablet Take 1  tablet (3.125 mg total) by mouth 2 (two) times daily with a meal. 06/26/20   Leonie Man, MD  furosemide (LASIX) 40 MG tablet Take  40 mg  twice a week , may take an additional 40 mg if wieight is greater than 3 lbs and  Or increase shortness of breath ,or swelling Patient taking differently: Take 40 mg by mouth See admin instructions. Take  40 mg  twice a week , may take an additional 40 mg if wieight is greater than 3 lbs and  Or increase shortness of breath ,or swelling 05/20/20   Leonie Man, MD  gabapentin (NEURONTIN) 300 MG capsule Take 1-2 capsules (300-600 mg total) by mouth 3 (three) times daily as needed (pain). 06/24/20   [name], MD  Garlic [id number] MG CAPS Take 1,000 mg by mouth daily.    [provider]  HYDROcodone-acetaminophen (NORCO/VICODIN) 5-325 MG tablet Take 1 tablet by mouth every 4 (four) hours as needed for  pain. 06/18/20   [provider]  magnesium oxide (MAG-OX) 400 MG tablet Take 400 mg by mouth daily.    [provider]  Multiple Vitamin (MULTIVITAMIN WITH MINERALS) TABS tablet Take 1 tablet by mouth daily. One-A-Day Multivitamin    [provider]  Omega-3 Fatty Acids (FISH OIL) 500 MG CAPS Take 500 mg by mouth daily.    [provider]  rosuvastatin (CRESTOR) 10 MG tablet Take 1 tablet (10 mg total) by mouth daily. 05/20/20 08/18/20  Leonie Man, MD  sildenafil (VIAGRA) 100 MG tablet 1/2 to 1 tablet up to each day as needed. Patient taking differently: Take 50-100 mg by mouth as needed for erectile dysfunction. 07/08/11   [name], MD  tamsulosin (FLOMAX) 0.4 MG CAPS capsule TAKE 1 CAPSULE EVERY DAY 06/24/20   [name], MD  Turmeric 500 MG TABS Take 500 mg by mouth daily.    [provider]  vitamin B-12 (CYANOCOBALAMIN) 500 MCG tablet Take 500 mcg by mouth daily.    [provider]  Zinc 50 MG TABS Take 50 mg by mouth daily.    [provider]    Family History    Family History  Problem Relation Age of Onset  . Arthritis Mother   . COPD Father   . Cancer Father        lung cancer  . Cancer Sister   . Cardiomyopathy Other        idiopathic. trivial disease 2004 cath, 2010 cardiac CT - no sig. dz, nl LV fxn. cards: Little  . Colon cancer Neg Hx    He indicated that his mother is deceased. He indicated that his father is deceased. He indicated that his sister is alive. He indicated that his maternal grandmother is deceased. He indicated that his maternal grandfather is deceased. He indicated that his paternal grandmother is deceased. He indicated that his paternal grandfather is deceased. He indicated that the status of his neg hx is unknown. He indicated that the status of his other is unknown.  Social History    Social History   Socioeconomic History  . Marital status: Widowed    Spouse name: Not  on file  . Number of children: 2  . Years of education: 76  . Highest education level: High school graduate  Occupational History  . 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  Tobacco Use  . Smoking status: Former Smoker    Packs/day:  2.00    Years: 20.00    Pack years: 40.00    Types: Cigarettes    Quit date: 03/28/1980    Years since quitting: 40.3  . Smokeless tobacco: Never Used  Vaping Use  . Vaping Use: Never used  Substance and Sexual Activity  . Alcohol use: No    Alcohol/week: 0.0 standard drinks  . Drug use: No  . Sexual activity: Not on file  Other Topics Concern  . Not on file  Social History Narrative   He walks on a relatively regular basis about 15 minutes at a time mostly to the discomfort. He previously had walked up a 30-40 minutes a time without problems. Education: [employer].   Lives alone.   Right-handed.   One cup coffee daily.   Social Determinants of Health   Financial Resource Strain: Not on file  Food Insecurity: Not on file  Transportation Needs: Not on file  Physical Activity: Not on file  Stress: Not on file  Social Connections: Not on file  Intimate Partner Violence: Not on file     Review of Systems    General:  No chills, fever, night sweats or weight changes.  Cardiovascular:  No chest pain, dyspnea on exertion, edema, orthopnea, palpitations, paroxysmal nocturnal dyspnea. Dermatological: No rash, lesions/masses Respiratory: No cough, dyspnea Urologic: No hematuria, dysuria Abdominal:   No nausea, vomiting, diarrhea, bright red blood per rectum, melena, or hematemesis Neurologic:  No visual changes, wkns, changes in mental status. All other systems reviewed and are otherwise negative except as noted above.  Physical Exam    VS:  BP 100/62 (BP Location: Left Arm, Patient Position: Sitting, Cuff Size: Normal)   Pulse 82   Ht 5\' 10"  (1.778 m)   Wt 190 lb 6.4 oz (86.4 kg)   BMI 27.32 kg/m  ,  BMI Body mass index is 27.32 kg/m. GEN: Well nourished, well developed, in no acute distress. HEENT: normal. Neck: Supple, no JVD, carotid bruits, or masses. Cardiac: RRR, no murmurs, rubs, or gallops. No clubbing, cyanosis, edema.  Radials/DP/PT 2+ and equal bilaterally.  Respiratory:  Respirations regular and unlabored, clear to auscultation bilaterally. GI: Soft, nontender, nondistended, BS + x 4. MS: no deformity or atrophy. Skin: warm and dry, no rash. Neuro:  Strength and sensation are intact. Psych: Normal affect.  Accessory Clinical Findings    Recent Labs: 01/08/2020: ALT 20 05/17/2020: B Natriuretic Peptide 359.2 06/23/2020: BUN 24; Creatinine, Ser 1.28; Platelets 154 06/25/2020: Hemoglobin 12.2; Potassium 4.1; Sodium 142   Recent Lipid Panel    Component Value Date/Time   CHOL 213 (H) 02/27/2020 1120   TRIG 172 (H) 02/27/2020 1120   HDL 36 (L) 02/27/2020 1120   CHOLHDL 5.9 (H) 02/27/2020 1120   CHOLHDL 6.5 (H) 10/26/2015 1114   VLDL 66 (H) 10/26/2015 1114   LDLCALC 146 (H) 02/27/2020 1120    ECG personally reviewed by me today-none today.  Echocardiogram 06/16/2020 IMPRESSIONS    1. Left ventricular ejection fraction, by estimation, is <20%. The left  ventricle has severely decreased function. The left ventricle demonstrates  global hypokinesis. The left ventricular internal cavity size was  moderately to severely dilated. There is  mild left ventricular hypertrophy. Left ventricular diastolic parameters  are consistent with Grade II diastolic dysfunction (pseudonormalization).  Elevated left ventricular end-diastolic pressure.  2. Right ventricular systolic function is mildly reduced. The right  ventricular size is normal.  3. Left atrial size was mildly dilated.  4. The mitral valve is  grossly normal. Mild mitral valve regurgitation.  5. The aortic valve is tricuspid. There is mild calcification of the  aortic valve. Aortic valve regurgitation is not  visualized. No aortic  stenosis is present.  Cardiac catheterization 06/25/2020  Left dominant coronary anatomy with widely patent, normal coronary arteries.  Severe left ventricular systolic dysfunction and moderately elevated LVEDP at 23 mmHg.  Consistent with chronic systolic heart failure.  Mild pulmonary hypertension with mean PA pressure 24 mmHg, capillary wedge mean pressure 19 mmHg, cardiac output 6.32 L/min and cardiac index 3.1 L/min/m  RECOMMENDATIONS:   Guideline directed therapy for systolic heart failure: 4 pillar approach with Entresto, MRA, SGLT2, and beta-blocker therapy titrated against the patient's blood pressure and  Tolerability.  Per Dr. Ellyn Hack  Diagnostic Dominance: Left    Intervention    Assessment & Plan   1.   Nonischemic cardiomyopathy/acute on chronic CHF with reduced EF/DOE -echocardiogram showed EF less than 20%.  Denies chest pain.  Continues with dyspnea with exertion.  Underwent cardiac catheterization 06/25/2020 which showed left dominant coronary with widely patent coronary arteries, severe left ventricular systolic dysfunction and moderately elevated LVEDP consistent with chronic systolic CHF.  He was also noted to have mild pulmonary hypertension with mean PA pressure of 24 mmHg. Continue aspirin, furosemide, rosuvastatin, magnesium,coreg, sildenafil Cardiac catheterization reviewed Refill carvedilol Refer to EP for ICD evaluation Repeat echocardiogram prior to follow-up visit.  Left bundle branch block- EKG 06/23/2020 showed normal sinus rhythm LBBB. Continue to monitor  Hyperlipidemia-02/27/2020: Cholesterol, Total 213; HDL 36; LDL Chol Calc (NIH) 146; Triglycerides 172 Continue rosuvastatin, omega-3 Heart healthy low-sodium diet-salty 6 given Increase physical activity as tolerated  Disposition: Follow-up with Dr. Ellyn Hack in 3 months and EP in 1 month.   Jossie Ng. Ladd Cen NP-C    07/22/2020, 8:28 AM [place] [address] Office [phone] Fax [phone]  Notice: This dictation was prepared with Dragon dictation along with smaller phrase technology. Any transcriptional errors that result from this process are unintentional and may not be corrected upon review.  I spent 15 minutes examining this patient, reviewing medications, and using patient centered shared decision making involving her cardiac care.  Prior to her visit I spent greater than 20 minutes reviewing her past medical history,  medications, and prior cardiac tests.

## 2020-07-22 ENCOUNTER — Encounter: Payer: Self-pay | Admitting: General Practice

## 2020-07-22 ENCOUNTER — Other Ambulatory Visit: Payer: Self-pay

## 2020-07-22 ENCOUNTER — Ambulatory Visit: Payer: Medicare HMO | Admitting: General Practice

## 2020-07-22 VITALS — BP 100/62 | HR 82 | Ht 70.0 in | Wt 190.4 lb

## 2020-07-22 DIAGNOSIS — Z79899 Other long term (current) drug therapy: Secondary | ICD-10-CM

## 2020-07-22 DIAGNOSIS — E785 Hyperlipidemia, unspecified: Secondary | ICD-10-CM

## 2020-07-22 DIAGNOSIS — I447 Left bundle-branch block, unspecified: Secondary | ICD-10-CM | POA: Diagnosis not present

## 2020-07-22 DIAGNOSIS — I5023 Acute on chronic systolic (congestive) heart failure: Secondary | ICD-10-CM

## 2020-07-22 DIAGNOSIS — I428 Other cardiomyopathies: Secondary | ICD-10-CM

## 2020-07-22 MED ORDER — CARVEDILOL 3.125 MG PO TABS: 3.1250 mg | ORAL_TABLET | Freq: Two times a day (BID) | ORAL | 3 refills | Status: DC

## 2020-07-22 NOTE — Addendum Note (Signed)
Addended by: Waylan Rocher on: 07/22/2020 10:01 AM   Modules accepted: Orders

## 2020-07-22 NOTE — Patient Instructions (Signed)
Medication Instructions:  The current medical regimen is effective;  continue present plan and medications as directed. Please refer to the Current Medication list given to you today.  *If you need a refill on your cardiac medications before your next appointment, please call your pharmacy*  Lab Work: BMET IN 1 WEEK (07-29-2020) If you have labs (blood work) drawn today and your tests are completely normal, you will receive your results only by:  [place] (if you have MyChart) OR A paper copy in the mail.  If you have any lab test that is abnormal or we need to change your treatment, we will call you to review the results. You may go to any [facility] that is convenient for you however, we do have a lab in our office that is able to assist you. You DO NOT need an appointment for our lab. The lab is open 8:00am and closes at 4:00pm. Lunch 12:45 - 1:45pm.  Testing/Procedures: REFERRAL TO ELECTROPHYSIOLOGY  Special Instructions PLEASE READ AND FOLLOW SALTY 6-ATTACHED-1,800 mg daily  PLEASE MAINTAIN PHYSICAL ACTIVITY AS TOLERATED  Follow-Up: Your next appointment:  3 month(s) In Person with Glenetta Hew, MD OR IF UNAVAILABLE [place], FNP-C   At [facility], you and your health needs are our priority.  As part of our continuing mission to provide you with exceptional heart care, we have created designated Provider Care Teams.  These Care Teams include your primary Cardiologist (physician) and Advanced Practice Providers (APPs -  Physician Assistants and Nurse Practitioners) who all work together to provide you with the care you need, when you need it.

## 2020-07-23 MED ORDER — FUROSEMIDE 40 MG PO TABS: 40.0000 mg | ORAL_TABLET | ORAL | 3 refills | Status: DC

## 2020-08-06 DIAGNOSIS — M47812 Spondylosis without myelopathy or radiculopathy, cervical region: Secondary | ICD-10-CM | POA: Diagnosis not present

## 2020-08-07 DIAGNOSIS — D3132 Benign neoplasm of left choroid: Secondary | ICD-10-CM | POA: Diagnosis not present

## 2020-08-07 DIAGNOSIS — H02831 Dermatochalasis of right upper eyelid: Secondary | ICD-10-CM | POA: Diagnosis not present

## 2020-08-07 DIAGNOSIS — H43812 Vitreous degeneration, left eye: Secondary | ICD-10-CM | POA: Diagnosis not present

## 2020-08-07 DIAGNOSIS — H2513 Age-related nuclear cataract, bilateral: Secondary | ICD-10-CM | POA: Diagnosis not present

## 2020-08-07 DIAGNOSIS — H02834 Dermatochalasis of left upper eyelid: Secondary | ICD-10-CM | POA: Diagnosis not present

## 2020-08-13 ENCOUNTER — Ambulatory Visit: Payer: Medicare HMO | Admitting: Cardiology

## 2020-08-25 ENCOUNTER — Encounter: Payer: Self-pay | Admitting: Internal Medicine

## 2020-08-25 ENCOUNTER — Other Ambulatory Visit: Payer: Self-pay

## 2020-08-25 ENCOUNTER — Ambulatory Visit: Payer: Medicare HMO | Admitting: Internal Medicine

## 2020-08-25 VITALS — BP 122/70 | HR 75 | Ht 69.0 in | Wt 190.6 lb

## 2020-08-25 DIAGNOSIS — I428 Other cardiomyopathies: Secondary | ICD-10-CM

## 2020-08-25 NOTE — Progress Notes (Signed)
HPI Mr. Kawano is referred by Coletta Memos, NP for consideration for ICD insertion. He is a pleasant 75 yo man with a long h/o non-ischemic CM, who had an EF of 45% 10 years ago. He developed worsening symptoms and presented in March 2022 and underwent left and right heart cath with an EF of 25%. He did not have obstructive CAD. He has been placed on a diuretic and coreg. He has an allergy to ACE inhibitors. He feels better. He has a repeat echo in 6 weeks. He has not had syncope. He has LBBB.  Allergies  Allergen Reactions  . Nitroglycerin Anaphylaxis    "Cardiologist suggested he shouldn't take this med because it could kill him with his heart condition. Lowers BP"  . Crestor [Rosuvastatin] Other (See Comments)    Body aches  . Ace Inhibitors Swelling  . Lovastatin Nausea Only  . Solarcaine [Benzocaine] Dermatitis     Current Outpatient Medications  Medication Sig Dispense Refill  . aspirin EC 81 MG tablet Take 1 tablet (81 mg total) by mouth daily.    . carvedilol (COREG) 3.125 MG tablet Take 1 tablet (3.125 mg total) by mouth 2 (two) times daily with a meal. 180 tablet 3  . furosemide (LASIX) 40 MG tablet Take 1 tablet (40 mg total) by mouth 2 (two) times a week. Take  40 mg  twice a week , as needed 30 tablet 3  . gabapentin (NEURONTIN) 300 MG capsule Take 1-2 capsules (300-600 mg total) by mouth 3 (three) times daily as needed (pain). 540 capsule 0  . Garlic 6160 MG CAPS Take 1,000 mg by mouth daily.    Marland Kitchen HYDROcodone-acetaminophen (NORCO/VICODIN) 5-325 MG tablet Take 1 tablet by mouth every 4 (four) hours as needed for pain.    . magnesium oxide (MAG-OX) 400 MG tablet Take 400 mg by mouth daily.    . Multiple Vitamin (MULTIVITAMIN WITH MINERALS) TABS tablet Take 1 tablet by mouth daily. One-A-Day Multivitamin    . Omega-3 Fatty Acids (FISH OIL) 500 MG CAPS Take 500 mg by mouth daily.    . sildenafil (VIAGRA) 100 MG tablet 1/2 to 1 tablet up to each day as needed. 10 tablet 5   . tamsulosin (FLOMAX) 0.4 MG CAPS capsule TAKE 1 CAPSULE EVERY DAY 60 capsule 5  . Turmeric 500 MG TABS Take 500 mg by mouth daily.    . vitamin B-12 (CYANOCOBALAMIN) 500 MCG tablet Take 500 mcg by mouth daily.    . Zinc 50 MG TABS Take 50 mg by mouth daily.    . rosuvastatin (CRESTOR) 10 MG tablet Take 1 tablet (10 mg total) by mouth daily. 90 tablet 3   Current Facility-Administered Medications  Medication Dose Route Frequency Provider Last Rate Last Admin  . sodium chloride flush (NS) 0.9 % injection 3 mL  3 mL Intravenous Q12H Deberah Pelton, NP         Past Medical History:  Diagnosis Date  . Arthritis   . BPH (benign prostatic hypertrophy)   . Coronary atherosclerosis of native coronary vessel    CATH 2004--  MINIMAL NONOBSTRUCTIVE LAD/  NORMAL EF/    CARDIAC CT  03/2008  NO SIGNIFICANT DISEASE  AND  nlef  . Elevated PSA   . Eye problem 03/06/2016   [name] retinal detachment left eye, no sx done  . Headache   . History of gout    pt 05-20-2013 states stable   . History of melanoma  excision    2011-- LEFT UPPER BACK  . Hyperlipemia   . Hypertriglyceridemia   . Left bundle branch block    08/2017  . Melanoma ([place]) 2011   back  . Nonischemic cardiomyopathy ([facility])    09-04-2009  --  2D echo-EF 40-45%, Global Hypokinesis - 09/2017 -> EF 45-50%   . Numbness    hands  . Personal history of arthritis   . Personal history of other diseases of circulatory system     ROS:   All systems reviewed and negative except as noted in the HPI.   Past Surgical History:  Procedure Laterality Date  . ANTERIOR CERVICAL DECOMP/DISCECTOMY FUSION N/A 04/20/2018   Procedure: Anterior Cervical Decompression Fusion - Cervical Three-Cervical Four - Cervical Four-Cervical Five - Cervical Five-Cervical Six;  Surgeon: Eustace Moore, MD;  Location: [facility];  Service: Neurosurgery;  Laterality: N/A;  Anterior Cervical Decompression Fusion - Cervical Three-Cervical Four - Cervical Four-Cervical  Five - Cervical Five-Cervical Six  . APPENDECTOMY  AS CHILD  . BACK SURGERY    . CARDIAC CATHETERIZATION  05/23/2002  &  05-23-1998   minimal irregularities in the LAD/  EF 50%  -   DR AL LITTLE  . CARDIOVASCULAR STRESS TEST  10-05-1998   MILD GLOBAL HYPOKINESIS AND ISCHEMIAIN ANTEROSEPTAL  AT [facility]/ EF 42%  . CARPAL TUNNEL RELEASE Right   . EXCISION MELANOMA LEFT UPPER BACK  10-14-2009  . eye lid surgery Bilateral 2018  . KNEE ARTHROSCOPY WITH SUBCHONDROPLASTY Left 04/07/2017   Procedure: LEFT KNEE ARTHROSCOPY WITH PARTIAL MEDIAL MENISCECTOMY AND MEDIAL TIBIAL SUBCHONDROPLASTY;  Surgeon: Mcarthur Rossetti, MD;  Location: [facility];  Service: Orthopedics;  Laterality: Left;  . knee injections Bilateral   . LUMBAR DISC SURGERY  09-12-2000   left  L5 -- S1  . PROSTATE BIOPSY N/A 05/22/2013   Procedure: BIOPSY TRANSRECTAL ULTRASONIC PROSTATE (TUBP);  Surgeon: [name], MD;  Location: [facility];  Service: Urology;  Laterality: N/A;  . RIGHT/LEFT HEART CATH AND CORONARY ANGIOGRAPHY N/A 06/25/2020   Procedure: RIGHT/LEFT HEART CATH AND CORONARY ANGIOGRAPHY;  Surgeon: Belva Crome, MD;  Location: [facility];  Service: Cardiovascular;  Laterality: N/A;  . ROTATOR CUFF REPAIR Right 2016  . THROAT SURGERY  04/20/2018  . TRANSRECTAL ULTRASOUND PROSTATE BX  05-23-2005  &  04-19-2001  . TRANSTHORACIC ECHOCARDIOGRAM  09/2017   EF 45-50 % (previously reported as 40 to 45%).  Incoordinate septal motion with mild LVH.  GR 1 DD.  Aortic sclerosis but no stenosis.     Family History  Problem Relation Age of Onset  . Arthritis Mother   . COPD Father   . Cancer Father        lung cancer  . Cancer Sister   . Cardiomyopathy Other        idiopathic. trivial disease 2004 cath, 2010 cardiac CT - no sig. dz, nl LV fxn. cards: Little  . Colon cancer Neg Hx      Social History   Socioeconomic History  . Marital status: Widowed    Spouse name: Not on file  . Number of  children: 2  . Years of education: 46  . Highest education level: High school graduate  Occupational History  . 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  Tobacco Use  . Smoking status: Former Smoker    Packs/day: 2.00    Years: 20.00    Pack years: 40.00  Types: Cigarettes    Quit date: 03/28/1980    Years since quitting: 40.4  . Smokeless tobacco: Never Used  Vaping Use  . Vaping Use: Never used  Substance and Sexual Activity  . Alcohol use: No    Alcohol/week: 0.0 standard drinks  . Drug use: No  . Sexual activity: Not on file  Other Topics Concern  . Not on file  Social History Narrative   He walks on a relatively regular basis about 15 minutes at a time mostly to the discomfort. He previously had walked up a 30-40 minutes a time without problems. Education: [employer].   Lives alone.   Right-handed.   One cup coffee daily.   Social Determinants of Health   Financial Resource Strain: Not on file  Food Insecurity: Not on file  Transportation Needs: Not on file  Physical Activity: Not on file  Stress: Not on file  Social Connections: Not on file  Intimate Partner Violence: Not on file     BP 122/70   Pulse 75   Ht 5\' 9"  (1.753 m)   Wt 190 lb 9.6 oz (86.5 kg)   SpO2 95%   BMI 28.15 kg/m   Physical Exam:  Well appearing NAD HEENT: Unremarkable Neck:  No JVD, no thyromegally Lymphatics:  No adenopathy Back:  No CVA tenderness Lungs:  Clear with no wheezes HEART:  Regular rate rhythm, no murmurs, no rubs, no clicks Abd:  soft, positive bowel sounds, no organomegally, no rebound, no guarding Ext:  2 plus pulses, no edema, no cyanosis, no clubbing Skin:  No rashes no nodules Neuro:  CN II through XII intact, motor grossly intact  EKG - NSR with LBBB   Assess/Plan: 1. Chronic systolic heart failure - he will continue medical therapy. He will undergo repeat echo in 6 weeks. I discussed ICD insertion with the patient.  I considered adding hydralazine and nitrates but am concerned about hypotension. He has an allergy to statins. He is euvolemic. Aldactone could be added. His bp tends to run low.  2. LBBB - he has not had syncope. I would imagine placing a biv ICD if his EF does not improve. 3. Obesity - he has lost weight and kept it off by dieting.   Carleene Overlie Zailee Vallely,MD

## 2020-08-25 NOTE — Patient Instructions (Addendum)
Medication Instructions:  Your physician recommends that you continue on your current medications as directed. Please refer to the Current Medication list given to you today.  Labwork: None ordered.  Testing/Procedures: None ordered.  Follow-Up: Your physician wants you to follow-up in: as needed with Gregg Taylor, MD    Any Other Special Instructions Will Be Listed Below (If Applicable).  If you need a refill on your cardiac medications before your next appointment, please call your pharmacy.       

## 2020-08-27 ENCOUNTER — Encounter: Payer: Self-pay | Admitting: Family Medicine

## 2020-08-27 ENCOUNTER — Ambulatory Visit: Payer: Medicare HMO | Admitting: Family Medicine

## 2020-08-27 ENCOUNTER — Other Ambulatory Visit: Payer: Self-pay

## 2020-08-27 VITALS — BP 116/68 | HR 70 | Temp 98.3°F | Resp 16 | Ht 69.0 in | Wt 193.0 lb

## 2020-08-27 DIAGNOSIS — G6289 Other specified polyneuropathies: Secondary | ICD-10-CM | POA: Diagnosis not present

## 2020-08-27 DIAGNOSIS — R7303 Prediabetes: Secondary | ICD-10-CM

## 2020-08-27 DIAGNOSIS — R319 Hematuria, unspecified: Secondary | ICD-10-CM | POA: Diagnosis not present

## 2020-08-27 DIAGNOSIS — I428 Other cardiomyopathies: Secondary | ICD-10-CM

## 2020-08-27 DIAGNOSIS — E781 Pure hyperglyceridemia: Secondary | ICD-10-CM | POA: Diagnosis not present

## 2020-08-27 LAB — POCT URINALYSIS DIPSTICK
Bilirubin, UA: NEGATIVE
Blood, UA: NEGATIVE
Glucose, UA: NEGATIVE
Ketones, UA: NEGATIVE
Nitrite, UA: NEGATIVE
Protein, UA: NEGATIVE
Spec Grav, UA: >=1.03 — AB (ref 1.010–1.025)
Urobilinogen, UA: 0.2 E.U./dL
pH, UA: 6 (ref 5.0–8.0)

## 2020-08-27 LAB — URINALYSIS, MICROSCOPIC ONLY

## 2020-08-27 LAB — HEMOGLOBIN A1C: Hgb A1c MFr Bld: 6.9 % — ABNORMAL HIGH (ref 4.6–6.5)

## 2020-08-27 MED ORDER — ROSUVASTATIN CALCIUM 10 MG PO TABS: 10.0000 mg | ORAL_TABLET | Freq: Every day | ORAL | 3 refills | Status: DC

## 2020-08-27 MED ORDER — GABAPENTIN 300 MG PO CAPS: 300.0000 mg | ORAL_CAPSULE | Freq: Three times a day (TID) | ORAL | 0 refills | Status: DC | PRN

## 2020-08-27 NOTE — Patient Instructions (Addendum)
I'm glad to hear that blood in urine resolved, but call Dr. Jackson Latino office to let him know about these symptoms - he may want to see you sooner.   No other med changes at this time. Keep follow up with your other specialists as planned.   Thanks for coming in today.   Return to the clinic or go to the nearest emergency room if any of your symptoms worsen or new symptoms occur.  Office of patient experience: [phone]

## 2020-08-27 NOTE — Progress Notes (Signed)
Subjective:  Patient ID: [name] [name], male    DOB: [birth year]  Age: 75 y.o. MRN: [id number]  CC:  Chief Complaint  Patient presents with  . Hematuria    Pt noticed blood in his urine a week or so ago and was concerned this was a side effect of flomax.   . Hyperlipidemia    Pt in need of refill crestor pt reports has a few tablets left  . Prediabetes    Pt in need of refill gabapentin today as well as crestor.     HPI [name] [name] presents for   Prediabetes: No current meds. Soda - 2 liter per week, but drinking more water.  Lab Results  Component Value Date   HGBA1C 6.3 (H) 01/08/2020   Wt Readings from Last 3 Encounters:  08/27/20 193 lb (87.5 kg)  08/25/20 190 lb 9.6 oz (86.5 kg)  07/22/20 190 lb 6.4 oz (86.4 kg)   Hyperlipidemia: With history of nonischemic cardiomyopathy, appointment with Dr. Lovena Le May 31. EF 25% with left and right heart cath in March 2022.  Did not have obstructive CAD.  He is on diuretic and Coreg, allergy to ACE inhibitor's.  6-week echo planned.  Euvolemic at that visit.  Option to add Aldactone but blood pressure tends to run low.  History of LBBB without syncope.  Considering biventricular ICD if EF does not improve. He does note concern about his last hospitalization and bruising of arm, pain in arm at time of procedure. Offered number for office of patient experience to look into situation further, declined initially, then did agree to number - provided.  Not fasting this am. - 4 hrs ago - coffee and yogurt.   Lab Results  Component Value Date   CHOL 213 (H) 02/27/2020   HDL 36 (L) 02/27/2020   LDLCALC 146 (H) 02/27/2020   TRIG 172 (H) 02/27/2020   CHOLHDL 5.9 (H) 02/27/2020   Lab Results  Component Value Date   ALT 20 01/08/2020   AST 15 01/08/2020   ALKPHOS 71 01/08/2020   BILITOT 0.3 01/08/2020   Hematuria: On flomax for Benign prostatic hypertrophy with lower urinary tract symptoms, elevated PSA previously, evaluated  by urology Dr. Lovena Neighbours. Last OV in 12/2019 - 1 year follow up planned.  Gross hematuria 2 days last week ,then resolved. No dysuria, frequency or change in urination. May have had some low back pain at same time, no hx of kidney stones. He is active and aches and pains in areas at times. No recurrence.   Polyneuropathy: Treated with gabapentin, stable with current dosing. Hx of occipital neuralgia, spinal stenosis. 4-6 pills per day depending on level of activity, working well. No new side effects or dizziness.    History Patient Active Problem List   Diagnosis Date Noted  . Acute on chronic HFrEF (heart failure with reduced ejection fraction) ([place]) 05/20/2020  . Claudication ([place]) 09/01/2019  . S/P cervical spinal fusion 04/20/2018  . Paresthesia 01/10/2018  . Neck pain 01/10/2018  . LBBB (left bundle branch block) 09/07/2017  . Other meniscus derangements, posterior horn of medial meniscus, left knee, insufficency fracture medial tibial plateau 04/07/2017  . Closed nondisplaced fracture of lateral condyle of left tibia with routine healing 04/07/2017  . Status post arthroscopy of left knee 04/07/2017  . Insomnia 09/14/2015  . Hyperlipidemia with target LDL less than 100 09/11/2014  . Rotator cuff tendinitis 04/28/2014  . Obesity (BMI 30-39.9) 12/29/2013  . 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  .  Exertional shortness of breath 06/26/2012  . Polyp of colon, adenomatous 04/27/2012  . Elevated PSA 10/20/2011  . Hypertriglyceridemia 07/08/2011  . Nonischemic cardiomyopathy ([place]) 09/04/2009   Past Medical History:  Diagnosis Date  . Arthritis   . BPH (benign prostatic hypertrophy)   . Coronary atherosclerosis of native coronary vessel    CATH 2004--  MINIMAL NONOBSTRUCTIVE LAD/  NORMAL EF/    CARDIAC CT  03/2008  NO SIGNIFICANT DISEASE  AND  nlef  . Elevated PSA   . Eye problem 03/06/2016   [name] retinal detachment left eye, no sx done  . Headache   . History of gout     pt 05-20-2013 states stable   . History of melanoma excision    2011-- LEFT UPPER BACK  . Hyperlipemia   . Hypertriglyceridemia   . Left bundle branch block    08/2017  . Melanoma ([place]) 2011   back  . Nonischemic cardiomyopathy ([place])    09-04-2009  --  2D echo-EF 40-45%, Global Hypokinesis - 09/2017 -> EF 45-50%   . Numbness    hands  . Personal history of arthritis   . Personal history of other diseases of circulatory system    Past Surgical History:  Procedure Laterality Date  . ANTERIOR CERVICAL DECOMP/DISCECTOMY FUSION N/A 04/20/2018   Procedure: Anterior Cervical Decompression Fusion - Cervical Three-Cervical Four - Cervical Four-Cervical Five - Cervical Five-Cervical Six;  Surgeon: Eustace Moore, MD;  Location: [facility];  Service: Neurosurgery;  Laterality: N/A;  Anterior Cervical Decompression Fusion - Cervical Three-Cervical Four - Cervical Four-Cervical Five - Cervical Five-Cervical Six  . APPENDECTOMY  AS CHILD  . BACK SURGERY    . CARDIAC CATHETERIZATION  05/23/2002  &  05-23-1998   minimal irregularities in the LAD/  EF 50%  -   DR AL LITTLE  . CARDIOVASCULAR STRESS TEST  10-05-1998   MILD GLOBAL HYPOKINESIS AND ISCHEMIAIN ANTEROSEPTAL  AT [facility]/ EF 42%  . CARPAL TUNNEL RELEASE Right   . EXCISION MELANOMA LEFT UPPER BACK  10-14-2009  . eye lid surgery Bilateral 2018  . KNEE ARTHROSCOPY WITH SUBCHONDROPLASTY Left 04/07/2017   Procedure: LEFT KNEE ARTHROSCOPY WITH PARTIAL MEDIAL MENISCECTOMY AND MEDIAL TIBIAL SUBCHONDROPLASTY;  Surgeon: Mcarthur Rossetti, MD;  Location: [facility];  Service: Orthopedics;  Laterality: Left;  . knee injections Bilateral   . LUMBAR DISC SURGERY  09-12-2000   left  L5 -- S1  . PROSTATE BIOPSY N/A 05/22/2013   Procedure: BIOPSY TRANSRECTAL ULTRASONIC PROSTATE (TUBP);  Surgeon: [name], MD;  Location: [facility];  Service: Urology;  Laterality: N/A;  . RIGHT/LEFT HEART CATH AND CORONARY ANGIOGRAPHY N/A 06/25/2020   Procedure:  RIGHT/LEFT HEART CATH AND CORONARY ANGIOGRAPHY;  Surgeon: Belva Crome, MD;  Location: [facility];  Service: Cardiovascular;  Laterality: N/A;  . ROTATOR CUFF REPAIR Right 2016  . THROAT SURGERY  04/20/2018  . TRANSRECTAL ULTRASOUND PROSTATE BX  05-23-2005  &  04-19-2001  . TRANSTHORACIC ECHOCARDIOGRAM  09/2017   EF 45-50 % (previously reported as 40 to 45%).  Incoordinate septal motion with mild LVH.  GR 1 DD.  Aortic sclerosis but no stenosis.   Allergies  Allergen Reactions  . Nitroglycerin Anaphylaxis    "Cardiologist suggested he shouldn't take this med because it could kill him with his heart condition. Lowers BP"  . Crestor [Rosuvastatin] Other (See Comments)    Body aches  . Ace Inhibitors Swelling  . Lovastatin Nausea Only  .  Solarcaine [Benzocaine] Dermatitis   Prior to Admission medications   Medication Sig Start Date End Date Taking? Authorizing Provider  aspirin EC 81 MG tablet Take 1 tablet (81 mg total) by mouth daily. 04/26/18  Yes Costella, Vista Mink, PA-C  carvedilol (COREG) 3.125 MG tablet Take 1 tablet (3.125 mg total) by mouth 2 (two) times daily with a meal. 07/22/20  Yes Cleaver, Jossie Ng, NP  furosemide (LASIX) 40 MG tablet Take 1 tablet (40 mg total) by mouth 2 (two) times a week. Take  40 mg  twice a week , as needed 07/23/20  Yes Cleaver, Jossie Ng, NP  gabapentin (NEURONTIN) 300 MG capsule Take 1-2 capsules (300-600 mg total) by mouth 3 (three) times daily as needed (pain). 06/24/20  Yes [name], MD  Garlic 7564 MG CAPS Take 1,000 mg by mouth daily.   Yes [provider]  HYDROcodone-acetaminophen (NORCO/VICODIN) 5-325 MG tablet Take 1 tablet by mouth every 4 (four) hours as needed for pain. 06/18/20  Yes [provider]  magnesium oxide (MAG-OX) 400 MG tablet Take 400 mg by mouth daily.   Yes [provider]  Multiple Vitamin (MULTIVITAMIN WITH MINERALS) TABS tablet Take 1 tablet by mouth daily. One-A-Day Multivitamin   Yes  [provider]  Omega-3 Fatty Acids (FISH OIL) 500 MG CAPS Take 500 mg by mouth daily.   Yes [provider]  sildenafil (VIAGRA) 100 MG tablet 1/2 to 1 tablet up to each day as needed. 07/08/11  Yes [name], MD  tamsulosin (FLOMAX) 0.4 MG CAPS capsule TAKE 1 CAPSULE EVERY DAY 06/24/20  Yes [name], MD  Turmeric 500 MG TABS Take 500 mg by mouth daily.   Yes [provider]  vitamin B-12 (CYANOCOBALAMIN) 500 MCG tablet Take 500 mcg by mouth daily.   Yes [provider]  Zinc 50 MG TABS Take 50 mg by mouth daily.   Yes [provider]  rosuvastatin (CRESTOR) 10 MG tablet Take 1 tablet (10 mg total) by mouth daily. 05/20/20 08/18/20  Leonie Man, MD   Social History   Socioeconomic History  . Marital status: Widowed    Spouse name: Not on file  . Number of children: 2  . Years of education: 63  . Highest education level: High school graduate  Occupational History  . 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  Tobacco Use  . Smoking status: Former Smoker    Packs/day: 2.00    Years: 20.00    Pack years: 40.00    Types: Cigarettes    Quit date: 03/28/1980    Years since quitting: 40.4  . Smokeless tobacco: Never Used  Vaping Use  . Vaping Use: Never used  Substance and Sexual Activity  . Alcohol use: No    Alcohol/week: 0.0 standard drinks  . Drug use: No  . Sexual activity: Not on file  Other Topics Concern  . Not on file  Social History Narrative   He walks on a relatively regular basis about 15 minutes at a time mostly to the discomfort. He previously had walked up a 30-40 minutes a time without problems. Education: [employer].   Lives alone.   Right-handed.   One cup coffee daily.   Social Determinants of Health   Financial Resource Strain: Not on file  Food Insecurity: Not on file  Transportation Needs: Not on file  Physical Activity: Not on file  Stress: Not on  file  Social Connections: Not on file  Intimate Partner Violence: Not on file    Review of Systems  Constitutional: Negative for fatigue and unexpected weight change.  Eyes: Negative for visual disturbance.  Respiratory: Negative for cough, chest tightness and shortness of breath.   Cardiovascular: Negative for chest pain, palpitations and leg swelling.  Gastrointestinal: Negative for abdominal pain and blood in stool.  Genitourinary: Positive for hematuria. Negative for difficulty urinating, dysuria and frequency.  Neurological: Negative for dizziness, light-headedness and headaches.     Objective:   Vitals:   08/27/20 0958  BP: 116/68  Pulse: 70  Resp: 16  Temp: 98.3 F (36.8 C)  TempSrc: Temporal  SpO2: 96%  Weight: 193 lb (87.5 kg)  Height: 5\' 9"  (1.753 m)     Physical Exam Vitals reviewed.  Constitutional:      Appearance: He is well-developed.  HENT:     Head: Normocephalic and atraumatic.  Eyes:     Pupils: Pupils are equal, round, and reactive to light.  Neck:     Vascular: No carotid bruit or JVD.  Cardiovascular:     Rate and Rhythm: Normal rate and regular rhythm.     Heart sounds: Normal heart sounds. No murmur heard.   Pulmonary:     Effort: Pulmonary effort is normal.     Breath sounds: Normal breath sounds. No rales.  Skin:    General: Skin is warm and dry.  Neurological:     Mental Status: He is alert and oriented to person, place, and time.     Results for orders placed or performed in visit on 08/27/20  POCT Urinalysis Dipstick  Result Value Ref Range   Color, UA straw    Clarity, UA clear    Glucose, UA Negative Negative   Bilirubin, UA Negative    Ketones, UA Negative    Spec Grav, UA >=1.030 (A) 1.010 - 1.025   Blood, UA negative    pH, UA 6.0 5.0 - 8.0   Protein, UA Negative Negative   Urobilinogen, UA 0.2 0.2 or 1.0 E.U./dL   Nitrite, UA negative    Leukocytes, UA Trace (A) Negative   Appearance     Odor     38 minutes  spent during visit, including chart review, counseling and assimilation of information, exam, listening to concern, and discussion of plan for hematuria, and chart completion.     Assessment & Plan:  [name] [name] is a 75 y.o. male . Hematuria, unspecified type - Plan: POCT Urinalysis Dipstick, Urine Microscopic, CANCELED: POCT urinalysis dipstick  - clear at this time, less likely nephrolithiasis without previous history and quick resolution, but with previous elevated PSA, transient hematuria last week, recommended he contact urologist to see if closer follow-up needed.  RTC precautions if recurs.  Other polyneuropathy - Plan: gabapentin (NEURONTIN) 300 MG capsule  -Stable with current dosing of gabapentin, refilled  Prediabetes - Plan: Hemoglobin A1c, Comprehensive metabolic panel  -No current meds, dietary advice given, try to minimize sugar containing beverages.  Check A1c  Nonischemic cardiomyopathy (HCC) - Plan: rosuvastatin (CRESTOR) 10 MG tablet  -Continued on Crestor 10 mg daily, tolerating current dose.  Repeat labs. Hypertriglyceridemia - Plan: rosuvastatin (CRESTOR) 10 MG tablet, Lipid panel  He did share with me some concerns regarding experience at his recent hospital stay.  I did provide him with the number for the office of patient experience to look into those concerns further, including routing those specific questions to the appropriate party.  He was satisfied with this plan.    Meds ordered this encounter  Medications  . gabapentin (NEURONTIN) 300 MG capsule    Sig: Take 1-2 capsules (300-600 mg total) by mouth 3 (three) times daily as needed (pain).    Dispense:  540 capsule    Refill:  0  . rosuvastatin (CRESTOR) 10 MG tablet    Sig: Take 1 tablet (10 mg total) by mouth daily.    Dispense:  90 tablet    Refill:  3   Patient Instructions  I'm glad to hear that blood in urine resolved, but call Dr. Jackson Latino office to let him know about these symptoms - he  may want to see you sooner.   No other med changes at this time. Keep follow up with your other specialists as planned.   Thanks for coming in today.   Return to the clinic or go to the nearest emergency room if any of your symptoms worsen or new symptoms occur.  Office of patient experience: [phone]        Signed, [name], MD [facility]

## 2020-08-28 LAB — COMPREHENSIVE METABOLIC PANEL
ALT: 25 U/L (ref 0–53)
AST: 19 U/L (ref 0–37)
Albumin: 4.4 g/dL (ref 3.5–5.2)
Alkaline Phosphatase: 57 U/L (ref 39–117)
BUN: 24 mg/dL — ABNORMAL HIGH (ref 6–23)
CO2: 26 mEq/L (ref 19–32)
Calcium: 9.5 mg/dL (ref 8.4–10.5)
Chloride: 107 mEq/L (ref 96–112)
Creatinine, Ser: 1.14 mg/dL (ref 0.40–1.50)
GFR: 62.55 mL/min (ref >60.00)
Glucose, Bld: 109 mg/dL — ABNORMAL HIGH (ref 70–99)
Potassium: 4.7 mEq/L (ref 3.5–5.1)
Sodium: 141 mEq/L (ref 135–145)
Total Bilirubin: 0.4 mg/dL (ref 0.2–1.2)
Total Protein: 6.8 g/dL (ref 6.0–8.3)

## 2020-08-28 LAB — LIPID PANEL
Cholesterol: 143 mg/dL (ref 0–200)
HDL: 46.2 mg/dL (ref >39.00)
LDL Cholesterol: 73 mg/dL (ref 0–99)
NonHDL: 96.78
Total CHOL/HDL Ratio: 3
Triglycerides: 118 mg/dL (ref 0.0–149.0)
VLDL: 23.6 mg/dL (ref 0.0–40.0)

## 2020-09-21 ENCOUNTER — Telehealth: Payer: Self-pay | Admitting: Family Medicine

## 2020-09-21 NOTE — Telephone Encounter (Signed)
Pt called in asking for lab results, he said he hasn't heard anything and would like a copy of his lab results mailed to him.

## 2020-09-25 DIAGNOSIS — I428 Other cardiomyopathies: Secondary | ICD-10-CM

## 2020-09-25 HISTORY — DX: Other cardiomyopathies: I42.8

## 2020-10-06 ENCOUNTER — Ambulatory Visit (HOSPITAL_COMMUNITY): Payer: Medicare HMO | Attending: Cardiovascular Disease

## 2020-10-06 ENCOUNTER — Other Ambulatory Visit: Payer: Self-pay

## 2020-10-06 DIAGNOSIS — I428 Other cardiomyopathies: Secondary | ICD-10-CM | POA: Diagnosis not present

## 2020-10-06 HISTORY — PX: TRANSTHORACIC ECHOCARDIOGRAM: SHX275

## 2020-10-06 MED ORDER — PERFLUTREN LIPID MICROSPHERE
1.0000 mL | INTRAVENOUS | Status: AC | PRN
  Administered 2020-10-06: 1 mL via INTRAVENOUS

## 2020-10-07 LAB — ECHOCARDIOGRAM COMPLETE
Area-P 1/2: 3.81 cm2
S' Lateral: 5.8 cm

## 2020-10-13 ENCOUNTER — Telehealth: Payer: Self-pay

## 2020-10-13 ENCOUNTER — Institutional Professional Consult (permissible substitution): Payer: Medicare HMO | Admitting: Internal Medicine

## 2020-10-13 NOTE — Telephone Encounter (Signed)
-----   Message from Evans Lance, MD sent at 10/10/2020  9:15 PM EDT ----- Give him a call. I discussed a biv ICD with him 6 weeks ago. His EF remains very low and he is on maximal medical therapy. I would like to schedule a biv ICD. If he prefers I can see him again this week or next to review the indications in person. GT

## 2020-10-19 NOTE — Telephone Encounter (Signed)
Left detailed message requesting call back to schedule BIV ICD.

## 2020-10-21 ENCOUNTER — Telehealth: Payer: Self-pay | Admitting: Cardiology

## 2020-10-21 MED ORDER — CARVEDILOL 3.125 MG PO TABS: 3.1250 mg | ORAL_TABLET | Freq: Two times a day (BID) | ORAL | 1 refills | Status: DC

## 2020-10-21 NOTE — Telephone Encounter (Signed)
Returned call to discuss scheduling BIV ICD.  Pt thinks his heart is improving. He feels improved.  He will discuss with JC at upcoming appointment.  If Pt changes his mind about scheduling BIV ICD please contact this nurse.  At this time Pt is not interested in [place] ICD.

## 2020-10-21 NOTE — Telephone Encounter (Signed)
*  STAT* If patient is at the pharmacy, call can be transferred to refill team.   1. Which medications need to be refilled? (please list name of each medication and dose if known)  carvedilol (COREG) 3.125 MG tablet  2. Which pharmacy/location (including street and city if local pharmacy) is medication to be sent to? [id number] [place] - [address]. Do they need a 30 day or 90 day supply?  90 day supply  Patient states he has 3 tablets remaining.

## 2020-10-25 NOTE — Progress Notes (Signed)
Cardiology Clinic Note   Patient Name: [name] [name] Date of Encounter: 10/27/2020  Primary Care Provider:  [name], MD Primary Cardiologist:  Glenetta Hew, MD  Patient Profile    [name] [name] is a 75 y.o. male who presents for follow-up of his nonobstructive coronary artery disease.  Past Medical History    Past Medical History:  Diagnosis Date   Arthritis    BPH (benign prostatic hypertrophy)    Coronary atherosclerosis of native coronary vessel    CATH 2004--  MINIMAL NONOBSTRUCTIVE LAD/  NORMAL EF/    CARDIAC CT  03/2008  NO SIGNIFICANT DISEASE  AND  nlef   Elevated PSA    Eye problem 03/06/2016   [name] retinal detachment left eye, no sx done   Headache    History of gout    pt 05-20-2013 states stable    History of melanoma excision    2011-- LEFT UPPER BACK   Hyperlipemia    Hypertriglyceridemia    Left bundle branch block    08/2017   Melanoma ([place]) 2011   back   Nonischemic cardiomyopathy ([place])    09-04-2009  --  2D echo-EF 40-45%, Global Hypokinesis - 09/2017 -> EF 45-50%    Numbness    hands   Personal history of arthritis    Personal history of other diseases of circulatory system    Past Surgical History:  Procedure Laterality Date   ANTERIOR CERVICAL DECOMP/DISCECTOMY FUSION N/A 04/20/2018   Procedure: Anterior Cervical Decompression Fusion - Cervical Three-Cervical Four - Cervical Four-Cervical Five - Cervical Five-Cervical Six;  Surgeon: Eustace Moore, MD;  Location: [facility];  Service: Neurosurgery;  Laterality: N/A;  Anterior Cervical Decompression Fusion - Cervical Three-Cervical Four - Cervical Four-Cervical Five - Cervical Five-Cervical Six   APPENDECTOMY  AS CHILD   BACK SURGERY     CARDIAC CATHETERIZATION  05/23/2002  &  05-23-1998   minimal irregularities in the LAD/  EF 50%  -   DR AL LITTLE   CARDIOVASCULAR STRESS TEST  10-05-1998   MILD GLOBAL HYPOKINESIS AND ISCHEMIAIN ANTEROSEPTAL  AT [facility]/ EF 42%   CARPAL TUNNEL  RELEASE Right    EXCISION MELANOMA LEFT UPPER BACK  10-14-2009   eye lid surgery Bilateral 2018   KNEE ARTHROSCOPY WITH SUBCHONDROPLASTY Left 04/07/2017   Procedure: LEFT KNEE ARTHROSCOPY WITH PARTIAL MEDIAL MENISCECTOMY AND MEDIAL TIBIAL SUBCHONDROPLASTY;  Surgeon: Mcarthur Rossetti, MD;  Location: [facility];  Service: Orthopedics;  Laterality: Left;   knee injections Bilateral    LUMBAR DISC SURGERY  09-12-2000   left  L5 -- S1   PROSTATE BIOPSY N/A 05/22/2013   Procedure: BIOPSY TRANSRECTAL ULTRASONIC PROSTATE (TUBP);  Surgeon: [name], MD;  Location: [facility];  Service: Urology;  Laterality: N/A;   RIGHT/LEFT HEART CATH AND CORONARY ANGIOGRAPHY N/A 06/25/2020   Procedure: RIGHT/LEFT HEART CATH AND CORONARY ANGIOGRAPHY;  Surgeon: Belva Crome, MD;  Location: [facility];  Service: Cardiovascular;  Laterality: N/A;   ROTATOR CUFF REPAIR Right 2016   THROAT SURGERY  04/20/2018   TRANSRECTAL ULTRASOUND PROSTATE BX  05-23-2005  &  04-19-2001   TRANSTHORACIC ECHOCARDIOGRAM  09/2017   EF 45-50 % (previously reported as 40 to 45%).  Incoordinate septal motion with mild LVH.  GR 1 DD.  Aortic sclerosis but no stenosis.    Allergies  Allergies  Allergen Reactions   Nitroglycerin Anaphylaxis    "Cardiologist suggested he shouldn't take this med because  it could kill him with his heart condition. 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   He was seen in follow-up by Dr. Ellyn Hack on 05/20/2020.  At that time he reported he was feeling better with having taken 3 days of furosemide.  His  breathing was improved.  He denied lower extremity edema.  He still noted exertional dyspnea but denied chest pain and is breathing had significantly improved.  He reported that despite having symptoms he was still walking daily.  An echocardiogram was ordered.   He underwent echocardiogram 06/16/2020.  It showed severely reduced LVEF at less than 20%.  His ventricle was noted to be dilated and not relaxing well.  This is felt to be the clear reason for his shortness of breath.  No significant valvular abnormalities noted.  Cardiac catheterization was recommended.   He was seen virtually 06/19/2020 for follow-up and stated he continued to have increased dyspnea with increased exertion.  He had remained physically active doing walks and working in his shop.  We reviewed his echocardiogram and recommendations for cardiac catheterization.  He expressed understanding pertaining to risks of cardiac catheterization and agreed to receive blood products if needed.  I  ordered a CBC, BMP, and planned follow-up after cardiac catheterization.  He was accompanied by [name] a friend who he gave permission to contact with pertinent medical information.   He underwent cardiac catheterization 06/25/2020 which showed left dominant coronary with widely patent coronary arteries, severe left ventricular systolic dysfunction and moderately elevated LVEDP consistent with chronic systolic CHF.  He was also noted to have mild pulmonary hypertension with mean PA pressure of 24 mmHg.   He presented to the clinic 07/22/2020 for follow-up evaluation stated he felt fairly well.  He presented with his friend [name].  He continued to walk around 1 mile per day or more.  He stated that he enjoyed hunting and on days when he hunts he walks more than 2 miles.  He did notice some increased shortness of breath with increased physical activity.  He brought a blood pressure log which showed well-controlled blood pressures.  In the office  today his blood pressure was 100/62.  We reviewed his cardiac catheterization.  He reported that he felt he did not receive any sedation prior to start of his cardiac catheterization and stated that he had to ask for sedation twice through the procedure.  Dr. Ellyn Hack initiated carvedilol due to his allergy to ACE inhibitors patient not felt to be a candidate for ARB/Arni.  We discussed referring to EP for evaluation of ICD.  I will have him maintain his physical activity, give him the salty 6 diet sheet, plan repeat echocardiogram in 2-1/2 months, and follow-up with Dr. Ellyn Hack in 3 months.  Echocardiogram 10/06/2020 showed LVEF 20-25% left ventricular global hypokinesis G1 DD, tricuspid regurgitation, mildly dilated left atria, mild-moderate mitral valve regurgitation.  This was a slight improvement in his LVEF.  He followed up with Dr. Lovena Le on 08/25/2020.  Continued medical management was recommended.  May consider biventricular ICD if EF does not improve.  Recommendation for adding hydralazine and nitrates, and Aldactone if  blood pressure allows.  He presents to the clinic today for follow-up evaluation states he feels well.  He continues to be very physically active walking 1 mile every day.  He also enjoys splitting and stacking wood.  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  He reports that he did most of the work on his own.  He continues to follow a low-salt low sugar diet.  He reports that his current [name] who typically comes with him contracted COVID-19 and was unable to come to the visit today.  We reviewed his echocardiogram and slightly increased/improved EF.  We discussed possibility of needing ICD.  He wishes to defer at this time.  I will give him a salty 6 diet sheet, have him maintain his physical activity and follow-up in 6 months.   Today he denies chest pain, increased shortness of breath, lower extremity edema, fatigue, palpitations, melena, hematuria,  hemoptysis, diaphoresis, weakness, presyncope, syncope, orthopnea, and PND.  Home Medications    Prior to Admission medications   Medication Sig Start Date End Date Taking? Authorizing Provider  aspirin EC 81 MG tablet Take 1 tablet (81 mg total) by mouth daily. 04/26/18   Costella, Vista Mink, PA-C  carvedilol (COREG) 3.125 MG tablet Take 1 tablet (3.125 mg total) by mouth 2 (two) times daily with a meal. 10/21/20   Akari Defelice, Jossie Ng, NP  furosemide (LASIX) 40 MG tablet Take 1 tablet (40 mg total) by mouth 2 (two) times a week. Take  40 mg  twice a week , as needed 07/23/20   Deberah Pelton, NP  gabapentin (NEURONTIN) 300 MG capsule Take 1-2 capsules (300-600 mg total) by mouth 3 (three) times daily as needed (pain). 08/27/20   [name], MD  Garlic [id number] MG CAPS Take 1,000 mg by mouth daily.    [provider]  HYDROcodone-acetaminophen (NORCO/VICODIN) 5-325 MG tablet Take 1 tablet by mouth every 4 (four) hours as needed for pain. 06/18/20   [provider]  magnesium oxide (MAG-OX) 400 MG tablet Take 400 mg by mouth daily.    [provider]  Multiple Vitamin (MULTIVITAMIN WITH MINERALS) TABS tablet Take 1 tablet by mouth daily. One-A-Day Multivitamin    [provider]  Omega-3 Fatty Acids (FISH OIL) 500 MG CAPS Take 500 mg by mouth daily.    [provider]  rosuvastatin (CRESTOR) 10 MG tablet Take 1 tablet (10 mg total) by mouth daily. 08/27/20 11/25/20  [name], MD  sildenafil (VIAGRA) 100 MG tablet 1/2 to 1 tablet up to each day as needed. 07/08/11   [name], MD  tamsulosin (FLOMAX) 0.4 MG CAPS capsule TAKE 1 CAPSULE EVERY DAY 06/24/20   [name], MD  Turmeric 500 MG TABS Take 500 mg by mouth daily.    [provider]  vitamin B-12 (CYANOCOBALAMIN) 500 MCG tablet Take 500 mcg by mouth daily.    [provider]  Zinc 50 MG TABS Take 50 mg by mouth daily.    [provider]    Family History     Family History  Problem Relation Age of Onset   Arthritis Mother    COPD Father    Cancer Father        lung cancer   Cancer Sister    Cardiomyopathy Other        idiopathic. trivial disease 2004 cath, 2010 cardiac CT - no sig. dz, nl LV fxn. cards: Little  Colon cancer Neg Hx    He indicated that his mother is deceased. He indicated that his father is deceased. He indicated that his sister is alive. He indicated that his maternal grandmother is deceased. He indicated that his maternal grandfather is deceased. He indicated that his paternal grandmother is deceased. He indicated that his paternal grandfather is deceased. He indicated that the status of his neg hx is unknown. He indicated that the status of his other is unknown.  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  Tobacco Use   Smoking status: Former    Packs/day: 2.00    Years: 20.00    Pack years: 40.00    Types: Cigarettes    Quit date: 03/28/1980    Years since quitting: 40.6   Smokeless tobacco: Never  Vaping Use   Vaping Use: Never used  Substance and Sexual Activity   Alcohol use: No    Alcohol/week: 0.0 standard drinks   Drug use: No   Sexual activity: Not on file  Other Topics Concern   Not on file  Social History Narrative   He walks on a relatively regular basis about 15 minutes at a time mostly to the discomfort. He previously had walked up a 30-40 minutes a time without problems. Education: [employer].   Lives alone.   Right-handed.   One cup coffee daily.   Social Determinants of Health   Financial Resource Strain: Not on file  Food Insecurity: Not on file  Transportation Needs: Not on file  Physical Activity: Not on file   Stress: Not on file  Social Connections: Not on file  Intimate Partner Violence: Not on file     Review of Systems    General:  No chills, fever, night sweats or weight changes.  Cardiovascular:  No chest pain, dyspnea on exertion, edema, orthopnea, palpitations, paroxysmal nocturnal dyspnea. Dermatological: No rash, lesions/masses Respiratory: No cough, dyspnea Urologic: No hematuria, dysuria Abdominal:   No nausea, vomiting, diarrhea, bright red blood per rectum, melena, or hematemesis Neurologic:  No visual changes, wkns, changes in mental status. All other systems reviewed and are otherwise negative except as noted above.  Physical Exam    VS:  BP 118/62   Pulse 77   Ht '5\' 10"'$  (1.778 m)   Wt 188 lb 3.2 oz (85.4 kg)   SpO2 97%   BMI 27.00 kg/m  , BMI Body mass index is 27 kg/m. GEN: Well nourished, well developed, in no acute distress. HEENT: normal. Neck: Supple, no JVD, carotid bruits, or masses. Cardiac: RRR, no murmurs, rubs, or gallops. No clubbing, cyanosis, edema.  Radials/DP/PT 2+ and equal bilaterally.  Respiratory:  Respirations regular and unlabored, clear to auscultation bilaterally. GI: Soft, nontender, nondistended, BS + x 4. MS: no deformity or atrophy. Skin: warm and dry, no rash. Neuro:  Strength and sensation are intact. Psych: Normal affect.  Accessory Clinical Findings    Recent Labs: 05/17/2020: B Natriuretic Peptide 359.2 06/23/2020: Platelets 154 06/25/2020: Hemoglobin 12.2 08/27/2020: ALT 25; BUN 24; Creatinine, Ser 1.14; Potassium 4.7; Sodium 141   Recent Lipid Panel    Component Value Date/Time   CHOL 143 08/27/2020 1102   CHOL 213 (  H) 02/27/2020 1120   TRIG 118.0 08/27/2020 1102   HDL 46.20 08/27/2020 1102   HDL 36 (L) 02/27/2020 1120   CHOLHDL 3 08/27/2020 1102   VLDL 23.6 08/27/2020 1102   LDLCALC 73 08/27/2020 1102   LDLCALC 146 (H) 02/27/2020 1120    ECG personally reviewed by me today-none today.  Echocardiogram  06/16/2020 IMPRESSIONS     1. Left ventricular ejection fraction, by estimation, is <20%. The left  ventricle has severely decreased function. The left ventricle demonstrates  global hypokinesis. The left ventricular internal cavity size was  moderately to severely dilated. There is   mild left ventricular hypertrophy. Left ventricular diastolic parameters  are consistent with Grade II diastolic dysfunction (pseudonormalization).  Elevated left ventricular end-diastolic pressure.   2. Right ventricular systolic function is mildly reduced. The right  ventricular size is normal.   3. Left atrial size was mildly dilated.   4. The mitral valve is grossly normal. Mild mitral valve regurgitation.   5. The aortic valve is tricuspid. There is mild calcification of the  aortic valve. Aortic valve regurgitation is not visualized. No aortic  stenosis is present.   Echocardiogram 10/06/2020  IMPRESSIONS     1. There is no left ventricular thrombus. There is severe septal-lateral  wall systolic dyssynchrony due to LBBB. Left ventricular ejection  fraction, by estimation, is 20 to 25%. The left ventricle has severely  decreased function. The left ventricle  demonstrates global hypokinesis. The left ventricular internal cavity size  was moderately dilated. Left ventricular diastolic parameters are  consistent with Grade I diastolic dysfunction (impaired relaxation).   2. Right ventricular systolic function is normal. The right ventricular  size is normal. Tricuspid regurgitation signal is inadequate for assessing  PA pressure.   3. Left atrial size was mildly dilated.   4. The mitral valve is normal in structure. Mild to moderate mitral valve  regurgitation.   5. The aortic valve is tricuspid. There is mild calcification of the  aortic valve. There is mild thickening of the aortic valve. Aortic valve  regurgitation is not visualized. Mild aortic valve sclerosis is present,  with no evidence of  aortic valve  stenosis.   6. The inferior vena cava is normal in size with greater than 50%  respiratory variability, suggesting right atrial pressure of 3 mmHg.   Comparison(s): The left ventricular function is unchanged. The left  ventricular diastolic function has improved. 06/16/20 EF <20%.         Assessment & Plan   1.  Nonischemic cardiomyopathy/acute on chronic CHF with reduced EF/DOE -echocardiogram 10/06/2020 showed EF improvement from less than 20% to 20-25% EF.  Has improved/increase his physical activity.  He notes increased dyspnea with increased physical activity.  Cardiac catheterization 06/25/2020 which showed left dominant coronary with widely patent coronary arteries, severe left ventricular systolic dysfunction and moderately elevated LVEDP consistent with chronic systolic CHF.  He was also noted to have mild pulmonary hypertension with mean PA pressure of 24 mmHg.  Blood pressure remains 100-1 teens over 60s.  Patient was referred to Dr. Lovena Le medical management was recommended also, consideration for BiV ICD if no improvement with EF.  Patient does not wish to undergo ICD placement. Continue aspirin, furosemide, rosuvastatin, magnesium,coreg, sildenafil Heart healthy low-sodium diet Maintain physical activity Recommend adding nitrate, hydralazine, spironolactone if blood pressure allows.  Left bundle branch block-denies lightheadedness, dizziness, presyncope or syncope.  Wishes to defer follow-up with the EP at this time.   Continue to  monitor   Hyperlipidemia-02/27/2020: Cholesterol, Total 213; HDL 36; LDL Chol Calc (NIH) 146; Triglycerides 172 Continue rosuvastatin, omega-3 Heart healthy low-sodium high-fiber diet Increase physical activity as tolerated Repeat fasting lipids and LFTs 12/22  Obesity-weight today 188.  Continues to be physically active hunting and walking around his property. Maintain current weight Maintain physical activity   Disposition:  Follow-up with Dr. Ellyn Hack in 4-6 months   Jossie Ng. Erisha Paugh NP-C    10/27/2020, 8:09 AM [place] [address] Office [phone] Fax [phone]  Notice: This dictation was prepared with Dragon dictation along with smaller phrase technology. Any transcriptional errors that result from this process are unintentional and may not be corrected upon review.  I spent 13 minutes examining this patient, reviewing medications, and using patient centered shared decision making involving her cardiac care.  Prior to her visit I spent greater than 20 minutes reviewing her past medical history,  medications, and prior cardiac tests.

## 2020-10-27 ENCOUNTER — Ambulatory Visit: Payer: Medicare HMO | Admitting: General Practice

## 2020-10-27 ENCOUNTER — Other Ambulatory Visit: Payer: Self-pay

## 2020-10-27 ENCOUNTER — Encounter: Payer: Self-pay | Admitting: General Practice

## 2020-10-27 VITALS — BP 118/62 | HR 77 | Ht 70.0 in | Wt 188.2 lb

## 2020-10-27 DIAGNOSIS — I447 Left bundle-branch block, unspecified: Secondary | ICD-10-CM

## 2020-10-27 DIAGNOSIS — Z79899 Other long term (current) drug therapy: Secondary | ICD-10-CM

## 2020-10-27 DIAGNOSIS — E785 Hyperlipidemia, unspecified: Secondary | ICD-10-CM

## 2020-10-27 DIAGNOSIS — I428 Other cardiomyopathies: Secondary | ICD-10-CM | POA: Diagnosis not present

## 2020-10-27 NOTE — Patient Instructions (Addendum)
Medication Instructions:  The current medical regimen is effective;  continue present plan and medications as directed. Please refer to the Current Medication list given to you today.  *If you need a refill on your cardiac medications before your next appointment, please call your pharmacy*  Lab Work:    FASTING LIPID PANEL IN [facility]     Special Instructions PLEASE READ AND FOLLOW SALTY 6-ATTACHED-1,'800mg'$  daily  PLEASE INCREASE PHYSICAL ACTIVITY AS TOLERATED,   Follow-Up: Your next appointment:  6 month(s) In Person with Glenetta Hew, MD OR IF UNAVAILABLE [place], FNP-C   At [facility], you and your health needs are our priority.  As part of our continuing mission to provide you with exceptional heart care, we have created designated Provider Care Teams.  These Care Teams include your primary Cardiologist (physician) and Advanced Practice Providers (APPs -  Physician Assistants and Nurse Practitioners) who all work together to provide you with the care you need, when you need it.            6 SALTY THINGS TO AVOID     1,'800MG'$  DAILY

## 2020-12-22 ENCOUNTER — Other Ambulatory Visit: Payer: Self-pay | Admitting: Family Medicine

## 2020-12-22 DIAGNOSIS — G6289 Other specified polyneuropathies: Secondary | ICD-10-CM

## 2020-12-29 ENCOUNTER — Encounter: Payer: Self-pay | Admitting: Registered Nurse

## 2020-12-29 ENCOUNTER — Other Ambulatory Visit: Payer: Self-pay

## 2020-12-29 ENCOUNTER — Ambulatory Visit (INDEPENDENT_AMBULATORY_CARE_PROVIDER_SITE_OTHER): Payer: Medicare HMO | Admitting: Registered Nurse

## 2020-12-29 VITALS — BP 121/66 | HR 74 | Temp 98.2°F | Resp 18 | Ht 70.0 in | Wt 191.6 lb

## 2020-12-29 DIAGNOSIS — M25579 Pain in unspecified ankle and joints of unspecified foot: Secondary | ICD-10-CM | POA: Diagnosis not present

## 2020-12-29 LAB — CBC WITH DIFFERENTIAL/PLATELET
Basophils Absolute: 0.1 10*3/uL (ref 0.0–0.1)
Basophils Relative: 0.7 % (ref 0.0–3.0)
Eosinophils Absolute: 0.1 10*3/uL (ref 0.0–0.7)
Eosinophils Relative: 1.5 % (ref 0.0–5.0)
HCT: 40 % (ref 39.0–52.0)
Hemoglobin: 13.1 g/dL (ref 13.0–17.0)
Lymphocytes Relative: 32 % (ref 12.0–46.0)
Lymphs Abs: 2.6 10*3/uL (ref 0.7–4.0)
MCHC: 32.8 g/dL (ref 30.0–36.0)
MCV: 90.4 fl (ref 78.0–100.0)
Monocytes Absolute: 0.6 10*3/uL (ref 0.1–1.0)
Monocytes Relative: 7.8 % (ref 3.0–12.0)
Neutro Abs: 4.7 10*3/uL (ref 1.4–7.7)
Neutrophils Relative %: 58 % (ref 43.0–77.0)
Platelets: 128 10*3/uL — ABNORMAL LOW (ref 150.0–400.0)
RBC: 4.42 Mil/uL (ref 4.22–5.81)
RDW: 13.8 % (ref 11.5–15.5)
WBC: 8.1 10*3/uL (ref 4.0–10.5)

## 2020-12-29 LAB — URIC ACID: Uric Acid, Serum: 6.1 mg/dL (ref 4.0–7.8)

## 2020-12-29 MED ORDER — TRAMADOL HCL 50 MG PO TABS
50.0000 mg | ORAL_TABLET | Freq: Two times a day (BID) | ORAL | 0 refills | Status: DC | PRN
Start: 2020-12-29 — End: 2021-04-30

## 2020-12-29 MED ORDER — PREDNISONE 10 MG PO TABS: 10.0000 mg | ORAL_TABLET | Freq: Every day | ORAL | 0 refills | Status: DC

## 2020-12-29 MED ORDER — COLCHICINE 0.6 MG PO TABS: 0.6000 mg | ORAL_TABLET | Freq: Every day | ORAL | 0 refills | Status: DC

## 2020-12-29 NOTE — Progress Notes (Signed)
Established Patient Office Visit  Subjective:  Patient ID: [name] [name], male    DOB: 1945/12/06  Age: 75 y.o. MRN: [id number]  CC:  Chief Complaint  Patient presents with   Pain    Patient states he has been having knee pain , both ankles for 3 weeks and now having the most pain in the right ankle that is swollen , red and hard to walk on.    HPI Elijah Phommachanh presents for acute swelling of R ankle Onset 2 weeks ago Red, swollen lateral malleolus Some pain with pressure. [place] with blanket on it.  No spreading redness or streaking Denies injury or trauma Has tried ice and heat with some relief Hx of gout.  Past Medical History:  Diagnosis Date   Arthritis    BPH (benign prostatic hypertrophy)    Coronary atherosclerosis of native coronary vessel    CATH 2004--  MINIMAL NONOBSTRUCTIVE LAD/  NORMAL EF/    CARDIAC CT  03/2008  NO SIGNIFICANT DISEASE  AND  nlef   Elevated PSA    Eye problem 03/06/2016   [name] retinal detachment left eye, no sx done   Headache    History of gout    pt 05-20-2013 states stable    History of melanoma excision    2011-- LEFT UPPER BACK   Hyperlipemia    Hypertriglyceridemia    Left bundle branch block    08/2017   Melanoma ([place]) 2011   back   Nonischemic cardiomyopathy ([place])    09-04-2009  --  2D echo-EF 40-45%, Global Hypokinesis - 09/2017 -> EF 45-50%    Numbness    hands   Personal history of arthritis    Personal history of other diseases of circulatory system     Past Surgical History:  Procedure Laterality Date   ANTERIOR CERVICAL DECOMP/DISCECTOMY FUSION N/A 04/20/2018   Procedure: Anterior Cervical Decompression Fusion - Cervical Three-Cervical Four - Cervical Four-Cervical Five - Cervical Five-Cervical Six;  Surgeon: Eustace Moore, MD;  Location: [facility];  Service: Neurosurgery;  Laterality: N/A;  Anterior Cervical Decompression Fusion - Cervical Three-Cervical Four - Cervical Four-Cervical Five - Cervical  Five-Cervical Six   APPENDECTOMY  AS CHILD   BACK SURGERY     CARDIAC CATHETERIZATION  05/23/2002  &  05-23-1998   minimal irregularities in the LAD/  EF 50%  -   DR AL LITTLE   CARDIOVASCULAR STRESS TEST  10-05-1998   MILD GLOBAL HYPOKINESIS AND ISCHEMIAIN ANTEROSEPTAL  AT [facility]/ EF 42%   CARPAL TUNNEL RELEASE Right    EXCISION MELANOMA LEFT UPPER BACK  10-14-2009   eye lid surgery Bilateral 2018   KNEE ARTHROSCOPY WITH SUBCHONDROPLASTY Left 04/07/2017   Procedure: LEFT KNEE ARTHROSCOPY WITH PARTIAL MEDIAL MENISCECTOMY AND MEDIAL TIBIAL SUBCHONDROPLASTY;  Surgeon: Mcarthur Rossetti, MD;  Location: [facility];  Service: Orthopedics;  Laterality: Left;   knee injections Bilateral    LUMBAR DISC SURGERY  09-12-2000   left  L5 -- S1   PROSTATE BIOPSY N/A 05/22/2013   Procedure: BIOPSY TRANSRECTAL ULTRASONIC PROSTATE (TUBP);  Surgeon: [name], MD;  Location: [facility];  Service: Urology;  Laterality: N/A;   RIGHT/LEFT HEART CATH AND CORONARY ANGIOGRAPHY N/A 06/25/2020   Procedure: RIGHT/LEFT HEART CATH AND CORONARY ANGIOGRAPHY;  Surgeon: Belva Crome, MD;  Location: [facility];  Service: Cardiovascular;  Laterality: N/A;   ROTATOR CUFF REPAIR Right 2016   THROAT SURGERY  04/20/2018   TRANSRECTAL  ULTRASOUND PROSTATE BX  05-23-2005  &  04-19-2001   TRANSTHORACIC ECHOCARDIOGRAM  09/2017   EF 45-50 % (previously reported as 40 to 45%).  Incoordinate septal motion with mild LVH.  GR 1 DD.  Aortic sclerosis but no stenosis.    Family History  Problem Relation Age of Onset   Arthritis Mother    COPD Father    Cancer Father        lung cancer   Cancer Sister    Cardiomyopathy Other        idiopathic. trivial disease 2004 cath, 2010 cardiac CT - no sig. dz, nl LV fxn. 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  Tobacco Use   Smoking status: Former    Packs/day: 2.00    Years: 20.00    Pack years: 40.00    Types: Cigarettes    Quit date: 03/28/1980    Years since quitting: 40.7   Smokeless tobacco: Never  Vaping Use   Vaping Use: Never used  Substance and Sexual Activity   Alcohol use: No    Alcohol/week: 0.0 standard drinks   Drug use: No   Sexual activity: Not on file  Other Topics Concern   Not on file  Social History Narrative   He walks on a relatively regular basis about 15 minutes at a time mostly to the discomfort. He previously had walked up a 30-40 minutes a time without problems. Education: [employer].   Lives alone.   Right-handed.   One cup coffee daily.   Social Determinants of Health   Financial Resource Strain: Not on file  Food Insecurity: Not on file  Transportation Needs: Not on file  Physical Activity: Not on file  Stress: Not on file  Social Connections: Not on file  Intimate Partner Violence: Not on file    Outpatient Medications Prior to Visit  Medication Sig Dispense Refill   aspirin EC 81 MG tablet Take 1 tablet (81 mg total) by mouth daily.     carvedilol (COREG) 3.125 MG tablet Take 1 tablet (3.125 mg total) by mouth 2 (two) times daily with a meal. 180 tablet 1   furosemide (LASIX) 40 MG tablet Take 1 tablet (40 mg total) by mouth 2 (two) times a week. Take  40 mg  twice a week , as needed 30 tablet 3   gabapentin (NEURONTIN) 300 MG capsule TAKE 1-2 CAPSULES (300-600 MG TOTAL) BY MOUTH 3 (THREE) TIMES DAILY AS NEEDED (PAIN). 224 capsule 0   Garlic 8250 MG CAPS Take 1,000 mg by mouth daily.     HYDROcodone-acetaminophen (NORCO/VICODIN) 5-325 MG tablet Take 1 tablet by mouth every 4 (four) hours as needed for pain.     magnesium oxide (MAG-OX) 400 MG tablet Take 400 mg by mouth daily.     Multiple  Vitamin (MULTIVITAMIN WITH MINERALS) TABS tablet Take 1 tablet by mouth daily. One-A-Day Multivitamin     Omega-3 Fatty Acids (FISH OIL) 500 MG CAPS Take 500 mg by mouth daily.     sildenafil (VIAGRA) 100 MG tablet 1/2 to 1 tablet up to each day as needed. 10 tablet 5   tamsulosin (  FLOMAX) 0.4 MG CAPS capsule TAKE 1 CAPSULE EVERY DAY 60 capsule 5   Turmeric 500 MG TABS Take 500 mg by mouth daily.     vitamin B-12 (CYANOCOBALAMIN) 500 MCG tablet Take 500 mcg by mouth daily.     Zinc 50 MG TABS Take 50 mg by mouth daily.     rosuvastatin (CRESTOR) 10 MG tablet Take 1 tablet (10 mg total) by mouth daily. 90 tablet 3   Facility-Administered Medications Prior to Visit  Medication Dose Route Frequency Provider Last Rate Last Admin   sodium chloride flush (NS) 0.9 % injection 3 mL  3 mL Intravenous Q12H Deberah Pelton, NP        Allergies  Allergen Reactions   Nitroglycerin Anaphylaxis    "Cardiologist suggested he shouldn't take this med because it could kill him with his heart condition. Lowers BP"   Crestor [Rosuvastatin] Other (See Comments)    Body aches   Ace Inhibitors Swelling   Lovastatin Nausea Only   Solarcaine [Benzocaine] Dermatitis    ROS Review of Systems  Constitutional: Negative.   HENT: Negative.    Eyes: Negative.   Respiratory: Negative.    Cardiovascular: Negative.   Gastrointestinal: Negative.   Genitourinary: Negative.   Musculoskeletal:  Positive for arthralgias.  Skin: Negative.   Neurological: Negative.   Psychiatric/Behavioral: Negative.    All other systems reviewed and are negative.    Objective:    Physical Exam Constitutional:      General: He is not in acute distress.    Appearance: Normal appearance. He is normal weight. He is not ill-appearing, toxic-appearing or diaphoretic.  Cardiovascular:     Rate and Rhythm: Normal rate and regular rhythm.     Heart sounds: Normal heart sounds. No murmur heard.   No friction rub. No gallop.   Pulmonary:     Effort: Pulmonary effort is normal. No respiratory distress.     Breath sounds: Normal breath sounds. No stridor. No wheezing, rhonchi or rales.  Chest:     Chest wall: No tenderness.  Musculoskeletal:        General: Swelling (lateral malleolus) and tenderness present. No deformity or signs of injury. Normal range of motion.     Right lower leg: No edema.     Left lower leg: No edema.  Neurological:     General: No focal deficit present.     Mental Status: He is alert and oriented to person, place, and time. Mental status is at baseline.  Psychiatric:        Mood and Affect: Mood normal.        Behavior: Behavior normal.        Thought Content: Thought content normal.        Judgment: Judgment normal.    BP 121/66   Pulse 74   Temp 98.2 F (36.8 C) (Temporal)   Resp 18   Ht 5' 10"  (1.778 m)   Wt 191 lb 9.6 oz (86.9 kg)   SpO2 99%   BMI 27.49 kg/m  Wt Readings from Last 3 Encounters:  12/29/20 191 lb 9.6 oz (86.9 kg)  10/27/20 188 lb 3.2 oz (85.4 kg)  08/27/20 193 lb (87.5 kg)     There are no preventive care reminders to display for this patient.  There are no preventive care reminders to display for this patient.  Lab Results  Component Value Date   TSH 1.280 08/08/2017   Lab Results  Component Value Date   WBC  9.0 06/23/2020   HGB 12.2 (L) 06/25/2020   HCT 36.0 (L) 06/25/2020   MCV 87 06/23/2020   PLT 154 06/23/2020   Lab Results  Component Value Date   NA 141 08/27/2020   K 4.7 08/27/2020   CO2 26 08/27/2020   GLUCOSE 109 (H) 08/27/2020   BUN 24 (H) 08/27/2020   CREATININE 1.14 08/27/2020   BILITOT 0.4 08/27/2020   ALKPHOS 57 08/27/2020   AST 19 08/27/2020   ALT 25 08/27/2020   PROT 6.8 08/27/2020   ALBUMIN 4.4 08/27/2020   CALCIUM 9.5 08/27/2020   ANIONGAP 9 04/12/2018   EGFR 58 (L) 06/23/2020   GFR 62.55 08/27/2020   Lab Results  Component Value Date   CHOL 143 08/27/2020   Lab Results  Component Value Date   HDL  46.20 08/27/2020   Lab Results  Component Value Date   LDLCALC 73 08/27/2020   Lab Results  Component Value Date   TRIG 118.0 08/27/2020   Lab Results  Component Value Date   CHOLHDL 3 08/27/2020   Lab Results  Component Value Date   HGBA1C 6.9 (H) 08/27/2020      Assessment & Plan:   Problem List Items Addressed This Visit   None Visit Diagnoses     Acute ankle pain, unspecified laterality    -  Primary   Relevant Medications   predniSONE (DELTASONE) 10 MG tablet   colchicine 0.6 MG tablet   traMADol (ULTRAM) 50 MG tablet   Other Relevant Orders   CBC with Differential/Platelet   Uric acid   D-Dimer, Quantitative       Meds ordered this encounter  Medications   predniSONE (DELTASONE) 10 MG tablet    Sig: Take 1 tablet (10 mg total) by mouth daily with breakfast.    Dispense:  15 tablet    Refill:  0    Order Specific Question:   Supervising Provider    Answer:   Carlota Raspberry, JEFFREY R [[id number]]   colchicine 0.6 MG tablet    Sig: Take 1 tablet (0.6 mg total) by mouth daily.    Dispense:  10 tablet    Refill:  0    Order Specific Question:   Supervising Provider    Answer:   Carlota Raspberry, JEFFREY R [[id number]]   traMADol (ULTRAM) 50 MG tablet    Sig: Take 1 tablet (50 mg total) by mouth every 12 (twelve) hours as needed.    Dispense:  15 tablet    Refill:  0    Order Specific Question:   Supervising Provider    Answer:   Carlota Raspberry, JEFFREY R [[id number]]    Follow-up: No follow-ups on file.   PLAN Appears as gout. Will give prednisone and short course of colchicine as above. Reviewed risks, benefits, and Aes of this medication with patient who voices understanding Given a few tabs of tramadol for breakthrough pain Cbc, uric acid, and d-dimer on labs today  Patient encouraged to call clinic with any questions, comments, or concerns.  Maximiano Coss, NP

## 2020-12-29 NOTE — Patient Instructions (Addendum)
Mr. Kovalcik -  Doristine Devoid to meet you  I think this is gout. Checking labs to help rule out clot or infection.  Take meds as prescribed.  Let me know if you need     If you have lab work done today you will be contacted with your lab results within the next 2 weeks.  If you have not heard from [place] then please contact us. The fastest way to get your results is to register for My Chart.   IF you received an x-ray today, you will receive an invoice from [facility]. Please contact [facility] at [phone] with questions or concerns regarding your invoice.   IF you received labwork today, you will receive an invoice from [facility]. Please contact [facility] at [phone] with questions or concerns regarding your invoice.   Our billing staff will not be able to assist you with questions regarding bills from these companies.  You will be contacted with the lab results as soon as they are available. The fastest way to get your results is to activate your My Chart account. Instructions are located on the last page of this paperwork. If you have not heard from [place] regarding the results in 2 weeks, please contact this office.

## 2020-12-30 LAB — D-DIMER, QUANTITATIVE: D-Dimer, Quant: 0.31 mcg/mL FEU (ref <0.50)

## 2020-12-31 DIAGNOSIS — R972 Elevated prostate specific antigen [PSA]: Secondary | ICD-10-CM | POA: Diagnosis not present

## 2021-01-02 ENCOUNTER — Other Ambulatory Visit: Payer: Self-pay | Admitting: Cardiology

## 2021-01-07 DIAGNOSIS — R972 Elevated prostate specific antigen [PSA]: Secondary | ICD-10-CM | POA: Diagnosis not present

## 2021-01-07 DIAGNOSIS — N401 Enlarged prostate with lower urinary tract symptoms: Secondary | ICD-10-CM | POA: Diagnosis not present

## 2021-01-07 DIAGNOSIS — R351 Nocturia: Secondary | ICD-10-CM | POA: Diagnosis not present

## 2021-01-13 ENCOUNTER — Other Ambulatory Visit: Payer: Self-pay | Admitting: Urology

## 2021-01-13 DIAGNOSIS — R972 Elevated prostate specific antigen [PSA]: Secondary | ICD-10-CM

## 2021-01-15 DIAGNOSIS — G5602 Carpal tunnel syndrome, left upper limb: Secondary | ICD-10-CM | POA: Diagnosis not present

## 2021-01-26 ENCOUNTER — Other Ambulatory Visit: Payer: Self-pay

## 2021-01-26 ENCOUNTER — Ambulatory Visit
Admission: RE | Admit: 2021-01-26 | Discharge: 2021-01-26 | Disposition: A | Discharge status: Home / Self Care | Payer: Medicare HMO | Source: Ambulatory Visit | Attending: Urology | Admitting: Urology

## 2021-01-26 DIAGNOSIS — R972 Elevated prostate specific antigen [PSA]: Secondary | ICD-10-CM | POA: Diagnosis not present

## 2021-01-26 DIAGNOSIS — K5730 Diverticulosis of large intestine without perforation or abscess without bleeding: Secondary | ICD-10-CM | POA: Diagnosis not present

## 2021-01-26 DIAGNOSIS — N402 Nodular prostate without lower urinary tract symptoms: Secondary | ICD-10-CM | POA: Diagnosis not present

## 2021-01-26 MED ORDER — GADOBENATE DIMEGLUMINE 529 MG/ML IV SOLN
17.0000 mL | Freq: Once | INTRAVENOUS | Status: AC | PRN
  Administered 2021-01-26: 17 mL via INTRAVENOUS

## 2021-02-08 ENCOUNTER — Ambulatory Visit: Payer: Medicare HMO | Admitting: Family Medicine

## 2021-02-25 DIAGNOSIS — R972 Elevated prostate specific antigen [PSA]: Secondary | ICD-10-CM | POA: Diagnosis not present

## 2021-02-25 DIAGNOSIS — C61 Malignant neoplasm of prostate: Secondary | ICD-10-CM | POA: Diagnosis not present

## 2021-02-25 DIAGNOSIS — N411 Chronic prostatitis: Secondary | ICD-10-CM | POA: Diagnosis not present

## 2021-02-26 ENCOUNTER — Ambulatory Visit (INDEPENDENT_AMBULATORY_CARE_PROVIDER_SITE_OTHER): Payer: Medicare HMO | Admitting: Family Medicine

## 2021-02-26 ENCOUNTER — Other Ambulatory Visit (INDEPENDENT_AMBULATORY_CARE_PROVIDER_SITE_OTHER): Payer: Medicare HMO

## 2021-02-26 VITALS — BP 110/66 | HR 72 | Temp 98.2°F | Resp 16 | Ht 70.0 in | Wt 185.8 lb

## 2021-02-26 DIAGNOSIS — G6289 Other specified polyneuropathies: Secondary | ICD-10-CM

## 2021-02-26 DIAGNOSIS — M25579 Pain in unspecified ankle and joints of unspecified foot: Secondary | ICD-10-CM

## 2021-02-26 DIAGNOSIS — M109 Gout, unspecified: Secondary | ICD-10-CM

## 2021-02-26 DIAGNOSIS — E781 Pure hyperglyceridemia: Secondary | ICD-10-CM | POA: Diagnosis not present

## 2021-02-26 DIAGNOSIS — I428 Other cardiomyopathies: Secondary | ICD-10-CM | POA: Diagnosis not present

## 2021-02-26 DIAGNOSIS — R7303 Prediabetes: Secondary | ICD-10-CM

## 2021-02-26 LAB — COMPREHENSIVE METABOLIC PANEL
ALT: 18 U/L (ref 0–53)
AST: 14 U/L (ref 0–37)
Albumin: 4.4 g/dL (ref 3.5–5.2)
Alkaline Phosphatase: 56 U/L (ref 39–117)
BUN: 22 mg/dL (ref 6–23)
CO2: 27 mEq/L (ref 19–32)
Calcium: 9.5 mg/dL (ref 8.4–10.5)
Chloride: 105 mEq/L (ref 96–112)
Creatinine, Ser: 1.06 mg/dL (ref 0.40–1.50)
GFR: 68.02 mL/min (ref >60.00)
Glucose, Bld: 99 mg/dL (ref 70–99)
Potassium: 4.1 mEq/L (ref 3.5–5.1)
Sodium: 136 mEq/L (ref 135–145)
Total Bilirubin: 0.6 mg/dL (ref 0.2–1.2)
Total Protein: 7.1 g/dL (ref 6.0–8.3)

## 2021-02-26 LAB — URIC ACID: Uric Acid, Serum: 6.3 mg/dL (ref 4.0–7.8)

## 2021-02-26 LAB — LIPID PANEL
Cholesterol: 119 mg/dL (ref 0–200)
HDL: 40.4 mg/dL (ref >39.00)
LDL Cholesterol: 60 mg/dL (ref 0–99)
NonHDL: 78.63
Total CHOL/HDL Ratio: 3
Triglycerides: 95 mg/dL (ref 0.0–149.0)
VLDL: 19 mg/dL (ref 0.0–40.0)

## 2021-02-26 LAB — HEMOGLOBIN A1C: Hgb A1c MFr Bld: 6.5 % (ref 4.6–6.5)

## 2021-02-26 MED ORDER — COLCHICINE 0.6 MG PO TABS: ORAL_TABLET | ORAL | 0 refills | Status: DC

## 2021-02-26 MED ORDER — GABAPENTIN 300 MG PO CAPS: 300.0000 mg | ORAL_CAPSULE | Freq: Three times a day (TID) | ORAL | 1 refills | Status: DC | PRN

## 2021-02-26 MED ORDER — ROSUVASTATIN CALCIUM 10 MG PO TABS: 10.0000 mg | ORAL_TABLET | Freq: Every day | ORAL | 3 refills | Status: DC

## 2021-02-26 NOTE — Patient Instructions (Addendum)
Ok to try colchicine at start of gout flare - 2 pills initially, 1 pill an hour later if needed then up to twice per day for a few more days if needed. If pain lasts more than a few days, we may need to consider prednisone.  If you do take colchicine, try to use it for the least amount of time needed and I do recommend stopping your statin medication while taking that medicine to lessen risk of muscle issues.  Please have labs done at [facility] today.  Keep up the good work with diet.  I will check labs but weight has gone down.  Congratulations.  Follow-up with me in 6 months but please let me know if there are questions sooner and take care.

## 2021-02-26 NOTE — Progress Notes (Signed)
Subjective:  Patient ID: [name], male    DOB: Aug 14, 1945  Age: 75 y.o. MRN: [id number]  CC:  Chief Complaint  Patient presents with   Peripheral Neuropathy    Pt reports about the same no real changes, needs refill gabapentin    Hyperlipidemia    Pt here for recheck on labs   Gout    Pt is having a gout flare in foot and would like refill colchicine    Atherosclerosis of Aorta    Review     HPI Jeremian Whitby presents for   Followed by urology for BPH, on Flomax. Prostate biopsy yesterday.   Gout: Last flare: Currently has a flare in his foot - great toe area. Past week or so. No treatment. Out of colchicine. Overall mild symptoms.  Daily meds: none Prn med: Colchicine. Lab Results  Component Value Date   LABURIC 6.1 12/29/2020   Hyperlipidemia: With history of nonischemic cardiomyopathy, diuretic, Coreg, history of LBBB.  Nonobstructive coronary disease on cath previously.no recent CP/dyspnea.  Crestor 10 mg daily. No new side effects.  Fasting today.  Cardiology - Dr. Ellyn Hack.  Lab Results  Component Value Date   CHOL 143 08/27/2020   HDL 46.20 08/27/2020   LDLCALC 73 08/27/2020   TRIG 118.0 08/27/2020   CHOLHDL 3 08/27/2020   Lab Results  Component Value Date   ALT 25 08/27/2020   AST 19 08/27/2020   ALKPHOS 57 08/27/2020   BILITOT 0.4 08/27/2020    Peripheral neuropathy Polyneuropathy treated with gabapentin.  4 to 6 pills/day depending on level of activity prior.  History of occipital neuralgia and spinal stenosis.some knee pains. Neuropathy pain stable. Now only needing 2 pills per day recently.  Left carpal tunnel surgery in January.   Imm review: Shingrix 1st dose few years ago - plans  Utd on flu.  Recommended Bivalent booster.   Prediabetes: Cut back on soda, juice, candy.  Lab Results  Component Value Date   HGBA1C 6.9 (H) 08/27/2020   Wt Readings from Last 3 Encounters:  02/26/21 185 lb 12.8 oz (84.3 kg)  12/29/20 191  lb 9.6 oz (86.9 kg)  10/27/20 188 lb 3.2 oz (85.4 kg)      History Patient Active Problem List   Diagnosis Date Noted   Acute on chronic HFrEF (heart failure with reduced ejection fraction) ([place]) 05/20/2020   Claudication ([place]) 09/01/2019   S/P cervical spinal fusion 04/20/2018   Paresthesia 01/10/2018   Neck pain 01/10/2018   LBBB (left bundle branch block) 09/07/2017   Other meniscus derangements, posterior horn of medial meniscus, left knee, insufficency fracture medial tibial plateau 04/07/2017   Closed nondisplaced fracture of lateral condyle of left tibia with routine healing 04/07/2017   Status post arthroscopy of left knee 04/07/2017   Insomnia 09/14/2015   Hyperlipidemia with target LDL less than 100 09/11/2014   Rotator cuff tendinitis 04/28/2014   Obesity (BMI 30-39.9) 12/29/2013   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   Exertional shortness of breath 06/26/2012   Polyp of colon, adenomatous 04/27/2012   Elevated PSA 10/20/2011   Hypertriglyceridemia 07/08/2011   Nonischemic cardiomyopathy ([place]) 09/04/2009   Past Medical History:  Diagnosis Date   Arthritis    BPH (benign prostatic hypertrophy)    Coronary atherosclerosis of native coronary vessel    CATH 2004--  MINIMAL NONOBSTRUCTIVE LAD/  NORMAL EF/    CARDIAC CT  03/2008  NO SIGNIFICANT DISEASE  AND  nlef   Elevated PSA  Eye problem 03/06/2016   [name] retinal detachment left eye, no sx done   Headache    History of gout    pt 05-20-2013 states stable    History of melanoma excision    2011-- LEFT UPPER BACK   Hyperlipemia    Hypertriglyceridemia    Left bundle branch block    08/2017   Melanoma ([place]) 2011   back   Nonischemic cardiomyopathy ([place])    09-04-2009  --  2D echo-EF 40-45%, Global Hypokinesis - 09/2017 -> EF 45-50%    Numbness    hands   Personal history of arthritis    Personal history of other diseases of circulatory system    Past Surgical History:  Procedure Laterality  Date   ANTERIOR CERVICAL DECOMP/DISCECTOMY FUSION N/A 04/20/2018   Procedure: Anterior Cervical Decompression Fusion - Cervical Three-Cervical Four - Cervical Four-Cervical Five - Cervical Five-Cervical Six;  Surgeon: Eustace Moore, MD;  Location: [facility];  Service: Neurosurgery;  Laterality: N/A;  Anterior Cervical Decompression Fusion - Cervical Three-Cervical Four - Cervical Four-Cervical Five - Cervical Five-Cervical Six   APPENDECTOMY  AS CHILD   BACK SURGERY     CARDIAC CATHETERIZATION  05/23/2002  &  05-23-1998   minimal irregularities in the LAD/  EF 50%  -   DR AL LITTLE   CARDIOVASCULAR STRESS TEST  10-05-1998   MILD GLOBAL HYPOKINESIS AND ISCHEMIAIN ANTEROSEPTAL  AT [facility]/ EF 42%   CARPAL TUNNEL RELEASE Right    EXCISION MELANOMA LEFT UPPER BACK  10-14-2009   eye lid surgery Bilateral 2018   KNEE ARTHROSCOPY WITH SUBCHONDROPLASTY Left 04/07/2017   Procedure: LEFT KNEE ARTHROSCOPY WITH PARTIAL MEDIAL MENISCECTOMY AND MEDIAL TIBIAL SUBCHONDROPLASTY;  Surgeon: Mcarthur Rossetti, MD;  Location: [facility];  Service: Orthopedics;  Laterality: Left;   knee injections Bilateral    LUMBAR DISC SURGERY  09-12-2000   left  L5 -- S1   PROSTATE BIOPSY N/A 05/22/2013   Procedure: BIOPSY TRANSRECTAL ULTRASONIC PROSTATE (TUBP);  Surgeon: [name], MD;  Location: [facility];  Service: Urology;  Laterality: N/A;   RIGHT/LEFT HEART CATH AND CORONARY ANGIOGRAPHY N/A 06/25/2020   Procedure: RIGHT/LEFT HEART CATH AND CORONARY ANGIOGRAPHY;  Surgeon: Belva Crome, MD;  Location: [facility];  Service: Cardiovascular;  Laterality: N/A;   ROTATOR CUFF REPAIR Right 2016   THROAT SURGERY  04/20/2018   TRANSRECTAL ULTRASOUND PROSTATE BX  05-23-2005  &  04-19-2001   TRANSTHORACIC ECHOCARDIOGRAM  09/2017   EF 45-50 % (previously reported as 40 to 45%).  Incoordinate septal motion with mild LVH.  GR 1 DD.  Aortic sclerosis but no stenosis.   Allergies  Allergen Reactions    Nitroglycerin Anaphylaxis    "Cardiologist suggested he shouldn't take this med because it could kill him with his heart condition. Lowers BP"   Crestor [Rosuvastatin] Other (See Comments)    Body aches   Ace Inhibitors Swelling   Lovastatin Nausea Only   Solarcaine [Benzocaine] Dermatitis   Prior to Admission medications   Medication Sig Start Date End Date Taking? Authorizing Provider  aspirin EC 81 MG tablet Take 1 tablet (81 mg total) by mouth daily. 04/26/18  Yes Costella, Vista Mink, PA-C  carvedilol (COREG) 3.125 MG tablet Take 1 tablet (3.125 mg total) by mouth 2 (two) times daily with a meal. 10/21/20  Yes Cleaver, Jossie Ng, NP  colchicine 0.6 MG tablet Take 1 tablet (0.6 mg total) by mouth daily. 12/29/20  Yes Orland Mustard,  Richard, NP  furosemide (LASIX) 40 MG tablet TAKE 1 TAB TWICE A WEEK , MAY TAKE AN ADDITIONAL 1 TAB IF WEIGHT IS GREATER THAN 3 LBS AND OR INCREASE SHORTNESS OF BREATH ,OR SWELLING 01/04/21  Yes Leonie Man, MD  gabapentin (NEURONTIN) 300 MG capsule TAKE 1-2 CAPSULES (300-600 MG TOTAL) BY MOUTH 3 (THREE) TIMES DAILY AS NEEDED (PAIN). 12/22/20  Yes [name], MD  Garlic 2094 MG CAPS Take 1,000 mg by mouth daily.   Yes [provider]  HYDROcodone-acetaminophen (NORCO/VICODIN) 5-325 MG tablet Take 1 tablet by mouth every 4 (four) hours as needed for pain. 06/18/20  Yes [provider]  magnesium oxide (MAG-OX) 400 MG tablet Take 400 mg by mouth daily.   Yes [provider]  Multiple Vitamin (MULTIVITAMIN WITH MINERALS) TABS tablet Take 1 tablet by mouth daily. One-A-Day Multivitamin   Yes [provider]  Omega-3 Fatty Acids (FISH OIL) 500 MG CAPS Take 500 mg by mouth daily.   Yes [provider]  predniSONE (DELTASONE) 10 MG tablet Take 1 tablet (10 mg total) by mouth daily with breakfast. 12/29/20  Yes Maximiano Coss, NP  sildenafil (VIAGRA) 100 MG tablet 1/2 to 1 tablet up to each day as needed. 07/08/11  Yes [name], MD  tamsulosin (FLOMAX) 0.4 MG CAPS capsule TAKE 1 CAPSULE EVERY DAY 06/24/20  Yes [name], MD  traMADol (ULTRAM) 50 MG tablet Take 1 tablet (50 mg total) by mouth every 12 (twelve) hours as needed. 12/29/20  Yes Maximiano Coss, NP  Turmeric 500 MG TABS Take 500 mg by mouth daily.   Yes [provider]  vitamin B-12 (CYANOCOBALAMIN) 500 MCG tablet Take 500 mcg by mouth daily.   Yes [provider]  Zinc 50 MG TABS Take 50 mg by mouth daily.   Yes [provider]  rosuvastatin (CRESTOR) 10 MG tablet Take 1 tablet (10 mg total) by mouth daily. 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  Tobacco Use   Smoking status: Former    Packs/day: 2.00    Years: 20.00    Pack years: 40.00    Types: Cigarettes    Quit date: 03/28/1980    Years since quitting: 40.9   Smokeless tobacco: Never  Vaping Use   Vaping Use: Never used  Substance and Sexual Activity   Alcohol use: No    Alcohol/week: 0.0 standard drinks   Drug use: No   Sexual activity: Not on file  Other Topics Concern   Not on file  Social History Narrative   He walks on a relatively regular basis about 15 minutes at a time mostly to the discomfort. He previously had walked up a 30-40 minutes a time without problems. Education: [employer].   Lives alone.   Right-handed.   One cup coffee daily.   Social Determinants of Health   Financial Resource Strain: Not on file  Food Insecurity: Not on file  Transportation Needs: Not on file  Physical Activity: Not on file  Stress: Not on file  Social Connections: Not on file  Intimate Partner Violence: Not on file    Review of  Systems   Constitutional:  Negative for fatigue and unexpected weight change.  Eyes:  Negative for visual disturbance.  Respiratory:  Negative for cough, chest tightness and shortness of breath.   Cardiovascular:  Negative for chest pain, palpitations and leg swelling.  Gastrointestinal:  Negative for abdominal pain and blood in stool.  Neurological:  Negative for dizziness, light-headedness and headaches.    Objective:   Vitals:   02/26/21 1050  BP: 110/66  Pulse: 72  Resp: 16  Temp: 98.2 F (36.8 C)  TempSrc: Temporal  SpO2: 96%  Weight: 185 lb 12.8 oz (84.3 kg)  Height: 5\' 10"  (1.778 m)     Physical Exam Vitals reviewed.  Constitutional:      Appearance: He is well-developed.  HENT:     Head: Normocephalic and atraumatic.  Neck:     Vascular: No carotid bruit or JVD.  Cardiovascular:     Rate and Rhythm: Normal rate and regular rhythm.     Heart sounds: Normal heart sounds. No murmur heard. Pulmonary:     Effort: Pulmonary effort is normal.     Breath sounds: Normal breath sounds. No rales.  Musculoskeletal:     Right lower leg: No edema.     Left lower leg: No edema.     Comments: No acute focal bony tenderness including of foot  Skin:    General: Skin is warm and dry.  Neurological:     Mental Status: He is alert and oriented to person, place, and time.  Psychiatric:        Mood and Affect: Mood normal.       Assessment & Plan:  Bashir Marchetti is a 75 y.o. male . Prediabetes - Plan: Hemoglobin A1c  Check A1c.  Commended on weight loss.  Continue diet/exercise approach. Nonischemic cardiomyopathy ([place]) - Plan: rosuvastatin (CRESTOR) 10 MG tablet Hypertriglyceridemia - Plan: rosuvastatin (CRESTOR) 10 MG tablet, Comprehensive metabolic panel, Lipid panel  -Tolerating current med regimen, continue same dose statin, check labs.  Other polyneuropathy - Plan: gabapentin (NEURONTIN) 300 MG capsule  -Stable with lower dose of gabapentin typically, higher dosing  with more activity.  No changes for now.  Acute ankle pain, unspecified laterality - Plan: colchicine 0.6 MG tablet Gout, unspecified cause, unspecified chronicity, unspecified site - Plan: Uric acid  -Dosing instructions discussed for colchicine as well as RTC precautions, possible need for prednisone if persistent symptoms.  Meds ordered this encounter  Medications   rosuvastatin (CRESTOR) 10 MG tablet    Sig: Take 1 tablet (10 mg total) by mouth daily.    Dispense:  90 tablet    Refill:  3   gabapentin (NEURONTIN) 300 MG capsule    Sig: Take 1-2 capsules (300-600 mg total) by mouth 3 (three) times daily as needed (pain).    Dispense:  540 capsule    Refill:  1   colchicine 0.6 MG tablet    Sig: 2 pills initially with flare, then up to BID for few days if needed.    Dispense:  20 tablet    Refill:  0   Patient Instructions  Ok to try colchicine at start of gout flare - 2 pills initially, 1 pill an hour later if needed then up to twice per day for a few more days if needed. If pain lasts more than a few days, we may need to consider prednisone.  If you do take colchicine, try to use it for the least amount of time needed and I  do recommend stopping your statin medication while taking that medicine to lessen risk of muscle issues.  Please have labs done at [facility] today.  Keep up the good work with diet.  I will check labs but weight has gone down.  Congratulations.  Follow-up with me in 6 months but please let me know if there are questions sooner and take care.      Signed,   [name], MD [place], [facility] 02/26/21 11:51 AM

## 2021-03-02 DIAGNOSIS — C61 Malignant neoplasm of prostate: Secondary | ICD-10-CM | POA: Diagnosis not present

## 2021-03-02 DIAGNOSIS — R351 Nocturia: Secondary | ICD-10-CM | POA: Diagnosis not present

## 2021-03-02 DIAGNOSIS — N401 Enlarged prostate with lower urinary tract symptoms: Secondary | ICD-10-CM | POA: Diagnosis not present

## 2021-03-06 ENCOUNTER — Encounter (HOSPITAL_BASED_OUTPATIENT_CLINIC_OR_DEPARTMENT_OTHER): Payer: Self-pay

## 2021-03-06 ENCOUNTER — Emergency Department (HOSPITAL_BASED_OUTPATIENT_CLINIC_OR_DEPARTMENT_OTHER): Payer: Medicare HMO

## 2021-03-06 ENCOUNTER — Emergency Department (HOSPITAL_BASED_OUTPATIENT_CLINIC_OR_DEPARTMENT_OTHER)
Admission: EM | Admit: 2021-03-06 | Discharge: 2021-03-06 | Disposition: A | Discharge status: Home / Self Care | Payer: Medicare HMO | Attending: Emergency Medicine | Admitting: Emergency Medicine

## 2021-03-06 ENCOUNTER — Other Ambulatory Visit: Payer: Self-pay

## 2021-03-06 ENCOUNTER — Ambulatory Visit (HOSPITAL_COMMUNITY)
Admission: EM | Admit: 2021-03-06 | Discharge: 2021-03-06 | Disposition: A | Discharge status: Home / Self Care | Payer: Medicare HMO

## 2021-03-06 DIAGNOSIS — Z87891 Personal history of nicotine dependence: Secondary | ICD-10-CM | POA: Insufficient documentation

## 2021-03-06 DIAGNOSIS — Z7982 Long term (current) use of aspirin: Secondary | ICD-10-CM | POA: Insufficient documentation

## 2021-03-06 DIAGNOSIS — M79605 Pain in left leg: Secondary | ICD-10-CM | POA: Diagnosis not present

## 2021-03-06 DIAGNOSIS — M7989 Other specified soft tissue disorders: Secondary | ICD-10-CM | POA: Diagnosis not present

## 2021-03-06 DIAGNOSIS — M7122 Synovial cyst of popliteal space [Baker], left knee: Secondary | ICD-10-CM | POA: Diagnosis not present

## 2021-03-06 DIAGNOSIS — Z79899 Other long term (current) drug therapy: Secondary | ICD-10-CM | POA: Insufficient documentation

## 2021-03-06 DIAGNOSIS — I2510 Atherosclerotic heart disease of native coronary artery without angina pectoris: Secondary | ICD-10-CM | POA: Insufficient documentation

## 2021-03-06 DIAGNOSIS — M660 Rupture of popliteal cyst: Secondary | ICD-10-CM | POA: Diagnosis not present

## 2021-03-06 DIAGNOSIS — M79661 Pain in right lower leg: Secondary | ICD-10-CM | POA: Diagnosis present

## 2021-03-06 DIAGNOSIS — Z85820 Personal history of malignant melanoma of skin: Secondary | ICD-10-CM | POA: Diagnosis not present

## 2021-03-06 DIAGNOSIS — I509 Heart failure, unspecified: Secondary | ICD-10-CM | POA: Diagnosis not present

## 2021-03-06 LAB — CBC WITH DIFFERENTIAL/PLATELET
Abs Immature Granulocytes: 0.02 10*3/uL (ref 0.00–0.07)
Basophils Absolute: 0.1 10*3/uL (ref 0.0–0.1)
Basophils Relative: 1 %
Eosinophils Absolute: 0 10*3/uL (ref 0.0–0.5)
Eosinophils Relative: 0 %
HCT: 37.6 % — ABNORMAL LOW (ref 39.0–52.0)
Hemoglobin: 12.5 g/dL — ABNORMAL LOW (ref 13.0–17.0)
Immature Granulocytes: 0 %
Lymphocytes Relative: 28 %
Lymphs Abs: 3 10*3/uL (ref 0.7–4.0)
MCH: 29.4 pg (ref 26.0–34.0)
MCHC: 33.2 g/dL (ref 30.0–36.0)
MCV: 88.5 fL (ref 80.0–100.0)
Monocytes Absolute: 0.9 10*3/uL (ref 0.1–1.0)
Monocytes Relative: 9 %
Neutro Abs: 6.6 10*3/uL (ref 1.7–7.7)
Neutrophils Relative %: 62 %
Platelets: 147 10*3/uL — ABNORMAL LOW (ref 150–400)
RBC: 4.25 MIL/uL (ref 4.22–5.81)
RDW: 12.9 % (ref 11.5–15.5)
WBC: 10.6 10*3/uL — ABNORMAL HIGH (ref 4.0–10.5)
nRBC: 0 % (ref 0.0–0.2)

## 2021-03-06 LAB — BASIC METABOLIC PANEL
Anion gap: 7 (ref 5–15)
BUN: 24 mg/dL — ABNORMAL HIGH (ref 8–23)
CO2: 27 mmol/L (ref 22–32)
Calcium: 10.1 mg/dL (ref 8.9–10.3)
Chloride: 103 mmol/L (ref 98–111)
Creatinine, Ser: 1.23 mg/dL (ref 0.61–1.24)
GFR, Estimated: >60 mL/min (ref >60)
Glucose, Bld: 115 mg/dL — ABNORMAL HIGH (ref 70–99)
Potassium: 3.9 mmol/L (ref 3.5–5.1)
Sodium: 137 mmol/L (ref 135–145)

## 2021-03-06 LAB — PROTIME-INR
INR: 1.1 (ref 0.8–1.2)
Prothrombin Time: 13.9 seconds (ref 11.4–15.2)

## 2021-03-06 LAB — CK: Total CK: 185 U/L (ref 49–397)

## 2021-03-06 NOTE — ED Triage Notes (Signed)
Pt presents with edema to LLE x2-3 days. Pt reports he hit it back in the spring while building a garage. Pt also reports today LLE warmth today  Per son pt has not been himself, seems a little weak. Pt doesn't feel like he's anymore SOB than usual.   100% AR

## 2021-03-06 NOTE — ED Notes (Signed)
Ultrasound in room with patient

## 2021-03-06 NOTE — ED Notes (Signed)
Patient is being discharged from the Urgent Care and sent to the Emergency Department via car/ son . Per Erin Hearing, patient is in need of higher level of care due to possible blood clot. Patient is aware and verbalizes understanding of plan of care. There were no vitals filed for this visit.

## 2021-03-06 NOTE — ED Provider Notes (Signed)
[facility] EMERGENCY DEPT Provider Note   CSN: [id number] Arrival date & time: 03/06/21  1735     History Chief Complaint  Patient presents with   Leg Pain    [name] [name] is a 75 y.o. male.  He is here with a complaint of 3 to 4 days of right calf pain and swelling.  He does not recall any recent trauma.  He did injure it back in the spring and said it took a long time for the swelling to come down.  He said he had a some minor fall deer hunting 4 weeks ago.  Few days ago he walked a mile [place] and then redid that again yesterday and noticed some discomfort.  Worse with ambulation.  No numbness or weakness.  States it feels hot.  No fevers or chills chest pain shortness of breath.  No prior history of DVT.  The history is provided by the patient.  Leg Pain Location:  Leg Time since incident:  4 days Injury: no   Leg location:  L lower leg Pain details:    Quality:  Aching and tearing   Radiates to:  Does not radiate   Severity:  Moderate   Onset quality:  Gradual   Duration:  4 days   Timing:  Constant   Progression:  Worsening Chronicity:  New Dislocation: no   Prior injury to area:  Yes Relieved by:  None tried Worsened by:  Bearing weight and activity Ineffective treatments:  Rest Associated symptoms: stiffness and swelling   Associated symptoms: no back pain, no fever, no muscle weakness, no numbness and no tingling       Past Medical History:  Diagnosis Date   Arthritis    BPH (benign prostatic hypertrophy)    Coronary atherosclerosis of native coronary vessel    CATH 2004--  MINIMAL NONOBSTRUCTIVE LAD/  NORMAL EF/    CARDIAC CT  03/2008  NO SIGNIFICANT DISEASE  AND  nlef   Elevated PSA    Eye problem 03/06/2016   [name] retinal detachment left eye, no sx done   Headache    History of gout    pt 05-20-2013 states stable    History of melanoma excision    2011-- LEFT UPPER BACK   Hyperlipemia     Hypertriglyceridemia    Left bundle branch block    08/2017   Melanoma ([place]) 2011   back   Nonischemic cardiomyopathy ([place])    09-04-2009  --  2D echo-EF 40-45%, Global Hypokinesis - 09/2017 -> EF 45-50%    Numbness    hands   Personal history of arthritis    Personal history of other diseases of circulatory system     Patient Active Problem List   Diagnosis Date Noted   Acute on chronic HFrEF (heart failure with reduced ejection fraction) ([place]) 05/20/2020   Claudication ([place]) 09/01/2019   S/P cervical spinal fusion 04/20/2018   Paresthesia 01/10/2018   Neck pain 01/10/2018   LBBB (left bundle branch block) 09/07/2017   Other meniscus derangements, posterior horn of medial meniscus, left knee, insufficency fracture medial tibial plateau 04/07/2017   Closed nondisplaced fracture of lateral condyle of left tibia with routine healing 04/07/2017   Status post arthroscopy of left knee 04/07/2017   Insomnia 09/14/2015   Hyperlipidemia with target LDL less than 100 09/11/2014   Rotator cuff tendinitis 04/28/2014   Obesity (BMI 30-39.9) 12/29/2013   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  Exertional shortness of breath 06/26/2012   Polyp of colon, adenomatous 04/27/2012   Elevated PSA 10/20/2011   Hypertriglyceridemia 07/08/2011   Nonischemic cardiomyopathy ([place]) 09/04/2009    Past Surgical History:  Procedure Laterality Date   ANTERIOR CERVICAL DECOMP/DISCECTOMY FUSION N/A 04/20/2018   Procedure: Anterior Cervical Decompression Fusion - Cervical Three-Cervical Four - Cervical Four-Cervical Five - Cervical Five-Cervical Six;  Surgeon: Eustace Moore, MD;  Location: [facility];  Service: Neurosurgery;  Laterality: N/A;  Anterior Cervical Decompression Fusion - Cervical Three-Cervical Four - Cervical Four-Cervical Five - Cervical Five-Cervical Six   APPENDECTOMY  AS CHILD   BACK SURGERY     CARDIAC CATHETERIZATION  05/23/2002  &  05-23-1998   minimal irregularities in the LAD/  EF 50%   -   DR AL LITTLE   CARDIOVASCULAR STRESS TEST  10-05-1998   MILD GLOBAL HYPOKINESIS AND ISCHEMIAIN ANTEROSEPTAL  AT [facility]/ EF 42%   CARPAL TUNNEL RELEASE Right    EXCISION MELANOMA LEFT UPPER BACK  10-14-2009   eye lid surgery Bilateral 2018   KNEE ARTHROSCOPY WITH SUBCHONDROPLASTY Left 04/07/2017   Procedure: LEFT KNEE ARTHROSCOPY WITH PARTIAL MEDIAL MENISCECTOMY AND MEDIAL TIBIAL SUBCHONDROPLASTY;  Surgeon: Mcarthur Rossetti, MD;  Location: [facility];  Service: Orthopedics;  Laterality: Left;   knee injections Bilateral    LUMBAR DISC SURGERY  09-12-2000   left  L5 -- S1   PROSTATE BIOPSY N/A 05/22/2013   Procedure: BIOPSY TRANSRECTAL ULTRASONIC PROSTATE (TUBP);  Surgeon: [name], MD;  Location: [facility];  Service: Urology;  Laterality: N/A;   RIGHT/LEFT HEART CATH AND CORONARY ANGIOGRAPHY N/A 06/25/2020   Procedure: RIGHT/LEFT HEART CATH AND CORONARY ANGIOGRAPHY;  Surgeon: Belva Crome, MD;  Location: [facility];  Service: Cardiovascular;  Laterality: N/A;   ROTATOR CUFF REPAIR Right 2016   THROAT SURGERY  04/20/2018   TRANSRECTAL ULTRASOUND PROSTATE BX  05-23-2005  &  04-19-2001   TRANSTHORACIC ECHOCARDIOGRAM  09/2017   EF 45-50 % (previously reported as 40 to 45%).  Incoordinate septal motion with mild LVH.  GR 1 DD.  Aortic sclerosis but no stenosis.       Family History  Problem Relation Age of Onset   Arthritis Mother    COPD Father    Cancer Father        lung cancer   Cancer Sister    Cardiomyopathy Other        idiopathic. trivial disease 2004 cath, 2010 cardiac CT - no sig. dz, nl LV fxn. cards: Little   Colon cancer Neg Hx     Social History   Tobacco Use   Smoking status: Former    Packs/day: 2.00    Years: 20.00    Pack years: 40.00    Types: Cigarettes    Quit date: 03/28/1980    Years since quitting: 40.9   Smokeless tobacco: Never  Vaping Use   Vaping Use: Never used  Substance Use Topics   Alcohol use: No     Alcohol/week: 0.0 standard drinks   Drug use: No    Home Medications Prior to Admission medications   Medication Sig Start Date End Date Taking? Authorizing Provider  aspirin EC 81 MG tablet Take 1 tablet (81 mg total) by mouth daily. 04/26/18   Costella, Vista Mink, PA-C  carvedilol (COREG) 3.125 MG tablet Take 1 tablet (3.125 mg total) by mouth 2 (two) times daily with a meal. 10/21/20   Cleaver, Jossie Ng, NP  colchicine 0.6  MG tablet 2 pills initially with flare, then up to BID for few days if needed. 02/26/21   [name], MD  furosemide (LASIX) 40 MG tablet TAKE 1 TAB TWICE A WEEK , MAY TAKE AN ADDITIONAL 1 TAB IF WEIGHT IS GREATER THAN 3 LBS AND OR INCREASE SHORTNESS OF BREATH ,OR SWELLING 01/04/21   Leonie Man, MD  gabapentin (NEURONTIN) 300 MG capsule Take 1-2 capsules (300-600 mg total) by mouth 3 (three) times daily as needed (pain). 02/26/21   [name], MD  Garlic 6283 MG CAPS Take 1,000 mg by mouth daily.    [provider]  HYDROcodone-acetaminophen (NORCO/VICODIN) 5-325 MG tablet Take 1 tablet by mouth every 4 (four) hours as needed for pain. 06/18/20   [provider]  magnesium oxide (MAG-OX) 400 MG tablet Take 400 mg by mouth daily.    [provider]  Multiple Vitamin (MULTIVITAMIN WITH MINERALS) TABS tablet Take 1 tablet by mouth daily. One-A-Day Multivitamin    [provider]  Omega-3 Fatty Acids (FISH OIL) 500 MG CAPS Take 500 mg by mouth daily.    [provider]  predniSONE (DELTASONE) 10 MG tablet Take 1 tablet (10 mg total) by mouth daily with breakfast. 12/29/20   Maximiano Coss, NP  rosuvastatin (CRESTOR) 10 MG tablet Take 1 tablet (10 mg total) by mouth daily. 02/26/21 05/27/21  [name], MD  sildenafil (VIAGRA) 100 MG tablet 1/2 to 1 tablet up to each day as needed. 07/08/11   [name], MD  tamsulosin (FLOMAX) 0.4 MG CAPS capsule TAKE 1 CAPSULE EVERY DAY 06/24/20   [name], MD   traMADol (ULTRAM) 50 MG tablet Take 1 tablet (50 mg total) by mouth every 12 (twelve) hours as needed. 12/29/20   Maximiano Coss, NP  Turmeric 500 MG TABS Take 500 mg by mouth daily.    [provider]  vitamin B-12 (CYANOCOBALAMIN) 500 MCG tablet Take 500 mcg by mouth daily.    [provider]  Zinc 50 MG TABS Take 50 mg by mouth daily.    [provider]    Allergies    Nitroglycerin, Crestor [rosuvastatin], Ace inhibitors, Lovastatin, and Solarcaine [benzocaine]  Review of Systems   Review of Systems  Constitutional:  Negative for fever.  Respiratory:  Negative for shortness of breath.   Cardiovascular:  Positive for leg swelling.  Gastrointestinal:  Negative for abdominal pain.  Musculoskeletal:  Positive for gait problem and stiffness. Negative for back pain.  Skin:  Negative for wound.  Neurological:  Negative for weakness and numbness.   Physical Exam Updated Vital Signs BP 140/82 (BP Location: Right Arm)   Pulse 99   Temp 98.1 F (36.7 C) (Oral)   SpO2 99%   Physical Exam Vitals and nursing note reviewed.  Constitutional:      General: He is not in acute distress.    Appearance: Normal appearance. He is well-developed.  HENT:     Head: Normocephalic and atraumatic.  Eyes:     Conjunctiva/sclera: Conjunctivae normal.  Cardiovascular:     Rate and Rhythm: Normal rate and regular rhythm.     Heart sounds: No murmur heard. Pulmonary:     Effort: Pulmonary effort is normal. No respiratory distress.     Breath sounds: Normal breath sounds.  Abdominal:     Palpations: Abdomen is soft.     Tenderness: There is no abdominal tenderness. There is no guarding or rebound.  Musculoskeletal:  General: Tenderness present. No swelling. Normal range of motion.     Cervical back: Neck supple.     Left lower leg: Edema present.     Comments: He has diffuse tenderness throughout his calf.  There is no bony tenderness.  Distal pulses motor and  sensation intact.  No cords appreciated  Skin:    General: Skin is warm and dry.     Capillary Refill: Capillary refill takes less than 2 seconds.  Neurological:     General: No focal deficit present.     Mental Status: He is alert.     Sensory: No sensory deficit.     Motor: No weakness.  Psychiatric:        Mood and Affect: Mood normal.    ED Results / Procedures / Treatments   Labs (all labs ordered are listed, but only abnormal results are displayed) Labs Reviewed  BASIC METABOLIC PANEL - Abnormal; Notable for the following components:      Result Value   Glucose, Bld 115 (*)    BUN 24 (*)    All other components within normal limits  CBC WITH DIFFERENTIAL/PLATELET - Abnormal; Notable for the following components:   WBC 10.6 (*)    Hemoglobin 12.5 (*)    HCT 37.6 (*)    Platelets 147 (*)    All other components within normal limits  PROTIME-INR  CK    EKG None  Radiology US Venous Img Lower Unilateral Left  Result Date: 03/06/2021 CLINICAL DATA:  Left calf pain/swelling x3 days EXAM: LEFT LOWER EXTREMITY VENOUS DOPPLER ULTRASOUND TECHNIQUE: Gray-scale sonography with compression, as well as color and duplex ultrasound, were performed to evaluate the deep venous system(s) from the level of the common femoral vein through the popliteal and proximal calf veins. COMPARISON:  None. FINDINGS: VENOUS Normal compressibility of the common femoral, superficial femoral, and popliteal veins. Calf veins are poorly visualized. Visualized portions of profunda femoral vein and great saphenous vein unremarkable. No filling defects to suggest DVT on grayscale or color Doppler imaging. Doppler waveforms show normal direction of venous flow, normal respiratory plasticity and response to augmentation. Limited views of the contralateral common femoral vein are unremarkable. OTHER 18.0 x 2.5 x 4.6 cm complex fluid collection arising from the left popliteal fossa extending to the calf, possibly  reflecting a complex popliteal fossa cyst versus hematoma. Limitations: none IMPRESSION: No evidence of deep venous thrombosis. 18.0 x 2.5 x 4.6 cm complex fluid collection arising from the left popliteal fossa extending to the calf, possibly reflecting a complex popliteal fossa cyst versus hematoma. Electronically Signed   By: Julian Hy M.D.   On: 03/06/2021 19:32    Procedures Procedures   Medications Ordered in ED Medications - No data to display  ED Course  I have reviewed the triage vital signs and the nursing notes.  Pertinent labs & imaging results that were available during my care of the patient were reviewed by me and considered in my medical decision making (see chart for details).  Clinical Course as of 03/07/21 1937  Sat Mar 06, 2021  2000 Discussed with Dr. Camillo Flaming orthopedics.  He recommends ice compression and follow-up with him in the office at 8 AM on Monday. [MB]    Clinical Course User Index [MB] Hayden Rasmussen, MD   MDM Rules/Calculators/A&P                          This patient  complains of left calf pain; this involves an extensive number of treatment Options and is a complaint that carries with it a high risk of complications and Morbidity. The differential includes DVT, muscle tear, hematoma, Baker's cyst  I ordered, reviewed and interpreted labs, which included CBC with mildly elevated white count, hemoglobin similar to prior, chemistries fairly normal other than mildly elevated glucose, CPK not elevated I ordered imaging studies which included ultrasound of lower extremity and I independently    visualized and interpreted imaging which showed no DVT.  Does have complex cyst or hematoma Additional history obtained from patient's son Previous records obtained and reviewed in epic no recent admissions I consulted Dr. Marlou Sa orthopedics and discussed lab and imaging findings   After the interventions stated above, I reevaluated the patient and  found patient to be otherwise hemodynamically stable.  Reviewed orthopedic recommendations.  He is comfortable plan for outpatient follow-up.  Return instructions discussed   Final Clinical Impression(s) / ED Diagnoses Final diagnoses:  Rupture of popliteal cyst of left knee region    Rx / DC Orders ED Discharge Orders     None        Hayden Rasmussen, MD 03/07/21 [phone]

## 2021-03-06 NOTE — Discharge Instructions (Signed)
You were seen in the emergency department for left calf swelling and pain.  You had an ultrasound that showed a possible ruptured cyst behind your left knee extending into your calf.  There was no evidence of blood clot.  Dr. Marlou Sa wants to see you in the office Monday morning at 8 AM for evaluation.  Compression wrap as tolerated.  Ice.  Limited activity.  Tylenol and ibuprofen as needed for pain.

## 2021-03-08 ENCOUNTER — Other Ambulatory Visit: Payer: Self-pay

## 2021-03-08 ENCOUNTER — Ambulatory Visit: Payer: Self-pay

## 2021-03-08 ENCOUNTER — Ambulatory Visit: Payer: Medicare HMO | Admitting: Orthopedic Surgery

## 2021-03-08 ENCOUNTER — Ambulatory Visit (INDEPENDENT_AMBULATORY_CARE_PROVIDER_SITE_OTHER): Payer: Medicare HMO

## 2021-03-08 DIAGNOSIS — M25562 Pain in left knee: Secondary | ICD-10-CM | POA: Diagnosis not present

## 2021-03-08 DIAGNOSIS — M1712 Unilateral primary osteoarthritis, left knee: Secondary | ICD-10-CM | POA: Diagnosis not present

## 2021-03-08 DIAGNOSIS — M79605 Pain in left leg: Secondary | ICD-10-CM

## 2021-03-10 ENCOUNTER — Other Ambulatory Visit: Payer: Self-pay | Admitting: Family Medicine

## 2021-03-10 DIAGNOSIS — M25579 Pain in unspecified ankle and joints of unspecified foot: Secondary | ICD-10-CM

## 2021-03-11 ENCOUNTER — Encounter: Payer: Self-pay | Admitting: Orthopedic Surgery

## 2021-03-11 MED ORDER — METHYLPREDNISOLONE ACETATE 40 MG/ML IJ SUSP
40.0000 mg | INTRAMUSCULAR | Status: AC | PRN
Start: 2021-03-08 — End: 2021-03-08
  Administered 2021-03-08: 40 mg via INTRA_ARTICULAR

## 2021-03-11 MED ORDER — LIDOCAINE HCL 1 % IJ SOLN
5.0000 mL | INTRAMUSCULAR | Status: AC | PRN
Start: 2021-03-08 — End: 2021-03-08
  Administered 2021-03-08: 5 mL

## 2021-03-11 MED ORDER — BUPIVACAINE HCL 0.25 % IJ SOLN
4.0000 mL | INTRAMUSCULAR | Status: AC | PRN
  Administered 2021-03-08: 4 mL via INTRA_ARTICULAR

## 2021-03-11 NOTE — Progress Notes (Signed)
Had acute  Office Visit Note   Patient: [name] [name]           Date of Birth: 1945/09/20           MRN: [id number] Visit Date: 03/08/2021 Requested by: [name] Agreste, MD [address] [place],  [place] [address] PCP: [name] Agreste, MD  Subjective: Chief Complaint  Patient presents with   Left Knee - Pain    HPI: [name] [name] is a 75 year old patient with left knee pain.  Onset of pain about a week ago.  Painful him to weight-bear.  Reports swelling and weakness.  Doppler done in the emergency department negative for blood clot.  He has had a prior arthroscopic procedure on the knee which may have given him some marginal relief.              ROS: All systems reviewed are negative as they relate to the chief complaint within the history of present illness.  Patient denies  fevers or chills.   Assessment & Plan: Visit Diagnoses:  1. Pain in left leg   2. Left knee pain, unspecified chronicity     Plan: Impression is left knee pain with very atypical cyst in the back of the knee and calf region.  Ultrasound examination demonstrates no aspirate able fluid as we would expect to see with a standard Baker's cyst.  He does have a mild effusion in the left knee as well consistent with known medial compartment arthritis.  Plan is aspiration and injection of the left knee with MRI scan to evaluate this atypical cystic structure filled with heterogeneous signal.  Follow-up after that study.  Follow-Up Instructions: Return for after MRI.   Orders:  Orders Placed This Encounter  Procedures   XR KNEE 3 VIEW LEFT   US Guided Needle Placement - No Linked Charges   MR TIBIA FIBULA LEFT WO CONTRAST   No orders of the defined types were placed in this encounter.     Procedures: Large Joint Inj: L knee on 03/08/2021 11:28 PM Indications: diagnostic evaluation, joint swelling and pain Details: 18 G 1.5 in needle, superolateral approach  Arthrogram: No  Medications: 5 mL lidocaine 1  %; 40 mg methylPREDNISolone acetate 40 MG/ML; 4 mL bupivacaine 0.25 % Outcome: tolerated well, no immediate complications Procedure, treatment alternatives, risks and benefits explained, specific risks discussed. Consent was given by the patient. Immediately prior to procedure a time out was called to verify the correct patient, procedure, equipment, support staff and site/side marked as required. Patient was prepped and draped in the usual sterile fashion.      Clinical Data: No additional findings.  Objective: Vital Signs: There were no vitals taken for this visit.  Physical Exam:   Constitutional: Patient appears well-developed HEENT:  Head: Normocephalic Eyes:EOM are normal Neck: Normal range of motion Cardiovascular: Normal rate Pulmonary/chest: Effort normal Neurologic: Patient is alert Skin: Skin is warm Psychiatric: Patient has normal mood and affect   Ortho Exam: Ortho exam demonstrates slightly antalgic gait to the left.  Pedal pulses palpable.  Calf has mild tenderness but no swelling warmth erythema or induration.  Mild effusion is present in the left knee.  Medial joint line tenderness is present.  Collateral and cruciate ligaments are stable.  No other masses lymphadenopathy or skin changes noted in that left knee region.  Specialty Comments:  No specialty comments available.  Imaging: No results found.   PMFS History: Patient Active Problem List  Diagnosis Date Noted   Acute on chronic HFrEF (heart failure with reduced ejection fraction) ([place]) 05/20/2020   Claudication ([place]) 09/01/2019   S/P cervical spinal fusion 04/20/2018   Paresthesia 01/10/2018   Neck pain 01/10/2018   LBBB (left bundle branch block) 09/07/2017   Other meniscus derangements, posterior horn of medial meniscus, left knee, insufficency fracture medial tibial plateau 04/07/2017   Closed nondisplaced fracture of lateral condyle of left tibia with routine healing 04/07/2017   Status post  arthroscopy of left knee 04/07/2017   Insomnia 09/14/2015   Hyperlipidemia with target LDL less than 100 09/11/2014   Rotator cuff tendinitis 04/28/2014   Obesity (BMI 30-39.9) 12/29/2013   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   Exertional shortness of breath 06/26/2012   Polyp of colon, adenomatous 04/27/2012   Elevated PSA 10/20/2011   Hypertriglyceridemia 07/08/2011   Nonischemic cardiomyopathy ([place]) 09/04/2009   Past Medical History:  Diagnosis Date   Arthritis    BPH (benign prostatic hypertrophy)    Coronary atherosclerosis of native coronary vessel    CATH 2004--  MINIMAL NONOBSTRUCTIVE LAD/  NORMAL EF/    CARDIAC CT  03/2008  NO SIGNIFICANT DISEASE  AND  nlef   Elevated PSA    Eye problem 03/06/2016   [name] retinal detachment left eye, no sx done   Headache    History of gout    pt 05-20-2013 states stable    History of melanoma excision    2011-- LEFT UPPER BACK   Hyperlipemia    Hypertriglyceridemia    Left bundle branch block    08/2017   Melanoma ([place]) 2011   back   Nonischemic cardiomyopathy ([place])    09-04-2009  --  2D echo-EF 40-45%, Global Hypokinesis - 09/2017 -> EF 45-50%    Numbness    hands   Personal history of arthritis    Personal history of other diseases of circulatory system     Family History  Problem Relation Age of Onset   Arthritis Mother    COPD Father    Cancer Father        lung cancer   Cancer Sister    Cardiomyopathy Other        idiopathic. trivial disease 2004 cath, 2010 cardiac CT - no sig. dz, nl LV fxn. cards: Little   Colon cancer Neg Hx     Past Surgical History:  Procedure Laterality Date   ANTERIOR CERVICAL DECOMP/DISCECTOMY FUSION N/A 04/20/2018   Procedure: Anterior Cervical Decompression Fusion - Cervical Three-Cervical Four - Cervical Four-Cervical Five - Cervical Five-Cervical Six;  Surgeon: Eustace Moore, MD;  Location: [facility];  Service: Neurosurgery;  Laterality: N/A;  Anterior Cervical Decompression Fusion  - Cervical Three-Cervical Four - Cervical Four-Cervical Five - Cervical Five-Cervical Six   APPENDECTOMY  AS CHILD   BACK SURGERY     CARDIAC CATHETERIZATION  05/23/2002  &  05-23-1998   minimal irregularities in the LAD/  EF 50%  -   DR AL LITTLE   CARDIOVASCULAR STRESS TEST  10-05-1998   MILD GLOBAL HYPOKINESIS AND ISCHEMIAIN ANTEROSEPTAL  AT [facility]/ EF 42%   CARPAL TUNNEL RELEASE Right    EXCISION MELANOMA LEFT UPPER BACK  10-14-2009   eye lid surgery Bilateral 2018   KNEE ARTHROSCOPY WITH SUBCHONDROPLASTY Left 04/07/2017   Procedure: LEFT KNEE ARTHROSCOPY WITH PARTIAL MEDIAL MENISCECTOMY AND MEDIAL TIBIAL SUBCHONDROPLASTY;  Surgeon: Mcarthur Rossetti, MD;  Location[facility];  Service: Orthopedics;  Laterality: Left;   knee injections Bilateral  LUMBAR DISC SURGERY  09-12-2000   left  L5 -- S1   PROSTATE BIOPSY N/A 05/22/2013   Procedure: BIOPSY TRANSRECTAL ULTRASONIC PROSTATE (TUBP);  Surgeon: [name], MD;  Location: [facility];  Service: Urology;  Laterality: N/A;   RIGHT/LEFT HEART CATH AND CORONARY ANGIOGRAPHY N/A 06/25/2020   Procedure: RIGHT/LEFT HEART CATH AND CORONARY ANGIOGRAPHY;  Surgeon: Belva Crome, MD;  Location: [facility];  Service: Cardiovascular;  Laterality: N/A;   ROTATOR CUFF REPAIR Right 2016   THROAT SURGERY  04/20/2018   TRANSRECTAL ULTRASOUND PROSTATE BX  05-23-2005  &  04-19-2001   TRANSTHORACIC ECHOCARDIOGRAM  09/2017   EF 45-50 % (previously reported as 40 to 45%).  Incoordinate septal motion with mild LVH.  GR 1 DD.  Aortic sclerosis but no stenosis.   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  Tobacco Use   Smoking status: Former    Packs/day: 2.00    Years: 20.00    Pack years: 40.00    Types: Cigarettes    Quit date: 03/28/1980    Years since quitting: 40.9   Smokeless tobacco: Never  Vaping Use   Vaping Use: Never used  Substance  and Sexual Activity   Alcohol use: No    Alcohol/week: 0.0 standard drinks   Drug use: No   Sexual activity: Not on file

## 2021-03-30 ENCOUNTER — Ambulatory Visit
Admission: RE | Admit: 2021-03-30 | Discharge: 2021-03-30 | Disposition: A | Discharge status: Home / Self Care | Payer: Medicare HMO | Source: Ambulatory Visit | Attending: Orthopedic Surgery | Admitting: Orthopedic Surgery

## 2021-03-30 ENCOUNTER — Other Ambulatory Visit: Payer: Self-pay

## 2021-03-30 DIAGNOSIS — S82145A Nondisplaced bicondylar fracture of left tibia, initial encounter for closed fracture: Secondary | ICD-10-CM | POA: Diagnosis not present

## 2021-03-30 DIAGNOSIS — M79605 Pain in left leg: Secondary | ICD-10-CM

## 2021-03-30 DIAGNOSIS — M7981 Nontraumatic hematoma of soft tissue: Secondary | ICD-10-CM | POA: Diagnosis not present

## 2021-03-30 DIAGNOSIS — S83242A Other tear of medial meniscus, current injury, left knee, initial encounter: Secondary | ICD-10-CM | POA: Diagnosis not present

## 2021-04-02 ENCOUNTER — Encounter: Payer: Self-pay | Admitting: Family Medicine

## 2021-04-02 DIAGNOSIS — C61 Malignant neoplasm of prostate: Secondary | ICD-10-CM | POA: Insufficient documentation

## 2021-04-07 ENCOUNTER — Other Ambulatory Visit: Payer: Self-pay

## 2021-04-07 ENCOUNTER — Ambulatory Visit (INDEPENDENT_AMBULATORY_CARE_PROVIDER_SITE_OTHER): Payer: Medicare HMO | Admitting: Orthopedic Surgery

## 2021-04-07 DIAGNOSIS — M25562 Pain in left knee: Secondary | ICD-10-CM

## 2021-04-09 DIAGNOSIS — G5602 Carpal tunnel syndrome, left upper limb: Secondary | ICD-10-CM | POA: Diagnosis not present

## 2021-04-11 ENCOUNTER — Encounter: Payer: Self-pay | Admitting: Orthopedic Surgery

## 2021-04-11 NOTE — Progress Notes (Signed)
Office Visit Note   Patient: [name] [name]           Date of Birth: 1945/11/18           MRN: [id number] Visit Date: 04/07/2021 Requested by: [name], MD [address] [place],  [place] [address] PCP: [name], MD  Subjective: Chief Complaint  Patient presents with   Other     Scan review    HPI: [name] [name] is a 76 year old patient with left calf swelling.  Presents for evaluation after MRI tib-fib is reviewed.  Had some atypical cystic changes in the back of that calf.  The scan is reviewed.  Reviewed it with radiologist x2.  This does look like its a resolving hematoma.  He is much less symptomatic after injection into the left knee last office visit.              ROS: All systems reviewed are negative as they relate to the chief complaint within the history of present illness.  Patient denies  fevers or chills.   Assessment & Plan: Visit Diagnoses:  1. Left knee pain, unspecified chronicity     Plan: Impression is left knee pain with symptoms have improved since injection.  Cystic structure which caused some concern last clinic visit appears to be resolving hematoma.  No intervention required at this time.  No effusion in the knee today which is a good sign.  Follow-up as needed  Follow-Up Instructions: Return if symptoms worsen or fail to improve.   Orders:  No orders of the defined types were placed in this encounter.  No orders of the defined types were placed in this encounter.     Procedures: No procedures performed   Clinical Data: No additional findings.  Objective: Vital Signs: There were no vitals taken for this visit.  Physical Exam:   Constitutional: Patient appears well-developed HEENT:  Head: Normocephalic Eyes:EOM are normal Neck: Normal range of motion Cardiovascular: Normal rate Pulmonary/chest: Effort normal Neurologic: Patient is alert Skin: Skin is warm Psychiatric: Patient has normal mood and affect   Ortho  Exam: Ortho exam demonstrates full active and passive range of motion of the knee with no effusion collateral and cruciate ligaments are stable extensor mechanism is intact patient has no tenderness to palpation over cystic structure on the posterior aspect of the medial head of the gastroc.  This is the area consistent with resolving hematoma.  Specialty Comments:  No specialty comments available.  Imaging: No results found.   PMFS History: Patient Active Problem List   Diagnosis Date Noted   Prostate cancer ([place]) 04/02/2021   Acute on chronic HFrEF (heart failure with reduced ejection fraction) ([place]) 05/20/2020   Claudication ([place]) 09/01/2019   S/P cervical spinal fusion 04/20/2018   Paresthesia 01/10/2018   Neck pain 01/10/2018   LBBB (left bundle branch block) 09/07/2017   Other meniscus derangements, posterior horn of medial meniscus, left knee, insufficency fracture medial tibial plateau 04/07/2017   Closed nondisplaced fracture of lateral condyle of left tibia with routine healing 04/07/2017   Status post arthroscopy of left knee 04/07/2017   Insomnia 09/14/2015   Hyperlipidemia with target LDL less than 100 09/11/2014   Rotator cuff tendinitis 04/28/2014   Obesity (BMI 30-39.9) 12/29/2013   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   Exertional shortness of breath 06/26/2012   Polyp of colon, adenomatous 04/27/2012   Elevated PSA 10/20/2011   Hypertriglyceridemia 07/08/2011   Nonischemic cardiomyopathy ([place]) 09/04/2009  Past Medical History:  Diagnosis Date   Arthritis    BPH (benign prostatic hypertrophy)    Coronary atherosclerosis of native coronary vessel    CATH 2004--  MINIMAL NONOBSTRUCTIVE LAD/  NORMAL EF/    CARDIAC CT  03/2008  NO SIGNIFICANT DISEASE  AND  nlef   Elevated PSA    Eye problem 03/06/2016   [name] retinal detachment left eye, no sx done   Headache    History of gout    pt 05-20-2013 states stable    History of melanoma excision     2011-- LEFT UPPER BACK   Hyperlipemia    Hypertriglyceridemia    Left bundle branch block    08/2017   Melanoma ([place]) 2011   back   Nonischemic cardiomyopathy ([place])    09-04-2009  --  2D echo-EF 40-45%, Global Hypokinesis - 09/2017 -> EF 45-50%    Numbness    hands   Personal history of arthritis    Personal history of other diseases of circulatory system     Family History  Problem Relation Age of Onset   Arthritis Mother    COPD Father    Cancer Father        lung cancer   Cancer Sister    Cardiomyopathy Other        idiopathic. trivial disease 2004 cath, 2010 cardiac CT - no sig. dz, nl LV fxn. cards: Little   Colon cancer Neg Hx     Past Surgical History:  Procedure Laterality Date   ANTERIOR CERVICAL DECOMP/DISCECTOMY FUSION N/A 04/20/2018   Procedure: Anterior Cervical Decompression Fusion - Cervical Three-Cervical Four - Cervical Four-Cervical Five - Cervical Five-Cervical Six;  Surgeon: Eustace Moore, MD;  Location: [facility];  Service: Neurosurgery;  Laterality: N/A;  Anterior Cervical Decompression Fusion - Cervical Three-Cervical Four - Cervical Four-Cervical Five - Cervical Five-Cervical Six   APPENDECTOMY  AS CHILD   BACK SURGERY     CARDIAC CATHETERIZATION  05/23/2002  &  05-23-1998   minimal irregularities in the LAD/  EF 50%  -   DR AL LITTLE   CARDIOVASCULAR STRESS TEST  10-05-1998   MILD GLOBAL HYPOKINESIS AND ISCHEMIAIN ANTEROSEPTAL  AT [facility]/ EF 42%   CARPAL TUNNEL RELEASE Right    EXCISION MELANOMA LEFT UPPER BACK  10-14-2009   eye lid surgery Bilateral 2018   KNEE ARTHROSCOPY WITH SUBCHONDROPLASTY Left 04/07/2017   Procedure: LEFT KNEE ARTHROSCOPY WITH PARTIAL MEDIAL MENISCECTOMY AND MEDIAL TIBIAL SUBCHONDROPLASTY;  Surgeon: Mcarthur Rossetti, MD;  Location: [facility];  Service: Orthopedics;  Laterality: Left;   knee injections Bilateral    LUMBAR DISC SURGERY  09-12-2000   left  L5 -- S1   PROSTATE BIOPSY N/A 05/22/2013   Procedure: BIOPSY TRANSRECTAL  ULTRASONIC PROSTATE (TUBP);  Surgeon: [name], MD;  Location: [facility];  Service: Urology;  Laterality: N/A;   RIGHT/LEFT HEART CATH AND CORONARY ANGIOGRAPHY N/A 06/25/2020   Procedure: RIGHT/LEFT HEART CATH AND CORONARY ANGIOGRAPHY;  Surgeon: Belva Crome, MD;  Location: [facility];  Service: Cardiovascular;  Laterality: N/A;   ROTATOR CUFF REPAIR Right 2016   THROAT SURGERY  04/20/2018   TRANSRECTAL ULTRASOUND PROSTATE BX  05-23-2005  &  04-19-2001   TRANSTHORACIC ECHOCARDIOGRAM  09/2017   EF 45-50 % (previously reported as 40 to 45%).  Incoordinate septal motion with mild LVH.  GR 1 DD.  Aortic sclerosis but no stenosis.   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  Tobacco Use   Smoking status: Former    Packs/day: 2.00    Years: 20.00    Pack years: 40.00    Types: Cigarettes    Quit date: 03/28/1980    Years since quitting: 41.0   Smokeless tobacco: Never  Vaping Use   Vaping Use: Never used  Substance and Sexual Activity   Alcohol use: No    Alcohol/week: 0.0 standard drinks   Drug use: No   Sexual activity: Not on file

## 2021-04-12 ENCOUNTER — Telehealth: Payer: Self-pay | Admitting: Radiation Oncology

## 2021-04-23 ENCOUNTER — Other Ambulatory Visit: Payer: Self-pay | Admitting: Family Medicine

## 2021-04-23 DIAGNOSIS — G5602 Carpal tunnel syndrome, left upper limb: Secondary | ICD-10-CM | POA: Diagnosis not present

## 2021-04-23 DIAGNOSIS — Z4789 Encounter for other orthopedic aftercare: Secondary | ICD-10-CM | POA: Diagnosis not present

## 2021-04-28 ENCOUNTER — Other Ambulatory Visit: Payer: Self-pay | Admitting: General Practice

## 2021-04-29 ENCOUNTER — Encounter: Payer: Self-pay | Admitting: Cardiology

## 2021-04-29 NOTE — Progress Notes (Unsigned)
Primary Care Provider: [name], MD Cardiologist: Glenetta Hew, MD Electrophysiologist: None  Clinic Note: No chief complaint on file.   ===================================  ASSESSMENT/PLAN   Problem List Items Addressed This Visit       Cardiology Problems   Nonischemic cardiomyopathy ([place]) (Chronic)   Hyperlipidemia with target LDL less than 100 - Primary (Chronic)   LBBB (left bundle branch block) (Chronic)     Other   Obesity (BMI 30-39.9) (Chronic)    ===================================  HPI:    [name] [name] is a 76 y.o. male with a PMH notable for NICM (EF ~45%-reduced to 20% March 2022), LBBB and Mild AORTIC STENOSIS who presents today for routine follow-up.  I last saw Mr. Enslin January 17, 2021 for hospital follow-up-urgent care evaluation for pulm edem -> he was started on Lasix during that urgent care visit.  When I saw him back he is a still better after he took 3 days of Lasix.  Bleeding was fully improved and the swelling was gone better.  Still noted exertional dyspnea but no chest pain or pressure.  Exertional dyspnea notably improved.  Mild edema.  No change in myalgias with statin holiday. Restarted rosuvastatin 10 mg.  Provided prescription for for furosemide 40 mg tabs as needed up to 2 days a week. New echo ordered. =  EF dropped to ~20% =>  Telemedicine follow-up with Coletta Memos, NP, scheduled for cardiac catheterization June 25, 2020  Recent Hospitalizations:  06/25/2020-cath ER visit 03/06/2021: Left knee popliteal cyst  Post cath follow-up July 22, 2020 with Odie Sera, NP -> feeling fairly well.  Enjoyed hunting and then walking 2 miles a day on days he does not.  He did note some exertional dyspnea with increased level of activity.  Well-controlled blood pressures if not borderline low. ->  Have been converted to carvedilol, with allergy to ACE inhibitors, not started on ARB/ARNI. => Discussed EP evaluation for ICD.  3  month f/u Echo following Med titration.   Seen by Dr. Cristopher Peru from EP on Aug 25, 2020.  Discussed potential BiV ICD (CRT-D) if no improvement in EF.  Added hydralazine/nitrate along with Aldactone  Corian Handley was last seen on October 28, 2018 by Coletta Memos, NP.  Patient doing well.  Very physically active.  Walking about a mile every day.  Splitting wood and stacking wood.  Low-salt low sugar diet. At this point, he deferred ICD hopes to wait 6 months  Reviewed  CV studies:    The following studies were reviewed today: (if available, images/films reviewed: From Epic Chart or Care Everywhere) TTE 06/16/2020: Severely reduced EF <20%.  Moderate severely dilated LV.  GR 2 DD.  Elevated LVEDP.  Mild LA dilation.  Mildly reduced RV function.  Mild MR.  Mild aortic valve calcification R&LHC 06/25/2020: Normal coronaries, left dominant system.  Moderately elevated EDP of 23 mmHg-with echo documented severely reduced function.  Mild pulm hypertension-secondary.  PCWP 19 mmHg, LVEDP 23 mmHg.Marland Kitchen Recommended Entresto, SGLT2 inhibitor, beta-blocker and possible MRA. Follow-up echo 10/06/2020: Severe septal and lateral wall dyssynchrony due to LBBB.  EF estimated 25 to 25%.  Severely decreased systolic function global HK.  GR 1 DD with unable assess PAP.  Mild LA dilation.  AOV sclerosis with no stenosis.  Normal IVC. Left lower extremity venous duplex 03/17/2019: No DVT.  18 x 2.5 x 4.6 cm complex fluid collection in the left popliteal fossa extending the calf cyst versus hematoma. . Interval History:  [name]   CV Review of Symptoms (Summary) Cardiovascular ROS: {roscv:310661}  REVIEWED OF SYSTEMS   ROS Mild malaise/fatigue.  Improved cough, exertional dyspnea Increased urine output/urinary frequency with Lasix... 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  I have reviewed and (if needed) personally updated the patient's  problem list, medications, allergies, past medical and surgical history, social and family history.   PAST MEDICAL HISTORY   Past Medical History:  Diagnosis Date   Arthritis    BPH (benign prostatic hypertrophy)    Coronary atherosclerosis of native coronary vessel    CATH 2004--  MINIMAL NONOBSTRUCTIVE LAD/  NORMAL EF/    CARDIAC CT  03/2008  NO SIGNIFICANT DISEASE  AND  nlef   Elevated PSA    Eye problem 03/06/2016   [name] retinal detachment left eye, no sx done   Headache    History of gout    pt 05-20-2013 states stable    History of melanoma excision    2011-- LEFT UPPER BACK   Hyperlipemia    Hypertriglyceridemia    Left bundle branch block    08/2017   Melanoma ([place]) 2011   back   Nonischemic cardiomyopathy ([place])    09-04-2009  --  2D echo-EF 40-45%, Global Hypokinesis - 09/2017 -> EF 45-50%    Numbness    hands   Personal history of arthritis    Personal history of other diseases of circulatory system     PAST SURGICAL HISTORY   Past Surgical History:  Procedure Laterality Date   ANTERIOR CERVICAL DECOMP/DISCECTOMY FUSION N/A 04/20/2018   Procedure: Anterior Cervical Decompression Fusion - Cervical Three-Cervical Four - Cervical Four-Cervical Five - Cervical Five-Cervical Six;  Surgeon: Eustace Moore, MD;  Location: [facility];  Service: Neurosurgery;  Laterality: N/A;  Anterior Cervical Decompression Fusion - Cervical Three-Cervical Four - Cervical Four-Cervical Five - Cervical Five-Cervical Six   APPENDECTOMY  AS CHILD   BACK SURGERY     CARDIAC CATHETERIZATION  05/23/2002  &  05-23-1998   minimal irregularities in the LAD/  EF 50%  -   DR AL LITTLE   CARDIOVASCULAR STRESS TEST  10-05-1998   MILD GLOBAL HYPOKINESIS AND ISCHEMIAIN ANTEROSEPTAL  AT [facility]/ EF 42%   CARPAL TUNNEL RELEASE Right    EXCISION MELANOMA LEFT UPPER BACK  10-14-2009   eye lid surgery Bilateral 2018   KNEE ARTHROSCOPY WITH SUBCHONDROPLASTY Left 04/07/2017   Procedure: LEFT KNEE ARTHROSCOPY  WITH PARTIAL MEDIAL MENISCECTOMY AND MEDIAL TIBIAL SUBCHONDROPLASTY;  Surgeon: Mcarthur Rossetti, MD;  [facility];  Service: Orthopedics;  Laterality: Left;   knee injections Bilateral    LUMBAR DISC SURGERY  09-12-2000   left  L5 -- S1   PROSTATE BIOPSY N/A 05/22/2013   Procedure: BIOPSY TRANSRECTAL ULTRASONIC PROSTATE (TUBP);  Surgeon: [name], MD;  Location: [facility];  Service: Urology;  Laterality: N/A;   RIGHT/LEFT HEART CATH AND CORONARY ANGIOGRAPHY N/A 06/25/2020   Procedure: RIGHT/LEFT HEART CATH AND CORONARY ANGIOGRAPHY;  Surgeon: Belva Crome, MD;  Location: [facility];  Service: Cardiovascular;  Laterality: N/A;   ROTATOR CUFF REPAIR Right 2016   THROAT SURGERY  04/20/2018   TRANSRECTAL ULTRASOUND PROSTATE BX  05-23-2005  &  04-19-2001   TRANSTHORACIC ECHOCARDIOGRAM  09/2017   EF 45-50 % (previously reported as 40 to 45%).  Incoordinate septal motion with mild LVH.  GR 1 DD.  Aortic sclerosis but no  stenosis.    Immunization History  Administered Date(s) Administered   Hepatitis B 01/14/2011   Influenza Split 01/14/2011, 01/18/2012   Influenza, High Dose Seasonal PF 01/10/2018, 12/19/2018   Influenza,inj,Quad PF,6+ Mos 02/04/2013, 12/26/2013, 01/23/2015, 01/22/2016, 12/16/2016   Influenza-Unspecified 12/27/2019   PFIZER(Purple Top)SARS-COV-2 Vaccination 04/30/2019, 05/22/2019, 12/31/2019, 07/22/2020   Pneumococcal Conjugate-13 05/06/2014   Pneumococcal Polysaccharide-23 03/28/2004, 04/29/2009   Tdap 08/11/2009   Zoster, Live 06/11/2013    MEDICATIONS/ALLERGIES   No outpatient medications have been marked as taking for the 04/30/21 encounter (Appointment) with Leonie Man, MD.   Current Facility-Administered Medications for the 04/30/21 encounter (Appointment) with Leonie Man, MD  Medication   sodium chloride flush (NS) 0.9 % injection 3 mL    Allergies  Allergen Reactions   Nitroglycerin Anaphylaxis     "Cardiologist suggested he shouldn't take this med because it could kill him with his heart condition. Lowers BP"   Crestor [Rosuvastatin] Other (See Comments)    Body aches   Ace Inhibitors Swelling   Lovastatin Nausea Only   Solarcaine [Benzocaine] Dermatitis    SOCIAL HISTORY/FAMILY HISTORY   Reviewed in Epic:  Pertinent findings:  Social History   Tobacco Use   Smoking status: Former    Packs/day: 2.00    Years: 20.00    Pack years: 40.00    Types: Cigarettes    Quit date: 03/28/1980    Years since quitting: 41.1   Smokeless tobacco: Never  Vaping Use   Vaping Use: Never used  Substance Use Topics   Alcohol use: No    Alcohol/week: 0.0 standard drinks   Drug use: No   Social History   Social History Narrative   He walks on a relatively regular basis about 15 minutes at a time mostly to the discomfort. He previously had walked up a 30-40 minutes a time without problems. Education: [employer].   Lives alone.   Right-handed.   One cup coffee daily.    OBJCTIVE -PE, EKG, labs   Wt Readings from Last 3 Encounters:  02/26/21 185 lb 12.8 oz (84.3 kg)  12/29/20 191 lb 9.6 oz (86.9 kg)  10/27/20 188 lb 3.2 oz (85.4 kg)    Physical Exam: There were no vitals taken for this visit. Physical Exam  Well-groomed.  Healthy-appearing.  Occasional extrasystole.  Distant heart sounds.  Split S2.  Decreased breath sounds in bilateral lower fields.  Mild bibasilar crackles.  Trivial edema.   Adult ECG Report  Rate: *** ;  Rhythm: {rhythm:17366};   Narrative Interpretation: ***  Recent Labs:  ***  Lab Results  Component Value Date   CHOL 119 02/26/2021   HDL 40.40 02/26/2021   LDLCALC 60 02/26/2021   TRIG 95.0 02/26/2021   CHOLHDL 3 02/26/2021   Lab Results  Component Value Date   CREATININE 1.23 03/06/2021   BUN 24 (H) 03/06/2021   NA 137 03/06/2021   K 3.9 03/06/2021   CL 103 03/06/2021   CO2 27 03/06/2021   CBC Latest Ref Rng & Units 03/06/2021 12/29/2020  06/25/2020  WBC 4.0 - 10.5 K/uL 10.6(H) 8.1 -  Hemoglobin 13.0 - 17.0 g/dL 12.5(L) 13.1 12.2(L)  Hematocrit 39.0 - 52.0 % 37.6(L) 40.0 36.0(L)  Platelets 150 - 400 K/uL 147(L) 128.0(L) -    Lab Results  Component Value Date   HGBA1C 6.5 02/26/2021   Lab Results  Component Value Date   TSH 1.280 08/08/2017    ==================================================  COVID-19 Education: The signs and symptoms of COVID-19  were discussed with the patient and how to seek care for testing (follow up with PCP or arrange E-visit).    I spent a total of ***minutes with the patient spent in direct patient consultation.  Additional time spent with chart review  / charting (studies, outside notes, etc): *** min Total Time: *** min  Current medicines are reviewed at length with the patient today.  (+/- concerns) ***  This visit occurred during the SARS-CoV-2 public health emergency.  Safety protocols were in place, including screening questions prior to the visit, additional usage of staff PPE, and extensive cleaning of exam room while observing appropriate contact time as indicated for disinfecting solutions.  Notice: This dictation was prepared with Dragon dictation along with smart phrase technology. Any transcriptional errors that result from this process are unintentional and may not be corrected upon review.  Studies Ordered:   No orders of the defined types were placed in this encounter.   Patient Instructions / Medication Changes & Studies & Tests Ordered   There are no Patient Instructions on file for this visit.     Glenetta Hew, M.D., M.S. Interventional Cardiologist   Pager # [phone] Phone # [phone] [address] [place], [place] [address]   Thank you for choosing Heart[facility] at [facility]!!

## 2021-04-30 ENCOUNTER — Encounter: Payer: Self-pay | Admitting: Cardiology

## 2021-04-30 ENCOUNTER — Ambulatory Visit: Payer: Medicare HMO | Admitting: Cardiology

## 2021-04-30 ENCOUNTER — Other Ambulatory Visit: Payer: Self-pay

## 2021-04-30 VITALS — BP 126/64 | HR 68 | Ht 69.0 in | Wt 185.8 lb

## 2021-04-30 DIAGNOSIS — I5022 Chronic systolic (congestive) heart failure: Secondary | ICD-10-CM | POA: Diagnosis not present

## 2021-04-30 DIAGNOSIS — E669 Obesity, unspecified: Secondary | ICD-10-CM

## 2021-04-30 DIAGNOSIS — I447 Left bundle-branch block, unspecified: Secondary | ICD-10-CM

## 2021-04-30 DIAGNOSIS — E785 Hyperlipidemia, unspecified: Secondary | ICD-10-CM

## 2021-04-30 DIAGNOSIS — I428 Other cardiomyopathies: Secondary | ICD-10-CM

## 2021-04-30 MED ORDER — HYDRALAZINE HCL 25 MG PO TABS: 25.0000 mg | ORAL_TABLET | Freq: Two times a day (BID) | ORAL | 6 refills | Status: DC

## 2021-04-30 NOTE — Patient Instructions (Addendum)
Medication Instructions:    Start taking these new medication together one hour after taking your Carvedilol daily  10 mg  Hydralazine twice a day      If after a 2 week start if dizziness is occurring then stop taking Hydralazine.   *If you need a refill on your cardiac medications before your next appointment, please call your pharmacy*   Lab Work:  Not needed   Testing/Procedures:  Not needed  Follow-Up: At [facility], you and your health needs are our priority.  As part of our continuing mission to provide you with exceptional heart care, we have created designated Provider Care Teams.  These Care Teams include your primary Cardiologist (physician) and Advanced Practice Providers (APPs -  Physician Assistants and Nurse Practitioners) who all work together to provide you with the care you need, when you need it.     Your next appointment:   4 month(s) June 2023 - middle of the month   The format for your next appointment:   In Person  Provider:   Glenetta Hew, MD

## 2021-05-04 NOTE — Progress Notes (Signed)
GU Location of Tumor / Histology: Prostate Ca  If Prostate Cancer, Gleason Score is (4 + 3) and PSA is (10.8 as of 12/2020).  Biopsies  Dr. Lovena Neighbours      Past/Anticipated interventions by urology, if any:     Past/Anticipated interventions by medical oncology, if any:   Weight changes, if any: Intentional 10 lbs.  IPSS:  18 SHIM:  21  Bowel/Bladder complaints, if any:  No bowel or bladder issues at this time.  Nausea/Vomiting, if any:   No  Pain issues, if any:  0/10  SAFETY ISSUES: Prior radiation?   No Pacemaker/ICD?   No Possible current pregnancy? Male Is the patient on methotrexate?   No  Current Complaints / other details:  Need more information on treatment options.

## 2021-05-05 ENCOUNTER — Ambulatory Visit
Admission: RE | Admit: 2021-05-05 | Discharge: 2021-05-05 | Disposition: A | Discharge status: Home / Self Care | Payer: Medicare HMO | Source: Ambulatory Visit | Attending: Radiation Oncology | Admitting: Radiation Oncology

## 2021-05-05 ENCOUNTER — Other Ambulatory Visit: Payer: Self-pay

## 2021-05-05 VITALS — BP 126/57 | HR 72 | Temp 96.7°F | Resp 18 | Ht 67.0 in | Wt 188.5 lb

## 2021-05-05 DIAGNOSIS — M129 Arthropathy, unspecified: Secondary | ICD-10-CM | POA: Diagnosis not present

## 2021-05-05 DIAGNOSIS — C61 Malignant neoplasm of prostate: Secondary | ICD-10-CM | POA: Insufficient documentation

## 2021-05-05 DIAGNOSIS — E781 Pure hyperglyceridemia: Secondary | ICD-10-CM | POA: Insufficient documentation

## 2021-05-05 DIAGNOSIS — Z801 Family history of malignant neoplasm of trachea, bronchus and lung: Secondary | ICD-10-CM | POA: Diagnosis not present

## 2021-05-05 DIAGNOSIS — I2510 Atherosclerotic heart disease of native coronary artery without angina pectoris: Secondary | ICD-10-CM | POA: Insufficient documentation

## 2021-05-05 DIAGNOSIS — Z79899 Other long term (current) drug therapy: Secondary | ICD-10-CM | POA: Diagnosis not present

## 2021-05-05 DIAGNOSIS — E785 Hyperlipidemia, unspecified: Secondary | ICD-10-CM | POA: Insufficient documentation

## 2021-05-05 DIAGNOSIS — Z7982 Long term (current) use of aspirin: Secondary | ICD-10-CM | POA: Insufficient documentation

## 2021-05-05 DIAGNOSIS — Z85820 Personal history of malignant melanoma of skin: Secondary | ICD-10-CM | POA: Insufficient documentation

## 2021-05-05 DIAGNOSIS — R972 Elevated prostate specific antigen [PSA]: Secondary | ICD-10-CM | POA: Diagnosis not present

## 2021-05-05 NOTE — Progress Notes (Signed)
Radiation Oncology         ([phone]) [phone] ________________________________  Initial Outpatient Consultation  Name: [name] [name] MRN: [id number]  Date: 05/05/2021  DOB: Feb 09, 1946  JG:GEZMOQ, Ranell Patrick, MD  Davis Gourd*   REFERRING PHYSICIAN: Davis Gourd*  DIAGNOSIS: 76 y.o. gentleman with Stage T1c adenocarcinoma of the prostate with Gleason score of 3+4, and PSA of 10.8.    ICD-10-CM   1. Prostate cancer [facility])  C61       HISTORY OF PRESENT ILLNESS: [name] [name] is a 76 y.o. male with a diagnosis of prostate cancer. He has a longstanding history of elevated PSA, previously followed with Dr. Risa Grill in urology and more recently, with Dr. Lovena Neighbours,  digital rectal examinations have remained benign over the years.  He has had 3 previous negative biopsies with Dr. Risa Grill in the past and his PSA has continued to fluctuate over the years.  His PSA was 8.8 in 2019, 6.7 in September 2020, 10.3 in October 2021, 6.8 in April 2022 and 10.8 in October 2022.  He had a prostate MRI on 01/26/2021 which showed a 1.3 cm PI-RADS 4 lesion in the left posterior lateral apex peripheral zone with possible mild extracapsular extension but no evidence of pelvic lymphadenopathy or osseous metastatic disease in the pelvis.  The patient proceeded to MRI fusion transrectal ultrasound with 16 biopsies of the prostate on 02/25/2021.  The prostate volume measured 137 cc.  Out of 16 core biopsies, 5 were positive.  The maximum Gleason score was 3+4, and this was seen in 3 out of 4 MRI ROI lesion samples as well as a sample from the left apex.  There was also Gleason 3+3 disease in the left apex lateral. At his most recent office visit in 02/2021, he was started on finasteride in addition to the Flomax he was already taking for BPH with BOO. He has not noticed much, if any, improvement in LUTS since starting the finasteride but is taking it daily as prescribed.  The patient reviewed the biopsy  results with his urologist and he has kindly been referred today for discussion of potential radiation treatment options.   PREVIOUS RADIATION THERAPY: No  PAST MEDICAL HISTORY:  Past Medical History:  Diagnosis Date   Arthritis    BPH (benign prostatic hypertrophy)    Coronary atherosclerosis of native coronary vessel    CATH 2004--  MINIMAL NONOBSTRUCTIVE LAD/  NORMAL EF/    CARDIAC CT  03/2008  NO SIGNIFICANT DISEASE  AND  nlef   Elevated PSA    Eye problem 03/06/2016   [name] retinal detachment left eye, no sx done   Headache    History of gout    pt 05-20-2013 states stable    History of melanoma excision    2011-- LEFT UPPER BACK   Hyperlipemia    Hypertriglyceridemia    Left bundle branch block    08/2017   Melanoma ([place]) 2011   back   Nonischemic cardiomyopathy ([place])    09-04-2009  --  2D echo-EF 40-45%, Global Hypokinesis - 09/2017 -> EF 45-50%    Numbness    hands   Personal history of arthritis    Personal history of other diseases of circulatory system       PAST SURGICAL HISTORY: Past Surgical History:  Procedure Laterality Date   ANTERIOR CERVICAL DECOMP/DISCECTOMY FUSION N/A 04/20/2018   Procedure: Anterior Cervical Decompression Fusion - Cervical Three-Cervical Four - Cervical Four-Cervical Five - Cervical Five-Cervical Six;  Surgeon: Eustace Moore, MD;  Location: [place];  Service: Neurosurgery;  Laterality: N/A;  Anterior Cervical Decompression Fusion - Cervical Three-Cervical Four - Cervical Four-Cervical Five - Cervical Five-Cervical Six   APPENDECTOMY  AS CHILD   BACK SURGERY     CARDIAC CATHETERIZATION  05/23/2002  &  05-23-1998   minimal irregularities in the LAD/  EF 50%  -   DR AL LITTLE   CARDIOVASCULAR STRESS TEST  10/05/1998   MILD GLOBAL HYPOKINESIS AND ISCHEMIAIN ANTEROSEPTAL  AT APEX/ EF 42%   CARPAL TUNNEL RELEASE Right    EXCISION MELANOMA LEFT UPPER BACK  10/14/2009   eye lid surgery Bilateral 2018   KNEE ARTHROSCOPY WITH  SUBCHONDROPLASTY Left 04/07/2017   Procedure: LEFT KNEE ARTHROSCOPY WITH PARTIAL MEDIAL MENISCECTOMY AND MEDIAL TIBIAL SUBCHONDROPLASTY;  Surgeon: Mcarthur Rossetti, MD;  Location: [facility];  Service: Orthopedics;  Laterality: Left;   knee injections Bilateral    LUMBAR DISC SURGERY  09/12/2000   left  L5 -- S1   PROSTATE BIOPSY N/A 05/22/2013   Procedure: BIOPSY TRANSRECTAL ULTRASONIC PROSTATE (TUBP);  Surgeon: [name], MD;  Location: [facility];  Service: Urology;  Laterality: N/A;   RIGHT/LEFT HEART CATH AND CORONARY ANGIOGRAPHY N/A 06/25/2020   Procedure: RIGHT/LEFT HEART CATH AND CORONARY ANGIOGRAPHY;  Surgeon: Belva Crome, MD;  Location: [place] [facility];:; Normal coronary arteries; LVEDP 23 mmHg 370.  19 mmHg.   ROTATOR CUFF REPAIR Right 2016   THROAT SURGERY  04/20/2018   TRANSRECTAL ULTRASOUND PROSTATE BX  05-23-2005  &  04-19-2001   TRANSTHORACIC ECHOCARDIOGRAM  09/2017   EF 45-50 % (previously reported as 40 to 45%).  Incoordinate septal motion with mild LVH.  GR 1 DD.  Aortic sclerosis but no stenosis.   TRANSTHORACIC ECHOCARDIOGRAM  10/06/2020   Severe septal and lateral wall    FAMILY HISTORY:  Family History  Problem Relation Age of Onset   Arthritis Mother    COPD Father    Cancer Father        lung cancer   Cancer Sister    Cardiomyopathy Other        idiopathic. trivial disease 2004 cath, 2010 cardiac CT - no sig. dz, nl LV fxn. 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  Tobacco Use   Smoking status: Former    Packs/day: 2.00    Years: 20.00    Pack years: 40.00    Types: Cigarettes    Quit date: 03/28/1980    Years  since quitting: 41.1   Smokeless tobacco: Never  Vaping Use   Vaping Use: Never used  Substance and Sexual Activity   Alcohol use: No    Alcohol/week: 0.0 standard drinks   Drug use: No   Sexual activity: Not on file  Other Topics Concern   Not on file  Social History Narrative   He walks on a relatively regular basis about 15 minutes at a time mostly to the discomfort. He previously had walked up a 30-40 minutes a time without problems. Education: [employer].   Lives alone.   Right-handed.   One  cup coffee daily.   Social Determinants of Health   Financial Resource Strain: Not on file  Food Insecurity: Not on file  Transportation Needs: Not on file  Physical Activity: Not on file  Stress: Not on file  Social Connections: Not on file  Intimate Partner Violence: Not on file    ALLERGIES: Nitroglycerin, Crestor [rosuvastatin], Ace inhibitors, Lovastatin, and Solarcaine [benzocaine]  MEDICATIONS:  Current Outpatient Medications  Medication Sig Dispense Refill   aspirin EC 81 MG tablet Take 1 tablet (81 mg total) by mouth daily.     carvedilol (COREG) 3.125 MG tablet TAKE 1 TABLET (3.125 MG TOTAL) BY MOUTH 2 (TWO) TIMES DAILY WITH A MEAL. 180 tablet 1   colchicine 0.6 MG tablet 2 pills initially with flare, then up to BID for few days if needed. 20 tablet 0   furosemide (LASIX) 40 MG tablet TAKE 1 TAB TWICE A WEEK , MAY TAKE AN ADDITIONAL 1 TAB IF WEIGHT IS GREATER THAN 3 LBS AND OR INCREASE SHORTNESS OF BREATH ,OR SWELLING 30 tablet 6   gabapentin (NEURONTIN) 300 MG capsule Take 1-2 capsules (300-600 mg total) by mouth 3 (three) times daily as needed (pain). 751 capsule 1   Garlic 0258 MG CAPS Take 1,000 mg by mouth daily.     hydrALAZINE (APRESOLINE) 25 MG tablet Take 1 tablet (25 mg total) by mouth in the morning and at bedtime. 60 tablet 6   magnesium oxide (MAG-OX) 400 MG tablet Take 400 mg by mouth daily.     Multiple Vitamin (MULTIVITAMIN WITH MINERALS) TABS tablet Take 1  tablet by mouth daily. One-A-Day Multivitamin     Omega-3 Fatty Acids (FISH OIL) 500 MG CAPS Take 500 mg by mouth daily.     rosuvastatin (CRESTOR) 10 MG tablet Take 1 tablet (10 mg total) by mouth daily. 90 tablet 3   sildenafil (VIAGRA) 100 MG tablet 1/2 to 1 tablet up to each day as needed. 10 tablet 5   tamsulosin (FLOMAX) 0.4 MG CAPS capsule TAKE 1 CAPSULE EVERY DAY 60 capsule 5   Turmeric 500 MG TABS Take 500 mg by mouth daily.     vitamin B-12 (CYANOCOBALAMIN) 500 MCG tablet Take 500 mcg by mouth daily.     Zinc 50 MG TABS Take 50 mg by mouth daily.     Current Facility-Administered Medications  Medication Dose Route Frequency Provider Last Rate Last Admin   sodium chloride flush (NS) 0.9 % injection 3 mL  3 mL Intravenous Q12H Cleaver, Jossie Ng, NP        REVIEW OF SYSTEMS:  On review of systems, the patient reports that he is doing well overall. He denies any chest pain, shortness of breath, cough, fevers, chills, night sweats, unintended weight changes. He denies any bowel disturbances, and denies abdominal pain, nausea or vomiting. He denies any new musculoskeletal or joint aches or pains. His IPSS was 18, indicating moderate-severe urinary symptoms with urgency, hesitancy, intermittency, incomplete bladder emptying and nocturia x3 despite Flomax. His SHIM was 21, indicating he does not have erectile dysfunction. A complete review of systems is obtained and is otherwise negative.    PHYSICAL EXAM:  Wt Readings from Last 3 Encounters:  04/30/21 185 lb 12.8 oz (84.3 kg)  02/26/21 185 lb 12.8 oz (84.3 kg)  12/29/20 191 lb 9.6 oz (86.9 kg)   Temp Readings from Last 3 Encounters:  03/06/21 98.1 F (36.7 C) (Oral)  02/26/21 98.2 F (36.8 C) (Temporal)  12/29/20 98.2 F (36.8 C) (  Temporal)   BP Readings from Last 3 Encounters:  04/30/21 126/64  03/06/21 (!) 146/69  02/26/21 110/66   Pulse Readings from Last 3 Encounters:  04/30/21 68  03/06/21 91  02/26/21 72    /10  In  general this is a well appearing Caucasian male in no acute distress. He's alert and oriented x4 and appropriate throughout the examination. Cardiopulmonary assessment is negative for acute distress, and he exhibits normal effort.     KPS = 90  100 - Normal; no complaints; no evidence of disease. 90   - Able to carry on normal activity; minor signs or symptoms of disease. 80   - Normal activity with effort; some signs or symptoms of disease. 58   - Cares for self; unable to carry on normal activity or to do active work. 60   - Requires occasional assistance, but is able to care for most of his personal needs. 50   - Requires considerable assistance and frequent medical care. 69   - Disabled; requires special care and assistance. 3   - Severely disabled; hospital admission is indicated although death not imminent. 44   - Very sick; hospital admission necessary; active supportive treatment necessary. 10   - Moribund; fatal processes progressing rapidly. 0     - Dead  Karnofsky DA, Abelmann [place], Craver LS and Burchenal [facility] [phone]) The use of the nitrogen mustards in the palliative treatment of carcinoma: with particular reference to bronchogenic carcinoma Cancer 1 [phone]  LABORATORY DATA:  Lab Results  Component Value Date   WBC 10.6 (H) 03/06/2021   HGB 12.5 (L) 03/06/2021   HCT 37.6 (L) 03/06/2021   MCV 88.5 03/06/2021   PLT 147 (L) 03/06/2021   Lab Results  Component Value Date   NA 137 03/06/2021   K 3.9 03/06/2021   CL 103 03/06/2021   CO2 27 03/06/2021   Lab Results  Component Value Date   ALT 18 02/26/2021   AST 14 02/26/2021   ALKPHOS 56 02/26/2021   BILITOT 0.6 02/26/2021     RADIOGRAPHY: No results found.    IMPRESSION/PLAN: 1. 76 y.o. gentleman with Stage T1c adenocarcinoma of the prostate with Gleason Score of 3+4, and PSA of 10.8. We discussed the patient's workup and outlined the nature of prostate cancer in this setting. The patient's T stage, Gleason's score,  and PSA put him into the favorable intermediate risk group. Accordingly, he is eligible for a variety of potential treatment options including brachytherapy, 5.5 weeks of external radiation, or prostatectomy.  He is not felt to be an ideal surgical candidate given his advanced age.  We discussed the available radiation techniques, and focused on the details and logistics of delivery.  He is not an ideal candidate for brachytherapy with a prostate volume of 137 grams.  Therefore, we discussed and outlined the risks, benefits, short and long-term effects associated with daily external beam radiotherapy and compared and contrasted these with prostatectomy. We discussed the role of SpaceOAR gel in reducing the rectal toxicity associated with radiotherapy. He was encouraged to ask questions that were answered to his stated satisfaction.  At the conclusion of our conversation, the patient is interested in moving forward with 5.5 weeks of external beam therapy. We will share our discussion with Dr. Lovena Neighbours and make arrangements for fiducial markers and SpaceOAR gel placement, first available, prior to simulation, to reduce rectal toxicity from radiotherapy. The patient appears to have a good understanding of his disease and our treatment  recommendations which are of curative intent and is in agreement with the stated plan.  Therefore, we will move forward with treatment planning accordingly, in anticipation of beginning IMRT in the near future.  We enjoyed meeting him today and look forward to continuing to participate in his care.  We personally spent 70 minutes in this encounter including chart review, reviewing radiological studies, meeting face-to-face with the patient, entering orders and completing documentation.    Nicholos Johns, PA-C    Tyler Pita, MD  [facility] Direct Dial: [phone]   Fax: [phone] [email or web address]   Skype   LinkedIn

## 2021-05-06 DIAGNOSIS — Z191 Hormone sensitive malignancy status: Secondary | ICD-10-CM | POA: Diagnosis not present

## 2021-05-06 DIAGNOSIS — C61 Malignant neoplasm of prostate: Secondary | ICD-10-CM | POA: Diagnosis not present

## 2021-05-19 ENCOUNTER — Other Ambulatory Visit: Payer: Self-pay | Admitting: Urology

## 2021-05-19 ENCOUNTER — Telehealth: Payer: Self-pay | Admitting: *Deleted

## 2021-05-19 NOTE — Telephone Encounter (Signed)
CALLED PATIENT TO INFORM OF FID. MARKERS AND SPACE OAR PLACEMENT ON 07-23-21 AND HIS SIM APPT. ON 07-26-21 - ARRIVAL TIME- 10:45 AM @ [facility], SPOKE WITH PATIENT AND HE IS AWARE OF THESE APPTS.

## 2021-05-21 NOTE — Progress Notes (Signed)
Reviewed pt chart, pt has ef 20-25 % and does not meet [facility] guidelines and needs to be moved to [facility] main or, called connie and made aware pt to be moved to main or does not meet [facility] guidelines due to ef 20 to 25 %.

## 2021-06-02 ENCOUNTER — Other Ambulatory Visit: Payer: Self-pay | Admitting: Registered Nurse

## 2021-06-02 DIAGNOSIS — M25579 Pain in unspecified ankle and joints of unspecified foot: Secondary | ICD-10-CM

## 2021-06-15 ENCOUNTER — Encounter: Payer: Self-pay | Admitting: Cardiology

## 2021-06-15 NOTE — Assessment & Plan Note (Signed)
Evaluated with cardiac catheterization.  Nonischemic in nature.  Question if this is the etiology for his reduced EF. ? ?I again reviewed the initiated discussion about BiV ICD/CRT-D.  He had previously declined. ? ?We will plan to continue to titrate his medications. ?

## 2021-06-15 NOTE — Assessment & Plan Note (Signed)
Lipids well controlled.  On 10 mg rosuvastatin.  Tolerating well.  Followed by PCP. ?

## 2021-06-15 NOTE — Assessment & Plan Note (Addendum)
Dramatically reduced EF now 20-25% with normal coronary arteries by cath. ? ?Potential etiology is left bundle branch block.  Hence the consideration of BiV ICD.  He has deferred that in the past.  Was more acceptable of the discussion today, we can discuss in close follow-up. ? ?He is only on carvedilol 3.125 mg daily and STEMI dose of Lasix. ?Concerned about using ACE inhibitor/ARB/Arni because of ACE inhibitor allergy.  Therefore chose to use hydralazine-however this was not started. ?Adding low-dose hydralazine.  Because of use of sildenafil, will hold off on nitrate. ?With frequent urination on furosemide that he is barely taking, will hold off on spironolactone for now, but will likely add in follow-up. ? ?

## 2021-06-15 NOTE — Assessment & Plan Note (Signed)
He has done a good job with weight loss.  He feels better.  Has been very active, just a diet and has been doing exercise until he injured his knee (popliteal cyst). ?

## 2021-06-15 NOTE — Assessment & Plan Note (Addendum)
Not dramatically reduced EF was 2025%-nonischemic.  Has been relatively intolerant of medications.  He had an episode of hypotension which makes me really leery of being overly aggressive.  We have been not inclined to use ACE inhibitor or ARB because of his history of ACE inhibitor allergy.  Therefore Entresto not an option at this point.  We will gradually plan to add medications as tolerated. ? ?Plan: ?? Continue carvedilol 3.125 mg twice daily ?? Continue furosemide taking at least twice a week standing and as needed dosing. ?? Start hydralazine 10 mg twice daily to see if he tolerates.   ?? We will follow-up in 4 months to reassess and potentially either titrate hydralazine versus add spironolactone.. ?? He asked that we do not put him on nitrates because of his desire of using Viagra. ?

## 2021-07-01 ENCOUNTER — Encounter: Payer: Self-pay | Admitting: Gastroenterology

## 2021-07-01 ENCOUNTER — Other Ambulatory Visit: Payer: Self-pay | Admitting: General Practice

## 2021-07-02 ENCOUNTER — Other Ambulatory Visit: Payer: Self-pay | Admitting: Registered Nurse

## 2021-07-02 DIAGNOSIS — M25579 Pain in unspecified ankle and joints of unspecified foot: Secondary | ICD-10-CM

## 2021-07-08 NOTE — Progress Notes (Addendum)
DUE TO COVID-19 ONLY  2  VISITOR IS ALLOWED TO COME WITH YOU AND STAY IN THE WAITING ROOM ONLY DURING PRE OP AND PROCEDURE DAY OF SURGERY.   4 VISITOR  MAY VISIT WITH YOU AFTER SURGERY IN YOUR PRIVATE ROOM DURING VISITING HOURS ONLY! ?YOU MAY HAVE ONE PERSON SPEND THE NITE WITH YOU IN YOUR ROOM AFTER SURGERY.   ? ? ? Your procedure is scheduled on:  ?  07/23/21  ? Report to [facility] Main  Entrance ? ? Report to admitting at    0915             AM ?DO NOT BRING INSURANCE CARD, PICTURE ID OR WALLET DAY OF SURGERY.  ?  ? ? Call this number if you have problems the morning of surgery [phone]  ? ? REMEMBER: NO  SOLID FOODS , CANDY, GUM OR MINTS AFTER MIDNITE THE NITE BEFORE SURGERY .       Marland Kitchen CLEAR LIQUIDS UNTIL    0830am             DAY OF SURGERY.      ? ?FLEETS ENEMA 2 HOURS PRIOR TO ARRIVAL  ? ? ?CLEAR LIQUID DIET ? ? ?Foods Allowed      ?WATER ?BLACK COFFEE ( SUGAR OK, NO MILK, CREAM OR CREAMER) REGULAR AND DECAF  ?TEA ( SUGAR OK NO MILK, CREAM, OR CREAMER) REGULAR AND DECAF  ?PLAIN JELLO ( NO RED)  ?FRUIT ICES ( NO RED, NO FRUIT PULP)  ?POPSICLES ( NO RED)  ?JUICE- APPLE, WHITE GRAPE AND WHITE CRANBERRY  ?SPORT DRINK LIKE GATORADE ( NO RED)  ?CLEAR BROTH ( VEGETABLE , CHICKEN OR BEEF)                                                               ? ?    ? ?BRUSH YOUR TEETH MORNING OF SURGERY AND RINSE YOUR MOUTH OUT, NO CHEWING GUM CANDY OR MINTS. ?  ? ? Take these medicines the morning of surgery with A SIP OF WATER: coreg, hydralazine, flomax, gabapentin  ? ? ?DO NOT TAKE ANY DIABETIC MEDICATIONS DAY OF YOUR SURGERY ?                  ?            You may not have any metal on your body including hair pins and  ?            piercings  Do not wear jewelry, make-up, lotions, powders or perfumes, deodorant ?            Do not wear nail polish on your fingernails.   ?           IF YOU ARE A MALE AND WANT TO SHAVE UNDER ARMS OR LEGS PRIOR TO SURGERY YOU MUST DO SO AT LEAST 48 HOURS PRIOR TO SURGERY.  ?             Men may shave face and neck. ? ? Do not bring valuables to the hospital. [place] NOT ?            RESPONSIBLE   FOR VALUABLES. ? Contacts, dentures or bridgework may not be worn into surgery. ? Leave suitcase in the  car. After surgery it may be brought to your room. ? ?  ? Patients discharged the day of surgery will not be allowed to drive home. IF YOU ARE HAVING SURGERY AND GOING HOME THE SAME DAY, YOU MUST HAVE AN ADULT TO DRIVE YOU HOME AND BE WITH YOU FOR 24 HOURS. YOU MAY GO HOME BY TAXI OR UBER OR ORTHERWISE, BUT AN ADULT MUST ACCOMPANY YOU HOME AND STAY WITH YOU FOR 24 HOURS. ?  ? ?            Please read over the following fact sheets you were given: ?_____________________________________________________________________ ? ?[facility] - Preparing for Surgery ?Before surgery, you can play an important role.  Because skin is not sterile, your skin needs to be as free of germs as possible.  You can reduce the number of germs on your skin by washing with CHG (chlorahexidine gluconate) soap before surgery.  CHG is an antiseptic cleaner which kills germs and bonds with the skin to continue killing germs even after washing. ?Please DO NOT use if you have an allergy to CHG or antibacterial soaps.  If your skin becomes reddened/irritated stop using the CHG and inform your nurse when you arrive at Short Stay. ?Do not shave (including legs and underarms) for at least 48 hours prior to the first CHG shower.  You may shave your face/neck. ?Please follow these instructions carefully: ? 1.  Shower with CHG Soap the night before surgery and the  morning of Surgery. ? 2.  If you choose to wash your hair, wash your hair first as usual with your  normal  shampoo. ? 3.  After you shampoo, rinse your hair and body thoroughly to remove the  shampoo.                           4.  Use CHG as you would any other liquid soap.  You can apply chg directly  to the skin and wash  ?                     Gently with a  scrungie or clean washcloth. ? 5.  Apply the CHG Soap to your body ONLY FROM THE NECK DOWN.   Do not use on face/ open      ?                     Wound or open sores. Avoid contact with eyes, ears mouth and genitals (private parts).  ?                     Production manager,  Genitals (private parts) with your normal soap. ?            6.  Wash thoroughly, paying special attention to the area where your surgery  will be performed. ? 7.  Thoroughly rinse your body with warm water from the neck down. ? 8.  DO NOT shower/wash with your normal soap after using and rinsing off  the CHG Soap. ?               9.  Pat yourself dry with a clean towel. ?           10.  Wear clean pajamas. ?           11.  Place clean sheets on your bed the night of your first shower and do not  sleep with pets. ?Day of Surgery : ?Do not apply any lotions/deodorants the morning of surgery.  Please wear clean clothes to the hospital/surgery center. ? ?FAILURE TO FOLLOW THESE INSTRUCTIONS MAY RESULT IN THE CANCELLATION OF YOUR SURGERY ?PATIENT SIGNATURE_________________________________ ? ?NURSE SIGNATURE__________________________________ ? ?________________________________________________________________________  ? ? ?           ?

## 2021-07-08 NOTE — Progress Notes (Addendum)
Anesthesia Review: ? ?PCP: Dr [name] L[facility] 02/26/21  ?Cardiologist : DR Glenetta Hew L[facility] 04/30/21  ?Chest x-ray : ?EKG :04/30/21  ?Echo :10/07/20  ?Stress test: ?Cardiac Cath : 06/25/20  ?Activity level: can do a flgiht of stairs without difficulty  ?Sleep Study/ CPAP : none  ?Fasting Blood Sugar :      / Checks Blood Sugar -- times a day:   ?Blood Thinner/ Instructions /Last Dose: ?ASA / Instructions/ Last Dose :   ?81 mg Aspirin  ?

## 2021-07-12 ENCOUNTER — Encounter (HOSPITAL_COMMUNITY): Payer: Self-pay

## 2021-07-12 ENCOUNTER — Encounter (HOSPITAL_COMMUNITY)
Admission: RE | Admit: 2021-07-12 | Discharge: 2021-07-12 | Disposition: A | Discharge status: Home / Self Care | Payer: Medicare HMO | Source: Ambulatory Visit | Attending: Urology | Admitting: Urology

## 2021-07-12 ENCOUNTER — Other Ambulatory Visit: Payer: Self-pay

## 2021-07-12 VITALS — BP 118/76 | HR 73 | Temp 97.6°F | Resp 16 | Ht 69.0 in | Wt 185.0 lb

## 2021-07-12 DIAGNOSIS — Z01812 Encounter for preprocedural laboratory examination: Secondary | ICD-10-CM | POA: Diagnosis not present

## 2021-07-12 DIAGNOSIS — Z87891 Personal history of nicotine dependence: Secondary | ICD-10-CM | POA: Diagnosis not present

## 2021-07-12 DIAGNOSIS — Z01818 Encounter for other preprocedural examination: Secondary | ICD-10-CM

## 2021-07-12 DIAGNOSIS — I428 Other cardiomyopathies: Secondary | ICD-10-CM | POA: Diagnosis not present

## 2021-07-12 DIAGNOSIS — I447 Left bundle-branch block, unspecified: Secondary | ICD-10-CM | POA: Insufficient documentation

## 2021-07-12 DIAGNOSIS — R7303 Prediabetes: Secondary | ICD-10-CM | POA: Insufficient documentation

## 2021-07-12 DIAGNOSIS — I2510 Atherosclerotic heart disease of native coronary artery without angina pectoris: Secondary | ICD-10-CM | POA: Insufficient documentation

## 2021-07-12 DIAGNOSIS — C61 Malignant neoplasm of prostate: Secondary | ICD-10-CM | POA: Insufficient documentation

## 2021-07-12 HISTORY — DX: Prediabetes: R73.03

## 2021-07-12 LAB — CBC
HCT: 41.4 % (ref 39.0–52.0)
Hemoglobin: 13.5 g/dL (ref 13.0–17.0)
MCH: 29.5 pg (ref 26.0–34.0)
MCHC: 32.6 g/dL (ref 30.0–36.0)
MCV: 90.6 fL (ref 80.0–100.0)
Platelets: 155 10*3/uL (ref 150–400)
RBC: 4.57 MIL/uL (ref 4.22–5.81)
RDW: 13.2 % (ref 11.5–15.5)
WBC: 8.4 10*3/uL (ref 4.0–10.5)
nRBC: 0 % (ref 0.0–0.2)

## 2021-07-12 LAB — HEMOGLOBIN A1C
Hgb A1c MFr Bld: 6.2 % — ABNORMAL HIGH (ref 4.8–5.6)
Mean Plasma Glucose: 131.24 mg/dL

## 2021-07-12 LAB — BASIC METABOLIC PANEL
Anion gap: 5 (ref 5–15)
BUN: 25 mg/dL — ABNORMAL HIGH (ref 8–23)
CO2: 25 mmol/L (ref 22–32)
Calcium: 9.4 mg/dL (ref 8.9–10.3)
Chloride: 110 mmol/L (ref 98–111)
Creatinine, Ser: 0.97 mg/dL (ref 0.61–1.24)
GFR, Estimated: >60 mL/min (ref >60)
Glucose, Bld: 104 mg/dL — ABNORMAL HIGH (ref 70–99)
Potassium: 4.4 mmol/L (ref 3.5–5.1)
Sodium: 140 mmol/L (ref 135–145)

## 2021-07-12 LAB — GLUCOSE, CAPILLARY: Glucose-Capillary: 95 mg/dL (ref 70–99)

## 2021-07-13 ENCOUNTER — Other Ambulatory Visit: Payer: Self-pay

## 2021-07-13 MED ORDER — HYDRALAZINE HCL 25 MG PO TABS: 25.0000 mg | ORAL_TABLET | Freq: Two times a day (BID) | ORAL | 3 refills | Status: DC

## 2021-07-13 NOTE — Progress Notes (Signed)
Anesthesia Chart Review ? ? Case: [id number] Date/Time: 07/23/21 1115  ? Procedures:  ?    GOLD SEED IMPLANT ?    SPACE OAR INSTILLATION  ? Anesthesia type: Monitor Anesthesia Care  ? Pre-op diagnosis: PROSTATE CANCER  ? Location: [facility]  ? Surgeons: Ardis Hughs, MD  ? ?  ? ? ?DISCUSSION:76 y.o. 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 Louis Meckel.  ? ?Pt last seen by cardiology 04/30/2021, stable at this visit.  ? ?Anticipate pt can proceed with planned procedure barring acute status change.   ?VS: BP 118/76   Pulse 73   Temp 36.4 ?C (Oral)   Resp 16   Ht '5\' 9"'$  (1.753 m)   Wt 83.9 kg   SpO2 98%   BMI 27.32 kg/m?  ? ?PROVIDERS: ?[name], MD is PCP  ? ?Glenetta Hew, MD is Cardiologist  ?LABS: Labs reviewed: Acceptable for surgery. ?(all labs ordered are listed, but only abnormal results are displayed) ? ?Labs Reviewed  ?BASIC METABOLIC PANEL - Abnormal; Notable for the following components:  ?    Result Value  ? Glucose, Bld 104 (*)   ? BUN 25 (*)   ? All other components within normal limits  ?HEMOGLOBIN A1C - Abnormal; Notable for the following components:  ? Hgb A1c MFr Bld 6.2 (*)   ? All other components within normal limits  ?CBC  ?GLUCOSE, CAPILLARY  ? ? ? ?IMAGES: ? ? ?EKG: ?04/30/21 ?Rate 68 bpm  ?NSR ?LAD  ?LBBB ? ?CV: ?Echo 10/06/2020 ?1. There is no left ventricular thrombus. There is severe septal-lateral  ?wall systolic dyssynchrony due to LBBB. Left ventricular ejection  ?fraction, by estimation, is 20 to 25%. The left ventricle has severely  ?decreased function. The left ventricle  ?demonstrates global hypokinesis. The left ventricular internal cavity size  ?was moderately dilated. Left ventricular diastolic parameters are  ?consistent with Grade I diastolic dysfunction (impaired relaxation).  ? 2. Right ventricular systolic function is normal. The right  ventricular  ?size is normal. Tricuspid regurgitation signal is inadequate for assessing  ?PA pressure.  ? 3. Left atrial size was mildly dilated.  ? 4. The mitral valve is normal in structure. Mild to moderate mitral valve  ?regurgitation.  ? 5. The aortic valve is tricuspid. There is mild calcification of the  ?aortic valve. There is mild thickening of the aortic valve. Aortic valve  ?regurgitation is not visualized. Mild aortic valve sclerosis is present,  ?with no evidence of aortic valve  ?stenosis.  ? 6. The inferior vena cava is normal in size with greater than 50%  ?respiratory variability, suggesting right atrial pressure of 3 mmHg. ? ?Cardiac Cath 06/25/2020 ?Left dominant coronary anatomy with widely patent, normal coronary arteries. ?Severe left ventricular systolic dysfunction and moderately elevated LVEDP at 23 mmHg.  Consistent with chronic systolic heart failure. ?Mild pulmonary hypertension with mean PA pressure 24 mmHg, capillary wedge mean pressure 19 mmHg, cardiac output 6.32 L/min and cardiac index 3.1 L/min/m? ?  ?RECOMMENDATIONS: ?  ?Guideline directed therapy for systolic heart failure: 4 pillar approach with Entresto, MRA, SGLT2, and beta-blocker therapy titrated against the patient's blood pressure and  Tolerability. ?Per Dr. Ellyn Hack ?Past Medical History:  ?Diagnosis Date  ? Arthritis   ? BPH (benign prostatic hypertrophy)   ? CHF (congestive heart failure) ([place])   ? Coronary atherosclerosis of native coronary vessel   ?  CATH 2004--  MINIMAL NONOBSTRUCTIVE LAD/  NORMAL EF/    CARDIAC CT  03/2008  NO SIGNIFICANT DISEASE  AND  nlef  ? Elevated PSA   ? Eye problem 03/06/2016  ? [name] retinal detachment left eye, no sx done  ? History of gout   ? pt 05-20-2013 states stable   ? History of melanoma excision   ? 2011-- LEFT UPPER BACK  ? Hyperlipemia   ? Hypertriglyceridemia   ? Left bundle branch block   ? 08/2017  ? Melanoma ([place]) 2011  ? back  ? Nonischemic cardiomyopathy ([place])   ?  09-04-2009  --  2D echo-EF 40-45%, Global Hypokinesis - 09/2017 -> EF 45-50%   ? Numbness   ? hands  ? Personal history of arthritis   ? Personal history of other diseases of circulatory system   ? Pre-diabetes   ? ? ?Past Surgical History:  ?Procedure Laterality Date  ? ANTERIOR CERVICAL DECOMP/DISCECTOMY FUSION N/A 04/20/2018  ? Procedure: Anterior Cervical Decompression Fusion - Cervical Three-Cervical Four - Cervical Four-Cervical Five - Cervical Five-Cervical Six;  Surgeon: Eustace Moore, MD;  Location: [facility];  Service: Neurosurgery;  Laterality: N/A;  Anterior Cervical Decompression Fusion - Cervical Three-Cervical Four - Cervical Four-Cervical Five - Cervical Five-Cervical Six  ? APPENDECTOMY  AS CHILD  ? BACK SURGERY    ? CARDIAC CATHETERIZATION  05/23/2002  &  05-23-1998  ? minimal irregularities in the LAD/  EF 50%  -   DR AL LITTLE  ? CARDIOVASCULAR STRESS TEST  10/05/1998  ? MILD GLOBAL HYPOKINESIS AND ISCHEMIAIN ANTEROSEPTAL  AT [facility]/ EF 42%  ? CARPAL TUNNEL RELEASE Right   ? EXCISION MELANOMA LEFT UPPER BACK  10/14/2009  ? eye lid surgery Bilateral 2018  ? KNEE ARTHROSCOPY WITH SUBCHONDROPLASTY Left 04/07/2017  ? Procedure: LEFT KNEE ARTHROSCOPY WITH PARTIAL MEDIAL MENISCECTOMY AND MEDIAL TIBIAL SUBCHONDROPLASTY;  Surgeon: Mcarthur Rossetti, MD;  [facility];  Service: Orthopedics;  Laterality: Left;  ? knee injections Bilateral   ? LUMBAR DISC SURGERY  09/12/2000  ? left  L5 -- S1  ? PROSTATE BIOPSY N/A 05/22/2013  ? Procedure: BIOPSY TRANSRECTAL ULTRASONIC PROSTATE (TUBP);  Surgeon: [name], MD;  Location: [facility];  Service: Urology;  Laterality: N/A;  ? RIGHT/LEFT HEART CATH AND CORONARY ANGIOGRAPHY N/A 06/25/2020  ? Procedure: RIGHT/LEFT HEART CATH AND CORONARY ANGIOGRAPHY;  Surgeon: Belva Crome, MD;  Location: [facility];:; Normal coronary arteries; LVEDP 23 mmHg 370.  19 mmHg.  ? ROTATOR CUFF REPAIR Right 2016  ? THROAT SURGERY  04/20/2018  ?  TRANSRECTAL ULTRASOUND PROSTATE BX  05-23-2005  &  04-19-2001  ? TRANSTHORACIC ECHOCARDIOGRAM  09/2017  ? EF 45-50 % (previously reported as 40 to 45%).  Incoordinate septal motion with mild LVH.  GR 1 DD.  Aortic sclerosis but no stenosis.  ? TRANSTHORACIC ECHOCARDIOGRAM  10/06/2020  ? Severe septal and lateral wall  ? ? ?MEDICATIONS: ? aspirin EC 81 MG tablet  ? carvedilol (COREG) 3.125 MG tablet  ? colchicine 0.6 MG tablet  ? finasteride (PROSCAR) 5 MG tablet  ? furosemide (LASIX) 40 MG tablet  ? gabapentin (NEURONTIN) 300 MG capsule  ? Garlic 7124 MG CAPS  ? hydrALAZINE (APRESOLINE) 25 MG tablet  ? magnesium oxide (MAG-OX) 400 MG tablet  ? Multiple Vitamin (MULTIVITAMIN WITH MINERALS) TABS tablet  ? Omega-3 Fatty Acids (FISH OIL) 500 MG CAPS  ? rosuvastatin (CRESTOR) 10 MG tablet  ? sildenafil (  VIAGRA) 100 MG tablet  ? tamsulosin (FLOMAX) 0.4 MG CAPS capsule  ? Turmeric 500 MG TABS  ? vitamin B-12 (CYANOCOBALAMIN) 500 MCG tablet  ? Zinc 50 MG TABS  ? ? sodium chloride flush (NS) 0.9 % injection 3 mL  ? ? ? ?Konrad Felix Ward, PA-C ?[facility] ?[phone] ? ? ? ? ? ?

## 2021-07-23 ENCOUNTER — Telehealth: Payer: Self-pay | Admitting: *Deleted

## 2021-07-23 ENCOUNTER — Ambulatory Visit (HOSPITAL_COMMUNITY): Payer: Medicare HMO | Admitting: Physician Assistant

## 2021-07-23 ENCOUNTER — Ambulatory Visit (HOSPITAL_COMMUNITY)
Admission: RE | Admit: 2021-07-23 | Discharge: 2021-07-23 | Disposition: A | Discharge status: Home / Self Care | Payer: Medicare HMO | Attending: Urology | Admitting: Urology

## 2021-07-23 ENCOUNTER — Encounter (HOSPITAL_COMMUNITY): Payer: Self-pay | Admitting: Urology

## 2021-07-23 ENCOUNTER — Encounter (HOSPITAL_COMMUNITY)
Admission: RE | Disposition: A | Discharge status: Home / Self Care | Payer: Self-pay | Source: Home / Self Care | Attending: Urology

## 2021-07-23 ENCOUNTER — Ambulatory Visit (HOSPITAL_BASED_OUTPATIENT_CLINIC_OR_DEPARTMENT_OTHER): Payer: Medicare HMO | Admitting: Anesthesiology

## 2021-07-23 DIAGNOSIS — I509 Heart failure, unspecified: Secondary | ICD-10-CM | POA: Diagnosis not present

## 2021-07-23 DIAGNOSIS — I2510 Atherosclerotic heart disease of native coronary artery without angina pectoris: Secondary | ICD-10-CM | POA: Diagnosis not present

## 2021-07-23 DIAGNOSIS — C61 Malignant neoplasm of prostate: Secondary | ICD-10-CM

## 2021-07-23 DIAGNOSIS — M1990 Unspecified osteoarthritis, unspecified site: Secondary | ICD-10-CM | POA: Diagnosis not present

## 2021-07-23 HISTORY — PX: GOLD SEED IMPLANT: SHX6343

## 2021-07-23 HISTORY — PX: SPACE OAR INSTILLATION: SHX6769

## 2021-07-23 SURGERY — INSERTION, GOLD SEEDS
Anesthesia: Monitor Anesthesia Care | Site: Rectum

## 2021-07-23 MED ORDER — LACTATED RINGERS IV SOLN: INTRAVENOUS | Status: DC

## 2021-07-23 MED ORDER — SODIUM CHLORIDE FLUSH 0.9 % IV SOLN
INTRAVENOUS | Status: DC | PRN
  Administered 2021-07-23: 50 mL

## 2021-07-23 MED ORDER — SODIUM CHLORIDE (PF) 0.9 % IJ SOLN
INTRAMUSCULAR | Status: AC
  Filled 2021-07-23: qty 50

## 2021-07-23 MED ORDER — PROPOFOL 10 MG/ML IV BOLUS
INTRAVENOUS | Status: AC
  Filled 2021-07-23: qty 20

## 2021-07-23 MED ORDER — PROPOFOL 500 MG/50ML IV EMUL
INTRAVENOUS | Status: AC
  Filled 2021-07-23: qty 50

## 2021-07-23 MED ORDER — FENTANYL CITRATE PF 50 MCG/ML IJ SOSY: 25.0000 ug | PREFILLED_SYRINGE | INTRAMUSCULAR | Status: DC | PRN

## 2021-07-23 MED ORDER — ORAL CARE MOUTH RINSE: 15.0000 mL | Freq: Once | OROMUCOSAL | Status: AC

## 2021-07-23 MED ORDER — ACETAMINOPHEN 10 MG/ML IV SOLN: 1000.0000 mg | Freq: Once | INTRAVENOUS | Status: DC | PRN

## 2021-07-23 MED ORDER — LIDOCAINE HCL (PF) 2 % IJ SOLN
INTRAMUSCULAR | Status: AC
  Filled 2021-07-23: qty 5

## 2021-07-23 MED ORDER — PROPOFOL 500 MG/50ML IV EMUL
INTRAVENOUS | Status: DC | PRN
  Administered 2021-07-23: 75 ug/kg/min via INTRAVENOUS

## 2021-07-23 MED ORDER — FENTANYL CITRATE (PF) 100 MCG/2ML IJ SOLN
INTRAMUSCULAR | Status: AC
Start: 2021-07-23 — End: ?
  Filled 2021-07-23: qty 2

## 2021-07-23 MED ORDER — ACETAMINOPHEN 160 MG/5ML PO SOLN: 1000.0000 mg | Freq: Once | ORAL | Status: DC | PRN

## 2021-07-23 MED ORDER — CHLORHEXIDINE GLUCONATE 0.12 % MT SOLN
15.0000 mL | Freq: Once | OROMUCOSAL | Status: AC
  Administered 2021-07-23: 15 mL via OROMUCOSAL

## 2021-07-23 MED ORDER — LIDOCAINE 1 % OPTIME INJ - NO CHARGE
INTRAMUSCULAR | Status: DC | PRN
  Administered 2021-07-23: 5 mL

## 2021-07-23 MED ORDER — LIDOCAINE HCL (PF) 1 % IJ SOLN
INTRAMUSCULAR | Status: AC
  Filled 2021-07-23: qty 30

## 2021-07-23 MED ORDER — CEFAZOLIN SODIUM-DEXTROSE 2-4 GM/100ML-% IV SOLN
2.0000 g | Freq: Once | INTRAVENOUS | Status: AC
  Administered 2021-07-23: 2 g via INTRAVENOUS
  Filled 2021-07-23: qty 100

## 2021-07-23 MED ORDER — PROPOFOL 10 MG/ML IV BOLUS
INTRAVENOUS | Status: DC | PRN
  Administered 2021-07-23: 30 mg via INTRAVENOUS
  Administered 2021-07-23: 40 mg via INTRAVENOUS

## 2021-07-23 MED ORDER — LIDOCAINE HCL URETHRAL/MUCOSAL 2 % EX GEL
CUTANEOUS | Status: AC
  Filled 2021-07-23: qty 30

## 2021-07-23 MED ORDER — ACETAMINOPHEN 500 MG PO TABS: 1000.0000 mg | ORAL_TABLET | Freq: Once | ORAL | Status: DC | PRN

## 2021-07-23 SURGICAL SUPPLY — 24 items
BAG COUNTER SPONGE SURGICOUNT (BAG) IMPLANT
COVER SURGICAL LIGHT HANDLE (MISCELLANEOUS) ×2 IMPLANT
DRSG TEGADERM 4X4.75 (GAUZE/BANDAGES/DRESSINGS) ×4 IMPLANT
DRSG TEGADERM 8X12 (GAUZE/BANDAGES/DRESSINGS) ×4 IMPLANT
DRSG TELFA 3X8 NADH (GAUZE/BANDAGES/DRESSINGS) ×2 IMPLANT
GLOVE BIO SURGEON STRL SZ7.5 (GLOVE) ×2 IMPLANT
GLOVE ECLIPSE 8.0 STRL XLNG CF (GLOVE) ×2 IMPLANT
IMPL SPACEOAR VUE SYSTEM (Spacer) ×1 IMPLANT
IMPLANT SPACEOAR VUE SYSTEM (Spacer) ×2 IMPLANT
MARKER GOLD PRELOAD 1.2X3 (Urological Implant) ×1 IMPLANT
NDL SAFETY ECLIPSE 18X1.5 (NEEDLE) IMPLANT
NDL SPNL 22GX7 QUINCKE BK (NEEDLE) IMPLANT
NEEDLE HYPO 18GX1.5 SHARP (NEEDLE)
NEEDLE SPNL 22GX7 QUINCKE BK (NEEDLE) IMPLANT
PACK CYSTO (CUSTOM PROCEDURE TRAY) ×2 IMPLANT
PAD DRESSING TELFA 3X8 NADH (GAUZE/BANDAGES/DRESSINGS) ×1 IMPLANT
PENCIL SMOKE EVACUATOR (MISCELLANEOUS) IMPLANT
SEED GOLD PRELOAD 1.2X3 (Urological Implant) ×6 IMPLANT
SURGILUBE 2OZ TUBE FLIPTOP (MISCELLANEOUS) ×2 IMPLANT
SYR 20ML LL LF (SYRINGE) IMPLANT
TOWEL OR 17X26 10 PK STRL BLUE (TOWEL DISPOSABLE) ×2 IMPLANT
TUBING CONNECTING 10 (TUBING) IMPLANT
UNDERPAD 30X36 HEAVY ABSORB (UNDERPADS AND DIAPERS) ×4 IMPLANT
WATER STERILE IRR 500ML POUR (IV SOLUTION) ×2 IMPLANT

## 2021-07-23 NOTE — Anesthesia Preprocedure Evaluation (Addendum)
Anesthesia Evaluation  ?Patient identified by MRN, date of birth, ID band ?Patient awake ? ? ? ?Reviewed: ?Allergy & Precautions, NPO status , Patient's Chart, lab work & pertinent test results ? ?History of Anesthesia Complications ?Negative for: history of anesthetic complications ? ?Airway ?Mallampati: III ? ?TM Distance: >3 FB ?Neck ROM: Full ? ? ? Dental ? ?(+) Teeth Intact, Dental Advisory Given ?  ?Pulmonary ?former smoker,  ?  ?breath sounds clear to auscultation ? ? ? ? ? ? Cardiovascular ?+ CAD and +CHF  ?+ dysrhythmias  ?Rhythm:Regular  ??1. There is no left ventricular thrombus. There is severe septal-lateral  ?wall systolic dyssynchrony due to LBBB. Left ventricular ejection  ?fraction, by estimation, is 20 to 25%. The left ventricle has severely  ?decreased function. The left ventricle  ?demonstrates global hypokinesis. The left ventricular internal cavity size  ?was moderately dilated. Left ventricular diastolic parameters are  ?consistent with Grade I diastolic dysfunction (impaired relaxation).  ??2. Right ventricular systolic function is normal. The right ventricular  ?size is normal. Tricuspid regurgitation signal is inadequate for assessing  ?PA pressure.  ??3. Left atrial size was mildly dilated.  ??4. The mitral valve is normal in structure. Mild to moderate mitral valve  ?regurgitation.  ??5. The aortic valve is tricuspid. There is mild calcification of the  ?aortic valve. There is mild thickening of the aortic valve. Aortic valve  ?regurgitation is not visualized. Mild aortic valve sclerosis is present,  ?with no evidence of aortic valve  ?stenosis.  ??6. The inferior vena cava is normal in size with greater than 50%  ?respiratory variability, suggesting right atrial pressure of 3 mmHg.  ? ?? Left dominant coronary anatomy with widely patent, normal coronary arteries. ?? Severe left ventricular systolic dysfunction and moderately elevated LVEDP at 23 mmHg.   Consistent with chronic systolic heart failure. ?? Mild pulmonary hypertension with mean PA pressure 24 mmHg, capillary wedge mean pressure 19 mmHg, cardiac output 6.32 L/min and cardiac index 3.1 L/min/m? ?? ?RECOMMENDATIONS: ?? ?? Guideline directed therapy for systolic heart failure: 4 pillar approach with Entresto, MRA, SGLT2, and beta-blocker therapy titrated against the patient's blood pressure and  Tolerability. ?? Per Dr. Ellyn Hack ?? ? ?  ?Neuro/Psych ?negative neurological ROS ? negative psych ROS  ? GI/Hepatic ?negative GI ROS, Neg liver ROS,   ?Endo/Other  ?negative endocrine ROS ? Renal/GU ?negative Renal ROS  ? ?  ?Musculoskeletal ? ?(+) Arthritis ,  ? Abdominal ?  ?Peds ? Hematology ?Lab Results ?     Component                Value               Date                 ?     WBC                      8.4                 07/12/2021           ?     HGB                      13.5                07/12/2021           ?     HCT  41.4                07/12/2021           ?     MCV                      90.6                07/12/2021           ?     PLT                      155                 07/12/2021           ?   ?Anesthesia Other Findings ? ? Reproductive/Obstetrics ? ?  ? ? ? ? ? ? ? ? ? ? ? ? ? ?  ?  ? ? ? ? ? ? ? ? ?Anesthesia Physical ?Anesthesia Plan ? ?ASA: 3 ? ?Anesthesia Plan: MAC  ? ?Post-op Pain Management: Minimal or no pain anticipated  ? ?Induction: Intravenous ? ?PONV Risk Score and Plan: 1 and Propofol infusion and Treatment may vary due to age or medical condition ? ?Airway Management Planned: Nasal Cannula and Natural Airway ? ?Additional Equipment: None ? ?Intra-op Plan:  ? ?Post-operative Plan:  ? ?Informed Consent: I have reviewed the patients History and Physical, chart, labs and discussed the procedure including the risks, benefits and alternatives for the proposed anesthesia with the patient or authorized representative who has indicated his/her understanding and  acceptance.  ? ? ? ?Dental advisory given ? ?Plan Discussed with: CRNA ? ?Anesthesia Plan Comments:   ? ? ? ? ? ? ?Anesthesia Quick Evaluation ? ?

## 2021-07-23 NOTE — Op Note (Signed)
Preoperative diagnosis: Clinically localized adenocarcinoma of the prostate  ? ?Postoperative diagnosis: Clinically localized adenocarcinoma of the prostate ? ?Procedure: 1) Placement of fiducial markers into prostate ?                   2) Insertion of SpaceOAR hydrogel  ? ?Surgeon: Louis Meckel, M.D. ? ?Anesthesia: General ? ?EBL: Minimal ? ?Complications: None ? ?Indication: [name] [name] is a 76 y.o. gentleman with clinically localized prostate cancer. After discussing management options for treatment, he elected to proceed with radiotherapy. He presents today for the above procedures. The potential risks, complications, alternative options, and expected recovery course have been discussed in detail with the patient and he has provided informed consent to proceed. ? ?Description of procedure: The patient was administered preoperative antibiotics, placed in the dorsal lithotomy position, and prepped and draped in the usual sterile fashion. Next, transrectal ultrasonography was utilized to visualize the prostate. Three gold fiducial markers were then placed into the prostate via transperineal needles under ultrasound guidance at the right apex, right base, and left mid gland under direct ultrasound guidance. A site in the midline was then selected on the perineum for placement of an 18 g needle with saline. The needle was advanced above the rectum and below Denonvillier's fascia to the mid gland and confirmed to be in the midline on transverse imaging. One cc of saline was injected confirming appropriate expansion of this space. A total of 5 cc of saline was then injected to open the space further bilaterally. The saline syringe was then removed and the SpaceOAR hydrogel was injected with good distribution bilaterally. He tolerated the procedure well and without complications. He was given a voiding trial prior to discharge from the PACU.  ?

## 2021-07-23 NOTE — H&P (Signed)
CC: Prostate cancer  ? ?HPI: [name] [name] is a 76 year old male with a long history of T1c, grade 2 prostate cancer, BPH and microscopic hematuria (negative CT and cysto in 2020).  ? ?Last PSA-10.4 (12/2020), 6.8 (06/2020), 10.30 (12/2019), 6.7 (11/2018), 8.18, %free- 31 (11/2017) 8.0 (2018). His PSA has ranged between 5 and 10 for the last several years. 3 prior negative prostate biopsies with Dr. Risa Grill.  ?Biopsy Date: 02/25/2021  ?TNM stage: T1c  ?Gleason score: 3+4 = 7  ?Left: A total of 5 cores were positive for grade 1 and 2 prostate cancer 3 of the 4 samples taken from the ROI seen on recent prostate MRI were positive for grade 2 prostate cancer.  ?Right: NED  ?Prostate volume: 137 cm?  ? ?01/06/2020: The patient is here today for a routine follow-up. From a urinary standpoint, he is currently on tamsulosin once daily. He reports an adequate FOS and feels like he is emptying his bladder well. He has occasional urgency/frequency, but is not bothered by it. Nocturia x 2-3. Denies interval UTIs, dysuria or hematuria. He continues to use sildenafil 20-40 mg PRN with excellent erectile quality and no associated side effects.  ? ?01/07/21: The patient is here today for a routine follow-up. His PSA continues to undulate. He continues to take tamsulosin once daily and reports stable LUTS. Denies interval UTIs, dysuria or hematuria. He continues to report excellent erectile quality with sildenafil 40 mg.  ? ?02/25/2021: The patient is here today for an MRI fusion biopsy after he was found to have a PI-RADS 4 lesion involving the left posterior lateral apex.  ? ?03/02/2021: The patient is here today for routine follow-up after his recent prostate biopsy showed multifocal grade 1 and 2 prostate cancer. He was very uncomfortable during his prostate biopsy, but is recovering without any issues. He is urinating without difficulty denies residual hematuria, dysuria or blood per rectum.  ? ?  ?ALLERGIES: No Allergies ?   ? ?MEDICATIONS: Crestor 5 mg tablet  ?Levofloxacin 750 mg tablet 1 tablet PO As Directed Take one hour prior to your scheduled procedure.  ?Tamsulosin Hcl 0.4 mg capsule 1 capsule PO Daily  ?Aspir 81  ?Fish Oil CAPS 0 Oral  ?Gabapentin 300 mg capsule 0 Oral  ?Garlic capsule 0 Oral  ?Multivitamins tablet 1 Oral Daily  ?Sildenafil Citrate 20 mg tablet 1 tablet PO Daily PRN  ?Vitamin B-12 100 mcg tablet 0 Oral  ?  ? ?GU PSH: Cystoscopy - 2020 ?Locm 300-'399Mg'$ /Ml Iodine,1Ml - 2020 ?Prostate Needle Biopsy - 02/25/2021 ? ?  ?   ?PSH Notes: Colonoscopy (Fiberoptic)  ? ?NON-GU PSH: Diagnostic Colonoscopy - 2009 ?Surgical Pathology, Gross And Microscopic Examination For Prostate Needle - 02/25/2021 ? ?  ? ?GU PMH: Elevated PSA - 02/25/2021, - 01/07/2021, - 01/06/2020, - 2018, - 2017, Elevated prostate specific antigen (PSA), - 2016 ?BPH w/LUTS - 01/07/2021, - 01/06/2020, Benign prostatic hyperplasia with urinary obstruction, - 2015 ?Nocturia - 01/07/2021, - 01/06/2020, - 2020 ?ED due to arterial insufficiency - 01/06/2020, - 2018, - 2017, Erectile dysfunction due to arterial insufficiency, - 2016 ?Microscopic hematuria - 01/06/2020, - 2020 ?BPH w/o LUTS (Stable) - 2019 ?Chronic prostatitis, Prostatitis, chronic - 2015 ?Other microscopic hematuria, Microscopic hematuria - 2014 ?  ?   ?PMH Notes:  ?2Past Gu Hx:  ? ? ?[name] [name] is 76 years of age. Over the years, the biggest issue has been an elevated PSA. Over the last several years, his PSA has been anywhere from 4.3 to  9.8 with a variable PSA-2 reading in the 17 to 31 percent range. The patient had a negative biopsy in 2002. A repeat ultrasound and biopsy in January of 2007 was also negative. Because his PSA jumped up a little further, we did a PCA-3 test on him (2008) which revealed a very low score, and therefore, a very low risk of prostate cancer. The patient has some baseline, moderate obstructive symptoms, but nothing particularly problematic. PSA in 2013 was actually  down to 4.96. In April 2014 PSA had increased again to 7.2. Rectal exam was unchanged and we felt continued observation was indicated.  ? ? ?October 2014 PSA increased to 9.9. We repeated that in January 2015 and it was 10.0. We suggested that we repeat another ultrasound and biopsy. That was performed at the outpatient surgical Center at his request. His prostate had significant calcifications. Prostate volume was felt to be 73 g. All biopsies were negative for malignancy but did show focal inflammation..  ?  ? ?NON-GU PMH: Encounter for general adult medical examination without abnormal findings, Encounter for preventive health examination - 2016 ?  ? ?FAMILY HISTORY: Family Health Status Number - Runs In Family ?No pertinent family history - Other  ? ?SOCIAL HISTORY: Marital Status: Widowed ?Preferred Language: English; Race: White ?Current Smoking Status: Patient does not smoke anymore. Has not smoked since 12/20/1980.  ?Has never drank.  ?Drinks 2 caffeinated drinks per day. ?Patient's occupation is/was Retired. ?  ?  Notes: Very active. Enjoys deer hunting   ? ?REVIEW OF SYSTEMS:    ?GU Review Male:   Patient denies frequent urination, hard to postpone urination, burning/ pain with urination, get up at night to urinate, leakage of urine, stream starts and stops, trouble starting your stream, have to strain to urinate , erection problems, and penile pain.  ?Gastrointestinal (Upper):   Patient denies nausea, vomiting, and indigestion/ heartburn.  ?Gastrointestinal (Lower):   Patient denies diarrhea and constipation.  ?Constitutional:   Patient denies fever, night sweats, weight loss, and fatigue.  ?Skin:   Patient denies skin rash/ lesion and itching.  ?Eyes:   Patient denies blurred vision and double vision.  ?Ears/ Nose/ Throat:   Patient denies sore throat and sinus problems.  ?Hematologic/Lymphatic:   Patient denies swollen glands and easy bruising.  ?Cardiovascular:   Patient denies leg swelling and chest  pains.  ?Respiratory:   Patient denies cough and shortness of breath.  ?Endocrine:   Patient denies excessive thirst.  ?Musculoskeletal:   Patient denies back pain and joint pain.  ?Neurological:   Patient denies headaches and dizziness.  ?Psychologic:   Patient denies depression and anxiety.  ? ?VITAL SIGNS: None  ? ?Complexity of Data:  ?Lab Test Review:   PSA  ?Records Review:   Pathology Reports, Previous Patient Records  ? 12/31/20 07/06/20 12/31/19 12/05/18 11/30/17 12/12/16 12/21/15 12/10/14  ?PSA  ?Total PSA 10.80 ng/mL 6.84 ng/mL 10.30 ng/mL 6.77 ng/mL 8.18 ng/mL 8.00 ng/mL 8.49  9.15   ?Free PSA     2.54 ng/mL 2.35 ng/mL 2.15  0.75   ?% Free PSA     31 % PSA 29 % PSA 25  8   ? ? 05/14/09  ?Hormones  ?Testosterone, Total 166.0   ? ? ?PROCEDURES:    ? ?     Urinalysis w/Scope ?Dipstick Dipstick Cont'd Micro  ?Color: Yellow Bilirubin: Neg mg/dL WBC/hpf: NS (Not Seen)  ?Appearance: Clear Ketones: Neg mg/dL RBC/hpf: 3 - 10/hpf  ?Specific Gravity: 1.025 Blood:  1+ ery/uL Bacteria: Rare (0-9/hpf)  ?pH: 5.5 Protein: Neg mg/dL Cystals: NS (Not Seen)  ?Glucose: Neg mg/dL Urobilinogen: 0.2 mg/dL Casts: Hyaline  ?  Nitrites: Neg Trichomonas: Not Present  ?  Leukocyte Esterase: Neg leu/uL Mucous: Present  ?    Epithelial Cells: 0 - 5/hpf  ?    Yeast: NS (Not Seen)  ?    Sperm: Not Present  ? ? ?ASSESSMENT:  ?    ICD-10 Details  ?1 GU:   Prostate Cancer - C61 Undiagnosed New Problem  ?2   BPH w/LUTS - N40.1   ?3   Nocturia - R35.1   ?  ? ?PLAN:    ? ? ?      Medications ?New Meds: Finasteride 5 mg tablet 1 tablet PO Daily   #90  3 Refill(s)  ?  ? ? ?      Orders ? ? ?      Schedule ?Labs: 6 Months - PSA  ?X-Rays: 6 Months - MRI Prostate GSORAD With and Without I.V. Contrast  ?Return Visit/Planned Activity: 6 Months - Office Visit, Follow up MD  ? ? ?      Document ?Letter(s):  Created for Patient: Clinical Summary  ? Created for [name], MD  ? ? ?     Notes:   -The patient was counseled about the natural history  of prostate cancer and the standard treatment options that are available for prostate cancer. It was explained to him how his age and life expectancy, clinical stage, Gleason score, and PSA affect his prognosis, the deci

## 2021-07-23 NOTE — Anesthesia Postprocedure Evaluation (Signed)
Anesthesia Post Note ? ?Patient: Melesio Madara ? ?Procedure(s) Performed: GOLD SEED IMPLANT (Rectum) ?SPACE OAR INSTILLATION (Rectum) ? ?  ? ?Patient location during evaluation: PACU ?Anesthesia Type: MAC ?Level of consciousness: awake and alert ?Pain management: pain level controlled ?Vital Signs Assessment: post-procedure vital signs reviewed and stable ?Respiratory status: spontaneous breathing, nonlabored ventilation, respiratory function stable and patient connected to nasal cannula oxygen ?Cardiovascular status: stable and blood pressure returned to baseline ?Postop Assessment: no apparent nausea or vomiting ?Anesthetic complications: no ? ? ?No notable events documented. ? ?Last Vitals:  ?Vitals:  ? 07/23/21 1245 07/23/21 1246  ?BP: 133/68 133/68  ?Pulse: 76 74  ?Resp: (!) 21 15  ?Temp:    ?SpO2: 99% 99%  ?  ?Last Pain:  ?Vitals:  ? 07/23/21 1246  ?TempSrc:   ?PainSc: 0-No pain  ? ? ?  ?  ?  ?  ?  ?  ? ?Gabrille Kilbride ? ? ? ? ?

## 2021-07-23 NOTE — Transfer of Care (Signed)
Immediate Anesthesia Transfer of Care Note ? ?Patient: [name] [name] ? ?Procedure(s) Performed: GOLD SEED IMPLANT (Rectum) ?SPACE OAR INSTILLATION (Rectum) ? ?Patient Location: PACU ? ?Anesthesia Type:MAC ? ?Level of Consciousness: awake, drowsy and patient cooperative ? ?Airway & Oxygen Therapy: Patient Spontanous Breathing and Patient connected to face mask oxygen ? ?Post-op Assessment: Report given to RN and Post -op Vital signs reviewed and stable ? ?Post vital signs: Reviewed and stable ? ?Last Vitals:  ?Vitals Value Taken Time  ?BP 107/60 07/23/21 1218  ?Temp    ?Pulse 73 07/23/21 1219  ?Resp 17 07/23/21 1219  ?SpO2 100 % 07/23/21 1219  ?Vitals shown include unvalidated device data. ? ?Last Pain:  ?Vitals:  ? 07/23/21 0916  ?TempSrc:   ?PainSc: 0-No pain  ?   ? ?Patients Stated Pain Goal: 3 (07/23/21 6812) ? ?Complications: No notable events documented. ?

## 2021-07-23 NOTE — Interval H&P Note (Signed)
History and Physical Interval Note: ? ?07/23/2021 ?10:21 AM ? ?[name] [name]  has presented today for surgery, with the diagnosis of PROSTATE CANCER.  The various methods of treatment have been discussed with the patient and family. After consideration of risks, benefits and other options for treatment, the patient has consented to  Procedure(s): ?GOLD SEED IMPLANT (N/A) ?SPACE OAR INSTILLATION (N/A) as a surgical intervention.  The patient's history has been reviewed, patient examined, no change in status, stable for surgery.  I have reviewed the patient's chart and labs.  Questions were answered to the patient's satisfaction.   ? ? ?Ardis Hughs ? ? ?

## 2021-07-23 NOTE — Discharge Instructions (Signed)
For several days the patient: ? ?should increase his fluid intake and limit strenuous activity. ?he might have mild discomfort at the base of his penis or in his rectum. ?he might have blood in his urine or blood in his bowel movements. ? ?For 2-3 months he might have blood in his ejaculate (semen). ? ?Call the office immedicately: ? for blood clots in the urine or bowel movements,  ?difficulty urinating,  ?inability to urinate,  ?urinary retention,  ?painful or frequent urination,  ?fever, chills,  ?nausea, vomiting, ?other illness.  ? ? ?[facility]:  [phone] ? ?

## 2021-07-23 NOTE — Telephone Encounter (Signed)
CALLED PATIENT TO REMIND OF SIM APPT. FOR 07-26-21- ARRIVAL TIME- 10:45 AM @ [facility], INFORMED PATIENT TO ARRIVE WITH A FULL BLADDER AND AN EMPTY BOWEL, LVM FOR A RETURN CALL. ?

## 2021-07-26 ENCOUNTER — Encounter (HOSPITAL_COMMUNITY): Payer: Self-pay | Admitting: Urology

## 2021-07-26 ENCOUNTER — Other Ambulatory Visit: Payer: Self-pay

## 2021-07-26 ENCOUNTER — Ambulatory Visit
Admission: RE | Admit: 2021-07-26 | Discharge: 2021-07-26 | Disposition: A | Discharge status: Home / Self Care | Payer: Medicare HMO | Source: Ambulatory Visit | Attending: Radiation Oncology | Admitting: Radiation Oncology

## 2021-07-26 DIAGNOSIS — Z510 Encounter for antineoplastic radiation therapy: Secondary | ICD-10-CM | POA: Diagnosis not present

## 2021-07-26 DIAGNOSIS — C61 Malignant neoplasm of prostate: Secondary | ICD-10-CM | POA: Insufficient documentation

## 2021-07-26 DIAGNOSIS — Z191 Hormone sensitive malignancy status: Secondary | ICD-10-CM | POA: Diagnosis not present

## 2021-07-27 NOTE — Progress Notes (Signed)
?  Radiation Oncology         [phone] ?________________________________ ? ?Name: [name] [name] MRN: [id number]  ?Date: 07/26/2021  DOB: 1945/07/28 ? ?SIMULATION AND TREATMENT PLANNING NOTE ? ?  ICD-10-CM   ?1. Prostate cancer ([place])  C61   ?  ? ? ?DIAGNOSIS:  76 y.o. gentleman with Stage T1c adenocarcinoma of the prostate with Gleason score of 3+4, and PSA of 10.8. ? ?NARRATIVE:  The patient was brought to the [facility].  Identity was confirmed.  All relevant records and images related to the planned course of therapy were reviewed.  The patient freely provided informed written consent to proceed with treatment after reviewing the details related to the planned course of therapy. The consent form was witnessed and verified by the simulation staff.  Then, the patient was set-up in a stable reproducible supine position for radiation therapy.  A vacuum lock pillow device was custom fabricated to position his legs in a reproducible immobilized position.  Then, I performed a urethrogram under sterile conditions to identify the prostatic apex.  CT images were obtained.  Surface markings were placed.  The CT images were loaded into the planning software.  Then the prostate target and avoidance structures including the rectum, bladder, bowel and hips were contoured.  Treatment planning then occurred.  The radiation prescription was entered and confirmed.  A total of one complex treatment devices was fabricated. I have requested : Intensity Modulated Radiotherapy (IMRT) is medically necessary for this case for the following reason:  Rectal sparing.. ? ?PLAN:  The patient will receive 70 Gy in 28 fractions. ? ?________________________________ ? ?Sheral Apley Tammi Klippel, M.D. ? ?

## 2021-07-29 DIAGNOSIS — C61 Malignant neoplasm of prostate: Secondary | ICD-10-CM | POA: Diagnosis not present

## 2021-07-29 DIAGNOSIS — Z191 Hormone sensitive malignancy status: Secondary | ICD-10-CM | POA: Diagnosis not present

## 2021-07-29 DIAGNOSIS — Z510 Encounter for antineoplastic radiation therapy: Secondary | ICD-10-CM | POA: Diagnosis not present

## 2021-08-02 ENCOUNTER — Other Ambulatory Visit: Payer: Self-pay | Admitting: Family Medicine

## 2021-08-05 ENCOUNTER — Other Ambulatory Visit: Payer: Self-pay

## 2021-08-05 ENCOUNTER — Ambulatory Visit
Admission: RE | Admit: 2021-08-05 | Discharge: 2021-08-05 | Disposition: A | Discharge status: Home / Self Care | Payer: Medicare HMO | Source: Ambulatory Visit | Attending: Radiation Oncology | Admitting: Radiation Oncology

## 2021-08-05 DIAGNOSIS — Z510 Encounter for antineoplastic radiation therapy: Secondary | ICD-10-CM | POA: Diagnosis not present

## 2021-08-05 DIAGNOSIS — Z191 Hormone sensitive malignancy status: Secondary | ICD-10-CM | POA: Diagnosis not present

## 2021-08-05 DIAGNOSIS — C61 Malignant neoplasm of prostate: Secondary | ICD-10-CM | POA: Diagnosis not present

## 2021-08-05 LAB — RAD ONC ARIA SESSION SUMMARY
Course Elapsed Days: 0
Plan Fractions Treated to Date: 1
Plan Prescribed Dose Per Fraction: 2.5 Gy
Plan Total Fractions Prescribed: 28
Plan Total Prescribed Dose: 70 Gy
Reference Point Dosage Given to Date: 2.5 Gy
Reference Point Session Dosage Given: 2.5 Gy
Session Number: 1

## 2021-08-06 ENCOUNTER — Other Ambulatory Visit: Payer: Self-pay | Admitting: Urology

## 2021-08-06 ENCOUNTER — Other Ambulatory Visit: Payer: Self-pay

## 2021-08-06 ENCOUNTER — Ambulatory Visit
Admission: RE | Admit: 2021-08-06 | Discharge: 2021-08-06 | Disposition: A | Discharge status: Home / Self Care | Payer: Medicare HMO | Source: Ambulatory Visit | Attending: Radiation Oncology | Admitting: Radiation Oncology

## 2021-08-06 DIAGNOSIS — C61 Malignant neoplasm of prostate: Secondary | ICD-10-CM

## 2021-08-06 DIAGNOSIS — Z510 Encounter for antineoplastic radiation therapy: Secondary | ICD-10-CM | POA: Diagnosis not present

## 2021-08-06 DIAGNOSIS — Z191 Hormone sensitive malignancy status: Secondary | ICD-10-CM | POA: Diagnosis not present

## 2021-08-06 LAB — RAD ONC ARIA SESSION SUMMARY
Course Elapsed Days: 1
Plan Fractions Treated to Date: 2
Plan Prescribed Dose Per Fraction: 2.5 Gy
Plan Total Fractions Prescribed: 28
Plan Total Prescribed Dose: 70 Gy
Reference Point Dosage Given to Date: 5 Gy
Reference Point Session Dosage Given: 2.5 Gy
Session Number: 2

## 2021-08-09 ENCOUNTER — Other Ambulatory Visit: Payer: Self-pay

## 2021-08-09 ENCOUNTER — Ambulatory Visit
Admission: RE | Admit: 2021-08-09 | Discharge: 2021-08-09 | Disposition: A | Discharge status: Home / Self Care | Payer: Medicare HMO | Source: Ambulatory Visit | Attending: Radiation Oncology | Admitting: Radiation Oncology

## 2021-08-09 DIAGNOSIS — H43812 Vitreous degeneration, left eye: Secondary | ICD-10-CM | POA: Diagnosis not present

## 2021-08-09 DIAGNOSIS — D3132 Benign neoplasm of left choroid: Secondary | ICD-10-CM | POA: Diagnosis not present

## 2021-08-09 DIAGNOSIS — H02834 Dermatochalasis of left upper eyelid: Secondary | ICD-10-CM | POA: Diagnosis not present

## 2021-08-09 DIAGNOSIS — H2513 Age-related nuclear cataract, bilateral: Secondary | ICD-10-CM | POA: Diagnosis not present

## 2021-08-09 DIAGNOSIS — Z510 Encounter for antineoplastic radiation therapy: Secondary | ICD-10-CM | POA: Diagnosis not present

## 2021-08-09 DIAGNOSIS — Z191 Hormone sensitive malignancy status: Secondary | ICD-10-CM | POA: Diagnosis not present

## 2021-08-09 DIAGNOSIS — H02831 Dermatochalasis of right upper eyelid: Secondary | ICD-10-CM | POA: Diagnosis not present

## 2021-08-09 DIAGNOSIS — C61 Malignant neoplasm of prostate: Secondary | ICD-10-CM | POA: Diagnosis not present

## 2021-08-09 LAB — RAD ONC ARIA SESSION SUMMARY
Course Elapsed Days: 4
Plan Fractions Treated to Date: 3
Plan Prescribed Dose Per Fraction: 2.5 Gy
Plan Total Fractions Prescribed: 28
Plan Total Prescribed Dose: 70 Gy
Reference Point Dosage Given to Date: 7.5 Gy
Reference Point Session Dosage Given: 2.5 Gy
Session Number: 3

## 2021-08-10 ENCOUNTER — Ambulatory Visit
Admission: RE | Admit: 2021-08-10 | Discharge: 2021-08-10 | Disposition: A | Discharge status: Home / Self Care | Payer: Medicare HMO | Source: Ambulatory Visit | Attending: Radiation Oncology | Admitting: Radiation Oncology

## 2021-08-10 ENCOUNTER — Other Ambulatory Visit: Payer: Self-pay

## 2021-08-10 DIAGNOSIS — C61 Malignant neoplasm of prostate: Secondary | ICD-10-CM | POA: Diagnosis not present

## 2021-08-10 DIAGNOSIS — R6889 Other general symptoms and signs: Secondary | ICD-10-CM | POA: Diagnosis not present

## 2021-08-10 DIAGNOSIS — Z191 Hormone sensitive malignancy status: Secondary | ICD-10-CM | POA: Diagnosis not present

## 2021-08-10 DIAGNOSIS — Z510 Encounter for antineoplastic radiation therapy: Secondary | ICD-10-CM | POA: Diagnosis not present

## 2021-08-10 LAB — RAD ONC ARIA SESSION SUMMARY
Course Elapsed Days: 5
Plan Fractions Treated to Date: 4
Plan Prescribed Dose Per Fraction: 2.5 Gy
Plan Total Fractions Prescribed: 28
Plan Total Prescribed Dose: 70 Gy
Reference Point Dosage Given to Date: 10 Gy
Reference Point Session Dosage Given: 2.5 Gy
Session Number: 4

## 2021-08-11 ENCOUNTER — Other Ambulatory Visit: Payer: Self-pay

## 2021-08-11 ENCOUNTER — Ambulatory Visit
Admission: RE | Admit: 2021-08-11 | Discharge: 2021-08-11 | Disposition: A | Discharge status: Home / Self Care | Payer: Medicare HMO | Source: Ambulatory Visit | Attending: Radiation Oncology | Admitting: Radiation Oncology

## 2021-08-11 DIAGNOSIS — Z510 Encounter for antineoplastic radiation therapy: Secondary | ICD-10-CM | POA: Diagnosis not present

## 2021-08-11 DIAGNOSIS — Z191 Hormone sensitive malignancy status: Secondary | ICD-10-CM | POA: Diagnosis not present

## 2021-08-11 DIAGNOSIS — R6889 Other general symptoms and signs: Secondary | ICD-10-CM | POA: Diagnosis not present

## 2021-08-11 DIAGNOSIS — C61 Malignant neoplasm of prostate: Secondary | ICD-10-CM | POA: Diagnosis not present

## 2021-08-11 LAB — RAD ONC ARIA SESSION SUMMARY
Course Elapsed Days: 6
Plan Fractions Treated to Date: 5
Plan Prescribed Dose Per Fraction: 2.5 Gy
Plan Total Fractions Prescribed: 28
Plan Total Prescribed Dose: 70 Gy
Reference Point Dosage Given to Date: 12.5 Gy
Reference Point Session Dosage Given: 2.5 Gy
Session Number: 5

## 2021-08-12 ENCOUNTER — Ambulatory Visit
Admission: RE | Admit: 2021-08-12 | Discharge: 2021-08-12 | Disposition: A | Discharge status: Home / Self Care | Payer: Medicare HMO | Source: Ambulatory Visit | Attending: Radiation Oncology | Admitting: Radiation Oncology

## 2021-08-12 ENCOUNTER — Other Ambulatory Visit: Payer: Self-pay

## 2021-08-12 DIAGNOSIS — Z510 Encounter for antineoplastic radiation therapy: Secondary | ICD-10-CM | POA: Diagnosis not present

## 2021-08-12 DIAGNOSIS — R6889 Other general symptoms and signs: Secondary | ICD-10-CM | POA: Diagnosis not present

## 2021-08-12 DIAGNOSIS — C61 Malignant neoplasm of prostate: Secondary | ICD-10-CM | POA: Diagnosis not present

## 2021-08-12 DIAGNOSIS — Z191 Hormone sensitive malignancy status: Secondary | ICD-10-CM | POA: Diagnosis not present

## 2021-08-12 LAB — RAD ONC ARIA SESSION SUMMARY
Course Elapsed Days: 7
Plan Fractions Treated to Date: 6
Plan Prescribed Dose Per Fraction: 2.5 Gy
Plan Total Fractions Prescribed: 28
Plan Total Prescribed Dose: 70 Gy
Reference Point Dosage Given to Date: 15 Gy
Reference Point Session Dosage Given: 2.5 Gy
Session Number: 6

## 2021-08-13 ENCOUNTER — Other Ambulatory Visit: Payer: Self-pay

## 2021-08-13 ENCOUNTER — Ambulatory Visit
Admission: RE | Admit: 2021-08-13 | Discharge: 2021-08-13 | Disposition: A | Discharge status: Home / Self Care | Payer: Medicare HMO | Source: Ambulatory Visit | Attending: Radiation Oncology | Admitting: Radiation Oncology

## 2021-08-13 DIAGNOSIS — R6889 Other general symptoms and signs: Secondary | ICD-10-CM | POA: Diagnosis not present

## 2021-08-13 DIAGNOSIS — C61 Malignant neoplasm of prostate: Secondary | ICD-10-CM | POA: Diagnosis not present

## 2021-08-13 DIAGNOSIS — Z191 Hormone sensitive malignancy status: Secondary | ICD-10-CM | POA: Diagnosis not present

## 2021-08-13 DIAGNOSIS — Z510 Encounter for antineoplastic radiation therapy: Secondary | ICD-10-CM | POA: Diagnosis not present

## 2021-08-13 LAB — RAD ONC ARIA SESSION SUMMARY
Course Elapsed Days: 8
Plan Fractions Treated to Date: 7
Plan Prescribed Dose Per Fraction: 2.5 Gy
Plan Total Fractions Prescribed: 28
Plan Total Prescribed Dose: 70 Gy
Reference Point Dosage Given to Date: 17.5 Gy
Reference Point Session Dosage Given: 2.5 Gy
Session Number: 7

## 2021-08-16 ENCOUNTER — Other Ambulatory Visit: Payer: Self-pay

## 2021-08-16 ENCOUNTER — Ambulatory Visit
Admission: RE | Admit: 2021-08-16 | Discharge: 2021-08-16 | Disposition: A | Discharge status: Home / Self Care | Payer: Medicare HMO | Source: Ambulatory Visit | Attending: Radiation Oncology | Admitting: Radiation Oncology

## 2021-08-16 DIAGNOSIS — R6889 Other general symptoms and signs: Secondary | ICD-10-CM | POA: Diagnosis not present

## 2021-08-16 DIAGNOSIS — C61 Malignant neoplasm of prostate: Secondary | ICD-10-CM | POA: Diagnosis not present

## 2021-08-16 DIAGNOSIS — Z191 Hormone sensitive malignancy status: Secondary | ICD-10-CM | POA: Diagnosis not present

## 2021-08-16 DIAGNOSIS — Z510 Encounter for antineoplastic radiation therapy: Secondary | ICD-10-CM | POA: Diagnosis not present

## 2021-08-16 LAB — RAD ONC ARIA SESSION SUMMARY
Course Elapsed Days: 11
Plan Fractions Treated to Date: 8
Plan Prescribed Dose Per Fraction: 2.5 Gy
Plan Total Fractions Prescribed: 28
Plan Total Prescribed Dose: 70 Gy
Reference Point Dosage Given to Date: 20 Gy
Reference Point Session Dosage Given: 2.5 Gy
Session Number: 8

## 2021-08-17 ENCOUNTER — Ambulatory Visit
Admission: RE | Admit: 2021-08-17 | Discharge: 2021-08-17 | Disposition: A | Discharge status: Home / Self Care | Payer: Medicare HMO | Source: Ambulatory Visit | Attending: Radiation Oncology | Admitting: Radiation Oncology

## 2021-08-17 ENCOUNTER — Other Ambulatory Visit: Payer: Self-pay

## 2021-08-17 DIAGNOSIS — C61 Malignant neoplasm of prostate: Secondary | ICD-10-CM | POA: Diagnosis not present

## 2021-08-17 DIAGNOSIS — R6889 Other general symptoms and signs: Secondary | ICD-10-CM | POA: Diagnosis not present

## 2021-08-17 DIAGNOSIS — Z191 Hormone sensitive malignancy status: Secondary | ICD-10-CM | POA: Diagnosis not present

## 2021-08-17 DIAGNOSIS — Z510 Encounter for antineoplastic radiation therapy: Secondary | ICD-10-CM | POA: Diagnosis not present

## 2021-08-17 LAB — RAD ONC ARIA SESSION SUMMARY
Course Elapsed Days: 12
Plan Fractions Treated to Date: 9
Plan Prescribed Dose Per Fraction: 2.5 Gy
Plan Total Fractions Prescribed: 28
Plan Total Prescribed Dose: 70 Gy
Reference Point Dosage Given to Date: 22.5 Gy
Reference Point Session Dosage Given: 2.5 Gy
Session Number: 9

## 2021-08-18 ENCOUNTER — Ambulatory Visit
Admission: RE | Admit: 2021-08-18 | Discharge: 2021-08-18 | Disposition: A | Discharge status: Home / Self Care | Payer: Medicare HMO | Source: Ambulatory Visit | Attending: Radiation Oncology | Admitting: Radiation Oncology

## 2021-08-18 ENCOUNTER — Other Ambulatory Visit: Payer: Self-pay

## 2021-08-18 DIAGNOSIS — H52209 Unspecified astigmatism, unspecified eye: Secondary | ICD-10-CM | POA: Diagnosis not present

## 2021-08-18 DIAGNOSIS — C61 Malignant neoplasm of prostate: Secondary | ICD-10-CM | POA: Diagnosis not present

## 2021-08-18 DIAGNOSIS — Z191 Hormone sensitive malignancy status: Secondary | ICD-10-CM | POA: Diagnosis not present

## 2021-08-18 DIAGNOSIS — R6889 Other general symptoms and signs: Secondary | ICD-10-CM | POA: Diagnosis not present

## 2021-08-18 DIAGNOSIS — H5203 Hypermetropia, bilateral: Secondary | ICD-10-CM | POA: Diagnosis not present

## 2021-08-18 DIAGNOSIS — Z510 Encounter for antineoplastic radiation therapy: Secondary | ICD-10-CM | POA: Diagnosis not present

## 2021-08-18 LAB — RAD ONC ARIA SESSION SUMMARY
Course Elapsed Days: 13
Plan Fractions Treated to Date: 10
Plan Prescribed Dose Per Fraction: 2.5 Gy
Plan Total Fractions Prescribed: 28
Plan Total Prescribed Dose: 70 Gy
Reference Point Dosage Given to Date: 25 Gy
Reference Point Session Dosage Given: 2.5 Gy
Session Number: 10

## 2021-08-19 ENCOUNTER — Other Ambulatory Visit: Payer: Self-pay

## 2021-08-19 ENCOUNTER — Ambulatory Visit
Admission: RE | Admit: 2021-08-19 | Discharge: 2021-08-19 | Disposition: A | Discharge status: Home / Self Care | Payer: Medicare HMO | Source: Ambulatory Visit | Attending: Radiation Oncology | Admitting: Radiation Oncology

## 2021-08-19 DIAGNOSIS — R6889 Other general symptoms and signs: Secondary | ICD-10-CM | POA: Diagnosis not present

## 2021-08-19 DIAGNOSIS — Z510 Encounter for antineoplastic radiation therapy: Secondary | ICD-10-CM | POA: Diagnosis not present

## 2021-08-19 DIAGNOSIS — Z191 Hormone sensitive malignancy status: Secondary | ICD-10-CM | POA: Diagnosis not present

## 2021-08-19 DIAGNOSIS — C61 Malignant neoplasm of prostate: Secondary | ICD-10-CM | POA: Diagnosis not present

## 2021-08-19 LAB — RAD ONC ARIA SESSION SUMMARY
Course Elapsed Days: 14
Plan Fractions Treated to Date: 11
Plan Prescribed Dose Per Fraction: 2.5 Gy
Plan Total Fractions Prescribed: 28
Plan Total Prescribed Dose: 70 Gy
Reference Point Dosage Given to Date: 27.5 Gy
Reference Point Session Dosage Given: 2.5 Gy
Session Number: 11

## 2021-08-20 ENCOUNTER — Ambulatory Visit
Admission: RE | Admit: 2021-08-20 | Discharge: 2021-08-20 | Disposition: A | Discharge status: Home / Self Care | Payer: Medicare HMO | Source: Ambulatory Visit | Attending: Radiation Oncology | Admitting: Radiation Oncology

## 2021-08-20 ENCOUNTER — Other Ambulatory Visit: Payer: Self-pay

## 2021-08-20 DIAGNOSIS — R6889 Other general symptoms and signs: Secondary | ICD-10-CM | POA: Diagnosis not present

## 2021-08-20 DIAGNOSIS — C61 Malignant neoplasm of prostate: Secondary | ICD-10-CM | POA: Diagnosis not present

## 2021-08-20 DIAGNOSIS — Z510 Encounter for antineoplastic radiation therapy: Secondary | ICD-10-CM | POA: Diagnosis not present

## 2021-08-20 DIAGNOSIS — Z191 Hormone sensitive malignancy status: Secondary | ICD-10-CM | POA: Diagnosis not present

## 2021-08-20 LAB — RAD ONC ARIA SESSION SUMMARY
Course Elapsed Days: 15
Plan Fractions Treated to Date: 12
Plan Prescribed Dose Per Fraction: 2.5 Gy
Plan Total Fractions Prescribed: 28
Plan Total Prescribed Dose: 70 Gy
Reference Point Dosage Given to Date: 30 Gy
Reference Point Session Dosage Given: 2.5 Gy
Session Number: 12

## 2021-08-24 ENCOUNTER — Other Ambulatory Visit: Payer: Self-pay

## 2021-08-24 ENCOUNTER — Ambulatory Visit
Admission: RE | Admit: 2021-08-24 | Discharge: 2021-08-24 | Disposition: A | Discharge status: Home / Self Care | Payer: Medicare HMO | Source: Ambulatory Visit | Attending: Radiation Oncology | Admitting: Radiation Oncology

## 2021-08-24 DIAGNOSIS — R6889 Other general symptoms and signs: Secondary | ICD-10-CM | POA: Diagnosis not present

## 2021-08-24 DIAGNOSIS — Z510 Encounter for antineoplastic radiation therapy: Secondary | ICD-10-CM | POA: Diagnosis not present

## 2021-08-24 DIAGNOSIS — Z191 Hormone sensitive malignancy status: Secondary | ICD-10-CM | POA: Diagnosis not present

## 2021-08-24 DIAGNOSIS — C61 Malignant neoplasm of prostate: Secondary | ICD-10-CM | POA: Diagnosis not present

## 2021-08-24 LAB — RAD ONC ARIA SESSION SUMMARY
Course Elapsed Days: 19
Plan Fractions Treated to Date: 13
Plan Prescribed Dose Per Fraction: 2.5 Gy
Plan Total Fractions Prescribed: 28
Plan Total Prescribed Dose: 70 Gy
Reference Point Dosage Given to Date: 32.5 Gy
Reference Point Session Dosage Given: 2.5 Gy
Session Number: 13

## 2021-08-25 ENCOUNTER — Ambulatory Visit
Admission: RE | Admit: 2021-08-25 | Discharge: 2021-08-25 | Disposition: A | Discharge status: Home / Self Care | Payer: Medicare HMO | Source: Ambulatory Visit | Attending: Radiation Oncology | Admitting: Radiation Oncology

## 2021-08-25 ENCOUNTER — Other Ambulatory Visit: Payer: Self-pay

## 2021-08-25 DIAGNOSIS — R6889 Other general symptoms and signs: Secondary | ICD-10-CM | POA: Diagnosis not present

## 2021-08-25 DIAGNOSIS — Z191 Hormone sensitive malignancy status: Secondary | ICD-10-CM | POA: Diagnosis not present

## 2021-08-25 DIAGNOSIS — Z510 Encounter for antineoplastic radiation therapy: Secondary | ICD-10-CM | POA: Diagnosis not present

## 2021-08-25 DIAGNOSIS — C61 Malignant neoplasm of prostate: Secondary | ICD-10-CM | POA: Diagnosis not present

## 2021-08-25 LAB — RAD ONC ARIA SESSION SUMMARY
Course Elapsed Days: 20
Plan Fractions Treated to Date: 14
Plan Prescribed Dose Per Fraction: 2.5 Gy
Plan Total Fractions Prescribed: 28
Plan Total Prescribed Dose: 70 Gy
Reference Point Dosage Given to Date: 35 Gy
Reference Point Session Dosage Given: 2.5 Gy
Session Number: 14

## 2021-08-26 ENCOUNTER — Ambulatory Visit
Admission: RE | Admit: 2021-08-26 | Discharge: 2021-08-26 | Disposition: A | Discharge status: Home / Self Care | Payer: Medicare HMO | Source: Ambulatory Visit | Attending: Radiation Oncology | Admitting: Radiation Oncology

## 2021-08-26 ENCOUNTER — Other Ambulatory Visit: Payer: Self-pay

## 2021-08-26 DIAGNOSIS — C61 Malignant neoplasm of prostate: Secondary | ICD-10-CM | POA: Insufficient documentation

## 2021-08-26 DIAGNOSIS — R6889 Other general symptoms and signs: Secondary | ICD-10-CM | POA: Diagnosis not present

## 2021-08-26 DIAGNOSIS — Z191 Hormone sensitive malignancy status: Secondary | ICD-10-CM | POA: Diagnosis not present

## 2021-08-26 DIAGNOSIS — Z510 Encounter for antineoplastic radiation therapy: Secondary | ICD-10-CM | POA: Insufficient documentation

## 2021-08-26 LAB — RAD ONC ARIA SESSION SUMMARY
Course Elapsed Days: 21
Plan Fractions Treated to Date: 15
Plan Prescribed Dose Per Fraction: 2.5 Gy
Plan Total Fractions Prescribed: 28
Plan Total Prescribed Dose: 70 Gy
Reference Point Dosage Given to Date: 37.5 Gy
Reference Point Session Dosage Given: 2.5 Gy
Session Number: 15

## 2021-08-27 ENCOUNTER — Ambulatory Visit
Admission: RE | Admit: 2021-08-27 | Discharge: 2021-08-27 | Disposition: A | Discharge status: Home / Self Care | Payer: Medicare HMO | Source: Ambulatory Visit | Attending: Radiation Oncology | Admitting: Radiation Oncology

## 2021-08-27 ENCOUNTER — Ambulatory Visit: Payer: Medicare HMO | Admitting: Family Medicine

## 2021-08-27 ENCOUNTER — Other Ambulatory Visit: Payer: Self-pay

## 2021-08-27 DIAGNOSIS — C61 Malignant neoplasm of prostate: Secondary | ICD-10-CM | POA: Diagnosis not present

## 2021-08-27 DIAGNOSIS — Z191 Hormone sensitive malignancy status: Secondary | ICD-10-CM | POA: Diagnosis not present

## 2021-08-27 DIAGNOSIS — Z510 Encounter for antineoplastic radiation therapy: Secondary | ICD-10-CM | POA: Diagnosis not present

## 2021-08-27 LAB — RAD ONC ARIA SESSION SUMMARY
Course Elapsed Days: 22
Plan Fractions Treated to Date: 16
Plan Prescribed Dose Per Fraction: 2.5 Gy
Plan Total Fractions Prescribed: 28
Plan Total Prescribed Dose: 70 Gy
Reference Point Dosage Given to Date: 40 Gy
Reference Point Session Dosage Given: 2.5 Gy
Session Number: 16

## 2021-08-30 ENCOUNTER — Other Ambulatory Visit: Payer: Self-pay

## 2021-08-30 ENCOUNTER — Ambulatory Visit
Admission: RE | Admit: 2021-08-30 | Discharge: 2021-08-30 | Disposition: A | Discharge status: Home / Self Care | Payer: Medicare HMO | Source: Ambulatory Visit | Attending: Radiation Oncology | Admitting: Radiation Oncology

## 2021-08-30 DIAGNOSIS — Z191 Hormone sensitive malignancy status: Secondary | ICD-10-CM | POA: Diagnosis not present

## 2021-08-30 DIAGNOSIS — Z510 Encounter for antineoplastic radiation therapy: Secondary | ICD-10-CM | POA: Diagnosis not present

## 2021-08-30 DIAGNOSIS — C61 Malignant neoplasm of prostate: Secondary | ICD-10-CM | POA: Diagnosis not present

## 2021-08-30 LAB — RAD ONC ARIA SESSION SUMMARY
Course Elapsed Days: 25
Plan Fractions Treated to Date: 17
Plan Prescribed Dose Per Fraction: 2.5 Gy
Plan Total Fractions Prescribed: 28
Plan Total Prescribed Dose: 70 Gy
Reference Point Dosage Given to Date: 42.5 Gy
Reference Point Session Dosage Given: 2.5 Gy
Session Number: 17

## 2021-08-31 ENCOUNTER — Ambulatory Visit
Admission: RE | Admit: 2021-08-31 | Discharge: 2021-08-31 | Disposition: A | Discharge status: Home / Self Care | Payer: Medicare HMO | Source: Ambulatory Visit | Attending: Radiation Oncology | Admitting: Radiation Oncology

## 2021-08-31 ENCOUNTER — Other Ambulatory Visit: Payer: Self-pay

## 2021-08-31 DIAGNOSIS — Z191 Hormone sensitive malignancy status: Secondary | ICD-10-CM | POA: Diagnosis not present

## 2021-08-31 DIAGNOSIS — Z510 Encounter for antineoplastic radiation therapy: Secondary | ICD-10-CM | POA: Diagnosis not present

## 2021-08-31 DIAGNOSIS — C61 Malignant neoplasm of prostate: Secondary | ICD-10-CM | POA: Diagnosis not present

## 2021-08-31 LAB — RAD ONC ARIA SESSION SUMMARY
Course Elapsed Days: 26
Plan Fractions Treated to Date: 18
Plan Prescribed Dose Per Fraction: 2.5 Gy
Plan Total Fractions Prescribed: 28
Plan Total Prescribed Dose: 70 Gy
Reference Point Dosage Given to Date: 45 Gy
Reference Point Session Dosage Given: 2.5 Gy
Session Number: 18

## 2021-09-01 ENCOUNTER — Ambulatory Visit (INDEPENDENT_AMBULATORY_CARE_PROVIDER_SITE_OTHER): Payer: Medicare HMO | Admitting: Family Medicine

## 2021-09-01 ENCOUNTER — Other Ambulatory Visit: Payer: Self-pay

## 2021-09-01 ENCOUNTER — Encounter: Payer: Self-pay | Admitting: Family Medicine

## 2021-09-01 ENCOUNTER — Ambulatory Visit
Admission: RE | Admit: 2021-09-01 | Discharge: 2021-09-01 | Disposition: A | Discharge status: Home / Self Care | Payer: Medicare HMO | Source: Ambulatory Visit | Attending: Radiation Oncology | Admitting: Radiation Oncology

## 2021-09-01 VITALS — BP 112/68 | HR 70 | Temp 98.3°F | Resp 18 | Ht 69.0 in | Wt 185.0 lb

## 2021-09-01 DIAGNOSIS — E781 Pure hyperglyceridemia: Secondary | ICD-10-CM

## 2021-09-01 DIAGNOSIS — Z191 Hormone sensitive malignancy status: Secondary | ICD-10-CM | POA: Diagnosis not present

## 2021-09-01 DIAGNOSIS — I428 Other cardiomyopathies: Secondary | ICD-10-CM | POA: Diagnosis not present

## 2021-09-01 DIAGNOSIS — R7303 Prediabetes: Secondary | ICD-10-CM

## 2021-09-01 DIAGNOSIS — M109 Gout, unspecified: Secondary | ICD-10-CM

## 2021-09-01 DIAGNOSIS — C61 Malignant neoplasm of prostate: Secondary | ICD-10-CM

## 2021-09-01 DIAGNOSIS — G6289 Other specified polyneuropathies: Secondary | ICD-10-CM | POA: Diagnosis not present

## 2021-09-01 DIAGNOSIS — Z510 Encounter for antineoplastic radiation therapy: Secondary | ICD-10-CM | POA: Diagnosis not present

## 2021-09-01 LAB — COMPREHENSIVE METABOLIC PANEL
ALT: 18 U/L (ref 0–53)
AST: 15 U/L (ref 0–37)
Albumin: 4.5 g/dL (ref 3.5–5.2)
Alkaline Phosphatase: 61 U/L (ref 39–117)
BUN: 15 mg/dL (ref 6–23)
CO2: 31 mEq/L (ref 19–32)
Calcium: 9.7 mg/dL (ref 8.4–10.5)
Chloride: 102 mEq/L (ref 96–112)
Creatinine, Ser: 1.08 mg/dL (ref 0.40–1.50)
GFR: 66.27 mL/min (ref >60.00)
Glucose, Bld: 97 mg/dL (ref 70–99)
Potassium: 4.6 mEq/L (ref 3.5–5.1)
Sodium: 138 mEq/L (ref 135–145)
Total Bilirubin: 0.5 mg/dL (ref 0.2–1.2)
Total Protein: 7.1 g/dL (ref 6.0–8.3)

## 2021-09-01 LAB — RAD ONC ARIA SESSION SUMMARY
Course Elapsed Days: 27
Plan Fractions Treated to Date: 19
Plan Prescribed Dose Per Fraction: 2.5 Gy
Plan Total Fractions Prescribed: 28
Plan Total Prescribed Dose: 70 Gy
Reference Point Dosage Given to Date: 47.5 Gy
Reference Point Session Dosage Given: 2.5 Gy
Session Number: 19

## 2021-09-01 LAB — LIPID PANEL
Cholesterol: 115 mg/dL (ref 0–200)
HDL: 35.9 mg/dL — ABNORMAL LOW (ref >39.00)
LDL Cholesterol: 44 mg/dL (ref 0–99)
NonHDL: 79.49
Total CHOL/HDL Ratio: 3
Triglycerides: 178 mg/dL — ABNORMAL HIGH (ref 0.0–149.0)
VLDL: 35.6 mg/dL (ref 0.0–40.0)

## 2021-09-01 NOTE — Patient Instructions (Addendum)
   No med changes today. If you do have a flare of gout let me know, but can use colchicine if needed. Avoid use of rosuvastatin and colchicine combo.  Hang in there with radiation.  Not unexpected to have fatigue during this time but please let your specialist know if any questions. Take care.  Return to the clinic or go to the nearest emergency room if any of your symptoms worsen or new symptoms occur.   If you have lab work done today you will be contacted with your lab results within the next 2 weeks.  If you have not heard from [place] then please contact us. The fastest way to get your results is to register for My Chart.   IF you received an x-ray today, you will receive an invoice from [facility]. Please contact [facility] at [phone] with questions or concerns regarding your invoice.   IF you received labwork today, you will receive an invoice from [place]. Please contact [facility] at [phone] with questions or concerns regarding your invoice.   Our billing staff will not be able to assist you with questions regarding bills from these companies.  You will be contacted with the lab results as soon as they are available. The fastest way to get your results is to activate your My Chart account. Instructions are located on the last page of this paperwork. If you have not heard from [place] regarding the results in 2 weeks, please contact this office.

## 2021-09-01 NOTE — Progress Notes (Signed)
Subjective:  Patient ID: [name] [name], male    DOB: June 05, 1945  Age: 76 y.o. MRN: [id number]  CC:  Chief Complaint  Patient presents with   Follow-up    Patient states he is here for a 6 month follow up .    HPI [name] [name] presents for   BPH, prostate cancer. Followed by urology,  Dr. Lovena Neighbours, Dr. Louis Meckel, stage T1 adenocarcinoma prostate with Gleason score 3+4 and PSA of 10.8.  Treated with radiation, Dr. Tammi Klippel.  Gold seed implant, OAR instillation on April 28. Fatigue with radiation. Over half way point. Few more weeks.   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   Cardiologist Dr. Ellyn Hack, note reviewed from February.  EF 20 to 25% with normal coronary arteries by cath.  Continued on carvedilol 3.125 mg twice daily, furosemide 2 times a week with as needed dosing, started on hydralazine 10 mg twice daily and 85-monthfollow-up planned.  Option to titrate hydralazine versus adding spironolactone.  Discussing biventricular ICD.  ACE inhibitor allergy.  Nitrates deferred with use of sildenafil. Tolerating hydralazine.  Fatigue with radiation, no CP/palpitations or dyspnea.  Lasix 2 times per week for swelling working ok.   BP Readings from Last 3 Encounters:  09/01/21 112/68  07/23/21 133/68  07/12/21 118/76   Hyperlipidemia: cardiac history above.  Crestor 10 mg daily. No myalgias/side effects. Fasting today.  Lab Results  Component Value Date   CHOL 119 02/26/2021   HDL 40.40 02/26/2021   LDLCALC 60 02/26/2021   TRIG 95.0 02/26/2021   CHOLHDL 3 02/26/2021   Lab Results  Component Value Date   ALT 18 02/26/2021   AST 14 02/26/2021   ALKPHOS 56 02/26/2021   BILITOT 0.6 02/26/2021    Peripheral neuropathy Polyneuropathy treated with gabapentin 4 to 6 pills/day.  Depending on activity levels.  History of spinal stenosis, occipital neuralgia, neuropathic pain has been stable.  Most recent use of gabapentin  approximately Only needing 2 per day usually, then if more active up to 6 per day (2 tid). Stable.   Prediabetes: A1c has been improving, 6.9 in June 2022, 6.5 in December.  Most recently in April 6.2. has continue to watch diet and cutting back on sugar - min in coffee.  Lab Results  Component Value Date   HGBA1C 6.2 (H) 07/12/2021   Wt Readings from Last 3 Encounters:  09/01/21 185 lb (83.9 kg)  07/23/21 185 lb (83.9 kg)  07/12/21 185 lb (83.9 kg)   Gout: Last flare:none recently Daily meds:none.  Prn med: Colchicine prescribed last visit if needed - has not needed.  Lab Results  Component Value Date   LABURIC 6.3 02/26/2021       History Patient Active Problem List   Diagnosis Date Noted   Prostate cancer ([place] 04/02/2021   Chronic HFrEF (heart failure with reduced ejection fraction) ([place] 05/20/2020   Claudication ([place] 09/01/2019   S/P cervical spinal fusion 04/20/2018   Paresthesia 01/10/2018   Neck pain 01/10/2018   LBBB (left bundle branch block) 09/07/2017   Other meniscus derangements, posterior horn of medial meniscus, left knee, insufficency fracture medial tibial plateau 04/07/2017   Closed nondisplaced fracture of lateral condyle of left tibia with routine healing 04/07/2017   Status post arthroscopy of left knee 04/07/2017   Insomnia 09/14/2015   Hyperlipidemia with target LDL less than 100 09/11/2014   Rotator cuff tendinitis 04/28/2014   Obesity (BMI 30-39.9) 12/29/2013   Osteoarthritis  of cervical spine 12/19/2013   Exertional shortness of breath 06/26/2012   Polyp of colon, adenomatous 04/27/2012   Elevated PSA 10/20/2011   Hypertriglyceridemia 07/08/2011   Nonischemic cardiomyopathy ([place]) 09/04/2009   Past Medical History:  Diagnosis Date   Arthritis    BPH (benign prostatic hypertrophy)    CHF (congestive heart failure) (HCC)    Coronary atherosclerosis of native coronary vessel    CATH 2004--  MINIMAL NONOBSTRUCTIVE LAD/  NORMAL EF/     CARDIAC CT  03/2008  NO SIGNIFICANT DISEASE  AND  nlef   Elevated PSA    Eye problem 03/06/2016   [name] retinal detachment left eye, no sx done   History of gout    pt 05-20-2013 states stable    History of melanoma excision    2011-- LEFT UPPER BACK   Hyperlipemia    Hypertriglyceridemia    Left bundle branch block    08/2017   Melanoma ([place]) 2011   back   Nonischemic cardiomyopathy ([place])    09-04-2009  --  2D echo-EF 40-45%, Global Hypokinesis - 09/2017 -> EF 45-50%    Numbness    hands   Personal history of arthritis    Personal history of other diseases of circulatory system    Pre-diabetes    Past Surgical History:  Procedure Laterality Date   ANTERIOR CERVICAL DECOMP/DISCECTOMY FUSION N/A 04/20/2018   Procedure: Anterior Cervical Decompression Fusion - Cervical Three-Cervical Four - Cervical Four-Cervical Five - Cervical Five-Cervical Six;  Surgeon: Eustace Moore, MD;  Location: [facility];  Service: Neurosurgery;  Laterality: N/A;  Anterior Cervical Decompression Fusion - Cervical Three-Cervical Four - Cervical Four-Cervical Five - Cervical Five-Cervical Six   APPENDECTOMY  AS CHILD   BACK SURGERY     CARDIAC CATHETERIZATION  05/23/2002  &  05-23-1998   minimal irregularities in the LAD/  EF 50%  -   DR AL LITTLE   CARDIOVASCULAR STRESS TEST  10/05/1998   MILD GLOBAL HYPOKINESIS AND ISCHEMIAIN ANTEROSEPTAL  AT [facility]/ EF 42%   CARPAL TUNNEL RELEASE Right    EXCISION MELANOMA LEFT UPPER BACK  10/14/2009   eye lid surgery Bilateral 2018   GOLD SEED IMPLANT N/A 07/23/2021   Procedure: GOLD SEED IMPLANT;  Surgeon: Ardis Hughs, MD;  Location: [facility];  Service: Urology;  Laterality: N/A;   KNEE ARTHROSCOPY WITH SUBCHONDROPLASTY Left 04/07/2017   Procedure: LEFT KNEE ARTHROSCOPY WITH PARTIAL MEDIAL MENISCECTOMY AND MEDIAL TIBIAL SUBCHONDROPLASTY;  Surgeon: Mcarthur Rossetti, MD;  Location: [facility];  Service: Orthopedics;  Laterality: Left;   knee injections Bilateral     LUMBAR DISC SURGERY  09/12/2000   left  L5 -- S1   PROSTATE BIOPSY N/A 05/22/2013   Procedure: BIOPSY TRANSRECTAL ULTRASONIC PROSTATE (TUBP);  Surgeon: [name], MD;  Location: [facility];  Service: Urology;  Laterality: N/A;   RIGHT/LEFT HEART CATH AND CORONARY ANGIOGRAPHY N/A 06/25/2020   Procedure: RIGHT/LEFT HEART CATH AND CORONARY ANGIOGRAPHY;  Surgeon: Belva Crome, MD;  Location: [facility];:; Normal coronary arteries; LVEDP 23 mmHg 370.  19 mmHg.   ROTATOR CUFF REPAIR Right 2016   SPACE OAR INSTILLATION N/A 07/23/2021   Procedure: SPACE OAR INSTILLATION;  Surgeon: Ardis Hughs, MD;  Location: [facility];  Service: Urology;  Laterality: N/A;   THROAT SURGERY  04/20/2018   TRANSRECTAL ULTRASOUND PROSTATE BX  05-23-2005  &  04-19-2001   TRANSTHORACIC ECHOCARDIOGRAM  09/2017   EF 45-50 % (previously reported  as 40 to 45%).  Incoordinate septal motion with mild LVH.  GR 1 DD.  Aortic sclerosis but no stenosis.   TRANSTHORACIC ECHOCARDIOGRAM  10/06/2020   Severe septal and lateral wall   Allergies  Allergen Reactions   Nitroglycerin Anaphylaxis    "Cardiologist suggested he shouldn't take this med because it could kill him with his heart condition. Lowers BP"   Crestor [Rosuvastatin] Other (See Comments)    Body aches   Ace Inhibitors Swelling   Lovastatin Nausea Only   Solarcaine [Benzocaine] Dermatitis   Prior to Admission medications   Medication Sig Start Date End Date Taking? Authorizing Provider  aspirin EC 81 MG tablet Take 1 tablet (81 mg total) by mouth daily. 04/26/18  Yes Costella, Vista Mink, PA-C  carvedilol (COREG) 3.125 MG tablet TAKE 1 TABLET (3.125 MG TOTAL) BY MOUTH 2 (TWO) TIMES DAILY WITH A MEAL. 04/28/21  Yes Cleaver, Jossie Ng, NP  colchicine 0.6 MG tablet 2 pills initially with flare, then up to BID for few days if needed. Patient taking differently: Take 0.6 mg by mouth daily as needed (gout flare). 02/26/21  Yes [name], MD  finasteride (PROSCAR) 5 MG tablet Take 5 mg by mouth daily. 05/18/21  Yes [provider]  furosemide (LASIX) 40 MG tablet TAKE 1 TABLET TWICE WEEKLY AS NEEDED AS DIRECTED 07/01/21  Yes Cleaver, Jossie Ng, NP  gabapentin (NEURONTIN) 300 MG capsule Take 1-2 capsules (300-600 mg total) by mouth 3 (three) times daily as needed (pain). 02/26/21  Yes [name], MD  Garlic 1610 MG CAPS Take 1,000 mg by mouth daily.   Yes [provider]  hydrALAZINE (APRESOLINE) 25 MG tablet Take 1 tablet (25 mg total) by mouth in the morning and at bedtime. 07/13/21  Yes Leonie Man, MD  magnesium oxide (MAG-OX) 400 MG tablet Take 400 mg by mouth daily.   Yes [provider]  Multiple Vitamin (MULTIVITAMIN WITH MINERALS) TABS tablet Take 1 tablet by mouth daily. One-A-Day Multivitamin   Yes [provider]  Omega-3 Fatty Acids (FISH OIL) 500 MG CAPS Take 500 mg by mouth daily.   Yes [provider]  sildenafil (VIAGRA) 100 MG tablet 1/2 to 1 tablet up to each day as needed. 07/08/11  Yes [name], MD  tamsulosin (FLOMAX) 0.4 MG CAPS capsule TAKE 1 CAPSULE EVERY DAY 08/03/21  Yes [name], MD  Turmeric 500 MG TABS Take 500 mg by mouth daily.   Yes [provider]  vitamin B-12 (CYANOCOBALAMIN) 500 MCG tablet Take 500 mcg by mouth daily.   Yes [provider]  Zinc 50 MG TABS Take 50 mg by mouth daily.   Yes [provider]  rosuvastatin (CRESTOR) 10 MG tablet Take 1 tablet (10 mg total) by mouth daily. 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  Tobacco Use   Smoking status: Former    Packs/day: 2.00    Years: 20.00    Pack  years: 40.00    Types: Cigarettes    Quit date: 03/28/1980    Years since quitting: 41.4   Smokeless tobacco: Never  Vaping Use  Vaping Use: Never used  Substance and Sexual Activity   Alcohol use: No    Alcohol/week: 0.0 standard drinks   Drug use: No   Sexual activity: Not on file  Other Topics Concern   Not on file  Social History Narrative   He walks on a relatively regular basis about 15 minutes at a time mostly to the discomfort. He previously had walked up a 30-40 minutes a time without problems. Education: [employer].   Lives alone.   Right-handed.   One cup coffee daily.   Social Determinants of Health   Financial Resource Strain: Not on file  Food Insecurity: Not on file  Transportation Needs: Not on file  Physical Activity: Not on file  Stress: Not on file  Social Connections: Not on file  Intimate Partner Violence: Not on file    Review of Systems   Objective:   Vitals:   09/01/21 1257  BP: 112/68  Pulse: 70  Resp: 18  Temp: 98.3 F (36.8 C)  TempSrc: Temporal  SpO2: 98%  Weight: 185 lb (83.9 kg)  Height: '5\' 9"'$  (1.753 m)     Physical Exam Vitals reviewed.  Constitutional:      Appearance: He is well-developed.  HENT:     Head: Normocephalic and atraumatic.  Neck:     Vascular: No carotid bruit or JVD.  Cardiovascular:     Rate and Rhythm: Normal rate and regular rhythm.     Heart sounds: Normal heart sounds. No murmur heard. Pulmonary:     Effort: Pulmonary effort is normal.     Breath sounds: Normal breath sounds. No rales.  Abdominal:     General: Abdomen is flat.     Tenderness: There is no abdominal tenderness.  Musculoskeletal:     Right lower leg: No edema.     Left lower leg: No edema.  Skin:    General: Skin is warm and dry.  Neurological:     Mental Status: He is alert and oriented to person, place, and time.  Psychiatric:        Mood and Affect: Mood normal.       Assessment & Plan:  [name] [name] is a 76  y.o. male . Hypertriglyceridemia - Plan: Comprehensive metabolic panel, Lipid panel  -Tolerating current statin regimen, continue same.  Continue follow-up with cardiology.  Updated labs ordered  Prediabetes  -Improved A1c, commended on diet changes, defer labs for now, repeat in 6 months.  Other polyneuropathy  -Stable with twice daily dosing gabapentin typically, occasional higher dosing with increased activity.  Okay to refill when needed.  No med changes for now  Gout, unspecified cause, unspecified chronicity, unspecified site  -Stable, has colchicine as needed.  No daily meds for now, borderline uric acid previously, RTC precautions  Nonischemic cardiomyopathy ([place])  -Asymptomatic.  Fatigue recently likely due to treatments for prostate cancer.  Continue follow-up with specialist with RTC/ER precautions given  Prostate cancer [facility])  -Currently undergoing radiation treatment, continue follow-up with specialists.  No orders of the defined types were placed in this encounter.  Patient Instructions    No med changes today. If you do have a flare of gout let me know, but can use colchicine if needed. Avoid use of rosuvastatin and colchicine combo.  Hang in there with radiation.  Not unexpected to have fatigue during this time but please let your specialist know if any questions. Take care.  Return to the clinic or go to the nearest emergency  room if any of your symptoms worsen or new symptoms occur.   If you have lab work done today you will be contacted with your lab results within the next 2 weeks.  If you have not heard from [place] then please contact us. The fastest way to get your results is to register for My Chart.   IF you received an x-ray today, you will receive an invoice from [facility]. Please contact [facility] at [phone] with questions or concerns regarding your invoice.   IF you received labwork today, you will receive an invoice from [place].  Please contact [facility] at [phone] with questions or concerns regarding your invoice.   Our billing staff will not be able to assist you with questions regarding bills from these companies.  You will be contacted with the lab results as soon as they are available. The fastest way to get your results is to activate your My Chart account. Instructions are located on the last page of this paperwork. If you have not heard from [place] regarding the results in 2 weeks, please contact this office.        Signed,   [name], MD [place] 09/01/21 1:27 PM

## 2021-09-02 ENCOUNTER — Ambulatory Visit
Admission: RE | Admit: 2021-09-02 | Discharge: 2021-09-02 | Disposition: A | Discharge status: Home / Self Care | Payer: Medicare HMO | Source: Ambulatory Visit | Attending: Radiation Oncology | Admitting: Radiation Oncology

## 2021-09-02 ENCOUNTER — Other Ambulatory Visit: Payer: Self-pay

## 2021-09-02 DIAGNOSIS — Z510 Encounter for antineoplastic radiation therapy: Secondary | ICD-10-CM | POA: Diagnosis not present

## 2021-09-02 DIAGNOSIS — Z191 Hormone sensitive malignancy status: Secondary | ICD-10-CM | POA: Diagnosis not present

## 2021-09-02 DIAGNOSIS — C61 Malignant neoplasm of prostate: Secondary | ICD-10-CM | POA: Diagnosis not present

## 2021-09-02 LAB — RAD ONC ARIA SESSION SUMMARY
Course Elapsed Days: 28
Plan Fractions Treated to Date: 20
Plan Prescribed Dose Per Fraction: 2.5 Gy
Plan Total Fractions Prescribed: 28
Plan Total Prescribed Dose: 70 Gy
Reference Point Dosage Given to Date: 50 Gy
Reference Point Session Dosage Given: 2.5 Gy
Session Number: 20

## 2021-09-03 ENCOUNTER — Ambulatory Visit
Admission: RE | Admit: 2021-09-03 | Discharge: 2021-09-03 | Disposition: A | Discharge status: Home / Self Care | Payer: Medicare HMO | Source: Ambulatory Visit | Attending: Radiation Oncology | Admitting: Radiation Oncology

## 2021-09-03 ENCOUNTER — Other Ambulatory Visit: Payer: Self-pay

## 2021-09-03 DIAGNOSIS — Z191 Hormone sensitive malignancy status: Secondary | ICD-10-CM | POA: Diagnosis not present

## 2021-09-03 DIAGNOSIS — Z510 Encounter for antineoplastic radiation therapy: Secondary | ICD-10-CM | POA: Diagnosis not present

## 2021-09-03 DIAGNOSIS — C61 Malignant neoplasm of prostate: Secondary | ICD-10-CM | POA: Diagnosis not present

## 2021-09-03 LAB — RAD ONC ARIA SESSION SUMMARY
Course Elapsed Days: 29
Plan Fractions Treated to Date: 21
Plan Prescribed Dose Per Fraction: 2.5 Gy
Plan Total Fractions Prescribed: 28
Plan Total Prescribed Dose: 70 Gy
Reference Point Dosage Given to Date: 52.5 Gy
Reference Point Session Dosage Given: 2.5 Gy
Session Number: 21

## 2021-09-06 ENCOUNTER — Ambulatory Visit
Admission: RE | Admit: 2021-09-06 | Discharge: 2021-09-06 | Disposition: A | Discharge status: Home / Self Care | Payer: Medicare HMO | Source: Ambulatory Visit | Attending: Radiation Oncology | Admitting: Radiation Oncology

## 2021-09-06 ENCOUNTER — Other Ambulatory Visit: Payer: Self-pay

## 2021-09-06 DIAGNOSIS — C61 Malignant neoplasm of prostate: Secondary | ICD-10-CM | POA: Diagnosis not present

## 2021-09-06 DIAGNOSIS — Z191 Hormone sensitive malignancy status: Secondary | ICD-10-CM | POA: Diagnosis not present

## 2021-09-06 DIAGNOSIS — Z510 Encounter for antineoplastic radiation therapy: Secondary | ICD-10-CM | POA: Diagnosis not present

## 2021-09-06 LAB — RAD ONC ARIA SESSION SUMMARY
Course Elapsed Days: 32
Plan Fractions Treated to Date: 22
Plan Prescribed Dose Per Fraction: 2.5 Gy
Plan Total Fractions Prescribed: 28
Plan Total Prescribed Dose: 70 Gy
Reference Point Dosage Given to Date: 55 Gy
Reference Point Session Dosage Given: 2.5 Gy
Session Number: 22

## 2021-09-07 ENCOUNTER — Other Ambulatory Visit: Payer: Self-pay

## 2021-09-07 ENCOUNTER — Ambulatory Visit
Admission: RE | Admit: 2021-09-07 | Discharge: 2021-09-07 | Disposition: A | Discharge status: Home / Self Care | Payer: Medicare HMO | Source: Ambulatory Visit | Attending: Radiation Oncology | Admitting: Radiation Oncology

## 2021-09-07 DIAGNOSIS — C61 Malignant neoplasm of prostate: Secondary | ICD-10-CM | POA: Diagnosis not present

## 2021-09-07 DIAGNOSIS — Z510 Encounter for antineoplastic radiation therapy: Secondary | ICD-10-CM | POA: Diagnosis not present

## 2021-09-07 DIAGNOSIS — Z191 Hormone sensitive malignancy status: Secondary | ICD-10-CM | POA: Diagnosis not present

## 2021-09-07 LAB — RAD ONC ARIA SESSION SUMMARY
Course Elapsed Days: 33
Plan Fractions Treated to Date: 23
Plan Prescribed Dose Per Fraction: 2.5 Gy
Plan Total Fractions Prescribed: 28
Plan Total Prescribed Dose: 70 Gy
Reference Point Dosage Given to Date: 57.5 Gy
Reference Point Session Dosage Given: 2.5 Gy
Session Number: 23

## 2021-09-08 ENCOUNTER — Ambulatory Visit
Admission: RE | Admit: 2021-09-08 | Discharge: 2021-09-08 | Disposition: A | Discharge status: Home / Self Care | Payer: Medicare HMO | Source: Ambulatory Visit | Attending: Radiation Oncology | Admitting: Radiation Oncology

## 2021-09-08 ENCOUNTER — Other Ambulatory Visit: Payer: Self-pay

## 2021-09-08 DIAGNOSIS — C61 Malignant neoplasm of prostate: Secondary | ICD-10-CM | POA: Diagnosis not present

## 2021-09-08 DIAGNOSIS — Z191 Hormone sensitive malignancy status: Secondary | ICD-10-CM | POA: Diagnosis not present

## 2021-09-08 DIAGNOSIS — Z510 Encounter for antineoplastic radiation therapy: Secondary | ICD-10-CM | POA: Diagnosis not present

## 2021-09-08 LAB — RAD ONC ARIA SESSION SUMMARY
Course Elapsed Days: 34
Plan Fractions Treated to Date: 24
Plan Prescribed Dose Per Fraction: 2.5 Gy
Plan Total Fractions Prescribed: 28
Plan Total Prescribed Dose: 70 Gy
Reference Point Dosage Given to Date: 60 Gy
Reference Point Session Dosage Given: 2.5 Gy
Session Number: 24

## 2021-09-09 ENCOUNTER — Ambulatory Visit
Admission: RE | Admit: 2021-09-09 | Discharge: 2021-09-09 | Disposition: A | Discharge status: Home / Self Care | Payer: Medicare HMO | Source: Ambulatory Visit | Attending: Radiation Oncology | Admitting: Radiation Oncology

## 2021-09-09 ENCOUNTER — Other Ambulatory Visit: Payer: Self-pay

## 2021-09-09 DIAGNOSIS — Z191 Hormone sensitive malignancy status: Secondary | ICD-10-CM | POA: Diagnosis not present

## 2021-09-09 DIAGNOSIS — C61 Malignant neoplasm of prostate: Secondary | ICD-10-CM | POA: Diagnosis not present

## 2021-09-09 DIAGNOSIS — Z510 Encounter for antineoplastic radiation therapy: Secondary | ICD-10-CM | POA: Diagnosis not present

## 2021-09-09 LAB — RAD ONC ARIA SESSION SUMMARY
Course Elapsed Days: 35
Plan Fractions Treated to Date: 25
Plan Prescribed Dose Per Fraction: 2.5 Gy
Plan Total Fractions Prescribed: 28
Plan Total Prescribed Dose: 70 Gy
Reference Point Dosage Given to Date: 62.5 Gy
Reference Point Session Dosage Given: 2.5 Gy
Session Number: 25

## 2021-09-10 ENCOUNTER — Ambulatory Visit
Admission: RE | Admit: 2021-09-10 | Discharge: 2021-09-10 | Disposition: A | Discharge status: Home / Self Care | Payer: Medicare HMO | Source: Ambulatory Visit | Attending: Radiation Oncology | Admitting: Radiation Oncology

## 2021-09-10 ENCOUNTER — Other Ambulatory Visit: Payer: Self-pay

## 2021-09-10 DIAGNOSIS — Z191 Hormone sensitive malignancy status: Secondary | ICD-10-CM | POA: Diagnosis not present

## 2021-09-10 DIAGNOSIS — Z510 Encounter for antineoplastic radiation therapy: Secondary | ICD-10-CM | POA: Diagnosis not present

## 2021-09-10 DIAGNOSIS — C61 Malignant neoplasm of prostate: Secondary | ICD-10-CM | POA: Diagnosis not present

## 2021-09-10 LAB — RAD ONC ARIA SESSION SUMMARY
Course Elapsed Days: 36
Plan Fractions Treated to Date: 26
Plan Prescribed Dose Per Fraction: 2.5 Gy
Plan Total Fractions Prescribed: 28
Plan Total Prescribed Dose: 70 Gy
Reference Point Dosage Given to Date: 65 Gy
Reference Point Session Dosage Given: 2.5 Gy
Session Number: 26

## 2021-09-13 ENCOUNTER — Other Ambulatory Visit: Payer: Self-pay

## 2021-09-13 ENCOUNTER — Ambulatory Visit
Admission: RE | Admit: 2021-09-13 | Discharge: 2021-09-13 | Disposition: A | Discharge status: Home / Self Care | Payer: Medicare HMO | Source: Ambulatory Visit | Attending: Radiation Oncology | Admitting: Radiation Oncology

## 2021-09-13 DIAGNOSIS — Z191 Hormone sensitive malignancy status: Secondary | ICD-10-CM | POA: Diagnosis not present

## 2021-09-13 DIAGNOSIS — C61 Malignant neoplasm of prostate: Secondary | ICD-10-CM | POA: Diagnosis not present

## 2021-09-13 DIAGNOSIS — Z510 Encounter for antineoplastic radiation therapy: Secondary | ICD-10-CM | POA: Diagnosis not present

## 2021-09-13 LAB — RAD ONC ARIA SESSION SUMMARY
Course Elapsed Days: 39
Plan Fractions Treated to Date: 27
Plan Prescribed Dose Per Fraction: 2.5 Gy
Plan Total Fractions Prescribed: 28
Plan Total Prescribed Dose: 70 Gy
Reference Point Dosage Given to Date: 67.5 Gy
Reference Point Session Dosage Given: 2.5 Gy
Session Number: 27

## 2021-09-14 ENCOUNTER — Encounter: Payer: Self-pay | Admitting: Radiation Oncology

## 2021-09-14 ENCOUNTER — Other Ambulatory Visit: Payer: Self-pay

## 2021-09-14 ENCOUNTER — Ambulatory Visit
Admission: RE | Admit: 2021-09-14 | Discharge: 2021-09-14 | Disposition: A | Discharge status: Home / Self Care | Payer: Medicare HMO | Source: Ambulatory Visit | Attending: Radiation Oncology | Admitting: Radiation Oncology

## 2021-09-14 DIAGNOSIS — C61 Malignant neoplasm of prostate: Secondary | ICD-10-CM | POA: Diagnosis not present

## 2021-09-14 DIAGNOSIS — Z510 Encounter for antineoplastic radiation therapy: Secondary | ICD-10-CM | POA: Diagnosis not present

## 2021-09-14 DIAGNOSIS — Z191 Hormone sensitive malignancy status: Secondary | ICD-10-CM | POA: Diagnosis not present

## 2021-09-14 LAB — RAD ONC ARIA SESSION SUMMARY
Course Elapsed Days: 40
Plan Fractions Treated to Date: 28
Plan Prescribed Dose Per Fraction: 2.5 Gy
Plan Total Fractions Prescribed: 28
Plan Total Prescribed Dose: 70 Gy
Reference Point Dosage Given to Date: 70 Gy
Reference Point Session Dosage Given: 2.5 Gy
Session Number: 28

## 2021-09-15 ENCOUNTER — Ambulatory Visit (INDEPENDENT_AMBULATORY_CARE_PROVIDER_SITE_OTHER): Payer: Medicare HMO | Admitting: Cardiology

## 2021-09-15 ENCOUNTER — Encounter: Payer: Self-pay | Admitting: Cardiology

## 2021-09-15 VITALS — BP 118/82 | HR 75 | Ht 68.0 in | Wt 183.4 lb

## 2021-09-15 DIAGNOSIS — I5022 Chronic systolic (congestive) heart failure: Secondary | ICD-10-CM | POA: Diagnosis not present

## 2021-09-15 DIAGNOSIS — E785 Hyperlipidemia, unspecified: Secondary | ICD-10-CM

## 2021-09-15 DIAGNOSIS — I428 Other cardiomyopathies: Secondary | ICD-10-CM

## 2021-09-15 DIAGNOSIS — R0602 Shortness of breath: Secondary | ICD-10-CM | POA: Diagnosis not present

## 2021-09-15 DIAGNOSIS — I447 Left bundle-branch block, unspecified: Secondary | ICD-10-CM

## 2021-09-15 NOTE — Patient Instructions (Signed)
Medication Instructions:   NO CHANGES  *If you need a refill on your cardiac medications before your next appointment, please call your pharmacy*   Lab Work: NOT NEEDED If you have labs (blood work) drawn today and your tests are completely normal, you will receive your results only by: [facility] (if you have MyChart) OR A paper copy in the mail If you have any lab test that is abnormal or we need to change your treatment, we will call you to review the results.   Testing/Procedures: NOT NEEDED   Follow-Up: At [facility], you and your health needs are our priority.  As part of our continuing mission to provide you with exceptional heart care, we have created designated Provider Care Teams.  These Care Teams include your primary Cardiologist (physician) and Advanced Practice Providers (APPs -  Physician Assistants and Nurse Practitioners) who all work together to provide you with the care you need, when you need it.     Your next appointment:   6 month(s)  The format for your next appointment:   In Person  Provider:   Glenetta Hew, MD   You have been referred to  Dr Lovena Le TO DISCUSS Biv-ICD  Other Instructions

## 2021-09-15 NOTE — Progress Notes (Signed)
Primary Care Provider: [name], MD Cardiologist: Glenetta Hew, MD Electrophysiologist: None  Clinic Note: Chief Complaint  Patient presents with   Follow-up    More fatigued.  Getting worn out.  Tired   Cardiomyopathy    Not really short of breath, just fatigued   ===================================  ASSESSMENT/PLAN   Problem List Items Addressed This Visit       Cardiology Problems   Chronic HFrEF (heart failure with reduced ejection fraction) (HCC) (Chronic)   Relevant Orders   Ambulatory referral to Cardiac Electrophysiology   Hyperlipidemia with target LDL less than 100 (Chronic)    On rosuvastatin.  Labs followed by PCP.  With relatively normal coronaries, no need to be overly aggressive.      LBBB (left bundle branch block) (Chronic)    Nonischemic electrical abnormality leading to significant drop in EF.  I do think he would benefit of BiV ICD/CRT-D or even CRT-P depending on what his discussion with Dr. Lovena Le leads to.  We will refer back to Dr. 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  He initially had deferred BiV ICD, but now at this point I think we need to move forward.  He is extremely fatigued and getting worn out.  I worry that if we do not do something at this point, he may never benefit in the future.  I am leery of pushing his meds any further than low-dose carvedilol and hydralazine.  He is on a standing dose of Lasix which she is only taking it twice a week and not using any more doses than that.  I think he probably could use it additionally 1 to 2 days extra week if necessary. With his blood pressure and prostate issues with frequent urination, I am leery of adding spironolactone.  No ARB/Arni because of issues with previously documented  ACE inhibitor allergy.  Since he maxed out on therapy with no further options besides CRT-D/P.  Refer back to Dr. Lovena Le to consider therapy.  He is now agreeable to proceed.      Relevant Orders   Ambulatory referral to Cardiac Electrophysiology     Other   Exertional shortness of breath (Chronic)    I think exertional dyspnea is related to his low EF.  He is not really having volume issues, I think is simply just low output.  Not a lot of room to increase medical management further.  Referral back to EP for consideration of CRT-P/D       ===================================  HPI:    [name] [name]) is a 76 y.o. male with a PMH notable for NICM (EF ~45%-reduced to 20% on March 2022), longstanding LBBB, Mild Aortic Stenosis who presents today for 40-monthfollow-up. at the request of GWendie Agreste MD.  [name] Mollenkopfwas last seen on April 30, 2021.  He was relatively stable, but was recovering from [place] he developed early in December 2022.  He had a few days of feeling quite poorly, and then as he was recovering he developed a popliteal cyst in his left knee.  Still had ongoing pain.  Limited mobility.  Energy level is gone down from combination of these 2 hits.  No real worsening dyspnea just fatigue from deconditioning.  Notable dyspnea going uphill but  also complicated by pain.  Still able to do daily chores around the house but does not walking routinely.  Works in his garage.  Had an episode of feeling diaphoretic cool and clammy with blood pressure is way low in the 60s/40s-the feeling was that he had maybe taken extra dose of his blood pressure medication.  This resolved and he had no further episodes.  He had lost some weight. => Still undergoing therapy for prostate cancer with XRT. Initiate hydralazine 25 mg twice daily with plans to titrate hydralazine further and possibly add spironolactone.  (Concern about ARB or ARNI because of ACE inhibitor  allergy.) => No nitrate because of sildenafil use. Continued furosemide twice a week and as needed. Discussed potential evaluation by EP to consider BiV ICD upgrade.  In the fall, he had felt that he is was feeling better and stronger.  At that time was not interested in pursuing treatment.  At this point he is now realizing that he is not getting any better, and is willing to consider treatment.  Recent Hospitalizations: None  Reviewed  CV studies:    The following studies were reviewed today: (if available, images/films reviewed: From Epic Chart or Care Everywhere) Echo 10/06/2020: Severely reduced EF of 20 to 25%.  No LV thrombus.  Severe septal-lateral wall systolic dyssynchrony due to LBBB.  Global HK.  Moderately dilated LV.  GR 1 DD with mild LA dilation..  Unable to assess PAP with normal RV size and function.  Normal RAP.  Mild AOV sclerosis but no stenosis.  Mild to moderate MR. Comparison(s): The left ventricular function is unchanged. The left ventricular diastolic function has improved. 06/16/20 EF <20%.  Interval History:   [name] [name] returns here today for follow-up.  He is finishing up his treatments for his prostate cancer, doing XRT.  He just frequently feels worn out and tired.  He has lots of nocturia.  It is hard to tell whether his fatigue is due to his cancer treatments or his CHF.  He still tries to get out and do some walking and things but he does not able to do what he used to do.  Tires out too easily.  He has some mild ankle and foot swelling, no real PND orthopnea.  Just pretty significant exertional fatigue and dyspnea.  He feels pretty worn out.  NYHA class probably 2 if not 3 symptoms just from the fatigue and dyspnea.  Not really having significant edema issues.  More low output symptoms. He actually has not had any further lightheaded dizzy spells with the low blood pressures.  Seems to be tolerating the hydralazine.  He has not used any extra furosemide, but  probably should.  Not really checking his daily weights.  No chest pain or pressure with rest or exertion.  No syncope or near syncope although he does get some dizziness if he tries to overdo it-more related to fatigue.  I think this is probably actually more associated when he is having his cancer treatments..  CV Review of Symptoms (Summary) Cardiovascular ROS: positive for - dyspnea on exertion and -Dizziness, fatigue negative for - chest pain, edema, irregular heartbeat, orthopnea, paroxysmal nocturnal dyspnea, rapid heart rate, shortness of breath, or Syncope/near syncope or TIA/amaurosis fugax, claudication  REVIEWED OF SYSTEMS   Review of Systems  Constitutional:  Positive for malaise/fatigue. Negative for weight loss.  HENT:  Negative for congestion and nosebleeds.   Respiratory:  Positive for shortness of breath (Exertional). Negative for  cough.   Cardiovascular:        Per HPI  Gastrointestinal:  Negative for abdominal pain, blood in stool and melena.  Genitourinary:  Negative for dysuria, frequency and hematuria.       Nocturia  Musculoskeletal:  Positive for joint pain. Negative for falls.  Neurological:  Positive for dizziness and weakness (Generalized weakness and fatigue). Negative for focal weakness.  Psychiatric/Behavioral:  Negative for depression (I cannot tell if he seems depressed, but does have some concerning symptoms of anhedonia and change in sleep habits.) and memory loss. The patient is nervous/anxious. The patient does not have insomnia.     I have reviewed and (if needed) personally updated the patient's problem list, medications, allergies, past medical and surgical history, social and family history.   PAST MEDICAL HISTORY   Past Medical History:  Diagnosis Date   Arthritis    BPH (benign prostatic hypertrophy)    CHF (congestive heart failure) ([place])    Elevated PSA    Eye problem 03/06/2016   [name] retinal detachment left eye, no sx done    History of gout    pt 05-20-2013 states stable    History of melanoma excision    2011-- LEFT UPPER BACK   Hyperlipemia    Hypertriglyceridemia    Left bundle branch block 08/2017   Concern for LBBB mediated cardiomyopathy   Melanoma ([place]) 2011   back   Nonischemic cardiomyopathy ([place]) 09/2020   08/2009: EF 40-45%, Global HK-> 09/2017 -> EF 45-50% ; 3/'22: EF<20%.  Elevated LVEDP.;  09/2020: EF 20-25%.  GR 1 DD   Numbness    hands   Personal history of arthritis    Personal history of other diseases of circulatory system    Pre-diabetes     PAST SURGICAL HISTORY   Past Surgical History:  Procedure Laterality Date   ANTERIOR CERVICAL DECOMP/DISCECTOMY FUSION N/A 04/20/2018   Procedure: Anterior Cervical Decompression Fusion - Cervical Three-Cervical Four - Cervical Four-Cervical Five - Cervical Five-Cervical Six;  Surgeon: Eustace Moore, MD;  Location: [facility];  Service: Neurosurgery;  Laterality: N/A;  Anterior Cervical Decompression Fusion - Cervical Three-Cervical Four - Cervical Four-Cervical Five - Cervical Five-Cervical Six   APPENDECTOMY  AS CHILD   BACK SURGERY     CARDIOVASCULAR STRESS TEST  10/05/1998   MILD GLOBAL HYPOKINESIS AND ISCHEMIAIN ANTEROSEPTAL  AT [facility]/ EF 42%   CARPAL TUNNEL RELEASE Right    EXCISION MELANOMA LEFT UPPER BACK  10/14/2009   eye lid surgery Bilateral 2018   GOLD SEED IMPLANT N/A 07/23/2021   Procedure: GOLD SEED IMPLANT;  Surgeon: Ardis Hughs, MD;  Location: [facility];  Service: Urology;  Laterality: N/A;   KNEE ARTHROSCOPY WITH SUBCHONDROPLASTY Left 04/07/2017   Procedure: LEFT KNEE ARTHROSCOPY WITH PARTIAL MEDIAL MENISCECTOMY AND MEDIAL TIBIAL SUBCHONDROPLASTY;  Surgeon: Mcarthur Rossetti, MD;  Location: [facility];  Service: Orthopedics;  Laterality: Left;   knee injections Bilateral    LEFT HEART CATH AND CORONARY ANGIOGRAPHY  04/2002   minimal irregularities in the LAD/  EF 50%  -   DR AL LITTLE   LUMBAR DISC SURGERY  09/12/2000    left  L5 -- S1   PROSTATE BIOPSY N/A 05/22/2013   Procedure: BIOPSY TRANSRECTAL ULTRASONIC PROSTATE (TUBP);  Surgeon: [name], MD;  Location[facility];  Service: Urology;  Laterality: N/A;   RIGHT/LEFT HEART CATH AND CORONARY ANGIOGRAPHY N/A 06/25/2020   Procedure: RIGHT/LEFT HEART CATH AND CORONARY ANGIOGRAPHY;  Surgeon: Belva Crome, MD;  Location: [facility];:; Normal coronary arteries; LVEDP 23 mmHg; PCWP 19 mmHg.   ROTATOR CUFF REPAIR Right 2016   SPACE OAR INSTILLATION N/A 07/23/2021   Procedure: SPACE OAR INSTILLATION;  Surgeon: Ardis Hughs, MD;  Location: [facility];  Service: Urology;  Laterality: N/A;   THROAT SURGERY  04/20/2018   TRANSRECTAL ULTRASOUND PROSTATE BX  05-23-2005  &  04-19-2001   TRANSTHORACIC ECHOCARDIOGRAM  09/2017   EF 45-50 % (previously reported as 40 to 45%).  Incoordinate septal motion with mild LVH.  GR 1 DD.  Aortic sclerosis but no stenosis.   TRANSTHORACIC ECHOCARDIOGRAM  10/06/2020   Severely reduced EF of 20 to 25%.  No LV thrombus.  Severe septal-lateral wall systolic dyssynchrony due to LBBB.  Global HK.  Moderately dilated LV.  GR 1 DD with mild LA dilation..  Unable to assess PAP with normal RV size and function.  Normal RAP.  Mild AOV sclerosis but no stenosis.  Mild to moderate MR.   TRANSTHORACIC ECHOCARDIOGRAM  06/16/2020   Severely reduced EF <20%.  Moderate severely dilated LV.  GR 2 DD.  Elevated LVEDP.  Mild LA dilation.  Mildly reduced RV function.  Mild MR.  Mild aortic valve calcification   Left lower extremity venous duplex 03/16/2021: No DVT.  18 x 2.5 x 4.6 cm complex fluid collection in the left popliteal fossa extending the calf cyst versus hematoma.  Immunization History  Administered Date(s) Administered   Hepatitis B 01/14/2011   Influenza Split 01/14/2011, 01/18/2012   Influenza, High Dose Seasonal PF 01/10/2018, 12/19/2018   Influenza,inj,Quad PF,6+ Mos 02/04/2013, 12/26/2013, 01/23/2015,  01/22/2016, 12/16/2016   Influenza-Unspecified 12/27/2019   PFIZER(Purple Top)SARS-COV-2 Vaccination 04/30/2019, 05/22/2019, 12/31/2019, 07/22/2020   Pneumococcal Conjugate-13 05/06/2014   Pneumococcal Polysaccharide-23 03/28/2004, 04/29/2009   Tdap 08/11/2009   Zoster, Live 06/11/2013    MEDICATIONS/ALLERGIES   Current Meds  Medication Sig   aspirin EC 81 MG tablet Take 1 tablet (81 mg total) by mouth daily.   carvedilol (COREG) 3.125 MG tablet TAKE 1 TABLET (3.125 MG TOTAL) BY MOUTH 2 (TWO) TIMES DAILY WITH A MEAL.   colchicine 0.6 MG tablet 2 pills initially with flare, then up to BID for few days if needed. (Patient taking differently: Take 0.6 mg by mouth as needed (gout flare).)   finasteride (PROSCAR) 5 MG tablet Take 5 mg by mouth daily.   furosemide (LASIX) 40 MG tablet TAKE 1 TABLET TWICE WEEKLY AS NEEDED AS DIRECTED   gabapentin (NEURONTIN) 300 MG capsule Take 1-2 capsules (300-600 mg total) by mouth 3 (three) times daily as needed (pain).   hydrALAZINE (APRESOLINE) 25 MG tablet Take 1 tablet (25 mg total) by mouth in the morning and at bedtime.   magnesium oxide (MAG-OX) 400 MG tablet Take 400 mg by mouth daily.   Multiple Vitamin (MULTIVITAMIN WITH MINERALS) TABS tablet Take 1 tablet by mouth daily. One-A-Day Multivitamin   Omega-3 Fatty Acids (FISH OIL) 500 MG CAPS Take 500 mg by mouth daily.   sildenafil (VIAGRA) 100 MG tablet 1/2 to 1 tablet up to each day as needed.   tamsulosin (FLOMAX) 0.4 MG CAPS capsule TAKE 1 CAPSULE EVERY DAY   Turmeric 500 MG TABS Take 500 mg by mouth daily.   vitamin B-12 (CYANOCOBALAMIN) 500 MCG tablet Take 500 mcg by mouth daily.   Zinc 50 MG TABS Take 50 mg by mouth daily.   [DISCONTINUED] Garlic 4627 MG CAPS Take 1,000 mg by mouth  daily.   Current Facility-Administered Medications for the 09/15/21 encounter (Office Visit) with Leonie Man, MD  Medication   sodium chloride flush (NS) 0.9 % injection 3 mL    Allergies  Allergen  Reactions   Nitroglycerin Anaphylaxis    "Cardiologist suggested he shouldn't take this med because it could kill him with his heart condition. Lowers BP"   Crestor [Rosuvastatin] Other (See Comments)    Body aches   Ace Inhibitors Swelling   Lovastatin Nausea Only   Solarcaine [Benzocaine] Dermatitis    SOCIAL HISTORY/FAMILY HISTORY   Reviewed in Epic:  Pertinent findings:  Social History   Tobacco Use   Smoking status: Former    Packs/day: 2.00    Years: 20.00    Total pack years: 40.00    Types: Cigarettes    Quit date: 03/28/1980    Years since quitting: 41.5   Smokeless tobacco: Never  Vaping Use   Vaping Use: Never used  Substance Use Topics   Alcohol use: No    Alcohol/week: 0.0 standard drinks of alcohol   Drug use: No   Social History   Social History Narrative   He walks on a relatively regular basis about 15 minutes at a time mostly to the discomfort. He previously had walked up a 30-40 minutes a time without problems. Education: [employer].   Lives alone.   Right-handed.   One cup coffee daily.    OBJCTIVE -PE, EKG, labs   Wt Readings from Last 3 Encounters:  10/01/21 185 lb (83.9 kg)  09/22/21 184 lb (83.5 kg)  09/15/21 183 lb 6.4 oz (83.2 kg)    Physical Exam: BP 118/82   Pulse 75   Ht '5\' 8"'$  (1.727 m)   Wt 183 lb 6.4 oz (83.2 kg)   SpO2 97%   BMI 27.89 kg/m  Physical Exam Vitals reviewed.  Constitutional:      General: He is not in acute distress.    Appearance: Normal appearance. He is normal weight. He is ill-appearing (Chronically ill-appearing.  Just seems worn out). He is not toxic-appearing.  HENT:     Head: Normocephalic and atraumatic.  Neck:     Vascular: No carotid bruit, hepatojugular reflux or JVD.  Cardiovascular:     Rate and Rhythm: Normal rate and regular rhythm. Occasional Extrasystoles are present.    Chest Wall: PMI is not displaced.     Pulses: Normal pulses.     Heart sounds: Heart sounds are distant. No murmur  heard.    No friction rub. Gallop present. S4 sounds present.     Comments: Normal S1, split S2 Pulmonary:     Effort: Pulmonary effort is normal. No respiratory distress.     Breath sounds: Normal breath sounds. No wheezing, rhonchi or rales.  Chest:     Chest wall: No tenderness.  Musculoskeletal:        General: Swelling (Trivial bilateral LE) present. Normal range of motion.     Cervical back: Normal range of motion and neck supple.  Skin:    General: Skin is warm and dry.     Coloration: Skin is not pale.  Neurological:     General: No focal deficit present.     Mental Status: He is alert and oriented to person, place, and time.     Motor: Weakness (Generalized) present.     Gait: Gait abnormal.  Psychiatric:        Behavior: Behavior normal.  Thought Content: Thought content normal.        Judgment: Judgment normal.     Comments: Mood does seem somewhat down.      Adult ECG Report Not checked  Recent Labs: Reviewed Lab Results  Component Value Date   CHOL 115 09/01/2021   HDL 35.90 (L) 09/01/2021   LDLCALC 44 09/01/2021   TRIG 178.0 (H) 09/01/2021   CHOLHDL 3 09/01/2021   Lab Results  Component Value Date   CREATININE 1.04 10/01/2021   BUN 23 10/01/2021   NA 139 10/01/2021   K 4.8 10/01/2021   CL 104 10/01/2021   CO2 20 10/01/2021      Latest Ref Rng & Units 10/01/2021    9:49 AM 07/12/2021   11:17 AM 03/06/2021    6:10 PM  CBC  WBC 3.4 - 10.8 x10E3/uL 5.5  8.4  10.6   Hemoglobin 13.0 - 17.7 g/dL 12.5  13.5  12.5   Hematocrit 37.5 - 51.0 % 37.6  41.4  37.6   Platelets 150 - 450 x10E3/uL 146  155  147     Lab Results  Component Value Date   HGBA1C 6.2 (H) 07/12/2021   Lab Results  Component Value Date   TSH 1.280 08/08/2017    ==================================================  COVID-19 Education: The signs and symptoms of COVID-19 were discussed with the patient and how to seek care for testing (follow up with PCP or arrange E-visit).     I spent a total of 38 minutes with the patient spent in direct patient consultation.  Additional time spent with chart review  / charting (studies, outside notes, etc): 18 min Total Time: 56 min  Current medicines are reviewed at length with the patient today.  (+/- concerns) none  This visit occurred during the SARS-CoV-2 public health emergency.  Safety protocols were in place, including screening questions prior to the visit, additional usage of staff PPE, and extensive cleaning of exam room while observing appropriate contact time as indicated for disinfecting solutions.  Notice: This dictation was prepared with Dragon dictation along with smart phrase technology. Any transcriptional errors that result from this process are unintentional and may not be corrected upon review.  Studies Ordered:   Orders Placed This Encounter  Procedures   Ambulatory referral to Cardiac Electrophysiology   No orders of the defined types were placed in this encounter.   Patient Instructions / Medication Changes & Studies & Tests Ordered   Patient Instructions  Medication Instructions:   NO CHANGES  *If you need a refill on your cardiac medications before your next appointment, please call your pharmacy*   Lab Work: NOT NEEDED If you have labs (blood work) drawn today and your tests are completely normal, you will receive your results only by: MyChart Message (if you have MyChart) OR A paper copy in the mail If you have any lab test that is abnormal or we need to change your treatment, we will call you to review the results.   Testing/Procedures: NOT NEEDED   Follow-Up: At [facility], you and your health needs are our priority.  As part of our continuing mission to provide you with exceptional heart care, we have created designated Provider Care Teams.  These Care Teams include your primary Cardiologist (physician) and Advanced Practice Providers (APPs -  Physician Assistants and  Nurse Practitioners) who all work together to provide you with the care you need, when you need it.     Your next appointment:   6  month(s)  The format for your next appointment:   In Person  Provider:   Glenetta Hew, MD   You have been referred to  Dr Lovena Le TO DISCUSS Biv-ICD  Other Instructions      Glenetta Hew, M.D., M.S. Interventional Cardiologist   Pager # [phone] Phone # [phone] [address] [place],  [address]   Thank you for choosing Heartcare at [facility]!!

## 2021-09-16 ENCOUNTER — Telehealth: Payer: Self-pay | Admitting: Cardiology

## 2021-09-16 NOTE — Telephone Encounter (Signed)
Pt was seen yes

## 2021-09-16 NOTE — Telephone Encounter (Signed)
Pt was seen by Dr.Harding on 09/15/2021, [facility] Requested for the pt to be seen by Dr.Taylor, Sent Gracy Bruins a teams message, Secure Chat Message and also called and LVM to inform her for appt to be made with Dr.Taylor. No answer. 09/16/2021 BM

## 2021-09-22 ENCOUNTER — Encounter: Payer: Self-pay | Admitting: Gastroenterology

## 2021-09-22 ENCOUNTER — Ambulatory Visit: Payer: Medicare HMO | Admitting: Gastroenterology

## 2021-09-22 VITALS — BP 106/58 | HR 88 | Ht 67.5 in | Wt 184.0 lb

## 2021-09-22 DIAGNOSIS — Z86010 Personal history of colonic polyps: Secondary | ICD-10-CM

## 2021-09-22 NOTE — Progress Notes (Signed)
Review of pertinent gastrointestinal problems: 1. Tubular adenomas, small. Removed 04/2007 colonoscopy. Colonoscopy 2014 3 subcentimeter adenomas removed. Colonoscopy 04/2015 a single subcentimeter tubular adenoma removed. 2. Left sided tics, internal hems also noted on above 2009 colonoscopy.    HPI: This is a very pleasant 76 year old man who was referred to me by [name], MD  to evaluate history of colon polyps.    I last saw him about 6 years ago at the time of a colonoscopy.  See the results summarized above.  Echocardiogram July 2022 showed left ventricular ejection fraction 20 to 25%.  He has nonischemic cardiomyopathy.  Blood work for 2023 showed normal CBC, blood work June 2023 showed normal complete metabolic profile.  He has recently been getting radiation for prostate cancer.  His last radiation was last week.  He believes he is done with radiation now.  He did experience significant constipation and urinary retention shortly after starting his prostate radiation.  This eased up a bit however he is still taking a Colace orally once daily.  He has not seen any blood in his stool.  His weight is overall stable.  He is quite active  Review of systems: Pertinent positive and negative review of systems were noted in the above HPI section. All other review negative.   Past Medical History:  Diagnosis Date   Arthritis    BPH (benign prostatic hypertrophy)    CHF (congestive heart failure) ([place])    Coronary atherosclerosis of native coronary vessel    CATH 2004--  MINIMAL NONOBSTRUCTIVE LAD/  NORMAL EF/    CARDIAC CT  03/2008  NO SIGNIFICANT DISEASE  AND  nlef   Elevated PSA    Eye problem 03/06/2016   [name] retinal detachment left eye, no sx done   History of gout    pt 05-20-2013 states stable    History of melanoma excision    2011-- LEFT UPPER BACK   Hyperlipemia    Hypertriglyceridemia    Left bundle branch block    08/2017   Melanoma ([place]) 2011   back    Nonischemic cardiomyopathy ([place])    09-04-2009  --  2D echo-EF 40-45%, Global Hypokinesis - 09/2017 -> EF 45-50%    Numbness    hands   Personal history of arthritis    Personal history of other diseases of circulatory system    Pre-diabetes     Past Surgical History:  Procedure Laterality Date   ANTERIOR CERVICAL DECOMP/DISCECTOMY FUSION N/A 04/20/2018   Procedure: Anterior Cervical Decompression Fusion - Cervical Three-Cervical Four - Cervical Four-Cervical Five - Cervical Five-Cervical Six;  Surgeon: Eustace Moore, MD;  Location: [place];  Service: Neurosurgery;  Laterality: N/A;  Anterior Cervical Decompression Fusion - Cervical Three-Cervical Four - Cervical Four-Cervical Five - Cervical Five-Cervical Six   APPENDECTOMY  AS CHILD   BACK SURGERY     CARDIAC CATHETERIZATION  05/23/2002  &  05-23-1998   minimal irregularities in the LAD/  EF 50%  -   DR AL LITTLE   CARDIOVASCULAR STRESS TEST  10/05/1998   MILD GLOBAL HYPOKINESIS AND ISCHEMIAIN ANTEROSEPTAL  AT [facility]/ EF 42%   CARPAL TUNNEL RELEASE Right    EXCISION MELANOMA LEFT UPPER BACK  10/14/2009   eye lid surgery Bilateral 2018   GOLD SEED IMPLANT N/A 07/23/2021   Procedure: GOLD SEED IMPLANT;  Surgeon: Ardis Hughs, MD;  Location: [facility];  Service: Urology;  Laterality: N/A;   KNEE ARTHROSCOPY WITH SUBCHONDROPLASTY Left 04/07/2017  Procedure: LEFT KNEE ARTHROSCOPY WITH PARTIAL MEDIAL MENISCECTOMY AND MEDIAL TIBIAL SUBCHONDROPLASTY;  Surgeon: Mcarthur Rossetti, MD;  Location[facility];  Service: Orthopedics;  Laterality: Left;   knee injections Bilateral    LUMBAR DISC SURGERY  09/12/2000   left  L5 -- S1   PROSTATE BIOPSY N/A 05/22/2013   Procedure: BIOPSY TRANSRECTAL ULTRASONIC PROSTATE (TUBP);  Surgeon: [name], MD;  Location: [facility];  Service: Urology;  Laterality: N/A;   RIGHT/LEFT HEART CATH AND CORONARY ANGIOGRAPHY N/A 06/25/2020   Procedure: RIGHT/LEFT HEART CATH AND CORONARY  ANGIOGRAPHY;  Surgeon: Belva Crome, MD;  Location: [facility] [place];:; Normal coronary arteries; LVEDP 23 mmHg 370.  19 mmHg.   ROTATOR CUFF REPAIR Right 2016   SPACE OAR INSTILLATION N/A 07/23/2021   Procedure: SPACE OAR INSTILLATION;  Surgeon: Ardis Hughs, MD;  [facility];  Service: Urology;  Laterality: N/A;   THROAT SURGERY  04/20/2018   TRANSRECTAL ULTRASOUND PROSTATE BX  05-23-2005  &  04-19-2001   TRANSTHORACIC ECHOCARDIOGRAM  09/2017   EF 45-50 % (previously reported as 40 to 45%).  Incoordinate septal motion with mild LVH.  GR 1 DD.  Aortic sclerosis but no stenosis.   TRANSTHORACIC ECHOCARDIOGRAM  10/06/2020   Severe septal and lateral wall    Current Outpatient Medications  Medication Instructions   aspirin EC 81 mg, Oral, Daily   carvedilol (COREG) 3.125 mg, Oral, 2 times daily with meals   colchicine 0.6 MG tablet 2 pills initially with flare, then up to BID for few days if needed.   finasteride (PROSCAR) 5 mg, Oral, Daily   Fish Oil 500 mg, Oral, Daily   furosemide (LASIX) 40 MG tablet TAKE 1 TABLET TWICE WEEKLY AS NEEDED AS DIRECTED   gabapentin (NEURONTIN) 300-600 mg, Oral, 3 times daily PRN   hydrALAZINE (APRESOLINE) 25 mg, Oral, 2 times daily   magnesium oxide (MAG-OX) 400 mg, Oral, Daily   Multiple Vitamin (MULTIVITAMIN WITH MINERALS) TABS tablet 1 tablet, Oral, Daily, One-A-Day Multivitamin    rosuvastatin (CRESTOR) 10 mg, Oral, Daily   sildenafil (VIAGRA) 100 MG tablet 1/2 to 1 tablet up to each day as needed.   tamsulosin (FLOMAX) 0.4 MG CAPS capsule TAKE 1 CAPSULE EVERY DAY   Turmeric 500 mg, Oral, Daily   vitamin B-12 (CYANOCOBALAMIN) 500 mcg, Oral, Daily   Zinc 50 mg, Oral, Daily    Allergies as of 09/22/2021 - Review Complete 09/22/2021  Allergen Reaction Noted   Nitroglycerin Anaphylaxis 07/08/2011   Crestor [rosuvastatin] Other (See Comments) 02/27/2020   Ace inhibitors Swelling 07/08/2011   Lovastatin Nausea Only 08/15/2012    Solarcaine [benzocaine] Dermatitis 04/21/2015    Family History  Problem Relation Age of Onset   Arthritis Mother    COPD Father    Lung cancer Father    Cancer Sister        type unknown   Breast cancer Sister    Lung cancer Sister    Cardiomyopathy Other        idiopathic. trivial disease 2004 cath, 2010 cardiac CT - no sig. dz, nl LV fxn. 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  Tobacco Use   Smoking status: Former    Packs/day: 2.00    Years: 20.00    Total pack years: 40.00    Types: Cigarettes    Quit date: 03/28/1980    Years since quitting: 41.5   Smokeless tobacco: Never  Vaping Use   Vaping Use: Never used  Substance and Sexual Activity   Alcohol use: No    Alcohol/week: 0.0 standard drinks of alcohol   Drug use: No   Sexual activity: Not on file  Other Topics Concern   Not on file  Social History Narrative   He walks on a relatively regular basis about 15 minutes at a time mostly to the discomfort. He previously had walked up a 30-40 minutes a time without problems. Education: [employer].   Lives alone.   Right-handed.   One cup coffee daily.   Social Determinants of Health   Financial Resource Strain: Not on file  Food Insecurity: Not on file  Transportation Needs: Not on file  Physical Activity: Not on file  Stress: Not on file  Social Connections: Not on file  Intimate Partner Violence: Not on file     Physical Exam: Ht 5' 7.5" (1.715 m) Comment: height measured without shoes  Wt 184 lb (83.5 kg)   BMI 28.39 kg/m  Constitutional: generally well-appearing Psychiatric: alert and oriented x3 Eyes: extraocular movements intact Mouth: oral pharynx  moist, no lesions Neck: supple no lymphadenopathy Cardiovascular: heart regular rate and rhythm Lungs: clear to auscultation bilaterally Abdomen: soft, nontender, nondistended, no obvious ascites, no peritoneal signs, normal bowel sounds Extremities: no lower extremity edema bilaterally Skin: no lesions on visible extremities   Assessment and plan: 76 y.o. male with personal history of precancerous colon polyps  He had a single subcentimeter tubular adenoma removed about 6 and half years ago.  He does not need a colonoscopy at this point as the usual recommended recall is 7 to 10 years for this type of polyp finding.  He understands that he is at high risk for complications during colonoscopy given his very low cardiac ejection fraction.  Notably he seems quite active and I was surprised at how good he looks clinically given his low ejection fraction.  He will return to see me in 6 months and we can revisit the issue of colonoscopy for polyp surveillance at that time.  He has had some minor bowel changes related to radiation from his prostate cancer.  Those are much improved, he will continue taking Colace once daily for now and hopefully will need less of that as time goes by.  Please see the "Patient Instructions" section for addition details about the plan.   [name] Loffler, MD [facility] 09/22/2021, 8:46 AM  Cc: [name], MD  Total time on date of encounter was 46  minutes (this included time spent preparing to see the patient reviewing records; obtaining and/or reviewing separately obtained history; performing a medically appropriate exam and/or evaluation; counseling and educating the patient and family if present; ordering medications, tests or procedures if applicable; and documenting clinical information in the health record).

## 2021-09-22 NOTE — Patient Instructions (Signed)
If you are age 77 or older, your body mass index should be between 23-30. Your Body mass index is 28.39 kg/m. If this is out of the aforementioned range listed, please consider follow up with your Primary Care Provider. ________________________________________________________  The [facility] providers would like to encourage you to use [facility] to communicate with providers for non-urgent requests or questions.  Due to long hold times on the telephone, sending your provider a message by [facility] may be a faster and more efficient way to get a response.  Please allow 48 business hours for a response.  Please remember that this is for non-urgent requests.  _______________________________________________________  Dennis Bast will follow up in our office in 6 months (December 2023).  We will contact you to get this appointment scheduled.  Thank you for entrusting me with your care and choosing [facility].  Dr Ardis Hughs

## 2021-10-01 ENCOUNTER — Encounter: Payer: Self-pay | Admitting: Internal Medicine

## 2021-10-01 ENCOUNTER — Ambulatory Visit: Payer: Medicare HMO | Admitting: Internal Medicine

## 2021-10-01 VITALS — BP 118/68 | HR 64 | Ht 67.0 in | Wt 185.0 lb

## 2021-10-01 DIAGNOSIS — I5022 Chronic systolic (congestive) heart failure: Secondary | ICD-10-CM | POA: Diagnosis not present

## 2021-10-01 DIAGNOSIS — I447 Left bundle-branch block, unspecified: Secondary | ICD-10-CM

## 2021-10-01 NOTE — Progress Notes (Unsigned)
HPI Mr. Fayson returns today to discuss insertion of a biv ICD. The patient is a pleasant 76 yo man with chronic systolic heart failure and a non-ischemic CM, and LBBB. The patient has not had syncope. I discussed BIV ICD vs biv PM insertion over a year ago and he has undergone watchful waiting. He has class 2 CHF symptoms. He has known allergy to ACE inhibitor and other meds like nitrates intolerated due to low bp.  Allergies  Allergen Reactions   Nitroglycerin Anaphylaxis    "Cardiologist suggested he shouldn't take this med because it could kill him with his heart condition. Lowers BP"   Crestor [Rosuvastatin] Other (See Comments)    Body aches   Ace Inhibitors Swelling   Lovastatin Nausea Only   Solarcaine [Benzocaine] Dermatitis     Current Outpatient Medications  Medication Sig Dispense Refill   aspirin EC 81 MG tablet Take 1 tablet (81 mg total) by mouth daily.     carvedilol (COREG) 3.125 MG tablet TAKE 1 TABLET (3.125 MG TOTAL) BY MOUTH 2 (TWO) TIMES DAILY WITH A MEAL. 180 tablet 1   colchicine 0.6 MG tablet 2 pills initially with flare, then up to BID for few days if needed. (Patient taking differently: Take 0.6 mg by mouth as needed (gout flare).) 20 tablet 0   finasteride (PROSCAR) 5 MG tablet Take 5 mg by mouth daily.     furosemide (LASIX) 40 MG tablet TAKE 1 TABLET TWICE WEEKLY AS NEEDED AS DIRECTED 24 tablet 0   gabapentin (NEURONTIN) 300 MG capsule Take 1-2 capsules (300-600 mg total) by mouth 3 (three) times daily as needed (pain). 540 capsule 1   hydrALAZINE (APRESOLINE) 25 MG tablet Take 1 tablet (25 mg total) by mouth in the morning and at bedtime. 180 tablet 3   magnesium oxide (MAG-OX) 400 MG tablet Take 400 mg by mouth daily.     Multiple Vitamin (MULTIVITAMIN WITH MINERALS) TABS tablet Take 1 tablet by mouth daily. One-A-Day Multivitamin     Omega-3 Fatty Acids (FISH OIL) 500 MG CAPS Take 500 mg by mouth daily.     sildenafil (VIAGRA) 100 MG tablet 1/2 to 1  tablet up to each day as needed. 10 tablet 5   tamsulosin (FLOMAX) 0.4 MG CAPS capsule TAKE 1 CAPSULE EVERY DAY 90 capsule 3   Turmeric 500 MG TABS Take 500 mg by mouth daily.     vitamin B-12 (CYANOCOBALAMIN) 500 MCG tablet Take 500 mcg by mouth daily.     Zinc 50 MG TABS Take 50 mg by mouth daily.     rosuvastatin (CRESTOR) 10 MG tablet Take 1 tablet (10 mg total) by mouth daily. 90 tablet 3   Current Facility-Administered Medications  Medication Dose Route Frequency Provider Last Rate Last Admin   sodium chloride flush (NS) 0.9 % injection 3 mL  3 mL Intravenous Q12H Deberah Pelton, NP         Past Medical History:  Diagnosis Date   Arthritis    BPH (benign prostatic hypertrophy)    CHF (congestive heart failure) ([facility])    Coronary atherosclerosis of native coronary vessel    CATH 2004--  MINIMAL NONOBSTRUCTIVE LAD/  NORMAL EF/    CARDIAC CT  03/2008  NO SIGNIFICANT DISEASE  AND  nlef   Elevated PSA    Eye problem 03/06/2016   [name] retinal detachment left eye, no sx done   History of gout    pt 05-20-2013 states  stable    History of melanoma excision    2011-- LEFT UPPER BACK   Hyperlipemia    Hypertriglyceridemia    Left bundle branch block    08/2017   Melanoma ([place]) 2011   back   Nonischemic cardiomyopathy ([place])    09-04-2009  --  2D echo-EF 40-45%, Global Hypokinesis - 09/2017 -> EF 45-50%    Numbness    hands   Personal history of arthritis    Personal history of other diseases of circulatory system    Pre-diabetes     ROS:   All systems reviewed and negative except as noted in the HPI.   Past Surgical History:  Procedure Laterality Date   ANTERIOR CERVICAL DECOMP/DISCECTOMY FUSION N/A 04/20/2018   Procedure: Anterior Cervical Decompression Fusion - Cervical Three-Cervical Four - Cervical Four-Cervical Five - Cervical Five-Cervical Six;  Surgeon: Eustace Moore, MD;  Location: [place];  Service: Neurosurgery;  Laterality: N/A;  Anterior Cervical  Decompression Fusion - Cervical Three-Cervical Four - Cervical Four-Cervical Five - Cervical Five-Cervical Six   APPENDECTOMY  AS CHILD   BACK SURGERY     CARDIAC CATHETERIZATION  05/23/2002  &  05-23-1998   minimal irregularities in the LAD/  EF 50%  -   DR AL LITTLE   CARDIOVASCULAR STRESS TEST  10/05/1998   MILD GLOBAL HYPOKINESIS AND ISCHEMIAIN ANTEROSEPTAL  AT [facility]/ EF 42%   CARPAL TUNNEL RELEASE Right    EXCISION MELANOMA LEFT UPPER BACK  10/14/2009   eye lid surgery Bilateral 2018   GOLD SEED IMPLANT N/A 07/23/2021   Procedure: GOLD SEED IMPLANT;  Surgeon: Ardis Hughs, MD;  [facility];  Service: Urology;  Laterality: N/A;   KNEE ARTHROSCOPY WITH SUBCHONDROPLASTY Left 04/07/2017   Procedure: LEFT KNEE ARTHROSCOPY WITH PARTIAL MEDIAL MENISCECTOMY AND MEDIAL TIBIAL SUBCHONDROPLASTY;  Surgeon: Mcarthur Rossetti, MD;  Location[facility];  Service: Orthopedics;  Laterality: Left;   knee injections Bilateral    LUMBAR DISC SURGERY  09/12/2000   left  L5 -- S1   PROSTATE BIOPSY N/A 05/22/2013   Procedure: BIOPSY TRANSRECTAL ULTRASONIC PROSTATE (TUBP);  Surgeon: [name], MD;  Location: [facility];  Service: Urology;  Laterality: N/A;   RIGHT/LEFT HEART CATH AND CORONARY ANGIOGRAPHY N/A 06/25/2020   Procedure: RIGHT/LEFT HEART CATH AND CORONARY ANGIOGRAPHY;  Surgeon: Belva Crome, MD;  Location: [facility];:; Normal coronary arteries; LVEDP 23 mmHg 370.  19 mmHg.   ROTATOR CUFF REPAIR Right 2016   SPACE OAR INSTILLATION N/A 07/23/2021   Procedure: SPACE OAR INSTILLATION;  Surgeon: Ardis Hughs, MD;  [facility];  Service: Urology;  Laterality: N/A;   THROAT SURGERY  04/20/2018   TRANSRECTAL ULTRASOUND PROSTATE BX  05-23-2005  &  04-19-2001   TRANSTHORACIC ECHOCARDIOGRAM  09/2017   EF 45-50 % (previously reported as 40 to 45%).  Incoordinate septal motion with mild LVH.  GR 1 DD.  Aortic sclerosis but no stenosis.   TRANSTHORACIC  ECHOCARDIOGRAM  10/06/2020   Severe septal and lateral wall     Family History  Problem Relation Age of Onset   Arthritis Mother    COPD Father    Lung cancer Father    Cancer Sister        type unknown   Breast cancer Sister    Lung cancer Sister    Cardiomyopathy Other        idiopathic. trivial disease 2004 cath, 2010 cardiac CT - no sig. dz,  nl LV fxn. 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  Tobacco Use   Smoking status: Former    Packs/day: 2.00    Years: 20.00    Total pack years: 40.00    Types: Cigarettes    Quit date: 03/28/1980    Years since quitting: 41.5   Smokeless tobacco: Never  Vaping Use   Vaping Use: Never used  Substance and Sexual Activity   Alcohol use: No    Alcohol/week: 0.0 standard drinks of alcohol   Drug use: No   Sexual activity: Not on file  Other Topics Concern   Not on file  Social History Narrative   He walks on a relatively regular basis about 15 minutes at a time mostly to the discomfort. He previously had walked up a 30-40 minutes a time without problems. Education: [employer].   Lives alone.   Right-handed.   One cup coffee daily.   Social Determinants of Health   Financial Resource Strain: Not on file  Food Insecurity: Not on file  Transportation Needs: Not on file  Physical Activity: Not on file  Stress: Not on file  Social Connections: Not on file  Intimate Partner Violence: Not on file     BP 118/68   Pulse 64   Ht '5\' 7"'$  (1.702 m)   Wt 185 lb (83.9 kg)   SpO2 98%   BMI 28.98 kg/m   Physical Exam:  Well appearing NAD HEENT: Unremarkable Neck:  No JVD, no thyromegally Lymphatics:  No adenopathy Back:  No CVA  tenderness Lungs:  Clear with no wheezes HEART:  Regular rate rhythm, no murmurs, no rubs, no clicks Abd:  soft, positive bowel sounds, no organomegally, no rebound, no guarding Ext:  2 plus pulses, no edema, no cyanosis, no clubbing Skin:  No rashes no nodules Neuro:  CN II through XII intact, motor grossly intact  EKG - reviewed. NSR with LBBBB  Assess/Plan:  Chronic systolic heart failure - I discussed the indications/risks/benefits/goals/expectations of BIV ICD insertion and he wishes to proceed. Hopefully we can uptitrate his meds once his device is in place.  Carleene Overlie Jerzee Jerome,MD

## 2021-10-01 NOTE — Patient Instructions (Addendum)
Medication Instructions:  Your physician recommends that you continue on your current medications as directed. Please refer to the Current Medication list given to you today.  *If you need a refill on your cardiac medications before your next appointment, please call your pharmacy*  Lab Work: You will have blood work drawn today:  CBC and BMP  If you have labs (blood work) drawn today and your tests are completely normal, you will receive your results only by: [place] (if you have [place]) OR A paper copy in the mail If you have any lab test that is abnormal or we need to change your treatment, we will call you to review the results.  Testing/Procedures: None ordered.  Follow-Up: At [facility], you and your health needs are our priority.  As part of our continuing mission to provide you with exceptional heart care, we have created designated Provider Care Teams.  These Care Teams include your primary Cardiologist (physician) and Advanced Practice Providers (APPs -  Physician Assistants and Nurse Practitioners) who all work together to provide you with the care you need, when you need it.  We recommend signing up for the patient portal called "MyChart".  Sign up information is provided on this After Visit Summary.  MyChart is used to connect with patients for Virtual Visits (Telemedicine).  Patients are able to view lab/test results, encounter notes, upcoming appointments, etc.  Non-urgent messages can be sent to your provider as well.   To learn more about what you can do with MyChart, go to [email or web address].    Your next appointment:    SEE INSTRUCTION LETTER  Provider: F/u with Dr. Cristopher Peru 91 days after your procedure.      Important Information About Sugar

## 2021-10-02 ENCOUNTER — Encounter: Payer: Self-pay | Admitting: Cardiology

## 2021-10-02 LAB — CBC WITH DIFFERENTIAL/PLATELET
Basophils Absolute: 0.1 10*3/uL (ref 0.0–0.2)
Basos: 1 %
EOS (ABSOLUTE): 0.2 10*3/uL (ref 0.0–0.4)
Eos: 4 %
Hematocrit: 37.6 % (ref 37.5–51.0)
Hemoglobin: 12.5 g/dL — ABNORMAL LOW (ref 13.0–17.7)
Immature Grans (Abs): 0 10*3/uL (ref 0.0–0.1)
Immature Granulocytes: 0 %
Lymphocytes Absolute: 1.4 10*3/uL (ref 0.7–3.1)
Lymphs: 26 %
MCH: 30 pg (ref 26.6–33.0)
MCHC: 33.2 g/dL (ref 31.5–35.7)
MCV: 90 fL (ref 79–97)
Monocytes Absolute: 0.5 10*3/uL (ref 0.1–0.9)
Monocytes: 9 %
Neutrophils Absolute: 3.3 10*3/uL (ref 1.4–7.0)
Neutrophils: 60 %
Platelets: 146 10*3/uL — ABNORMAL LOW (ref 150–450)
RBC: 4.17 x10E6/uL (ref 4.14–5.80)
RDW: 13.1 % (ref 11.6–15.4)
WBC: 5.5 10*3/uL (ref 3.4–10.8)

## 2021-10-02 LAB — BASIC METABOLIC PANEL
BUN/Creatinine Ratio: 22 (ref 10–24)
BUN: 23 mg/dL (ref 8–27)
CO2: 20 mmol/L (ref 20–29)
Calcium: 9.5 mg/dL (ref 8.6–10.2)
Chloride: 104 mmol/L (ref 96–106)
Creatinine, Ser: 1.04 mg/dL (ref 0.76–1.27)
Glucose: 114 mg/dL — ABNORMAL HIGH (ref 70–99)
Potassium: 4.8 mmol/L (ref 3.5–5.2)
Sodium: 139 mmol/L (ref 134–144)
eGFR: 74 mL/min/{1.73_m2} (ref >59)

## 2021-10-02 NOTE — Assessment & Plan Note (Addendum)
Significant reduction in EF down to 20 to 25% with significant septal and lateral dyssynchrony suggestive of LBBB mediated etiology.  He initially had deferred BiV ICD, but now at this point I think we need to move forward.  He is extremely fatigued and getting worn out.  I worry that if we do not do something at this point, he may never benefit in the future.  I am leery of pushing his meds any further than low-dose carvedilol and hydralazine.  He is on a standing dose of Lasix which she is only taking it twice a week and not using any more doses than that.  I think he probably could use it additionally 1 to 2 days extra week if necessary. With his blood pressure and prostate issues with frequent urination, I am leery of adding spironolactone.  No ARB/Arni because of issues with previously documented ACE inhibitor allergy.  Since he maxed out on therapy with no further options besides CRT-D/P.  Refer back to Dr. Lovena Le to consider therapy.  He is now agreeable to proceed.

## 2021-10-02 NOTE — Assessment & Plan Note (Signed)
Nonischemic electrical abnormality leading to significant drop in EF.  I do think he would benefit of BiV ICD/CRT-D or even CRT-P depending on what his discussion with Dr. Lovena Le leads to.  We will refer back to Dr. Lovena Le as he is now willing to discuss CRT-P/CRT-D

## 2021-10-02 NOTE — Assessment & Plan Note (Signed)
On rosuvastatin.  Labs followed by PCP.  With relatively normal coronaries, no need to be overly aggressive.

## 2021-10-02 NOTE — Assessment & Plan Note (Signed)
I think exertional dyspnea is related to his low EF.  He is not really having volume issues, I think is simply just low output.  Not a lot of room to increase medical management further.  Referral back to EP for consideration of CRT-P/D

## 2021-10-14 ENCOUNTER — Telehealth: Payer: Self-pay | Admitting: *Deleted

## 2021-10-14 NOTE — Telephone Encounter (Signed)
RETURNED PATIENT'S PHONE CALL, SPOKE WITH PATIENT. ?

## 2021-10-22 DIAGNOSIS — R351 Nocturia: Secondary | ICD-10-CM | POA: Diagnosis not present

## 2021-10-22 DIAGNOSIS — C61 Malignant neoplasm of prostate: Secondary | ICD-10-CM | POA: Diagnosis not present

## 2021-10-22 DIAGNOSIS — N401 Enlarged prostate with lower urinary tract symptoms: Secondary | ICD-10-CM | POA: Diagnosis not present

## 2021-10-25 NOTE — Pre-Procedure Instructions (Signed)
Attempted to call patient regarding procedure instructions for Wednesday.  Left voice mail on the following items: Arrival time 0830 Nothing to eat or drink after midnight No meds AM of procedure Responsible person to drive you home and stay with you for 24 hrs Wash with special soap night before and morning of procedure

## 2021-10-26 ENCOUNTER — Telehealth: Payer: Self-pay | Admitting: Internal Medicine

## 2021-10-26 NOTE — Telephone Encounter (Signed)
Pt called regarding questions about tomorrows BiV ICD placement with Dr. Lovena Le.    Questions answered and pt understood.

## 2021-10-26 NOTE — Pre-Procedure Instructions (Signed)
Attempted to call patient regarding procedure instructions.  No answer 

## 2021-10-26 NOTE — Telephone Encounter (Signed)
New Message:     Patientis having a procedure at the hospital tomoroow. He have not received any instructions for tomorrow.

## 2021-10-27 ENCOUNTER — Ambulatory Visit (HOSPITAL_COMMUNITY): Payer: Medicare HMO

## 2021-10-27 ENCOUNTER — Encounter (HOSPITAL_COMMUNITY)
Admission: RE | Disposition: A | Discharge status: Home / Self Care | Payer: Self-pay | Source: Home / Self Care | Attending: Internal Medicine

## 2021-10-27 ENCOUNTER — Ambulatory Visit: Payer: Self-pay | Admitting: Urology

## 2021-10-27 ENCOUNTER — Other Ambulatory Visit: Payer: Self-pay

## 2021-10-27 ENCOUNTER — Ambulatory Visit (HOSPITAL_COMMUNITY)
Admission: RE | Admit: 2021-10-27 | Discharge: 2021-10-28 | Disposition: A | Discharge status: Home / Self Care | Payer: Medicare HMO | Source: Home / Self Care | Attending: Internal Medicine | Admitting: Internal Medicine

## 2021-10-27 DIAGNOSIS — R0789 Other chest pain: Secondary | ICD-10-CM | POA: Diagnosis not present

## 2021-10-27 DIAGNOSIS — I428 Other cardiomyopathies: Secondary | ICD-10-CM | POA: Insufficient documentation

## 2021-10-27 DIAGNOSIS — I5022 Chronic systolic (congestive) heart failure: Secondary | ICD-10-CM | POA: Insufficient documentation

## 2021-10-27 DIAGNOSIS — E781 Pure hyperglyceridemia: Secondary | ICD-10-CM | POA: Diagnosis present

## 2021-10-27 DIAGNOSIS — I3139 Other pericardial effusion (noninflammatory): Secondary | ICD-10-CM | POA: Diagnosis present

## 2021-10-27 DIAGNOSIS — Z95810 Presence of automatic (implantable) cardiac defibrillator: Secondary | ICD-10-CM | POA: Diagnosis not present

## 2021-10-27 DIAGNOSIS — I959 Hypotension, unspecified: Secondary | ICD-10-CM | POA: Diagnosis present

## 2021-10-27 DIAGNOSIS — Z85820 Personal history of malignant melanoma of skin: Secondary | ICD-10-CM | POA: Diagnosis not present

## 2021-10-27 DIAGNOSIS — M549 Dorsalgia, unspecified: Secondary | ICD-10-CM | POA: Diagnosis not present

## 2021-10-27 DIAGNOSIS — Z4502 Encounter for adjustment and management of automatic implantable cardiac defibrillator: Secondary | ICD-10-CM

## 2021-10-27 DIAGNOSIS — I9789 Other postprocedural complications and disorders of the circulatory system, not elsewhere classified: Secondary | ICD-10-CM | POA: Diagnosis not present

## 2021-10-27 DIAGNOSIS — T82847A Pain from cardiac prosthetic devices, implants and grafts, initial encounter: Secondary | ICD-10-CM | POA: Diagnosis present

## 2021-10-27 DIAGNOSIS — I314 Cardiac tamponade: Secondary | ICD-10-CM | POA: Diagnosis present

## 2021-10-27 DIAGNOSIS — T82120A Displacement of cardiac electrode, initial encounter: Secondary | ICD-10-CM | POA: Diagnosis not present

## 2021-10-27 DIAGNOSIS — Z8739 Personal history of other diseases of the musculoskeletal system and connective tissue: Secondary | ICD-10-CM | POA: Diagnosis not present

## 2021-10-27 DIAGNOSIS — Z888 Allergy status to other drugs, medicaments and biological substances status: Secondary | ICD-10-CM | POA: Diagnosis not present

## 2021-10-27 DIAGNOSIS — Z87891 Personal history of nicotine dependence: Secondary | ICD-10-CM | POA: Diagnosis not present

## 2021-10-27 DIAGNOSIS — I447 Left bundle-branch block, unspecified: Secondary | ICD-10-CM | POA: Diagnosis present

## 2021-10-27 DIAGNOSIS — Z8261 Family history of arthritis: Secondary | ICD-10-CM | POA: Diagnosis not present

## 2021-10-27 DIAGNOSIS — Z950 Presence of cardiac pacemaker: Secondary | ICD-10-CM | POA: Diagnosis not present

## 2021-10-27 DIAGNOSIS — Z452 Encounter for adjustment and management of vascular access device: Secondary | ICD-10-CM | POA: Diagnosis not present

## 2021-10-27 DIAGNOSIS — Z79899 Other long term (current) drug therapy: Secondary | ICD-10-CM | POA: Diagnosis not present

## 2021-10-27 DIAGNOSIS — Y838 Other surgical procedures as the cause of abnormal reaction of the patient, or of later complication, without mention of misadventure at the time of the procedure: Secondary | ICD-10-CM | POA: Diagnosis present

## 2021-10-27 DIAGNOSIS — Z981 Arthrodesis status: Secondary | ICD-10-CM | POA: Diagnosis not present

## 2021-10-27 DIAGNOSIS — I9589 Other hypotension: Secondary | ICD-10-CM | POA: Diagnosis not present

## 2021-10-27 DIAGNOSIS — M1990 Unspecified osteoarthritis, unspecified site: Secondary | ICD-10-CM | POA: Diagnosis present

## 2021-10-27 DIAGNOSIS — R079 Chest pain, unspecified: Secondary | ICD-10-CM | POA: Diagnosis present

## 2021-10-27 DIAGNOSIS — N400 Enlarged prostate without lower urinary tract symptoms: Secondary | ICD-10-CM | POA: Diagnosis present

## 2021-10-27 DIAGNOSIS — Z7982 Long term (current) use of aspirin: Secondary | ICD-10-CM | POA: Diagnosis not present

## 2021-10-27 DIAGNOSIS — Z9049 Acquired absence of other specified parts of digestive tract: Secondary | ICD-10-CM | POA: Diagnosis not present

## 2021-10-27 DIAGNOSIS — R0781 Pleurodynia: Secondary | ICD-10-CM | POA: Diagnosis not present

## 2021-10-27 DIAGNOSIS — E785 Hyperlipidemia, unspecified: Secondary | ICD-10-CM | POA: Diagnosis present

## 2021-10-27 HISTORY — PX: BIV ICD INSERTION CRT-D: EP1195

## 2021-10-27 HISTORY — DX: Encounter for adjustment and management of automatic implantable cardiac defibrillator: Z45.02

## 2021-10-27 SURGERY — BIV ICD INSERTION CRT-D

## 2021-10-27 MED ORDER — SODIUM CHLORIDE 0.9 % IV SOLN: INTRAVENOUS | Status: DC

## 2021-10-27 MED ORDER — ACETAMINOPHEN 325 MG PO TABS
325.0000 mg | ORAL_TABLET | ORAL | Status: DC | PRN
  Administered 2021-10-27: 650 mg via ORAL
  Filled 2021-10-27: qty 2

## 2021-10-27 MED ORDER — CEFAZOLIN SODIUM-DEXTROSE 1-4 GM/50ML-% IV SOLN
1.0000 g | Freq: Once | INTRAVENOUS | Status: AC
  Administered 2021-10-27 (×2): 1 g via INTRAVENOUS
  Filled 2021-10-27 (×2): qty 50

## 2021-10-27 MED ORDER — SODIUM CHLORIDE 0.9 % IV SOLN
80.0000 mg | INTRAVENOUS | Status: AC
  Administered 2021-10-27: 80 mg

## 2021-10-27 MED ORDER — CEFAZOLIN SODIUM-DEXTROSE 2-4 GM/100ML-% IV SOLN
2.0000 g | INTRAVENOUS | Status: AC
  Administered 2021-10-27: 2 g via INTRAVENOUS

## 2021-10-27 MED ORDER — MIDAZOLAM HCL 5 MG/5ML IJ SOLN
INTRAMUSCULAR | Status: AC
  Filled 2021-10-27: qty 5

## 2021-10-27 MED ORDER — MIDAZOLAM HCL 5 MG/5ML IJ SOLN
INTRAMUSCULAR | Status: DC | PRN
  Administered 2021-10-27: 1 mg via INTRAVENOUS
  Administered 2021-10-27: 2 mg via INTRAVENOUS
  Administered 2021-10-27: 1 mg via INTRAVENOUS
  Administered 2021-10-27: 2 mg via INTRAVENOUS

## 2021-10-27 MED ORDER — SODIUM CHLORIDE 0.9 % IV SOLN
INTRAVENOUS | Status: AC
  Filled 2021-10-27: qty 2

## 2021-10-27 MED ORDER — SODIUM CHLORIDE 0.9 % IV SOLN
INTRAVENOUS | Status: DC | PRN
  Administered 2021-10-27: 80 mg

## 2021-10-27 MED ORDER — FENTANYL CITRATE (PF) 100 MCG/2ML IJ SOLN
INTRAMUSCULAR | Status: AC
  Filled 2021-10-27: qty 2

## 2021-10-27 MED ORDER — LIDOCAINE HCL (PF) 1 % IJ SOLN
INTRAMUSCULAR | Status: DC | PRN
  Administered 2021-10-27: 60 mL

## 2021-10-27 MED ORDER — POVIDONE-IODINE 10 % EX SWAB: 2.0000 | Freq: Once | CUTANEOUS | Status: DC

## 2021-10-27 MED ORDER — LIDOCAINE HCL (PF) 1 % IJ SOLN
INTRAMUSCULAR | Status: AC
  Filled 2021-10-27: qty 60

## 2021-10-27 MED ORDER — HEPARIN (PORCINE) IN NACL 1000-0.9 UT/500ML-% IV SOLN
INTRAVENOUS | Status: DC | PRN
  Administered 2021-10-27: 500 mL

## 2021-10-27 MED ORDER — CEFAZOLIN SODIUM-DEXTROSE 2-4 GM/100ML-% IV SOLN
INTRAVENOUS | Status: AC
  Filled 2021-10-27: qty 100

## 2021-10-27 MED ORDER — ONDANSETRON HCL 4 MG/2ML IJ SOLN: 4.0000 mg | Freq: Four times a day (QID) | INTRAMUSCULAR | Status: DC | PRN

## 2021-10-27 MED ORDER — IODIXANOL 320 MG/ML IV SOLN
INTRAVENOUS | Status: DC | PRN
  Administered 2021-10-27: 25 mL

## 2021-10-27 MED ORDER — FENTANYL CITRATE (PF) 100 MCG/2ML IJ SOLN
INTRAMUSCULAR | Status: DC | PRN
  Administered 2021-10-27: 12.5 ug via INTRAVENOUS
  Administered 2021-10-27: 25 ug via INTRAVENOUS
  Administered 2021-10-27: 12.5 ug via INTRAVENOUS
  Administered 2021-10-27: 25 ug via INTRAVENOUS

## 2021-10-27 MED ORDER — CHLORHEXIDINE GLUCONATE 4 % EX LIQD: 4.0000 | Freq: Once | CUTANEOUS | Status: DC

## 2021-10-27 SURGICAL SUPPLY — 27 items
BALLN COR SINUS VENO 6FR 80 (BALLOONS) ×2
BALLOON COR SINUS VENO 6FR 80 (BALLOONS) IMPLANT
CABLE SURGICAL S-101-97-12 (CABLE) ×2 IMPLANT
CATH CPS DIRECT 135 DS2C020 (CATHETERS) ×1 IMPLANT
CATH CPS LOCATOR 3D MED (CATHETERS) ×1 IMPLANT
CATH CPS QUART SUB DS2N026-59 (CATHETERS) ×1 IMPLANT
CATH HEX JOS 2-5-2 65CM 6F REP (CATHETERS) ×1 IMPLANT
CPS IMPLANT KIT 410190 (MISCELLANEOUS) ×1 IMPLANT
HELIX LOCKING TOOL (MISCELLANEOUS) ×2
ICD UNIFY ASURA CRT CD3357-40C (ICD Generator) ×1 IMPLANT
KIT LEAD END CAP (Cap) IMPLANT
LEAD DURATA 7122-65CM (Lead) ×1 IMPLANT
LEAD DURATA 7122Q-65CM (Lead) ×1 IMPLANT
LEAD END CAP (Cap) ×1 IMPLANT
LEAD TENDRIL MRI 52CM LPA1200M (Lead) ×1 IMPLANT
LEAD TENDRIL STS 2088TC-65 (Lead) ×1 IMPLANT
PAD DEFIB RADIO PHYSIO CONN (PAD) ×2 IMPLANT
PIN PLUG IS-1 DEFIB (PIN) ×1 IMPLANT
SHEATH 7FR PRELUDE SNAP 13 (SHEATH) ×3 IMPLANT
SHEATH 8FR PRELUDE SNAP 13 (SHEATH) ×1 IMPLANT
SHEATH 9FR PRELUDE SNAP 13 (SHEATH) ×1 IMPLANT
SHEATH WORLEY 9FR 62CM (SHEATH) ×1 IMPLANT
SLITTER AGILIS HISPRO (INSTRUMENTS) ×1 IMPLANT
SLITTER UNIVERSAL DS2A003 (MISCELLANEOUS) ×1 IMPLANT
TOOL HELIX LOCKING (MISCELLANEOUS) IMPLANT
TRAY PACEMAKER INSERTION (PACKS) ×2 IMPLANT
WIRE ACUITY WHISPER EDS 4648 (WIRE) ×1 IMPLANT

## 2021-10-27 NOTE — Progress Notes (Signed)
Dr Lovena Le notified of post CXR report-advsd to proceed with d/c

## 2021-10-27 NOTE — Discharge Instructions (Signed)
    Supplemental Discharge Instructions for  Pacemaker/Defibrillator Patients  Tomorrow, 10/28/21, send in a device transmission  Activity No heavy lifting or vigorous activity with your left/right arm for 6 to 8 weeks.  Do not raise your left/right arm above your head for one week.  Gradually raise your affected arm as drawn below.             11/01/21                       11/02/21                    11/03/21                   11/04/21 __  NO DRIVING for  1 week   ; you may begin driving on  03/28/73  .  WOUND CARE Keep the wound area clean and dry.  Do not get this area wet , no showers for one week; you may shower on  11/04/21   . Tomorrow, 10/28/21, remove the arm sling Tomorrow, 10/28/21 remove the LARGE outer plastic bandage.  Underneath the plastic bandage there are steri strips (paper tapes), DO NOT remove these. The tape/steri-strips on your wound will fall off; do not pull them off.  No bandage is needed on the site.  DO  NOT apply any creams, oils, or ointments to the wound area. If you notice any drainage or discharge from the wound, any swelling or bruising at the site, or you develop a fever > 101? F after you are discharged home, call the office at once.  Special Instructions You are still able to use cellular telephones; use the ear opposite the side where you have your pacemaker/defibrillator.  Avoid carrying your cellular phone near your device. When traveling through airports, show security personnel your identification card to avoid being screened in the metal detectors.  Ask the security personnel to use the hand wand. Avoid arc welding equipment, MRI testing (magnetic resonance imaging), TENS units (transcutaneous nerve stimulators).  Call the office for questions about other devices. Avoid electrical appliances that are in poor condition or are not properly grounded. Microwave ovens are safe to be near or to operate.  Additional information for defibrillator patients should  your device go off: If your device goes off ONCE and you feel fine afterward, notify the device clinic nurses. If your device goes off ONCE and you do not feel well afterward, call 911. If your device goes off TWICE, call 911. If your device goes off THREE times in one day, call 911.  DO NOT DRIVE YOURSELF OR A FAMILY MEMBER WITH A DEFIBRILLATOR TO THE HOSPITAL--CALL 911.

## 2021-10-28 ENCOUNTER — Encounter (HOSPITAL_COMMUNITY)
Admission: EM | Disposition: A | Discharge status: Home / Self Care | Payer: Self-pay | Source: Home / Self Care | Attending: Internal Medicine

## 2021-10-28 ENCOUNTER — Emergency Department (HOSPITAL_BASED_OUTPATIENT_CLINIC_OR_DEPARTMENT_OTHER): Payer: Medicare HMO

## 2021-10-28 ENCOUNTER — Observation Stay (HOSPITAL_BASED_OUTPATIENT_CLINIC_OR_DEPARTMENT_OTHER): Payer: Medicare HMO

## 2021-10-28 ENCOUNTER — Emergency Department (HOSPITAL_COMMUNITY): Payer: Medicare HMO

## 2021-10-28 ENCOUNTER — Inpatient Hospital Stay (HOSPITAL_COMMUNITY)
Admission: EM | Admit: 2021-10-28 | Discharge: 2021-11-02 | DRG: 227 | Disposition: A | Discharge status: Home / Self Care | Payer: Medicare HMO | Attending: Internal Medicine | Admitting: Internal Medicine

## 2021-10-28 ENCOUNTER — Encounter (HOSPITAL_COMMUNITY): Payer: Self-pay

## 2021-10-28 DIAGNOSIS — I314 Cardiac tamponade: Secondary | ICD-10-CM | POA: Diagnosis present

## 2021-10-28 DIAGNOSIS — T82120A Displacement of cardiac electrode, initial encounter: Secondary | ICD-10-CM

## 2021-10-28 DIAGNOSIS — N400 Enlarged prostate without lower urinary tract symptoms: Secondary | ICD-10-CM | POA: Diagnosis present

## 2021-10-28 DIAGNOSIS — I9589 Other hypotension: Secondary | ICD-10-CM | POA: Diagnosis not present

## 2021-10-28 DIAGNOSIS — Z85820 Personal history of malignant melanoma of skin: Secondary | ICD-10-CM

## 2021-10-28 DIAGNOSIS — Z8739 Personal history of other diseases of the musculoskeletal system and connective tissue: Secondary | ICD-10-CM

## 2021-10-28 DIAGNOSIS — Z79899 Other long term (current) drug therapy: Secondary | ICD-10-CM

## 2021-10-28 DIAGNOSIS — Z981 Arthrodesis status: Secondary | ICD-10-CM

## 2021-10-28 DIAGNOSIS — I3139 Other pericardial effusion (noninflammatory): Secondary | ICD-10-CM

## 2021-10-28 DIAGNOSIS — I428 Other cardiomyopathies: Secondary | ICD-10-CM | POA: Diagnosis present

## 2021-10-28 DIAGNOSIS — R079 Chest pain, unspecified: Secondary | ICD-10-CM | POA: Diagnosis not present

## 2021-10-28 DIAGNOSIS — Z95810 Presence of automatic (implantable) cardiac defibrillator: Secondary | ICD-10-CM

## 2021-10-28 DIAGNOSIS — I9789 Other postprocedural complications and disorders of the circulatory system, not elsewhere classified: Secondary | ICD-10-CM

## 2021-10-28 DIAGNOSIS — T829XXA Unspecified complication of cardiac and vascular prosthetic device, implant and graft, initial encounter: Principal | ICD-10-CM

## 2021-10-28 DIAGNOSIS — Z4502 Encounter for adjustment and management of automatic implantable cardiac defibrillator: Secondary | ICD-10-CM | POA: Diagnosis not present

## 2021-10-28 DIAGNOSIS — Z8261 Family history of arthritis: Secondary | ICD-10-CM

## 2021-10-28 DIAGNOSIS — T82198A Other mechanical complication of other cardiac electronic device, initial encounter: Secondary | ICD-10-CM

## 2021-10-28 DIAGNOSIS — T82847A Pain from cardiac prosthetic devices, implants and grafts, initial encounter: Secondary | ICD-10-CM | POA: Diagnosis not present

## 2021-10-28 DIAGNOSIS — I5022 Chronic systolic (congestive) heart failure: Secondary | ICD-10-CM | POA: Diagnosis present

## 2021-10-28 DIAGNOSIS — E781 Pure hyperglyceridemia: Secondary | ICD-10-CM | POA: Diagnosis present

## 2021-10-28 DIAGNOSIS — Z888 Allergy status to other drugs, medicaments and biological substances status: Secondary | ICD-10-CM

## 2021-10-28 DIAGNOSIS — M549 Dorsalgia, unspecified: Secondary | ICD-10-CM | POA: Diagnosis not present

## 2021-10-28 DIAGNOSIS — I959 Hypotension, unspecified: Secondary | ICD-10-CM | POA: Diagnosis present

## 2021-10-28 DIAGNOSIS — Z9049 Acquired absence of other specified parts of digestive tract: Secondary | ICD-10-CM

## 2021-10-28 DIAGNOSIS — R0789 Other chest pain: Secondary | ICD-10-CM | POA: Diagnosis not present

## 2021-10-28 DIAGNOSIS — E785 Hyperlipidemia, unspecified: Secondary | ICD-10-CM | POA: Diagnosis present

## 2021-10-28 DIAGNOSIS — Y838 Other surgical procedures as the cause of abnormal reaction of the patient, or of later complication, without mention of misadventure at the time of the procedure: Secondary | ICD-10-CM | POA: Diagnosis present

## 2021-10-28 DIAGNOSIS — Z7982 Long term (current) use of aspirin: Secondary | ICD-10-CM

## 2021-10-28 DIAGNOSIS — I447 Left bundle-branch block, unspecified: Secondary | ICD-10-CM | POA: Diagnosis present

## 2021-10-28 DIAGNOSIS — Z87891 Personal history of nicotine dependence: Secondary | ICD-10-CM

## 2021-10-28 DIAGNOSIS — M1990 Unspecified osteoarthritis, unspecified site: Secondary | ICD-10-CM | POA: Diagnosis present

## 2021-10-28 HISTORY — PX: LEAD REVISION/REPAIR: EP1213

## 2021-10-28 HISTORY — PX: PERICARDIOCENTESIS: CATH118255

## 2021-10-28 HISTORY — PX: CENTRAL LINE INSERTION: CATH118232

## 2021-10-28 LAB — CBC WITH DIFFERENTIAL/PLATELET
Abs Immature Granulocytes: 0.02 10*3/uL (ref 0.00–0.07)
Basophils Absolute: 0.1 10*3/uL (ref 0.0–0.1)
Basophils Relative: 1 %
Eosinophils Absolute: 0.3 10*3/uL (ref 0.0–0.5)
Eosinophils Relative: 4 %
HCT: 37.1 % — ABNORMAL LOW (ref 39.0–52.0)
Hemoglobin: 12.5 g/dL — ABNORMAL LOW (ref 13.0–17.0)
Immature Granulocytes: 0 %
Lymphocytes Relative: 24 %
Lymphs Abs: 1.9 10*3/uL (ref 0.7–4.0)
MCH: 30.9 pg (ref 26.0–34.0)
MCHC: 33.7 g/dL (ref 30.0–36.0)
MCV: 91.8 fL (ref 80.0–100.0)
Monocytes Absolute: 0.6 10*3/uL (ref 0.1–1.0)
Monocytes Relative: 8 %
Neutro Abs: 5.1 10*3/uL (ref 1.7–7.7)
Neutrophils Relative %: 63 %
Platelets: 134 10*3/uL — ABNORMAL LOW (ref 150–400)
RBC: 4.04 MIL/uL — ABNORMAL LOW (ref 4.22–5.81)
RDW: 13.2 % (ref 11.5–15.5)
WBC: 8 10*3/uL (ref 4.0–10.5)
nRBC: 0 % (ref 0.0–0.2)

## 2021-10-28 LAB — CBC
HCT: 33.4 % — ABNORMAL LOW (ref 39.0–52.0)
Hemoglobin: 11 g/dL — ABNORMAL LOW (ref 13.0–17.0)
MCH: 30.2 pg (ref 26.0–34.0)
MCHC: 32.9 g/dL (ref 30.0–36.0)
MCV: 91.8 fL (ref 80.0–100.0)
Platelets: 109 10*3/uL — ABNORMAL LOW (ref 150–400)
RBC: 3.64 MIL/uL — ABNORMAL LOW (ref 4.22–5.81)
RDW: 13.2 % (ref 11.5–15.5)
WBC: 8.6 10*3/uL (ref 4.0–10.5)
nRBC: 0 % (ref 0.0–0.2)

## 2021-10-28 LAB — SURGICAL PCR SCREEN
MRSA, PCR: NEGATIVE
Staphylococcus aureus: NEGATIVE

## 2021-10-28 LAB — ECHOCARDIOGRAM LIMITED: S' Lateral: 5.1 cm

## 2021-10-28 LAB — BASIC METABOLIC PANEL
Anion gap: 7 (ref 5–15)
BUN: 20 mg/dL (ref 8–23)
CO2: 22 mmol/L (ref 22–32)
Calcium: 8.8 mg/dL — ABNORMAL LOW (ref 8.9–10.3)
Chloride: 107 mmol/L (ref 98–111)
Creatinine, Ser: 1.02 mg/dL (ref 0.61–1.24)
GFR, Estimated: >60 mL/min (ref >60)
Glucose, Bld: 127 mg/dL — ABNORMAL HIGH (ref 70–99)
Potassium: 4.5 mmol/L (ref 3.5–5.1)
Sodium: 136 mmol/L (ref 135–145)

## 2021-10-28 LAB — ABO/RH: ABO/RH(D): A POS

## 2021-10-28 LAB — TROPONIN I (HIGH SENSITIVITY)
Troponin I (High Sensitivity): 154 ng/L (ref <18)
Troponin I (High Sensitivity): 135 ng/L (ref <18)

## 2021-10-28 LAB — PREPARE RBC (CROSSMATCH)

## 2021-10-28 SURGERY — LEAD REVISION/REPAIR

## 2021-10-28 MED ORDER — FLUMAZENIL 1 MG/10ML IV SOLN
INTRAVENOUS | Status: AC
  Filled 2021-10-28: qty 10

## 2021-10-28 MED ORDER — NOREPINEPHRINE BITARTRATE 1 MG/ML IV SOLN
INTRAVENOUS | Status: DC | PRN
  Administered 2021-10-28: 5 ug/min via INTRAVENOUS

## 2021-10-28 MED ORDER — SODIUM CHLORIDE 0.9 % IV SOLN
250.0000 mL | INTRAVENOUS | Status: DC
  Administered 2021-10-28: 250 mL via INTRAVENOUS

## 2021-10-28 MED ORDER — METOPROLOL TARTRATE 25 MG PO TABS
25.0000 mg | ORAL_TABLET | Freq: Once | ORAL | Status: DC
Start: 2021-10-29 — End: 2021-10-29

## 2021-10-28 MED ORDER — MORPHINE SULFATE (PF) 2 MG/ML IV SOLN
2.0000 mg | INTRAVENOUS | Status: DC | PRN
  Administered 2021-10-28: 2 mg via INTRAVENOUS
  Filled 2021-10-28: qty 1

## 2021-10-28 MED ORDER — LIDOCAINE HCL (PF) 1 % IJ SOLN
INTRAMUSCULAR | Status: DC | PRN
  Administered 2021-10-28: 50 mL

## 2021-10-28 MED ORDER — MIDAZOLAM HCL 5 MG/5ML IJ SOLN
INTRAMUSCULAR | Status: AC
  Filled 2021-10-28: qty 5

## 2021-10-28 MED ORDER — FLUMAZENIL 1 MG/10ML IV SOLN
INTRAVENOUS | Status: DC | PRN
  Administered 2021-10-28: .5 mg via INTRAVENOUS

## 2021-10-28 MED ORDER — FENTANYL CITRATE PF 50 MCG/ML IJ SOSY
PREFILLED_SYRINGE | INTRAMUSCULAR | Status: AC
  Administered 2021-10-28: 25 ug via INTRAVENOUS
  Filled 2021-10-28: qty 1

## 2021-10-28 MED ORDER — SODIUM CHLORIDE 0.9% FLUSH
3.0000 mL | Freq: Two times a day (BID) | INTRAVENOUS | Status: DC
  Administered 2021-10-28 – 2021-11-02 (×9): 3 mL via INTRAVENOUS

## 2021-10-28 MED ORDER — LIDOCAINE HCL (PF) 1 % IJ SOLN
INTRAMUSCULAR | Status: AC
  Filled 2021-10-28: qty 60

## 2021-10-28 MED ORDER — FENTANYL CITRATE (PF) 100 MCG/2ML IJ SOLN
INTRAMUSCULAR | Status: AC
  Filled 2021-10-28: qty 2

## 2021-10-28 MED ORDER — ORAL CARE MOUTH RINSE: 15.0000 mL | OROMUCOSAL | Status: DC | PRN

## 2021-10-28 MED ORDER — CEFAZOLIN SODIUM-DEXTROSE 2-4 GM/100ML-% IV SOLN
INTRAVENOUS | Status: AC
  Filled 2021-10-28: qty 100

## 2021-10-28 MED ORDER — ONDANSETRON HCL 4 MG/2ML IJ SOLN: 4.0000 mg | Freq: Four times a day (QID) | INTRAMUSCULAR | Status: DC | PRN

## 2021-10-28 MED ORDER — CEFAZOLIN SODIUM-DEXTROSE 2-4 GM/100ML-% IV SOLN
2.0000 g | INTRAVENOUS | Status: AC
  Administered 2021-10-28: 2 g via INTRAVENOUS
  Filled 2021-10-28: qty 100

## 2021-10-28 MED ORDER — FENTANYL CITRATE PF 50 MCG/ML IJ SOSY
50.0000 ug | PREFILLED_SYRINGE | Freq: Once | INTRAMUSCULAR | Status: AC
  Administered 2021-10-28: 50 ug via INTRAVENOUS
  Filled 2021-10-28: qty 1

## 2021-10-28 MED ORDER — HYDROMORPHONE HCL 1 MG/ML IJ SOLN
1.0000 mg | Freq: Once | INTRAMUSCULAR | Status: AC
  Administered 2021-10-28: 1 mg via INTRAVENOUS
  Filled 2021-10-28: qty 1

## 2021-10-28 MED ORDER — ACETAMINOPHEN 325 MG PO TABS
650.0000 mg | ORAL_TABLET | ORAL | Status: DC | PRN
  Administered 2021-10-28 – 2021-10-31 (×4): 650 mg via ORAL
  Filled 2021-10-28 (×5): qty 2

## 2021-10-28 MED ORDER — KETOROLAC TROMETHAMINE 15 MG/ML IJ SOLN
7.5000 mg | Freq: Once | INTRAMUSCULAR | Status: AC
  Administered 2021-10-28: 7.5 mg via INTRAVENOUS
  Filled 2021-10-28: qty 1

## 2021-10-28 MED ORDER — FENTANYL CITRATE PF 50 MCG/ML IJ SOSY
25.0000 ug | PREFILLED_SYRINGE | INTRAMUSCULAR | Status: DC | PRN
  Administered 2021-10-28 – 2021-10-29 (×9): 25 ug via INTRAVENOUS
  Filled 2021-10-28 (×11): qty 1

## 2021-10-28 MED ORDER — SODIUM CHLORIDE 0.9 % IV SOLN
INTRAVENOUS | Status: AC
  Filled 2021-10-28: qty 2

## 2021-10-28 MED ORDER — NOREPINEPHRINE BITARTRATE 1 MG/ML IV SOLN
INTRAVENOUS | Status: AC
  Filled 2021-10-28: qty 4

## 2021-10-28 MED ORDER — SODIUM CHLORIDE 0.9 % IV SOLN
80.0000 mg | INTRAVENOUS | Status: AC
  Administered 2021-10-28: 80 mg
  Filled 2021-10-28: qty 2

## 2021-10-28 MED ORDER — MIDAZOLAM HCL 5 MG/5ML IJ SOLN
INTRAMUSCULAR | Status: DC | PRN
  Administered 2021-10-28 (×4): 1 mg via INTRAVENOUS

## 2021-10-28 MED ORDER — HEPARIN (PORCINE) IN NACL 1000-0.9 UT/500ML-% IV SOLN
INTRAVENOUS | Status: DC | PRN
  Administered 2021-10-28: 500 mL

## 2021-10-28 MED ORDER — CEFAZOLIN SODIUM-DEXTROSE 1-4 GM/50ML-% IV SOLN
1.0000 g | Freq: Four times a day (QID) | INTRAVENOUS | Status: AC
  Administered 2021-10-28 – 2021-10-29 (×3): 1 g via INTRAVENOUS
  Filled 2021-10-28 (×3): qty 50

## 2021-10-28 MED ORDER — SODIUM CHLORIDE 0.9 % IV SOLN: INTRAVENOUS | Status: DC | PRN

## 2021-10-28 MED ORDER — SODIUM CHLORIDE 0.9 % IV SOLN: INTRAVENOUS | Status: DC

## 2021-10-28 MED ORDER — LIDOCAINE HCL (PF) 1 % IJ SOLN
INTRAMUSCULAR | Status: AC
  Filled 2021-10-28: qty 30

## 2021-10-28 MED ORDER — CHLORHEXIDINE GLUCONATE CLOTH 2 % EX PADS
6.0000 | MEDICATED_PAD | Freq: Every day | CUTANEOUS | Status: DC
  Administered 2021-10-28 – 2021-11-01 (×5): 6 via TOPICAL

## 2021-10-28 MED ORDER — FENTANYL CITRATE PF 50 MCG/ML IJ SOSY
25.0000 ug | PREFILLED_SYRINGE | Freq: Once | INTRAMUSCULAR | Status: AC
  Administered 2021-10-28: 25 ug via INTRAVENOUS
  Filled 2021-10-28: qty 1

## 2021-10-28 MED ORDER — FENTANYL CITRATE (PF) 100 MCG/2ML IJ SOLN
INTRAMUSCULAR | Status: DC | PRN
  Administered 2021-10-28: 50 ug via INTRAVENOUS
  Administered 2021-10-28 (×3): 25 ug via INTRAVENOUS

## 2021-10-28 SURGICAL SUPPLY — 9 items
CABLE SURGICAL S-101-97-12 (CABLE) ×3 IMPLANT
KIT CV 3L 7FR 20CM SULFAFREE (SET/KITS/TRAYS/PACK) ×1 IMPLANT
KIT WRENCH PACEMAKER ASSEM (MISCELLANEOUS) ×1 IMPLANT
PAD DEFIB RADIO PHYSIO CONN (PAD) ×3 IMPLANT
POUCH AIGIS-R ANTIBACT ICD (Mesh General) ×3 IMPLANT
POUCH AIGIS-R ANTIBACT ICD LRG (Mesh General) IMPLANT
TRAY PACEMAKER INSERTION (PACKS) ×3 IMPLANT
TRAY PERICARDIOCENTESIS 6FX60 (TRAY / TRAY PROCEDURE) ×1 IMPLANT
WIRE MICRO SET SILHO 5FR 7 (SHEATH) ×1 IMPLANT

## 2021-10-28 NOTE — ED Triage Notes (Signed)
Pt had ICD/pacemaker placed yesterday. Came in with shocking, intermittent chest pain. Pt appears in distress when it comes

## 2021-10-28 NOTE — ED Notes (Signed)
Taken to Cath lab by this RN. Consent given to RN in cath lab.

## 2021-10-28 NOTE — ED Notes (Signed)
No fax received yet from [facility]

## 2021-10-28 NOTE — Progress Notes (Signed)
  Echocardiogram 2D Echocardiogram has been performed.  [name] [name] 10/28/2021, 9:20 AM

## 2021-10-28 NOTE — H&P (Addendum)
Cardiology Admission History and Physical:   Patient ID: [name] [name] MRN: [id number]; DOB: 1946-01-20   Admission date: 10/28/2021  PCP:  [name] Agreste, MD   [facility] Providers Cardiologist:  [name] Hew, MD EP: Dr. Lovena [name]  Chief Complaint:  CP  Patient Profile:   [name] [name] is a 76 y.o. male with NICM, chronic CHF (systolic), LBBB who is being seen 10/28/2021 for the evaluation of CP post ICD implant.  History of Present Illness:   [name] [name] was here yesterday for CRT-D implant for his hx of NICM and LBBB, he underwent the procedure with no early or apparent complications and discharged home.  He felt well at home, went to bed as usual and early this AM he woke to get some water or use the restroom when he had sudden onset of sharp,terrible CP.  This was brief but markedly uncomfortable. This occurred another several times, once felt like a vice on his chest and they called 911. In route with bumps in the road/getting jostled around seemed to cause it as well.  Here laying still he is comfortable though deep inspiration does provoke it.  Current BP 135/72, SR 70's Device has been check by industry and RV ICD lead threshold markedly different/high from his post procedure check last night.  CXRs reviewed with Dr. Lovena [name], ICD lead tip does have a superior position, RA lead and LB leads have more slack though felt to look stable. They are otherwise without acute process, no ptx  LABS K+ 4.5 BUN/Creat 20/1.02 HS Trops 154, 135 WBC 8.0 H/H 12.5/37 Plts 134   Past Medical History:  Diagnosis Date   Arthritis    BPH (benign prostatic hypertrophy)    CHF (congestive heart failure) (HCC)    Elevated PSA    Eye problem 03/06/2016   Dr. Margaretmary [name] retinal detachment left eye, no sx done   History of gout    pt 05-20-2013 states stable    History of melanoma excision    2011-- LEFT UPPER BACK   Hyperlipemia    Hypertriglyceridemia    Left bundle  branch block 08/2017   Concern for LBBB mediated cardiomyopathy   Melanoma ([place]) 2011   back   Nonischemic cardiomyopathy ([place]) 09/2020   08/2009: EF 40-45%, Global HK-> 09/2017 -> EF 45-50% ; 3/'22: EF<20%.  Elevated LVEDP.;  09/2020: EF 20-25%.  GR 1 DD   Numbness    hands   Personal history of arthritis    Personal history of other diseases of circulatory system    Pre-diabetes     Past Surgical History:  Procedure Laterality Date   ANTERIOR CERVICAL DECOMP/DISCECTOMY FUSION N/A 04/20/2018   Procedure: Anterior Cervical Decompression Fusion - Cervical Three-Cervical Four - Cervical Four-Cervical Five - Cervical Five-Cervical Six;  Surgeon: [name] Moore, MD;  Location: [place];  Service: Neurosurgery;  Laterality: N/A;  Anterior Cervical Decompression Fusion - Cervical Three-Cervical Four - Cervical Four-Cervical Five - Cervical Five-Cervical Six   APPENDECTOMY  AS CHILD   BACK SURGERY     BIV ICD INSERTION CRT-D N/A 10/27/2021   Procedure: BIV ICD INSERTION CRT-D;  Surgeon: [name] Lance, MD;  Location: [place] [facility];  Service: Cardiovascular;  Laterality: N/A;   CARDIOVASCULAR STRESS TEST  10/05/1998   MILD GLOBAL HYPOKINESIS AND ISCHEMIAIN ANTEROSEPTAL  AT [facility]/ EF 42%   CARPAL TUNNEL RELEASE Right    EXCISION MELANOMA LEFT UPPER BACK  10/14/2009   eye lid surgery Bilateral 2018  GOLD SEED IMPLANT N/A 07/23/2021   Procedure: GOLD SEED IMPLANT;  Surgeon: [name] Hughs, MD;  Location: [facility];  Service: Urology;  Laterality: N/A;   KNEE ARTHROSCOPY WITH SUBCHONDROPLASTY Left 04/07/2017   Procedure: LEFT KNEE ARTHROSCOPY WITH PARTIAL MEDIAL MENISCECTOMY AND MEDIAL TIBIAL SUBCHONDROPLASTY;  Surgeon: [name] Rossetti, MD;  Location: [facility];  Service: Orthopedics;  Laterality: Left;   knee injections Bilateral    LEFT HEART CATH AND CORONARY ANGIOGRAPHY  04/2002   minimal irregularities in the LAD/  EF 50%  -   DR AL [name]   LUMBAR DISC SURGERY  09/12/2000   left  L5  -- S1   PROSTATE BIOPSY N/A 05/22/2013   Procedure: BIOPSY TRANSRECTAL ULTRASONIC PROSTATE (TUBP);  Surgeon: [name] Amass, MD;  Location: [facility];  Service: Urology;  Laterality: N/A;   RIGHT/LEFT HEART CATH AND CORONARY ANGIOGRAPHY N/A 06/25/2020   Procedure: RIGHT/LEFT HEART CATH AND CORONARY ANGIOGRAPHY;  Surgeon: [name] Crome, MD;  Location: [place] [facility];:; Normal coronary arteries; LVEDP 23 mmHg; PCWP 19 mmHg.   ROTATOR CUFF REPAIR Right 2016   SPACE OAR INSTILLATION N/A 07/23/2021   Procedure: SPACE OAR INSTILLATION;  Surgeon: [name] Hughs, MD;  Location: [facility];  Service: Urology;  Laterality: N/A;   THROAT SURGERY  04/20/2018   TRANSRECTAL ULTRASOUND PROSTATE BX  05-23-2005  &  04-19-2001   TRANSTHORACIC ECHOCARDIOGRAM  09/2017   EF 45-50 % (previously reported as 40 to 45%).  Incoordinate septal motion with mild LVH.  GR 1 DD.  Aortic sclerosis but no stenosis.   TRANSTHORACIC ECHOCARDIOGRAM  10/06/2020   Severely reduced EF of 20 to 25%.  No LV thrombus.  Severe septal-lateral wall systolic dyssynchrony due to LBBB.  Global HK.  Moderately dilated LV.  GR 1 DD with mild LA dilation..  Unable to assess PAP with normal RV size and function.  Normal RAP.  Mild AOV sclerosis but no stenosis.  Mild to moderate MR.   TRANSTHORACIC ECHOCARDIOGRAM  06/16/2020   Severely reduced EF <20%.  Moderate severely dilated LV.  GR 2 DD.  Elevated LVEDP.  Mild LA dilation.  Mildly reduced RV function.  Mild MR.  Mild aortic valve calcification     Medications Prior to Admission: Prior to Admission medications   Medication Sig Start Date End Date Taking? Authorizing Provider  acetaminophen (TYLENOL) 500 MG tablet Take 1,000 mg by mouth every 6 (six) hours as needed for moderate pain.    [provider]  aspirin EC 81 MG tablet Take 1 tablet (81 mg total) by mouth daily. 04/26/18   [name], [name] Mink, PA-C  carvedilol (COREG) 3.125 MG tablet TAKE 1 TABLET  (3.125 MG TOTAL) BY MOUTH 2 (TWO) TIMES DAILY WITH A MEAL. 04/28/21   [name], [name] Ng, NP  colchicine 0.6 MG tablet 2 pills initially with flare, then up to BID for few days if needed. Patient taking differently: Take 0.6 mg by mouth as needed (gout flare). 02/26/21   [name] Agreste, MD  finasteride (PROSCAR) 5 MG tablet Take 5 mg by mouth daily. 05/18/21   [provider]  furosemide (LASIX) 40 MG tablet TAKE 1 TABLET TWICE WEEKLY AS NEEDED AS DIRECTED Patient taking differently: Take 40 mg by mouth 2 (two) times a week. 07/01/21   Deberah Pelton, NP  gabapentin (NEURONTIN) 300 MG capsule Take 1-2 capsules (300-600 mg total) by mouth 3 (three) times daily as needed (pain). Patient taking differently: Take 300  mg by mouth See admin instructions. Take 300 mg twice daily, may increase to 600 mg twice daily as needed for pain 02/26/21   [name] Agreste, MD  hydrALAZINE (APRESOLINE) 25 MG tablet Take 1 tablet (25 mg total) by mouth in the morning and at bedtime. 07/13/21   Leonie Man, MD  magnesium oxide (MAG-OX) 400 MG tablet Take 400 mg by mouth daily.    [provider]  Multiple Vitamin (MULTIVITAMIN WITH MINERALS) TABS tablet Take 1 tablet by mouth daily. One-A-Day Multivitamin    [provider]  naproxen sodium (ALEVE) 220 MG tablet Take 440 mg by mouth daily as needed (pain).    [provider]  Omega-3 Fatty Acids (FISH OIL) 500 MG CAPS Take 500 mg by mouth daily.    [provider]  rosuvastatin (CRESTOR) 10 MG tablet Take 1 tablet (10 mg total) by mouth daily. 02/26/21 10/21/21  [name] Agreste, MD  sildenafil (VIAGRA) 100 MG tablet 1/2 to 1 tablet up to each day as needed. 07/08/11   [name] Agreste, MD  tamsulosin (FLOMAX) 0.4 MG CAPS capsule TAKE 1 CAPSULE EVERY DAY 08/03/21   [name] Agreste, MD  Turmeric 500 MG TABS Take 500 mg by mouth daily.    [provider]  vitamin B-12 (CYANOCOBALAMIN) 500 MCG tablet Take 500 mcg by  mouth daily.    [provider]  Zinc 50 MG TABS Take 50 mg by mouth daily.    [provider]     Allergies:    Allergies  Allergen Reactions   Nitroglycerin Anaphylaxis    "Cardiologist suggested he shouldn't take this med because it could kill him with his heart condition. 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  Tobacco Use   Smoking status: Former    Packs/day: 2.00    Years: 20.00    Total pack years: 40.00    Types: Cigarettes    Quit date: 03/28/1980    Years since quitting: 41.6   Smokeless tobacco: Never  Vaping Use   Vaping Use: Never used  Substance and Sexual Activity   Alcohol use: No    Alcohol/week: 0.0 standard drinks of alcohol   Drug use: No   Sexual activity: Not on file  Other Topics Concern   Not on file  Social History Narrative   He walks on a relatively regular basis about 15 minutes at a time mostly to the discomfort. He previously had walked up a 30-40 minutes a time without problems. Education: [employer].   Lives alone.   Right-handed.   One cup coffee daily.   Social Determinants of Health   Financial Resource Strain: Not on file  Food Insecurity: Not on file  Transportation Needs: Not on file  Physical Activity: Not on file  Stress: Not on file  Social Connections: Not on file  Intimate Partner Violence: Not on file    Family History:   The patient's family history includes Arthritis in his mother; Breast  cancer in his sister; COPD in his father; Cancer in his sister;  Cardiomyopathy in an other family member; Lung cancer in his father and sister. There is no history of Colon cancer.    ROS:  Please see the history of present illness.  All other ROS reviewed and negative.     Physical Exam/Data:   Vitals:   10/28/21 0700 10/28/21 0730 10/28/21 0745 10/28/21 0749  BP: 109/61 92/64 135/72   Pulse: 73 72 77   Resp: '15 11 15   '$ Temp:    98 F (36.7 C)  TempSrc:    Oral  SpO2: 98% 98% 99%    No intake or output data in the 24 hours ending 10/28/21 0826    10/27/2021    8:21 AM 10/01/2021    8:51 AM 09/22/2021    8:34 AM  Last 3 Weights  Weight (lbs) 179 lb 185 lb 184 lb  Weight (kg) 81.194 kg 83.915 kg 83.462 kg     There is no height or weight on file to calculate BMI.  General:  Well nourished, well developed, in no acute distress HEENT: normal Neck: no JVD Vascular: No carotid bruits; Distal pulses 2+ bilaterally   Cardiac:  RRR; no murmurs, gallops or rubs, heart sounds are not distant Lungs: CTA b/l, no wheezing, rhonchi or rales  Abd: soft, nontender, no hepatomegaly  Ext: no edema Musculoskeletal:  No deformities, BUE and BLE strength normal and equal Skin: warm and dry  Neuro:  CNs 2-12 intact, no focal abnormalities noted Psych:  Normal affect    EKG:  The ECG that was done today was personally reviewed and demonstrates SR/V paced, QRS 137m  Relevant CV Studies:  10/06/20: TTE  1. There is no left ventricular thrombus. There is severe septal-lateral  wall systolic dyssynchrony due to LBBB. Left ventricular ejection  fraction, by estimation, is 20 to 25%. The left ventricle has severely  decreased function. The left ventricle  demonstrates global hypokinesis. The left ventricular internal cavity size  was moderately dilated. Left ventricular diastolic parameters are  consistent with Grade I diastolic dysfunction (impaired relaxation).   2. Right ventricular systolic function is normal. The right ventricular  size is normal.  Tricuspid regurgitation signal is inadequate for assessing  PA pressure.   3. Left atrial size was mildly dilated.   4. The mitral valve is normal in structure. Mild to moderate mitral valve  regurgitation.   5. The aortic valve is tricuspid. There is mild calcification of the  aortic valve. There is mild thickening of the aortic valve. Aortic valve  regurgitation is not visualized. Mild aortic valve sclerosis is present,  with no evidence of aortic valve  stenosis.   6. The inferior vena cava is normal in size with greater than 50%  respiratory variability, suggesting right atrial pressure of 3 mmHg.   Comparison(s): The left ventricular function is unchanged. The left  ventricular diastolic function has improved. 06/16/20 EF <20%.   Laboratory Data:  High Sensitivity Troponin:   Recent Labs  Lab 10/28/21 0448 10/28/21 0636  TROPONINIHS 154* 135*      Chemistry Recent Labs  Lab 10/28/21 0448  NA 136  K 4.5  CL 107  CO2 22  GLUCOSE 127*  BUN 20  CREATININE 1.02  CALCIUM 8.8*  GFRNONAA >60  ANIONGAP 7    No results for input(s): "PROT", "ALBUMIN", "AST", "ALT", "ALKPHOS", "BILITOT" in the last 168 hours. Lipids No results for input(s): "CHOL", "TRIG", "HDL", "LABVLDL", "  [place]", "CHOLHDL" in the last 168 hours. Hematology Recent Labs  Lab 10/28/21 0448  WBC 8.0  RBC 4.04*  HGB 12.5*  HCT 37.1*  MCV 91.8  MCH 30.9  MCHC 33.7  RDW 13.2  PLT 134*   Thyroid No results for input(s): "TSH", "FREET4" in the last 168 hours. BNPNo results for input(s): "BNP", "PROBNP" in the last 168 hours.  DDimer No results for input(s): "DDIMER" in the last 168 hours.   Radiology/Studies:  DG Chest Port 1 View Result Date: 10/28/2021 CLINICAL DATA:  Severe chest pain EXAM: PORTABLE CHEST 1 VIEW COMPARISON:  Yesterday FINDINGS: Normal heart size and mediastinal contours. Dual-chamber pacer leads from the left with stable positioning. Artifact from EKG leads. There is no edema,  consolidation, effusion, or pneumothorax. IMPRESSION: No evidence of active disease. Electronically Signed   By: Jorje Guild M.D.   On: 10/28/2021 05:03    Assessment and Plan:   CP Suspect microperforation from ICD lead Reproducible with deep inspiration None otherwise He is hemodynamically stable currently  Dr. Lovena [name] has been to see the patient Discussed at length with the patient and wife findings and need for lead revision And plan for Dr. Curt Bears to do his procedure this afternoon.  OK for breakfast, then NPO  NICM Chronic CHF Appears compensated currently Hold meds for now until post procedure  Admit to tele/obs   Risk Assessment/Risk Scores:    For questions or updates, please contact [facility] Please consult [email or web address] for contact info under     Signed, Baldwin Jamaica, PA-C  10/28/2021 8:26 AM

## 2021-10-28 NOTE — ED Notes (Signed)
Pt interrogated with Therapist, music. Contacted support service [phone], but no answer.

## 2021-10-28 NOTE — ED Notes (Signed)
[employer] was called to inquire about ICD/pacemaker report. Notified that they will call the rep for this area and call us back

## 2021-10-28 NOTE — Interval H&P Note (Signed)
History and Physical Interval Note:  10/28/2021 2:13 PM  [name] [name]  has presented today for surgery, with the diagnosis of micro perforation.  The various methods of treatment have been discussed with the patient and family. After consideration of risks, benefits and other options for treatment, the patient has consented to  Procedure(s): LEAD REVISION/REPAIR (N/A) as a surgical intervention.  The patient's history has been reviewed, patient examined, no change in status, stable for surgery.  I have reviewed the patient's chart and labs.  Questions were answered to the patient's satisfaction.     Ardell Makarewicz [employer]

## 2021-10-28 NOTE — ED Notes (Signed)
[employer] rep at bedside to interrogate pacemaker

## 2021-10-28 NOTE — Op Note (Signed)
Procedure: Pericardiocentesis guided by fluoroscopy and echo, insertion of right internal jugular triple-lumen catheter for central venous access  Indication: Cardiac tamponade  Operator: Dr. Burt Knack Assisting: Dr. Curt Bears  I was called to the electrophysiology lab after the patient developed severe chest and back pain during a lead revision.  He had a pacemaker placed yesterday and is felt to have a microperforation with small pericardial effusion, presenting today for lead revision.  He became hypotensive after developing acute chest and back discomfort.  We had a high suspicion for hemopericardium with tamponade.  The subxiphoid area is prepped, draped, and anesthetized with 1% lidocaine.  Echo is called for a stat limited study to confirm pericardial effusion and this study in fact shows increase in size of the pericardial effusion with RV collapse.  Using an LP needle with stylette, the needle is positioned just beneath the rib cage in the subxiphoid space and then infiltrated with lidocaine.  They pericardial space is accessed via a single puncture and a wire is advanced crossing the midline in a typical pattern seen with entry into the pericardium.  The tract is dilated and a pericardial drain is placed.  Approximately 200 mL of bloody fluid is removed and hooked up to suction.  The patient's hemodynamics improved immediately.  The pericardial drain is secured in place.  Attention is then turned to the right neck where it is prepped, draped, and anesthetized with 1% lidocaine.  Using vascular ultrasound guidance and a micropuncture technique, the right internal jugular vein is accessed, dilated, and a triple-lumen catheter is inserted.  Position is confirmed by fluoroscopy.  All ports were flushed and the catheter is sutured in place.  Conclusion: 1.  Successful pericardiocentesis 2.  Placement of right internal jugular central line via ultrasound guidance  Complications: no immediate  EBL:  minimal  Sherren Mocha 10/28/2021 4:20 PM

## 2021-10-28 NOTE — Consult Note (Signed)
Patient presenting to the emergency department with sharp shocking sensations in his mid chest since 3 AM.  He had a biventricular ICD placed with Dr. Lovena Le on 8/2.  He does not feel like the pain is over the pocket it feels that it is in his heart.  During my examination of him, he had one of the episodes and it makes him pause talking and have a jumping sensation.  Otherwise, he is hemodynamically stable.  His labs are unremarkable except for an initial troponin that was 154, which I believe is likely related to what ever is going on plus recent device implantation.  I have let the day cardiology team know about the situation for this patient.  Unfortunately, interrogation report is still pending.  So we will have our EP team see him shortly.  Billey Chang MD Cardiology

## 2021-10-28 NOTE — ED Provider Notes (Signed)
[facility] EMERGENCY DEPARTMENT Provider Note   CSN: [id number] Arrival date & time: 10/28/21  0441     History  Chief Complaint  Patient presents with   Chest Pain    [name] [name] is a 76 y.o. male.  The history is provided by the patient.  Chest Pain Pain location:  L chest Pain quality: sharp   Pain severity:  Severe Onset quality:  Gradual Timing:  Intermittent Progression:  Worsening Chronicity:  New Associated symptoms: no shortness of breath   Patient with history of chronic systolic heart failure presents with left-sided sharp chest pain started tonight.  He reports he just had a ICD/pacemaker placed. He reports the pain is around the site of the pacemaker     Home Medications Prior to Admission medications   Medication Sig Start Date End Date Taking? Authorizing Provider  acetaminophen (TYLENOL) 500 MG tablet Take 1,000 mg by mouth every 6 (six) hours as needed for moderate pain.    [provider]  aspirin EC 81 MG tablet Take 1 tablet (81 mg total) by mouth daily. 04/26/18   Costella, Vista Mink, PA-C  carvedilol (COREG) 3.125 MG tablet TAKE 1 TABLET (3.125 MG TOTAL) BY MOUTH 2 (TWO) TIMES DAILY WITH A MEAL. 04/28/21   Cleaver, Jossie Ng, NP  colchicine 0.6 MG tablet 2 pills initially with flare, then up to BID for few days if needed. Patient taking differently: Take 0.6 mg by mouth as needed (gout flare). 02/26/21   [name], MD  finasteride (PROSCAR) 5 MG tablet Take 5 mg by mouth daily. 05/18/21   [provider]  furosemide (LASIX) 40 MG tablet TAKE 1 TABLET TWICE WEEKLY AS NEEDED AS DIRECTED Patient taking differently: Take 40 mg by mouth 2 (two) times a week. 07/01/21   Deberah Pelton, NP  gabapentin (NEURONTIN) 300 MG capsule Take 1-2 capsules (300-600 mg total) by mouth 3 (three) times daily as needed (pain). Patient taking differently: Take 300 mg by mouth See admin instructions. Take 300 mg twice daily, may  increase to 600 mg twice daily as needed for pain 02/26/21   [name], MD  hydrALAZINE (APRESOLINE) 25 MG tablet Take 1 tablet (25 mg total) by mouth in the morning and at bedtime. 07/13/21   Leonie Man, MD  magnesium oxide (MAG-OX) 400 MG tablet Take 400 mg by mouth daily.    [provider]  Multiple Vitamin (MULTIVITAMIN WITH MINERALS) TABS tablet Take 1 tablet by mouth daily. One-A-Day Multivitamin    [provider]  naproxen sodium (ALEVE) 220 MG tablet Take 440 mg by mouth daily as needed (pain).    [provider]  Omega-3 Fatty Acids (FISH OIL) 500 MG CAPS Take 500 mg by mouth daily.    [provider]  rosuvastatin (CRESTOR) 10 MG tablet Take 1 tablet (10 mg total) by mouth daily. 02/26/21 10/21/21  [name], MD  sildenafil (VIAGRA) 100 MG tablet 1/2 to 1 tablet up to each day as needed. 07/08/11   [name], MD  tamsulosin (FLOMAX) 0.4 MG CAPS capsule TAKE 1 CAPSULE EVERY DAY 08/03/21   [name], MD  Turmeric 500 MG TABS Take 500 mg by mouth daily.    [provider]  vitamin B-12 (CYANOCOBALAMIN) 500 MCG tablet Take 500 mcg by mouth daily.    [provider]  Zinc 50 MG TABS Take 50 mg by mouth daily.    [provider]  Allergies    Nitroglycerin, Crestor [rosuvastatin], Ace inhibitors, Lovastatin, and Solarcaine [benzocaine]    Review of Systems   Review of Systems  Respiratory:  Negative for shortness of breath.   Cardiovascular:  Positive for chest pain.    Physical Exam Updated Vital Signs BP (!) 98/46   Pulse 71   Temp 98 F (36.7 C) (Oral)   Resp 15   SpO2 96%  Physical Exam CONSTITUTIONAL: Elderly, uncomfortable appearing HEAD: Normocephalic/atraumatic EYES: EOMI ENMT: Mucous membranes moist NECK: supple no meningeal signs CV: S1/S2 noted, no murmurs/rubs/gallops noted LUNGS: Lungs are clear to auscultation bilaterally, no apparent distress ABDOMEN:  soft NEURO: Pt is awake/alert/appropriate, moves all extremitiesx4.  No facial droop.   EXTREMITIES: pulses normal/equal, full ROM SKIN: Bandage noted over the pacemaker site there is no fluctuance or bleeding noted PSYCH: Anxious  ED Results / Procedures / Treatments   Labs (all labs ordered are listed, but only abnormal results are displayed) Labs Reviewed  CBC WITH DIFFERENTIAL/PLATELET - Abnormal; Notable for the following components:      Result Value   RBC 4.04 (*)    Hemoglobin 12.5 (*)    HCT 37.1 (*)    Platelets 134 (*)    All other components within normal limits  BASIC METABOLIC PANEL - Abnormal; Notable for the following components:   Glucose, Bld 127 (*)    Calcium 8.8 (*)    All other components within normal limits  TROPONIN I (HIGH SENSITIVITY) - Abnormal; Notable for the following components:   Troponin I (High Sensitivity) 154 (*)    All other components within normal limits  TROPONIN I (HIGH SENSITIVITY)    EKG ED ECG REPORT   Date: 10/28/2021 0434  Rate: 70  Rhythm:  paced rhythm  QRS Axis: left  Intervals: normal  ST/T Wave abnormalities: nonspecific ST changes  Conduction Disutrbances:left bundle branch block  Narrative Interpretation:   Old EKG Reviewed: unchanged  I have personally reviewed the EKG tracing and agree with the computerized printout as noted.   Radiology DG Chest Port 1 View  Result Date: 10/28/2021 CLINICAL DATA:  Severe chest pain EXAM: PORTABLE CHEST 1 VIEW COMPARISON:  Yesterday FINDINGS: Normal heart size and mediastinal contours. Dual-chamber pacer leads from the left with stable positioning. Artifact from EKG leads. There is no edema, consolidation, effusion, or pneumothorax. IMPRESSION: No evidence of active disease. Electronically Signed   By: Jorje Guild M.D.   On: 10/28/2021 05:03   EP PPM/ICD IMPLANT  Result Date: 10/27/2021 Conclusion: Successful insertion of a [employer] biventricular ICD in a patient with  chronic systolic heart failure, left bundle branch block, ejection fraction 20% despite maximal medical therapy. Cristopher Peru, MD   DG Chest 2 View  Result Date: 10/27/2021 CLINICAL DATA:  Post pacemaker insertion. EXAM: CHEST - 2 VIEW COMPARISON:  May 15, 2020 FINDINGS: Since the prior study there is been interval placement of a multi lead AICD. Appropriate lead wire positioning is noted. The heart size and mediastinal contours are within normal limits. Both lungs are clear. A radiopaque fusion plate and screws are seen overlying the lower cervical spine. The visualized skeletal structures are otherwise unremarkable. IMPRESSION: No active cardiopulmonary disease. Electronically Signed   By: Virgina Norfolk M.D.   On: 10/27/2021 17:49    Procedures Procedures    Medications Ordered in ED Medications  fentaNYL (SUBLIMAZE) injection 25 mcg (has no administration in time range)  fentaNYL (SUBLIMAZE) injection 50 mcg (50 mcg Intravenous Given 10/28/21 0523)  ED Course/ Medical Decision Making/ A&P Clinical Course as of 10/28/21 0706  Thu Oct 28, 2021  0513 Discussed with cardiology who will see the patient [DW]  [phone] Patient seen by cardiology fellow.  Unfortunately we have not received interrogation report.  Patient overall appears stable at this time.  He will be seen by electrophysiology to [DW]  0706 Signed out to Dr. Wyvonnia Dusky at shift change [DW]    Clinical Course User Index [DW] Ripley Fraise, MD                           Medical Decision Making Amount and/or Complexity of Data Reviewed Labs: ordered. Radiology: ordered.  Risk Prescription drug management.   This patient presents to the ED for concern of chest pain, this involves an extensive number of treatment options, and is a complaint that carries with it a high risk of complications and morbidity.  The differential diagnosis includes but is not limited to acute coronary syndrome, aortic dissection, pulmonary  embolism, pericarditis, pneumothorax, pneumonia, myocarditis, pleurisy, esophageal rupture, ICD malfunction, pericardial effusion    Comorbidities that complicate the patient evaluation: Patient's presentation is complicated by their history of CHF and recent ICD placed  Additional history obtained: Records reviewed previous admission documents  Lab Tests: I Ordered, and personally interpreted labs.  The pertinent results include:  mildly elevated troponin  Imaging Studies ordered: I ordered imaging studies including X-ray chest   I independently visualized and interpreted imaging which showed no acute findings I agree with the radiologist interpretation  Cardiac Monitoring: The patient was maintained on a cardiac monitor.  I personally viewed and interpreted the cardiac monitor which showed an underlying rhythm of:   ?paced rhythm  Medicines ordered and prescription drug management: I ordered medication including fentanyl  for pain  Reevaluation of the patient after these medicines showed that the patient    improved   Consultations Obtained: I requested consultation with the consultant cardiology dr Marcelle Smiling , and discussed  findings as well as pertinent plan - they recommend: will need EP consult, awaiting interrogation report  Reevaluation: After the interventions noted above, I reevaluated the patient and found that they have :improved  Complexity of problems addressed: Patient's presentation is most consistent with  acute presentation with potential threat to life or bodily function          Final Clinical Impression(s) / ED Diagnoses Final diagnoses:  Complication due to implantable cardioverter-defibrillator (ICD), initial encounter    Rx / DC Orders ED Discharge Orders     None         Ripley Fraise, MD 10/28/21 [phone]

## 2021-10-28 NOTE — ED Notes (Signed)
Pt signed both paper and e-consent.

## 2021-10-28 NOTE — H&P (Addendum)
[name] Jamaica, PA-C  Physician Assistant Certified Electrophysiology H&P    Cosign Needed Addendum Date of Service:  10/28/2021  8:25 AM  Related encounter: ED from 10/28/2021 in [place] [facility]                                                                                                                                                                                                                                                                                                                                                                                                                                          Cardiology Admission History and Physical:    Patient ID: [name] [name] MRN: [id number]; DOB: 11/01/1945    Admission date: 10/28/2021   PCP:  [name] Agreste, MD              [name] [name]:  [name] Hew, MD [name]: [name] [name]   Chief Complaint:  CP   Patient Profile:    [name] [name] is a 76 y.o. male with NICM, chronic CHF (systolic), LBBB who is being seen 10/28/2021 for the evaluation of CP post ICD implant.   History of Present Illness:    [name] [name] was here yesterday for CRT-D implant for his hx of NICM and LBBB, he underwent the procedure with no early or apparent complications and discharged home.  He felt well at home, went to bed as usual and early this AM he woke to get some water or use the restroom when he had sudden onset of sharp,terrible CP.  This was brief but markedly uncomfortable. This occurred another several times, once felt like a vice on his chest and they called 911. In route with bumps in the road/getting jostled around seemed to cause it as well.   Here laying still he is comfortable though deep inspiration does  provoke it.   Current BP 135/72, SR 70's Device has been check by industry and RV ICD lead threshold markedly different/high from his post procedure check last night.   CXRs reviewed with [name] [name], ICD lead tip does have a superior position, RA lead and LB leads have more slack though felt to look stable. They are otherwise without acute process, no ptx   LABS K+ 4.5 BUN/Creat 20/1.02 HS Trops 154, 135 WBC 8.0 H/H 12.5/37 Plts 134         Past Medical History:  Diagnosis Date   Arthritis     BPH (benign prostatic hypertrophy)     CHF (congestive heart failure) (HCC)     Elevated PSA     Eye problem 03/06/2016    [name] [name] retinal detachment left eye, no sx done   History of gout      pt 05-20-2013 states stable    History of melanoma excision      2011-- LEFT UPPER BACK   Hyperlipemia     Hypertriglyceridemia     Left bundle branch block 08/2017    Concern for LBBB mediated cardiomyopathy   Melanoma ([place]) 2011    back   Nonischemic cardiomyopathy ([place]) 09/2020    08/2009: EF 40-45%, Global HK-> 09/2017 -> EF 45-50% ; 3/'22: EF<20%.  Elevated LVEDP.;  09/2020: EF 20-25%.  GR 1 DD   Numbness      hands   Personal history of arthritis     Personal history of other diseases of circulatory system     Pre-diabetes             Past Surgical History:  Procedure Laterality Date   ANTERIOR CERVICAL DECOMP/DISCECTOMY FUSION N/A 04/20/2018    Procedure: Anterior Cervical Decompression Fusion - Cervical Three-Cervical Four - Cervical Four-Cervical Five - Cervical Five-Cervical Six;  Surgeon: [name] Moore, MD;  Location: [facility];  Service: Neurosurgery;  Laterality: N/A;  Anterior Cervical Decompression Fusion - Cervical Three-Cervical Four - Cervical Four-Cervical Five - Cervical Five-Cervical Six   APPENDECTOMY   AS CHILD   BACK SURGERY       BIV ICD INSERTION CRT-D N/A 10/27/2021    Procedure: BIV ICD INSERTION CRT-D;  Surgeon: [name] Lance, MD;  Location: [facility];  Service: Cardiovascular;  Laterality: N/A;   CARDIOVASCULAR STRESS TEST   10/05/1998    MILD GLOBAL HYPOKINESIS AND ISCHEMIAIN ANTEROSEPTAL  AT [facility]/ EF 42%   CARPAL TUNNEL RELEASE Right     EXCISION MELANOMA LEFT UPPER BACK   10/14/2009   eye lid surgery Bilateral 2018   GOLD SEED IMPLANT N/A 07/23/2021    Procedure: GOLD SEED IMPLANT;  Surgeon: [name] Hughs, MD;  Location: [facility];  Service: Urology;  Laterality: N/A;   KNEE ARTHROSCOPY WITH SUBCHONDROPLASTY Left 04/07/2017    Procedure: LEFT KNEE ARTHROSCOPY WITH PARTIAL MEDIAL MENISCECTOMY AND MEDIAL TIBIAL SUBCHONDROPLASTY;  Surgeon: [name] Rossetti, MD;  Location: [facility];  Service: Orthopedics;  Laterality: Left;  knee injections Bilateral     LEFT HEART CATH AND CORONARY ANGIOGRAPHY   04/2002    minimal irregularities in the LAD/  EF 50%  -   [name] [name]   LUMBAR DISC SURGERY   09/12/2000    left  L5 -- S1   PROSTATE BIOPSY N/A 05/22/2013    Procedure: BIOPSY TRANSRECTAL ULTRASONIC PROSTATE (TUBP);  Surgeon: [name] Amass, MD;  Location[facility];  Service: Urology;  Laterality: N/A;   RIGHT/LEFT HEART CATH AND CORONARY ANGIOGRAPHY N/A 06/25/2020    Procedure: RIGHT/LEFT HEART CATH AND CORONARY ANGIOGRAPHY;  Surgeon: [name] Crome, MD;  Location: [facility];:; Normal coronary arteries; LVEDP 23 mmHg; PCWP 19 mmHg.   ROTATOR CUFF REPAIR Right 2016   SPACE OAR INSTILLATION N/A 07/23/2021    Procedure: SPACE OAR INSTILLATION;  Surgeon: [name] Hughs, MD;  Location: [facility];  Service: Urology;  Laterality: N/A;   THROAT SURGERY   04/20/2018   TRANSRECTAL ULTRASOUND PROSTATE BX   05-23-2005  &  04-19-2001   TRANSTHORACIC ECHOCARDIOGRAM   09/2017    EF 45-50 % (previously reported as 40 to 45%).  Incoordinate septal motion with mild LVH.  GR 1 DD.  Aortic sclerosis but no stenosis.   TRANSTHORACIC ECHOCARDIOGRAM   10/06/2020    Severely reduced EF of 20 to 25%.  No LV thrombus.   Severe septal-lateral wall systolic dyssynchrony due to LBBB.  Global HK.  Moderately dilated LV.  GR 1 DD with mild LA dilation..  Unable to assess PAP with normal RV size and function.  Normal RAP.  Mild AOV sclerosis but no stenosis.  Mild to moderate MR.   TRANSTHORACIC ECHOCARDIOGRAM   06/16/2020    Severely reduced EF <20%.  Moderate severely dilated LV.  GR 2 DD.  Elevated LVEDP.  Mild LA dilation.  Mildly reduced RV function.  Mild MR.  Mild aortic valve calcification      Medications Prior to Admission:        Prior to Admission medications   Medication Sig Start Date End Date Taking? Authorizing Provider  acetaminophen (TYLENOL) 500 MG tablet Take 1,000 mg by mouth every 6 (six) hours as needed for moderate pain.       [provider]  aspirin EC 81 MG tablet Take 1 tablet (81 mg total) by mouth daily. 04/26/18     Costella, Vista Mink, PA-C  carvedilol (COREG) 3.125 MG tablet TAKE 1 TABLET (3.125 MG TOTAL) BY MOUTH 2 (TWO) TIMES DAILY WITH A MEAL. 04/28/21     Cleaver, Jossie Ng, NP  colchicine 0.6 MG tablet 2 pills initially with flare, then up to BID for few days if needed. Patient taking differently: Take 0.6 mg by mouth as needed (gout flare). 02/26/21     [name] Agreste, MD  finasteride (PROSCAR) 5 MG tablet Take 5 mg by mouth daily. 05/18/21     [provider]  furosemide (LASIX) 40 MG tablet TAKE 1 TABLET TWICE WEEKLY AS NEEDED AS DIRECTED Patient taking differently: Take 40 mg by mouth 2 (two) times a week. 07/01/21     Deberah Pelton, NP  gabapentin (NEURONTIN) 300 MG capsule Take 1-2 capsules (300-600 mg total) by mouth 3 (three) times daily as needed (pain). Patient taking differently: Take 300 mg by mouth See admin instructions. Take 300 mg twice daily, may increase to 600 mg twice daily as needed for pain 02/26/21     [name] Agreste, MD  hydrALAZINE (APRESOLINE) 25 MG tablet Take 1 tablet (25 mg total) by mouth in the morning and at bedtime. 07/13/21      Leonie Man, MD  magnesium oxide (MAG-OX) 400 MG tablet Take 400 mg by mouth daily.       [provider]  Multiple Vitamin (MULTIVITAMIN WITH MINERALS) TABS tablet Take 1 tablet by mouth daily. One-A-Day Multivitamin       [provider]  naproxen sodium (ALEVE) 220 MG tablet Take 440 mg by mouth daily as needed (pain).       [provider]  Omega-3 Fatty Acids (FISH OIL) 500 MG CAPS Take 500 mg by mouth daily.       [provider]  rosuvastatin (CRESTOR) 10 MG tablet Take 1 tablet (10 mg total) by mouth daily. 02/26/21 10/21/21   [name] Agreste, MD  sildenafil (VIAGRA) 100 MG tablet 1/2 to 1 tablet up to each day as needed. 07/08/11     [name] Agreste, MD  tamsulosin (FLOMAX) 0.4 MG CAPS capsule TAKE 1 CAPSULE EVERY DAY 08/03/21     [name] Agreste, MD  Turmeric 500 MG TABS Take 500 mg by mouth daily.       [provider]  vitamin B-12 (CYANOCOBALAMIN) 500 MCG tablet Take 500 mcg by mouth daily.       [provider]  Zinc 50 MG TABS Take 50 mg by mouth daily.       [provider]      Allergies:         Allergies  Allergen Reactions   Nitroglycerin Anaphylaxis      "[name] suggested he shouldn't take this med because it could kill him with his heart condition. 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  Tobacco Use   Smoking status: Former      Packs/day: 2.00      Years: 20.00      Total pack years: 40.00      Types: Cigarettes      Quit date:  03/28/1980      Years since quitting: 41.6   Smokeless tobacco: Never  Vaping Use   Vaping Use: Never used  Substance and Sexual Activity   Alcohol use: No      Alcohol/week: 0.0 standard drinks of alcohol   Drug use: No   Sexual activity: Not on file  Other Topics Concern   Not on file  Social History Narrative    He walks on a relatively regular basis about 15 minutes at a time mostly to the discomfort. He previously had walked up a 30-40 minutes a time without problems. Education: [employer].    Lives alone.    Right-handed.    One cup coffee daily.    Social Determinants of Health    Financial Resource Strain: Not on file  Food Insecurity: Not on file  Transportation Needs: Not on file  Physical Activity: Not on file  Stress: Not on file  Social Connections: Not on file  Intimate Partner Violence: Not on file    Family History:   The patient's family history includes Arthritis in his mother; Breast cancer in his sister; COPD in his father; Cancer in his sister; Cardiomyopathy in an other family member; Lung cancer in his father and sister. There is no history of Colon cancer.     ROS:  Please see the history of present illness.  All other ROS reviewed and negative.      Physical Exam/Data:          Vitals:    10/28/21 0700 10/28/21 0730 10/28/21 0745 10/28/21 0749  BP: 109/61 92/64 135/72    Pulse: 73 72 77    Resp: '15 11 15    '$ Temp:       98 F (36.7 C)  TempSrc:       Oral  SpO2: 98% 98% 99%      No intake or output data in the 24 hours ending 10/28/21 0826     10/27/2021    8:21 AM 10/01/2021    8:51 AM 09/22/2021    8:34 AM  Last 3 Weights  Weight (lbs) 179 lb 185 lb 184 lb  Weight (kg) 81.194 kg 83.915 kg 83.462 kg     There is no height or weight on file to calculate BMI.  General:  Well nourished, well developed, in no acute distress HEENT: normal Neck: no JVD Vascular: No carotid bruits; Distal pulses 2+ bilaterally   Cardiac:  RRR; no murmurs,  gallops or rubs, heart sounds are not distant Lungs: CTA b/l, no wheezing, rhonchi or rales  Abd: soft, nontender, no hepatomegaly  Ext: no edema Musculoskeletal:  No deformities, BUE and BLE strength normal and equal Skin: warm and dry  Neuro:  CNs 2-12 intact, no focal abnormalities noted Psych:  Normal affect      EKG:  The ECG that was done today was personally reviewed and demonstrates SR/V paced, QRS 134m   Relevant CV Studies:   10/06/20: TTE  1. There is no left ventricular thrombus. There is severe septal-lateral  wall systolic dyssynchrony due to LBBB. Left ventricular ejection  fraction, by estimation, is 20 to 25%. The left ventricle has severely  decreased function. The left ventricle  demonstrates global hypokinesis. The left ventricular internal cavity size  was moderately dilated. Left ventricular diastolic parameters are  consistent with Grade I diastolic dysfunction (impaired relaxation).   2. Right ventricular systolic function is normal. The right ventricular  size is normal. Tricuspid regurgitation signal is inadequate for assessing  PA pressure.   3. Left atrial size was mildly dilated.   4. The mitral valve is normal in structure. Mild to moderate mitral valve  regurgitation.   5. The aortic valve is tricuspid. There is mild calcification of the  aortic valve. There is mild thickening of the aortic valve. Aortic valve  regurgitation is not visualized. Mild aortic valve sclerosis is present,  with no evidence of aortic valve  stenosis.   6. The inferior vena cava is normal in size with greater than 50%  respiratory variability, suggesting right atrial pressure of 3 mmHg.   Comparison(s): The left ventricular function is unchanged. The left  ventricular diastolic function has improved. 06/16/20 EF <20%.    Laboratory Data:   High Sensitivity Troponin:   Last Labs       Recent Labs  Lab 10/28/21 0448  10/28/21 0636  TROPONINIHS 154* 135*         Chemistry Last Labs      Recent Labs  Lab 10/28/21 0448  NA 136  K 4.5  CL 107  CO2 22  GLUCOSE 127*  BUN 20  CREATININE 1.02  CALCIUM 8.8*  GFRNONAA >60  ANIONGAP 7      Last Labs   No results for input(s): "PROT", "ALBUMIN", "AST", "ALT", "ALKPHOS", "BILITOT" in the last 168 hours.   Lipids  Last Labs   No results for input(s): "CHOL", "TRIG", "HDL", "LABVLDL", "LDLCALC", "CHOLHDL" in the last 168 hours.   Hematology Last Labs      Recent Labs  Lab 10/28/21 0448  WBC 8.0  RBC 4.04*  HGB 12.5*  HCT 37.1*  MCV 91.8  MCH 30.9  MCHC 33.7  RDW 13.2  PLT 134*      Thyroid  Last Labs   No results for input(s): "TSH", "FREET4" in the last 168 hours.   BNP Last Labs   No results for input(s): "BNP", "PROBNP" in the last 168 hours.    DDimer  Last Labs   No results for input(s): "DDIMER" in the last 168 hours.       Radiology/Studies:  DG Chest Port 1 View Result Date: 10/28/2021 CLINICAL DATA:  Severe chest pain EXAM: PORTABLE CHEST 1 VIEW COMPARISON:  Yesterday FINDINGS: Normal heart size and mediastinal contours. Dual-chamber pacer leads from the left with stable positioning. Artifact from EKG leads. There is no edema, consolidation, effusion, or pneumothorax. IMPRESSION: No evidence of active disease. Electronically Signed   By: Jorje Guild M.D.   On: 10/28/2021 05:03      Assessment and Plan:    CP Suspect microperforation from ICD lead Reproducible with deep inspiration None otherwise He is hemodynamically stable currently   [name] [name] has been to see the patient Discussed at length with the patient and wife findings and need for lead revision And plan for [name]. Curt Bears to do his procedure this afternoon.   OK for breakfast, then NPO   NICM Chronic CHF Appears compensated currently Hold meds for now until post procedure   Admit to tele/obs     Risk Assessment/Risk Scores:      For questions or updates, please contact [place] Please consult [email or web address] for contact info under      Signed, [name] Jamaica, PA-C  10/28/2021 8:26 AM    [name] Attending  Patient seen and examined. Agree with above. The patient developed pleuritic chest pain this morning and has an elevated RV/ICD threshold. His Atrial and LBundle lead are working normally. I have recommended proceeding with ICD lead revision and will see if [name]. Curt Bears can help as I am in [place] today.    Carleene Overlie Relda Agosto,MD

## 2021-10-29 ENCOUNTER — Other Ambulatory Visit (HOSPITAL_COMMUNITY): Payer: Self-pay

## 2021-10-29 ENCOUNTER — Observation Stay (HOSPITAL_COMMUNITY): Payer: Medicare HMO

## 2021-10-29 ENCOUNTER — Encounter (HOSPITAL_COMMUNITY): Payer: Self-pay | Admitting: Cardiology

## 2021-10-29 ENCOUNTER — Inpatient Hospital Stay (HOSPITAL_COMMUNITY): Payer: Medicare HMO

## 2021-10-29 DIAGNOSIS — Z95810 Presence of automatic (implantable) cardiac defibrillator: Secondary | ICD-10-CM | POA: Diagnosis not present

## 2021-10-29 DIAGNOSIS — Z8739 Personal history of other diseases of the musculoskeletal system and connective tissue: Secondary | ICD-10-CM | POA: Diagnosis not present

## 2021-10-29 DIAGNOSIS — I314 Cardiac tamponade: Secondary | ICD-10-CM | POA: Diagnosis present

## 2021-10-29 DIAGNOSIS — I3139 Other pericardial effusion (noninflammatory): Secondary | ICD-10-CM

## 2021-10-29 DIAGNOSIS — Z888 Allergy status to other drugs, medicaments and biological substances status: Secondary | ICD-10-CM | POA: Diagnosis not present

## 2021-10-29 DIAGNOSIS — R0781 Pleurodynia: Secondary | ICD-10-CM | POA: Diagnosis not present

## 2021-10-29 DIAGNOSIS — M1990 Unspecified osteoarthritis, unspecified site: Secondary | ICD-10-CM | POA: Diagnosis present

## 2021-10-29 DIAGNOSIS — R079 Chest pain, unspecified: Secondary | ICD-10-CM | POA: Diagnosis present

## 2021-10-29 DIAGNOSIS — I5022 Chronic systolic (congestive) heart failure: Secondary | ICD-10-CM | POA: Diagnosis present

## 2021-10-29 DIAGNOSIS — I447 Left bundle-branch block, unspecified: Secondary | ICD-10-CM | POA: Diagnosis present

## 2021-10-29 DIAGNOSIS — Z7982 Long term (current) use of aspirin: Secondary | ICD-10-CM | POA: Diagnosis not present

## 2021-10-29 DIAGNOSIS — T82847A Pain from cardiac prosthetic devices, implants and grafts, initial encounter: Secondary | ICD-10-CM | POA: Diagnosis present

## 2021-10-29 DIAGNOSIS — Z87891 Personal history of nicotine dependence: Secondary | ICD-10-CM | POA: Diagnosis not present

## 2021-10-29 DIAGNOSIS — N400 Enlarged prostate without lower urinary tract symptoms: Secondary | ICD-10-CM | POA: Diagnosis present

## 2021-10-29 DIAGNOSIS — Z981 Arthrodesis status: Secondary | ICD-10-CM | POA: Diagnosis not present

## 2021-10-29 DIAGNOSIS — Z9049 Acquired absence of other specified parts of digestive tract: Secondary | ICD-10-CM | POA: Diagnosis not present

## 2021-10-29 DIAGNOSIS — I959 Hypotension, unspecified: Secondary | ICD-10-CM | POA: Diagnosis present

## 2021-10-29 DIAGNOSIS — Y838 Other surgical procedures as the cause of abnormal reaction of the patient, or of later complication, without mention of misadventure at the time of the procedure: Secondary | ICD-10-CM | POA: Diagnosis present

## 2021-10-29 DIAGNOSIS — Z79899 Other long term (current) drug therapy: Secondary | ICD-10-CM | POA: Diagnosis not present

## 2021-10-29 DIAGNOSIS — Z85820 Personal history of malignant melanoma of skin: Secondary | ICD-10-CM | POA: Diagnosis not present

## 2021-10-29 DIAGNOSIS — E781 Pure hyperglyceridemia: Secondary | ICD-10-CM | POA: Diagnosis present

## 2021-10-29 DIAGNOSIS — E785 Hyperlipidemia, unspecified: Secondary | ICD-10-CM | POA: Diagnosis present

## 2021-10-29 DIAGNOSIS — Z8261 Family history of arthritis: Secondary | ICD-10-CM | POA: Diagnosis not present

## 2021-10-29 DIAGNOSIS — Z452 Encounter for adjustment and management of vascular access device: Secondary | ICD-10-CM | POA: Diagnosis not present

## 2021-10-29 DIAGNOSIS — I428 Other cardiomyopathies: Secondary | ICD-10-CM | POA: Diagnosis present

## 2021-10-29 LAB — CBC
HCT: 35.4 % — ABNORMAL LOW (ref 39.0–52.0)
Hemoglobin: 11.9 g/dL — ABNORMAL LOW (ref 13.0–17.0)
MCH: 30.4 pg (ref 26.0–34.0)
MCHC: 33.6 g/dL (ref 30.0–36.0)
MCV: 90.5 fL (ref 80.0–100.0)
Platelets: 111 10*3/uL — ABNORMAL LOW (ref 150–400)
RBC: 3.91 MIL/uL — ABNORMAL LOW (ref 4.22–5.81)
RDW: 13.5 % (ref 11.5–15.5)
WBC: 15.4 10*3/uL — ABNORMAL HIGH (ref 4.0–10.5)
nRBC: 0 % (ref 0.0–0.2)

## 2021-10-29 LAB — COMPREHENSIVE METABOLIC PANEL
ALT: 16 U/L (ref 0–44)
AST: 19 U/L (ref 15–41)
Albumin: 3.4 g/dL — ABNORMAL LOW (ref 3.5–5.0)
Alkaline Phosphatase: 51 U/L (ref 38–126)
Anion gap: 9 (ref 5–15)
BUN: 23 mg/dL (ref 8–23)
CO2: 22 mmol/L (ref 22–32)
Calcium: 8.9 mg/dL (ref 8.9–10.3)
Chloride: 106 mmol/L (ref 98–111)
Creatinine, Ser: 1.09 mg/dL (ref 0.61–1.24)
GFR, Estimated: >60 mL/min (ref >60)
Glucose, Bld: 171 mg/dL — ABNORMAL HIGH (ref 70–99)
Potassium: 4.5 mmol/L (ref 3.5–5.1)
Sodium: 137 mmol/L (ref 135–145)
Total Bilirubin: 0.8 mg/dL (ref 0.3–1.2)
Total Protein: 6.2 g/dL — ABNORMAL LOW (ref 6.5–8.1)

## 2021-10-29 LAB — ECHOCARDIOGRAM LIMITED: S' Lateral: 5.3 cm

## 2021-10-29 LAB — COOXEMETRY PANEL
Carboxyhemoglobin: 1.1 % (ref 0.5–1.5)
Methemoglobin: <0.7 % (ref 0.0–1.5)
O2 Saturation: 35.6 %
Total hemoglobin: 11 g/dL — ABNORMAL LOW (ref 12.0–16.0)

## 2021-10-29 MED ORDER — FENTANYL CITRATE PF 50 MCG/ML IJ SOSY
12.5000 ug | PREFILLED_SYRINGE | Freq: Once | INTRAMUSCULAR | Status: AC
  Administered 2021-10-29: 12.5 ug via INTRAVENOUS

## 2021-10-29 MED ORDER — AMIODARONE IV BOLUS ONLY 150 MG/100ML: 150.0000 mg | Freq: Once | INTRAVENOUS | Status: DC

## 2021-10-29 MED ORDER — METOPROLOL SUCCINATE ER 25 MG PO TB24
25.0000 mg | ORAL_TABLET | Freq: Every day | ORAL | Status: DC
  Administered 2021-10-29: 25 mg via ORAL
  Filled 2021-10-29: qty 1

## 2021-10-29 MED ORDER — AMIODARONE HCL IN DEXTROSE 360-4.14 MG/200ML-% IV SOLN
INTRAVENOUS | Status: AC
  Filled 2021-10-29: qty 200

## 2021-10-29 MED ORDER — SODIUM CHLORIDE 0.9 % IV SOLN: INTRAVENOUS | Status: DC | PRN

## 2021-10-29 MED ORDER — AMIODARONE LOAD VIA INFUSION
150.0000 mg | Freq: Once | INTRAVENOUS | Status: AC
  Administered 2021-10-29: 150 mg via INTRAVENOUS
  Filled 2021-10-29: qty 83.34

## 2021-10-29 MED ORDER — METOPROLOL TARTRATE 25 MG PO TABS
25.0000 mg | ORAL_TABLET | Freq: Three times a day (TID) | ORAL | Status: DC | PRN
  Administered 2021-10-29: 25 mg via ORAL
  Filled 2021-10-29: qty 1

## 2021-10-29 MED ORDER — AMIODARONE HCL IN DEXTROSE 360-4.14 MG/200ML-% IV SOLN
60.0000 mg/h | INTRAVENOUS | Status: DC
  Administered 2021-10-29 – 2021-10-30 (×2): 60 mg/h via INTRAVENOUS
  Filled 2021-10-29: qty 200

## 2021-10-29 MED ORDER — COLCHICINE 0.6 MG PO TABS
0.6000 mg | ORAL_TABLET | Freq: Two times a day (BID) | ORAL | Status: DC
  Administered 2021-10-29 – 2021-11-02 (×9): 0.6 mg via ORAL
  Filled 2021-10-29 (×9): qty 1

## 2021-10-29 MED ORDER — KETOROLAC TROMETHAMINE 15 MG/ML IJ SOLN
7.5000 mg | Freq: Four times a day (QID) | INTRAMUSCULAR | Status: DC | PRN
  Administered 2021-10-29 (×3): 7.5 mg via INTRAVENOUS
  Filled 2021-10-29 (×3): qty 1

## 2021-10-29 MED ORDER — LIDOCAINE HCL (PF) 1 % IJ SOLN: 10.0000 mL | Freq: Once | INTRAMUSCULAR | Status: DC

## 2021-10-29 MED ORDER — AMIODARONE HCL IN DEXTROSE 360-4.14 MG/200ML-% IV SOLN
30.0000 mg/h | INTRAVENOUS | Status: DC
  Administered 2021-10-30 (×2): 30 mg/h via INTRAVENOUS
  Filled 2021-10-29: qty 200

## 2021-10-29 NOTE — Progress Notes (Signed)
Pericardial drain removed by Dr. Burt Knack at bedside w/no abn events.... Renard Hamper PA at bedside and notified of 11 beat run VT   Pt is A&O x4... no acute distress and was assisted to bedside commode.

## 2021-10-29 NOTE — TOC Benefit Eligibility Note (Signed)
Patient Research scientist (life sciences) completed.     The patient is currently admitted and upon discharge could be taking COLCHICINE '06MG'$ .   The current 30 day co-pay is, $15.   The patient is insured through [employer].

## 2021-10-29 NOTE — Progress Notes (Signed)
  Amiodarone Drug - Drug Interaction Consult Note  Recommendations: No interactions noted at this time  Amiodarone is metabolized by the cytochrome P450 system and therefore has the potential to cause many drug interactions. Amiodarone has an average plasma half-life of 50 days (range 20 to 100 days).   There is potential for drug interactions to occur several weeks or months after stopping treatment and the onset of drug interactions may be slow after initiating amiodarone.   '[]'$  Statins: Increased risk of myopathy. Simvastatin- restrict dose to '20mg'$  daily. Other statins: counsel patients to report any muscle pain or weakness immediately.  '[]'$  Anticoagulants: Amiodarone can increase anticoagulant effect. Consider warfarin dose reduction. Patients should be monitored closely and the dose of anticoagulant altered accordingly, remembering that amiodarone levels take several weeks to stabilize.  '[]'$  Antiepileptics: Amiodarone can increase plasma concentration of phenytoin, the dose should be reduced. Note that small changes in phenytoin dose can result in large changes in levels. Monitor patient and counsel on signs of toxicity.  '[]'$  Beta blockers: increased risk of bradycardia, AV block and myocardial depression. Sotalol - avoid concomitant use.  '[]'$   Calcium channel blockers (diltiazem and verapamil): increased risk of bradycardia, AV block and myocardial depression.  '[]'$   Cyclosporine: Amiodarone increases levels of cyclosporine. Reduced dose of cyclosporine is recommended.  '[]'$  Digoxin dose should be halved when amiodarone is started.  '[]'$  Diuretics: increased risk of cardiotoxicity if hypokalemia occurs.  '[]'$  Oral hypoglycemic agents (glyburide, glipizide, glimepiride): increased risk of hypoglycemia. Patient's glucose levels should be monitored closely when initiating amiodarone therapy.   '[]'$  Drugs that prolong the QT interval:  Torsades de pointes risk may be increased with concurrent use -  avoid if possible.  Monitor QTc, also keep magnesium/potassium WNL if concurrent therapy can't be avoided.  Antibiotics: e.g. fluoroquinolones, erythromycin.  Antiarrhythmics: e.g. quinidine, procainamide, disopyramide, sotalol.  Antipsychotics: e.g. phenothiazines, haloperidol.   Lithium, tricyclic antidepressants, and methadone.  Thank You,  Corinda Gubler  10/29/2021 10:29 PM

## 2021-10-29 NOTE — Progress Notes (Signed)
   Received a page from RN about concerns of patient being in VT and not feeling well. I went to review telemetry and see patient. Telemetry shows a wide complex tachycardia with irregular rhythm and rates ranging in the 140s to 170s. He has a known LBBB. This is similar to the rhythm he has been in all day. Reviewed EP's note earlier today and rhythm not felt to be VT. Felt to be ST/AT with native LBBB. I went to see patient and he stated he was just not feeling good. He said he felt sad. He also reported some mild pleuritic pain with deep inspiration but this has greatly improved from yesterday. He is on Colchicine. He does not look like he is in any acute distress. No adventitious heart or lung sounds on exam.  I reviewed telemetry and EKG with Dr. Margaretann Loveless - we were concerned that due to irregularity he may be in atrial fibrillation. While looking at this with her, I received another call from RN that it looked like patient went back into ventricular paced rhythm in the low 110s. RN reported that patient started feeling better after this.Reviewed telemetry and confirmed this. We were wondering if this could be PMT. I called and spoke with [place]. He is very familiar with the patient and has been involved in his case. He was just looking at patient and device with multiple EP providers around 5pm when patient was also having irregular rhythm. He confirmed that patient is not in atrial fibrillation - it is sinus with ectopy. He also confirmed that he is not in PMT and that he is pacing appropriately at this time (they want him pacing at his intrinsic rate). Aaron Edelman is going to come back in tomorrow morning to look at device. Called and update RN. Continue Toprol-XL for now. BP soft at times so will not up-titrate. I will put in PRN Lopressor though for persistent heart rates > 150 bpm.  Darreld Mclean, PA-C 10/29/2021 7:32 PM

## 2021-10-29 NOTE — Plan of Care (Signed)
Episode of WCT-likely AT w underlying LBBB.  Amio bolus and then ggt. FU BMP and MG

## 2021-10-29 NOTE — Progress Notes (Addendum)
I do not think he has been having VT, looks like conducted ST/AT with his native BBB. I got the programmer to look at it but had stopped and so far, not returned.  Had the same last night Resumed him on low dose toprol Telemetry reviewed has since been sinus w/V pacing, BP stable OOB to chair and comfortable  Will d/w [employer] rep, may need to adjust AV intervals  Now a couple hours post drain removal and HD stable, will have his central line removed.  Tommye Standard, PA-C  EP Attending  Agree with above.   Carleene Overlie Tomika Eckles,MD

## 2021-10-29 NOTE — Progress Notes (Signed)
Paged after hours cardiology PA

## 2021-10-29 NOTE — Progress Notes (Signed)
Tx pt to [facility] ... Pt is not in any acute distress... denies SOB, dizziness, Headaches.... wife at bedside assisting.

## 2021-10-29 NOTE — Progress Notes (Signed)
Paged Cardiology... pt reports he "doesn't feel well"   Pt unable to tell me if he is in pain, nauseas, lightheaded... simply says "he doesn't feel well"

## 2021-10-29 NOTE — Progress Notes (Signed)
  Echocardiogram 2D Echocardiogram has been performed.  [name] [name] 10/29/2021, 9:38 AM

## 2021-10-29 NOTE — Progress Notes (Signed)
  Transition of Care [facility]) Screening Note   Patient Details  Name: [name] [name] Date of Birth: [birth year]   [facility]) CM/SW Contact:    Milas Gain, [place] Phone Number: 10/29/2021, 4:03 PM    Transition of Care Department [facility]) has reviewed patient and no TOC needs have been identified at this time. We will continue to monitor patient advancement through interdisciplinary progression rounds. If new patient transition needs arise, please place a TOC consult.

## 2021-10-29 NOTE — Progress Notes (Signed)
Rec'd call from PA... sts she is still reviewing pt's ekg and HR rhythm w/MD and will call w/further instructions if needed.

## 2021-10-29 NOTE — Progress Notes (Signed)
After PA left, pt went back into ventricular paced rhythm w/visible ventricular pacing spikes... HR now controlled in the low 100's  Also, pt reports he feels "much better"  Notified on call PA Sarajane Jews)

## 2021-10-29 NOTE — Progress Notes (Addendum)
Progress Note  Patient Name: [name] [name] Date of Encounter: 10/29/2021  [facility] Cardiologist: Glenetta Hew, MD   Subjective   C/w CP, no SOB, observed to visit with family, able to speak in full sentences and in no distress, in pretty good spirits Not much of an appetite this Am  Inpatient Medications    Scheduled Meds:  Chlorhexidine Gluconate Cloth  6 each Topical Daily   metoprolol tartrate  25 mg Oral Once   sodium chloride flush  3 mL Intravenous Q12H   Continuous Infusions:  sodium chloride Stopped (10/29/21 0425)    ceFAZolin (ANCEF) IV Stopped (10/29/21 0221)   PRN Meds: Place/Maintain arterial line **AND** sodium chloride, acetaminophen, fentaNYL (SUBLIMAZE) injection, Heparin (Porcine) in NaCl, morphine injection, ondansetron (ZOFRAN) IV, mouth rinse   Vital Signs    Vitals:   10/29/21 0100 10/29/21 0200 10/29/21 0300 10/29/21 0400  BP: (!) 92/59 110/61 110/62 100/66  Pulse: (!) 105 (!) 102 98 (!) 103  Resp: 19 (!) 22 (!) 21 (!) 26  Temp:      TempSrc:      SpO2: 96% 97% 97% 96%    Intake/Output Summary (Last 24 hours) at 10/29/2021 0804 Last data filed at 10/29/2021 0600 Gross per 24 hour  Intake 320.27 ml  Output 225 ml  Net 95.27 ml      10/27/2021    8:21 AM 10/01/2021    8:51 AM 09/22/2021    8:34 AM  Last 3 Weights  Weight (lbs) 179 lb 185 lb 184 lb  Weight (kg) 81.194 kg 83.915 kg 83.462 kg      Telemetry    SR/VP currently 80's-100 - Personally Reviewed  ECG    Likely a ST (non-paced) 130bpm  - Personally Reviewed  Physical Exam   GEN: No acute distress.   Neck: No JVD Cardiac: RRR, no murmurs, + rub Respiratory: CTA b/l. GI: Soft, nontender, non-distended  MS: No edema; No deformity. Neuro:  Nonfocal  Psych: Normal affect   Labs    High Sensitivity Troponin:   Recent Labs  Lab 10/28/21 0448 10/28/21 0636  TROPONINIHS 154* 135*     Chemistry Recent Labs  Lab 10/28/21 0448 10/29/21 0615  NA 136 137  K  4.5 4.5  CL 107 106  CO2 22 22  GLUCOSE 127* 171*  BUN 20 23  CREATININE 1.02 1.09  CALCIUM 8.8* 8.9  PROT  --  6.2*  ALBUMIN  --  3.4*  AST  --  19  ALT  --  16  ALKPHOS  --  51  BILITOT  --  0.8  GFRNONAA >60 >60  ANIONGAP 7 9    Lipids No results for input(s): "CHOL", "TRIG", "HDL", "LABVLDL", "LDLCALC", "CHOLHDL" in the last 168 hours.  Hematology Recent Labs  Lab 10/28/21 0441 10/28/21 0448 10/29/21 0615  WBC 8.6 8.0 15.4*  RBC 3.64* 4.04* 3.91*  HGB 11.0* 12.5* 11.9*  HCT 33.4* 37.1* 35.4*  MCV 91.8 91.8 90.5  MCH 30.2 30.9 30.4  MCHC 32.9 33.7 33.6  RDW 13.2 13.2 13.5  PLT 109* 134* 111*   Thyroid No results for input(s): "TSH", "FREET4" in the last 168 hours.  BNPNo results for input(s): "BNP", "PROBNP" in the last 168 hours.  DDimer No results for input(s): "DDIMER" in the last 168 hours.   Radiology    10/29/21: CXR  IMPRESSION: 1. Right IJ line with tip at the right atrium. Pericardial drain in place. 2. Unchanged cardiopericardial size for the  degree of inflation. No pneumothorax.  Cardiac Studies    10/28/21: TTE #2 IMPRESSIONS   1. Left ventricular ejection fraction, by estimation, is 20 to 25%. The  left ventricle has severely decreased function. The left ventricle  demonstrates global hypokinesis.   2. Right ventricular systolic function was not well visualized.   3. Large pericardial effusion. The pericardial effusion is  circumferential.   FINDINGS   Left Ventricle: Left ventricular ejection fraction, by estimation, is 20  to 25%. The left ventricle has severely decreased function. The left  ventricle demonstrates global hypokinesis.   Right Ventricle: Right ventricular systolic function was not well  visualized.   Pericardium: A large pericardial effusion is present. The pericardial  effusion is circumferential. There is diastolic collapse of the right  ventricular free wall.   Additional Comments: A device lead is visualized.     10/28/21: TTE #1  1. Left ventricular ejection fraction, by estimation, is 20 to 25%. The  left ventricle has severely decreased function. The left ventricle  demonstrates global hypokinesis.   2. Right ventricular systolic function was not well visualized. The right  ventricular size is normal. There is normal pulmonary artery systolic  pressure.   3. Device lead noted.   4. Small pericardial effusion localized anterior of the Right ventricle.  The right ventricle does not fully relax suggesting there may be a slight  increase in intrapericardial pressure but there are no objective findings  of cardiac tamponande. If  clinical picture depicts repeat echo may be considered in 24 hr with  doppler. There is no evidence of cardiac tamponade.   5. The mitral valve is normal in structure. Trivial mitral valve  regurgitation. No evidence of mitral stenosis.   6. The aortic valve is tricuspid. There is mild calcification of the  aortic valve. Aortic valve regurgitation is not visualized. Aortic valve  sclerosis is present, with no evidence of aortic valve stenosis.   7. The inferior vena cava is normal in size with greater than 50%  respiratory variability, suggesting right atrial pressure of 3 mmHg.   10/06/20: TTE  1. There is no left ventricular thrombus. There is severe septal-lateral  wall systolic dyssynchrony due to LBBB. Left ventricular ejection  fraction, by estimation, is 20 to 25%. The left ventricle has severely  decreased function. The left ventricle  demonstrates global hypokinesis. The left ventricular internal cavity size  was moderately dilated. Left ventricular diastolic parameters are  consistent with Grade I diastolic dysfunction (impaired relaxation).   2. Right ventricular systolic function is normal. The right ventricular  size is normal. Tricuspid regurgitation signal is inadequate for assessing  PA pressure.   3. Left atrial size was mildly dilated.   4. The  mitral valve is normal in structure. Mild to moderate mitral valve  regurgitation.   5. The aortic valve is tricuspid. There is mild calcification of the  aortic valve. There is mild thickening of the aortic valve. Aortic valve  regurgitation is not visualized. Mild aortic valve sclerosis is present,  with no evidence of aortic valve  stenosis.   6. The inferior vena cava is normal in size with greater than 50%  respiratory variability, suggesting right atrial pressure of 3 mmHg.   Comparison(s): The left ventricular function is unchanged. The left  ventricular diastolic function has improved. 06/16/20 EF <20%.   Patient Profile     76 y.o. male with NICM, chronic CHF (systolic), LBBB admitted with CP 2/2 ICD lead microperforation  Assessment & Plan    CP Suspect microperforation from ICD lead  S/p lead revision yesterday with Dr. Curt Bears, intra procedural with escalating pain and hypotension noted to have large pericardial effusion > tamponade and emergent pericardial drain placed.  Remains with CP, rub on exam Had 155m out on drain since in ICU yesterday afternoon Limited echo ordered for this AM Dr. TLovena Lehas seen and examined the patient this AM  Pending echo Will d/w dr. CBurt Knackhopefully get the pericardial drain pulled, suspect the primary driver for his ongoing pain.  Device check this AM with stable measurements CXT with stable lead position, no ptx Site is stable Pericardial drain site also stable  Labs stable H/H looks ok BP stable  Add PRN Toradol   NICM Chronic CHF Appears compensated currently Continue to hold home meds for now    For questions or updates, please contact [facility] Please consult [email or web address] for contact info under   Signed, RBaldwin Jamaica PA-C  10/29/2021, 8:04 AM    EP Attending  Events noted. He has pleuritic chest pain. We have removed the drain after documentation of 2D echo. He has had some atrial arrhythmias which  are expected. We will advance activity tomorrow and hopefully he can go home on Sunday. Continue toradol and colchicine for pleuritic pain. He has a pericardial friction rub.   GCarleene OverlieTaylor,MD

## 2021-10-30 ENCOUNTER — Inpatient Hospital Stay (HOSPITAL_COMMUNITY): Payer: Medicare HMO

## 2021-10-30 DIAGNOSIS — I959 Hypotension, unspecified: Secondary | ICD-10-CM

## 2021-10-30 DIAGNOSIS — R0781 Pleurodynia: Secondary | ICD-10-CM | POA: Diagnosis not present

## 2021-10-30 DIAGNOSIS — I3139 Other pericardial effusion (noninflammatory): Secondary | ICD-10-CM | POA: Diagnosis not present

## 2021-10-30 DIAGNOSIS — I5022 Chronic systolic (congestive) heart failure: Secondary | ICD-10-CM

## 2021-10-30 LAB — BASIC METABOLIC PANEL
Anion gap: 8 (ref 5–15)
Anion gap: 4 — ABNORMAL LOW (ref 5–15)
BUN: 32 mg/dL — ABNORMAL HIGH (ref 8–23)
BUN: 29 mg/dL — ABNORMAL HIGH (ref 8–23)
CO2: 20 mmol/L — ABNORMAL LOW (ref 22–32)
CO2: 24 mmol/L (ref 22–32)
Calcium: 8.9 mg/dL (ref 8.9–10.3)
Calcium: 8.4 mg/dL — ABNORMAL LOW (ref 8.9–10.3)
Chloride: 105 mmol/L (ref 98–111)
Chloride: 105 mmol/L (ref 98–111)
Creatinine, Ser: 1.68 mg/dL — ABNORMAL HIGH (ref 0.61–1.24)
Creatinine, Ser: 1.25 mg/dL — ABNORMAL HIGH (ref 0.61–1.24)
GFR, Estimated: 42 mL/min — ABNORMAL LOW (ref >60)
GFR, Estimated: 59 mL/min — ABNORMAL LOW (ref >60)
Glucose, Bld: 217 mg/dL — ABNORMAL HIGH (ref 70–99)
Glucose, Bld: 171 mg/dL — ABNORMAL HIGH (ref 70–99)
Potassium: 4.9 mmol/L (ref 3.5–5.1)
Potassium: 4.1 mmol/L (ref 3.5–5.1)
Sodium: 133 mmol/L — ABNORMAL LOW (ref 135–145)
Sodium: 133 mmol/L — ABNORMAL LOW (ref 135–145)

## 2021-10-30 LAB — CBC
HCT: 30.1 % — ABNORMAL LOW (ref 39.0–52.0)
HCT: 32.8 % — ABNORMAL LOW (ref 39.0–52.0)
Hemoglobin: 10.1 g/dL — ABNORMAL LOW (ref 13.0–17.0)
Hemoglobin: 10.9 g/dL — ABNORMAL LOW (ref 13.0–17.0)
MCH: 30.6 pg (ref 26.0–34.0)
MCH: 30.6 pg (ref 26.0–34.0)
MCHC: 33.2 g/dL (ref 30.0–36.0)
MCHC: 33.6 g/dL (ref 30.0–36.0)
MCV: 91.2 fL (ref 80.0–100.0)
MCV: 92.1 fL (ref 80.0–100.0)
Platelets: 108 10*3/uL — ABNORMAL LOW (ref 150–400)
Platelets: 94 10*3/uL — ABNORMAL LOW (ref 150–400)
RBC: 3.3 MIL/uL — ABNORMAL LOW (ref 4.22–5.81)
RBC: 3.56 MIL/uL — ABNORMAL LOW (ref 4.22–5.81)
RDW: 13.5 % (ref 11.5–15.5)
RDW: 13.7 % (ref 11.5–15.5)
WBC: 11.7 10*3/uL — ABNORMAL HIGH (ref 4.0–10.5)
WBC: 12 10*3/uL — ABNORMAL HIGH (ref 4.0–10.5)
nRBC: 0 % (ref 0.0–0.2)
nRBC: 0 % (ref 0.0–0.2)

## 2021-10-30 LAB — TROPONIN I (HIGH SENSITIVITY)
Troponin I (High Sensitivity): 44 ng/L — ABNORMAL HIGH (ref <18)
Troponin I (High Sensitivity): 63 ng/L — ABNORMAL HIGH (ref <18)

## 2021-10-30 LAB — BLOOD GAS, VENOUS
Acid-base deficit: 3.4 mmol/L — ABNORMAL HIGH (ref 0.0–2.0)
Bicarbonate: 21.4 mmol/L (ref 20.0–28.0)
O2 Saturation: 80.5 %
Patient temperature: 37
pCO2, Ven: 37 mmHg — ABNORMAL LOW (ref 44–60)
pH, Ven: 7.37 (ref 7.25–7.43)
pO2, Ven: 50 mmHg — ABNORMAL HIGH (ref 32–45)

## 2021-10-30 LAB — LACTIC ACID, PLASMA
Lactic Acid, Venous: 1.1 mmol/L (ref 0.5–1.9)
Lactic Acid, Venous: 1.7 mmol/L (ref 0.5–1.9)

## 2021-10-30 LAB — COOXEMETRY PANEL
Carboxyhemoglobin: 1.2 % (ref 0.5–1.5)
Methemoglobin: <0.7 % (ref 0.0–1.5)
O2 Saturation: 56.5 %
Total hemoglobin: 10.4 g/dL — ABNORMAL LOW (ref 12.0–16.0)

## 2021-10-30 LAB — ECHOCARDIOGRAM LIMITED

## 2021-10-30 LAB — MAGNESIUM: Magnesium: 2.1 mg/dL (ref 1.7–2.4)

## 2021-10-30 MED ORDER — FUROSEMIDE 10 MG/ML IJ SOLN
60.0000 mg | Freq: Once | INTRAMUSCULAR | Status: AC
  Administered 2021-10-30: 60 mg via INTRAVENOUS
  Filled 2021-10-30: qty 6

## 2021-10-30 MED ORDER — DOBUTAMINE INFUSION FOR EP/ECHO/NUC (1000 MCG/ML)
40.0000 ug/kg/min | INTRAVENOUS | Status: DC
  Administered 2021-10-30 (×2): 40 ug/kg/min via INTRAVENOUS
  Filled 2021-10-30 (×4): qty 250

## 2021-10-30 MED ORDER — DOBUTAMINE IN D5W 4-5 MG/ML-% IV SOLN: 40.0000 ug/kg/min | INTRAVENOUS | Status: DC

## 2021-10-30 MED ORDER — NOREPINEPHRINE 16 MG/250ML-% IV SOLN
0.0000 ug/min | INTRAVENOUS | Status: DC
  Filled 2021-10-30: qty 250

## 2021-10-30 MED ORDER — SODIUM CHLORIDE 0.9 % IV SOLN: INTRAVENOUS | Status: DC | PRN

## 2021-10-30 NOTE — Progress Notes (Signed)
  Echocardiogram 2D Echocardiogram has been performed.  [name] [name] 10/30/2021, 8:57 AM

## 2021-10-30 NOTE — Procedures (Signed)
Arterial Catheter Insertion Procedure Note  [name] [name]  [id number]  10/31/1945  Date:10/30/21  Time:12:40 AM    Provider Performing: Roderick Pee    Procedure: Insertion of Arterial Line [phone]) without US guidance  Indication(s) Blood pressure monitoring and/or need for frequent ABGs  Consent Risks of the procedure as well as the alternatives and risks of each were explained to the patient and/or caregiver.  Consent for the procedure was obtained and is signed in the bedside chart  Anesthesia None   Time Out Verified patient identification, verified procedure, site/side was marked, verified correct patient position, special equipment/implants available, medications/allergies/relevant history reviewed, required imaging and test results available.   Sterile Technique Maximal sterile technique including full sterile barrier drape, hand hygiene, sterile gown, sterile gloves, mask, hair covering, sterile ultrasound probe cover (if used).   Procedure Description Area of catheter insertion was cleaned with chlorhexidine and draped in sterile fashion. Without real-time ultrasound guidance an arterial catheter was placed into the left radial artery.  Appropriate arterial tracings confirmed on monitor.     Complications/Tolerance None; patient tolerated the procedure well.   EBL Minimal   Specimen(s) None

## 2021-10-30 NOTE — Progress Notes (Signed)
Chaplain responded to family and home church request to check in on pt.  Provided supportive, listening presence and facilitated storytelling.  Pt feels some anxiety about the recent complications of his procedure on Wednesday and worries about whether it was the right decision to proceed. Partner, [name] [name], who is a former Sales executive, has been constantly by his side, is very reassuring.  Their [name] [name] faith is an Associate Professor and support.  Provided prayer and will continue to f/u through the day.    Please contact as needed if support is needed earlier.   [name] [name], [place] [phone]    10/30/21 1000  Clinical Encounter Type  Visited With Patient and family together  Visit Type Initial;Spiritual support  Referral From Family  Consult/Referral To Chaplain  Spiritual Encounters  Spiritual Needs Prayer  Stress Factors  Patient Stress Factors Health changes  Family Stress Factors Loss of control

## 2021-10-30 NOTE — Progress Notes (Signed)
Around 2130 patient went into wide complex tachycardic rhythm 150s to 180s, metoprolol was given per order. The patients rhythm was not returned to normal and cardiology MD was notified, '150mg'$  Amiodarone bolus was ordered and given as well as a '60mg'$ /hr drip. The patients rhythm was relieved temporarily but went back into rhythm after about 3 minutes. Patient became symptomatic, diaphoretic and hypotensive. Cardiology MD was made aware and came to assess the patient. Patient was started on levo and dobutamine following assessment. See MAR and Lab Results.

## 2021-10-30 NOTE — Progress Notes (Addendum)
EP Progress Note  Patient Name: [name] [name] Date of Encounter: 10/30/2021  [facility] Cardiologist: Glenetta Hew, MD   Subjective   Hypotensive episode this morning, started on levo, now improved.  Wife at bedside.  Inpatient Medications    Scheduled Meds:  Chlorhexidine Gluconate Cloth  6 each Topical Daily   colchicine  0.6 mg Oral BID   lidocaine (PF)  10 mL Infiltration Once   metoprolol succinate  25 mg Oral Daily   sodium chloride flush  3 mL Intravenous Q12H   Continuous Infusions:  sodium chloride Stopped (10/29/21 0425)   sodium chloride     sodium chloride     amiodarone 30 mg/hr (10/30/21 0600)   DOBUTamine     norepinephrine (LEVOPHED) Adult infusion     PRN Meds: Place/Maintain arterial line **AND** sodium chloride, Place/Maintain arterial line **AND** sodium chloride, Place/Maintain arterial line **AND** sodium chloride, acetaminophen, fentaNYL (SUBLIMAZE) injection, Heparin (Porcine) in NaCl, ketorolac, metoprolol tartrate, morphine injection, ondansetron (ZOFRAN) IV, mouth rinse   Vital Signs    Vitals:   10/30/21 0600 10/30/21 0615 10/30/21 0630 10/30/21 0700  BP: 101/65 99/63  115/66  Pulse: 81 84 88 85  Resp: 19 (!) 26 (!) 23 (!) 21  Temp:      TempSrc:      SpO2: 97% 96% 98% 98%    Intake/Output Summary (Last 24 hours) at 10/30/2021 0757 Last data filed at 10/30/2021 0700 Gross per 24 hour  Intake 773.32 ml  Output 885 ml  Net -111.68 ml      10/27/2021    8:21 AM 10/01/2021    8:51 AM 09/22/2021    8:34 AM  Last 3 Weights  Weight (lbs) 179 lb 185 lb 184 lb  Weight (kg) 81.194 kg 83.915 kg 83.462 kg      Telemetry    Personally Reviewed  ECG    Personally Reviewed  Physical Exam   GEN: No acute distress.   Neck: No JVD Cardiac: Irregularly irregular, no murmurs, rubs, or gallops.  Respiratory: Clear to auscultation bilaterally. GI: Soft, nontender, non-distended  MS: No edema; No deformity. Neuro:  Nonfocal   Psych: Normal affect   Labs    High Sensitivity Troponin:   Recent Labs  Lab 10/28/21 0448 10/28/21 0636 10/29/21 2340 10/30/21 0524  TROPONINIHS 154* 135* 63* 44*     Chemistry Recent Labs  Lab 10/29/21 0615 10/29/21 2323 10/30/21 0524  NA 137 133* 133*  K 4.5 4.9 4.1  CL 106 105 105  CO2 22 20* 24  GLUCOSE 171* 217* 171*  BUN 23 32* 29*  CREATININE 1.09 1.68* 1.25*  CALCIUM 8.9 8.9 8.4*  MG  --  2.1  --   PROT 6.2*  --   --   ALBUMIN 3.4*  --   --   AST 19  --   --   ALT 16  --   --   ALKPHOS 51  --   --   BILITOT 0.8  --   --   GFRNONAA >60 42* 59*  ANIONGAP 9 8 4*    Lipids No results for input(s): "CHOL", "TRIG", "HDL", "LABVLDL", "LDLCALC", "CHOLHDL" in the last 168 hours.  Hematology Recent Labs  Lab 10/29/21 0615 10/29/21 2346 10/30/21 0524  WBC 15.4* 12.0* 11.7*  RBC 3.91* 3.56* 3.30*  HGB 11.9* 10.9* 10.1*  HCT 35.4* 32.8* 30.1*  MCV 90.5 92.1 91.2  MCH 30.4 30.6 30.6  MCHC 33.6 33.2 33.6  RDW 13.5 13.7  13.5  PLT 111* 108* 94*   Thyroid No results for input(s): "TSH", "FREET4" in the last 168 hours.  BNPNo results for input(s): "BNP", "PROBNP" in the last 168 hours.  DDimer No results for input(s): "DDIMER" in the last 168 hours.   Radiology    DG CHEST PORT 1 VIEW  Result Date: 10/29/2021 CLINICAL DATA:  Chest pain EXAM: PORTABLE CHEST 1 VIEW COMPARISON:  4:39 a.m. FINDINGS: Right internal jugular central venous catheter with its tip within the right atrium is unchanged. Pericardial drain is not well visualized and may have been removed in the interval. Lungs are clear. No pneumothorax or pleural effusion. Cardiac size is within normal limits. Left subclavian pacemaker defibrillator is unchanged. Pulmonary vascularity is normal. No acute bone abnormality. IMPRESSION: Suspect interval removal of pericardial drain. Unchanged cardiac size. Electronically Signed   By: Fidela Salisbury M.D.   On: 10/29/2021 23:49   ECHOCARDIOGRAM LIMITED  Result  Date: 10/29/2021    ECHOCARDIOGRAM LIMITED REPORT   Patient Name:   [name] [name] Date of Exam: 10/29/2021 Medical Rec #:  [id number]           Height:       68.0 in Accession #:    [id number]          Weight:       179.0 lb Date of Birth:  [birth year]            BSA:          1.950 m Patient Age:    76 years            BP:           121/68 mmHg Patient Gender: M                   HR:           94 bpm. Exam Location:  Inpatient Procedure: Limited Color Doppler and Limited Echo Indications:    pericardial effusion  History:        Patient has prior history of Echocardiogram examinations, most                 recent 10/28/2021. Cardiomyopathy, Defibrillator and Pacemaker;                 Signs/Symptoms:Chest Pain.  Sonographer:    Johny Chess RDCS Referring Phys: [id number] [place]  1. Left ventricular ejection fraction, by estimation, is 20 to 25%. The left ventricle has severely decreased function. The left ventricle demonstrates global hypokinesis. The left ventricular internal cavity size was mildly dilated.  2. Right ventricular systolic function is normal. The right ventricular size is normal. Tricuspid regurgitation signal is inadequate for assessing PA pressure. There is an RV apical ICD lead. There is also a left bundle lead noted to enter the interventricular septum at the junction of the proximal and mid septum. No flow across the septum is visualized by color doppler.  3. Left atrial size was mildly dilated.  4. The mitral valve is normal in structure. Trivial mitral valve regurgitation. No evidence of mitral stenosis.  5. The aortic valve is tricuspid. There is mild calcification of the aortic valve. Aortic valve regurgitation is not visualized. No aortic stenosis is present.  6. The inferior vena cava is dilated in size with >50% respiratory variability, suggesting right atrial pressure of 8 mmHg.  7. Trivial pericardial effusion. FINDINGS  Left Ventricle: Left ventricular ejection  fraction, by estimation, is 20  to 25%. The left ventricle has severely decreased function. The left ventricle demonstrates global hypokinesis. The left ventricular internal cavity size was mildly dilated. Right Ventricle: The right ventricular size is normal. No increase in right ventricular wall thickness. Right ventricular systolic function is normal. Tricuspid regurgitation signal is inadequate for assessing PA pressure. Left Atrium: Left atrial size was mildly dilated. Right Atrium: Right atrial size was normal in size. Pericardium: Trivial pericardial effusion is present. Mitral Valve: The mitral valve is normal in structure. There is mild calcification of the mitral valve leaflet(s). Trivial mitral valve regurgitation. No evidence of mitral valve stenosis. Tricuspid Valve: The tricuspid valve is normal in structure. Tricuspid valve regurgitation is trivial. Aortic Valve: The aortic valve is tricuspid. There is mild calcification of the aortic valve. Aortic valve regurgitation is not visualized. No aortic stenosis is present. Venous: The inferior vena cava is dilated in size with greater than 50% respiratory variability, suggesting right atrial pressure of 8 mmHg. Additional Comments: A device lead is visualized in the right ventricle. LEFT VENTRICLE PLAX 2D LVIDd:         5.90 cm LVIDs:         5.30 cm LV IVS:        6.10 cm  IVC IVC diam: 2.20 cm AORTIC VALVE LVOT Vmax:   86.60 cm/s LVOT Vmean:  54.100 cm/s LVOT VTI:    0.133 m  SHUNTS Systemic VTI: 0.13 m Dalton McleanMD Electronically signed by Franki Monte Signature Date/Time: 10/29/2021/2:23:14 PM    Final    DG Chest 2 View  Result Date: 10/29/2021 CLINICAL DATA:  Status post lead revision EXAM: CHEST - 2 VIEW COMPARISON:  Yesterday FINDINGS: ICD/pacer from the left with unchanged lead positioning. Pericardial drain wrapping over the left heart. Right IJ line with tip at the right atrium. No pneumothorax. No pleural effusion. IMPRESSION: 1. Right IJ  line with tip at the right atrium. Pericardial drain in place. 2. Unchanged cardiopericardial size for the degree of inflation. No pneumothorax. Electronically Signed   By: Jorje Guild M.D.   On: 10/29/2021 05:31   ECHOCARDIOGRAM LIMITED  Result Date: 10/28/2021    ECHOCARDIOGRAM LIMITED REPORT   Patient Name:   MERVILLE HIJAZI Date of Exam: 10/28/2021 Medical Rec #:  [id number]     Height:       67.8 in Accession #:    [id number]    Weight:       178.6 lb Date of Birth:  1945/05/28      BSA:          1.944 m Patient Age:    57 years      BP:           111/69 mmHg Patient Gender: M             HR:           90 bpm. Exam Location:  Inpatient Procedure: Limited Echo Indications:    I31.3 Pericardial effusion (noninflammatory)  History:        Patient has prior history of Echocardiogram examinations, most                 recent 10/28/2021. Arrythmias:LBBB; Risk Factors:Dyslipidemia.  Sonographer:    Raquel Sarna Senior RDCS Referring Phys: Will Camnitz MD  Sonographer Comments: Tap in cath lab IMPRESSIONS  1. Left ventricular ejection fraction, by estimation, is 20 to 25%. The left ventricle has severely decreased function. The left ventricle demonstrates global hypokinesis.  2. Right ventricular  systolic function was not well visualized.  3. Large pericardial effusion. The pericardial effusion is circumferential. FINDINGS  Left Ventricle: Left ventricular ejection fraction, by estimation, is 20 to 25%. The left ventricle has severely decreased function. The left ventricle demonstrates global hypokinesis. Right Ventricle: Right ventricular systolic function was not well visualized. Pericardium: A large pericardial effusion is present. The pericardial effusion is circumferential. There is diastolic collapse of the right ventricular free wall. Additional Comments: A device lead is visualized. Berniece Salines DO Electronically signed by Berniece Salines DO Signature Date/Time: 10/28/2021/7:40:07 PM    Final    CARDIAC  CATHETERIZATION  Result Date: 10/28/2021 Please see op note dictated separately.  Patient underwent needle pericardiocentesis to treat hemopericardium with tamponade and underwent insertion of a right internal jugular central venous line without immediate complication.   EP PPM/ICD IMPLANT  Result Date: 10/28/2021 SURGEON: Allegra Lai, MD PREPROCEDURE DIAGNOSES: 1. Nonischemic cardiomyopathy. 2. New York Heart Association class III, heart failure chronically. 3. Left bundle-branch block. POSTPROCEDURE DIAGNOSES: 1. Nonischemic cardiomyopathy. 2. 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 3. Left bundle-branch block. PROCEDURES: 1. Left upper extremity venography 2. Biventricular ICD implantation. INTRODUCTION:  Zaccheaus Storlie is a 76 y.o. male with a nonischemic CM (EF 20-25%), NYHA Class III CHF, and LBBB QRS morophology.  He had a CRT-D implanted yesterday and represented to the emergency room with pericardial pain DESCRIPTION OF PROCEDURE: Informed written consent was obtained and the patient was brought to the electrophysiology lab in the fasting state. The patient was adequately sedated with intravenous Versed, and fentanyl as outlined in the nursing report. The patient's left chest was prepped and draped in the usual sterile fashion by the EP lab staff. The skin overlying the left deltopectoral region was infiltrated with lidocaine for local analgesia. A 5-cm incision was made over the left deltopectoral region.  A combination of sharp and blunt dissection was performed through the pre-existing incision to expose the ICD.  The ICD was removed from the pocket.  The ICD lead was disconnected from the ICD. RV Lead Revision: The left axillary vein was cannulated with fluoroscopic visualization.  The setscrew was retracted back into the lead housing.  The lead was retracted into the right atrium.  The lead was a [place] NATFTD [id number] serial number [place] [id number].  The right ventricular  defibrillator lead was advanced with fluoroscopic visualization into the right ventricular apex position. Initial  right ventricular lead R-wave measured 11.3 mV with impedance of 600 ohms and a threshold of 0.5 volts at 0.5 milliseconds. All three leads were secured to the pectoralis fascia using #2 silk suture over the suture sleeves. The pocket then irrigated with copious gentamicin solution. The leads were then connected to an Abbott unify Assura 3375-40 C (serial Number [id number]) device. The defibrillator was placed into the pocket. The pocket was then closed in 3 layers with 2.0 Vicryl suture for the subcutaneous and 3.0 Vicryl suture subcuticular layers. Steri-Strips and a sterile dressing were then applied. DFT testing was not performed today. The procedure was therefore considered completed. EBL<71m. There were no early apparhent complications. Throughout the procedure, the patient complained of chest pain.  When the lead was retracted into the right atrium, the patient began to complain of back pain.  The patient became more diaphoretic throughout the end of the case.  Blood pressures ranged from 70s/40s to 100/70s.  At one point, the patient became minimally responsive.  Due to this, interventional cardiology was called into  the room.  A limited transthoracic echo was performed which showed an enlarged pericardial effusion.  Interventional cardiology performed a pericardiocentesis draining 200 cc of fluid from the pericardium.  The patient's blood pressure recovered to 125/75.  The patient continued to have chest discomfort throughout the case.  Review of the ICD numbers revealed stable threshold, impedance, R wave voltage.  A triple-lumen central line was placed in the right IJ by interventional cardiology. CONCLUSIONS: 1. Nonischemic cardiomyopathy with Left bundle-branch block and chronic New York Heart Association class III heart failure. 2. Successful RV lead revision 3.  Pericardial tamponade with  pericardiocentesis 4.  Right IJ central line placement   CARDIAC CATHETERIZATION  Result Date: 10/28/2021 SURGEON: Allegra Lai, MD PREPROCEDURE DIAGNOSES: 1. Nonischemic cardiomyopathy. 2. New York Heart Association class III, heart failure chronically. 3. Left bundle-branch block. POSTPROCEDURE DIAGNOSES: 1. Nonischemic cardiomyopathy. 2. 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 3. Left bundle-branch block. PROCEDURES: 1. Left upper extremity venography 2. Biventricular ICD implantation. INTRODUCTION:  Burwell Bethel is a 76 y.o. male with a nonischemic CM (EF 20-25%), NYHA Class III CHF, and LBBB QRS morophology.  He had a CRT-D implanted yesterday and represented to the emergency room with pericardial pain DESCRIPTION OF PROCEDURE: Informed written consent was obtained and the patient was brought to the electrophysiology lab in the fasting state. The patient was adequately sedated with intravenous Versed, and fentanyl as outlined in the nursing report. The patient's left chest was prepped and draped in the usual sterile fashion by the EP lab staff. The skin overlying the left deltopectoral region was infiltrated with lidocaine for local analgesia. A 5-cm incision was made over the left deltopectoral region.  A combination of sharp and blunt dissection was performed through the pre-existing incision to expose the ICD.  The ICD was removed from the pocket.  The ICD lead was disconnected from the ICD. RV Lead Revision: The left axillary vein was cannulated with fluoroscopic visualization.  The setscrew was retracted back into the lead housing.  The lead was retracted into the right atrium.  The lead was a [place] SEGBTD 1761 serial number [place] [id number].  The right ventricular defibrillator lead was advanced with fluoroscopic visualization into the right ventricular apex position. Initial  right ventricular lead R-wave measured 11.3 mV with impedance of 600 ohms and a threshold of 0.5  volts at 0.5 milliseconds. All three leads were secured to the pectoralis fascia using #2 silk suture over the suture sleeves. The pocket then irrigated with copious gentamicin solution. The leads were then connected to an Abbott unify Assura 3375-40 C (serial Number [id number]) device. The defibrillator was placed into the pocket. The pocket was then closed in 3 layers with 2.0 Vicryl suture for the subcutaneous and 3.0 Vicryl suture subcuticular layers. Steri-Strips and a sterile dressing were then applied. DFT testing was not performed today. The procedure was therefore considered completed. EBL<81m. There were no early apparhent complications. Throughout the procedure, the patient complained of chest pain.  When the lead was retracted into the right atrium, the patient began to complain of back pain.  The patient became more diaphoretic throughout the end of the case.  Blood pressures ranged from 70s/40s to 100/70s.  At one point, the patient became minimally responsive.  Due to this, interventional cardiology was called into the room.  A limited transthoracic echo was performed which showed an enlarged pericardial effusion.  Interventional cardiology performed a pericardiocentesis draining 200 cc of fluid from the  pericardium.  The patient's blood pressure recovered to 125/75.  The patient continued to have chest discomfort throughout the case.  Review of the ICD numbers revealed stable threshold, impedance, R wave voltage.  A triple-lumen central line was placed in the right IJ by interventional cardiology. CONCLUSIONS: 1. Nonischemic cardiomyopathy with Left bundle-branch block and chronic New York Heart Association class III heart failure. 2. Successful RV lead revision 3.  Pericardial tamponade with pericardiocentesis 4.  Right IJ central line placement   ECHOCARDIOGRAM LIMITED  Result Date: 10/28/2021    ECHOCARDIOGRAM LIMITED REPORT   Patient Name:   BABYBOY LOYA Date of Exam: 10/28/2021 Medical Rec  #:  [id number]           Height:       68.0 in Accession #:    [id number]          Weight:       179.0 lb Date of Birth:  Oct 24, 1945            BSA:          1.950 m Patient Age:    76 years            BP:           122/78 mmHg Patient Gender: M                   HR:           74 bpm. Exam Location:  Inpatient Procedure: Limited Echo, Limited Color Doppler and Cardiac Doppler STAT ECHO Indications:    chest pain post ICD placement  History:        Patient has prior history of Echocardiogram examinations, most                 recent 10/06/2020. Defibrillator and Pacemaker, Arrythmias:LBBB;                 Signs/Symptoms:Shortness of Breath.  Sonographer:    Johny Chess RDCS Referring Phys: [id number] [place]  1. Left ventricular ejection fraction, by estimation, is 20 to 25%. The left ventricle has severely decreased function. The left ventricle demonstrates global hypokinesis.  2. Right ventricular systolic function was not well visualized. The right ventricular size is normal. There is normal pulmonary artery systolic pressure.  3. Device lead noted.  4. Small pericardial effusion localized anterior of the Right ventricle. The right ventricle does not fully relax suggesting there may be a slight increase in intrapericardial pressure but there are no objective findings of cardiac tamponande. If clinical picture depicts repeat echo may be considered in 24 hr with doppler. There is no evidence of cardiac tamponade.  5. The mitral valve is normal in structure. Trivial mitral valve regurgitation. No evidence of mitral stenosis.  6. The aortic valve is tricuspid. There is mild calcification of the aortic valve. Aortic valve regurgitation is not visualized. Aortic valve sclerosis is present, with no evidence of aortic valve stenosis.  7. The inferior vena cava is normal in size with greater than 50% respiratory variability, suggesting right atrial pressure of 3 mmHg. FINDINGS  Left Ventricle: Left  ventricular ejection fraction, by estimation, is 20 to 25%. The left ventricle has severely decreased function. The left ventricle demonstrates global hypokinesis. The left ventricular internal cavity size was normal in size. There is no left ventricular hypertrophy. Right Ventricle: Right ventricle does not relax fully. The right ventricular size is normal. Right vetricular wall thickness was not assessed. Right  ventricular systolic function was not well visualized. There is normal pulmonary artery systolic pressure. The tricuspid regurgitant velocity is 1.62 m/s, and with an assumed right atrial pressure of 3 mmHg, the estimated right ventricular systolic pressure is 94.1 mmHg. Left Atrium: Left atrial size was not assessed. Right Atrium: Right atrial size was not assessed. Device lead noted. Pericardium: Small pericardial effusion localized anterior of the Right ventricle. The right ventricle does not fully relax suggesting there may be a slight increase in intrapericardial pressure but there are no objective findings of cardiac tamponande. If clinical picture depicts repeat echo may be considered in 24 hr with doppler. There is no evidence of cardiac tamponade. Presence of epicardial fat layer. Mitral Valve: The mitral valve is normal in structure. Trivial mitral valve regurgitation. No evidence of mitral valve stenosis. Tricuspid Valve: The tricuspid valve is normal in structure. Tricuspid valve regurgitation is mild . No evidence of tricuspid stenosis. Aortic Valve: The aortic valve is tricuspid. There is mild calcification of the aortic valve. Aortic valve regurgitation is not visualized. Aortic valve sclerosis is present, with no evidence of aortic valve stenosis. Pulmonic Valve: The pulmonic valve was not well visualized. Pulmonic valve regurgitation is not visualized. No evidence of pulmonic stenosis. Aorta: The aortic root is normal in size and structure. Venous: The inferior vena cava is normal in size  with greater than 50% respiratory variability, suggesting right atrial pressure of 3 mmHg. IAS/Shunts: No atrial level shunt detected by color flow Doppler. Additional Comments: A device lead is visualized. LEFT VENTRICLE PLAX 2D LVIDd:         5.50 cm LVIDs:         5.10 cm LV PW:         1.00 cm LV IVS:        1.00 cm  LEFT ATRIUM         Index LA diam:    3.10 cm 1.59 cm/m   AORTA Ao Asc diam: 3.00 cm TRICUSPID VALVE TR Peak grad:   10.5 mmHg TR Vmax:        162.00 cm/s Kardie Tobb DO Electronically signed by Berniece Salines DO Signature Date/Time: 10/28/2021/9:41:30 AM    Final       Assessment & Plan    76yo man with NICM, chronic systolic heart failure, LBBB who was admitted after ICD implant with a microperforation. During revision of RV lead, patient developed a pericardial effusion and tamponade requiring pericardiocentesis. His drain has now been removed.  #Pericardial Effusion Improved. Hemodynamically stable on levophed 2. Repeat limited echo today to assess for pericardial effusion given AM hypotensive episode. Maintain ICU status  #AT Continue IV amiodarone for suppression and to maintain pacing.  #Chronic systolic heart failure Warm. On IV Levo. Wean levo as tolerated. Limited echo as above.  Maintain ICU status.   For questions or updates, please contact [place] Please consult [email or web address] for contact info under        Signed, Vickie Epley, MD  10/30/2021, 7:57 AM     CRITICAL CARE Performed by: Vickie Epley   Total critical care time: 35 minutes  Critical care time was exclusive of separately billable procedures and treating other patients.  Critical care was necessary to treat or prevent imminent or life-threatening deterioration.  Critical care was time spent personally by me on the following activities: development of treatment plan with patient and/or surrogate as well as nursing, discussions with consultants, evaluation of patient's  response to treatment, examination  of patient, obtaining history from patient or surrogate, ordering and performing treatments and interventions, ordering and review of laboratory studies, ordering and review of radiographic studies, pulse oximetry and re-evaluation of patient's condition.

## 2021-10-30 NOTE — Significant Event (Signed)
Notified about hypertension.  I was immediately available to the patient at bedside.  Patient is noted to be diaphoretic.  MAP in 50s.  Heart rates in mid 90s.  Patient is complaining of chest pain.  Point-of-care ultrasound shows no pericardial effusion, markedly dilated left ventricle with relatively preserved RV size and function.  Portable chest x-ray showed no evidence of pneumothorax.  Patient was immediately started on levo steady improvement in blood pressure.  Blood work is remarkable for worsening AKI.  Initial CVP was 19.  Moreover mixed venous-showed 36% mixed venous saturation.  Patient was started on dobutamine.  A-line was placed to guide hemodynamic management. Significant improvement shortly after; and about 2 hours after initiation patient is off levo.  Repeat mixed venous is 81%. Significant discrepancy noted with between mixed venous considerations. Plan Continue with Amio drip. Follow-up repeat mixed venous. Given significance with Foley placement follow-up urology consult for assistance. Continue with Lasix 60 mg IV once and assess response

## 2021-10-31 ENCOUNTER — Inpatient Hospital Stay (HOSPITAL_COMMUNITY): Payer: Medicare HMO

## 2021-10-31 DIAGNOSIS — I3139 Other pericardial effusion (noninflammatory): Secondary | ICD-10-CM

## 2021-10-31 DIAGNOSIS — R0781 Pleurodynia: Secondary | ICD-10-CM | POA: Diagnosis not present

## 2021-10-31 LAB — BASIC METABOLIC PANEL
Anion gap: 7 (ref 5–15)
BUN: 20 mg/dL (ref 8–23)
CO2: 24 mmol/L (ref 22–32)
Calcium: 8.7 mg/dL — ABNORMAL LOW (ref 8.9–10.3)
Chloride: 103 mmol/L (ref 98–111)
Creatinine, Ser: 1.06 mg/dL (ref 0.61–1.24)
GFR, Estimated: >60 mL/min (ref >60)
Glucose, Bld: 134 mg/dL — ABNORMAL HIGH (ref 70–99)
Potassium: 3.8 mmol/L (ref 3.5–5.1)
Sodium: 134 mmol/L — ABNORMAL LOW (ref 135–145)

## 2021-10-31 LAB — CBC
HCT: 29.4 % — ABNORMAL LOW (ref 39.0–52.0)
Hemoglobin: 9.8 g/dL — ABNORMAL LOW (ref 13.0–17.0)
MCH: 29.8 pg (ref 26.0–34.0)
MCHC: 33.3 g/dL (ref 30.0–36.0)
MCV: 89.4 fL (ref 80.0–100.0)
Platelets: 95 10*3/uL — ABNORMAL LOW (ref 150–400)
RBC: 3.29 MIL/uL — ABNORMAL LOW (ref 4.22–5.81)
RDW: 13.6 % (ref 11.5–15.5)
WBC: 8 10*3/uL (ref 4.0–10.5)
nRBC: 0 % (ref 0.0–0.2)

## 2021-10-31 LAB — ECHOCARDIOGRAM LIMITED

## 2021-10-31 LAB — MAGNESIUM: Magnesium: 2.1 mg/dL (ref 1.7–2.4)

## 2021-10-31 MED ORDER — DIGOXIN 0.25 MG/ML IJ SOLN
0.2500 mg | Freq: Once | INTRAMUSCULAR | Status: AC
  Administered 2021-10-31: 0.25 mg via INTRAVENOUS
  Filled 2021-10-31: qty 2

## 2021-10-31 MED ORDER — AMIODARONE HCL IN DEXTROSE 360-4.14 MG/200ML-% IV SOLN
30.0000 mg/h | INTRAVENOUS | Status: DC
  Administered 2021-10-31 – 2021-11-01 (×5): 30 mg/h via INTRAVENOUS
  Filled 2021-10-31 (×5): qty 200

## 2021-10-31 MED ORDER — AMIODARONE IV BOLUS ONLY 150 MG/100ML: 150.0000 mg | Freq: Once | INTRAVENOUS | Status: DC

## 2021-10-31 MED ORDER — AMIODARONE LOAD VIA INFUSION
150.0000 mg | Freq: Once | INTRAVENOUS | Status: DC
  Filled 2021-10-31: qty 83.34

## 2021-10-31 MED ORDER — POTASSIUM CHLORIDE CRYS ER 10 MEQ PO TBCR
20.0000 meq | EXTENDED_RELEASE_TABLET | Freq: Once | ORAL | Status: AC
  Administered 2021-10-31: 20 meq via ORAL
  Filled 2021-10-31: qty 2

## 2021-10-31 MED ORDER — DIGOXIN 0.25 MG/ML IJ SOLN
0.1250 mg | Freq: Once | INTRAMUSCULAR | Status: AC
  Administered 2021-10-31: 0.125 mg via INTRAVENOUS
  Filled 2021-10-31: qty 2

## 2021-10-31 NOTE — Progress Notes (Signed)
  Echocardiogram 2D Echocardiogram has been performed.  [name] [name] 10/31/2021, 8:59 AM

## 2021-10-31 NOTE — Progress Notes (Signed)
EP Progress Note  Patient Name: [name] [name] Date of Encounter: 10/31/2021  [facility] Cardiologist: Glenetta Hew, MD   Subjective   BP have improved. Off norepi. HR fluctuating while in AF, up to 150s during assessment this AM. Wife at bedside.  Inpatient Medications    Scheduled Meds:  amiodarone  150 mg Intravenous Once   Chlorhexidine Gluconate Cloth  6 each Topical Daily   colchicine  0.6 mg Oral BID   digoxin  0.125 mg Intravenous Once   digoxin  0.25 mg Intravenous Once   lidocaine (PF)  10 mL Infiltration Once   sodium chloride flush  3 mL Intravenous Q12H   Continuous Infusions:  sodium chloride Stopped (10/29/21 0425)   sodium chloride     sodium chloride     amiodarone 30 mg/hr (10/31/21 0600)   norepinephrine (LEVOPHED) Adult infusion     PRN Meds: Place/Maintain arterial line **AND** sodium chloride, Place/Maintain arterial line **AND** sodium chloride, Place/Maintain arterial line **AND** sodium chloride, acetaminophen, fentaNYL (SUBLIMAZE) injection, Heparin (Porcine) in NaCl, ketorolac, morphine injection, ondansetron (ZOFRAN) IV, mouth rinse   Vital Signs    Vitals:   10/31/21 0300 10/31/21 0400 10/31/21 0500 10/31/21 0600  BP: 116/66 (!) 111/55 (!) 124/58 117/64  Pulse: 83 86 88 84  Resp: 16 (!) 22 (!) 24 13  Temp:      TempSrc:      SpO2: 95% 96% (!) 83% 96%    Intake/Output Summary (Last 24 hours) at 10/31/2021 0754 Last data filed at 10/31/2021 0600 Gross per 24 hour  Intake 783.7 ml  Output 1910 ml  Net -1126.3 ml       10/27/2021    8:21 AM 10/01/2021    8:51 AM 09/22/2021    8:34 AM  Last 3 Weights  Weight (lbs) 179 lb 185 lb 184 lb  Weight (kg) 81.194 kg 83.915 kg 83.462 kg      Telemetry    Personally Reviewed  ECG    Personally Reviewed  Physical Exam   GEN: No acute distress.   Neck: No JVD Cardiac: Irregularly irregular, no murmurs, rubs, or gallops.  Respiratory: Clear to auscultation bilaterally. GI:  Soft, nontender, non-distended  MS: No edema; No deformity. Neuro:  Nonfocal  Psych: Normal affect   Labs    High Sensitivity Troponin:   Recent Labs  Lab 10/28/21 0448 10/28/21 0636 10/29/21 2340 10/30/21 0524  TROPONINIHS 154* 135* 63* 44*      Chemistry Recent Labs  Lab 10/29/21 0615 10/29/21 2323 10/30/21 0524 10/31/21 0319  NA 137 133* 133* 134*  K 4.5 4.9 4.1 3.8  CL 106 105 105 103  CO2 22 20* 24 24  GLUCOSE 171* 217* 171* 134*  BUN 23 32* 29* 20  CREATININE 1.09 1.68* 1.25* 1.06  CALCIUM 8.9 8.9 8.4* 8.7*  MG  --  2.1  --  2.1  PROT 6.2*  --   --   --   ALBUMIN 3.4*  --   --   --   AST 19  --   --   --   ALT 16  --   --   --   ALKPHOS 51  --   --   --   BILITOT 0.8  --   --   --   GFRNONAA >60 42* 59* >60  ANIONGAP 9 8 4* 7     Lipids No results for input(s): "CHOL", "TRIG", "HDL", "LABVLDL", "LDLCALC", "CHOLHDL" in the last 168 hours.  Hematology Recent Labs  Lab 10/29/21 2346 10/30/21 0524 10/31/21 0319  WBC 12.0* 11.7* 8.0  RBC 3.56* 3.30* 3.29*  HGB 10.9* 10.1* 9.8*  HCT 32.8* 30.1* 29.4*  MCV 92.1 91.2 89.4  MCH 30.6 30.6 29.8  MCHC 33.2 33.6 33.3  RDW 13.7 13.5 13.6  PLT 108* 94* 95*    Thyroid No results for input(s): "TSH", "FREET4" in the last 168 hours.  BNPNo results for input(s): "BNP", "PROBNP" in the last 168 hours.  DDimer No results for input(s): "DDIMER" in the last 168 hours.   Radiology    ECHOCARDIOGRAM LIMITED  Result Date: 10/30/2021    ECHOCARDIOGRAM LIMITED REPORT   Patient Name:   [name] [name] Date of Exam: 10/30/2021 Medical Rec #:  [id number]           Height:       68.0 in Accession #:    [id number]          Weight:       179.0 lb Date of Birth:  Jun 11, 1945            BSA:          1.950 m Patient Age:    76 years            BP:           110/75 mmHg Patient Gender: M                   HR:           89 bpm. Exam Location:  Inpatient Procedure: Color Doppler and Limited Echo Indications:    Pericardial effusion   History:        Patient has prior history of Echocardiogram examinations, most                 recent 10/29/2021. Cardiomyopathy; Defibrillator.  Sonographer:    Clayton Lefort RDCS (AE) Referring Phys: Vickie Epley  Sonographer Comments: Bandages covering subcostal window. IMPRESSIONS  1. Trace pericardial fluid.  2. There is no evidence of cardiac tamponade.  3. Left ventricular ejection fraction, by estimation, is 20 to 25%. The left ventricle has severely decreased function. The left ventricle demonstrates global hypokinesis.  4. There is moderate calcification of the aortic valve. Aortic valve regurgitation is not visualized.  5. Right ventricular systolic function is normal. The right ventricular size is normal. FINDINGS  Left Ventricle: Left ventricular ejection fraction, by estimation, is 20 to 25%. The left ventricle has severely decreased function. The left ventricle demonstrates global hypokinesis. Right Ventricle: The right ventricular size is normal. Right ventricular systolic function is normal. Pericardium: Trivial pericardial effusion is present. There is no evidence of cardiac tamponade. Aortic Valve: There is moderate calcification of the aortic valve. Aortic valve regurgitation is not visualized. Cherlynn Kaiser MD Electronically signed by Cherlynn Kaiser MD Signature Date/Time: 10/30/2021/9:10:20 AM    Final    DG CHEST PORT 1 VIEW  Result Date: 10/29/2021 CLINICAL DATA:  Chest pain EXAM: PORTABLE CHEST 1 VIEW COMPARISON:  4:39 a.m. FINDINGS: Right internal jugular central venous catheter with its tip within the right atrium is unchanged. Pericardial drain is not well visualized and may have been removed in the interval. Lungs are clear. No pneumothorax or pleural effusion. Cardiac size is within normal limits. Left subclavian pacemaker defibrillator is unchanged. Pulmonary vascularity is normal. No acute bone abnormality. IMPRESSION: Suspect interval removal of pericardial drain. Unchanged  cardiac size. Electronically Signed   By: Cassandria Anger  Christa See M.D.   On: 10/29/2021 23:49   ECHOCARDIOGRAM LIMITED  Result Date: 10/29/2021    ECHOCARDIOGRAM LIMITED REPORT   Patient Name:   [name] [name] Date of Exam: 10/29/2021 Medical Rec #:  [id number]           Height:       68.0 in Accession #:    [id number]          Weight:       179.0 lb Date of Birth:  Jan 10, 1946            BSA:          1.950 m Patient Age:    62 years            BP:           121/68 mmHg Patient Gender: M                   HR:           94 bpm. Exam Location:  Inpatient Procedure: Limited Color Doppler and Limited Echo Indications:    pericardial effusion  History:        Patient has prior history of Echocardiogram examinations, most                 recent 10/28/2021. Cardiomyopathy, Defibrillator and Pacemaker;                 Signs/Symptoms:Chest Pain.  Sonographer:    Johny Chess RDCS Referring Phys: [id number] [place]  1. Left ventricular ejection fraction, by estimation, is 20 to 25%. The left ventricle has severely decreased function. The left ventricle demonstrates global hypokinesis. The left ventricular internal cavity size was mildly dilated.  2. Right ventricular systolic function is normal. The right ventricular size is normal. Tricuspid regurgitation signal is inadequate for assessing PA pressure. There is an RV apical ICD lead. There is also a left bundle lead noted to enter the interventricular septum at the junction of the proximal and mid septum. No flow across the septum is visualized by color doppler.  3. Left atrial size was mildly dilated.  4. The mitral valve is normal in structure. Trivial mitral valve regurgitation. No evidence of mitral stenosis.  5. The aortic valve is tricuspid. There is mild calcification of the aortic valve. Aortic valve regurgitation is not visualized. No aortic stenosis is present.  6. The inferior vena cava is dilated in size with >50% respiratory variability,  suggesting right atrial pressure of 8 mmHg.  7. Trivial pericardial effusion. FINDINGS  Left Ventricle: Left ventricular ejection fraction, by estimation, is 20 to 25%. The left ventricle has severely decreased function. The left ventricle demonstrates global hypokinesis. The left ventricular internal cavity size was mildly dilated. Right Ventricle: The right ventricular size is normal. No increase in right ventricular wall thickness. Right ventricular systolic function is normal. Tricuspid regurgitation signal is inadequate for assessing PA pressure. Left Atrium: Left atrial size was mildly dilated. Right Atrium: Right atrial size was normal in size. Pericardium: Trivial pericardial effusion is present. Mitral Valve: The mitral valve is normal in structure. There is mild calcification of the mitral valve leaflet(s). Trivial mitral valve regurgitation. No evidence of mitral valve stenosis. Tricuspid Valve: The tricuspid valve is normal in structure. Tricuspid valve regurgitation is trivial. Aortic Valve: The aortic valve is tricuspid. There is mild calcification of the aortic valve. Aortic valve regurgitation is not visualized. No aortic stenosis is present. Venous: The inferior vena  cava is dilated in size with greater than 50% respiratory variability, suggesting right atrial pressure of 8 mmHg. Additional Comments: A device lead is visualized in the right ventricle. LEFT VENTRICLE PLAX 2D LVIDd:         5.90 cm LVIDs:         5.30 cm LV IVS:        6.10 cm  IVC IVC diam: 2.20 cm AORTIC VALVE LVOT Vmax:   86.60 cm/s LVOT Vmean:  54.100 cm/s LVOT VTI:    0.133 m  SHUNTS Systemic VTI: 0.13 m Dalton McleanMD Electronically signed by Franki Monte Signature Date/Time: 10/29/2021/2:23:14 PM    Final       Assessment & Plan    76yo man with NICM, chronic systolic heart failure, LBBB who was admitted after ICD implant with a microperforation. During revision of RV lead, patient developed a pericardial effusion and  tamponade requiring pericardiocentesis. His drain has now been removed.  #Pericardial Effusion Improved. Repeat limited echo yesterday w trivial effusion.  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 Holding AC given recent [place]. Add digoxin today with IV load. Plan to start oral tomorrow.  #Chronic systolic heart failure EF severely reduced.  Maintain ICU status.   For questions or updates, please contact [place] Please consult [email or web address] for contact info under        Signed, Vickie Epley, MD  10/31/2021, 7:54 AM    CRITICAL CARE Performed by: Vickie Epley   Total critical care time: 35 minutes  Critical care time was exclusive of separately billable procedures and treating other patients.  Critical care was necessary to treat or prevent imminent or life-threatening deterioration.  Critical care was time spent personally by me on the following activities: development of treatment plan with patient and/or surrogate as well as nursing, discussions with consultants, evaluation of patient's response to treatment, examination of patient, obtaining history from patient or surrogate, ordering and performing treatments and interventions, ordering and review of laboratory studies, ordering and review of radiographic studies, pulse oximetry and re-evaluation of patient's condition.

## 2021-11-01 DIAGNOSIS — R0781 Pleurodynia: Secondary | ICD-10-CM | POA: Diagnosis not present

## 2021-11-01 LAB — BASIC METABOLIC PANEL
Anion gap: 14 (ref 5–15)
BUN: 19 mg/dL (ref 8–23)
CO2: 24 mmol/L (ref 22–32)
Calcium: 9.3 mg/dL (ref 8.9–10.3)
Chloride: 103 mmol/L (ref 98–111)
Creatinine, Ser: 0.94 mg/dL (ref 0.61–1.24)
GFR, Estimated: >60 mL/min (ref >60)
Glucose, Bld: 120 mg/dL — ABNORMAL HIGH (ref 70–99)
Potassium: 3.9 mmol/L (ref 3.5–5.1)
Sodium: 141 mmol/L (ref 135–145)

## 2021-11-01 LAB — BPAM RBC
Blood Product Expiration Date: 202308292359
Blood Product Expiration Date: 202309012359
Unit Type and Rh: 6200
Unit Type and Rh: 6200

## 2021-11-01 LAB — TYPE AND SCREEN
ABO/RH(D): A POS
Antibody Screen: NEGATIVE
Unit division: 0
Unit division: 0

## 2021-11-01 LAB — CBC
HCT: 32 % — ABNORMAL LOW (ref 39.0–52.0)
Hemoglobin: 11 g/dL — ABNORMAL LOW (ref 13.0–17.0)
MCH: 30.7 pg (ref 26.0–34.0)
MCHC: 34.4 g/dL (ref 30.0–36.0)
MCV: 89.4 fL (ref 80.0–100.0)
Platelets: 128 10*3/uL — ABNORMAL LOW (ref 150–400)
RBC: 3.58 MIL/uL — ABNORMAL LOW (ref 4.22–5.81)
RDW: 13.5 % (ref 11.5–15.5)
WBC: 6.9 10*3/uL (ref 4.0–10.5)
nRBC: 0 % (ref 0.0–0.2)

## 2021-11-01 MED ORDER — DIGOXIN 125 MCG PO TABS
0.1250 mg | ORAL_TABLET | Freq: Every day | ORAL | Status: DC
  Administered 2021-11-01 – 2021-11-02 (×2): 0.125 mg via ORAL
  Filled 2021-11-01 (×2): qty 1

## 2021-11-01 NOTE — Progress Notes (Addendum)
Electrophysiology Rounding Note  Patient Name: [name] [name] Date of Encounter: 11/01/2021  Primary Cardiologist: Glenetta Hew, MD Electrophysiologist: None   Subjective   The patient is doing well today.  At this time, the patient denies chest pain, shortness of breath, or any new concerns.  Inpatient Medications    Scheduled Meds:  amiodarone  150 mg Intravenous Once   Chlorhexidine Gluconate Cloth  6 each Topical Daily   colchicine  0.6 mg Oral BID   lidocaine (PF)  10 mL Infiltration Once   sodium chloride flush  3 mL Intravenous Q12H   Continuous Infusions:  sodium chloride Stopped (10/29/21 0425)   sodium chloride     sodium chloride     amiodarone 30 mg/hr (11/01/21 0500)   norepinephrine (LEVOPHED) Adult infusion     PRN Meds: Place/Maintain arterial line **AND** sodium chloride, Place/Maintain arterial line **AND** sodium chloride, Place/Maintain arterial line **AND** sodium chloride, acetaminophen, fentaNYL (SUBLIMAZE) injection, Heparin (Porcine) in NaCl, ketorolac, morphine injection, ondansetron (ZOFRAN) IV, mouth rinse   Vital Signs    Vitals:   11/01/21 0000 11/01/21 0200 11/01/21 0300 11/01/21 0400  BP: 131/61 (!) 120/54 121/66 128/68  Pulse: 79 76 80 72  Resp: (!) '23 17 19 19  '$ Temp:      TempSrc:      SpO2: 95% 95% 95% 95%    Intake/Output Summary (Last 24 hours) at 11/01/2021 0706 Last data filed at 11/01/2021 0500 Gross per 24 hour  Intake 365.82 ml  Output 800 ml  Net -434.18 ml   There were no vitals filed for this visit.  Physical Exam    GEN- The patient is well appearing, alert and oriented x 3 today.   Head- normocephalic, atraumatic Eyes-  Sclera clear, conjunctiva pink Ears- hearing intact Oropharynx- clear Neck- supple Lungs- Clear to ausculation bilaterally, normal work of breathing Heart- Regular rate and rhythm, no murmurs, rubs or gallops GI- soft, NT, ND, + BS Extremities- no clubbing or cyanosis. No edema Skin-  no rash or lesion Psych- euthymic mood, full affect Neuro- strength and sensation are intact  Labs    CBC Recent Labs    10/31/21 0319 11/01/21 0449  WBC 8.0 6.9  HGB 9.8* 11.0*  HCT 29.4* 32.0*  MCV 89.4 89.4  PLT 95* 010*   Basic Metabolic Panel Recent Labs    10/29/21 2323 10/30/21 0524 10/31/21 0319 11/01/21 0449  NA 133*   < > 134* 141  K 4.9   < > 3.8 3.9  CL 105   < > 103 103  CO2 20*   < > 24 24  GLUCOSE 217*   < > 134* 120*  BUN 32*   < > 20 19  CREATININE 1.68*   < > 1.06 0.94  CALCIUM 8.9   < > 8.7* 9.3  MG 2.1  --  2.1  --    < > = values in this interval not displayed.   Liver Function Tests No results for input(s): "AST", "ALT", "ALKPHOS", "BILITOT", "PROT", "ALBUMIN" in the last 72 hours. No results for input(s): "LIPASE", "AMYLASE" in the last 72 hours. Cardiac Enzymes No results for input(s): "CKTOTAL", "CKMB", "CKMBINDEX", "TROPONINI" in the last 72 hours.   Telemetry    V pacing at 80 (personally reviewed)  Radiology    ECHOCARDIOGRAM LIMITED  Result Date: 10/31/2021    ECHOCARDIOGRAM LIMITED REPORT   Patient Name:   [name] [name] Date of Exam: 10/31/2021 Medical Rec #:  [id number]  Height:       68.0 in Accession #:    [id number]          Weight:       179.0 lb Date of Birth:  07-03-1945            BSA:          1.950 m Patient Age:    69 years            BP:           117/64 mmHg Patient Gender: M                   HR:           101 bpm. Exam Location:  Inpatient Procedure: Color Doppler and Limited Echo Indications:    Pericardial effusion  History:        Patient has prior history of Echocardiogram examinations, most                 recent 10/30/2021. Cardiomyopathy; Defibrillator.  Sonographer:    Clayton Lefort RDCS (AE) Referring Phys: [place]  Sonographer Comments: Bandages covering subcostal window. IMPRESSIONS  1. Limited study to R/O pericardial effusion; full doppler not performed.  2. Left ventricular ejection fraction, by  estimation, is 25 to 30%. The left ventricle has severely decreased function. The left ventricle demonstrates global hypokinesis. The left ventricular internal cavity size was mildly dilated. There is mild left ventricular hypertrophy.  3. Right ventricular systolic function is normal. The right ventricular size is normal.  4. The mitral valve is normal in structure.  5. The aortic valve is tricuspid. Aortic valve sclerosis is present, with no evidence of aortic valve stenosis. FINDINGS  Left Ventricle: Left ventricular ejection fraction, by estimation, is 25 to 30%. The left ventricle has severely decreased function. The left ventricle demonstrates global hypokinesis. The left ventricular internal cavity size was mildly dilated. There is mild left ventricular hypertrophy. Right Ventricle: The right ventricular size is normal. Right ventricular systolic function is normal. Left Atrium: Left atrial size was normal in size. Right Atrium: Right atrial size was normal in size. Pericardium: Trivial pericardial effusion is present. Mitral Valve: The mitral valve is normal in structure. Mild mitral annular calcification. Tricuspid Valve: The tricuspid valve is normal in structure. Aortic Valve: The aortic valve is tricuspid. Aortic valve sclerosis is present, with no evidence of aortic valve stenosis. Aorta: The aortic root is normal in size and structure. Additional Comments: Limited study to R/O pericardial effusion; full doppler not performed. A device lead is visualized. Kirk Ruths MD Electronically signed by Kirk Ruths MD Signature Date/Time: 10/31/2021/9:10:03 AM    Final    ECHOCARDIOGRAM LIMITED  Result Date: 10/30/2021    ECHOCARDIOGRAM LIMITED REPORT   Patient Name:   [name] [name] Date of Exam: 10/30/2021 Medical Rec #:  [id number]           Height:       68.0 in Accession #:    [id number]          Weight:       179.0 lb Date of Birth:  1945-06-13            BSA:          1.950 m Patient Age:    60  years            BP:           110/75 mmHg Patient Gender: M  HR:           89 bpm. Exam Location:  Inpatient Procedure: Color Doppler and Limited Echo Indications:    Pericardial effusion  History:        Patient has prior history of Echocardiogram examinations, most                 recent 10/29/2021. Cardiomyopathy; Defibrillator.  Sonographer:    Clayton Lefort RDCS (AE) Referring Phys: Vickie Epley  Sonographer Comments: Bandages covering subcostal window. IMPRESSIONS  1. Trace pericardial fluid.  2. There is no evidence of cardiac tamponade.  3. Left ventricular ejection fraction, by estimation, is 20 to 25%. The left ventricle has severely decreased function. The left ventricle demonstrates global hypokinesis.  4. There is moderate calcification of the aortic valve. Aortic valve regurgitation is not visualized.  5. Right ventricular systolic function is normal. The right ventricular size is normal. FINDINGS  Left Ventricle: Left ventricular ejection fraction, by estimation, is 20 to 25%. The left ventricle has severely decreased function. The left ventricle demonstrates global hypokinesis. Right Ventricle: The right ventricular size is normal. Right ventricular systolic function is normal. Pericardium: Trivial pericardial effusion is present. There is no evidence of cardiac tamponade. Aortic Valve: There is moderate calcification of the aortic valve. Aortic valve regurgitation is not visualized. Cherlynn Kaiser MD Electronically signed by Cherlynn Kaiser MD Signature Date/Time: 10/30/2021/9:10:20 AM    Final     Patient Profile     76yo man with NICM, chronic systolic heart failure, LBBB who was admitted after ICD implant with a microperforation. During revision of RV lead, patient developed a pericardial effusion and tamponade requiring pericardiocentesis. His drain has now been removed.  Assessment & Plan    Pericardial Effusion Improved. Echo 10/31/2021 with only trivial effusion EF  ~25-30%   2. AT/AF Remains on IV amiodarone for suppression and to maintain pacing. Holding [facility] given recent Perf. Continue digoxin (added yesterday). Will need level in 7-10 days as outpatient.  Will discuss transition to po amio with Dr. Lovena Le.    3. Chronic systolic heart failure EF severely reduced. Will plan to add back BB as tolerated. Had swelling previously on ACEi. ? Low dose ARB  Will discuss with MD if plan is for home today vs convert to pos and observe 24 additional hours.   For questions or updates, please contact [place] Please consult [email or web address] for contact info under Cardiology/STEMI.  Signed, Shirley Friar, PA-C  11/01/2021, 7:06 AM   EP Attending  Patient seen and examined. Agree with the findings as noted above. The patient is improved today. Chest pain is resolved and his bp is back to baseline. He had some brief atrial fib yesterday and is still on IV amio which we will continue for today and switch to po tomorrow. Stop the art line and central line. DC home tomorrow PM or Wednesday afternoon.  Carleene Overlie Margert Edsall,MD

## 2021-11-02 DIAGNOSIS — I4891 Unspecified atrial fibrillation: Secondary | ICD-10-CM | POA: Insufficient documentation

## 2021-11-02 DIAGNOSIS — I314 Cardiac tamponade: Secondary | ICD-10-CM | POA: Diagnosis not present

## 2021-11-02 MED ORDER — COLCHICINE 0.6 MG PO TABS: 0.6000 mg | ORAL_TABLET | Freq: Two times a day (BID) | ORAL | 0 refills | Status: DC

## 2021-11-02 MED ORDER — AMIODARONE HCL 200 MG PO TABS: 200.0000 mg | ORAL_TABLET | Freq: Two times a day (BID) | ORAL | 3 refills | Status: DC

## 2021-11-02 MED ORDER — DIGOXIN 125 MCG PO TABS: 0.1250 mg | ORAL_TABLET | Freq: Every day | ORAL | 6 refills | Status: DC

## 2021-11-02 MED ORDER — AMIODARONE HCL 200 MG PO TABS
200.0000 mg | ORAL_TABLET | Freq: Two times a day (BID) | ORAL | Status: DC
  Administered 2021-11-02: 200 mg via ORAL
  Filled 2021-11-02: qty 1

## 2021-11-02 NOTE — Discharge Summary (Addendum)
ELECTROPHYSIOLOGY PROCEDURE DISCHARGE SUMMARY    Patient ID: [name] [name],  MRN: [id number], DOB/AGE: 77/07/1945 77 y.o.  Admit date: 10/28/2021 Discharge date: 11/02/2021  Primary Care Physician: [name], MD  Primary Cardiologist: Glenetta Hew, MD  Electrophysiologist: Cristopher Peru, MD   Primary Discharge Diagnosis:  CRT microlead perforation Pericardial effusion Chest pain  Secondary Discharge Diagnosis:  NICM Chronic systolic CHF Left bundle branch block  Allergies  Allergen Reactions   Nitrostat [Nitroglycerin] Anaphylaxis    "Cardiologist suggested he shouldn't take this med because it could kill him with his heart condition. Lowers BP"   Ace Inhibitors Swelling   Mevacor [Lovastatin] Nausea Only   Solarcaine [Benzocaine] Dermatitis     Procedures This Admission:  1.  Revision of a St Jude device that had RV ICD lead microperforation. Previously implanted the day prior, on 10/27/2021.  During the procedure pt had escalating pain and hypotension, noted to have large pericardial effusion > tamponade 2. Emergent pericardial drain 10/28/2021    Multiple echos, see CV procedure Most recent 10/31/2021 showed only trivial pericardial effusion   Brief HPI: [name] [name] is a 76 y.o. male was admitted with sudden onset of sharp "terrible" chest pain after ICD implantation 10/27/2021.  Work up showed marked change in RV ICD lead threshold.   Hospital Course:  The patient was admitted and underwent revision of RV lead with microperforation. During the procedure, pt had worsening vitals and developed tamponade requiring urgent placement of cardiac drain and central line placement. 200 mL of bloody fluid was drained. Started on colchicine. Drain removed without complication 06/02/8586. Amiodarone started for a WCT felt to be ST/AT with native LBBB. On evening of 8/6 pt had rapid AF and was bolused on amiodarone. Pt rhythm remained quiescent and echo that evening  also showed no-re accumulation of effusion. Left chest was without hematoma or ecchymosis.  The patient was examined and considered stable for discharge to home. With close follow up as above.   He will complete 6 week course of colchicine.   Anticoagulation  With recent effusion, will only plan to start [facility] if he has more AF noted on device interrogation.   Physical Exam: Vitals:   11/01/21 2333 11/01/21 2352 11/02/21 0436 11/02/21 0810  BP: 124/74 117/65 124/72   Pulse: 77 77 84 77  Resp: '16 13 15 15  '$ Temp: 97.8 F (36.6 C) 97.7 F (36.5 C) 97.9 F (36.6 C) 97.8 F (36.6 C)  TempSrc: Oral Oral Oral Oral  SpO2: 97% 94% 96% 97%    GEN- The patient is well appearing, alert and oriented x 3 today.   HEENT: normocephalic, atraumatic; sclera clear, conjunctiva pink; hearing intact; oropharynx clear; neck supple, no JVP Lymph- no cervical lymphadenopathy Lungs- Clear to ausculation bilaterally, normal work of breathing.  No wheezes, rales, rhonchi Heart- Regular rate and rhythm, no murmurs, rubs or gallops, PMI not laterally displaced GI- soft, non-tender, non-distended, bowel sounds present, no hepatosplenomegaly Extremities- no clubbing, cyanosis, or edema; DP/PT/radial pulses 2+ bilaterally MS- no significant deformity or atrophy Skin- warm and dry, no rash or lesion, left chest without hematoma/ecchymosis Psych- euthymic mood, full affect Neuro- strength and sensation are intact   Labs:   Lab Results  Component Value Date   WBC 6.9 11/01/2021   HGB 11.0 (L) 11/01/2021   HCT 32.0 (L) 11/01/2021   MCV 89.4 11/01/2021   PLT 128 (L) 11/01/2021    Recent Labs  Lab 10/29/21 0615 10/29/21 2323  11/01/21 0449  NA 137   < > 141  K 4.5   < > 3.9  CL 106   < > 103  CO2 22   < > 24  BUN 23   < > 19  CREATININE 1.09   < > 0.94  CALCIUM 8.9   < > 9.3  PROT 6.2*  --   --   BILITOT 0.8  --   --   ALKPHOS 51  --   --   ALT 16  --   --   AST 19  --   --   GLUCOSE 171*   < >  120*   < > = values in this interval not displayed.    Discharge Medications:  Allergies as of 11/02/2021       Reactions   Nitrostat [nitroglycerin] Anaphylaxis   "Cardiologist suggested he shouldn't take this med because it could kill him with his heart condition. Lowers BP"   Ace Inhibitors Swelling   Mevacor [lovastatin] Nausea Only   Solarcaine [benzocaine] Dermatitis        Medication List     TAKE these medications    acetaminophen 500 MG tablet Commonly known as: TYLENOL Take 1,000 mg by mouth every 6 (six) hours as needed for moderate pain.   amiodarone 200 MG tablet Commonly known as: PACERONE Take 1 tablet (200 mg total) by mouth 2 (two) times daily.   aspirin EC 81 MG tablet Take 1 tablet (81 mg total) by mouth daily.   carvedilol 3.125 MG tablet Commonly known as: COREG TAKE 1 TABLET (3.125 MG TOTAL) BY MOUTH 2 (TWO) TIMES DAILY WITH A MEAL.   colchicine 0.6 MG tablet Take 1 tablet (0.6 mg total) by mouth 2 (two) times daily.   digoxin 0.125 MG tablet Commonly known as: LANOXIN Take 1 tablet (0.125 mg total) by mouth daily. Start taking on: November 03, 2021   finasteride 5 MG tablet Commonly known as: PROSCAR Take 5 mg by mouth daily.   FISH OIL PO Take 1 capsule by mouth daily.   furosemide 40 MG tablet Commonly known as: LASIX TAKE 1 TABLET TWICE WEEKLY AS NEEDED AS DIRECTED What changed: See the new instructions.   gabapentin 300 MG capsule Commonly known as: NEURONTIN Take 1-2 capsules (300-600 mg total) by mouth 3 (three) times daily as needed (pain). What changed:  how much to take when to take this   hydrALAZINE 25 MG tablet Commonly known as: APRESOLINE Take 1 tablet (25 mg total) by mouth in the morning and at bedtime.   MAGNESIUM PO Take 1 tablet by mouth daily.   multivitamin with minerals Tabs tablet Take 1 tablet by mouth daily. One-A-Day Multivitamin   naproxen sodium 220 MG tablet Commonly known as: ALEVE Take 440 mg  by mouth daily as needed (pain).   QC TUMERIC COMPLEX PO Take 1 tablet by mouth daily.   rosuvastatin 10 MG tablet Commonly known as: CRESTOR Take 1 tablet (10 mg total) by mouth daily.   tamsulosin 0.4 MG Caps capsule Commonly known as: FLOMAX TAKE 1 CAPSULE EVERY DAY   VITAMIN B-12 PO Take 1 tablet by mouth daily.   ZINC PO Take 1 tablet by mouth daily.        Disposition:    Follow-up Information     [facility] Follow up.   Why: on 8/17 at 1040 for post hospital follow up Contact information: [place]  [place] [id number] [phone]                Duration of Discharge Encounter: Greater than 30 minutes including physician time.  Jacalyn Lefevre, PA-C  11/02/2021 9:09 AM  EP Attending  Patient seen and examined. Agree with the findings as noted above. The patient is doing well and is stable for DC home. He is chest pain free. No atrial fib. Normal DDD biv ICD function. He will be discharged home with followup as noted above. The patient will take the amiodarone for a few weeks as we hope to avoid atrial fib as he is not a good candidate for systemic anti-coagulation. He will take colchicine for about 6 weeks to reduce the risk of pericarditis.   Carleene Overlie Avanni Turnbaugh,MD

## 2021-11-02 NOTE — Discharge Instructions (Signed)
After Your ICD (Implantable Cardiac Defibrillator)   You have a St. Jude ICD  ACTIVITY Do not lift your arm above shoulder height for 1 week after your procedure. After 7 days, you may progress as below.  You should remove your sling 24 hours after your procedure, unless otherwise instructed by your provider.     Friday November 05, 2021  Saturday November 06, 2021 Sunday November 07, 2021 Monday November 08, 2021   Do not lift, push, pull, or carry anything over 10 pounds with the affected arm until 6 weeks (Tuesday December 14, 2021 ) after your procedure.   You may drive AFTER your wound check, unless you have been told otherwise by your provider.   Ask your healthcare provider when you can go back to work   INCISION/Dressing If you are on a blood thinner such as Coumadin, Xarelto, Eliquis, Plavix, or Pradaxa please confirm with your provider when this should be resumed.   If large square, outer bandage is left in place, this can be removed after 24 hours from your procedure. Do not remove steri-strips or glue as below.   Monitor your defibrillator site for redness, swelling, and drainage. Call the device clinic at [phone] if you experience these symptoms or fever/chills.  If your incision is sealed with Steri-strips or staples, you may shower 7 days after your procedure or when told by your provider. Do not remove the steri-strips or let the shower hit directly on your site. You may wash around your site with soap and water.    If you were discharged in a sling, please do not wear this during the day more than 48 hours after your surgery unless otherwise instructed. This may increase the risk of stiffness and soreness in your shoulder.   Avoid lotions, ointments, or perfumes over your incision until it is well-healed.  You may use a hot tub or a pool AFTER your wound check appointment if the incision is completely closed.  Your ICD is designed to protect you from life threatening  heart rhythms. Because of this, you may receive a shock.   1 shock with no symptoms:  Call the office during business hours. 1 shock with symptoms (chest pain, chest pressure, dizziness, lightheadedness, shortness of breath, overall feeling unwell):  Call 911. If you experience 2 or more shocks in 24 hours:  Call 911. If you receive a shock, you should not drive for 6 months per the [place] DMV IF you receive appropriate therapy from your ICD.   ICD Alerts:  Some alerts are vibratory and others beep. These are NOT emergencies. Please call our office to let us know. If this occurs at night or on weekends, it can wait until the next business day. Send a remote transmission.  If your device is capable of reading fluid status (for heart failure), you will be offered monthly monitoring to review this with you.   DEVICE MANAGEMENT Remote monitoring is used to monitor your ICD from home. This monitoring is scheduled every 91 days by our office. It allows [place] to keep an eye on the functioning of your device to ensure it is working properly. You will routinely see your Electrophysiologist annually (more often if necessary).   You should receive your ID card for your new device in 4-8 weeks. Keep this card with you at all times once received. Consider wearing a medical alert bracelet or necklace.  Your ICD  may be MRI compatible. This will be discussed at your  next office visit/wound check.  You should avoid contact with strong electric or magnetic fields.   Do not use amateur (ham) radio equipment or electric (arc) welding torches. MP3 player headphones with magnets should not be used. Some devices are safe to use if held at least 12 inches (30 cm) from your defibrillator. These include power tools, lawn mowers, and speakers. If you are unsure if something is safe to use, ask your health care provider.  When using your cell phone, hold it to the ear that is on the opposite side from the defibrillator. Do not  leave your cell phone in a pocket over the defibrillator.  You may safely use electric blankets, heating pads, computers, and microwave ovens.  Call the office right away if: You have chest pain. You feel more than one shock. You feel more short of breath than you have felt before. You feel more light-headed than you have felt before. Your incision starts to open up.  This information is not intended to replace advice given to you by your health care provider. Make sure you discuss any questions you have with your health care provider.

## 2021-11-02 NOTE — Care Management Important Message (Signed)
Important Message  Patient Details  Name: [name] [name] MRN: [id number] Date of Birth: October 22, 1945   Medicare Important Message Given:  Yes  Patient left prior to IM delivery will mail IM to the patient home address.    Kalisha Keadle 11/02/2021, 2:24 PM

## 2021-11-03 ENCOUNTER — Telehealth: Payer: Self-pay | Admitting: Internal Medicine

## 2021-11-03 NOTE — Telephone Encounter (Signed)
New Message:      Patient is supposed to travel to [place] on 12-09-21. Patient wants to know will it be alright for him to travel that far? It is about a 4 1/2 hour drive.

## 2021-11-03 NOTE — Telephone Encounter (Signed)
Attempted to return patients call. No answer, LMTCB.  Patient has wound check with A. Tillery, PA-C on 11/11/21.

## 2021-11-04 LAB — CULTURE, BLOOD (ROUTINE X 2)
Culture: NO GROWTH
Culture: NO GROWTH
Special Requests: ADEQUATE

## 2021-11-04 NOTE — Telephone Encounter (Signed)
Patient returned phone call. Advised he may travel to [place] during that time. Patient has DC apt upcoming and aware.

## 2021-11-08 NOTE — Progress Notes (Unsigned)
Electrophysiology Office Note Date: 11/11/2021  ID:  [name] [name], [name] [name] 11/21/1945, MRN [id number]  PCP: [name] Agreste, MD Primary Cardiologist: [name] Hew, MD Electrophysiologist: [name] Peru, MD   CC: Routine ICD follow-up  [name] [name] is a 76 y.o. male seen today for [name] Peru, MD for post hospital follow up.    Pt underwent Abbott BIV ICD implantation 8/2.   He returned 8/3 with CP and was admitted and underwent revision of RV lead with microperforation. During the procedure, pt had worsening vitals and developed tamponade requiring urgent placement of cardiac drain and central line placement. 200 mL of bloody fluid was drained. Started on colchicine. Drain removed without complication 10/03/2954. Amiodarone started for a WCT felt to be ST/AT with native LBBB. On evening of 8/6 pt had rapid AF and was bolused on amiodarone. Pt rhythm remained quiescent and echo that evening also showed no-re accumulation of effusion. Left chest was without hematoma or ecchymosis.  The patient was examined and considered stable for discharge to home. With close follow up as above.   Since discharge from hospital the patient reports doing very well.  he denies chest pain, palpitations, dyspnea, PND, orthopnea, nausea, vomiting, dizziness, syncope, edema, weight gain, or early satiety. He has not had ICD shocks.   Device History: St. Jude BiV ICD implanted 8/2 ; Lead revision 8/3 of perforation and pericardial effusion.    Past Medical History:  Diagnosis Date   Arthritis    BPH (benign prostatic hypertrophy)    CHF (congestive heart failure) ([place])    Elevated PSA    Eye problem 03/06/2016   Dr. Margaretmary [name] retinal detachment left eye, no sx done   History of gout    pt 05-20-2013 states stable    History of melanoma excision    2011-- LEFT UPPER BACK   Hyperlipemia    Hypertriglyceridemia    Left bundle branch block 08/2017   Concern for LBBB mediated cardiomyopathy    Melanoma ([place]) 2011   back   Nonischemic cardiomyopathy ([place]) 09/2020   08/2009: EF 40-45%, Global HK-> 09/2017 -> EF 45-50% ; 3/'22: EF<20%.  Elevated LVEDP.;  09/2020: EF 20-25%.  GR 1 DD   Numbness    hands   Personal history of arthritis    Personal history of other diseases of circulatory system    Pre-diabetes    Past Surgical History:  Procedure Laterality Date   ANTERIOR CERVICAL DECOMP/DISCECTOMY FUSION N/A 04/20/2018   Procedure: Anterior Cervical Decompression Fusion - Cervical Three-Cervical Four - Cervical Four-Cervical Five - Cervical Five-Cervical Six;  Surgeon: [name] Moore, MD;  Location: [facility];  Service: Neurosurgery;  Laterality: N/A;  Anterior Cervical Decompression Fusion - Cervical Three-Cervical Four - Cervical Four-Cervical Five - Cervical Five-Cervical Six   APPENDECTOMY  AS CHILD   BACK SURGERY     BIV ICD INSERTION CRT-D N/A 10/27/2021   Procedure: BIV ICD INSERTION CRT-D;  Surgeon: [name] Lance, MD;  Location: [facility];  Service: Cardiovascular;  Laterality: N/A;   CARDIOVASCULAR STRESS TEST  10/05/1998   MILD GLOBAL HYPOKINESIS AND ISCHEMIAIN ANTEROSEPTAL  AT [facility]X/ EF 42%   CARPAL TUNNEL RELEASE Right    CENTRAL LINE INSERTION  10/28/2021   Procedure: CENTRAL LINE INSERTION;  Surgeon: [name] Haw, MD;  Location: [facility];  Service: Cardiovascular;;   CENTRAL LINE INSERTION  10/28/2021   Procedure: CENTRAL LINE INSERTION;  Surgeon: [name] Mocha, MD;  Location: [facility];  Service: Cardiovascular;;   EXCISION MELANOMA LEFT UPPER BACK  10/14/2009   eye lid surgery Bilateral 2018   GOLD SEED IMPLANT N/A 07/23/2021   Procedure: GOLD SEED IMPLANT;  Surgeon: [name] Hughs, MD;  Location: [facility];  Service: Urology;  Laterality: N/A;   KNEE ARTHROSCOPY WITH SUBCHONDROPLASTY Left 04/07/2017   Procedure: LEFT KNEE ARTHROSCOPY WITH PARTIAL MEDIAL MENISCECTOMY AND MEDIAL TIBIAL SUBCHONDROPLASTY;  Surgeon: [name] Rossetti, MD;  Location: [facility];  Service: Orthopedics;  Laterality: Left;   knee injections Bilateral    LEAD REVISION/REPAIR N/A 10/28/2021   Procedure: LEAD REVISION/REPAIR;  Surgeon: [name] Haw, MD;  Location: [facility];  Service: Cardiovascular;  Laterality: N/A;   LEFT HEART CATH AND CORONARY ANGIOGRAPHY  04/2002   minimal irregularities in the LAD/  EF 50%  -   DR AL LITTLE   LUMBAR [place] SURGERY  09/12/2000   left  L5 -- S1   PERICARDIOCENTESIS N/A 10/28/2021   Procedure: PERICARDIOCENTESIS;  Surgeon: [name] Haw, MD;  Location: [facility];  Service: Cardiovascular;  Laterality: N/A;   PERICARDIOCENTESIS N/A 10/28/2021   Procedure: PERICARDIOCENTESIS;  Surgeon: [name] Mocha, MD;  Location: [facility];  Service: Cardiovascular;  Laterality: N/A;   PROSTATE BIOPSY N/A 05/22/2013   Procedure: BIOPSY TRANSRECTAL ULTRASONIC PROSTATE (TUBP);  Surgeon: [name] Amass, MD;  Location: [facility];  Service: Urology;  Laterality: N/A;   RIGHT/LEFT HEART CATH AND CORONARY ANGIOGRAPHY N/A 06/25/2020   Procedure: RIGHT/LEFT HEART CATH AND CORONARY ANGIOGRAPHY;  Surgeon: [name] Crome, MD;  Location: [facility];:; Normal coronary arteries; LVEDP 23 mmHg; PCWP 19 mmHg.   ROTATOR CUFF REPAIR Right 2016   SPACE OAR INSTILLATION N/A 07/23/2021   Procedure: SPACE OAR INSTILLATION;  Surgeon: [name] Hughs, MD;  [facility];  Service: Urology;  Laterality: N/A;   THROAT SURGERY  04/20/2018   TRANSRECTAL ULTRASOUND PROSTATE BX  05-23-2005  &  04-19-2001   TRANSTHORACIC ECHOCARDIOGRAM  09/2017   EF 45-50 % (previously reported as 40 to 45%).  Incoordinate septal motion with mild LVH.  GR 1 DD.  Aortic sclerosis but no stenosis.   TRANSTHORACIC ECHOCARDIOGRAM  10/06/2020   Severely reduced EF of 20 to 25%.  No LV thrombus.  Severe septal-lateral wall systolic dyssynchrony due to LBBB.  Global HK.  Moderately dilated LV.  GR 1 DD with mild LA  dilation..  Unable to assess PAP with normal RV size and function.  Normal RAP.  Mild AOV sclerosis but no stenosis.  Mild to moderate MR.   TRANSTHORACIC ECHOCARDIOGRAM  06/16/2020   Severely reduced EF <20%.  Moderate severely dilated LV.  GR 2 DD.  Elevated LVEDP.  Mild LA dilation.  Mildly reduced RV function.  Mild MR.  Mild aortic valve calcification    Current Outpatient Medications  Medication Sig Dispense Refill   acetaminophen (TYLENOL) 500 MG tablet Take 1,000 mg by mouth every 6 (six) hours as needed for moderate pain.     amiodarone (PACERONE) 200 MG tablet Take 1 tablet (200 mg total) by mouth 2 (two) times daily. 60 tablet 3   aspirin EC 81 MG tablet Take 1 tablet (81 mg total) by mouth daily.     carvedilol (COREG) 3.125 MG tablet TAKE 1 TABLET (3.125 MG TOTAL) BY MOUTH 2 (TWO) TIMES DAILY WITH A MEAL. 180 tablet 1   colchicine 0.6 MG tablet Take 1 tablet (0.6 mg total) by mouth 2 (  two) times daily. 90 tablet 0   Cyanocobalamin (VITAMIN B-12 PO) Take 1 tablet by mouth daily.     digoxin (LANOXIN) 0.125 MG tablet Take 1 tablet (0.125 mg total) by mouth daily. 30 tablet 6   finasteride (PROSCAR) 5 MG tablet Take 5 mg by mouth daily.     furosemide (LASIX) 40 MG tablet TAKE 1 TABLET TWICE WEEKLY AS NEEDED AS DIRECTED (Patient taking differently: Take 40 mg by mouth 2 (two) times a week. Tuesday, Thursday) 24 tablet 0   gabapentin (NEURONTIN) 300 MG capsule Take 1-2 capsules (300-600 mg total) by mouth 3 (three) times daily as needed (pain). (Patient taking differently: Take 600 mg by mouth 2 (two) times daily.) 540 capsule 1   hydrALAZINE (APRESOLINE) 25 MG tablet Take 1 tablet (25 mg total) by mouth in the morning and at bedtime. 180 tablet 3   MAGNESIUM PO Take 1 tablet by mouth daily.     Multiple Vitamin (MULTIVITAMIN WITH MINERALS) TABS tablet Take 1 tablet by mouth daily. One-A-Day Multivitamin     Multiple Vitamins-Minerals (ZINC PO) Take 1 tablet by mouth daily.     naproxen  sodium (ALEVE) 220 MG tablet Take 440 mg by mouth daily as needed (pain).     Omega-3 Fatty Acids (FISH OIL PO) Take 1 capsule by mouth daily.     rosuvastatin (CRESTOR) 10 MG tablet Take 1 tablet (10 mg total) by mouth daily. 90 tablet 3   tamsulosin (FLOMAX) 0.4 MG CAPS capsule TAKE 1 CAPSULE EVERY DAY (Patient taking differently: Take 0.4 mg by mouth daily.) 90 capsule 3   Turmeric (QC TUMERIC COMPLEX PO) Take 1 tablet by mouth daily.     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  Tobacco Use   Smoking status: Former    Packs/day: 2.00    Years: 20.00    Total pack years: 40.00    Types: Cigarettes    Quit date: 03/28/1980    Years since quitting: 41.6   Smokeless tobacco: Never  Vaping Use   Vaping Use: Never used  Substance and Sexual Activity   Alcohol use: No    Alcohol/week: 0.0 standard drinks of alcohol   Drug use: No   Sexual activity: Not on file  Other Topics Concern   Not on file  Social History Narrative   He walks on a relatively regular basis about 15 minutes at a time mostly to the discomfort. He previously had walked up a 30-40 minutes a time without problems. Education: [employer].   Lives alone.   Right-handed.   One cup coffee daily.   Social Determinants of Health   Financial Resource Strain: Not on file  Food Insecurity: Not on file  Transportation Needs: Not on file   Physical Activity: Not on file  Stress: Not on file  Social Connections: Not on file  Intimate Partner Violence: Not  on file    Family History: Family History  Problem Relation Age of Onset   Arthritis Mother    COPD Father    Lung cancer Father    Cancer Sister        type unknown   Breast cancer Sister    Lung cancer Sister    Cardiomyopathy Other        idiopathic. trivial disease 2004 cath, 2010 cardiac CT - no sig. dz, nl LV fxn. cards: Little   Colon cancer Neg Hx     Review of Systems: All other systems reviewed and are otherwise negative except as noted above.   Physical Exam: Vitals:   11/11/21 1003  BP: (!) 102/54  Pulse: 86  SpO2: 96%  Weight: 178 lb (80.7 kg)  Height: 5' 7.5" (1.715 m)     GEN- The patient is well appearing, alert and oriented x 3 today.   HEENT: normocephalic, atraumatic; sclera clear, conjunctiva pink; hearing intact; oropharynx clear; neck supple, no JVP Lymph- no cervical lymphadenopathy Lungs- Clear to ausculation bilaterally, normal work of breathing.  No wheezes, rales, rhonchi Heart- Regular rate and rhythm, no murmurs, rubs or gallops, PMI not laterally displaced GI- soft, non-tender, non-distended, bowel sounds present, no hepatosplenomegaly Extremities- no clubbing or cyanosis. No edema; DP/PT/radial pulses 2+ bilaterally MS- no significant deformity or atrophy Skin- warm and dry, no rash or lesion; ICD pocket well healed Psych- euthymic mood, full affect Neuro- strength and sensation are intact  ICD interrogation- reviewed in detail today,  See PACEART report  EKG:  EKG is not ordered today.  Recent Labs: 10/29/2021: ALT 16 10/31/2021: Magnesium 2.1 11/01/2021: BUN 19; Creatinine, Ser 0.94; Hemoglobin 11.0; Platelets 128; Potassium 3.9; Sodium 141   Wt Readings from Last 3 Encounters:  11/11/21 178 lb (80.7 kg)  10/27/21 179 lb (81.2 kg)  10/01/21 185 lb (83.9 kg)     Other studies Reviewed: Additional studies/  records that were reviewed today include: Previous EP office notes.   Assessment and Plan:  1.  Chronic systolic dysfunction s/p St. Jude CRT-D  2. S/p Lead revision euvolemic today Stable on an appropriate medical regimen Normal ICD function See Pace Art report No changes today  3. Pericardial effusion Resolved by echo 8/6 Continue colchicine to complete 6 weeks.   4. Paroxysmal atrial fibrillation Very low burden. Only 12 seconds total since discharge from hospital.  CHA2DS2/VASc is at least 3.  Given very low burden and recent pericardial effusion, will continue to defer [place].  If he has further AF will need to consider Ken Caryl.  Stop digoxin at pt request.  Continue amiodarone 200 mg BID x 7 days then go down to 200 mg daily.      Current medicines are reviewed at length with the patient today.    Labs/ tests ordered today include:  Orders Placed This Encounter  Procedures   Comprehensive metabolic panel   TSH   T4, free    Disposition:   Follow up with Dr. Lovena Le in 3 months as scheduled    Signed, Shirley Friar, PA-C  11/11/2021 10:35 AM  [facility] [address] [place] [address] [phone] (office) [phone] (fax)

## 2021-11-10 ENCOUNTER — Ambulatory Visit: Payer: Medicare HMO

## 2021-11-11 ENCOUNTER — Encounter: Payer: Self-pay | Admitting: Student

## 2021-11-11 ENCOUNTER — Ambulatory Visit: Payer: Medicare HMO | Admitting: Student

## 2021-11-11 VITALS — BP 102/54 | HR 86 | Ht 67.5 in | Wt 178.0 lb

## 2021-11-11 DIAGNOSIS — I447 Left bundle-branch block, unspecified: Secondary | ICD-10-CM

## 2021-11-11 DIAGNOSIS — I3139 Other pericardial effusion (noninflammatory): Secondary | ICD-10-CM | POA: Diagnosis not present

## 2021-11-11 DIAGNOSIS — I5022 Chronic systolic (congestive) heart failure: Secondary | ICD-10-CM | POA: Diagnosis not present

## 2021-11-11 LAB — CUP PACEART INCLINIC DEVICE CHECK
Battery Remaining Longevity: 54 mo
Brady Statistic RA Percent Paced: 0.04 %
Brady Statistic RV Percent Paced: 98 %
Date Time Interrogation Session: 20230817111748
HighPow Impedance: 64.125
Implantable Lead Implant Date: 20230802
Implantable Lead Implant Date: 20230802
Implantable Lead Implant Date: 20230802
Implantable Lead Location: 753859
Implantable Lead Location: 753860
Implantable Lead Location: 753860
Implantable Lead Model: 7122
Implantable Pulse Generator Implant Date: 20230802
Lead Channel Impedance Value: 262.5 Ohm
Lead Channel Impedance Value: 462.5 Ohm
Lead Channel Impedance Value: 600 Ohm
Lead Channel Pacing Threshold Amplitude: 0.75 V
Lead Channel Pacing Threshold Amplitude: 0.75 V
Lead Channel Pacing Threshold Amplitude: 0.75 V
Lead Channel Pacing Threshold Amplitude: 0.75 V
Lead Channel Pacing Threshold Amplitude: 0.75 V
Lead Channel Pacing Threshold Amplitude: 0.75 V
Lead Channel Pacing Threshold Pulse Width: 0.05 ms
Lead Channel Pacing Threshold Pulse Width: 0.05 ms
Lead Channel Pacing Threshold Pulse Width: 0.5 ms
Lead Channel Pacing Threshold Pulse Width: 0.5 ms
Lead Channel Pacing Threshold Pulse Width: 0.5 ms
Lead Channel Pacing Threshold Pulse Width: 0.5 ms
Lead Channel Sensing Intrinsic Amplitude: 11.3 mV
Lead Channel Sensing Intrinsic Amplitude: 3.1 mV
Lead Channel Setting Pacing Amplitude: 0.25 V
Lead Channel Setting Pacing Amplitude: 3.5 V
Lead Channel Setting Pacing Amplitude: 3.5 V
Lead Channel Setting Pacing Pulse Width: 0.05 ms
Lead Channel Setting Pacing Pulse Width: 0.5 ms
Lead Channel Setting Sensing Sensitivity: 0.5 mV
Pulse Gen Serial Number: 5544603

## 2021-11-11 LAB — COMPREHENSIVE METABOLIC PANEL
ALT: 21 IU/L (ref 0–44)
AST: 16 IU/L (ref 0–40)
Albumin/Globulin Ratio: 1.8 (ref 1.2–2.2)
Albumin: 4.4 g/dL (ref 3.8–4.8)
Alkaline Phosphatase: 93 IU/L (ref 44–121)
BUN/Creatinine Ratio: 13 (ref 10–24)
BUN: 17 mg/dL (ref 8–27)
Bilirubin Total: 0.3 mg/dL (ref 0.0–1.2)
CO2: 25 mmol/L (ref 20–29)
Calcium: 9.5 mg/dL (ref 8.6–10.2)
Chloride: 102 mmol/L (ref 96–106)
Creatinine, Ser: 1.29 mg/dL — ABNORMAL HIGH (ref 0.76–1.27)
Globulin, Total: 2.5 g/dL (ref 1.5–4.5)
Glucose: 95 mg/dL (ref 70–99)
Potassium: 4.8 mmol/L (ref 3.5–5.2)
Sodium: 139 mmol/L (ref 134–144)
Total Protein: 6.9 g/dL (ref 6.0–8.5)
eGFR: 57 mL/min/{1.73_m2} — ABNORMAL LOW (ref >59)

## 2021-11-11 LAB — T4, FREE: Free T4: 1.37 ng/dL (ref 0.82–1.77)

## 2021-11-11 LAB — TSH: TSH: 2.75 u[IU]/mL (ref 0.450–4.500)

## 2021-11-11 MED ORDER — AMIODARONE HCL 200 MG PO TABS: 200.0000 mg | ORAL_TABLET | Freq: Every day | ORAL | 3 refills | Status: DC

## 2021-11-11 NOTE — Patient Instructions (Addendum)
Medication Instructions:  Your physician has recommended you make the following change in your medication:   DISCONTINUE: Digoxin Take Amiodarone twice daily for 1 more week then take 1 daily  *If you need a refill on your cardiac medications before your next appointment, please call your pharmacy*   Lab Work: TODAY: CMET, TSH, FreeT4  If you have labs (blood work) drawn today and your tests are completely normal, you will receive your results only by: [place] (if you have MyChart) OR A paper copy in the mail If you have any lab test that is abnormal or we need to change your treatment, we will call you to review the results.  Follow-Up: At [facility], you and your health needs are our priority.  As part of our continuing mission to provide you with exceptional heart care, we have created designated Provider Care Teams.  These Care Teams include your primary Cardiologist (physician) and Advanced Practice Providers (APPs -  Physician Assistants and Nurse Practitioners) who all work together to provide you with the care you need, when you need it.   Your next appointment:   As scheduled

## 2021-11-12 ENCOUNTER — Telehealth: Payer: Self-pay | Admitting: Family Medicine

## 2021-11-12 NOTE — Telephone Encounter (Signed)
Left message for patient to call back and schedule Medicare Annual Wellness Visit (AWV).   Please offer to do virtually or by telephone.  Left office number and my jabber [phone].  Last AWV:08/08/2019  Please schedule at anytime with Nurse Health Advisor.

## 2021-11-15 NOTE — Progress Notes (Signed)
  Radiation Oncology         [phone]) [phone] ________________________________  Name: Quran Vasco MRN: [id number]  Date: 09/14/2021  DOB: [birth year]  End of Treatment Note  Diagnosis:   76 y.o. gentleman with Stage T1c adenocarcinoma of the prostate with Gleason score of 3+4, and PSA of 10.8.     Indication for treatment:  Curative, Definitive Radiotherapy       Radiation treatment dates:   08/05/21-09/14/21  Site/dose:   The prostate was treated to 70 Gy in 28 fractions of 2.5 Gy  Beams/energy:   The patient was treated with IMRT using volumetric arc therapy delivering 6 MV X-rays to clockwise and counterclockwise circumferential arcs with a 90 degree collimator offset to avoid dose scalloping.  Image guidance was performed with daily cone beam CT prior to each fraction to align to gold markers in the prostate and assure proper bladder and rectal fill volumes.  Immobilization was achieved with BodyFix custom mold.  Narrative: The patient tolerated radiation treatment relatively well.   The patient experienced some minor urinary irritation and modest fatigue.    Plan: The patient has completed radiation treatment. He will return to radiation oncology clinic for routine followup in one month. I advised him to call or return sooner if he has any questions or concerns related to his recovery or treatment. ________________________________  Sheral Apley. Tammi Klippel, M.D.

## 2021-11-17 ENCOUNTER — Other Ambulatory Visit: Payer: Self-pay | Admitting: General Practice

## 2021-11-23 ENCOUNTER — Ambulatory Visit (INDEPENDENT_AMBULATORY_CARE_PROVIDER_SITE_OTHER): Payer: Medicare HMO

## 2021-11-23 VITALS — Ht 68.0 in | Wt 178.0 lb

## 2021-11-23 DIAGNOSIS — Z0000 Encounter for general adult medical examination without abnormal findings: Secondary | ICD-10-CM

## 2021-11-23 NOTE — Progress Notes (Signed)
Subjective:   [name] [name] is a 76 y.o. male who presents for Medicare Annual/Subsequent preventive examination.   Virtual Visit via Telephone Note  I connected with  [name] [name] on 11/23/21 at  2:45 PM EDT by telephone and verified that I am speaking with the correct person using two identifiers.  Location: Patient: Home  Provider: [place]  Persons participating in the virtual visit: patient/Nurse Health Advisor   I discussed the limitations, risks, security and privacy concerns of performing an evaluation and management service by telephone and the availability of in person appointments. The patient expressed understanding and agreed to proceed.  Interactive audio and video telecommunications were attempted between this nurse and patient, however failed, due to patient having technical difficulties OR patient did not have access to video capability.  We continued and completed visit with audio only.  Some vital signs may be absent or patient reported.   Daphane Shepherd, LPN  Review of Systems     Cardiac Risk Factors include: advanced age (>70mn, >>96women);male gender     Objective:    Today's Vitals   11/23/21 1509  Weight: 178 lb (80.7 kg)  Height: '5\' 8"'$  (1.727 m)   Body mass index is 27.06 kg/m.     11/23/2021    3:18 PM 10/28/2021    4:45 AM 10/27/2021    8:42 AM 07/12/2021   10:49 AM 05/05/2021   12:41 PM 06/25/2020    8:50 AM 06/09/2020   12:33 AM  Advanced Directives  Does Patient Have a Medical Advance Directive? Yes Yes Yes Yes Yes Yes No  Type of AParamedicof [place]lo[place]Living will  HYoung PlaceLiving will HCoatsLiving will HRutlandLiving will HEden PrairieLiving will   Does patient want to make changes to medical advance directive?      No - Patient declined   Copy of HN[place]innawaseein Chart? No - copy requested  No - copy  requested      Would patient like information on creating a medical advance directive?      No - Patient declined No - Patient declined    Current Medications (verified) Outpatient Encounter Medications as of 11/23/2021  Medication Sig   acetaminophen (TYLENOL) 500 MG tablet Take 1,000 mg by mouth every 6 (six) hours as needed for moderate pain.   amiodarone (PACERONE) 200 MG tablet Take 1 tablet (200 mg total) by mouth daily.   aspirin EC 81 MG tablet Take 1 tablet (81 mg total) by mouth daily.   carvedilol (COREG) 3.125 MG tablet TAKE 1 TABLET (3.125 MG TOTAL) BY MOUTH 2 (TWO) TIMES DAILY WITH A MEAL.   colchicine 0.6 MG tablet Take 1 tablet (0.6 mg total) by mouth 2 (two) times daily.   Cyanocobalamin (VITAMIN B-12 PO) Take 1 tablet by mouth daily.   finasteride (PROSCAR) 5 MG tablet Take 5 mg by mouth daily.   furosemide (LASIX) 40 MG tablet Take 1 tablet (40 mg total) by mouth 2 (two) times daily.   gabapentin (NEURONTIN) 300 MG capsule Take 1-2 capsules (300-600 mg total) by mouth 3 (three) times daily as needed (pain). (Patient taking differently: Take 600 mg by mouth 2 (two) times daily.)   hydrALAZINE (APRESOLINE) 25 MG tablet Take 1 tablet (25 mg total) by mouth in the morning and at bedtime.   MAGNESIUM PO Take 1 tablet by mouth daily.   Multiple Vitamin (MULTIVITAMIN  WITH MINERALS) TABS tablet Take 1 tablet by mouth daily. One-A-Day Multivitamin   Multiple Vitamins-Minerals (ZINC PO) Take 1 tablet by mouth daily.   naproxen sodium (ALEVE) 220 MG tablet Take 440 mg by mouth daily as needed (pain).   Omega-3 Fatty Acids (FISH OIL PO) Take 1 capsule by mouth daily.   rosuvastatin (CRESTOR) 10 MG tablet Take 1 tablet (10 mg total) by mouth daily.   tamsulosin (FLOMAX) 0.4 MG CAPS capsule TAKE 1 CAPSULE EVERY DAY (Patient taking differently: Take 0.4 mg by mouth daily.)   Turmeric (QC TUMERIC COMPLEX PO) Take 1 tablet by mouth daily.   Facility-Administered Encounter Medications as of  11/23/2021  Medication   sodium chloride flush (NS) 0.9 % injection 3 mL    Allergies (verified) Nitrostat [nitroglycerin], Ace inhibitors, Mevacor [lovastatin], and Solarcaine [benzocaine]   History: Past Medical History:  Diagnosis Date   Arthritis    BPH (benign prostatic hypertrophy)    CHF (congestive heart failure) ([place])    Elevated PSA    Eye problem 03/06/2016   [name] retinal detachment left eye, no sx done   History of gout    pt 05-20-2013 states stable    History of melanoma excision    2011-- LEFT UPPER BACK   Hyperlipemia    Hypertriglyceridemia    Left bundle branch block 08/2017   Concern for LBBB mediated cardiomyopathy   Melanoma ([place]) 2011   back   Nonischemic cardiomyopathy ([place]) 09/2020   08/2009: EF 40-45%, Global HK-> 09/2017 -> EF 45-50% ; 3/'22: EF<20%.  Elevated LVEDP.;  09/2020: EF 20-25%.  GR 1 DD   Numbness    hands   Personal history of arthritis    Personal history of other diseases of circulatory system    Pre-diabetes    Past Surgical History:  Procedure Laterality Date   ANTERIOR CERVICAL DECOMP/DISCECTOMY FUSION N/A 04/20/2018   Procedure: Anterior Cervical Decompression Fusion - Cervical Three-Cervical Four - Cervical Four-Cervical Five - Cervical Five-Cervical Six;  Surgeon: Eustace Moore, MD;  Location: [facility];  Service: Neurosurgery;  Laterality: N/A;  Anterior Cervical Decompression Fusion - Cervical Three-Cervical Four - Cervical Four-Cervical Five - Cervical Five-Cervical Six   APPENDECTOMY  AS CHILD   BACK SURGERY     BIV ICD INSERTION CRT-D N/A 10/27/2021   Procedure: BIV ICD INSERTION CRT-D;  Surgeon: Evans Lance, MD;  Location: [facility];  Service: Cardiovascular;  Laterality: N/A;   CARDIOVASCULAR STRESS TEST  10/05/1998   MILD GLOBAL HYPOKINESIS AND ISCHEMIAIN ANTEROSEPTAL  AT APEX/ EF 42%   CARPAL TUNNEL RELEASE Right    CENTRAL LINE INSERTION  10/28/2021   Procedure: CENTRAL LINE INSERTION;  Surgeon: Constance Haw, MD;  Location: [place] [facility];  Service: Cardiovascular;;   CENTRAL LINE INSERTION  10/28/2021   Procedure: CENTRAL LINE INSERTION;  Surgeon: Sherren Mocha, MD;  Location: [facility];  Service: Cardiovascular;;   EXCISION MELANOMA LEFT UPPER BACK  10/14/2009   eye lid surgery Bilateral 2018   GOLD SEED IMPLANT N/A 07/23/2021   Procedure: GOLD SEED IMPLANT;  Surgeon: Ardis Hughs, MD;  Location: [facility];  Service: Urology;  Laterality: N/A;   KNEE ARTHROSCOPY WITH SUBCHONDROPLASTY Left 04/07/2017   Procedure: LEFT KNEE ARTHROSCOPY WITH PARTIAL MEDIAL MENISCECTOMY AND MEDIAL TIBIAL SUBCHONDROPLASTY;  Surgeon: Mcarthur Rossetti, MD;  Location: [facility];  Service: Orthopedics;  Laterality: Left;   knee injections Bilateral    LEAD REVISION/REPAIR N/A 10/28/2021  Procedure: LEAD REVISION/REPAIR;  Surgeon: Constance Haw, MD;  Location: [place] [facility];  Service: Cardiovascular;  Laterality: N/A;   LEFT HEART CATH AND CORONARY ANGIOGRAPHY  04/2002   minimal irregularities in the LAD/  EF 50%  -   DR AL LITTLE   LUMBAR [place] SURGERY  09/12/2000   left  L5 -- S1   PERICARDIOCENTESIS N/A 10/28/2021   Procedure: PERICARDIOCENTESIS;  Surgeon: Constance Haw, MD;  Location: [place] [facility];  Service: Cardiovascular;  Laterality: N/A;   PERICARDIOCENTESIS N/A 10/28/2021   Procedure: PERICARDIOCENTESIS;  Surgeon: Sherren Mocha, MD;  Location: [place] [facility];  Service: Cardiovascular;  Laterality: N/A;   PROSTATE BIOPSY N/A 05/22/2013   Procedure: BIOPSY TRANSRECTAL ULTRASONIC PROSTATE (TUBP);  Surgeon: [name], MD;  Location[facility];  Service: Urology;  Laterality: N/A;   RIGHT/LEFT HEART CATH AND CORONARY ANGIOGRAPHY N/A 06/25/2020   Procedure: RIGHT/LEFT HEART CATH AND CORONARY ANGIOGRAPHY;  Surgeon: Belva Crome, MD;  Location: [place] [facility];:; Normal coronary arteries; LVEDP 23 mmHg; PCWP 19 mmHg.   ROTATOR CUFF  REPAIR Right 2016   SPACE OAR INSTILLATION N/A 07/23/2021   Procedure: SPACE OAR INSTILLATION;  Surgeon: Ardis Hughs, MD;  [facility]: [facility];  Service: Urology;  Laterality: N/A;   THROAT SURGERY  04/20/2018   TRANSRECTAL ULTRASOUND PROSTATE BX  05-23-2005  &  04-19-2001   TRANSTHORACIC ECHOCARDIOGRAM  09/2017   EF 45-50 % (previously reported as 40 to 45%).  Incoordinate septal motion with mild LVH.  GR 1 DD.  Aortic sclerosis but no stenosis.   TRANSTHORACIC ECHOCARDIOGRAM  10/06/2020   Severely reduced EF of 20 to 25%.  No LV thrombus.  Severe septal-lateral wall systolic dyssynchrony due to LBBB.  Global HK.  Moderately dilated LV.  GR 1 DD with mild LA dilation..  Unable to assess PAP with normal RV size and function.  Normal RAP.  Mild AOV sclerosis but no stenosis.  Mild to moderate MR.   TRANSTHORACIC ECHOCARDIOGRAM  06/16/2020   Severely reduced EF <20%.  Moderate severely dilated LV.  GR 2 DD.  Elevated LVEDP.  Mild LA dilation.  Mildly reduced RV function.  Mild MR.  Mild aortic valve calcification   Family History  Problem Relation Age of Onset   Arthritis Mother    COPD Father    Lung cancer Father    Cancer Sister        type unknown   Breast cancer Sister    Lung cancer Sister    Cardiomyopathy Other        idiopathic. trivial disease 2004 cath, 2010 cardiac CT - no sig. dz, nl LV fxn. 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  Tobacco Use   Smoking status: Former    Packs/day: 2.00    Years: 20.00    Total pack years: 40.00    Types: Cigarettes    Quit date: 03/28/1980    Years since quitting: 41.6   Smokeless tobacco: Never  Vaping Use   Vaping Use: Never used   Substance and Sexual Activity   Alcohol use: No  Alcohol/week: 0.0 standard drinks of alcohol   Drug use: No   Sexual activity: Not on file  Other Topics Concern   Not on file  Social History Narrative   He walks on a relatively regular basis about 15 minutes at a time mostly to the discomfort. He previously had walked up a 30-40 minutes a time without problems. Education: [employer].   Lives alone.   Right-handed.   One cup coffee daily.   Social Determinants of Health   Financial Resource Strain: Low Risk  (11/23/2021)   Overall Financial Resource Strain (CARDIA)    Difficulty of Paying Living Expenses: Not hard at all  Food Insecurity: No Food Insecurity (11/23/2021)   Hunger Vital Sign    Worried About Running Out of Food in the Last Year: Never true    Ran Out of Food in the Last Year: Never true  Transportation Needs: No Transportation Needs (11/23/2021)   PRAPARE - Hydrologist (Medical): No    Lack of Transportation (Non-Medical): No  Physical Activity: Insufficiently Active (11/23/2021)   Exercise Vital Sign    Days of Exercise per Week: 3 days    Minutes of Exercise per Session: 30 min  Stress: No Stress Concern Present (11/23/2021)   [place]    Feeling of Stress : Not at all  Social Connections: [place] (11/23/2021)   Social Connection and Isolation Panel [NHANES]    Frequency of Communication with Friends and Family: More than three times a week    Frequency of Social Gatherings with Friends and Family: More than three times a week    Attends Religious Services: More than 4 times per year    Active Member of [employer] or Organizations: Yes    Attends Music therapist: More than 4 times per year    Marital Status: Living with partner    Tobacco Counseling Counseling given: Not Answered   Clinical Intake:  Pre-visit preparation completed:  Yes  Pain : No/denies pain     Diabetes: No  How often do you need to have someone help you when you read instructions, pamphlets, or other written materials from your doctor or pharmacy?: 1 - Never What is the last grade level you completed in school?: 10th grade  Diabetic?no  Interpreter Needed?: No  Information entered by :: L.Wilson,LPN   Activities of Daily Living    11/23/2021    3:16 PM 07/12/2021   10:50 AM  In your present state of health, do you have any difficulty performing the following activities:  Hearing? 0   Vision? 0   Difficulty concentrating or making decisions? 0   Walking or climbing stairs? 0   Dressing or bathing? 0   Doing errands, shopping? 0 0  Preparing Food and eating ? N   Using the Toilet? N   In the past six months, have you accidently leaked urine? N   Do you have problems with loss of bowel control? N   Managing your Medications? N   Managing your Finances? N   Housekeeping or managing your Housekeeping? N     Patient Care Team: [name], MD as PCP - General (Family Medicine) Leonie Man, MD as PCP - Cardiology (Cardiology) Evans Lance, MD as PCP - Electrophysiology (Cardiology) Rana Snare, MD (Inactive) (Urology) Milus Banister, MD (Gastroenterology) Rex Kras Jeanella Craze, MD as Attending Physician (Cardiology)  Indicate any recent Medical Services  you may have received from other than [facility] providers in the past year (date may be approximate).     Assessment:   This is a routine wellness examination for [name] [name].  Hearing/Vision screen Vision Screening - Comments:: Annual eye exams wear glasses   Dietary issues and exercise activities discussed: Current Exercise Habits: Home exercise routine, Type of exercise: walking, Time (Minutes): 30, Frequency (Times/Week): 3, Weekly Exercise (Minutes/Week): 90, Intensity: Mild, Exercise limited by: cardiac condition(s)   Goals Addressed             This Visit's  Progress    Patient Stated   On track    Keep fishing and staying active       Depression Screen    11/23/2021    3:14 PM 09/01/2021   12:58 PM 02/26/2021   10:52 AM 08/27/2020   11:49 AM 05/14/2020    1:07 PM 02/27/2020   10:03 AM 01/08/2020   10:47 AM  PHQ 2/9 Scores  PHQ - 2 Score 0 0 0 3 0 0 0  PHQ- 9 Score  0 2 6       Fall Risk    11/23/2021    3:11 PM 09/01/2021   12:58 PM 02/26/2021   10:52 AM 12/29/2020   10:33 AM 08/27/2020   11:48 AM  [place] in the past year? 0 0 0 1 0  Number falls in past yr: 0 0 0 1   Injury with Fall? 0 0 0 0   Risk for fall due to : No Fall Risks No Fall Risks No Fall Risks No Fall Risks   Follow up Falls prevention discussed Falls evaluation completed Falls evaluation completed Falls evaluation completed Falls evaluation completed    FALL RISK PREVENTION PERTAINING TO THE HOME:  Any stairs in or around the home? Yes  If so, are there any without handrails? No  Home free of loose throw rugs in walkways, pet beds, electrical cords, etc? Yes  Adequate lighting in your home to reduce risk of falls? Yes   ASSISTIVE DEVICES UTILIZED TO PREVENT FALLS:  Life alert? No  Use of a cane, walker or w/c? No  Grab bars in the bathroom? No  Shower chair or bench in shower? No  Elevated toilet seat or a handicapped toilet? No          11/23/2021    3:18 PM 08/08/2019    9:10 AM 08/08/2019    9:08 AM 07/24/2018   11:14 AM  6CIT Screen  What Year? 0 points 0 points 0 points 0 points  What month? 0 points 0 points 0 points 0 points  What time? 0 points 0 points 0 points 0 points  Count back from 20 0 points 0 points 0 points 0 points  Months in reverse 0 points 4 points 0 points 0 points  Repeat phrase 2 points 2 points 0 points 0 points  Total Score 2 points 6 points 0 points 0 points    Immunizations Immunization History  Administered Date(s) Administered   Hepatitis B 01/14/2011   Influenza Split 01/14/2011, 01/18/2012   Influenza,  High Dose Seasonal PF 01/10/2018, 12/19/2018   Influenza,inj,Quad PF,6+ Mos 02/04/2013, 12/26/2013, 01/23/2015, 01/22/2016, 12/16/2016   Influenza-Unspecified 12/27/2019   PFIZER(Purple Top)SARS-COV-2 Vaccination 04/30/2019, 05/22/2019, 12/31/2019, 07/22/2020   Pneumococcal Conjugate-13 05/06/2014   Pneumococcal Polysaccharide-23 03/28/2004, 04/29/2009   Tdap 08/11/2009   Zoster, Live 06/11/2013    TDAP status: Due, Education has been provided regarding the importance  of this vaccine. Advised may receive this vaccine at local pharmacy or Health Dept. Aware to provide a copy of the vaccination record if obtained from local pharmacy or Health Dept. Verbalized acceptance and understanding.  Flu Vaccine status: Up to date  Pneumococcal vaccine status: Up to date  Covid-19 vaccine status: Completed vaccines  Qualifies for Shingles Vaccine? Yes   Zostavax completed No   Shingrix Completed?: No.    Education has been provided regarding the importance of this vaccine. Patient has been advised to call insurance company to determine out of pocket expense if they have not yet received this vaccine. Advised may also receive vaccine at local pharmacy or Health Dept. Verbalized acceptance and understanding.  Screening Tests Health Maintenance  Topic Date Due   COVID-19 Vaccine (5 - Pfizer risk series) 09/16/2020   Zoster Vaccines- Shingrix (2 of 2) 07/28/2021   INFLUENZA VACCINE  10/26/2021   COLONOSCOPY (Pts 45-2yr Insurance coverage will need to be confirmed)  09/02/2022 (Originally 05/04/2020)   TETANUS/TDAP  06/05/2031   Pneumonia Vaccine 76 Years old  Completed   Hepatitis C Screening  Completed   HPV VACCINES  Aged Out    Health Maintenance  Health Maintenance Due  Topic Date Due   COVID-19 Vaccine (5 - Pfizer risk series) 09/16/2020   Zoster Vaccines- Shingrix (2 of 2) 07/28/2021   INFLUENZA VACCINE  10/26/2021    Colorectal cancer screening: No longer required.   Lung Cancer  Screening: (Low Dose CT Chest recommended if Age 77-80years, 30 pack-year currently smoking OR have quit w/in 15years.) does not qualify.   Lung Cancer Screening Referral: n/a  Additional Screening:  Hepatitis C Screening: does not qualify;   Vision Screening: Recommended annual ophthalmology exams for early detection of glaucoma and other disorders of the eye. Is the patient up to date with their annual eye exam?  Yes  Who is the provider or what is the name of the office in which the patient attends annual eye exams? Dr.Groat  If pt is not established with a provider, would they like to be referred to a provider to establish care? No .   Dental Screening: Recommended annual dental exams for proper oral hygiene  Community Resource Referral / Chronic Care Management: CRR required this visit?  No   CCM required this visit?  No      Plan:     I have personally reviewed and noted the following in the patient's chart:   Medical and social history Use of alcohol, tobacco or illicit drugs  Current medications and supplements including opioid prescriptions. Patient is not currently taking opioid prescriptions. Functional ability and status Nutritional status Physical activity Advanced directives List of other physicians Hospitalizations, surgeries, and ER visits in previous 12 months Vitals Screenings to include cognitive, depression, and falls Referrals and appointments  In addition, I have reviewed and discussed with patient certain preventive protocols, quality metrics, and best practice recommendations. A written personalized care plan for preventive services as well as general preventive health recommendations were provided to patient.     LDaphane Shepherd LPN   [id number]  Nurse Notes: Aim for 30 minutes of exercise or brisk walking, 6-8 glasses of water, and 5 servings of fruits and vegetables each day.

## 2021-11-23 NOTE — Patient Instructions (Signed)
[name] [name] , Thank you for taking time to come for your Medicare Wellness Visit. I appreciate your ongoing commitment to your health goals. Please review the following plan we discussed and let me know if I can assist you in the future.   Screening recommendations/referrals: Colonoscopy: no longer required  Recommended yearly ophthalmology/optometry visit for glaucoma screening and checkup Recommended yearly dental visit for hygiene and checkup  Vaccinations: Influenza vaccine: completed  Pneumococcal vaccine: completed  Tdap vaccine: Due  Shingles vaccine: Completed    Covid-19: completed   Advanced directives: yes   Conditions/risks identified: Aim for 30 minutes of exercise or brisk walking, 6-8 glasses of water, and 5 servings of fruits and vegetables each day.   Next appointment: Follow up in one year for your annual wellness visit.   Preventive Care 13 Years and Older, Male  Preventive care refers to lifestyle choices and visits with your health care provider that can promote health and wellness. What does preventive care include? A yearly physical exam. This is also called an annual well check. Dental exams once or twice a year. Routine eye exams. Ask your health care provider how often you should have your eyes checked. Personal lifestyle choices, including: Daily care of your teeth and gums. Regular physical activity. Eating a healthy diet. Avoiding tobacco and drug use. Limiting alcohol use. Practicing safe sex. Taking low doses of aspirin every day. Taking vitamin and mineral supplements as recommended by your health care provider. What happens during an annual well check? The services and screenings done by your health care provider during your annual well check will depend on your age, overall health, lifestyle risk factors, and family history of disease. Counseling  Your health care provider may ask you questions about your: Alcohol use. Tobacco use. Drug  use. Emotional well-being. Home and relationship well-being. Sexual activity. Eating habits. History of falls. Memory and ability to understand (cognition). Work and work Statistician. Screening  You may have the following tests or measurements: Height, weight, and BMI. Blood pressure. Lipid and cholesterol levels. These may be checked every 5 years, or more frequently if you are over 74 years old. Skin check. Lung cancer screening. You may have this screening every year starting at age 41 if you have a 30-pack-year history of smoking and currently smoke or have quit within the past 15 years. Fecal occult blood test (FOBT) of the stool. You may have this test every year starting at age 40. Flexible sigmoidoscopy or colonoscopy. You may have a sigmoidoscopy every 5 years or a colonoscopy every 10 years starting at age 64. Prostate cancer screening. Recommendations will vary depending on your family history and other risks. Hepatitis C blood test. Hepatitis B blood test. Sexually transmitted disease (STD) testing. Diabetes screening. This is done by checking your blood sugar (glucose) after you have not eaten for a while (fasting). You may have this done every 1-3 years. Abdominal aortic aneurysm (AAA) screening. You may need this if you are a current or former smoker. Osteoporosis. You may be screened starting at age 30 if you are at high risk. Talk with your health care provider about your test results, treatment options, and if necessary, the need for more tests. Vaccines  Your health care provider may recommend certain vaccines, such as: Influenza vaccine. This is recommended every year. Tetanus, diphtheria, and acellular pertussis (Tdap, Td) vaccine. You may need a Td booster every 10 years. Zoster vaccine. You may need this after age 56. Pneumococcal 13-valent conjugate (  PCV13) vaccine. One dose is recommended after age 5. Pneumococcal polysaccharide (PPSV23) vaccine. One dose is  recommended after age 18. Talk to your health care provider about which screenings and vaccines you need and how often you need them. This information is not intended to replace advice given to you by your health care provider. Make sure you discuss any questions you have with your health care provider. Document Released: 04/10/2015 Document Revised: 12/02/2015 Document Reviewed: 01/13/2015 Elsevier Interactive Patient Education  2017 [place] Prevention in the Home Falls can cause injuries. They can happen to people of all ages. There are many things you can do to make your home safe and to help prevent falls. What can I do on the outside of my home? Regularly fix the edges of walkways and driveways and fix any cracks. Remove anything that might make you trip as you walk through a door, such as a raised step or threshold. Trim any bushes or trees on the path to your home. Use bright outdoor lighting. Clear any walking paths of anything that might make someone trip, such as rocks or tools. Regularly check to see if handrails are loose or broken. Make sure that both sides of any steps have handrails. Any raised decks and porches should have guardrails on the edges. Have any leaves, snow, or ice cleared regularly. Use sand or salt on walking paths during winter. Clean up any spills in your garage right away. This includes oil or grease spills. What can I do in the bathroom? Use night lights. Install grab bars by the toilet and in the tub and shower. Do not use towel bars as grab bars. Use non-skid mats or decals in the tub or shower. If you need to sit down in the shower, use a plastic, non-slip stool. Keep the floor dry. Clean up any water that spills on the floor as soon as it happens. Remove soap buildup in the tub or shower regularly. Attach bath mats securely with double-sided non-slip rug tape. Do not have throw rugs and other things on the floor that can make you  trip. What can I do in the bedroom? Use night lights. Make sure that you have a light by your bed that is easy to reach. Do not use any sheets or blankets that are too big for your bed. They should not hang down onto the floor. Have a firm chair that has side arms. You can use this for support while you get dressed. Do not have throw rugs and other things on the floor that can make you trip. What can I do in the kitchen? Clean up any spills right away. Avoid walking on wet floors. Keep items that you use a lot in easy-to-reach places. If you need to reach something above you, use a strong step stool that has a grab bar. Keep electrical cords out of the way. Do not use floor polish or wax that makes floors slippery. If you must use wax, use non-skid floor wax. Do not have throw rugs and other things on the floor that can make you trip. What can I do with my stairs? Do not leave any items on the stairs. Make sure that there are handrails on both sides of the stairs and use them. Fix handrails that are broken or loose. Make sure that handrails are as long as the stairways. Check any carpeting to make sure that it is firmly attached to the stairs. Fix any carpet that is  loose or worn. Avoid having throw rugs at the top or bottom of the stairs. If you do have throw rugs, attach them to the floor with carpet tape. Make sure that you have a light switch at the top of the stairs and the bottom of the stairs. If you do not have them, ask someone to add them for you. What else can I do to help prevent falls? Wear shoes that: Do not have high heels. Have rubber bottoms. Are comfortable and fit you well. Are closed at the toe. Do not wear sandals. If you use a stepladder: Make sure that it is fully opened. Do not climb a closed stepladder. Make sure that both sides of the stepladder are locked into place. Ask someone to hold it for you, if possible. Clearly mark and make sure that you can  see: Any grab bars or handrails. First and last steps. Where the edge of each step is. Use tools that help you move around (mobility aids) if they are needed. These include: Canes. Walkers. Scooters. Crutches. Turn on the lights when you go into a dark area. Replace any light bulbs as soon as they burn out. Set up your furniture so you have a clear path. Avoid moving your furniture around. If any of your floors are uneven, fix them. If there are any pets around you, be aware of where they are. Review your medicines with your doctor. Some medicines can make you feel dizzy. This can increase your chance of falling. Ask your doctor what other things that you can do to help prevent falls. This information is not intended to replace advice given to you by your health care provider. Make sure you discuss any questions you have with your health care provider. Document Released: 01/08/2009 Document Revised: 08/20/2015 Document Reviewed: 04/18/2014 Elsevier Interactive Patient Education  2017 [employer].

## 2021-11-25 DIAGNOSIS — D225 Melanocytic nevi of trunk: Secondary | ICD-10-CM | POA: Diagnosis not present

## 2021-11-25 DIAGNOSIS — L821 Other seborrheic keratosis: Secondary | ICD-10-CM | POA: Diagnosis not present

## 2021-11-25 DIAGNOSIS — L814 Other melanin hyperpigmentation: Secondary | ICD-10-CM | POA: Diagnosis not present

## 2021-11-25 DIAGNOSIS — Z08 Encounter for follow-up examination after completed treatment for malignant neoplasm: Secondary | ICD-10-CM | POA: Diagnosis not present

## 2021-11-25 DIAGNOSIS — Z85820 Personal history of malignant melanoma of skin: Secondary | ICD-10-CM | POA: Diagnosis not present

## 2021-12-01 ENCOUNTER — Other Ambulatory Visit: Payer: Self-pay | Admitting: Cardiology

## 2021-12-01 ENCOUNTER — Other Ambulatory Visit: Payer: Self-pay | Admitting: General Practice

## 2021-12-03 ENCOUNTER — Encounter: Payer: Self-pay | Admitting: Gastroenterology

## 2021-12-07 ENCOUNTER — Encounter: Payer: Self-pay | Admitting: Urology

## 2021-12-07 NOTE — Progress Notes (Signed)
Radiation Oncology         [phone] ________________________________  Name: Emmette Katt MRN: [id number]  Date: 12/08/2021  DOB: [birth year]  Post Treatment Note  CC: [name], MD  [facility]*  Diagnosis:    76 y.o. gentleman with Stage T1c adenocarcinoma of the prostate with Gleason score of 3+4, and PSA of 10.8.     Interval Since Last Radiation:  3 months  08/05/21-09/14/21:   The prostate was treated to 70 Gy in 28 fractions of 2.5 Gy  Narrative:  I spoke with the patient to conduct his routine scheduled follow up visit via telephone to spare the patient unnecessary potential exposure in the healthcare setting during the current COVID-19 pandemic.  The patient was notified in advance and gave permission to proceed with this visit format.  He tolerated radiation treatment relatively well with only minor urinary irritation and modest fatigue.  He experienced increased nocturia which did improve with Flomax and finasteride daily.  He also reported some constipation but denied abdominal pain, nausea, vomiting or diarrhea.                               On review of systems, the patient states that he is doing very well in general.  He reports that his LUTS are gradually improving though he does continue with some mild increased nocturia at this point.  He specifically denies dysuria, gross hematuria, straining to void, incomplete bladder emptying or incontinence.  He is continue taking finasteride and Flomax daily as prescribed.  He denies any abdominal pain, nausea, vomiting, diarrhea or constipation.  He reports a healthy appetite and is maintaining his weight.  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  Overall, he is pleased with his progress and feels like he is gradually regaining his energy and strength.  [place] his friend, Lynelle Smoke,  for some relaxation.  ALLERGIES:  is allergic to nitrostat [nitroglycerin], ace inhibitors, mevacor [lovastatin], and solarcaine [benzocaine].  Meds: Current Outpatient Medications  Medication Sig Dispense Refill   acetaminophen (TYLENOL) 500 MG tablet Take 1,000 mg by mouth every 6 (six) hours as needed for moderate pain.     amiodarone (PACERONE) 200 MG tablet Take 1 tablet (200 mg total) by mouth daily. 90 tablet 3   aspirin EC 81 MG tablet Take 1 tablet (81 mg total) by mouth daily.     carvedilol (COREG) 3.125 MG tablet TAKE 1 TABLET (3.125 MG TOTAL) 2 TIMES DAILY WITH A MEAL. 180 tablet 3   colchicine 0.6 MG tablet Take 1 tablet (0.6 mg total) by mouth 2 (two) times daily. 90 tablet 0   Cyanocobalamin (VITAMIN B-12 PO) Take 1 tablet by mouth daily.     finasteride (PROSCAR) 5 MG tablet Take 5 mg by mouth daily.     furosemide (LASIX) 40 MG tablet Take 1 tablet (40 mg total) by mouth 2 (two) times daily. 24 tablet 3   gabapentin (NEURONTIN) 300 MG capsule Take 1-2 capsules (300-600 mg total) by mouth 3 (three) times daily as needed (pain). (Patient taking differently: Take 600 mg by mouth 2 (two) times daily.) 540 capsule 1   hydrALAZINE (APRESOLINE) 25 MG tablet TAKE 1 TABLET (25 MG TOTAL) BY MOUTH IN THE MORNING AND AT BEDTIME. 60 tablet 11   MAGNESIUM PO Take  1 tablet by mouth daily.     Multiple Vitamin (MULTIVITAMIN WITH MINERALS) TABS tablet Take 1 tablet by mouth daily. One-A-Day Multivitamin     Multiple Vitamins-Minerals (ZINC PO) Take 1 tablet by mouth daily.     naproxen sodium (ALEVE) 220 MG tablet Take 440 mg by mouth daily as needed (pain).     Omega-3 Fatty Acids (FISH OIL PO) Take 1 capsule by mouth daily.     rosuvastatin (CRESTOR) 10 MG tablet Take 1 tablet (10 mg total) by mouth daily. 90 tablet 3   tamsulosin (FLOMAX) 0.4 MG CAPS capsule TAKE 1 CAPSULE EVERY DAY (Patient taking differently: Take 0.4 mg by mouth daily.) 90 capsule 3   Turmeric (QC TUMERIC COMPLEX PO)  Take 1 tablet by mouth daily.     Current Facility-Administered Medications  Medication Dose Route Frequency Provider Last Rate Last Admin   sodium chloride flush (NS) 0.9 % injection 3 mL  3 mL Intravenous Q12H Deberah Pelton, NP        Physical Findings:  vitals were not taken for this visit.  Pain Assessment Pain Score: 0-No pain/10 Unable to assess due to telephone follow-up visit format.  Lab Findings: Lab Results  Component Value Date   WBC 6.9 11/01/2021   HGB 11.0 (L) 11/01/2021   HCT 32.0 (L) 11/01/2021   MCV 89.4 11/01/2021   PLT 128 (L) 11/01/2021     Radiographic Findings: CUP PACEART INCLINIC DEVICE CHECK  Result Date: 11/11/2021 Wound check appointment. Steri-strips removed. Wound without redness or edema. Incision edges approximated, wound well healed. Normal device function. Thresholds, sensing, and impedances consistent with implant measurements. Device programmed at 3.5V for  extra safety margin until 3 month visit. Histogram distribution appropriate for patient and level of activity. 12 seconds of atrial fibrillation. Given recent pericardial effusion, will continue to defer [facility] for now. No ventricular arrhythmias noted. Patient educated about wound care, arm mobility, lifting restrictions, shock plan. ROV in 3 months with implanting physician.   Impression/Plan: 1.  76 y.o. gentleman with Stage T1c adenocarcinoma of the prostate with Gleason score of 3+4, and PSA of 10.8.    He will continue to follow up with urology for ongoing PSA determinations and has an appointment scheduled for labs on 12/27/2021 and will see Dr. Lovena Neighbours the following week.  He is continue taking Flomax and finasteride daily as prescribed.  He understands what to expect with regards to PSA monitoring going forward. I will look forward to following his response to treatment via correspondence with urology, and would be happy to continue to participate in his care if clinically indicated. I  talked to the patient about what to expect in the future, including his risk for erectile dysfunction and rectal bleeding. I encouraged him to call or return to the office if he has any questions regarding his previous radiation or possible radiation side effects. He was comfortable with this plan and will follow up as needed.     Nicholos Johns, PA-C

## 2021-12-07 NOTE — Progress Notes (Signed)
Telephone appointment. I spoke w/ patient's friend Ms. [name], verified her identity and began nursing interview. She reports patient experiencing some fatigue, dizziness, and mild sob upon standing. Home BP reported in normal range 122/74. No other issues reported at this time.  Meaningful use complete. I-PSS score of 3-mild. Flomax as directed. Urology appt-Dec, 2023  Reminded Ms. Megan Salon of patient's 9:00am-12/08/21 telephone appointment w/ Freeman Caldron PA-C. I left my extension [phone] in case patient needs anything. Ms. Megan Salon verbalized understanding.  Patient contact [phone] or [phone] (Ms. Megan Salon)

## 2021-12-08 ENCOUNTER — Ambulatory Visit
Admission: RE | Admit: 2021-12-08 | Discharge: 2021-12-08 | Disposition: A | Discharge status: Home / Self Care | Payer: Medicare HMO | Source: Ambulatory Visit | Attending: Urology | Admitting: Urology

## 2021-12-08 ENCOUNTER — Other Ambulatory Visit: Payer: Self-pay | Admitting: Urology

## 2021-12-08 DIAGNOSIS — C61 Malignant neoplasm of prostate: Secondary | ICD-10-CM

## 2021-12-24 DIAGNOSIS — M25561 Pain in right knee: Secondary | ICD-10-CM | POA: Diagnosis not present

## 2021-12-24 DIAGNOSIS — R972 Elevated prostate specific antigen [PSA]: Secondary | ICD-10-CM | POA: Diagnosis not present

## 2021-12-25 ENCOUNTER — Other Ambulatory Visit: Payer: Self-pay | Admitting: Family Medicine

## 2021-12-25 DIAGNOSIS — E781 Pure hyperglyceridemia: Secondary | ICD-10-CM

## 2021-12-25 DIAGNOSIS — I428 Other cardiomyopathies: Secondary | ICD-10-CM

## 2022-01-03 DIAGNOSIS — C61 Malignant neoplasm of prostate: Secondary | ICD-10-CM | POA: Diagnosis not present

## 2022-01-03 DIAGNOSIS — N401 Enlarged prostate with lower urinary tract symptoms: Secondary | ICD-10-CM | POA: Diagnosis not present

## 2022-01-03 DIAGNOSIS — R351 Nocturia: Secondary | ICD-10-CM | POA: Diagnosis not present

## 2022-01-08 ENCOUNTER — Other Ambulatory Visit: Payer: Self-pay | Admitting: Cardiology

## 2022-01-10 ENCOUNTER — Encounter: Payer: Self-pay | Admitting: Gastroenterology

## 2022-01-10 ENCOUNTER — Ambulatory Visit (INDEPENDENT_AMBULATORY_CARE_PROVIDER_SITE_OTHER): Payer: Medicare HMO | Admitting: Gastroenterology

## 2022-01-10 VITALS — BP 128/62 | HR 68 | Ht 67.0 in | Wt 183.6 lb

## 2022-01-10 DIAGNOSIS — Z86010 Personal history of colonic polyps: Secondary | ICD-10-CM

## 2022-01-10 NOTE — Patient Instructions (Signed)
Will call back to schedule after talking with Dr. Silverio Decamp.   _______________________________________________________  If you are age 77 or older, your body mass index should be between 23-30. Your Body mass index is 28.76 kg/m. If this is out of the aforementioned range listed, please consider follow up with your Primary Care Provider.  If you are age 27 or younger, your body mass index should be between 19-25. Your Body mass index is 28.76 kg/m. If this is out of the aformentioned range listed, please consider follow up with your Primary Care Provider.   ________________________________________________________  The [facility] providers would like to encourage you to use [facility] to communicate with providers for non-urgent requests or questions.  Due to long hold times on the telephone, sending your provider a message by [facility] may be a faster and more efficient way to get a response.  Please allow 48 business hours for a response.  Please remember that this is for non-urgent requests.  _______________________________________________________

## 2022-01-10 NOTE — Progress Notes (Signed)
01/10/2022 [name] [name] [id number] 1946/01/09  Review of pertinent gastrointestinal problems: 1. Tubular adenomas, small. Removed 04/2007 colonoscopy. Colonoscopy 2014 3 subcentimeter adenomas removed. Colonoscopy 04/2015 a single subcentimeter tubular adenoma removed. 2. Left sided tics, internal hems also noted on above 2009 colonoscopy.   HISTORY OF PRESENT ILLNESS: This is a 76 year old male is a patient of Dr. Ardis Hughs.  He has low EF of 25 to 30% had a defibrillator placed in August.  He is back to doing all his regular activities.  He says that he feels great.  Is very active.  Last colonoscopy as above in February 2017 showed single subcentimeter tubular adenoma that was removed.  He would like to have another colonoscopy, was hoping to have it done by the end of the year for insurance purposes.  He denies any GI complaints.  Does take 1 stool softener capsule daily, which helps him move his bowels.   Past Medical History:  Diagnosis Date   Arthritis    BPH (benign prostatic hypertrophy)    CHF (congestive heart failure) ([place])    Elevated PSA    Eye problem 03/06/2016   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  Elevated LVEDP.;  09/2020: EF 20-25%.  GR 1 DD   Numbness    hands   Personal history of arthritis    Personal history of other diseases of circulatory system    Pre-diabetes    Past Surgical History:  Procedure Laterality Date   ANTERIOR CERVICAL DECOMP/DISCECTOMY FUSION N/A 04/20/2018   Procedure: Anterior  Cervical Decompression Fusion - Cervical Three-Cervical Four - Cervical Four-Cervical Five - Cervical Five-Cervical Six;  Surgeon: Eustace Moore, MD;  Location: [place];  Service: Neurosurgery;  Laterality: N/A;  Anterior Cervical Decompression Fusion - Cervical Three-Cervical Four - Cervical Four-Cervical Five - Cervical Five-Cervical Six   APPENDECTOMY  AS CHILD   BACK SURGERY     BIV ICD INSERTION CRT-D N/A 10/27/2021   Procedure: BIV ICD INSERTION CRT-D;  Surgeon: Evans Lance, MD;  Location: [place] [facility];  Service: Cardiovascular;  Laterality: N/A;   CARDIOVASCULAR STRESS TEST  10/05/1998   MILD GLOBAL HYPOKINESIS AND ISCHEMIAIN ANTEROSEPTAL  AT [facility]/ EF 42%   CARPAL TUNNEL RELEASE Right    CENTRAL LINE INSERTION  10/28/2021   Procedure: CENTRAL LINE INSERTION;  Surgeon: Constance Haw, MD;  Location: [place] [facility];  Service: Cardiovascular;;   CENTRAL LINE INSERTION  10/28/2021   Procedure: CENTRAL LINE INSERTION;  Surgeon: Sherren Mocha, MD;  Location: [place] [facility];  Service: Cardiovascular;;   EXCISION MELANOMA LEFT UPPER BACK  10/14/2009   eye lid surgery Bilateral 2018   GOLD SEED IMPLANT N/A 07/23/2021   Procedure: GOLD SEED IMPLANT;  Surgeon: Ardis Hughs, MD;  Location: [facility];  Service: Urology;  Laterality: N/A;   KNEE ARTHROSCOPY WITH SUBCHONDROPLASTY Left 04/07/2017   Procedure: LEFT KNEE ARTHROSCOPY WITH PARTIAL MEDIAL MENISCECTOMY AND MEDIAL TIBIAL SUBCHONDROPLASTY;  Surgeon: Jean Rosenthal  Y, MD;  Location: [facility];  Service: Orthopedics;  Laterality: Left;   knee injections Bilateral    LEAD REVISION/REPAIR N/A 10/28/2021   Procedure: LEAD REVISION/REPAIR;  Surgeon: Constance Haw, MD;  Location: [place] [facility];  Service: Cardiovascular;  Laterality: N/A;   LEFT HEART CATH AND CORONARY ANGIOGRAPHY  04/2002   minimal irregularities in the LAD/  EF 50%  -   DR AL LITTLE   LUMBAR [place] SURGERY  09/12/2000   left  L5 -- S1    PERICARDIOCENTESIS N/A 10/28/2021   Procedure: PERICARDIOCENTESIS;  Surgeon: Constance Haw, MD;  Location: [place] [facility];  Service: Cardiovascular;  Laterality: N/A;   PERICARDIOCENTESIS N/A 10/28/2021   Procedure: PERICARDIOCENTESIS;  Surgeon: Sherren Mocha, MD;  Location: [place] [facility];  Service: Cardiovascular;  Laterality: N/A;   PROSTATE BIOPSY N/A 05/22/2013   Procedure: BIOPSY TRANSRECTAL ULTRASONIC PROSTATE (TUBP);  Surgeon: [name], MD;  Location[facility];  Service: Urology;  Laterality: N/A;   RIGHT/LEFT HEART CATH AND CORONARY ANGIOGRAPHY N/A 06/25/2020   Procedure: RIGHT/LEFT HEART CATH AND CORONARY ANGIOGRAPHY;  Surgeon: Belva Crome, MD;  Location: [place] [facility];:; Normal coronary arteries; LVEDP 23 mmHg; PCWP 19 mmHg.   ROTATOR CUFF REPAIR Right 2016   SPACE OAR INSTILLATION N/A 07/23/2021   Procedure: SPACE OAR INSTILLATION;  Surgeon: Ardis Hughs, MD;  Location: [facility];  Service: Urology;  Laterality: N/A;   THROAT SURGERY  04/20/2018   TRANSRECTAL ULTRASOUND PROSTATE BX  05-23-2005  &  04-19-2001   TRANSTHORACIC ECHOCARDIOGRAM  09/2017   EF 45-50 % (previously reported as 40 to 45%).  Incoordinate septal motion with mild LVH.  GR 1 DD.  Aortic sclerosis but no stenosis.   TRANSTHORACIC ECHOCARDIOGRAM  10/06/2020   Severely reduced EF of 20 to 25%.  No LV thrombus.  Severe septal-lateral wall systolic dyssynchrony due to LBBB.  Global HK.  Moderately dilated LV.  GR 1 DD with mild LA dilation..  Unable to assess PAP with normal RV size and function.  Normal RAP.  Mild AOV sclerosis but no stenosis.  Mild to moderate MR.   TRANSTHORACIC ECHOCARDIOGRAM  06/16/2020   Severely reduced EF <20%.  Moderate severely dilated LV.  GR 2 DD.  Elevated LVEDP.  Mild LA dilation.  Mildly reduced RV function.  Mild MR.  Mild aortic valve calcification    reports that he quit smoking about 41 years ago. His smoking use included cigarettes. He  has a 40.00 pack-year smoking history. He has never used smokeless tobacco. He reports that he does not drink alcohol and does not use drugs. family history includes Arthritis in his mother; Breast cancer in his sister; COPD in his father; Cancer in his sister; Cardiomyopathy in an other family member; Lung cancer in his father and sister. Allergies  Allergen Reactions   Nitrostat [Nitroglycerin] Anaphylaxis    "Cardiologist suggested he shouldn't take this med because it could kill him with his heart condition. Lowers BP"   Ace Inhibitors Swelling   Mevacor [Lovastatin] Nausea Only   Solarcaine [Benzocaine] Dermatitis      Outpatient Encounter Medications as of 01/10/2022  Medication Sig   acetaminophen (TYLENOL) 500 MG tablet Take 1,000 mg by mouth every 6 (six) hours as needed for moderate pain.   amiodarone (PACERONE) 200 MG tablet Take 1 tablet (200 mg total) by mouth daily.   aspirin EC 81 MG tablet Take 1 tablet (81 mg total) by  mouth daily.   carvedilol (COREG) 3.125 MG tablet TAKE 1 TABLET (3.125 MG TOTAL) 2 TIMES DAILY WITH A MEAL.   colchicine 0.6 MG tablet Take 1 tablet (0.6 mg total) by mouth 2 (two) times daily.   Cyanocobalamin (VITAMIN B-12 PO) Take 1 tablet by mouth daily.   finasteride (PROSCAR) 5 MG tablet Take 5 mg by mouth daily.   furosemide (LASIX) 40 MG tablet TAKE 1 TABLET TWICE DAILY   gabapentin (NEURONTIN) 300 MG capsule Take 1-2 capsules (300-600 mg total) by mouth 3 (three) times daily as needed (pain). (Patient taking differently: Take 600 mg by mouth 2 (two) times daily.)   MAGNESIUM PO Take 1 tablet by mouth daily.   Multiple Vitamin (MULTIVITAMIN WITH MINERALS) TABS tablet Take 1 tablet by mouth daily. One-A-Day Multivitamin   Multiple Vitamins-Minerals (ZINC PO) Take 1 tablet by mouth daily.   naproxen sodium (ALEVE) 220 MG tablet Take 440 mg by mouth daily as needed (pain).   Omega-3 Fatty Acids (FISH OIL PO) Take 1 capsule by mouth daily.    rosuvastatin (CRESTOR) 10 MG tablet TAKE 1 TABLET EVERY DAY   tamsulosin (FLOMAX) 0.4 MG CAPS capsule TAKE 1 CAPSULE EVERY DAY (Patient taking differently: Take 0.4 mg by mouth daily.)   Turmeric (QC TUMERIC COMPLEX PO) Take 1 tablet by mouth daily.   hydrALAZINE (APRESOLINE) 25 MG tablet TAKE 1 TABLET (25 MG TOTAL) BY MOUTH IN THE MORNING AND AT BEDTIME.   Facility-Administered Encounter Medications as of 01/10/2022  Medication   sodium chloride flush (NS) 0.9 % injection 3 mL     REVIEW OF SYSTEMS  : All other systems reviewed and negative except where noted in the History of Present Illness.   PHYSICAL EXAM: BP 128/62   Pulse 68   Ht '5\' 7"'$  (1.702 m)   Wt 183 lb 9.6 oz (83.3 kg)   SpO2 98%   BMI 28.76 kg/m  General: Well developed white male in no acute distress Head: Normocephalic and atraumatic Eyes:  Sclerae anicteric, conjunctiva pink. Ears: Normal auditory acuity Lungs: Clear throughout to auscultation; no W/R/R. Heart: Regular rate and rhythm; no M/R/G. Abdomen: Soft, non-distended.  BS present.  Non-tender. Rectal:  Will be done at the time of colonoscopy. Musculoskeletal: Symmetrical with no gross deformities  Skin: No lesions on visible extremities Extremities: No edema  Neurological: Alert oriented x 4, grossly non-focal Psychological:  Alert and cooperative. Normal mood and affect  ASSESSMENT AND PLAN: *Personal history of colon polyps: Last colonoscopy February 2017 showed a subcentimeter tubular adenoma.  Recall for 7 years will be due in February.  He would like to have it done before the end of the year for insurance purposes.  Needs to be done at [facility] due to low EF of 25 to 30%.  We will schedule at [facility] once we can work out with the schedule.  CC:  [name], MD

## 2022-01-13 ENCOUNTER — Telehealth: Payer: Self-pay | Admitting: *Deleted

## 2022-01-13 ENCOUNTER — Other Ambulatory Visit: Payer: Self-pay | Admitting: *Deleted

## 2022-01-13 DIAGNOSIS — Z86010 Personal history of colonic polyps: Secondary | ICD-10-CM

## 2022-01-13 NOTE — Telephone Encounter (Signed)
   Name: [name] [name]  DOB: 1945-11-20  MRN: [id number]  Primary Cardiologist: Glenetta Hew, MD   Preoperative team, please contact this patient and set up a phone call appointment for further preoperative risk assessment. Please obtain consent and complete medication review. Thank you for your help.  I confirm that guidance regarding antiplatelet and oral anticoagulation therapy has been completed and, if necessary, noted below.  No request for holding blood thinning/DOAC agents.   Deberah Pelton, NP 01/13/2022, 10:57 AM [place]

## 2022-01-13 NOTE — Telephone Encounter (Signed)
Pt has been scheduled for a tele visit, tomorrow, 01/14/22 9:20.  Consent on file / medications reconciled.

## 2022-01-13 NOTE — Telephone Encounter (Signed)
Unable to schedule patient for 10/30. Patient scheduled for 12/18 @ 10 am @ [facility].

## 2022-01-13 NOTE — Telephone Encounter (Signed)
[facility] Pre-operative Risk Assessment     Request for surgical clearance:     Endoscopy Procedure  What type of surgery is being performed?     Colonoscopy  When is this surgery scheduled?     01/24/22  What type of clearance is required ?   Cardiac   Practice name and name of physician performing surgery?      [facility]  What is your office phone and fax number?      Phone- [phone]  [phone]  Anesthesia type (None, local, MAC, general) ?       MAC

## 2022-01-13 NOTE — Telephone Encounter (Signed)
Pt has been scheduled for a tele visit, tomorrow, 01/14/22 9:20.  Consent on file / medications reconciled.    Patient Consent for Virtual Visit        [name] [name] has provided verbal consent on 01/13/2022 for a virtual visit (video or telephone).   CONSENT FOR VIRTUAL VISIT FOR:  [name] [name]  By participating in this virtual visit I agree to the following:  I hereby voluntarily request, consent and authorize [facility] and its employed or contracted physicians, physician assistants, nurse practitioners or other licensed health care professionals (the Practitioner), to provide me with telemedicine health care services (the "Services") as deemed necessary by the treating Practitioner. I acknowledge and consent to receive the Services by the Practitioner via telemedicine. I understand that the telemedicine visit will involve communicating with the Practitioner through live audiovisual communication technology and the disclosure of certain medical information by electronic transmission. I acknowledge that I have been given the opportunity to request an in-person assessment or other available alternative prior to the telemedicine visit and am voluntarily participating in the telemedicine visit.  I understand that I have the right to withhold or withdraw my consent to the use of telemedicine in the course of my care at any time, without affecting my right to future care or treatment, and that the Practitioner or I may terminate the telemedicine visit at any time. I understand that I have the right to inspect all information obtained and/or recorded in the course of the telemedicine visit and may receive copies of available information for a reasonable fee.  I understand that some of the potential risks of receiving the Services via telemedicine include:  Delay or interruption in medical evaluation due to technological equipment failure or disruption; Information transmitted  may not be sufficient (e.g. poor resolution of images) to allow for appropriate medical decision making by the Practitioner; and/or  In rare instances, security protocols could fail, causing a breach of personal health information.  Furthermore, I acknowledge that it is my responsibility to provide information about my medical history, conditions and care that is complete and accurate to the best of my ability. I acknowledge that Practitioner's advice, recommendations, and/or decision may be based on factors not within their control, such as incomplete or inaccurate data provided by me or distortions of diagnostic images or specimens that may result from electronic transmissions. I understand that the practice of medicine is not an exact science and that Practitioner makes no warranties or guarantees regarding treatment outcomes. I acknowledge that a copy of this consent can be made available to me via my patient portal ([facility]), or I can request a printed copy by calling the office of [place].    I understand that my insurance will be billed for this visit.   I have read or had this consent read to me. I understand the contents of this consent, which adequately explains the benefits and risks of the Services being provided via telemedicine.  I have been provided ample opportunity to ask questions regarding this consent and the Services and have had my questions answered to my satisfaction. I give my informed consent for the services to be provided through the use of telemedicine in my medical care

## 2022-01-13 NOTE — Telephone Encounter (Signed)
-----   Message from Loralie Champagne, PA-C sent at 01/13/2022  8:49 AM EDT ----- Regarding: FW: Hospital colonoscopy Veena wants cardiac clearance on him, but otherwise said it was fine to use that spot at [place] on 10/30 if we can get the clearance quickly.  ----- Message ----- From: Mauri Pole, MD Sent: 01/12/2022  11:28 AM EDT To: Loralie Champagne, PA-C Subject: RE: Hospital colonoscopy                       It is fine but he will need cardiac clearance prior to the procedure.  Thank you ----- Message ----- From: Loralie Champagne, PA-C Sent: 01/10/2022  12:42 PM EDT To: Mauri Pole, MD Subject: Hospital colonoscopy                           This is a patient of Dr. Ardis Hughs.  He is due for recall colon in February.  Has a low EF with a defibrillator so needs to be done at the hospital.  Understands that he is high risk, but would like to proceed and would like to have it done by the end of the year for insurance purposes.  You have an upcoming opening I believe later this month on the 30th so I wanted to see if you would be willing to do this for the patient at that time?  Thank you,  Janett Billow

## 2022-01-14 ENCOUNTER — Ambulatory Visit: Payer: Medicare HMO | Attending: Cardiovascular Disease | Admitting: General Practice

## 2022-01-14 DIAGNOSIS — Z01810 Encounter for preprocedural cardiovascular examination: Secondary | ICD-10-CM

## 2022-01-14 NOTE — Progress Notes (Signed)
Virtual Visit via Telephone Note   Because of [name] [name]'s co-morbid illnesses, he is at least at moderate risk for complications without adequate follow up.  This format is felt to be most appropriate for this patient at this time.  The patient did not have access to video technology/had technical difficulties with video requiring transitioning to audio format only (telephone).  All issues noted in this document were discussed and addressed.  No physical exam could be performed with this format.  Please refer to the patient's chart for his consent to telehealth for [facility]. Evaluation Performed:  Preoperative cardiovascular risk assessment _____________   Date:  01/14/2022   Patient ID:  [name] [name], DOB 1945/05/29, MRN [id number] Patient Location:  Home Provider location:   Office  Primary Care Provider:  [name], MD Primary Cardiologist:  Glenetta Hew, MD  Chief Complaint / Patient Profile   76 y.o. y/o male with a h/o chronic HFrEF, LBBB who is pending colonoscopy and presents today for telephonic preoperative cardiovascular risk assessment.  Past Medical History    Past Medical History:  Diagnosis Date   Arthritis    BPH (benign prostatic hypertrophy)    CHF (congestive heart failure) ([place])    Elevated PSA    Eye problem 03/06/2016   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  Elevated LVEDP.;  09/2020: EF 20-25%.  GR 1 DD   Numbness    hands   Personal history of arthritis     Personal history of other diseases of circulatory system    Pre-diabetes    Past Surgical History:  Procedure Laterality Date   ANTERIOR CERVICAL DECOMP/DISCECTOMY FUSION N/A 04/20/2018   Procedure: Anterior Cervical Decompression Fusion - Cervical Three-Cervical Four - Cervical Four-Cervical Five - Cervical Five-Cervical Six;  Surgeon: Eustace Moore, MD;  Location: [facility];  Service: Neurosurgery;  Laterality: N/A;  Anterior Cervical Decompression Fusion - Cervical Three-Cervical Four - Cervical Four-Cervical Five - Cervical Five-Cervical Six   APPENDECTOMY  AS CHILD   BACK SURGERY     BIV ICD INSERTION CRT-D N/A 10/27/2021   Procedure: BIV ICD INSERTION CRT-D;  Surgeon: Evans Lance, MD;  Location: [place] [facility];  Service: Cardiovascular;  Laterality: N/A;   CARDIOVASCULAR STRESS TEST  10/05/1998   MILD GLOBAL HYPOKINESIS AND ISCHEMIAIN ANTEROSEPTAL  AT APEX/ EF 42%   CARPAL TUNNEL RELEASE Right    CENTRAL LINE INSERTION  10/28/2021   Procedure: CENTRAL LINE INSERTION;  Surgeon: Constance Haw, MD;  Location: [place] [facility];  Service: Cardiovascular;;   CENTRAL LINE INSERTION  10/28/2021   Procedure: CENTRAL LINE INSERTION;  Surgeon: Sherren Mocha, MD;  Location: [place] [facility];  Service: Cardiovascular;;   EXCISION MELANOMA LEFT UPPER BACK  10/14/2009   eye lid surgery Bilateral 2018   GOLD SEED IMPLANT N/A 07/23/2021   Procedure: GOLD SEED IMPLANT;  Surgeon: Ardis Hughs, MD;  [facility];  Service: Urology;  Laterality: N/A;   KNEE ARTHROSCOPY WITH SUBCHONDROPLASTY Left 04/07/2017   Procedure: LEFT KNEE ARTHROSCOPY WITH PARTIAL MEDIAL MENISCECTOMY AND MEDIAL TIBIAL SUBCHONDROPLASTY;  Surgeon: Mcarthur Rossetti, MD;  [facility];  Service: Orthopedics;  Laterality: Left;   knee injections Bilateral    LEAD REVISION/REPAIR N/A 10/28/2021   Procedure: LEAD REVISION/REPAIR;  Surgeon: Constance Haw, MD;  Location: [place];  Service:  Cardiovascular;  Laterality: N/A;   LEFT HEART CATH AND CORONARY ANGIOGRAPHY  04/2002   minimal irregularities in the LAD/  EF 50%  -   DR AL LITTLE   LUMBAR [place] SURGERY  09/12/2000   left  L5 -- S1   PERICARDIOCENTESIS N/A 10/28/2021   Procedure: PERICARDIOCENTESIS;  Surgeon: Constance Haw, MD;  Location: [place] [facility];  Service: Cardiovascular;  Laterality: N/A;   PERICARDIOCENTESIS N/A 10/28/2021   Procedure: PERICARDIOCENTESIS;  Surgeon: Sherren Mocha, MD;  Location: [place] [facility];  Service: Cardiovascular;  Laterality: N/A;   PROSTATE BIOPSY N/A 05/22/2013   Procedure: BIOPSY TRANSRECTAL ULTRASONIC PROSTATE (TUBP);  Surgeon: [name], MD;  Location: [facility];  Service: Urology;  Laterality: N/A;   RIGHT/LEFT HEART CATH AND CORONARY ANGIOGRAPHY N/A 06/25/2020   Procedure: RIGHT/LEFT HEART CATH AND CORONARY ANGIOGRAPHY;  Surgeon: Belva Crome, MD;  Location: [facility];:; Normal coronary arteries; LVEDP 23 mmHg; PCWP 19 mmHg.   ROTATOR CUFF REPAIR Right 2016   SPACE OAR INSTILLATION N/A 07/23/2021   Procedure: SPACE OAR INSTILLATION;  Surgeon: Ardis Hughs, MD;  [facility];  Service: Urology;  Laterality: N/A;   THROAT SURGERY  04/20/2018   TRANSRECTAL ULTRASOUND PROSTATE BX  05-23-2005  &  04-19-2001   TRANSTHORACIC ECHOCARDIOGRAM  09/2017   EF 45-50 % (previously reported as 40 to 45%).  Incoordinate septal motion with mild LVH.  GR 1 DD.  Aortic sclerosis but no stenosis.   TRANSTHORACIC ECHOCARDIOGRAM  10/06/2020   Severely reduced EF of 20 to 25%.  No LV thrombus.  Severe septal-lateral wall systolic dyssynchrony due to LBBB.  Global HK.  Moderately dilated LV.  GR 1 DD with mild LA dilation..  Unable to assess PAP with normal RV size and function.  Normal RAP.  Mild AOV sclerosis but no stenosis.  Mild to moderate MR.   TRANSTHORACIC ECHOCARDIOGRAM  06/16/2020   Severely reduced EF <20%.  Moderate severely dilated LV.  GR  2 DD.  Elevated LVEDP.  Mild LA dilation.  Mildly reduced RV function.  Mild MR.  Mild aortic valve calcification    Allergies  Allergies  Allergen Reactions   Nitrostat [Nitroglycerin] Anaphylaxis    "Cardiologist suggested he shouldn't take this med because it could kill him with his heart condition. Lowers BP"   Ace Inhibitors Swelling   Mevacor [Lovastatin] Nausea Only   Solarcaine [Benzocaine] Dermatitis    History of Present Illness    Nickolai Rinks is a 76 y.o. male who presents via audio/video conferencing for a telehealth visit today.  Pt was last seen in cardiology clinic on 10/01/2021 by Dr. Lovena Le.  At that time Andros Channing was doing well and discussion of revising his [facility] Jude device was reviewed.  He underwent revision of his RV ICD which was complicated by hypotension and large pericardial effusion with tamponade.  He received an emergent pericardial drain.  His echocardiogram 10/31/2021 showed only trivial pericardial effusion.  He was placed on a 6-week course of colchicine.  He  was discharged in stable condition on 11/02/2021.  The patient is now pending procedure as outlined above. Since his last visit, he remains stable from a cardiac standpoint.  Today he denies chest pain, shortness of breath, lower extremity edema, fatigue, palpitations, melena, hematuria, hemoptysis, diaphoresis, weakness, presyncope, syncope, orthopnea, and PND.   Home Medications    Prior to Admission medications   Medication Sig Start Date End Date Taking? Authorizing Provider  acetaminophen (TYLENOL) 500 MG tablet Take 1,000 mg by mouth every 6 (six) hours as needed for moderate pain.    [provider]  amiodarone (PACERONE) 200 MG tablet Take 1 tablet (200 mg total) by mouth daily. 11/11/21   Shirley Friar, PA-C  aspirin EC 81 MG tablet Take 1 tablet (81 mg total) by mouth daily. 04/26/18   Costella, Vista Mink, PA-C  carvedilol (COREG) 3.125 MG tablet TAKE 1 TABLET  (3.125 MG TOTAL) 2 TIMES DAILY WITH A MEAL. 12/01/21   Leonie Man, MD  Cyanocobalamin (VITAMIN B-12 PO) Take 1 tablet by mouth daily.    [provider]  finasteride (PROSCAR) 5 MG tablet Take 5 mg by mouth daily. 05/18/21   [provider]  furosemide (LASIX) 40 MG tablet TAKE 1 TABLET TWICE DAILY Patient taking differently: Take 40 mg by mouth 2 (two) times a week. 01/10/22   Leonie Man, MD  gabapentin (NEURONTIN) 300 MG capsule Take 1-2 capsules (300-600 mg total) by mouth 3 (three) times daily as needed (pain). Patient taking differently: Take 600 mg by mouth 2 (two) times daily. 02/26/21   [name], MD  hydrALAZINE (APRESOLINE) 25 MG tablet TAKE 1 TABLET (25 MG TOTAL) BY MOUTH IN THE MORNING AND AT BEDTIME. 12/01/21   Leonie Man, MD  MAGNESIUM PO Take 1 tablet by mouth daily.    [provider]  Multiple Vitamin (MULTIVITAMIN WITH MINERALS) TABS tablet Take 1 tablet by mouth daily. One-A-Day Multivitamin    [provider]  Multiple Vitamins-Minerals (ZINC PO) Take 1 tablet by mouth daily.    [provider]  naproxen sodium (ALEVE) 220 MG tablet Take 440 mg by mouth daily as needed (pain).    [provider]  Omega-3 Fatty Acids (FISH OIL PO) Take 1 capsule by mouth daily.    [provider]  rosuvastatin (CRESTOR) 10 MG tablet TAKE 1 TABLET EVERY DAY 12/27/21   [name], MD  tamsulosin (FLOMAX) 0.4 MG CAPS capsule TAKE 1 CAPSULE EVERY DAY Patient taking differently: Take 0.4 mg by mouth daily. 08/03/21   [name], MD  Turmeric (QC TUMERIC COMPLEX PO) Take 1 tablet by mouth daily.    [provider]    Physical Exam    Vital Signs:  Sherri Levenhagen does not have vital signs available for review today.  Given telephonic nature of communication, physical exam is limited. AAOx3. NAD. Normal affect.  Speech and respirations are unlabored.  Accessory Clinical Findings     None  Assessment & Plan    1.  Preoperative Cardiovascular Risk Assessment: Colonoscopy, [place] [facility]    Primary Cardiologist: Glenetta Hew, MD  Chart reviewed as part of pre-operative protocol coverage. Given past medical history and time since last visit, based on ACC/AHA guidelines, Mousa Prout would be at acceptable risk for the planned procedure without further cardiovascular testing.   Patient was advised that if he develops new symptoms prior to surgery to contact our office to arrange a follow-up appointment.  He verbalized understanding.  I will route this recommendation to the requesting party via Epic fax function and remove from pre-op pool.    A copy of this note will be routed to requesting surgeon.  Time:   Today, I have spent 6 minutes with the patient with telehealth technology discussing medical history, symptoms, and management plan.  Prior to his phone evaluation aspect greater than 10 minutes reviewing his past medical history and cardiac medications.   Deberah Pelton, NP  01/14/2022, 7:25 AM

## 2022-01-14 NOTE — Telephone Encounter (Signed)
Patient informed of change in procedure date.

## 2022-01-17 NOTE — Telephone Encounter (Signed)
Instructions sent via Mychart

## 2022-01-21 ENCOUNTER — Encounter: Payer: Self-pay | Admitting: *Deleted

## 2022-01-26 LAB — CUP PACEART REMOTE DEVICE CHECK
Battery Remaining Longevity: 50 mo
Battery Remaining Percentage: 90 %
Battery Voltage: 3.1 V
Brady Statistic AP VP Percent: 2.5 %
Brady Statistic AP VS Percent: 1 %
Brady Statistic AS VP Percent: 97 %
Brady Statistic AS VS Percent: 1 %
Brady Statistic RA Percent Paced: 2.5 %
Date Time Interrogation Session: 20231101040014
HighPow Impedance: 70 Ohm
HighPow Impedance: 70 Ohm
Implantable Lead Connection Status: 753985
Implantable Lead Connection Status: 753985
Implantable Lead Connection Status: 753985
Implantable Lead Implant Date: 20230802
Implantable Lead Implant Date: 20230802
Implantable Lead Implant Date: 20230802
Implantable Lead Location: 753859
Implantable Lead Location: 753860
Implantable Lead Location: 753860
Implantable Lead Model: 7122
Implantable Pulse Generator Implant Date: 20230802
Lead Channel Impedance Value: 260 Ohm
Lead Channel Impedance Value: 450 Ohm
Lead Channel Impedance Value: 460 Ohm
Lead Channel Pacing Threshold Amplitude: 0.75 V
Lead Channel Pacing Threshold Amplitude: 0.75 V
Lead Channel Pacing Threshold Amplitude: 0.75 V
Lead Channel Pacing Threshold Pulse Width: 0.05 ms
Lead Channel Pacing Threshold Pulse Width: 0.5 ms
Lead Channel Pacing Threshold Pulse Width: 0.5 ms
Lead Channel Sensing Intrinsic Amplitude: 11.3 mV
Lead Channel Sensing Intrinsic Amplitude: 2.8 mV
Lead Channel Setting Pacing Amplitude: 0.25 V
Lead Channel Setting Pacing Amplitude: 3.5 V
Lead Channel Setting Pacing Amplitude: 3.5 V
Lead Channel Setting Pacing Pulse Width: 0.05 ms
Lead Channel Setting Pacing Pulse Width: 0.5 ms
Lead Channel Setting Sensing Sensitivity: 0.5 mV
Pulse Gen Serial Number: 5544603

## 2022-01-27 ENCOUNTER — Ambulatory Visit (INDEPENDENT_AMBULATORY_CARE_PROVIDER_SITE_OTHER): Payer: Medicare HMO

## 2022-01-27 DIAGNOSIS — I428 Other cardiomyopathies: Secondary | ICD-10-CM

## 2022-01-28 ENCOUNTER — Inpatient Hospital Stay: Payer: Medicare HMO | Attending: Urology | Admitting: *Deleted

## 2022-01-28 DIAGNOSIS — C61 Malignant neoplasm of prostate: Secondary | ICD-10-CM

## 2022-01-28 NOTE — Progress Notes (Signed)
2 Identifiers were used for verification purposes ony. No vitals were taken as this was a telephone visit. Pt denies pain today. He states the fatigue he was feeling after treatment is now resolved. Pt says he is getting up to bathroom at bedtime about 2-3 times a night to urinate and this is no different than before treatment. Pt states the daytime urinary frequency is less. No issues with bowels. Pt denies smoking and drinking. Last wellness check at PCP was 10/2021. Last colonoscopy was in 2017. Recall will be 02/2022. Pt is seen by dermatologist yearly for skin checks. Vaccines reviewed and updated. Nutrition and exercise was discussed.  "Nutrition Rainbow" given in packet. Pt says, he feels like he is doing very well.  SCP reviewed and completed.

## 2022-02-01 ENCOUNTER — Telehealth: Payer: Self-pay

## 2022-02-01 ENCOUNTER — Ambulatory Visit: Payer: Medicare HMO | Attending: Internal Medicine | Admitting: Internal Medicine

## 2022-02-01 ENCOUNTER — Encounter: Payer: Self-pay | Admitting: Internal Medicine

## 2022-02-01 VITALS — BP 110/68 | HR 78 | Ht 67.0 in | Wt 183.6 lb

## 2022-02-01 DIAGNOSIS — Z95810 Presence of automatic (implantable) cardiac defibrillator: Secondary | ICD-10-CM | POA: Insufficient documentation

## 2022-02-01 DIAGNOSIS — I5022 Chronic systolic (congestive) heart failure: Secondary | ICD-10-CM

## 2022-02-01 DIAGNOSIS — I428 Other cardiomyopathies: Secondary | ICD-10-CM

## 2022-02-01 NOTE — Progress Notes (Signed)
HPI Mr. Vandegrift returns today s/p biv ICD. The patient is a pleasant 76 yo man with chronic systolic heart failure and a non-ischemic CM, and LBBB. The patient has not had syncope. I discussed BIV ICD vs biv PM insertion over a year ago and he has undergone watchful waiting. He has class 2 CHF symptoms. He has done well since his implant. He went deer hunting last weekend and had to walk a mile with gun in hand.     Current Outpatient Medications  Medication Sig Dispense Refill   acetaminophen (TYLENOL) 500 MG tablet Take 1,000 mg by mouth every 6 (six) hours as needed for moderate pain.     amiodarone (PACERONE) 200 MG tablet Take 1 tablet (200 mg total) by mouth daily. 90 tablet 3   aspirin EC 81 MG tablet Take 1 tablet (81 mg total) by mouth daily.     carvedilol (COREG) 3.125 MG tablet TAKE 1 TABLET (3.125 MG TOTAL) 2 TIMES DAILY WITH A MEAL. 180 tablet 3   Cyanocobalamin (VITAMIN B-12 PO) Take 1 tablet by mouth daily.     FINASTERIDE PO Take 5 mg by mouth daily.     furosemide (LASIX) 40 MG tablet TAKE 1 TABLET TWICE DAILY (Patient taking differently: Take 40 mg by mouth 2 (two) times a week.) 120 tablet 10   gabapentin (NEURONTIN) 300 MG capsule Take 1-2 capsules (300-600 mg total) by mouth 3 (three) times daily as needed (pain). (Patient taking differently: Take 600 mg by mouth 2 (two) times daily.) 540 capsule 1   hydrALAZINE (APRESOLINE) 25 MG tablet TAKE 1 TABLET (25 MG TOTAL) BY MOUTH IN THE MORNING AND AT BEDTIME. 60 tablet 11   MAGNESIUM PO Take 1 tablet by mouth daily.     Multiple Vitamin (MULTIVITAMIN WITH MINERALS) TABS tablet Take 1 tablet by mouth daily. One-A-Day Multivitamin     Multiple Vitamins-Minerals (ZINC PO) Take 1 tablet by mouth daily.     naproxen sodium (ALEVE) 220 MG tablet Take 440 mg by mouth daily as needed (pain).     Omega-3 Fatty Acids (FISH OIL PO) Take 1 capsule by mouth daily.     rosuvastatin (CRESTOR) 10 MG tablet TAKE 1 TABLET EVERY DAY 90  tablet 3   tamsulosin (FLOMAX) 0.4 MG CAPS capsule TAKE 1 CAPSULE EVERY DAY (Patient taking differently: Take 0.4 mg by mouth daily.) 90 capsule 3   Turmeric (QC TUMERIC COMPLEX PO) Take 1 tablet by mouth daily.     Current Facility-Administered Medications  Medication Dose Route Frequency Provider Last Rate Last Admin   sodium chloride flush (NS) 0.9 % injection 3 mL  3 mL Intravenous Q12H Deberah Pelton, NP         Past Medical History:  Diagnosis Date   Arthritis    BPH (benign prostatic hypertrophy)    CHF (congestive heart failure) ([facility])    Elevated PSA    Eye problem 03/06/2016   [name] retinal detachment left eye, no sx done   History of gout    pt 05-20-2013 states stable    History of melanoma excision    2011-- LEFT UPPER BACK   Hyperlipemia    Hypertriglyceridemia    ICD (implantable cardioverter-defibrillator) battery depletion 10/27/2021   Left bundle branch block 08/2017   Concern for LBBB mediated cardiomyopathy   Melanoma ([place]) 2011   back   Nonischemic cardiomyopathy ([place]) 09/2020   08/2009: EF 40-45%, Global HK-> 09/2017 -> EF 45-50% ;  3/'22: EF<20%.  Elevated LVEDP.;  09/2020: EF 20-25%.  GR 1 DD   Numbness    hands   Personal history of arthritis    Personal history of other diseases of circulatory system    Pre-diabetes     ROS:   All systems reviewed and negative except as noted in the HPI.   Past Surgical History:  Procedure Laterality Date   ANTERIOR CERVICAL DECOMP/DISCECTOMY FUSION N/A 04/20/2018   Procedure: Anterior Cervical Decompression Fusion - Cervical Three-Cervical Four - Cervical Four-Cervical Five - Cervical Five-Cervical Six;  Surgeon: Eustace Moore, MD;  Location: [facility];  Service: Neurosurgery;  Laterality: N/A;  Anterior Cervical Decompression Fusion - Cervical Three-Cervical Four - Cervical Four-Cervical Five - Cervical Five-Cervical Six   APPENDECTOMY  AS CHILD   BACK SURGERY     BIV ICD INSERTION CRT-D N/A 10/27/2021    Procedure: BIV ICD INSERTION CRT-D;  Surgeon: Evans Lance, MD;  Location: [facility];  Service: Cardiovascular;  Laterality: N/A;   CARDIOVASCULAR STRESS TEST  10/05/1998   MILD GLOBAL HYPOKINESIS AND ISCHEMIAIN ANTEROSEPTAL  AT APEX/ EF 42%   CARPAL TUNNEL RELEASE Right    CENTRAL LINE INSERTION  10/28/2021   Procedure: CENTRAL LINE INSERTION;  Surgeon: Constance Haw, MD;  Location: [facility];  Service: Cardiovascular;;   CENTRAL LINE INSERTION  10/28/2021   Procedure: CENTRAL LINE INSERTION;  Surgeon: Sherren Mocha, MD;  Location: [facility];  Service: Cardiovascular;;   EXCISION MELANOMA LEFT UPPER BACK  10/14/2009   eye lid surgery Bilateral 2018   GOLD SEED IMPLANT N/A 07/23/2021   Procedure: GOLD SEED IMPLANT;  Surgeon: Ardis Hughs, MD;  [facility];  Service: Urology;  Laterality: N/A;   KNEE ARTHROSCOPY WITH SUBCHONDROPLASTY Left 04/07/2017   Procedure: LEFT KNEE ARTHROSCOPY WITH PARTIAL MEDIAL MENISCECTOMY AND MEDIAL TIBIAL SUBCHONDROPLASTY;  Surgeon: Mcarthur Rossetti, MD;  [facility];  Service: Orthopedics;  Laterality: Left;   knee injections Bilateral    LEAD REVISION/REPAIR N/A 10/28/2021   Procedure: LEAD REVISION/REPAIR;  Surgeon: Constance Haw, MD;  Location: [facility];  Service: Cardiovascular;  Laterality: N/A;   LEFT HEART CATH AND CORONARY ANGIOGRAPHY  04/2002   minimal irregularities in the LAD/  EF 50%  -   DR AL LITTLE   LUMBAR [place] SURGERY  09/12/2000   left  L5 -- S1   PERICARDIOCENTESIS N/A 10/28/2021   Procedure: PERICARDIOCENTESIS;  Surgeon: Constance Haw, MD;  Location: [facility];  Service: Cardiovascular;  Laterality: N/A;   PERICARDIOCENTESIS N/A 10/28/2021   Procedure: PERICARDIOCENTESIS;  Surgeon: Sherren Mocha, MD;  Location: [facility];  Service: Cardiovascular;  Laterality: N/A;   PROSTATE BIOPSY N/A 05/22/2013   Procedure: BIOPSY TRANSRECTAL ULTRASONIC  PROSTATE (TUBP);  Surgeon: [name], MD;  [facility];  Service: Urology;  Laterality: N/A;   RIGHT/LEFT HEART CATH AND CORONARY ANGIOGRAPHY N/A 06/25/2020   Procedure: RIGHT/LEFT HEART CATH AND CORONARY ANGIOGRAPHY;  Surgeon: Belva Crome, MD;  Location: [facility] [facility];:; Normal coronary arteries; LVEDP 23 mmHg; PCWP 19 mmHg.   ROTATOR CUFF REPAIR Right 2016   SPACE OAR INSTILLATION N/A 07/23/2021   Procedure: SPACE OAR INSTILLATION;  Surgeon: Ardis Hughs, MD;  [facility];  Service: Urology;  Laterality: N/A;   THROAT SURGERY  04/20/2018   TRANSRECTAL ULTRASOUND PROSTATE BX  05-23-2005  &  04-19-2001   TRANSTHORACIC ECHOCARDIOGRAM  09/2017   EF 45-50 % (previously reported as 40 to 45%).  Incoordinate septal motion with mild LVH.  GR 1 DD.  Aortic sclerosis but no stenosis.   TRANSTHORACIC ECHOCARDIOGRAM  10/06/2020   Severely reduced EF of 20 to 25%.  No LV thrombus.  Severe septal-lateral wall systolic dyssynchrony due to LBBB.  Global HK.  Moderately dilated LV.  GR 1 DD with mild LA dilation..  Unable to assess PAP with normal RV size and function.  Normal RAP.  Mild AOV sclerosis but no stenosis.  Mild to moderate MR.   TRANSTHORACIC ECHOCARDIOGRAM  06/16/2020   Severely reduced EF <20%.  Moderate severely dilated LV.  GR 2 DD.  Elevated LVEDP.  Mild LA dilation.  Mildly reduced RV function.  Mild MR.  Mild aortic valve calcification     Family History  Problem Relation Age of Onset   Arthritis Mother    COPD Father    Lung cancer Father    Cancer Sister        type unknown   Breast cancer Sister    Lung cancer Sister    Cardiomyopathy Other        idiopathic. trivial disease 2004 cath, 2010 cardiac CT - no sig. dz, nl LV fxn. 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  Tobacco Use   Smoking status: Former    Packs/day: 2.00    Years: 20.00    Total pack years: 40.00    Types: Cigarettes    Quit date: 03/28/1980    Years since quitting: 41.8   Smokeless tobacco: Never  Vaping Use   Vaping Use: Never used  Substance and Sexual Activity   Alcohol use: No    Alcohol/week: 0.0 standard drinks of alcohol   Drug use: No   Sexual activity: Not on file  Other Topics Concern   Not on file  Social History Narrative   He walks on a relatively regular basis about 15 minutes at a time mostly to the discomfort. He previously had walked up a 30-40 minutes a time without problems. Education: [employer].   Lives alone.   Right-handed.   One cup coffee daily.   Social Determinants of Health   Financial Resource Strain: Low Risk  (11/23/2021)   Overall Financial Resource Strain (CARDIA)    Difficulty of Paying Living Expenses: Not hard at all  Food Insecurity: No Food Insecurity (11/23/2021)   Hunger Vital Sign    Worried About Running Out of Food in the Last Year: Never true    Ran Out of Food in the Last Year: Never true  Transportation Needs: No Transportation Needs (11/23/2021)   PRAPARE - Hydrologist (Medical): No    Lack of Transportation (Non-Medical): No  Physical Activity: Insufficiently Active (11/23/2021)   Exercise Vital Sign    Days of Exercise per Week: 3 days    Minutes of Exercise per Session: 30 min  Stress: No Stress Concern Present (11/23/2021)   [place]    Feeling of Stress : Not at all  Social Connections:  Socially Integrated (11/23/2021)   Social Connection and Isolation Panel [NHANES]    Frequency of Communication with Friends and Family: More than three times a week    Frequency of Social  Gatherings with Friends and Family: More than three times a week    Attends Religious Services: More than 4 times per year    Active Member of [employer] or Organizations: Yes    Attends Archivist Meetings: More than 4 times per year    Marital Status: Living with partner  Intimate Partner Violence: Not At Risk (11/23/2021)   Humiliation, Afraid, Rape, and Kick questionnaire    Fear of Current or Ex-Partner: No    Emotionally Abused: No    Physically Abused: No    Sexually Abused: No     BP 110/68   Pulse 78   Ht '5\' 7"'$  (1.702 m)   Wt 183 lb 9.6 oz (83.3 kg)   SpO2 93%   BMI 28.76 kg/m   Physical Exam:  Well appearing NAD HEENT: Unremarkable Neck:  No JVD, no thyromegally Lymphatics:  No adenopathy Back:  No CVA tenderness Lungs:  Clear HEART:  Regular rate rhythm, no murmurs, no rubs, no clicks Abd:  soft, positive bowel sounds, no organomegally, no rebound, no guarding Ext:  2 plus pulses, no edema, no cyanosis, no clubbing Skin:  No rashes no nodules Neuro:  CN II through XII intact, motor grossly intact  EKG  DEVICE  Normal device function.  See PaceArt for details.   Assess/Plan:  Chronic systolic heart failure - he is doing well after biv ICD insertion. His QRS narrowed about 30 ms. I'll defer uptitration of his medical therapy to his primary cardiology team. ICD - his [place] ICD is working normally.  HTN - his bp is well controlled.  Atrial fib -he is maintaining NSR.   [name] [name] [name] Bernales,MD

## 2022-02-01 NOTE — Patient Instructions (Signed)
Medication Instructions:  Your physician recommends that you continue on your current medications as directed. Please refer to the Current Medication list given to you today.  *If you need a refill on your cardiac medications before your next appointment, please call your pharmacy*  Lab Work: None ordered.  If you have labs (blood work) drawn today and your tests are completely normal, you will receive your results only by: [place] (if you have MyChart) OR A paper copy in the mail If you have any lab test that is abnormal or we need to change your treatment, we will call you to review the results.  Testing/Procedures: None ordered.  Follow-Up: At [facility], you and your health needs are our priority.  As part of our continuing mission to provide you with exceptional heart care, we have created designated Provider Care Teams.  These Care Teams include your primary Cardiologist (physician) and Advanced Practice Providers (APPs -  Physician Assistants and Nurse Practitioners) who all work together to provide you with the care you need, when you need it.  We recommend signing up for the patient portal called "MyChart".  Sign up information is provided on this After Visit Summary.  MyChart is used to connect with patients for Virtual Visits (Telemedicine).  Patients are able to view lab/test results, encounter notes, upcoming appointments, etc.  Non-urgent messages can be sent to your provider as well.   To learn more about what you can do with MyChart, go to [email or web address].    Your next appointment:   1 year(s)  The format for your next appointment:   In Person  Provider:   Cristopher Peru, MD{or one of the following Advanced Practice Providers on your designated Care Team:   Tommye Standard, [place] Legrand Como "Jonni Sanger" Chalmers Cater, [place]  Remote monitoring is used to monitor your ICD from home. This monitoring reduces the number of office visits required to check your device to one  time per year. It allows [place] to keep an eye on the functioning of your device to ensure it is working properly. You are scheduled for a device check from home on 04/28/22. You may send your transmission at any time that day. If you have a wireless device, the transmission will be sent automatically. After your physician reviews your transmission, you will receive a postcard with your next transmission date.  Important Information About [place]

## 2022-02-01 NOTE — Telephone Encounter (Signed)
Left message for patient to return call to schedule 6 month follow up, dx hx of colon polyps.  Will continue efforts.

## 2022-02-01 NOTE — Telephone Encounter (Signed)
-----   Message from [place] sent at 09/22/2021  8:59 AM EDT ----- Regarding: December 2023 6 month, DJ, dx hx of colon polyps,

## 2022-02-02 ENCOUNTER — Telehealth: Payer: Self-pay

## 2022-02-02 LAB — CUP PACEART INCLINIC DEVICE CHECK
Battery Remaining Longevity: 66 mo
Brady Statistic RA Percent Paced: 3 %
Brady Statistic RV Percent Paced: 99.63 %
Date Time Interrogation Session: 20231107153700
HighPow Impedance: 66.375
Implantable Lead Connection Status: 753985
Implantable Lead Connection Status: 753985
Implantable Lead Connection Status: 753985
Implantable Lead Implant Date: 20230802
Implantable Lead Implant Date: 20230802
Implantable Lead Implant Date: 20230802
Implantable Lead Location: 753859
Implantable Lead Location: 753860
Implantable Lead Location: 753860
Implantable Lead Model: 7122
Implantable Pulse Generator Implant Date: 20230802
Lead Channel Impedance Value: 275 Ohm
Lead Channel Impedance Value: 450 Ohm
Lead Channel Impedance Value: 462.5 Ohm
Lead Channel Pacing Threshold Amplitude: 0.5 V
Lead Channel Pacing Threshold Amplitude: 0.5 V
Lead Channel Pacing Threshold Amplitude: 0.75 V
Lead Channel Pacing Threshold Amplitude: 0.75 V
Lead Channel Pacing Threshold Amplitude: 1 V
Lead Channel Pacing Threshold Amplitude: 1 V
Lead Channel Pacing Threshold Pulse Width: 0.5 ms
Lead Channel Pacing Threshold Pulse Width: 0.5 ms
Lead Channel Pacing Threshold Pulse Width: 0.5 ms
Lead Channel Pacing Threshold Pulse Width: 0.5 ms
Lead Channel Pacing Threshold Pulse Width: 0.5 ms
Lead Channel Pacing Threshold Pulse Width: 0.5 ms
Lead Channel Sensing Intrinsic Amplitude: 11.3 mV
Lead Channel Sensing Intrinsic Amplitude: 3 mV
Lead Channel Setting Pacing Amplitude: 2 V
Lead Channel Setting Pacing Amplitude: 2.5 V
Lead Channel Setting Pacing Amplitude: 2.5 V
Lead Channel Setting Pacing Pulse Width: 0.5 ms
Lead Channel Setting Pacing Pulse Width: 0.5 ms
Lead Channel Setting Sensing Sensitivity: 0.5 mV
Pulse Gen Serial Number: 5544603

## 2022-02-02 NOTE — Telephone Encounter (Signed)
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

## 2022-02-02 NOTE — Telephone Encounter (Signed)
Discussed with Dr. Lovena Le.  Pt has follow up with Coletta Memos on February 16, 2022.  Will see if [employer] can send a rep at that time to reprogram RV lead to subtherapeutic.  Will continue to monitor.

## 2022-02-02 NOTE — Telephone Encounter (Signed)
Confirmed [employer] rep to be present at upcoming appointment for device reprogramming.

## 2022-02-03 NOTE — Telephone Encounter (Signed)
Patient is scheduled to have colonoscopy at [facility] with Dr Silverio Decamp in December.  He is aware of plan and agreed to proceed.

## 2022-02-08 NOTE — Progress Notes (Signed)
Remote ICD transmission.   

## 2022-02-09 DIAGNOSIS — M2391 Unspecified internal derangement of right knee: Secondary | ICD-10-CM | POA: Diagnosis not present

## 2022-02-10 ENCOUNTER — Other Ambulatory Visit: Payer: Self-pay | Admitting: Orthopedic Surgery

## 2022-02-10 DIAGNOSIS — M25561 Pain in right knee: Secondary | ICD-10-CM

## 2022-02-10 NOTE — Progress Notes (Unsigned)
Cardiology Clinic Note   Patient Name: [name] [name] Date of Encounter: 02/16/2022  Primary Care Provider:  [name], MD Primary Cardiologist:  Glenetta Hew, MD  Patient Profile    [name] [name] is a 76 y.o. male who presents for follow-up of his nonobstructive coronary artery disease.   Past Medical History    Past Medical History:  Diagnosis Date   Arthritis    BPH (benign prostatic hypertrophy)    CHF (congestive heart failure) ([place])    Elevated PSA    Eye problem 03/06/2016   [name] retinal detachment left eye, no sx done   History of gout    pt 05-20-2013 states stable    History of melanoma excision    2011-- LEFT UPPER BACK   Hyperlipemia    Hypertriglyceridemia    ICD (implantable cardioverter-defibrillator) battery depletion 10/27/2021   Left bundle branch block 08/2017   Concern for LBBB mediated cardiomyopathy   Melanoma ([place]) 2011   back   Nonischemic cardiomyopathy ([place]) 09/2020   08/2009: EF 40-45%, Global HK-> 09/2017 -> EF 45-50% ; 3/'22: EF<20%.  Elevated LVEDP.;  09/2020: EF 20-25%.  GR 1 DD   Numbness    hands   Personal history of arthritis    Personal history of other diseases of circulatory system    Pre-diabetes    Past Surgical History:  Procedure Laterality Date   ANTERIOR CERVICAL DECOMP/DISCECTOMY FUSION N/A 04/20/2018   Procedure: Anterior Cervical Decompression Fusion - Cervical Three-Cervical Four - Cervical Four-Cervical Five - Cervical Five-Cervical Six;  Surgeon: Eustace Moore, MD;  Location: [facility];  Service: Neurosurgery;  Laterality: N/A;  Anterior Cervical Decompression Fusion - Cervical Three-Cervical Four - Cervical Four-Cervical Five - Cervical Five-Cervical Six   APPENDECTOMY  AS CHILD   BACK SURGERY     BIV ICD INSERTION CRT-D N/A 10/27/2021   Procedure: BIV ICD INSERTION CRT-D;  Surgeon: Evans Lance, MD;  Location: [facility];  Service: Cardiovascular;  Laterality: N/A;   CARDIOVASCULAR  STRESS TEST  10/05/1998   MILD GLOBAL HYPOKINESIS AND ISCHEMIAIN ANTEROSEPTAL  AT [facility]/ EF 42%   CARPAL TUNNEL RELEASE Right    CENTRAL LINE INSERTION  10/28/2021   Procedure: CENTRAL LINE INSERTION;  Surgeon: Constance Haw, MD;  Location: [facility];  Service: Cardiovascular;;   CENTRAL LINE INSERTION  10/28/2021   Procedure: CENTRAL LINE INSERTION;  Surgeon: Sherren Mocha, MD;  Location: [facility];  Service: Cardiovascular;;   EXCISION MELANOMA LEFT UPPER BACK  10/14/2009   eye lid surgery Bilateral 2018   GOLD SEED IMPLANT N/A 07/23/2021   Procedure: GOLD SEED IMPLANT;  Surgeon: Ardis Hughs, MD;  Location: [facility];  Service: Urology;  Laterality: N/A;   KNEE ARTHROSCOPY WITH SUBCHONDROPLASTY Left 04/07/2017   Procedure: LEFT KNEE ARTHROSCOPY WITH PARTIAL MEDIAL MENISCECTOMY AND MEDIAL TIBIAL SUBCHONDROPLASTY;  Surgeon: Mcarthur Rossetti, MD;  Location: [facility];  Service: Orthopedics;  Laterality: Left;   knee injections Bilateral    LEAD REVISION/REPAIR N/A 10/28/2021   Procedure: LEAD REVISION/REPAIR;  Surgeon: Constance Haw, MD;  Location: [facility];  Service: Cardiovascular;  Laterality: N/A;   LEFT HEART CATH AND CORONARY ANGIOGRAPHY  04/2002   minimal irregularities in the LAD/  EF 50%  -   DR AL LITTLE   LUMBAR [place] SURGERY  09/12/2000   left  L5 -- S1   PERICARDIOCENTESIS N/A 10/28/2021   Procedure: PERICARDIOCENTESIS;  Surgeon:  Constance Haw, MD;  Location: [facility];  Service: Cardiovascular;  Laterality: N/A;   PERICARDIOCENTESIS N/A 10/28/2021   Procedure: PERICARDIOCENTESIS;  Surgeon: Sherren Mocha, MD;  Location: [facility];  Service: Cardiovascular;  Laterality: N/A;   PROSTATE BIOPSY N/A 05/22/2013   Procedure: BIOPSY TRANSRECTAL ULTRASONIC PROSTATE (TUBP);  Surgeon: [name], MD;  Location[facility];  Service: Urology;  Laterality: N/A;   RIGHT/LEFT HEART CATH AND CORONARY  ANGIOGRAPHY N/A 06/25/2020   Procedure: RIGHT/LEFT HEART CATH AND CORONARY ANGIOGRAPHY;  Surgeon: Belva Crome, MD;  Location: [facility];:; Normal coronary arteries; LVEDP 23 mmHg; PCWP 19 mmHg.   ROTATOR CUFF REPAIR Right 2016   SPACE OAR INSTILLATION N/A 07/23/2021   Procedure: SPACE OAR INSTILLATION;  Surgeon: Ardis Hughs, MD;  [facility];  Service: Urology;  Laterality: N/A;   THROAT SURGERY  04/20/2018   TRANSRECTAL ULTRASOUND PROSTATE BX  05-23-2005  &  04-19-2001   TRANSTHORACIC ECHOCARDIOGRAM  09/2017   EF 45-50 % (previously reported as 40 to 45%).  Incoordinate septal motion with mild LVH.  GR 1 DD.  Aortic sclerosis but no stenosis.   TRANSTHORACIC ECHOCARDIOGRAM  10/06/2020   Severely reduced EF of 20 to 25%.  No LV thrombus.  Severe septal-lateral wall systolic dyssynchrony due to LBBB.  Global HK.  Moderately dilated LV.  GR 1 DD with mild LA dilation..  Unable to assess PAP with normal RV size and function.  Normal RAP.  Mild AOV sclerosis but no stenosis.  Mild to moderate MR.   TRANSTHORACIC ECHOCARDIOGRAM  06/16/2020   Severely reduced EF <20%.  Moderate severely dilated LV.  GR 2 DD.  Elevated LVEDP.  Mild LA dilation.  Mildly reduced RV function.  Mild MR.  Mild aortic valve calcification    Allergies  Allergies  Allergen Reactions   Nitrostat [Nitroglycerin] Anaphylaxis    "Cardiologist suggested he shouldn't take this med because it could kill him with his heart condition. Lowers BP"   Ace Inhibitors Swelling   Mevacor [Lovastatin] Nausea Only   Solarcaine [Benzocaine] Dermatitis    History of Present Illness    [name] has  a PMH of nonischemic cardiomyopathy, LBBB, acute on chronic CHF with reduced ejection fraction, hypertriglyceridemia, DOE, obesity, hyperlipidemia, neck pain status post cervical spine fusion, and claudication.   He was seen in follow-up by Dr. Ellyn Hack on 05/20/2020.  At that time he reported he was feeling  better with having taken 3 days of furosemide.  His breathing was improved.  He denied lower extremity edema.  He still noted exertional dyspnea but denied chest pain and is breathing had significantly improved.  He reported that despite having symptoms he was still walking daily.  An echocardiogram was ordered.   He underwent echocardiogram 06/16/2020.  It showed severely reduced LVEF at less than 20%.  His ventricle was noted to be dilated and not relaxing well.  This is felt to be the clear reason for his shortness of breath.  No significant valvular abnormalities noted.  Cardiac catheterization was recommended.   He was seen virtually 06/19/2020 for follow-up and stated he continued to have increased dyspnea with increased exertion.  He had remained physically active doing walks and working in his shop.  We reviewed his echocardiogram and recommendations for cardiac catheterization.  He expressed understanding pertaining to risks of cardiac catheterization and agreed to receive blood products if needed.  I  ordered a CBC,  BMP, and planned follow-up after cardiac catheterization.  He was accompanied by [name] a friend who he gave permission to contact with pertinent medical information.   He underwent cardiac catheterization 06/25/2020 which showed left dominant coronary with widely patent coronary arteries, severe left ventricular systolic dysfunction and moderately elevated LVEDP consistent with chronic systolic CHF.  He was also noted to have mild pulmonary hypertension with mean PA pressure of 24 mmHg.   He presented to the clinic 07/22/2020 for follow-up evaluation stated he felt fairly well.  He presented with his friend [name].  He continued to walk around 1 mile per day or more.  He stated that he enjoyed hunting and on days when he hunts he walks more than 2 miles.  He did notice some increased shortness of breath with increased physical activity.  He brought a blood pressure log which showed  well-controlled blood pressures.  In the office today his blood pressure was 100/62.  We reviewed his cardiac catheterization.  He reported that he felt he did not receive any sedation prior to start of his cardiac catheterization and stated that he had to ask for sedation twice through the procedure.  Dr. Ellyn Hack initiated carvedilol due to his allergy to ACE inhibitors patient not felt to be a candidate for ARB/Arni.  We discussed referring to EP for evaluation of ICD.  I will have him maintain his physical activity, give him the salty 6 diet sheet, plan repeat echocardiogram in 2-1/2 months, and follow-up with Dr. Ellyn Hack in 3 months.  Echocardiogram 10/06/2020 showed LVEF 20-25% left ventricular global hypokinesis G1 DD, tricuspid regurgitation, mildly dilated left atria, mild-moderate mitral valve regurgitation.  This was a slight improvement in his LVEF.   He followed up with Dr. Lovena Le on 08/25/2020.  Continued medical management was recommended.  May consider biventricular ICD if EF does not improve.  Recommendation for adding hydralazine and nitrates, and Aldactone if blood pressure allows.   He presented to the clinic 10/27/20 for follow-up evaluation stated he feels well.  He continued to be very physically active walking 1 mile every day.  He also enjoyed splitting and stacking wood.  He had completed construction on a deer stand that was 25 feet high.  He reported that he did most of the work on his own.  He continued to follow a low-salt low sugar diet.  He reported that his friend  [name] who typically came with him contracted COVID-19 and was unable to come to the visit .  We reviewed his echocardiogram and slightly increased/improved EF.  We discussed possibility of needing ICD.  He wished to defer at the time.  I  gave him a salty 6 diet sheet, had him maintain his physical activity and planned follow-up in 6 months.  He was admitted to the hospital on 10/28/2021 and discharged on 11/02/2021.  He  reported a sudden onset of sharp chest pain after ICD implantation on 10/27/2021.  His evaluation showed a marked change in his RV ICD lead threshold.  He was admitted and underwent revision of his RV lead which showed microperforation.  During the procedure he was noted to have worsening vital signs and developed cardiac tamponade which required placement of pericardial drain.  200 mils of bloody fluid were drained.  He was started on colchicine.  His drain was removed without incident on 10/29/2021.  He was started on amiodarone.  On 10/31/2021 he developed rapid atrial fibrillation and received a bolus of amiodarone.  His subsequent  echocardiogram showed no reaccumulation of effusion.  He was prescribed a 6-week course of colchicine.  He was seen by Dr. Lovena Le on 02/01/2022.  He continued to do well post BiV ICD insertion.  He reported that he had been able to carry a gun while deer hunting and walk for around 1 mile.  He was felt to be NYHA class II.  He presents to the clinic today for follow-up evaluation and states he continues to be very physically active.  Device rep is here and has adjusted patient's RV output threshold.  She does note some spikes in fluid at the beginning of November.  On review today he denies any further episodes of chest discomfort or sharp type sensation.  He presents with his friend [name].  We reviewed the device parameters adjustments.  They expressed understanding.  He has now completed a fourth deer stand on his property.  He is well compensated from a cardiac standpoint.  I will continue his current medication regimen.  We will plan follow-up in 6 months.   Today he denies chest pain, increased shortness of breath, lower extremity edema, fatigue, palpitations, melena, hematuria, hemoptysis, diaphoresis, weakness, presyncope, syncope, orthopnea, and PND.     Home Medications    Prior to Admission medications   Medication Sig Start Date End Date Taking? Authorizing Provider   acetaminophen (TYLENOL) 500 MG tablet Take 1,000 mg by mouth every 6 (six) hours as needed for moderate pain.    [provider]  amiodarone (PACERONE) 200 MG tablet Take 1 tablet (200 mg total) by mouth daily. 11/11/21   Shirley Friar, PA-C  aspirin EC 81 MG tablet Take 1 tablet (81 mg total) by mouth daily. 04/26/18   Costella, Vista Mink, PA-C  carvedilol (COREG) 3.125 MG tablet TAKE 1 TABLET (3.125 MG TOTAL) 2 TIMES DAILY WITH A MEAL. 12/01/21   Leonie Man, MD  Cyanocobalamin (VITAMIN B-12 PO) Take 1 tablet by mouth daily.    [provider]  FINASTERIDE PO Take 5 mg by mouth daily.    [provider]  furosemide (LASIX) 40 MG tablet TAKE 1 TABLET TWICE DAILY Patient taking differently: Take 40 mg by mouth 2 (two) times a week. 01/10/22   Leonie Man, MD  gabapentin (NEURONTIN) 300 MG capsule Take 1-2 capsules (300-600 mg total) by mouth 3 (three) times daily as needed (pain). Patient taking differently: Take 600 mg by mouth 2 (two) times daily. 02/26/21   [name], MD  hydrALAZINE (APRESOLINE) 25 MG tablet TAKE 1 TABLET (25 MG TOTAL) BY MOUTH IN THE MORNING AND AT BEDTIME. 12/01/21   Leonie Man, MD  MAGNESIUM PO Take 1 tablet by mouth daily.    [provider]  Multiple Vitamin (MULTIVITAMIN WITH MINERALS) TABS tablet Take 1 tablet by mouth daily. One-A-Day Multivitamin    [provider]  Multiple Vitamins-Minerals (ZINC PO) Take 1 tablet by mouth daily.    [provider]  naproxen sodium (ALEVE) 220 MG tablet Take 440 mg by mouth daily as needed (pain).    [provider]  Omega-3 Fatty Acids (FISH OIL PO) Take 1 capsule by mouth daily.    [provider]  rosuvastatin (CRESTOR) 10 MG tablet TAKE 1 TABLET EVERY DAY 12/27/21   [name], MD  tamsulosin (FLOMAX) 0.4 MG CAPS capsule TAKE 1 CAPSULE EVERY DAY Patient taking differently: Take 0.4 mg by mouth daily. 08/03/21   [name], MD  Turmeric (QC  TUMERIC COMPLEX PO) Take 1 tablet by mouth daily.    [provider]    Family History    Family History  Problem Relation Age of Onset   Arthritis Mother    COPD Father    Lung cancer Father    Cancer Sister        type unknown   Breast cancer Sister    Lung cancer Sister    Cardiomyopathy Other        idiopathic. trivial disease 2004 cath, 2010 cardiac CT - no sig. dz, nl LV fxn. cards: Little   Colon cancer Neg Hx    He indicated that his mother is deceased. He indicated that his father is deceased. He indicated that two of his three sisters are alive. He indicated that his maternal grandmother is deceased. He indicated that his maternal grandfather is deceased. He indicated that his paternal grandmother is deceased. He indicated that his paternal grandfather is deceased. He indicated that both of his sons are alive. He indicated that the status of his neg hx is unknown. He indicated that the status of his other is unknown.  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  Tobacco Use   Smoking status: Former    Packs/day: 2.00    Years: 20.00    Total pack years: 40.00    Types: Cigarettes    Quit date: 03/28/1980    Years since quitting: 41.9   Smokeless tobacco: Never  Vaping Use   Vaping Use: Never used  Substance and Sexual Activity   Alcohol use: No    Alcohol/week: 0.0 standard drinks of alcohol   Drug use: No   Sexual activity: Not on file  Other Topics Concern   Not on file  Social History Narrative   He walks on a relatively regular basis about 15 minutes at a time mostly to the discomfort. He previously had walked up a 30-40 minutes a time without problems.  Education: [employer].   Lives alone.   Right-handed.   One cup coffee daily.   Social Determinants of Health   Financial Resource Strain: Low Risk  (11/23/2021)   Overall Financial Resource Strain (CARDIA)    Difficulty of Paying Living Expenses: Not hard at all  Food Insecurity: No Food Insecurity (11/23/2021)   Hunger Vital Sign    Worried About Running Out of Food in the Last Year: Never true    Ran Out of Food in the Last Year: Never true  Transportation Needs: No Transportation Needs (11/23/2021)   PRAPARE - Hydrologist (Medical): No    Lack of Transportation (Non-Medical): No  Physical Activity: Insufficiently Active (11/23/2021)   Exercise Vital Sign    Days of Exercise per Week: 3 days    Minutes of Exercise per Session: 30 min  Stress: No Stress Concern Present (11/23/2021)   [place]    Feeling of Stress : Not at all  Social Connections: [place] (11/23/2021)   Social Connection and Isolation Panel [NHANES]    Frequency of Communication with Friends and Family: More than three times a week  Frequency of Social Gatherings with Friends and Family: More than three times a week    Attends Religious Services: More than 4 times per year    Active Member of [employer] or Organizations: Yes    Attends Music therapist: More than 4 times per year    Marital Status: Living with partner  Intimate Partner Violence: Not At Risk (11/23/2021)   Humiliation, Afraid, Rape, and Kick questionnaire    Fear of Current or Ex-Partner: No    Emotionally Abused: No    Physically Abused: No    Sexually Abused: No     Review of Systems    General:  No chills, fever, night sweats or weight changes.  Cardiovascular:  No chest pain, dyspnea on exertion, edema, orthopnea, palpitations, paroxysmal nocturnal dyspnea. Dermatological: No rash, lesions/masses Respiratory: No cough,  dyspnea Urologic: No hematuria, dysuria Abdominal:   No nausea, vomiting, diarrhea, bright red blood per rectum, melena, or hematemesis Neurologic:  No visual changes, wkns, changes in mental status. All other systems reviewed and are otherwise negative except as noted above.  Physical Exam    VS:  BP 128/72   Pulse (!) 58   Ht 5' 7.5" (1.715 m)   Wt 184 lb (83.5 kg)   SpO2 97%   BMI 28.39 kg/m  , BMI Body mass index is 28.39 kg/m. GEN: Well nourished, well developed, in no acute distress. HEENT: normal. Neck: Supple, no JVD, carotid bruits, or masses. Cardiac: RRR, no murmurs, rubs, or gallops. No clubbing, cyanosis, edema.  Radials/DP/PT 2+ and equal bilaterally.  Respiratory:  Respirations regular and unlabored, clear to auscultation bilaterally. GI: Soft, nontender, nondistended, BS + x 4. MS: no deformity or atrophy. Skin: warm and dry, no rash. Neuro:  Strength and sensation are intact. Psych: Normal affect.  Accessory Clinical Findings    Recent Labs: 10/31/2021: Magnesium 2.1 11/01/2021: Hemoglobin 11.0; Platelets 128 11/11/2021: ALT 21; BUN 17; Creatinine, Ser 1.29; Potassium 4.8; Sodium 139; TSH 2.750   Recent Lipid Panel    Component Value Date/Time   CHOL 115 09/01/2021 1151   CHOL 213 (H) 02/27/2020 1120   TRIG 178.0 (H) 09/01/2021 1151   HDL 35.90 (L) 09/01/2021 1151   HDL 36 (L) 02/27/2020 1120   CHOLHDL 3 09/01/2021 1151   VLDL 35.6 09/01/2021 1151   LDLCALC 44 09/01/2021 1151   LDLCALC 146 (H) 02/27/2020 1120         ECG personally reviewed by me today-none today.  Echocardiogram 10/31/2021 IMPRESSIONS     1. Limited study to R/O pericardial effusion; full doppler not performed.   2. Left ventricular ejection fraction, by estimation, is 25 to 30%. The  left ventricle has severely decreased function. The left ventricle  demonstrates global hypokinesis. The left ventricular internal cavity size  was mildly dilated. There is mild left  ventricular  hypertrophy.   3. Right ventricular systolic function is normal. The right ventricular  size is normal.   4. The mitral valve is normal in structure.   5. The aortic valve is tricuspid. Aortic valve sclerosis is present, with  no evidence of aortic valve stenosis.   FINDINGS   Left Ventricle: Left ventricular ejection fraction, by estimation, is 25  to 30%. The left ventricle has severely decreased function. The left  ventricle demonstrates global hypokinesis. The left ventricular internal  cavity size was mildly dilated. There  is mild left ventricular hypertrophy.   Right Ventricle: The right ventricular size is normal. Right ventricular  systolic  function is normal.   Left Atrium: Left atrial size was normal in size.   Right Atrium: Right atrial size was normal in size.   Pericardium: Trivial pericardial effusion is present.   Mitral Valve: The mitral valve is normal in structure. Mild mitral annular  calcification.   Tricuspid Valve: The tricuspid valve is normal in structure.   Aortic Valve: The aortic valve is tricuspid. Aortic valve sclerosis is  present, with no evidence of aortic valve stenosis.   Aorta: The aortic root is normal in size and structure.   Additional Comments: Limited study to R/O pericardial effusion; full  doppler not performed. A device lead is visualized.  Kirk Ruths MD  Electronically signed by Kirk Ruths MD  Signature Date/Time: 10/31/2021/9:10:03 AM        Final      Assessment & Plan   1.  Nonischemic cardiomyopathy/ chronic CHF with reduced EF/DOE -Echocardiogram 10/31/2021 showed an LVEF of 25-30%, tricuspid aortic valve, mild LVH.  He underwent revision of his RV ICD 8/23.  NYHA class II.  Continues to be very physically active.  Device RV threshold adjusted today in clinic.  Was noted to have 2 spikes and fluid at the beginning November.  Euvolemic today.  Patient was instructed to contact EP if further episodes of irregular  heartbeat or sharp type sensation. Continue aspirin, hydralazine, furosemide, rosuvastatin, magnesium,coreg, sildenafil Heart healthy low-sodium diet Maintain physical activity   Atrial fibrillation-developed A-fib with RVR post RV ICD revision.  Denies further episodes of accelerated or irregular heartbeat. Continue amiodarone  Avoid triggers caffeine, chocolate, EtOH, dehydration etc.   Hyperlipidemia-09/01/2021: Cholesterol 115; HDL 35.90; LDL Cholesterol 44; Triglycerides 178.0; VLDL 35.6 Continue rosuvastatin, omega-3 Heart healthy low-sodium high-fiber diet Increase physical activity as tolerated    Disposition: Follow-up with Dr. Ellyn Hack in 6 months.   Jossie Ng. Wiliam Cauthorn NP-C     02/16/2022, 3:41 PM [facility] [address] Office [phone] Fax [phone]    I spent 14 minutes examining this patient, reviewing medications, and using patient centered shared decision making involving her cardiac care.  Prior to her visit I spent greater than 20 minutes reviewing her past medical history,  medications, and prior cardiac tests.

## 2022-02-16 ENCOUNTER — Ambulatory Visit: Payer: Medicare HMO | Attending: Cardiology | Admitting: General Practice

## 2022-02-16 ENCOUNTER — Ambulatory Visit
Admission: RE | Admit: 2022-02-16 | Discharge: 2022-02-16 | Disposition: A | Discharge status: Home / Self Care | Payer: Medicare HMO | Source: Ambulatory Visit | Attending: Orthopedic Surgery | Admitting: Orthopedic Surgery

## 2022-02-16 ENCOUNTER — Encounter: Payer: Self-pay | Admitting: General Practice

## 2022-02-16 VITALS — BP 128/72 | HR 58 | Ht 67.5 in | Wt 184.0 lb

## 2022-02-16 DIAGNOSIS — M1711 Unilateral primary osteoarthritis, right knee: Secondary | ICD-10-CM | POA: Diagnosis not present

## 2022-02-16 DIAGNOSIS — I4891 Unspecified atrial fibrillation: Secondary | ICD-10-CM

## 2022-02-16 DIAGNOSIS — I5022 Chronic systolic (congestive) heart failure: Secondary | ICD-10-CM | POA: Diagnosis not present

## 2022-02-16 DIAGNOSIS — E785 Hyperlipidemia, unspecified: Secondary | ICD-10-CM

## 2022-02-16 DIAGNOSIS — M7121 Synovial cyst of popliteal space [Baker], right knee: Secondary | ICD-10-CM | POA: Diagnosis not present

## 2022-02-16 DIAGNOSIS — I428 Other cardiomyopathies: Secondary | ICD-10-CM | POA: Diagnosis not present

## 2022-02-16 DIAGNOSIS — M25561 Pain in right knee: Secondary | ICD-10-CM

## 2022-02-16 NOTE — Patient Instructions (Addendum)
Medication Instructions:  The current medical regimen is effective;  continue present plan and medications as directed. Please refer to the Current Medication list given to you today.  *If you need a refill on your cardiac medications before your next appointment, please call your pharmacy*   Lab Work: NONE If you have labs (blood work) drawn today and your tests are completely normal, you will receive your results only by:  [place] (if you have MyChart) OR  A paper copy in the mail If you have any lab test that is abnormal or we need to change your treatment, we will call you to review the results.  Testing/Procedures: NONE  Follow-Up: At [facility], you and your health needs are our priority.  As part of our continuing mission to provide you with exceptional heart care, we have created designated Provider Care Teams.  These Care Teams include your primary Cardiologist (physician) and Advanced Practice Providers (APPs -  Physician Assistants and Nurse Practitioners) who all work together to provide you with the care you need, when you need it.  Your next appointment:   6 month(s)  The format for your next appointment:   In Person  Provider:   Glenetta Hew, MD  or Coletta Memos, FNP-C      Then, DR Lovena Le will plan to see you again in AS SCHEDULED .  Other Instructions MAINTAIN PHYSICAL ACTIVITY AS TOLERATED  PLEASE CALL IF YOU HAVE CHEST PAIN OR PRESSURE OR ANY OTHER SYMPTOMS   Important Information About Sugar

## 2022-02-24 NOTE — Telephone Encounter (Signed)
Inbound call from patient . Says he is returning call. Advised patient of upcoming procedure. Patient is aware. Please advise   Thank you

## 2022-02-25 NOTE — Telephone Encounter (Signed)
Returned call to the patient.  He had forgotten to delete his messages and was returning a call to me after the message I left him on 02-01-22.  Questions were answered and patient thanked me for the call.

## 2022-03-02 DIAGNOSIS — M2391 Unspecified internal derangement of right knee: Secondary | ICD-10-CM | POA: Diagnosis not present

## 2022-03-02 DIAGNOSIS — M25561 Pain in right knee: Secondary | ICD-10-CM | POA: Diagnosis not present

## 2022-03-07 ENCOUNTER — Encounter (HOSPITAL_COMMUNITY): Payer: Self-pay | Admitting: Gastroenterology

## 2022-03-07 NOTE — Progress Notes (Signed)
Attempted to obtain medical history via telephone, unable to reach at this time. HIPAA compliant voicemail message left requesting return call to pre surgical testing department. 

## 2022-03-13 NOTE — Anesthesia Preprocedure Evaluation (Addendum)
Anesthesia Evaluation  Patient identified by MRN, date of birth, ID band Patient awake    Reviewed: Allergy & Precautions, NPO status , Patient's Chart, lab work & pertinent test results  Airway Mallampati: II  TM Distance: >3 FB Neck ROM: Full    Dental no notable dental hx.    Pulmonary former smoker   Pulmonary exam normal        Cardiovascular hypertension, Pt. on medications and Pt. on home beta blockers +CHF  + dysrhythmias  Rhythm:Regular Rate:Normal  ECHO 08/23:  1. Limited study to R/O pericardial effusion; full doppler not performed.   2. Left ventricular ejection fraction, by estimation, is 25 to 30%. The  left ventricle has severely decreased function. The left ventricle  demonstrates global hypokinesis. The left ventricular internal cavity size  was mildly dilated. There is mild left  ventricular hypertrophy.   3. Right ventricular systolic function is normal. The right ventricular  size is normal.   4. The mitral valve is normal in structure.   5. The aortic valve is tricuspid. Aortic valve sclerosis is present, with  no evidence of aortic valve stenosis.     Neuro/Psych negative neurological ROS  negative psych ROS   GI/Hepatic Neg liver ROS,,,Colon polyps   Endo/Other  negative endocrine ROS    Renal/GU negative Renal ROS  negative genitourinary   Musculoskeletal  (+) Arthritis , Osteoarthritis,    Abdominal Normal abdominal exam  (+)   Peds  Hematology negative hematology ROS (+)   Anesthesia Other Findings   Reproductive/Obstetrics                             Anesthesia Physical Anesthesia Plan  ASA: 4  Anesthesia Plan: MAC   Post-op Pain Management:    Induction: Intravenous  PONV Risk Score and Plan: 1 and Propofol infusion and Treatment may vary due to age or medical condition  Airway Management Planned: Simple Face Mask, Natural Airway and Nasal  Cannula  Additional Equipment: None  Intra-op Plan:   Post-operative Plan:   Informed Consent: I have reviewed the patients History and Physical, chart, labs and discussed the procedure including the risks, benefits and alternatives for the proposed anesthesia with the patient or authorized representative who has indicated his/her understanding and acceptance.     Dental advisory given  Plan Discussed with: CRNA  Anesthesia Plan Comments:        Anesthesia Quick Evaluation

## 2022-03-14 ENCOUNTER — Ambulatory Visit (HOSPITAL_BASED_OUTPATIENT_CLINIC_OR_DEPARTMENT_OTHER): Payer: Medicare HMO | Admitting: Anesthesiology

## 2022-03-14 ENCOUNTER — Encounter (HOSPITAL_COMMUNITY): Payer: Self-pay | Admitting: Gastroenterology

## 2022-03-14 ENCOUNTER — Ambulatory Visit (HOSPITAL_COMMUNITY): Payer: Medicare HMO | Admitting: Anesthesiology

## 2022-03-14 ENCOUNTER — Encounter (HOSPITAL_COMMUNITY)
Admission: RE | Disposition: A | Discharge status: Home / Self Care | Payer: Self-pay | Source: Home / Self Care | Attending: Gastroenterology

## 2022-03-14 ENCOUNTER — Ambulatory Visit (HOSPITAL_COMMUNITY)
Admission: RE | Admit: 2022-03-14 | Discharge: 2022-03-14 | Disposition: A | Discharge status: Home / Self Care | Payer: Medicare HMO | Attending: Gastroenterology | Admitting: Gastroenterology

## 2022-03-14 ENCOUNTER — Ambulatory Visit: Payer: Medicare HMO | Admitting: Cardiology

## 2022-03-14 DIAGNOSIS — Z1211 Encounter for screening for malignant neoplasm of colon: Secondary | ICD-10-CM | POA: Diagnosis not present

## 2022-03-14 DIAGNOSIS — Z85820 Personal history of malignant melanoma of skin: Secondary | ICD-10-CM | POA: Diagnosis not present

## 2022-03-14 DIAGNOSIS — I509 Heart failure, unspecified: Secondary | ICD-10-CM

## 2022-03-14 DIAGNOSIS — K648 Other hemorrhoids: Secondary | ICD-10-CM | POA: Insufficient documentation

## 2022-03-14 DIAGNOSIS — Z87891 Personal history of nicotine dependence: Secondary | ICD-10-CM | POA: Diagnosis not present

## 2022-03-14 DIAGNOSIS — K5730 Diverticulosis of large intestine without perforation or abscess without bleeding: Secondary | ICD-10-CM | POA: Diagnosis not present

## 2022-03-14 DIAGNOSIS — I110 Hypertensive heart disease with heart failure: Secondary | ICD-10-CM

## 2022-03-14 DIAGNOSIS — K644 Residual hemorrhoidal skin tags: Secondary | ICD-10-CM | POA: Diagnosis not present

## 2022-03-14 DIAGNOSIS — Z86010 Personal history of colonic polyps: Secondary | ICD-10-CM

## 2022-03-14 DIAGNOSIS — Z09 Encounter for follow-up examination after completed treatment for conditions other than malignant neoplasm: Secondary | ICD-10-CM

## 2022-03-14 DIAGNOSIS — Z95810 Presence of automatic (implantable) cardiac defibrillator: Secondary | ICD-10-CM | POA: Insufficient documentation

## 2022-03-14 DIAGNOSIS — E781 Pure hyperglyceridemia: Secondary | ICD-10-CM | POA: Diagnosis not present

## 2022-03-14 DIAGNOSIS — M1990 Unspecified osteoarthritis, unspecified site: Secondary | ICD-10-CM | POA: Diagnosis not present

## 2022-03-14 DIAGNOSIS — Z79899 Other long term (current) drug therapy: Secondary | ICD-10-CM | POA: Diagnosis not present

## 2022-03-14 HISTORY — PX: COLONOSCOPY WITH PROPOFOL: SHX5780

## 2022-03-14 SURGERY — COLONOSCOPY WITH PROPOFOL
Anesthesia: Monitor Anesthesia Care

## 2022-03-14 MED ORDER — PROPOFOL 500 MG/50ML IV EMUL
INTRAVENOUS | Status: DC | PRN
  Administered 2022-03-14: 100 ug/kg/min via INTRAVENOUS

## 2022-03-14 MED ORDER — PROPOFOL 10 MG/ML IV BOLUS
INTRAVENOUS | Status: DC | PRN
  Administered 2022-03-14: 10 mg via INTRAVENOUS

## 2022-03-14 MED ORDER — LACTATED RINGERS IV SOLN: INTRAVENOUS | Status: DC

## 2022-03-14 MED ORDER — SODIUM CHLORIDE 0.9 % IV SOLN: INTRAVENOUS | Status: DC

## 2022-03-14 SURGICAL SUPPLY — 22 items

## 2022-03-14 NOTE — H&P (Signed)
[facility] History and Physical   Primary Care Physician:  [name], MD   Reason for Procedure:  History of adenomatous colon polyps  Plan:    Colonoscopy with possible interventions     HPI: [name] [name] is a 76 y.o. very pleasant male with history of adenomatous colon polyps here for surveillance colonoscopy. He has severe CHF with EF 25 to 30%  The risks and benefits as well as alternatives of endoscopic procedure(s) have been discussed and reviewed. All questions answered. The patient agrees to proceed.     Past Medical History:  Diagnosis Date   Arthritis    BPH (benign prostatic hypertrophy)    CHF (congestive heart failure) ([place])    Elevated PSA    Eye problem 03/06/2016   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  Elevated LVEDP.;  09/2020: EF 20-25%.  GR 1 DD   Numbness    hands   Personal history of arthritis    Personal history of other diseases of circulatory system    Pre-diabetes     Past Surgical History:  Procedure Laterality Date   ANTERIOR CERVICAL DECOMP/DISCECTOMY FUSION N/A 04/20/2018   Procedure: Anterior Cervical Decompression Fusion - Cervical Three-Cervical Four - Cervical Four-Cervical Five - Cervical Five-Cervical Six;  Surgeon: Eustace Moore, MD;  Location: [facility];  Service: Neurosurgery;  Laterality: N/A;  Anterior Cervical Decompression Fusion - Cervical Three-Cervical Four - Cervical Four-Cervical Five - Cervical Five-Cervical Six   APPENDECTOMY  AS CHILD    BACK SURGERY     BIV ICD INSERTION CRT-D N/A 10/27/2021   Procedure: BIV ICD INSERTION CRT-D;  Surgeon: Evans Lance, MD;  Location: [facility];  Service: Cardiovascular;  Laterality: N/A;   CARDIOVASCULAR STRESS TEST  10/05/1998   MILD GLOBAL HYPOKINESIS AND ISCHEMIAIN ANTEROSEPTAL  AT [facility]/ EF 42%   CARPAL TUNNEL RELEASE Right    CENTRAL LINE INSERTION  10/28/2021   Procedure: CENTRAL LINE INSERTION;  Surgeon: Constance Haw, MD;  Location: [facility];  Service: Cardiovascular;;   CENTRAL LINE INSERTION  10/28/2021   Procedure: CENTRAL LINE INSERTION;  Surgeon: Sherren Mocha, MD;  Location: [facility];  Service: Cardiovascular;;   EXCISION MELANOMA LEFT UPPER BACK  10/14/2009   eye lid surgery Bilateral 2018   GOLD SEED IMPLANT N/A 07/23/2021   Procedure: GOLD SEED IMPLANT;  Surgeon: Ardis Hughs, MD;  Location: [facility];  Service: Urology;  Laterality: N/A;   KNEE ARTHROSCOPY WITH SUBCHONDROPLASTY Left 04/07/2017   Procedure: LEFT KNEE ARTHROSCOPY WITH PARTIAL MEDIAL MENISCECTOMY AND MEDIAL TIBIAL SUBCHONDROPLASTY;  Surgeon: Mcarthur Rossetti, MD;  Location: [facility];  Service: Orthopedics;  Laterality: Left;   knee injections Bilateral    LEAD REVISION/REPAIR N/A 10/28/2021   Procedure: LEAD REVISION/REPAIR;  Surgeon: Constance Haw, MD;  Location: [facility];  Service: Cardiovascular;  Laterality: N/A;   LEFT HEART CATH AND CORONARY ANGIOGRAPHY  04/2002   minimal irregularities in the  LAD/  EF 50%  -   DR AL LITTLE   LUMBAR DISC SURGERY  09/12/2000   left  L5 -- S1   PERICARDIOCENTESIS N/A 10/28/2021   Procedure: PERICARDIOCENTESIS;  Surgeon: Constance Haw, MD;  Location: [facility];  Service: Cardiovascular;  Laterality: N/A;   PERICARDIOCENTESIS N/A 10/28/2021   Procedure: PERICARDIOCENTESIS;  Surgeon: Sherren Mocha, MD;  Location: [facility];  Service: Cardiovascular;  Laterality: N/A;   PROSTATE BIOPSY N/A  05/22/2013   Procedure: BIOPSY TRANSRECTAL ULTRASONIC PROSTATE (TUBP);  Surgeon: [name], MD;  Location[facility];  Service: Urology;  Laterality: N/A;   RIGHT/LEFT HEART CATH AND CORONARY ANGIOGRAPHY N/A 06/25/2020   Procedure: RIGHT/LEFT HEART CATH AND CORONARY ANGIOGRAPHY;  Surgeon: Belva Crome, MD;  Location: [facility];:; Normal coronary arteries; LVEDP 23 mmHg; PCWP 19 mmHg.   ROTATOR CUFF REPAIR Right 2016   SPACE OAR INSTILLATION N/A 07/23/2021   Procedure: SPACE OAR INSTILLATION;  Surgeon: Ardis Hughs, MD;  Location: [facility];  Service: Urology;  Laterality: N/A;   THROAT SURGERY  04/20/2018   TRANSRECTAL ULTRASOUND PROSTATE BX  05-23-2005  &  04-19-2001   TRANSTHORACIC ECHOCARDIOGRAM  09/2017   EF 45-50 % (previously reported as 40 to 45%).  Incoordinate septal motion with mild LVH.  GR 1 DD.  Aortic sclerosis but no stenosis.   TRANSTHORACIC ECHOCARDIOGRAM  10/06/2020   Severely reduced EF of 20 to 25%.  No LV thrombus.  Severe septal-lateral wall systolic dyssynchrony due to LBBB.  Global HK.  Moderately dilated LV.  GR 1 DD with mild LA dilation..  Unable to assess PAP with normal RV size and function.  Normal RAP.  Mild AOV sclerosis but no stenosis.  Mild to moderate MR.   TRANSTHORACIC ECHOCARDIOGRAM  06/16/2020   Severely reduced EF <20%.  Moderate severely dilated LV.  GR 2 DD.  Elevated LVEDP.  Mild LA dilation.  Mildly reduced RV function.  Mild MR.  Mild aortic valve calcification    Prior to Admission medications   Medication Sig Start Date End Date Taking? Authorizing Provider  acetaminophen (TYLENOL) 500 MG tablet Take 1,000 mg by mouth every 6 (six) hours as needed for moderate pain.   Yes [provider]  amiodarone (PACERONE) 200 MG tablet Take 1 tablet (200 mg total) by mouth daily. Patient taking differently: Take 200 mg by mouth at bedtime. 11/11/21  Yes Shirley Friar, PA-C  aspirin EC 81 MG tablet Take 1  tablet (81 mg total) by mouth daily. 04/26/18  Yes Costella, Vista Mink, PA-C  carvedilol (COREG) 3.125 MG tablet TAKE 1 TABLET (3.125 MG TOTAL) 2 TIMES DAILY WITH A MEAL. 12/01/21  Yes Leonie Man, MD  Cyanocobalamin (VITAMIN B-12 PO) Take 5,000 mcg by mouth daily.   Yes [provider]  docusate sodium (COLACE) 100 MG capsule Take 100 mg by mouth at bedtime.   Yes [provider]  finasteride (PROSCAR) 5 MG tablet Take 5 mg by mouth daily in the afternoon. 03/10/22  Yes [provider]  furosemide (LASIX) 40 MG tablet TAKE 1 TABLET TWICE DAILY Patient taking differently: Take 40 mg by mouth 2 (two) times a week. 01/10/22  Yes Leonie Man, MD  gabapentin (NEURONTIN) 300 MG capsule Take 1-2 capsules (300-600 mg total) by mouth 3 (three) times daily as needed (pain). Patient taking differently: Take 300-600 mg by mouth 2 (two) times daily. 02/26/21  Yes [name]  R, MD  hydrALAZINE (APRESOLINE) 25 MG tablet TAKE 1 TABLET (25 MG TOTAL) BY MOUTH IN THE MORNING AND AT BEDTIME. 12/01/21  Yes Leonie Man, MD  MAGNESIUM PO Take 400 mg by mouth in the morning.   Yes [provider]  Multiple Vitamin (MULTIVITAMIN WITH MINERALS) TABS tablet Take 1 tablet by mouth daily. One-A-Day Men Health Multivitamin   Yes [provider]  Multiple Vitamins-Minerals (ZINC PO) Take 50 mg by mouth in the morning.   Yes [provider]  naproxen sodium (ALEVE) 220 MG tablet Take 440 mg by mouth daily as needed (pain).   Yes [provider]  Omega-3 Fatty Acids (FISH OIL PO) Take 1,000 mg by mouth in the morning.   Yes [provider]  rosuvastatin (CRESTOR) 10 MG tablet TAKE 1 TABLET EVERY DAY Patient taking differently: Take 10 mg by mouth at bedtime. 12/27/21  Yes [name], MD  tamsulosin (FLOMAX) 0.4 MG CAPS capsule TAKE 1 CAPSULE EVERY DAY Patient taking differently: Take 0.4 mg by mouth at bedtime. 08/03/21  Yes [name], MD  Turmeric (QC TUMERIC COMPLEX PO) Take 1 tablet by mouth in the morning.   Yes [provider]    Current Facility-Administered Medications  Medication Dose Route Frequency Provider Last Rate Last Admin   0.9 %  sodium chloride infusion   Intravenous Continuous Zehr, Jessica D, PA-C       lactated ringers infusion   Intravenous Continuous Mauri Pole, MD 50 mL/hr at 03/14/22 0720 New Bag at 03/14/22 0720    Allergies as of 01/13/2022 - Review Complete 01/13/2022  Allergen Reaction Noted   Nitrostat [nitroglycerin] Anaphylaxis 07/08/2011   Ace inhibitors Swelling 07/08/2011   Mevacor [lovastatin] Nausea Only 08/15/2012   Solarcaine [benzocaine] Dermatitis 04/21/2015    Family History  Problem Relation Age of Onset   Arthritis Mother    COPD Father    Lung cancer Father    Cancer Sister        type unknown   Breast cancer Sister    Lung cancer Sister    Cardiomyopathy Other        idiopathic. trivial disease 2004 cath, 2010 cardiac CT - no sig. dz, nl LV fxn. 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  Tobacco Use   Smoking status: Former    Packs/day: 2.00    Years: 20.00    Total pack years: 40.00    Types: Cigarettes    Quit date: 03/28/1980    Years since quitting: 41.9   Smokeless tobacco: Never  Vaping Use   Vaping Use: Never used  Substance and Sexual Activity   Alcohol use: No    Alcohol/week: 0.0 standard drinks of alcohol   Drug use: No   Sexual activity: Not on file  Other Topics Concern   Not on file  Social History Narrative   He walks on a relatively regular basis about 15 minutes at a time mostly to the discomfort. He previously had  walked up a 30-40 minutes a time without problems. Education: [employer].   Lives alone.   Right-handed.  One cup coffee daily.   Social Determinants of Health   Financial Resource Strain: Low Risk  (11/23/2021)   Overall Financial Resource Strain (CARDIA)    Difficulty of Paying Living Expenses: Not hard at all  Food Insecurity: No Food Insecurity (11/23/2021)   Hunger Vital Sign    Worried About Running Out of Food in the Last Year: Never true    Ran Out of Food in the Last Year: Never true  Transportation Needs: No Transportation Needs (11/23/2021)   PRAPARE - Hydrologist (Medical): No    Lack of Transportation (Non-Medical): No  Physical Activity: Insufficiently Active (11/23/2021)   Exercise Vital Sign    Days of Exercise per Week: 3 days    Minutes of Exercise per Session: 30 min  Stress: No Stress Concern Present (11/23/2021)   [place]    Feeling of Stress : Not at all  Social Connections: [place] (11/23/2021)   Social Connection and Isolation Panel [NHANES]    Frequency of Communication with Friends and Family: More than three times a week    Frequency of Social Gatherings with Friends and Family: More than three times a week    Attends Religious Services: More than 4 times per year    Active Member of [employer] or Organizations: Yes    Attends Archivist Meetings: More than 4 times per year    Marital Status: Living with partner  Intimate Partner Violence: Not At Risk (11/23/2021)   Humiliation, Afraid, Rape, and Kick questionnaire    Fear of Current or Ex-Partner: No    Emotionally Abused: No    Physically Abused: No    Sexually Abused: No    Review of Systems:  All other review of systems negative except as mentioned in the HPI.  Physical Exam: Vital signs in last 24 hours: Temp:  [97.4 F (36.3 C)] 97.4 F (36.3 C) (12/18 0712) Pulse Rate:  [60]  60 (12/18 0712) Resp:  [10] 10 (12/18 0712) BP: (166)/(68) 166/68 (12/18 0712) SpO2:  [98 %] 98 % (12/18 0712) Weight:  [81.6 kg] 81.6 kg (12/18 2423)   General:   Alert, NAD Lungs:  Clear .   Heart:  Regular rate and rhythm Abdomen:  Soft, nontender and nondistended. Neuro/Psych:  Alert and cooperative. Normal mood and affect. A and O x 3   K. Denzil Magnuson , MD [phone]

## 2022-03-14 NOTE — Anesthesia Procedure Notes (Signed)
Procedure Name: MAC Date/Time: 03/14/2022 8:46 AM  Performed by: Niel Hummer, CRNAPre-anesthesia Checklist: Patient identified, Emergency Drugs available, Suction available and Patient being monitored Oxygen Delivery Method: Simple face mask

## 2022-03-14 NOTE — Transfer of Care (Signed)
Immediate Anesthesia Transfer of Care Note  Patient: [name] [name]  Procedure(s) Performed: Procedure(s): COLONOSCOPY WITH PROPOFOL (N/A)  Patient Location: PACU and Endoscopy Unit  Anesthesia Type:MAC  Level of Consciousness: awake, alert  and oriented  Airway & Oxygen Therapy: Patient Spontanous Breathing and Patient connected to nasal cannula oxygen  Post-op Assessment: Report given to RN and Post -op Vital signs reviewed and stable  Post vital signs: Reviewed and stable  Last Vitals:  Vitals:   03/14/22 0712  BP: (!) 166/68  Pulse: 60  Resp: 10  Temp: (!) 36.3 C  SpO2: 16%    Complications: No apparent anesthesia complications

## 2022-03-14 NOTE — Discharge Instructions (Addendum)

## 2022-03-14 NOTE — Op Note (Signed)
[facility] Patient Name: [name] [name] Procedure Date: 03/14/2022 MRN: [id number] Attending MD: Mauri Pole , MD, [id number] Date of Birth: [birth year] CSN: [id number] Age: 76 Admit Type: Outpatient Procedure:                Colonoscopy Indications:              High risk colon cancer surveillance: Personal                            history of colonic polyps, High risk colon cancer                            surveillance: Personal history of adenoma less than                            10 mm in size Providers:                Mauri Pole, MD, Mikey College, RN, Cherylynn Ridges, Technician, Eliberto Ivory Referring MD:              Medicines:                Monitored Anesthesia Care Complications:            No immediate complications. Estimated Blood Loss:     Estimated blood loss was minimal. Procedure:                Pre-Anesthesia Assessment:                           - Prior to the procedure, a History and Physical                            was performed, and patient medications and                            allergies were reviewed. The patient's tolerance of                            previous anesthesia was also reviewed. The risks                            and benefits of the procedure and the sedation                            options and risks were discussed with the patient.                            All questions were answered, and informed consent                            was obtained. Prior Anticoagulants: The patient has  taken no anticoagulant or antiplatelet agents. ASA                            Grade Assessment: III - A patient with severe                            systemic disease. After reviewing the risks and                            benefits, the patient was deemed in satisfactory                            condition to undergo the procedure.                            After obtaining informed consent, the colonoscope                            was passed under direct vision. Throughout the                            procedure, the patient's blood pressure, pulse, and                            oxygen saturations were monitored continuously. The                            [id number]-HQ190L ([id number]) Olympus colonoscope was                            introduced through the anus and advanced to the the                            cecum, identified by appendiceal orifice and                            ileocecal valve. The colonoscopy was performed                            without difficulty. The patient tolerated the                            procedure well. The quality of the bowel                            preparation was adequate to identify polyps greater                            than 5 mm in size. The ileocecal valve, appendiceal                            orifice, and rectum were photographed. Scope In: 8:54:27 AM Scope Out: 9:09:25 AM Scope Withdrawal Time: 0 hours 9 minutes 42 seconds  Total Procedure Duration:  0 hours 14 minutes 58 seconds  Findings:      The perianal and digital rectal examinations were normal.      Scattered small-mouthed diverticula were found in the sigmoid colon and       descending colon.      Non-bleeding external and internal hemorrhoids were found during       retroflexion. The hemorrhoids were medium-sized. Impression:               - Moderate diverticulosis in the sigmoid colon and                            in the descending colon.                           - Non-bleeding external and internal hemorrhoids.                           - No specimens collected. Moderate Sedation:      N/A Recommendation:           - Patient has a contact number available for                            emergencies. The signs and symptoms of potential                            delayed complications were discussed with the                             patient. Return to normal activities tomorrow.                            Written discharge instructions were provided to the                            patient.                           - Resume previous diet.                           - Continue present medications.                           - Await pathology results.                           - No repeat colonoscopy. Procedure Code(s):        --- Professional ---                           Z6109, Colorectal cancer screening; colonoscopy on                            individual at high risk Diagnosis Code(s):        --- Professional ---  K64.8, Other hemorrhoids                           Z86.010, Personal history of colonic polyps                           K57.30, Diverticulosis of large intestine without                            perforation or abscess without bleeding CPT copyright 2022 American Medical Association. All rights reserved. The codes documented in this report are preliminary and upon coder review may  be revised to meet current compliance requirements. Mauri Pole, MD 03/14/2022 9:24:00 AM This report has been signed electronically. Number of Addenda: 0

## 2022-03-14 NOTE — Anesthesia Postprocedure Evaluation (Signed)
Anesthesia Post Note  Patient: [name] [name]  Procedure(s) Performed: COLONOSCOPY WITH PROPOFOL     Patient location during evaluation: PACU Anesthesia Type: MAC Level of consciousness: awake and alert Pain management: pain level controlled Vital Signs Assessment: post-procedure vital signs reviewed and stable Respiratory status: spontaneous breathing, nonlabored ventilation, respiratory function stable and patient connected to nasal cannula oxygen Cardiovascular status: stable and blood pressure returned to baseline Postop Assessment: no apparent nausea or vomiting Anesthetic complications: no   No notable events documented.  Last Vitals:  Vitals:   03/14/22 0920 03/14/22 0930  BP: 127/64 131/66  Pulse: 65 60  Resp: 16 13  Temp:    SpO2: 100% 98%    Last Pain:  Vitals:   03/14/22 0930  TempSrc:   PainSc: 0-No pain                 Belenda Cruise P Kylar Leonhardt

## 2022-03-17 ENCOUNTER — Encounter (HOSPITAL_COMMUNITY): Payer: Self-pay | Admitting: Gastroenterology

## 2022-03-30 ENCOUNTER — Other Ambulatory Visit: Payer: Self-pay | Admitting: Family Medicine

## 2022-03-30 DIAGNOSIS — M25579 Pain in unspecified ankle and joints of unspecified foot: Secondary | ICD-10-CM

## 2022-04-01 DIAGNOSIS — D3132 Benign neoplasm of left choroid: Secondary | ICD-10-CM | POA: Diagnosis not present

## 2022-04-01 DIAGNOSIS — H47011 Ischemic optic neuropathy, right eye: Secondary | ICD-10-CM | POA: Diagnosis not present

## 2022-04-01 DIAGNOSIS — H02834 Dermatochalasis of left upper eyelid: Secondary | ICD-10-CM | POA: Diagnosis not present

## 2022-04-01 DIAGNOSIS — H43812 Vitreous degeneration, left eye: Secondary | ICD-10-CM | POA: Diagnosis not present

## 2022-04-01 DIAGNOSIS — H02831 Dermatochalasis of right upper eyelid: Secondary | ICD-10-CM | POA: Diagnosis not present

## 2022-04-01 DIAGNOSIS — H2513 Age-related nuclear cataract, bilateral: Secondary | ICD-10-CM | POA: Diagnosis not present

## 2022-04-01 DIAGNOSIS — M316 Other giant cell arteritis: Secondary | ICD-10-CM | POA: Diagnosis not present

## 2022-04-04 DIAGNOSIS — H02831 Dermatochalasis of right upper eyelid: Secondary | ICD-10-CM | POA: Diagnosis not present

## 2022-04-04 DIAGNOSIS — H2513 Age-related nuclear cataract, bilateral: Secondary | ICD-10-CM | POA: Diagnosis not present

## 2022-04-04 DIAGNOSIS — H47011 Ischemic optic neuropathy, right eye: Secondary | ICD-10-CM | POA: Diagnosis not present

## 2022-04-04 DIAGNOSIS — H43812 Vitreous degeneration, left eye: Secondary | ICD-10-CM | POA: Diagnosis not present

## 2022-04-04 DIAGNOSIS — H02834 Dermatochalasis of left upper eyelid: Secondary | ICD-10-CM | POA: Diagnosis not present

## 2022-04-04 DIAGNOSIS — D3132 Benign neoplasm of left choroid: Secondary | ICD-10-CM | POA: Diagnosis not present

## 2022-04-07 DIAGNOSIS — H47011 Ischemic optic neuropathy, right eye: Secondary | ICD-10-CM | POA: Diagnosis not present

## 2022-04-08 ENCOUNTER — Telehealth: Payer: Self-pay | Admitting: Family Medicine

## 2022-04-08 NOTE — Telephone Encounter (Signed)
Note reviewed from ophthalmology, visit on 04/01/2022.  Optic nerve swollen OD, question stroke about the nerve.  Concern regarding sildenafil use.  Also some concerns about snoring, sleep study.  Please schedule visit to discuss these symptoms and medicine further.  Let me know if there are questions.

## 2022-04-08 NOTE — Telephone Encounter (Signed)
Called patient, no answer LM for patient to call back and schedule his appt.

## 2022-04-11 NOTE — Telephone Encounter (Signed)
Pt called back and scheduled earlier

## 2022-04-13 ENCOUNTER — Encounter: Payer: Self-pay | Admitting: Family Medicine

## 2022-04-13 ENCOUNTER — Ambulatory Visit (INDEPENDENT_AMBULATORY_CARE_PROVIDER_SITE_OTHER): Payer: Medicare HMO | Admitting: Family Medicine

## 2022-04-13 VITALS — BP 130/76 | HR 68 | Temp 97.8°F | Ht 67.0 in

## 2022-04-13 DIAGNOSIS — H53121 Transient visual loss, right eye: Secondary | ICD-10-CM

## 2022-04-13 DIAGNOSIS — R0683 Snoring: Secondary | ICD-10-CM

## 2022-04-13 DIAGNOSIS — R7989 Other specified abnormal findings of blood chemistry: Secondary | ICD-10-CM | POA: Diagnosis not present

## 2022-04-13 NOTE — Progress Notes (Signed)
Subjective:  Patient ID: [name] [name], male    DOB: 04/10/1945  Age: 77 y.o. 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   Snoring: At times, not every night. Trouble sleeping at night. 3-5 hours per night. Some nightime wakening due to enlarged prostate. Some other nighttime wakening.  Takes benadryl - 2 at night - helps get to sleep. No new side effects. Overall feels well rested during the day, some sleepiness later in the day - closes eyes for a few minutes, then feels better.   Declines testing, including after possible risks of untreated sleep apnea if that were present. Sister had trouble tolerating CPAP. Does not want to go down that road at this time.   R eye vision loss Treated by ophthalmology. Concern of stroke in R eye. Mention of possible overuse of Viagra per notes from ophtho, had CT of head ordered by optho - told was normal. Vision has improved since early last week - vision better at follow up, but not back to normal, follow up 1/29 with optho. Was told kidney test up slightly at time of CT.  Has stopped viagra - max dose he took was '100mg'$ , usually '40mg'$ .   Lab Results  Component Value Date   CREATININE 1.29 (H) 11/11/2021      History Patient Active Problem List   Diagnosis Date Noted   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   Hx of colonic polyps 01/10/2022   Hypotension    Chest pain 10/28/2021   Prostate cancer ([place]) 04/02/2021   Chronic HFrEF (heart failure with reduced ejection fraction) ([place]) 05/20/2020   Claudication ([place]) 09/01/2019   S/P cervical spinal fusion 04/20/2018   Paresthesia 01/10/2018   Neck pain 01/10/2018   LBBB  (left bundle branch block) 09/07/2017   Other meniscus derangements, posterior horn of medial meniscus, left knee, insufficency fracture medial tibial plateau 04/07/2017   Closed nondisplaced fracture of lateral condyle of left tibia with routine healing 04/07/2017   Status post arthroscopy of left knee 04/07/2017   Insomnia 09/14/2015   Hyperlipidemia with target LDL less than 100 09/11/2014   Rotator cuff tendinitis 04/28/2014   Obesity (BMI 30-39.9) 12/29/2013   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   Exertional shortness of breath 06/26/2012   Polyp of colon, adenomatous 04/27/2012   Elevated PSA 10/20/2011   Hypertriglyceridemia 07/08/2011   Nonischemic cardiomyopathy ([place]) 09/04/2009   Past Medical History:  Diagnosis Date   Arthritis    BPH (benign prostatic hypertrophy)    CHF (congestive heart failure) ([place])    Elevated PSA    Eye problem 03/06/2016   [name] retinal detachment left eye, no sx done   History of gout    pt 05-20-2013 states stable    History of melanoma excision    2011-- LEFT UPPER BACK   Hyperlipemia    Hypertriglyceridemia    ICD (implantable cardioverter-defibrillator) battery depletion 10/27/2021   Left bundle branch block 08/2017   Concern for LBBB mediated cardiomyopathy   Melanoma ([place]) 2011   back   Nonischemic cardiomyopathy ([place]) 09/2020   08/2009: EF 40-45%,  Global HK-> 09/2017 -> EF 45-50% ; 3/'22: EF<20%.  Elevated LVEDP.;  09/2020: EF 20-25%.  GR 1 DD   Numbness    hands   Personal history of arthritis    Personal history of other diseases of circulatory system    Pre-diabetes    Past Surgical History:  Procedure Laterality Date   ANTERIOR CERVICAL DECOMP/DISCECTOMY FUSION N/A 04/20/2018   Procedure: Anterior Cervical Decompression Fusion - Cervical Three-Cervical Four - Cervical Four-Cervical Five - Cervical Five-Cervical Six;  Surgeon: Eustace Moore, MD;  Location: [facility];  Service: Neurosurgery;  Laterality: N/A;  Anterior  Cervical Decompression Fusion - Cervical Three-Cervical Four - Cervical Four-Cervical Five - Cervical Five-Cervical Six   APPENDECTOMY  AS CHILD   BACK SURGERY     BIV ICD INSERTION CRT-D N/A 10/27/2021   Procedure: BIV ICD INSERTION CRT-D;  Surgeon: Evans Lance, MD;  Location: [facility];  Service: Cardiovascular;  Laterality: N/A;   CARDIOVASCULAR STRESS TEST  10/05/1998   MILD GLOBAL HYPOKINESIS AND ISCHEMIAIN ANTEROSEPTAL  AT [facility]/ EF 42%   CARPAL TUNNEL RELEASE Right    CENTRAL LINE INSERTION  10/28/2021   Procedure: CENTRAL LINE INSERTION;  Surgeon: Constance Haw, MD;  Location: [facility];  Service: Cardiovascular;;   CENTRAL LINE INSERTION  10/28/2021   Procedure: CENTRAL LINE INSERTION;  Surgeon: Sherren Mocha, MD;  Location: [facility];  Service: Cardiovascular;;   COLONOSCOPY WITH PROPOFOL N/A 03/14/2022   Procedure: COLONOSCOPY WITH PROPOFOL;  Surgeon: Mauri Pole, MD;  Location: [facility];  Service: Gastroenterology;  Laterality: N/A;   EXCISION MELANOMA LEFT UPPER BACK  10/14/2009   eye lid surgery Bilateral 2018   GOLD SEED IMPLANT N/A 07/23/2021   Procedure: GOLD SEED IMPLANT;  Surgeon: Ardis Hughs, MD;  Location: [facility];  Service: Urology;  Laterality: N/A;   KNEE ARTHROSCOPY WITH SUBCHONDROPLASTY Left 04/07/2017   Procedure: LEFT KNEE ARTHROSCOPY WITH PARTIAL MEDIAL MENISCECTOMY AND MEDIAL TIBIAL SUBCHONDROPLASTY;  Surgeon: Mcarthur Rossetti, MD;  Location: [facility];  Service: Orthopedics;  Laterality: Left;   knee injections Bilateral    LEAD REVISION/REPAIR N/A 10/28/2021   Procedure: LEAD REVISION/REPAIR;  Surgeon: Constance Haw, MD;  Location: [facility];  Service: Cardiovascular;  Laterality: N/A;   LEFT HEART CATH AND CORONARY ANGIOGRAPHY  04/2002   minimal irregularities in the LAD/  EF 50%  -   DR AL LITTLE   LUMBAR [place] SURGERY  09/12/2000   left  L5 -- S1   PERICARDIOCENTESIS N/A 10/28/2021    Procedure: PERICARDIOCENTESIS;  Surgeon: Constance Haw, MD;  Location: [facility];  Service: Cardiovascular;  Laterality: N/A;   PERICARDIOCENTESIS N/A 10/28/2021   Procedure: PERICARDIOCENTESIS;  Surgeon: Sherren Mocha, MD;  Location: [facility];  Service: Cardiovascular;  Laterality: N/A;   PROSTATE BIOPSY N/A 05/22/2013   Procedure: BIOPSY TRANSRECTAL ULTRASONIC PROSTATE (TUBP);  Surgeon: [name], MD;  Location: [facility];  Service: Urology;  Laterality: N/A;   RIGHT/LEFT HEART CATH AND CORONARY ANGIOGRAPHY N/A 06/25/2020   Procedure: RIGHT/LEFT HEART CATH AND CORONARY ANGIOGRAPHY;  Surgeon: Belva Crome, MD;  Location: [facility];:; Normal coronary arteries; LVEDP 23 mmHg; PCWP 19 mmHg.   ROTATOR CUFF REPAIR Right 2016   SPACE OAR INSTILLATION N/A 07/23/2021   Procedure: SPACE OAR INSTILLATION;  Surgeon: Ardis Hughs, MD;  [facility];  Service: Urology;  Laterality: N/A;   THROAT SURGERY  04/20/2018   TRANSRECTAL ULTRASOUND PROSTATE BX  05-23-2005  &  04-19-2001   TRANSTHORACIC ECHOCARDIOGRAM  09/2017   EF 45-50 % (previously reported as 40 to 45%).  Incoordinate septal motion with mild LVH.  GR 1 DD.  Aortic sclerosis but no stenosis.   TRANSTHORACIC ECHOCARDIOGRAM  10/06/2020   Severely reduced EF of 20 to 25%.  No LV thrombus.  Severe septal-lateral wall systolic dyssynchrony due to LBBB.  Global HK.  Moderately dilated LV.  GR 1 DD with mild LA dilation..  Unable to assess PAP with normal RV size and function.  Normal RAP.  Mild AOV sclerosis but no stenosis.  Mild to moderate MR.   TRANSTHORACIC ECHOCARDIOGRAM  06/16/2020   Severely reduced EF <20%.  Moderate severely dilated LV.  GR 2 DD.  Elevated LVEDP.  Mild LA dilation.  Mildly reduced RV function.  Mild MR.  Mild aortic valve calcification   Allergies  Allergen Reactions   Nitrostat [Nitroglycerin] Anaphylaxis    "Cardiologist suggested he shouldn't take this  med because it could kill him with his heart condition. Lowers BP"   Ace Inhibitors Swelling   Mevacor [Lovastatin] Nausea Only   Solarcaine [Benzocaine] Dermatitis   Prior to Admission medications   Medication Sig Start Date End Date Taking? Authorizing Provider  acetaminophen (TYLENOL) 500 MG tablet Take 1,000 mg by mouth every 6 (six) hours as needed for moderate pain.   Yes [provider]  amiodarone (PACERONE) 200 MG tablet Take 1 tablet (200 mg total) by mouth daily. Patient taking differently: Take 200 mg by mouth at bedtime. 11/11/21  Yes Shirley Friar, PA-C  aspirin EC 81 MG tablet Take 1 tablet (81 mg total) by mouth daily. 04/26/18  Yes Costella, Vista Mink, PA-C  carvedilol (COREG) 3.125 MG tablet TAKE 1 TABLET (3.125 MG TOTAL) 2 TIMES DAILY WITH A MEAL. 12/01/21  Yes Leonie Man, MD  Cyanocobalamin (VITAMIN B-12 PO) Take 5,000 mcg by mouth daily.   Yes [provider]  docusate sodium (COLACE) 100 MG capsule Take 100 mg by mouth at bedtime.   Yes [provider]  finasteride (PROSCAR) 5 MG tablet Take 5 mg by mouth daily in the afternoon. 03/10/22  Yes [provider]  furosemide (LASIX) 40 MG tablet TAKE 1 TABLET TWICE DAILY Patient taking differently: Take 40 mg by mouth 2 (two) times a week. 01/10/22  Yes Leonie Man, MD  gabapentin (NEURONTIN) 300 MG capsule Take 1-2 capsules (300-600 mg total) by mouth 3 (three) times daily as needed (pain). Patient taking differently: Take 300-600 mg by mouth 2 (two) times daily. 02/26/21  Yes [name], MD  hydrALAZINE (APRESOLINE) 25 MG tablet TAKE 1 TABLET (25 MG TOTAL) BY MOUTH IN THE MORNING AND AT BEDTIME. 12/01/21  Yes Leonie Man, MD  MAGNESIUM PO Take 400 mg by mouth in the morning.   Yes [provider]  Multiple Vitamin (MULTIVITAMIN WITH MINERALS) TABS tablet Take 1 tablet by mouth daily. One-A-Day Men Health Multivitamin   Yes [provider]   Multiple Vitamins-Minerals (ZINC PO) Take 50 mg by mouth in the morning.   Yes [provider]  naproxen sodium (ALEVE) 220 MG tablet Take 440 mg by mouth daily as needed (pain).   Yes [provider]  Omega-3 Fatty Acids (FISH OIL PO) Take 1,000 mg by mouth in the morning.   Yes [provider]  rosuvastatin (CRESTOR) 10 MG tablet TAKE 1 TABLET EVERY DAY Patient  taking differently: Take 10 mg by mouth at bedtime. 12/27/21  Yes [name], MD  tamsulosin (FLOMAX) 0.4 MG CAPS capsule TAKE 1 CAPSULE EVERY DAY Patient taking differently: Take 0.4 mg by mouth at bedtime. 08/03/21  Yes [name], MD  Turmeric (QC TUMERIC COMPLEX PO) Take 1 tablet by mouth in the morning.   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  Tobacco Use   Smoking status: Former    Packs/day: 2.00    Years: 20.00    Total pack years: 40.00    Types: Cigarettes    Quit date: 03/28/1980    Years since quitting: 42.0   Smokeless tobacco: Never  Vaping Use   Vaping Use: Never used  Substance and Sexual Activity   Alcohol use: No    Alcohol/week: 0.0 standard drinks of alcohol   Drug use: No   Sexual activity: Not on file  Other Topics Concern   Not on file  Social History Narrative   He walks on a relatively regular basis about 15 minutes at a time mostly to the discomfort. He previously had walked up a 30-40 minutes a time without problems. Education: [employer].   Lives alone.   Right-handed.   One cup coffee daily.   Social Determinants of Health   Financial Resource Strain: Low Risk  (11/23/2021)   Overall Financial Resource Strain (CARDIA)    Difficulty of Paying Living Expenses: Not  hard at all  Food Insecurity: No Food Insecurity (11/23/2021)   Hunger Vital Sign    Worried About Running Out of Food in the Last Year: Never true    Ran Out of Food in the Last Year: Never true  Transportation Needs: No Transportation Needs (11/23/2021)   PRAPARE - Hydrologist (Medical): No    Lack of Transportation (Non-Medical): No  Physical Activity: Insufficiently Active (11/23/2021)   Exercise Vital Sign    Days of Exercise per Week: 3 days    Minutes of Exercise per Session: 30 min  Stress: No Stress Concern Present (11/23/2021)   [place]    Feeling of Stress : Not at all  Social Connections: [place] (11/23/2021)   Social Connection and Isolation Panel [NHANES]    Frequency of Communication with Friends and Family: More than three times a week    Frequency of Social Gatherings with Friends and Family: More than three times a week    Attends Religious Services: More than 4 times per year    Active Member of [employer] or Organizations: Yes    Attends Archivist Meetings: More than 4 times per year    Marital Status: Living with partner  Intimate Partner Violence: Not At Risk (11/23/2021)   Humiliation, Afraid, Rape, and Kick questionnaire    Fear of Current or Ex-Partner: No    Emotionally Abused: No    Physically Abused: No    Sexually Abused: No    Review of Systems   Objective:   Vitals:   04/13/22 1141  BP: 130/76  Pulse: 68  Temp: 97.8 F (36.6 C)  TempSrc: Temporal  SpO2:  97%  Height: '5\' 7"'$  (1.702 m)     Physical Exam Vitals reviewed.  Constitutional:      Appearance: He is well-developed.  HENT:     Head: Normocephalic and atraumatic.  Neck:     Vascular: No carotid bruit or JVD.  Cardiovascular:     Rate and Rhythm: Normal rate and regular rhythm.     Heart sounds: Normal heart sounds. No murmur heard. Pulmonary:     Effort: Pulmonary  effort is normal.     Breath sounds: Normal breath sounds. No rales.  Musculoskeletal:     Right lower leg: No edema.     Left lower leg: No edema.  Skin:    General: Skin is warm and dry.  Neurological:     General: No focal deficit present.     Mental Status: He is alert and oriented to person, place, and time.     Sensory: No sensory deficit.     Motor: No weakness.     Coordination: Coordination normal.     Gait: Gait normal.  Psychiatric:        Mood and Affect: Mood normal.       Assessment & Plan:  Nathen Balaban is a 77 y.o. male . Snoring  -With symptoms as above, discussed potential for possible underlying obstructive sleep apnea, and possible risks to health with untreated OSA.  He declines testing or further workup at this time.  Advised to let me know if or when he is ready to pursue further testing.  Episode of visual loss of right eye  -Reviewed as above and reported CT ordered by ophthalmology was reassuring.  Continue follow-up with ophthalmology  Elevated serum creatinine - Plan: Basic metabolic panel  -Reported by testing prior to CT, check updated BMP.  No orders of the defined types were placed in this encounter.  Patient Instructions    When you are able, please repeat kidney test at lab below.  Keep follow up with eye specialist.  If you would like to check testing for sleep apnea as we discussed, let me know and I will place that order.  Take care.  Return to the clinic or go to the nearest emergency room if any of your symptoms worsen or new symptoms occur.  [facility] 8:30-4:30 during weekdays, no appointment needed [address].  [place], [place] [address]     Signed,   [name], MD [facility] 04/13/22 12:26 PM

## 2022-04-13 NOTE — Patient Instructions (Signed)
   When you are able, please repeat kidney test at lab below.  Keep follow up with eye specialist.  If you would like to check testing for sleep apnea as we discussed, let me know and I will place that order.  Take care.  Return to the clinic or go to the nearest emergency room if any of your symptoms worsen or new symptoms occur.   [facility] in 8:30-4:30 during weekdays, no appointment needed [address].  [place], [place] [address]

## 2022-04-25 ENCOUNTER — Other Ambulatory Visit (INDEPENDENT_AMBULATORY_CARE_PROVIDER_SITE_OTHER): Payer: Medicare HMO

## 2022-04-25 DIAGNOSIS — H47011 Ischemic optic neuropathy, right eye: Secondary | ICD-10-CM | POA: Diagnosis not present

## 2022-04-25 DIAGNOSIS — H02831 Dermatochalasis of right upper eyelid: Secondary | ICD-10-CM | POA: Diagnosis not present

## 2022-04-25 DIAGNOSIS — R7989 Other specified abnormal findings of blood chemistry: Secondary | ICD-10-CM | POA: Diagnosis not present

## 2022-04-25 DIAGNOSIS — H2513 Age-related nuclear cataract, bilateral: Secondary | ICD-10-CM | POA: Diagnosis not present

## 2022-04-25 DIAGNOSIS — D3132 Benign neoplasm of left choroid: Secondary | ICD-10-CM | POA: Diagnosis not present

## 2022-04-25 DIAGNOSIS — H02834 Dermatochalasis of left upper eyelid: Secondary | ICD-10-CM | POA: Diagnosis not present

## 2022-04-25 DIAGNOSIS — H43812 Vitreous degeneration, left eye: Secondary | ICD-10-CM | POA: Diagnosis not present

## 2022-04-25 LAB — BASIC METABOLIC PANEL
BUN: 23 mg/dL (ref 6–23)
CO2: 28 mEq/L (ref 19–32)
Calcium: 9.4 mg/dL (ref 8.4–10.5)
Chloride: 105 mEq/L (ref 96–112)
Creatinine, Ser: 1.34 mg/dL (ref 0.40–1.50)
GFR: 50.92 mL/min — ABNORMAL LOW (ref >60.00)
Glucose, Bld: 106 mg/dL — ABNORMAL HIGH (ref 70–99)
Potassium: 4.5 mEq/L (ref 3.5–5.1)
Sodium: 140 mEq/L (ref 135–145)

## 2022-04-28 ENCOUNTER — Encounter: Payer: Self-pay | Admitting: Family Medicine

## 2022-04-28 ENCOUNTER — Ambulatory Visit (INDEPENDENT_AMBULATORY_CARE_PROVIDER_SITE_OTHER): Payer: Medicare HMO

## 2022-04-28 DIAGNOSIS — I428 Other cardiomyopathies: Secondary | ICD-10-CM | POA: Diagnosis not present

## 2022-04-28 LAB — CUP PACEART REMOTE DEVICE CHECK
Battery Remaining Longevity: 79 mo
Battery Remaining Percentage: 87 %
Battery Voltage: 3.07 V
Brady Statistic AP VP Percent: 7.9 %
Brady Statistic AP VS Percent: 1 %
Brady Statistic AS VP Percent: 91 %
Brady Statistic AS VS Percent: 1 %
Brady Statistic RA Percent Paced: 7.9 %
Date Time Interrogation Session: 20240131040015
HighPow Impedance: 72 Ohm
HighPow Impedance: 72 Ohm
Implantable Lead Connection Status: 753985
Implantable Lead Connection Status: 753985
Implantable Lead Connection Status: 753985
Implantable Lead Implant Date: 20230802
Implantable Lead Implant Date: 20230802
Implantable Lead Implant Date: 20230802
Implantable Lead Location: 753859
Implantable Lead Location: 753860
Implantable Lead Location: 753860
Implantable Lead Model: 7122
Implantable Pulse Generator Implant Date: 20230802
Lead Channel Impedance Value: 280 Ohm
Lead Channel Impedance Value: 460 Ohm
Lead Channel Impedance Value: 490 Ohm
Lead Channel Pacing Threshold Amplitude: 0.625 V
Lead Channel Pacing Threshold Amplitude: 0.75 V
Lead Channel Pacing Threshold Amplitude: 1 V
Lead Channel Pacing Threshold Pulse Width: 0.5 ms
Lead Channel Pacing Threshold Pulse Width: 0.5 ms
Lead Channel Pacing Threshold Pulse Width: 0.5 ms
Lead Channel Sensing Intrinsic Amplitude: 11.3 mV
Lead Channel Sensing Intrinsic Amplitude: 3.8 mV
Lead Channel Setting Pacing Amplitude: 0.25 V
Lead Channel Setting Pacing Amplitude: 2 V
Lead Channel Setting Pacing Amplitude: 2 V
Lead Channel Setting Pacing Pulse Width: 0.05 ms
Lead Channel Setting Pacing Pulse Width: 0.5 ms
Lead Channel Setting Sensing Sensitivity: 0.5 mV
Pulse Gen Serial Number: 5544603

## 2022-04-28 NOTE — Progress Notes (Signed)
This encounter was created in error - please disregard.

## 2022-05-12 DIAGNOSIS — L814 Other melanin hyperpigmentation: Secondary | ICD-10-CM | POA: Diagnosis not present

## 2022-05-12 DIAGNOSIS — D225 Melanocytic nevi of trunk: Secondary | ICD-10-CM | POA: Diagnosis not present

## 2022-05-12 DIAGNOSIS — Z08 Encounter for follow-up examination after completed treatment for malignant neoplasm: Secondary | ICD-10-CM | POA: Diagnosis not present

## 2022-05-12 DIAGNOSIS — Z85820 Personal history of malignant melanoma of skin: Secondary | ICD-10-CM | POA: Diagnosis not present

## 2022-05-12 DIAGNOSIS — L821 Other seborrheic keratosis: Secondary | ICD-10-CM | POA: Diagnosis not present

## 2022-05-12 DIAGNOSIS — L570 Actinic keratosis: Secondary | ICD-10-CM | POA: Diagnosis not present

## 2022-05-16 ENCOUNTER — Other Ambulatory Visit: Payer: Self-pay

## 2022-05-16 ENCOUNTER — Telehealth: Payer: Self-pay | Admitting: Family Medicine

## 2022-05-16 MED ORDER — COLCHICINE 0.6 MG PO TABS: 0.6000 mg | ORAL_TABLET | Freq: Two times a day (BID) | ORAL | 0 refills | Status: DC

## 2022-05-16 NOTE — Telephone Encounter (Signed)
Sent!

## 2022-05-16 NOTE — Telephone Encounter (Signed)
..   Encourage patient to contact the pharmacy for refills or they can request refills through [place]: 04/13/22  NEXT APPOINTMENT DATE: 09/07/22   MEDICATION:Colchicine 0.76m-   Is the patient out of medication?  Yes   PHARMACY: [facility] in [facility]  Let patient know to contact pharmacy at the end of the day to make sure medication is ready.  Please notify patient to allow 48-72 hours to process     Note: PATIENT WILL GO TO [facility] FOR BLOOD WORK AS RECOMMENDED BY DR GREEN. PATIENT WOULD LIKE LAB ORDERS TO BE RELEASED.

## 2022-05-19 NOTE — Progress Notes (Signed)
Remote ICD transmission.   

## 2022-05-25 IMAGING — CT CT CERVICAL SPINE W/O CM
3 of 4 series · 9 of 33 positions shown, 10 images · non-contrast
Comparison: None.

CLINICAL DATA: Cervicalgia

EXAM:
CT CERVICAL SPINE WITHOUT CONTRAST
TECHNIQUE: Multidetector CT imaging of the cervical spine was performed without
intravenous contrast. Multiplanar CT image reconstructions were also
generated.

[Series 5: orthogonal bone · axial · 0.21mm/px · z∈[-256,-256]mm · 1 of 110 slices shown, 2 images]
[im 55/110  soft-tissue]
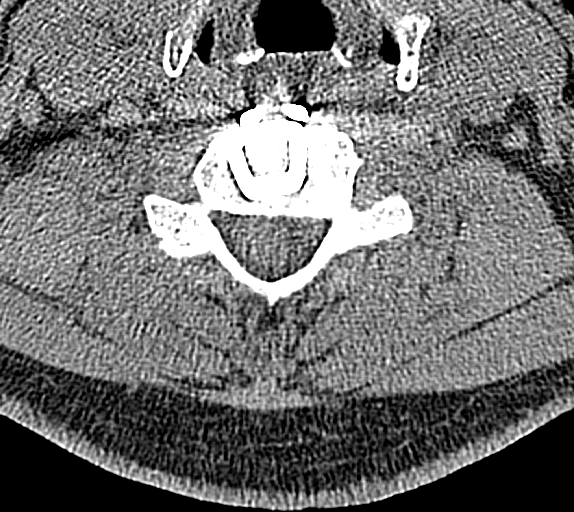
[im 55/110  bone]
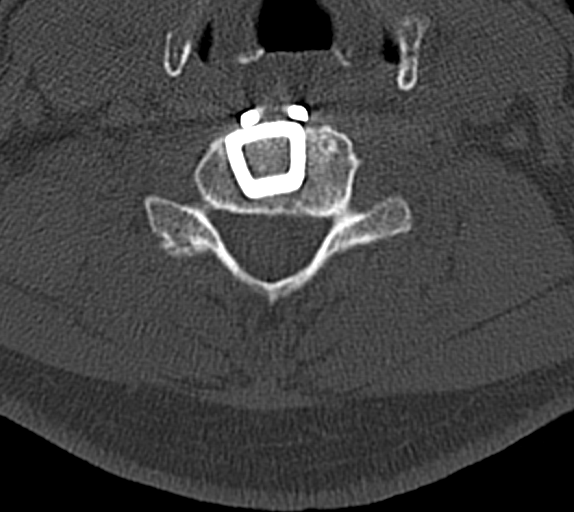

[Series 6: coronal bone · coronal · 0.26mm/px · 3 of 54 slices shown]
[im 12/54  bone]
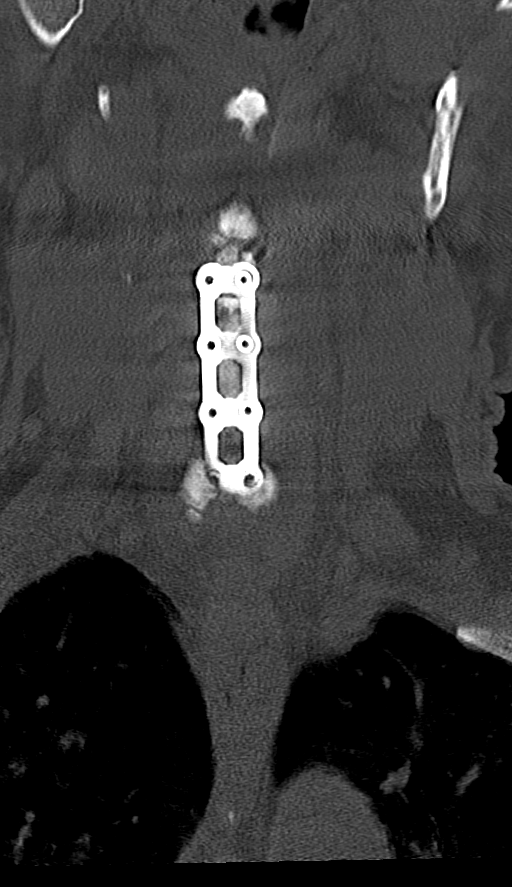
[im 22/54  bone]
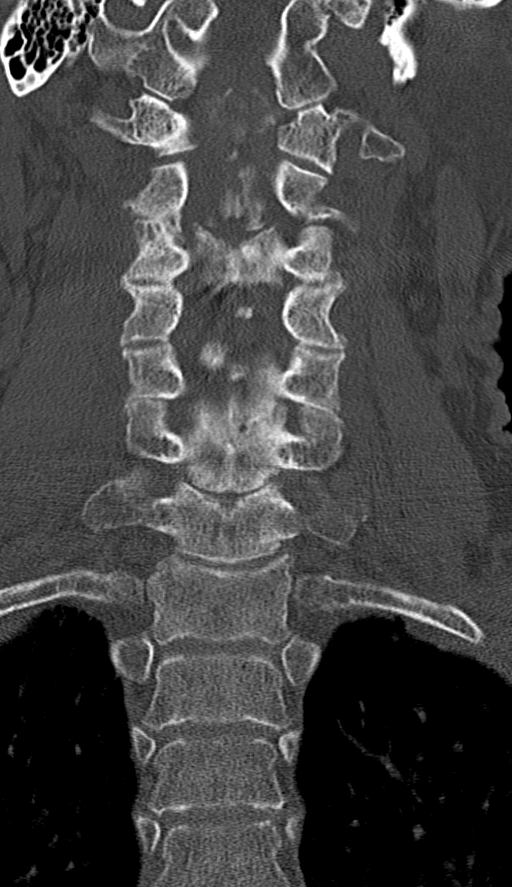
[im 32/54  bone]
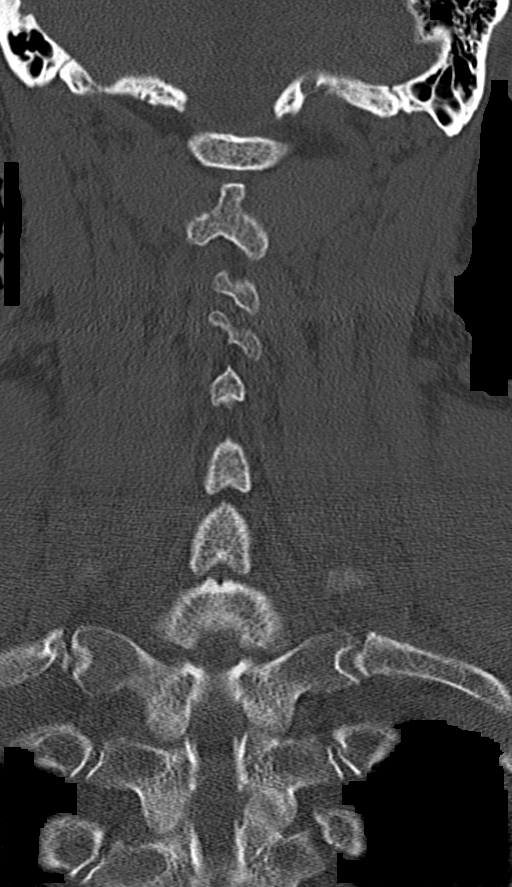

[Series 7: sagittal bone · sagittal · 0.20mm/px · 5 of 56 slices shown]
[im 19/56  bone]
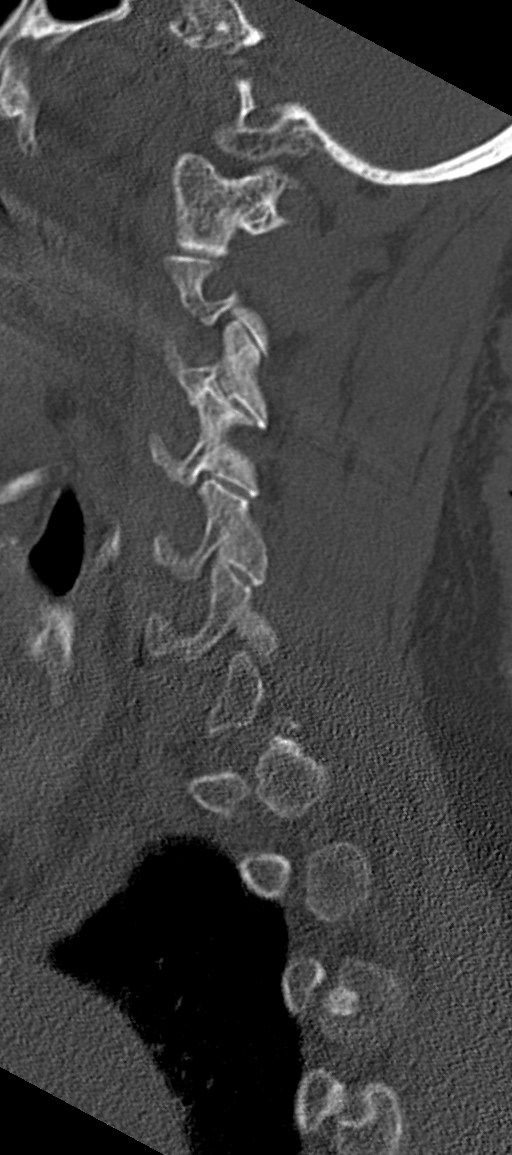
[im 23/56  bone]
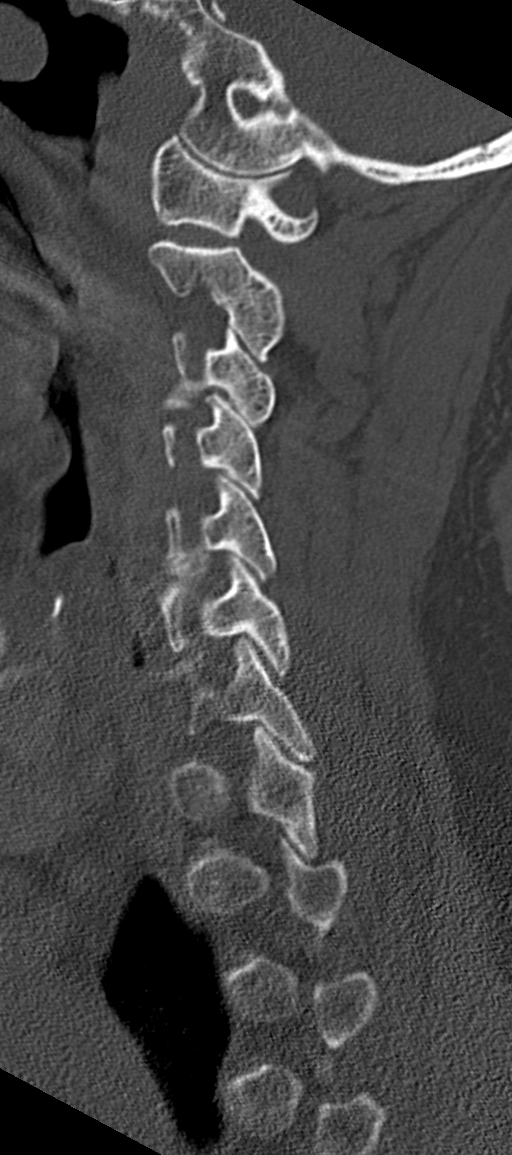
[im 28/56  bone]
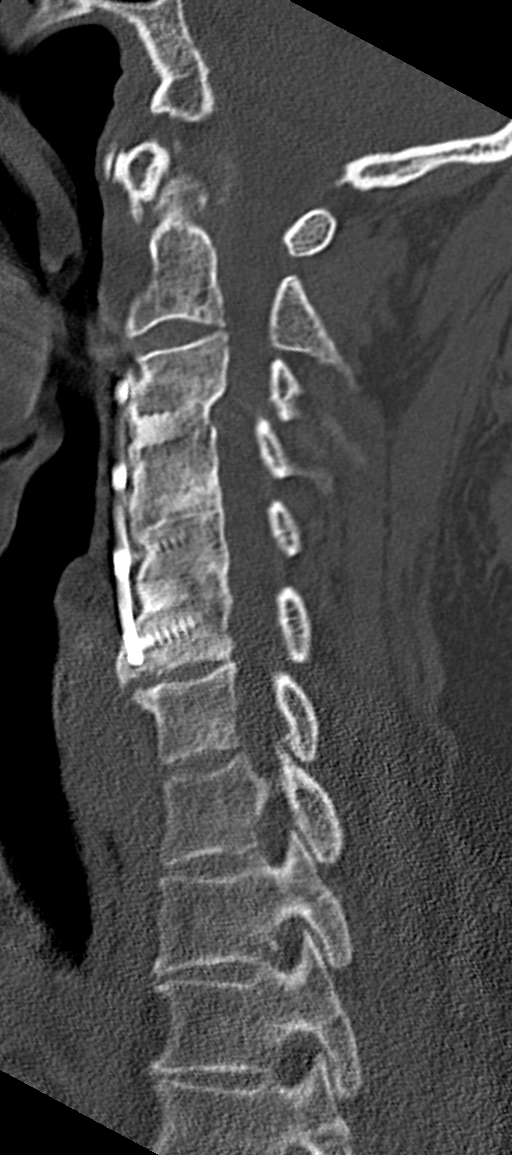
[im 33/56  bone]
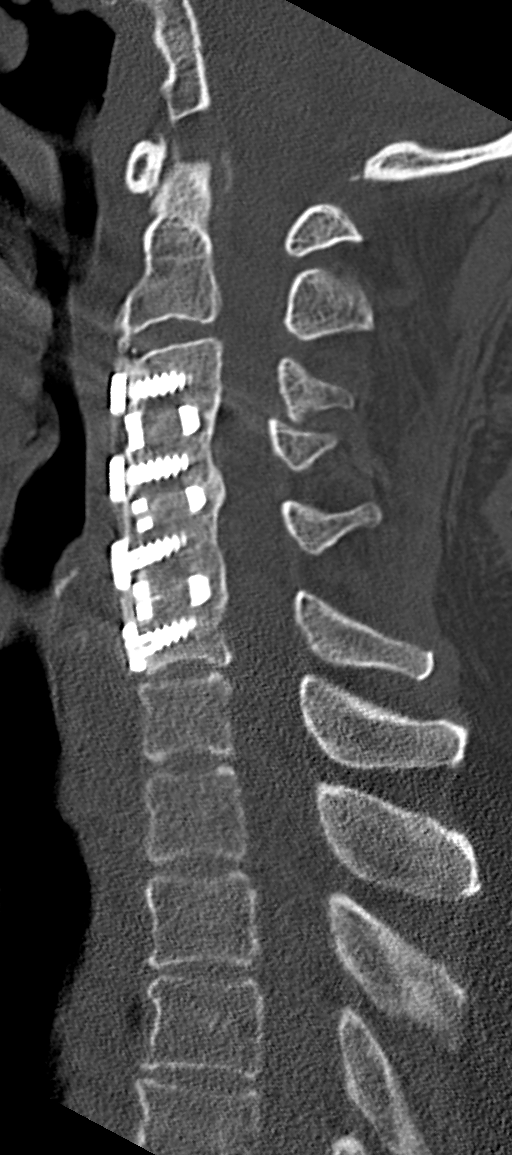
[im 37/56  bone]
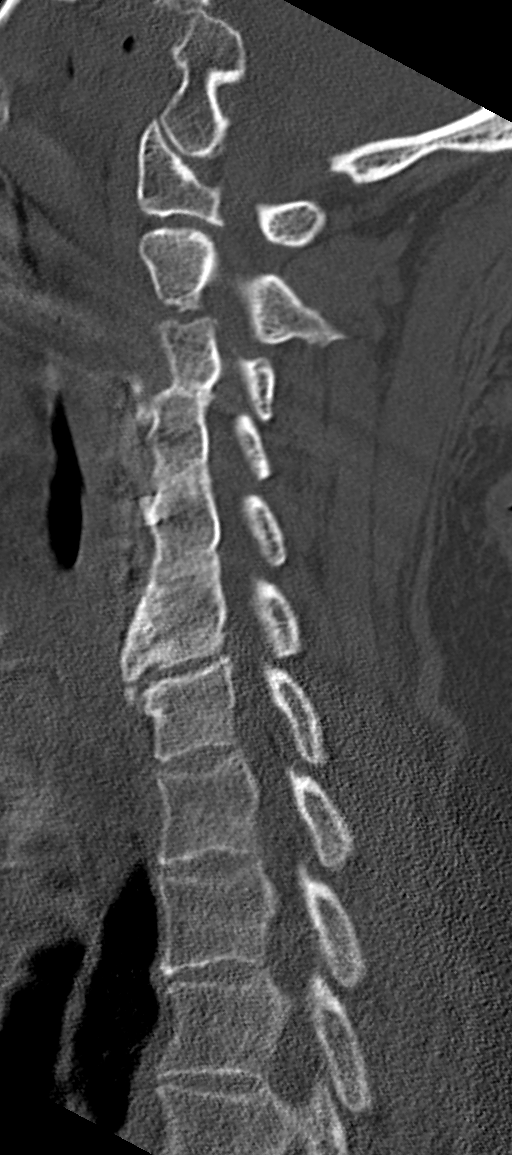

[9 of 33 positions shown; findings below may reference images not displayed]

FINDINGS: Alignment: There is no spondylolisthesis.

Skull base and vertebrae: Skull base and craniocervical junction
regions appear normal. There is pannus posterior to the odontoid
without significant impression on the craniocervical junction. There
is anterior screw and plate fixation from C3 through C6 with disc
spacers at C3-4, C4-5, and C5-6. No evident fracture. No blastic or
lytic bone lesions.

Soft tissues and spinal canal: Prevertebral soft tissues and
predental space regions are normal. No evident cord or canal
hematoma. No evident paraspinous lesions.

Disc levels: Ankylosis at C3-4, C4-5, C5-6 is present. There is
moderate disc space narrowing at C6-7 and C7 T1.

At C2-3, there is moderate facet hypertrophy bilaterally, more on
the right than the left, with exit foraminal narrowing due to bony
hypertrophy. No appreciable nerve root effacement. No disc extrusion
or stenosis.

At C3-4, there is moderate facet hypertrophy bilaterally. There is
exit foraminal narrowing bilaterally, slightly more severe on the
left than on the right without appreciable nerve root edema or
effacement. No disc extrusion or stenosis.

At C4-5, there is mild to moderate facet hypertrophy bilaterally.
There is exit foraminal narrowing on the left due to bony
hypertrophy. No appreciable nerve root edema or effacement. No disc
extrusion or stenosis.

At C5-6, there is moderate facet hypertrophy bilaterally with
impression on the exiting nerve root on the left due to bony
hypertrophy. No frank disc extrusion or stenosis. No appreciable
nerve root edema or effacement.

At C6-7, there is moderate facet hypertrophy bilaterally. No disc
extrusion or stenosis. No appreciable nerve root edema or
effacement.

At C7-T1, there is moderate facet hypertrophy bilaterally. No nerve
root edema or effacement. No disc extrusion or stenosis.

Upper chest: Visualized upper lung regions are clear.

Other: There is aortic atherosclerosis. Foci of calcification noted
in each carotid artery.
IMPRESSION: Status post anterior screw and plate fixation from C3-C6 with disc
spacers at C3-4, C4-5, and C5-6. Support hardware intact.

Multilevel osteoarthritic change. No frank disc extrusion or
stenosis. Impression on several exiting nerve roots due to bony
hypertrophy without overt nerve root edema or effacement.

No fracture or spondylolisthesis.

Aortic atherosclerosis. Foci of calcification in each carotid
artery.

Aortic Atherosclerosis ([id number]-7V3.3).

## 2022-06-03 ENCOUNTER — Telehealth: Payer: Self-pay

## 2022-06-03 NOTE — Telephone Encounter (Signed)
The patient left a voicemail stating he got shocked yesterday around 6:45 pm. The patient denies chest pains, dizziness and sob. I asked the patient to send a manual transmission for the nurse to review. I let him speak with Benjamine Mola, rn.

## 2022-06-03 NOTE — Telephone Encounter (Signed)
Spoke with patient informed him I had reviewed the transmission and that there were no episodes recorded, patient did not receive any therapy. Patient denied symptoms of passing out/dizziness. Patient appreciative of call back

## 2022-06-09 ENCOUNTER — Other Ambulatory Visit (INDEPENDENT_AMBULATORY_CARE_PROVIDER_SITE_OTHER): Payer: Medicare HMO

## 2022-06-09 ENCOUNTER — Other Ambulatory Visit: Payer: Self-pay | Admitting: Family Medicine

## 2022-06-09 DIAGNOSIS — R7989 Other specified abnormal findings of blood chemistry: Secondary | ICD-10-CM

## 2022-06-09 LAB — BASIC METABOLIC PANEL
BUN: 22 mg/dL (ref 6–23)
CO2: 26 mEq/L (ref 19–32)
Calcium: 9.4 mg/dL (ref 8.4–10.5)
Chloride: 107 mEq/L (ref 96–112)
Creatinine, Ser: 1.21 mg/dL (ref 0.40–1.50)
GFR: 57.51 mL/min — ABNORMAL LOW (ref >60.00)
Glucose, Bld: 106 mg/dL — ABNORMAL HIGH (ref 70–99)
Potassium: 4.3 mEq/L (ref 3.5–5.1)
Sodium: 139 mEq/L (ref 135–145)

## 2022-06-09 NOTE — Progress Notes (Signed)
Lab orders

## 2022-06-27 DIAGNOSIS — H2513 Age-related nuclear cataract, bilateral: Secondary | ICD-10-CM | POA: Diagnosis not present

## 2022-06-27 DIAGNOSIS — H47011 Ischemic optic neuropathy, right eye: Secondary | ICD-10-CM | POA: Diagnosis not present

## 2022-06-27 DIAGNOSIS — H02831 Dermatochalasis of right upper eyelid: Secondary | ICD-10-CM | POA: Diagnosis not present

## 2022-06-27 DIAGNOSIS — H43812 Vitreous degeneration, left eye: Secondary | ICD-10-CM | POA: Diagnosis not present

## 2022-06-27 DIAGNOSIS — H02834 Dermatochalasis of left upper eyelid: Secondary | ICD-10-CM | POA: Diagnosis not present

## 2022-06-27 DIAGNOSIS — D3132 Benign neoplasm of left choroid: Secondary | ICD-10-CM | POA: Diagnosis not present

## 2022-07-05 DIAGNOSIS — C61 Malignant neoplasm of prostate: Secondary | ICD-10-CM | POA: Diagnosis not present

## 2022-07-09 ENCOUNTER — Other Ambulatory Visit: Payer: Self-pay | Admitting: Cardiology

## 2022-07-28 ENCOUNTER — Ambulatory Visit (INDEPENDENT_AMBULATORY_CARE_PROVIDER_SITE_OTHER): Payer: Medicare HMO

## 2022-07-28 DIAGNOSIS — I428 Other cardiomyopathies: Secondary | ICD-10-CM

## 2022-07-28 LAB — CUP PACEART REMOTE DEVICE CHECK
Battery Remaining Longevity: 77 mo
Battery Remaining Percentage: 84 %
Battery Voltage: 3.04 V
Brady Statistic AP VP Percent: 4.8 %
Brady Statistic AP VS Percent: 1 %
Brady Statistic AS VP Percent: 94 %
Brady Statistic AS VS Percent: 1 %
Brady Statistic RA Percent Paced: 4.8 %
Date Time Interrogation Session: 20240502020016
HighPow Impedance: 73 Ohm
HighPow Impedance: 73 Ohm
Implantable Lead Connection Status: 753985
Implantable Lead Connection Status: 753985
Implantable Lead Connection Status: 753985
Implantable Lead Implant Date: 20230802
Implantable Lead Implant Date: 20230802
Implantable Lead Implant Date: 20230802
Implantable Lead Location: 753859
Implantable Lead Location: 753860
Implantable Lead Location: 753860
Implantable Lead Model: 7122
Implantable Pulse Generator Implant Date: 20230802
Lead Channel Impedance Value: 260 Ohm
Lead Channel Impedance Value: 400 Ohm
Lead Channel Impedance Value: 460 Ohm
Lead Channel Pacing Threshold Amplitude: 0.625 V
Lead Channel Pacing Threshold Amplitude: 0.75 V
Lead Channel Pacing Threshold Amplitude: 1 V
Lead Channel Pacing Threshold Pulse Width: 0.5 ms
Lead Channel Pacing Threshold Pulse Width: 0.5 ms
Lead Channel Pacing Threshold Pulse Width: 0.5 ms
Lead Channel Sensing Intrinsic Amplitude: 11.3 mV
Lead Channel Sensing Intrinsic Amplitude: 4.3 mV
Lead Channel Setting Pacing Amplitude: 0.25 V
Lead Channel Setting Pacing Amplitude: 2 V
Lead Channel Setting Pacing Amplitude: 2 V
Lead Channel Setting Pacing Pulse Width: 0.05 ms
Lead Channel Setting Pacing Pulse Width: 0.5 ms
Lead Channel Setting Sensing Sensitivity: 0.5 mV
Pulse Gen Serial Number: 5544603

## 2022-08-04 ENCOUNTER — Other Ambulatory Visit: Payer: Self-pay | Admitting: Family Medicine

## 2022-08-17 ENCOUNTER — Ambulatory Visit: Payer: Medicare HMO | Attending: Cardiology | Admitting: Cardiology

## 2022-08-17 VITALS — BP 108/62 | HR 78 | Ht 67.0 in | Wt 194.8 lb

## 2022-08-17 DIAGNOSIS — I4891 Unspecified atrial fibrillation: Secondary | ICD-10-CM | POA: Diagnosis not present

## 2022-08-17 DIAGNOSIS — R5383 Other fatigue: Secondary | ICD-10-CM | POA: Diagnosis not present

## 2022-08-17 DIAGNOSIS — I428 Other cardiomyopathies: Secondary | ICD-10-CM

## 2022-08-17 DIAGNOSIS — E785 Hyperlipidemia, unspecified: Secondary | ICD-10-CM | POA: Diagnosis not present

## 2022-08-17 DIAGNOSIS — Z95810 Presence of automatic (implantable) cardiac defibrillator: Secondary | ICD-10-CM

## 2022-08-17 DIAGNOSIS — R0602 Shortness of breath: Secondary | ICD-10-CM

## 2022-08-17 DIAGNOSIS — I5022 Chronic systolic (congestive) heart failure: Secondary | ICD-10-CM | POA: Diagnosis not present

## 2022-08-17 NOTE — Patient Instructions (Addendum)
Medication Instructions:  No changes      Lab Work: Not needed    Testing/Procedures:  Will be schedule at [facility] [address] Your physician has requested that you have an echocardiogram. Echocardiography is a painless test that uses sound waves to create images of your heart. It provides your doctor with information about the size and shape of your heart and how well your heart's chambers and valves are working. This procedure takes approximately one hour. There are no restrictions for this procedure. Please do NOT wear cologne, perfume, aftershave, or lotions (deodorant is allowed). Please arrive 15 minutes prior to your appointment time.   Follow-Up: At [facility], you and your health needs are our priority.  As part of our continuing mission to provide you with exceptional heart care, we have created designated Provider Care Teams.  These Care Teams include your primary Cardiologist (physician) and Advanced Practice Providers (APPs -  Physician Assistants and Nurse Practitioners) who all work together to provide you with the care you need, when you need it.     Your next appointment:   2 month(s)  The format for your next appointment:   In Person  Provider:   Bryan Lemma, MD    Other Instructions

## 2022-08-17 NOTE — Progress Notes (Signed)
Primary Care Provider: Shade Flood, MD [facility] Cardiologist: Bryan Lemma, MD Electrophysiologist: [name], MD  Clinic Note: Chief Complaint  Patient presents with   Follow-up    6 months.  More prominent exercise intolerance/fatigue then dyspnea.   Congestive Heart Failure    Denies any PND orthopnea or edema.  Just some mild exertional dyspnea but mostly notes fatigue. Had BiV ICD placed last fall complicated by lead microperforation leading to pericardial effusion treated with pericardiocentesis   ===================================  ASSESSMENT/PLAN   Problem List Items Addressed This Visit       Cardiology Problems   Nonischemic cardiomyopathy (HCC) (Chronic)    Dramatic drop in EF from 40 to 45% down to 20 to 35%.  Finally referred for BiV ICD placement.  Nominal increase initially.  Has not had an echocardiogram since that timeframe and is now having exertional fatigue.  Medication regimen limited by low blood pressures and ACE inhibitor allergy/angioedema  Plan is to recheck 2D echo and gradually titrate afterload reduction with hydralazine along with potentially adding spironolactone and SGLT2 inhibitor.      Relevant Orders   ECHOCARDIOGRAM COMPLETE   Hyperlipidemia with target LDL less than 100 (Chronic)    On statin.  Follow with PCP.      Chronic HFrEF (heart failure with reduced ejection fraction) (HCC) - Primary (Chronic)    Notably reduced EF as of last fall-finally went for BiV ICD placement.  EF improved from 20 to 25% up to 25 to 30% on follow-up echo after reevaluating pericardial effusion.  Borderline blood pressures have precluded titration medications.  ACE inhibitor allergy also limits treatment. I am concerned about his fatigue on a combination of amiodarone and carvedilol.  Plan: Reassess 2D echo to reassess EF and pericardial effusion. Discontinue amiodarone Continue carvedilol 3.125 mg twice daily along with  hydralazine 25 mg twice daily. ->  Can consider titrating further based on follow-up echocardiogram  Consider adding spironolactone at low-dose. He takes Lasix 2 times a week now.  We discussed sliding scale. With trivial requirement, would probably consider SGLT2 inhibitor.      Relevant Orders   ECHOCARDIOGRAM COMPLETE   Atrial fibrillation, transient (HCC) (Chronic)    Very transient episode of A-fib which quite likely was related to his lead perforation and tamponade with pericardiocentesis.  Very very minimal burden on pacer check last fall, but have not heard anything as of yet.  I am concerned about exertional fatigue and chronotropic competence, would therefore discontinue amiodarone.  Continue carvedilol based on CHF/nonischemic cardiomyopathy recommendations.  Was not started on DOAC, partially because of low burden and also because of concerns about microperforation.  However, with his optic nerve ischemia being concern for possible strokelike activity, I wonder if he may be should benefit from being on DOAC.  Will ask Dr. Ladona Ridgel his thoughts.      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  Would like input from Dr. Ladona Ridgel reference concerns with A-fib-plus or minus continuing amiodarone as well as plus minus considering DOAC.      Relevant Orders   ECHOCARDIOGRAM COMPLETE   Fatigue due to treatment   Exertional shortness of breath (Chronic)    Actually not really exertional dyspnea, more related to exercise intolerance.  Recheck 2D echo       ===================================  HPI:  [name] [name] is a 77 y.o. male with a PMH notable for NONISCHEMIC CARDIOMYOPATHY, LBBB, Chronic Combined CHF, obesity, HTN HLD below who presents today for 72-month follow-up at the request of Shade Flood,  MD.  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  Moderate elevated LVEDP.  Mean PAP 24 mmHg. ICD 10/27/2021  Hospitalized 8/3-10/2021-sharp chest pain after ICD implantation => RV lead revision (microperforation) developed cardiac tamponade; pericardial drain. Started on amiodarone for rapid A-fib as well as wide-complex atrial tachycardia => not started on DOAC-likely related to recent procedure. 6-week course of colchicine EF 25 to 30%, global HK by most recent Echo (had been down to 20-25%).  Trivial pericardial effusion PAF (first noted after ICD lead revision).  Now on amiodarone. HLD on rosuvastatin.  I last saw him in June 2023 and follow-up on echocardiogram showing EF down to 20 to 25% with significant septal and lateral dyssynchrony suggestive of LBBB mediated etiology.  Initially he had deferred BiV ICD placement but was becoming more dyspneic and fatigued.  Getting worn out.  I therefore referred him to Dr. Ladona Ridgel for BiV ICD/CRT P\ -> he was seen on July 7, and underwent BiV ICD placement on 10-27-21.  He was readmitted the following day with sharp chest pain and underwent RV lead revision complicated by pericardial effusion requiring pericardiocentesis.  He was on amiodarone for short spell of rapid A-fib and treated with colchicine.  Following discharge he was seen by [name] [name] from EP on November 01, 2021.  He said he was doing pretty well with no chest pain palpitations or dyspnea.  No ICD shocks.  Feeling generally well.  Plan was to continue colchicine for 6 weeks.  Paceart showed only 12 seconds of possible A-fib since discharge.  Again deferred DOAC.  Digoxin discontinued at patient request.  Continued on amiodarone load and then reduce to 200 mg daily.  [name] [name] was last seen on February 16, 2022 by Edd Fabian, NP: Had just seen Dr. Ladona Ridgel on 02/01/2022.  Doing well.  Went deer hunting and walked  about a mile or so carrying a gun.  NYHA class II.  At this visit-doing well.  No major issues.  Dr. Ladona Ridgel noted that his QRS narrowed about 30 ms.  There have been some suggestion of of spikes and fluid levels.  Otherwise he was doing relatively well.  He just completed his fourth deer stand.  Was well compensated.  Recent Hospitalizations: none recently  Reviewed  CV studies:    The following studies were reviewed today: (if available, images/films reviewed: From Epic Chart or Care Everywhere) 10/27/2021: [employer] BiV ICD insertion Echo 10/28/2021: EF 20 to 25%.  Global HK.  Small pericardial effusion anterior to the right ventricle.  There is suggestion of slightly increased pericardial pressure but no findings of cardiac tamponade.  AoV calcification but no stenosis. 10/28/2021: Lead revision/repair Periprocedure echo: EF 2025%.  Global HK.  Large pericardial effusion 10/28/2021: Pericardiocentesis-200 mL bloody fluid removed.; right IJ central line placed. Echo 10/29/2021: EF 20 to 25%, trivial pericardial effusion.  Mild LA dilation.  IVC remains dilated at estimated RAP 8 mmHg.  AoV calcification.  Echo 10/31/2021: EF 25 to 30%.  Mildly dilated LV with global HK.  Normal RV.  Aortic valve sclerosis with no stenosis.  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  For about the last month or 2 he has been just noticing more easily fatigued energy level significantly down.  More just fatigue and dyspnea.  He just feels tired doing relatively anything.  He denies any chest pain or pressure at rest exertion, and really denies any symptoms at rest, but as soon as he starts to do something he gets tired.  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  He did denies any PND, orthopnea or edema.  He only notes exertional dyspnea truthfully when he is going up steps.  But he really just notices the fatigue and exercise intolerance.  Denies any palpitations or irregular heartbeats.  No bleeding issues.  Initially he thought it was the jet digoxin that was making him feel bad but he feels like he was back on it.  He mentioned something also about possibly having to undergo radiation therapy for prostate cancer.  CV Review of Symptoms (Summary): positive for - dyspnea on exertion and more exercise intolerance/fatigue.  Only exertional dyspnea going up stairs or incline.  But this is a progression from last visit. negative for - chest pain, edema, irregular heartbeat, orthopnea, palpitations, paroxysmal nocturnal dyspnea, rapid heart rate, shortness of breath, or syncope or near-syncope, TIA/amaurosis fugax or claudication.  Melena, hematochezia, hematuria or epistaxis.  REVIEWED OF SYSTEMS   Review of Systems  Constitutional:  Positive for malaise/fatigue.  HENT:  Negative for congestion and nosebleeds.   Eyes:        Right eye vision loss  Respiratory:  Negative for shortness of breath.   Cardiovascular:  Negative for claudication and leg swelling.  Gastrointestinal:  Negative for blood in stool and melena.  Genitourinary:  Negative for hematuria.  Musculoskeletal:  Positive for joint pain. Negative for myalgias.  Neurological:  Negative for dizziness, speech change and focal weakness.  Psychiatric/Behavioral:  Positive for memory loss. Negative for depression. The patient is not nervous/anxious and does not have insomnia.    I have reviewed and (if needed) personally updated the patient's problem list, medications, allergies, past medical and surgical history, social and family history.   PAST MEDICAL HISTORY   Past Medical History:  Diagnosis Date   Arthritis    BPH (benign prostatic hypertrophy)    CHF (congestive heart  failure) (HCC)    Elevated PSA    Eye problem 03/06/2016   Dr. Jerilynn Birkenhead retinal detachment left eye, no sx done   History of gout    pt 05-20-2013 states stable    History of melanoma excision    2011-- LEFT UPPER BACK   Hyperlipemia    Hypertriglyceridemia    ICD (implantable cardioverter-defibrillator) battery depletion 10/27/2021   Left bundle branch block 08/2017   Concern for LBBB mediated cardiomyopathy   Melanoma (HCC) 2011   back   Nonischemic cardiomyopathy (HCC) 09/2020   08/2009: EF 40-45%, Global HK-> 09/2017 -> EF 45-50% ; 3/'22: EF<20%.  Elevated LVEDP.;  09/2020: EF 20-25%.  GR 1 DD   Numbness    hands   Personal history of arthritis    Personal history of other diseases of circulatory system    Pre-diabetes     PAST SURGICAL HISTORY   Past Surgical History:  Procedure Laterality Date   ANTERIOR CERVICAL DECOMP/DISCECTOMY FUSION N/A 04/20/2018   Procedure: Anterior Cervical Decompression  Fusion - Cervical Three-Cervical Four - Cervical Four-Cervical Five - Cervical Five-Cervical Six;  Surgeon: Tia Alert, MD;  Location: [facility] OR;  Service: Neurosurgery;  Laterality: N/A;  Anterior Cervical Decompression Fusion - Cervical Three-Cervical Four - Cervical Four-Cervical Five - Cervical Five-Cervical Six   APPENDECTOMY  AS CHILD   BACK SURGERY     BIV ICD INSERTION CRT-D N/A 10/27/2021   Procedure: BIV ICD INSERTION CRT-D;  Surgeon: Marinus Maw, MD;  Location: [facility] INVASIVE CV LAB;  Service: Cardiovascular;  Laterality: N/A;   CARDIOVASCULAR STRESS TEST  10/05/1998   MILD GLOBAL HYPOKINESIS AND ISCHEMIAIN ANTEROSEPTAL  AT [facility]PEX/ EF 42%   CARPAL TUNNEL RELEASE Right    CENTRAL LINE INSERTION  10/28/2021   Procedure: CENTRAL LINE INSERTION;  Surgeon: Regan Lemming, MD;  Location: [facility] INVASIVE CV LAB;  Service: Cardiovascular;;   CENTRAL LINE INSERTION  10/28/2021   Procedure: CENTRAL LINE INSERTION;  Surgeon: Tonny Bollman, MD;  Location: [facility] INVASIVE CV LAB;  Service:  Cardiovascular;;   COLONOSCOPY WITH PROPOFOL N/A 03/14/2022   Procedure: COLONOSCOPY WITH PROPOFOL;  Surgeon: Napoleon Form, MD;  Location: [facility] ENDOSCOPY;  Service: Gastroenterology;  Laterality: N/A;   EXCISION MELANOMA LEFT UPPER BACK  10/14/2009   eye lid surgery Bilateral 2018   GOLD SEED IMPLANT N/A 07/23/2021   Procedure: GOLD SEED IMPLANT;  Surgeon: Crist Fat, MD;  Location: [facility] ORS;  Service: Urology;  Laterality: N/A;   KNEE ARTHROSCOPY WITH SUBCHONDROPLASTY Left 04/07/2017   Procedure: LEFT KNEE ARTHROSCOPY WITH PARTIAL MEDIAL MENISCECTOMY AND MEDIAL TIBIAL SUBCHONDROPLASTY;  Surgeon: Kathryne Hitch, MD;  Location: [facility] ORS;  Service: Orthopedics;  Laterality: Left;   knee injections Bilateral    LEAD REVISION/REPAIR N/A 10/28/2021   Procedure: LEAD REVISION/REPAIR;  Surgeon: Regan Lemming, MD;  Location: [facility] INVASIVE CV LAB;  Service: Cardiovascular;  Laterality: N/A;   LEFT HEART CATH AND CORONARY ANGIOGRAPHY  04/2002   minimal irregularities in the LAD/  EF 50%  -   DR AL LITTLE   LUMBAR DISC SURGERY  09/12/2000   left  L5 -- S1   PERICARDIOCENTESIS N/A 10/28/2021   Procedure: PERICARDIOCENTESIS;  Surgeon: Regan Lemming, MD;  Location: [facility] INVASIVE CV LAB;  Service: Cardiovascular;  Laterality: N/A;   PERICARDIOCENTESIS N/A 10/28/2021   Procedure: PERICARDIOCENTESIS;  Surgeon: Tonny Bollman, MD;  Location: [facility] INVASIVE CV LAB;  Service: Cardiovascular;  Laterality: N/A;   PROSTATE BIOPSY N/A 05/22/2013   Procedure: BIOPSY TRANSRECTAL ULTRASONIC PROSTATE (TUBP);  Surgeon: Valetta Fuller, MD;  Location: [facility];  Service: Urology;  Laterality: N/A;   RIGHT/LEFT HEART CATH AND CORONARY ANGIOGRAPHY N/A 06/25/2020   Procedure: RIGHT/LEFT HEART CATH AND CORONARY ANGIOGRAPHY;  Surgeon: Lyn Records, MD;  Location: [facility] INVASIVE CV LAB;:; Normal coronary arteries; LVEDP 23 mmHg; PCWP 19 mmHg.   ROTATOR CUFF REPAIR Right 2016   SPACE OAR  INSTILLATION N/A 07/23/2021   Procedure: SPACE OAR INSTILLATION;  Surgeon: Crist Fat, MD;  Location: [facility] ORS;  Service: Urology;  Laterality: N/A;   THROAT SURGERY  04/20/2018   TRANSRECTAL ULTRASOUND PROSTATE BX  05-23-2005  &  04-19-2001   TRANSTHORACIC ECHOCARDIOGRAM  09/2017   EF 45-50 % (previously reported as 40 to 45%).  Incoordinate septal motion with mild LVH.  GR 1 DD.  Aortic sclerosis but no stenosis.   TRANSTHORACIC ECHOCARDIOGRAM  10/06/2020   Severely reduced EF of 20 to 25%.  No LV thrombus.  Severe septal-lateral wall systolic  dyssynchrony due to LBBB.  Global HK.  Moderately dilated LV.  GR 1 DD with mild LA dilation..  Unable to assess PAP with normal RV size and function.  Normal RAP.  Mild AOV sclerosis but no stenosis.  Mild to moderate MR.   TRANSTHORACIC ECHOCARDIOGRAM  06/16/2020   Severely reduced EF <20%.  Moderate severely dilated LV.  GR 2 DD.  Elevated LVEDP.  Mild LA dilation.  Mildly reduced RV function.  Mild MR.  Mild aortic valve calcification    MEDICATIONS/ALLERGIES   Current Meds  Medication Sig   acetaminophen (TYLENOL) 500 MG tablet Take 1,000 mg by mouth every 6 (six) hours as needed for moderate pain.   amiodarone (PACERONE) 200 MG tablet Take 1 tablet (200 mg total) by mouth daily.h at bedtime.)   aspirin EC 81 MG tablet Take 1 tablet (81 mg total) by mouth daily.   carvedilol (COREG) 3.125 MG tablet TAKE 1 TABLET (3.125 MG TOTAL) 2 TIMES DAILY WITH A MEAL.   colchicine 0.6 MG tablet Take 1 tablet (0.6 mg total) by mouth 2 (two) times daily.   Cyanocobalamin (VITAMIN B-12 PO) Take 5,000 mcg by mouth daily.   docusate sodium (COLACE) 100 MG capsule Take 100 mg by mouth at bedtime.   finasteride (PROSCAR) 5 MG tablet Take 5 mg by mouth daily in the afternoon.   furosemide (LASIX) 40 MG tablet TAKE 1 TABLET TWICE DAILY (Patient taking differently: Take 40 mg by mouth 2 (two) times a week.)   gabapentin (NEURONTIN) 300 MG capsule Take 1-2  capsules (300-600 mg total) by mouth 3 (three) times daily as needed (pain). (Patient taking differently: Take 300-600 mg by mouth 2 (two) times daily.)   hydrALAZINE (APRESOLINE) 25 MG tablet TAKE 1 TABLET (25 MG TOTAL) BY MOUTH IN THE MORNING AND AT BEDTIME.   MAGNESIUM PO Take 400 mg by mouth in the morning.   Multiple Vitamin (MULTIVITAMIN WITH MINERALS) TABS tablet Take 1 tablet by mouth daily. One-A-Day Men Health Multivitamin   Multiple Vitamins-Minerals (ZINC PO) Take 50 mg by mouth in the morning.   naproxen sodium (ALEVE) 220 MG tablet Take 440 mg by mouth daily as needed (pain).   Omega-3 Fatty Acids (FISH OIL PO) Take 1,000 mg by mouth in the morning.   rosuvastatin (CRESTOR) 10 MG tablet TAKE 1 TABLET EVERY DAY (Patient taking differently: Take 10 mg by mouth at bedtime.)   tamsulosin (FLOMAX) 0.4 MG CAPS capsule TAKE 1 CAPSULE EVERY DAY   Turmeric (QC TUMERIC COMPLEX PO) Take 1 tablet by mouth in the morning.   Current Facility-Administered Medications for the 08/17/22 encounter (Office Visit) with [name], MD  Medication   sodium chloride flush (NS) 0.9 % injection 3 mL    Allergies  Allergen Reactions   Nitrostat [Nitroglycerin] Anaphylaxis    "Cardiologist suggested he shouldn't take this med because it could kill him with his heart condition. Lowers BP"   Ace Inhibitors Swelling   Mevacor [Lovastatin] Nausea Only   Solarcaine [Benzocaine] Dermatitis    SOCIAL HISTORY/FAMILY HISTORY   Reviewed in Epic:  Pertinent findings:  Social History   Tobacco Use   Smoking status: Former    Packs/day: 2.00    Years: 20.00    Additional pack years: 0.00    Total pack years: 40.00    Types: Cigarettes    Quit date: 03/28/1980    Years since quitting: 42.4   Smokeless tobacco: Never  Vaping Use   Vaping Use: Never  used  Substance Use Topics   Alcohol use: No    Alcohol/week: 0.0 standard drinks of alcohol   Drug use: No   Social History   Social History  Narrative   He walks on a relatively regular basis about 15 minutes at a time mostly to the discomfort. He previously had walked up a 30-40 minutes a time without problems. Education: [employer].   Lives alone.   Right-handed.   One cup coffee daily.    OBJCTIVE -PE, EKG, labs   Wt Readings from Last 3 Encounters:  08/17/22 194 lb 12.8 oz (88.4 kg)  03/14/22 180 lb (81.6 kg)  02/16/22 184 lb (83.5 kg)    Physical Exam: BP 108/62   Pulse 78   Ht 5\' 7"  (1.702 m)   Wt 194 lb 12.8 oz (88.4 kg)   SpO2 96%   BMI 30.51 kg/m  Physical Exam Vitals reviewed.  Constitutional:      General: He is not in acute distress.    Appearance: Normal appearance. He is obese. He is not ill-appearing or toxic-appearing.     Comments: Relatively healthy appearing.  HENT:     Head: Normocephalic and atraumatic.  Neck:     Vascular: No carotid bruit, hepatojugular reflux or JVD.  Cardiovascular:     Rate and Rhythm: Normal rate and regular rhythm. No extrasystoles are present.    Chest Wall: PMI is not displaced.     Pulses: Normal pulses.     Heart sounds: S1 normal and S2 normal. Murmur (Soft 1/6 SEM at RUSB.) heard.     No friction rub. Gallop present. S4 sounds present.  Pulmonary:     Effort: Pulmonary effort is normal. No respiratory distress.     Breath sounds: Normal breath sounds. No wheezing, rhonchi or rales.  Chest:     Chest wall: No tenderness.  Musculoskeletal:        General: Swelling (Trivial) present. Normal range of motion.     Cervical back: Normal range of motion and neck supple.  Skin:    General: Skin is warm and dry.     Coloration: Skin is not jaundiced or pale.  Neurological:     General: No focal deficit present.     Mental Status: He is alert and oriented to person, place, and time.     Gait: Gait abnormal.  Psychiatric:        Mood and Affect: Mood normal.        Behavior: Behavior normal.        Thought Content: Thought content normal.        Judgment:  Judgment normal.     Adult ECG Report N/a  Recent Labs:  reviewed  Lab Results  Component Value Date   CHOL 115 09/01/2021   HDL 35.90 (L) 09/01/2021   LDLCALC 44 09/01/2021   TRIG 178.0 (H) 09/01/2021   CHOLHDL 3 09/01/2021   Lab Results  Component Value Date   CREATININE 1.21 06/09/2022   BUN 22 06/09/2022   NA 139 06/09/2022   K 4.3 06/09/2022   CL 107 06/09/2022   CO2 26 06/09/2022      Latest Ref Rng & Units 11/01/2021    4:49 AM 10/31/2021    3:19 AM 10/30/2021    5:24 AM  CBC  WBC 4.0 - 10.5 K/uL 6.9  8.0  11.7   Hemoglobin 13.0 - 17.0 g/dL 81.1  9.8  91.4   Hematocrit 39.0 - 52.0 % 32.0  29.4  30.1   Platelets 150 - 400 K/uL 128  95  94     Lab Results  Component Value Date   HGBA1C 6.2 (H) 07/12/2021   Lab Results  Component Value Date   TSH 2.750 11/11/2021    ================================================== I spent a total of 36 minutes with the patient spent in direct patient consultation.  Additional time spent with chart review  / charting (studies, outside notes, etc): 28 min Total Time: 64 min  Current medicines are reviewed at length with the patient today.  (+/- concerns) N/A  Notice: This dictation was prepared with Dragon dictation along with smart phrase technology. Any transcriptional errors that result from this process are unintentional and may not be corrected upon review.  Studies Ordered:   Orders Placed This Encounter  Procedures   ECHOCARDIOGRAM COMPLETE   No orders of the defined types were placed in this encounter.   Patient Instructions / Medication Changes & Studies & Tests Ordered   Patient Instructions  Medication Instructions:  No changes      Lab Work: Not needed    Testing/Procedures:  Will be schedule at [facility] [address] Your physician has requested that you have an echocardiogram. Echocardiography is a painless test that uses sound waves to create images of your heart. It provides  your doctor with information about the size and shape of your heart and how well your heart's chambers and valves are working. This procedure takes approximately one hour. There are no restrictions for this procedure. Please do NOT wear cologne, perfume, aftershave, or lotions (deodorant is allowed). Please arrive 15 minutes prior to your appointment time.   Follow-Up: At [facility], you and your health needs are our priority.  As part of our continuing mission to provide you with exceptional heart care, we have created designated Provider Care Teams.  These Care Teams include your primary Cardiologist (physician) and Advanced Practice Providers (APPs -  Physician Assistants and Nurse Practitioners) who all work together to provide you with the care you need, when you need it.     Your next appointment:   2 month(s)  The format for your next appointment:   In Person  Provider:   Bryan Lemma, MD    Other Instructions      [name], MD, MS Bryan Lemma, M.D., M.S. Interventional Cardiologist  [facility]  Pager # [phone] Phone # [phone] [address] [place], [place] [address]   Thank you for choosing [facility] [place]!!

## 2022-08-17 NOTE — Progress Notes (Signed)
Remote ICD transmission.   

## 2022-08-22 ENCOUNTER — Encounter: Payer: Self-pay | Admitting: Cardiology

## 2022-08-22 ENCOUNTER — Encounter: Payer: Self-pay | Admitting: Family Medicine

## 2022-08-22 NOTE — Assessment & Plan Note (Signed)
Very transient episode of A-fib which quite likely was related to his lead perforation and tamponade with pericardiocentesis.  Very very minimal burden on pacer check last fall, but have not heard anything as of yet.  I am concerned about exertional fatigue and chronotropic competence, would therefore discontinue amiodarone.  Continue carvedilol based on CHF/nonischemic cardiomyopathy recommendations.  Was not started on DOAC, partially because of low burden and also because of concerns about microperforation.  However, with his optic nerve ischemia being concern for possible strokelike activity, I wonder if he may be should benefit from being on DOAC.  Will ask Dr. Ladona Ridgel his thoughts.

## 2022-08-22 NOTE — Assessment & Plan Note (Signed)
Notably reduced EF as of last fall-finally went for BiV ICD placement.  EF improved from 20 to 25% up to 25 to 30% on follow-up echo after reevaluating pericardial effusion.  Borderline blood pressures have precluded titration medications.  ACE inhibitor allergy also limits treatment. I am concerned about his fatigue on a combination of amiodarone and carvedilol.  Plan: Reassess 2D echo to reassess EF and pericardial effusion. Discontinue amiodarone Continue carvedilol 3.125 mg twice daily along with hydralazine 25 mg twice daily. ->  Can consider titrating further based on follow-up echocardiogram  Consider adding spironolactone at low-dose. He takes Lasix 2 times a week now.  We discussed sliding scale. With trivial requirement, would probably consider SGLT2 inhibitor.

## 2022-08-22 NOTE — Assessment & Plan Note (Signed)
Dramatic drop in EF from 40 to 45% down to 20 to 35%.  Finally referred for BiV ICD placement.  Nominal increase initially.  Has not had an echocardiogram since that timeframe and is now having exertional fatigue.  Medication regimen limited by low blood pressures and ACE inhibitor allergy/angioedema  Plan is to recheck 2D echo and gradually titrate afterload reduction with hydralazine along with potentially adding spironolactone and SGLT2 inhibitor.

## 2022-08-22 NOTE — Assessment & Plan Note (Signed)
Continue to monitor volume status and evaluate for recurrence of A-fib.  Would like input from Dr. Ladona Ridgel reference concerns with A-fib-plus or minus continuing amiodarone as well as plus minus considering DOAC.

## 2022-08-22 NOTE — Assessment & Plan Note (Signed)
On statin.  Follow with PCP.

## 2022-08-22 NOTE — Assessment & Plan Note (Signed)
Actually not really exertional dyspnea, more related to exercise intolerance.  Recheck 2D echo

## 2022-08-23 ENCOUNTER — Emergency Department (HOSPITAL_COMMUNITY): Payer: Medicare HMO

## 2022-08-23 ENCOUNTER — Encounter (HOSPITAL_COMMUNITY): Payer: Self-pay

## 2022-08-23 ENCOUNTER — Other Ambulatory Visit: Payer: Self-pay

## 2022-08-23 ENCOUNTER — Emergency Department (HOSPITAL_COMMUNITY)
Admission: EM | Admit: 2022-08-23 | Discharge: 2022-08-23 | Disposition: A | Discharge status: Home / Self Care | Payer: Medicare HMO | Attending: Emergency Medicine | Admitting: Emergency Medicine

## 2022-08-23 ENCOUNTER — Ambulatory Visit
Admission: EM | Admit: 2022-08-23 | Discharge: 2022-08-23 | Disposition: A | Discharge status: Home / Self Care | Payer: Medicare HMO

## 2022-08-23 DIAGNOSIS — E871 Hypo-osmolality and hyponatremia: Secondary | ICD-10-CM | POA: Diagnosis not present

## 2022-08-23 DIAGNOSIS — Z7982 Long term (current) use of aspirin: Secondary | ICD-10-CM | POA: Insufficient documentation

## 2022-08-23 DIAGNOSIS — M791 Myalgia, unspecified site: Secondary | ICD-10-CM | POA: Insufficient documentation

## 2022-08-23 DIAGNOSIS — D649 Anemia, unspecified: Secondary | ICD-10-CM | POA: Insufficient documentation

## 2022-08-23 DIAGNOSIS — R519 Headache, unspecified: Secondary | ICD-10-CM | POA: Diagnosis not present

## 2022-08-23 DIAGNOSIS — I110 Hypertensive heart disease with heart failure: Secondary | ICD-10-CM | POA: Insufficient documentation

## 2022-08-23 DIAGNOSIS — R509 Fever, unspecified: Secondary | ICD-10-CM | POA: Insufficient documentation

## 2022-08-23 DIAGNOSIS — G4700 Insomnia, unspecified: Secondary | ICD-10-CM | POA: Diagnosis not present

## 2022-08-23 DIAGNOSIS — I213 ST elevation (STEMI) myocardial infarction of unspecified site: Secondary | ICD-10-CM | POA: Diagnosis not present

## 2022-08-23 DIAGNOSIS — I509 Heart failure, unspecified: Secondary | ICD-10-CM | POA: Diagnosis not present

## 2022-08-23 DIAGNOSIS — Z1152 Encounter for screening for COVID-19: Secondary | ICD-10-CM | POA: Diagnosis not present

## 2022-08-23 DIAGNOSIS — Z79899 Other long term (current) drug therapy: Secondary | ICD-10-CM | POA: Diagnosis not present

## 2022-08-23 DIAGNOSIS — R52 Pain, unspecified: Secondary | ICD-10-CM

## 2022-08-23 DIAGNOSIS — Z950 Presence of cardiac pacemaker: Secondary | ICD-10-CM | POA: Insufficient documentation

## 2022-08-23 DIAGNOSIS — R0600 Dyspnea, unspecified: Secondary | ICD-10-CM | POA: Diagnosis not present

## 2022-08-23 DIAGNOSIS — Z8673 Personal history of transient ischemic attack (TIA), and cerebral infarction without residual deficits: Secondary | ICD-10-CM | POA: Diagnosis not present

## 2022-08-23 DIAGNOSIS — G4489 Other headache syndrome: Secondary | ICD-10-CM | POA: Diagnosis not present

## 2022-08-23 DIAGNOSIS — R42 Dizziness and giddiness: Secondary | ICD-10-CM

## 2022-08-23 DIAGNOSIS — R9431 Abnormal electrocardiogram [ECG] [EKG]: Secondary | ICD-10-CM | POA: Diagnosis not present

## 2022-08-23 LAB — URINALYSIS, W/ REFLEX TO CULTURE (INFECTION SUSPECTED)
Bilirubin Urine: NEGATIVE
Glucose, UA: NEGATIVE mg/dL
Ketones, ur: NEGATIVE mg/dL
Leukocytes,Ua: NEGATIVE
Nitrite: NEGATIVE
Protein, ur: 30 mg/dL — AB
Specific Gravity, Urine: 1.024 (ref 1.005–1.030)
pH: 5 (ref 5.0–8.0)

## 2022-08-23 LAB — CBC
HCT: 38.8 % — ABNORMAL LOW (ref 39.0–52.0)
Hemoglobin: 12.5 g/dL — ABNORMAL LOW (ref 13.0–17.0)
MCH: 29.6 pg (ref 26.0–34.0)
MCHC: 32.2 g/dL (ref 30.0–36.0)
MCV: 91.9 fL (ref 80.0–100.0)
Platelets: 102 10*3/uL — ABNORMAL LOW (ref 150–400)
RBC: 4.22 MIL/uL (ref 4.22–5.81)
RDW: 12.6 % (ref 11.5–15.5)
WBC: 5.9 10*3/uL (ref 4.0–10.5)
nRBC: 0 % (ref 0.0–0.2)

## 2022-08-23 LAB — TROPONIN I (HIGH SENSITIVITY)
Troponin I (High Sensitivity): 15 ng/L (ref <18)
Troponin I (High Sensitivity): 16 ng/L (ref <18)

## 2022-08-23 LAB — BASIC METABOLIC PANEL
Anion gap: 12 (ref 5–15)
BUN: 15 mg/dL (ref 8–23)
CO2: 23 mmol/L (ref 22–32)
Calcium: 8.7 mg/dL — ABNORMAL LOW (ref 8.9–10.3)
Chloride: 97 mmol/L — ABNORMAL LOW (ref 98–111)
Creatinine, Ser: 1.29 mg/dL — ABNORMAL HIGH (ref 0.61–1.24)
GFR, Estimated: 57 mL/min — ABNORMAL LOW (ref >60)
Glucose, Bld: 117 mg/dL — ABNORMAL HIGH (ref 70–99)
Potassium: 4.2 mmol/L (ref 3.5–5.1)
Sodium: 132 mmol/L — ABNORMAL LOW (ref 135–145)

## 2022-08-23 LAB — SARS CORONAVIRUS 2 BY RT PCR: SARS Coronavirus 2 by RT PCR: NEGATIVE

## 2022-08-23 MED ORDER — ACETAMINOPHEN 500 MG PO TABS
1000.0000 mg | ORAL_TABLET | Freq: Once | ORAL | Status: AC
  Administered 2022-08-23: 1000 mg via ORAL
  Filled 2022-08-23: qty 2

## 2022-08-23 NOTE — Discharge Instructions (Addendum)
Patient taken to ER via EMS 

## 2022-08-23 NOTE — ED Provider Notes (Incomplete)
[place] EMERGENCY DEPARTMENT AT [facility] Provider Note   CSN: [id number] Arrival date & time: 08/23/22  1510     History {Add pertinent medical, surgical, social history, OB history to HPI:1} No chief complaint on file.   [name] [name] is a 77 y.o. 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  Patient has taken Tylenol with minimal improvement in symptoms.  He has had subjective fevers at home, was unable to check due to broken thermometer.  He states he is not currently feeling dizzy, only when he stands up and tries to move around.  He has had increased dyspnea with exertion.  He reports he feels pain "in every fiber of his body".  Patient does spend a lot of time outside in the woods and has had multiple tick bites recently, but no rashes.  He reports he is often bit by ticks since he spends so much time outdoors, so this is not abnormal for him.  Denies facial asymmetry, light-headedness, speech difficulty, syncope, weakness.       Home Medications Prior to Admission medications   Medication Sig Start Date End Date Taking? Authorizing Provider  acetaminophen (TYLENOL) 500 MG tablet Take 1,000 mg by mouth every 6 (six) hours as needed for moderate pain.    [provider]  aspirin EC 81 MG tablet Take 1 tablet (81 mg total) by mouth daily. 04/26/18   Costella, Darci Current, PA-C  carvedilol (COREG) 3.125 MG tablet TAKE 1 TABLET (3.125 MG TOTAL) 2 TIMES DAILY WITH A MEAL. 12/01/21   [name], MD  colchicine 0.6 MG tablet Take 1 tablet (0.6 mg total) by mouth 2 (two) times daily. 05/16/22 08/17/22  Shade Flood, MD  Cyanocobalamin (VITAMIN B-12 PO) Take 5,000 mcg by mouth daily.    [provider]  docusate sodium (COLACE) 100 MG capsule Take 100 mg by  mouth at bedtime.    [provider]  finasteride (PROSCAR) 5 MG tablet Take 5 mg by mouth daily in the afternoon. 03/10/22   [provider]  furosemide (LASIX) 40 MG tablet TAKE 1 TABLET TWICE DAILY Patient taking differently: Take 40 mg by mouth 2 (two) times a week. 01/10/22   [name], MD  gabapentin (NEURONTIN) 300 MG capsule Take 1-2 capsules (300-600 mg total) by mouth 3 (three) times daily as needed (pain). Patient taking differently: Take 300-600 mg by mouth 2 (two) times daily. 02/26/21   Shade Flood, MD  hydrALAZINE (APRESOLINE) 25 MG tablet TAKE 1 TABLET (25 MG TOTAL) BY MOUTH IN THE MORNING AND AT BEDTIME. 07/11/22   [name], MD  MAGNESIUM PO Take 400 mg by mouth in the morning.    [provider]  Multiple Vitamin (MULTIVITAMIN WITH MINERALS) TABS tablet Take 1 tablet by mouth daily. One-A-Day Men Health Multivitamin    [provider]  Multiple Vitamins-Minerals (ZINC PO) Take 50 mg by mouth in the morning.    [provider]  naproxen sodium (ALEVE) 220 MG tablet Take 440 mg by mouth daily as needed (pain).    [provider]  Omega-3 Fatty Acids (FISH OIL PO) Take 1,000 mg by mouth in the morning.    [provider]  rosuvastatin (CRESTOR) 10 MG tablet TAKE 1 TABLET EVERY DAY Patient taking differently: Take 10  mg by mouth at bedtime. 12/27/21   Shade Flood, MD  tamsulosin (FLOMAX) 0.4 MG CAPS capsule TAKE 1 CAPSULE EVERY DAY 08/04/22   Shade Flood, MD  Turmeric (QC TUMERIC COMPLEX PO) Take 1 tablet by mouth in the morning.    [provider]      Allergies    Nitrostat [nitroglycerin], Ace inhibitors, Mevacor [lovastatin], and Solarcaine [benzocaine]    Review of Systems   Review of Systems  Constitutional:  Positive for appetite change (decreased) and fever.  Musculoskeletal:  Positive for arthralgias and myalgias.  Neurological:  Positive for dizziness and headaches.  Negative for syncope, facial asymmetry, speech difficulty, weakness and light-headedness.    Physical Exam Updated Vital Signs BP (!) 121/49   Pulse 89   Temp 98.2 F (36.8 C) (Oral)   Resp 19   SpO2 98%  Physical Exam Vitals and nursing note reviewed.  Constitutional:      General: He is not in acute distress.    Appearance: Normal appearance. He is not ill-appearing or diaphoretic.  HENT:     Head: Normocephalic and atraumatic.  Eyes:     General: Lids are normal. Vision grossly intact.     Extraocular Movements: Extraocular movements intact.     Conjunctiva/sclera: Conjunctivae normal.     Pupils: Pupils are equal, round, and reactive to light.  Cardiovascular:     Rate and Rhythm: Normal rate and regular rhythm.  Pulmonary:     Effort: Pulmonary effort is normal.  Abdominal:     General: Abdomen is flat.     Palpations: Abdomen is soft.     Tenderness: There is no abdominal tenderness.  Musculoskeletal:     Cervical back: Full passive range of motion without pain.  Skin:    General: Skin is warm and dry.     Capillary Refill: Capillary refill takes less than 2 seconds.     Findings: No lesion or rash.  Neurological:     Mental Status: He is alert and oriented to person, place, and time. Mental status is at baseline.     GCS: GCS eye subscore is 4. GCS verbal subscore is 5. GCS motor subscore is 6.     Cranial Nerves: Cranial nerves 2-12 are intact.     Sensory: Sensation is intact.     Motor: Motor function is intact.     Coordination: Coordination is intact.     Comments: Patient is moving all extremities appropriately.  5/5 strength in bilateral upper and lower extremities.  Gross sensation intact.  CN 2-12 grossly intact.  Speech is clear, no slurring.  Gait not assessed.    Psychiatric:        Mood and Affect: Mood normal.        Behavior: Behavior normal.    ED Results / Procedures / Treatments   Labs (all labs ordered are listed, but only abnormal results  are displayed) Labs Reviewed  BASIC METABOLIC PANEL - Abnormal; Notable for the following components:      Result Value   Sodium 132 (*)    Chloride 97 (*)    Glucose, Bld 117 (*)    Creatinine, Ser 1.29 (*)    Calcium 8.7 (*)    GFR, Estimated 57 (*)    All other components within normal limits  CBC - Abnormal; Notable for the following components:   Hemoglobin 12.5 (*)    HCT 38.8 (*)    Platelets 102 (*)    All  other components within normal limits  SARS CORONAVIRUS 2 BY RT PCR  URINALYSIS, W/ REFLEX TO CULTURE (INFECTION SUSPECTED)  TROPONIN I (HIGH SENSITIVITY)  TROPONIN I (HIGH SENSITIVITY)    EKG EKG Interpretation  Date/Time:  Tuesday Aug 23 2022 15:10:32 EDT Ventricular Rate:  95 PR Interval:  174 QRS Duration: 136 QT Interval:  376 QTC Calculation: 472 R Axis:   -28 Text Interpretation: Atrial-sensed ventricular-paced rhythm Abnormal ECG When compared with ECG of 29-Oct-2021 23:42, PREVIOUS ECG IS PRESENT Confirmed by Vonita Moss [phone]) on 08/23/2022 5:43:27 PM  Radiology DG Chest Portable 1 View  Result Date: 08/23/2022 CLINICAL DATA:  Dyspnea on exertion EXAM: PORTABLE CHEST 1 VIEW COMPARISON:  10/29/2021 FINDINGS: Stable appearance of the pacer. Lower cervical plate and screw fixator. Heart size currently within normal limits. The lungs appear clear. No blunting of the costophrenic angles. IMPRESSION: 1. No active cardiopulmonary disease is radiographically apparent. 2. Stable appearance of the pacer. Electronically Signed   By: Gaylyn Rong M.D.   On: 08/23/2022 18:10   CT Head Wo Contrast  Result Date: 08/23/2022 CLINICAL DATA:  Headache, increasing frequency or severity. EXAM: CT HEAD WITHOUT CONTRAST TECHNIQUE: Contiguous axial images were obtained from the base of the skull through the vertex without intravenous contrast. RADIATION DOSE REDUCTION: This exam was performed according to the departmental dose-optimization program which includes  automated exposure control, adjustment of the mA and/or kV according to patient size and/or use of iterative reconstruction technique. COMPARISON:  Head CT 03/05/2016 FINDINGS: Brain: There is no evidence of an acute infarct, intracranial hemorrhage, mass, midline shift, or extra-axial fluid collection. The ventricles and sulci are within normal limits for age. Vascular: No hyperdense vessel. Skull: No acute fracture or suspicious osseous lesion. Sinuses/Orbits: No significant inflammatory changes in the included paranasal sinuses or mastoid air cells. Unremarkable orbits. Other: None. IMPRESSION: Negative head CT. Electronically Signed   By: Sebastian Ache M.D.   On: 08/23/2022 16:33    Procedures Procedures  {Document cardiac monitor, telemetry assessment procedure when appropriate:1}  Medications Ordered in ED Medications - No data to display  ED Course/ Medical Decision Making/ A&P   {   Click here for ABCD2, HEART and other calculatorsREFRESH Note before signing :1}                          Medical Decision Making Amount and/or Complexity of Data Reviewed Labs: ordered. Radiology: ordered.  Risk OTC drugs.   This patient presents to the ED with chief complaint(s) of headache, dizziness, body aches, insomnia with pertinent past medical history of CHF, HTN, ICD/pacemaker.  The complaint involves an extensive differential diagnosis and also carries with it a high risk of complications and morbidity.    The differential diagnosis includes COVID, viral etiology, infectious process, acute UTI, tick-borne illness (Lyme disease, [place] Spotted Fever), metabolic derangement, CVA, intracranial hemorrhage, intracranial lesion, hydrocephalus, bad headache    The initial plan is to obtain CT head, UA, baseline labs, COVID swab  Additional history obtained: Additional history obtained from  friend Babette Relic is in the room with patient.  She reports she had to pull ticks off of the patient's  body, with at least one being on the patient's back.  She is concerned for tick borne illness, but did not see rash on the patient at home.   Initial Assessment:   On exam, patient is resting comfortably in bed and does not appear to be in acute  distress.    Independent ECG/labs interpretation:  The following labs were independently interpreted:  ***  Independent visualization and interpretation of imaging: I independently visualized the following imaging with scope of interpretation limited to determining acute life threatening conditions related to emergency care: CT head, which revealed no acute intracranial abnormality.  Chest x-ray without   Treatment and Reassessment: Patient was given 1000 mg of Tylenol with improvement in body aches and headache.    Other treatment options considered:   ***  Disposition:   ***  Social Determinants of Health:   Patient's {YNWG:95621}  increases the complexity of managing their presentation   {Document critical care time when appropriate:1} {Document review of labs and clinical decision tools ie heart score, Chads2Vasc2 etc:1}  {Document your independent review of radiology images, and any outside records:1} {Document your discussion with family members, caretakers, and with consultants:1} {Document social determinants of health affecting pt's care:1} {Document your decision making why or why not admission, treatments were needed:1} Final Clinical Impression(s) / ED Diagnoses Final diagnoses:  None    Rx / DC Orders ED Discharge Orders     None

## 2022-08-23 NOTE — ED Notes (Signed)
Patient is being discharged from the Urgent Care and sent to the Emergency Department via EMS . Per Cheri Rous NP, patient is in need of higher level of care due to loss of balance, weakness, headache. Patient is aware and verbalizes understanding of plan of care.  Vitals:   08/23/22 1351 08/23/22 1352  BP:  111/63  Pulse: 97   Temp:  99 F (37.2 C)  SpO2: 93%

## 2022-08-23 NOTE — ED Provider Notes (Signed)
[place] EMERGENCY DEPARTMENT AT [facility] Provider Note   CSN: [id number] Arrival date & time: 08/23/22  1510     History {Add pertinent medical, surgical, social history, OB history to HPI:1} No chief complaint on file.   [name] [name] is a 77 y.o. 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  Patient has taken Tylenol with minimal improvement in symptoms.  He has had subjective fevers at home, was unable to check due to broken thermometer.  He states he is not currently feeling dizzy, only when he stands up and tries to move around.  He has had increased dyspnea with exertion.  He reports he feels pain "in every fiber of his body".  Patient does spend a lot of time outside in the woods and has had multiple tick bites recently, but no rashes.  He reports he is often bit by ticks since he spends so much time outdoors, so this is not abnormal for him.  Denies facial asymmetry, light-headedness, speech difficulty, syncope, weakness.       Home Medications Prior to Admission medications   Medication Sig Start Date End Date Taking? Authorizing Provider  acetaminophen (TYLENOL) 500 MG tablet Take 1,000 mg by mouth every 6 (six) hours as needed for moderate pain.    [provider]  aspirin EC 81 MG tablet Take 1 tablet (81 mg total) by mouth daily. 04/26/18   Costella, Darci Current, PA-C  carvedilol (COREG) 3.125 MG tablet TAKE 1 TABLET (3.125 MG TOTAL) 2 TIMES DAILY WITH A MEAL. 12/01/21   [name], MD  colchicine 0.6 MG tablet Take 1 tablet (0.6 mg total) by mouth 2 (two) times daily. 05/16/22 08/17/22  Shade Flood, MD  Cyanocobalamin (VITAMIN B-12 PO) Take 5,000 mcg by mouth daily.    [provider]  docusate sodium (COLACE) 100 MG capsule Take 100 mg by  mouth at bedtime.    [provider]  finasteride (PROSCAR) 5 MG tablet Take 5 mg by mouth daily in the afternoon. 03/10/22   [provider]  furosemide (LASIX) 40 MG tablet TAKE 1 TABLET TWICE DAILY Patient taking differently: Take 40 mg by mouth 2 (two) times a week. 01/10/22   [name], MD  gabapentin (NEURONTIN) 300 MG capsule Take 1-2 capsules (300-600 mg total) by mouth 3 (three) times daily as needed (pain). Patient taking differently: Take 300-600 mg by mouth 2 (two) times daily. 02/26/21   Shade Flood, MD  hydrALAZINE (APRESOLINE) 25 MG tablet TAKE 1 TABLET (25 MG TOTAL) BY MOUTH IN THE MORNING AND AT BEDTIME. 07/11/22   [name], MD  MAGNESIUM PO Take 400 mg by mouth in the morning.    [provider]  Multiple Vitamin (MULTIVITAMIN WITH MINERALS) TABS tablet Take 1 tablet by mouth daily. One-A-Day Men Health Multivitamin    [provider]  Multiple Vitamins-Minerals (ZINC PO) Take 50 mg by mouth in the morning.    [provider]  naproxen sodium (ALEVE) 220 MG tablet Take 440 mg by mouth daily as needed (pain).    [provider]  Omega-3 Fatty Acids (FISH OIL PO) Take 1,000 mg by mouth in the morning.    [provider]  rosuvastatin (CRESTOR) 10 MG tablet TAKE 1 TABLET EVERY DAY Patient taking differently: Take 10  mg by mouth at bedtime. 12/27/21   Shade Flood, MD  tamsulosin (FLOMAX) 0.4 MG CAPS capsule TAKE 1 CAPSULE EVERY DAY 08/04/22   Shade Flood, MD  Turmeric (QC TUMERIC COMPLEX PO) Take 1 tablet by mouth in the morning.    [provider]      Allergies    Nitrostat [nitroglycerin], Ace inhibitors, Mevacor [lovastatin], and Solarcaine [benzocaine]    Review of Systems   Review of Systems  Constitutional:  Positive for appetite change (decreased) and fever.  Musculoskeletal:  Positive for arthralgias and myalgias.  Neurological:  Positive for dizziness and headaches.  Negative for syncope, facial asymmetry, speech difficulty, weakness and light-headedness.    Physical Exam Updated Vital Signs BP (!) 121/49   Pulse 89   Temp 98.2 F (36.8 C) (Oral)   Resp 19   SpO2 98%  Physical Exam Vitals and nursing note reviewed.  Constitutional:      General: He is not in acute distress.    Appearance: Normal appearance. He is not ill-appearing or diaphoretic.  HENT:     Head: Normocephalic and atraumatic.  Eyes:     General: Lids are normal. Vision grossly intact.     Extraocular Movements: Extraocular movements intact.     Conjunctiva/sclera: Conjunctivae normal.     Pupils: Pupils are equal, round, and reactive to light.  Cardiovascular:     Rate and Rhythm: Normal rate and regular rhythm.  Pulmonary:     Effort: Pulmonary effort is normal.  Abdominal:     General: Abdomen is flat.     Palpations: Abdomen is soft.     Tenderness: There is no abdominal tenderness.  Musculoskeletal:     Cervical back: Full passive range of motion without pain.  Skin:    General: Skin is warm and dry.     Capillary Refill: Capillary refill takes less than 2 seconds.     Findings: No lesion or rash.  Neurological:     Mental Status: He is alert and oriented to person, place, and time. Mental status is at baseline.     GCS: GCS eye subscore is 4. GCS verbal subscore is 5. GCS motor subscore is 6.     Cranial Nerves: Cranial nerves 2-12 are intact.     Sensory: Sensation is intact.     Motor: Motor function is intact.     Coordination: Coordination is intact.     Comments: Patient is moving all extremities appropriately.  5/5 strength in bilateral upper and lower extremities.  Gross sensation intact.  CN 2-12 grossly intact.  Speech is clear, no slurring.  Gait not assessed.    Psychiatric:        Mood and Affect: Mood normal.        Behavior: Behavior normal.    ED Results / Procedures / Treatments   Labs (all labs ordered are listed, but only abnormal results  are displayed) Labs Reviewed  BASIC METABOLIC PANEL - Abnormal; Notable for the following components:      Result Value   Sodium 132 (*)    Chloride 97 (*)    Glucose, Bld 117 (*)    Creatinine, Ser 1.29 (*)    Calcium 8.7 (*)    GFR, Estimated 57 (*)    All other components within normal limits  CBC - Abnormal; Notable for the following components:   Hemoglobin 12.5 (*)    HCT 38.8 (*)    Platelets 102 (*)    All  other components within normal limits  SARS CORONAVIRUS 2 BY RT PCR  URINALYSIS, W/ REFLEX TO CULTURE (INFECTION SUSPECTED)  TROPONIN I (HIGH SENSITIVITY)  TROPONIN I (HIGH SENSITIVITY)    EKG EKG Interpretation  Date/Time:  Tuesday Aug 23 2022 15:10:32 EDT Ventricular Rate:  95 PR Interval:  174 QRS Duration: 136 QT Interval:  376 QTC Calculation: 472 R Axis:   -28 Text Interpretation: Atrial-sensed ventricular-paced rhythm Abnormal ECG When compared with ECG of 29-Oct-2021 23:42, PREVIOUS ECG IS PRESENT Confirmed by Vonita Moss [phone]) on 08/23/2022 5:43:27 PM  Radiology DG Chest Portable 1 View  Result Date: 08/23/2022 CLINICAL DATA:  Dyspnea on exertion EXAM: PORTABLE CHEST 1 VIEW COMPARISON:  10/29/2021 FINDINGS: Stable appearance of the pacer. Lower cervical plate and screw fixator. Heart size currently within normal limits. The lungs appear clear. No blunting of the costophrenic angles. IMPRESSION: 1. No active cardiopulmonary disease is radiographically apparent. 2. Stable appearance of the pacer. Electronically Signed   By: Gaylyn Rong M.D.   On: 08/23/2022 18:10   CT Head Wo Contrast  Result Date: 08/23/2022 CLINICAL DATA:  Headache, increasing frequency or severity. EXAM: CT HEAD WITHOUT CONTRAST TECHNIQUE: Contiguous axial images were obtained from the base of the skull through the vertex without intravenous contrast. RADIATION DOSE REDUCTION: This exam was performed according to the departmental dose-optimization program which includes  automated exposure control, adjustment of the mA and/or kV according to patient size and/or use of iterative reconstruction technique. COMPARISON:  Head CT 03/05/2016 FINDINGS: Brain: There is no evidence of an acute infarct, intracranial hemorrhage, mass, midline shift, or extra-axial fluid collection. The ventricles and sulci are within normal limits for age. Vascular: No hyperdense vessel. Skull: No acute fracture or suspicious osseous lesion. Sinuses/Orbits: No significant inflammatory changes in the included paranasal sinuses or mastoid air cells. Unremarkable orbits. Other: None. IMPRESSION: Negative head CT. Electronically Signed   By: Sebastian Ache M.D.   On: 08/23/2022 16:33    Procedures Procedures  {Document cardiac monitor, telemetry assessment procedure when appropriate:1}  Medications Ordered in ED Medications - No data to display  ED Course/ Medical Decision Making/ A&P   {   Click here for ABCD2, HEART and other calculatorsREFRESH Note before signing :1}                          Medical Decision Making Amount and/or Complexity of Data Reviewed Labs: ordered. Radiology: ordered.  Risk OTC drugs.   This patient presents to the ED with chief complaint(s) of *** with pertinent past medical history of ***.  The complaint involves an extensive differential diagnosis and also carries with it a high risk of complications and morbidity.    The differential diagnosis includes ***   The initial plan is to ***  Additional history obtained: Additional history obtained from {additional history:26846} Records reviewed {records:26847}  Initial Assessment:   ***  Independent ECG/labs interpretation:  The following labs were independently interpreted:  ***  Independent visualization and interpretation of imaging: I independently visualized the following imaging with scope of interpretation limited to determining acute life threatening conditions related to emergency care: ***,  which revealed ***  Treatment and Reassessment: ***  Other treatment options considered:   ***  Disposition:   ***  Social Determinants of Health:   Patient's {UEAV:40981}  increases the complexity of managing their presentation   {Document critical care time when appropriate:1} {Document review of labs and clinical decision tools ie heart  score, Chads2Vasc2 etc:1}  {Document your independent review of radiology images, and any outside records:1} {Document your discussion with family members, caretakers, and with consultants:1} {Document social determinants of health affecting pt's care:1} {Document your decision making why or why not admission, treatments were needed:1} Final Clinical Impression(s) / ED Diagnoses Final diagnoses:  None    Rx / DC Orders ED Discharge Orders     None

## 2022-08-23 NOTE — ED Provider Notes (Addendum)
[facility]    CSN: [id number] Arrival date & time: 08/23/22  1337      History   Chief Complaint Chief Complaint  Patient presents with   Weakness   Insomnia   bodyaches   loss of balance    HPI [name] [name] is a 77 y.o. male presents for numerous issues.  Patient reports over the past 3 days he has had weakness, insomnia, body aches,, dizziness, waxing and waning headache that he describes as "terrible" and ranges from anywhere from a 4 out of 10 to a 15 out of 10.  He denies any vomiting, URI symptoms, syncope.  He does have a complex medical history of CVA, CHF with AICD, hyperlipidemia.  He states the headache he is currently experiencing is similar to the headache he had when he had his previous stroke.  Denies any residual deficits secondary to that stroke.  He also states he is so exhausted because he has not been able to sleep over the past 3 days.  States he has had nothing but water for the past 3 days and has not been able to eat anything.  He is requesting something to help him sleep.  He has taken Tylenol somewhat helps his headache.   Weakness Associated symptoms: dizziness and headaches   Insomnia Associated symptoms include headaches.    Past Medical History:  Diagnosis Date   Arthritis    BPH (benign prostatic hypertrophy)    CHF (congestive heart failure) (HCC)    Elevated PSA    Eye problem 03/06/2016   Dr. Jerilynn Birkenhead retinal detachment left eye, no sx done   History of gout    pt 05-20-2013 states stable    History of melanoma excision    2011-- LEFT UPPER BACK   Hyperlipemia    Hypertriglyceridemia    ICD (implantable cardioverter-defibrillator) battery depletion 10/27/2021   Left bundle branch block 08/2017   Concern for LBBB mediated cardiomyopathy   Melanoma (HCC) 2011   back   Nonischemic cardiomyopathy (HCC) 09/2020   08/2009: EF 40-45%, Global HK-> 09/2017 -> EF 45-50% ; 3/'22: EF<20%.  Elevated LVEDP.;  09/2020: EF 20-25%.  GR 1  DD   Numbness    hands   Personal history of arthritis    Personal history of other diseases of circulatory system    Pre-diabetes     Patient Active Problem List   Diagnosis Date Noted   Fatigue due to treatment 08/17/2022   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   Hx of colonic polyps 01/10/2022   Atrial fibrillation, transient (HCC) 11/02/2021   Hypotension    Chest pain 10/28/2021   Prostate cancer (HCC) 04/02/2021   Chronic HFrEF (heart failure with reduced ejection fraction) (HCC) 05/20/2020   Claudication (HCC) 09/01/2019   S/P cervical spinal fusion 04/20/2018   Paresthesia 01/10/2018   Neck pain 01/10/2018   LBBB (left bundle branch block) 09/07/2017   Other meniscus derangements, posterior horn of medial meniscus, left knee, insufficency fracture medial tibial plateau 04/07/2017   Closed nondisplaced fracture of lateral condyle of left tibia with routine healing 04/07/2017   Status post arthroscopy of left knee 04/07/2017   Insomnia 09/14/2015   Hyperlipidemia with target LDL less than 100 09/11/2014   Rotator cuff tendinitis 04/28/2014   Obesity (BMI 30-39.9) 12/29/2013   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   Exertional shortness of breath 06/26/2012   Polyp of colon, adenomatous 04/27/2012   Elevated PSA 10/20/2011   Hypertriglyceridemia 07/08/2011  Nonischemic cardiomyopathy (HCC) 09/04/2009    Past Surgical History:  Procedure Laterality Date   ANTERIOR CERVICAL DECOMP/DISCECTOMY FUSION N/A 04/20/2018   Procedure: Anterior Cervical Decompression Fusion - Cervical Three-Cervical Four - Cervical Four-Cervical Five - Cervical Five-Cervical Six;  Surgeon: Tia Alert, MD;  Location: [facility] OR;  Service: Neurosurgery;  Laterality: N/A;  Anterior Cervical Decompression Fusion - Cervical Three-Cervical Four - Cervical Four-Cervical Five - Cervical Five-Cervical Six   APPENDECTOMY  AS CHILD   BACK SURGERY     BIV ICD INSERTION CRT-D  N/A 10/27/2021   Procedure: BIV ICD INSERTION CRT-D;  Surgeon: Marinus Maw, MD;  Location: [facility] INVASIVE CV LAB;  Service: Cardiovascular;  Laterality: N/A;   CARDIOVASCULAR STRESS TEST  10/05/1998   MILD GLOBAL HYPOKINESIS AND ISCHEMIAIN ANTEROSEPTAL  AT APEX/ EF 42%   CARPAL TUNNEL RELEASE Right    CENTRAL LINE INSERTION  10/28/2021   Procedure: CENTRAL LINE INSERTION;  Surgeon: Regan Lemming, MD;  Location: [facility] INVASIVE CV LAB;  Service: Cardiovascular;;   CENTRAL LINE INSERTION  10/28/2021   Procedure: CENTRAL LINE INSERTION;  Surgeon: Tonny Bollman, MD;  Location: [facility] INVASIVE CV LAB;  Service: Cardiovascular;;   COLONOSCOPY WITH PROPOFOL N/A 03/14/2022   Procedure: COLONOSCOPY WITH PROPOFOL;  Surgeon: Napoleon Form, MD;  Location: [facility] ENDOSCOPY;  Service: Gastroenterology;  Laterality: N/A;   EXCISION MELANOMA LEFT UPPER BACK  10/14/2009   eye lid surgery Bilateral 2018   GOLD SEED IMPLANT N/A 07/23/2021   Procedure: GOLD SEED IMPLANT;  Surgeon: Crist Fat, MD;  Location: [facility] ORS;  Service: Urology;  Laterality: N/A;   KNEE ARTHROSCOPY WITH SUBCHONDROPLASTY Left 04/07/2017   Procedure: LEFT KNEE ARTHROSCOPY WITH PARTIAL MEDIAL MENISCECTOMY AND MEDIAL TIBIAL SUBCHONDROPLASTY;  Surgeon: Kathryne Hitch, MD;  Location: [facility] ORS;  Service: Orthopedics;  Laterality: Left;   knee injections Bilateral    LEAD REVISION/REPAIR N/A 10/28/2021   Procedure: LEAD REVISION/REPAIR;  Surgeon: Regan Lemming, MD;  Location: [facility] INVASIVE CV LAB;  Service: Cardiovascular;  Laterality: N/A;   LEFT HEART CATH AND CORONARY ANGIOGRAPHY  04/2002   minimal irregularities in the LAD/  EF 50%  -   DR AL LITTLE   LUMBAR DISC SURGERY  09/12/2000   left  L5 -- S1   PERICARDIOCENTESIS N/A 10/28/2021   Procedure: PERICARDIOCENTESIS;  Surgeon: Regan Lemming, MD;  Location: [facility] INVASIVE CV LAB;  Service: Cardiovascular;  Laterality: N/A;   PERICARDIOCENTESIS N/A 10/28/2021   Procedure:  PERICARDIOCENTESIS;  Surgeon: Tonny Bollman, MD;  Location: [facility] INVASIVE CV LAB;  Service: Cardiovascular;  Laterality: N/A;   PROSTATE BIOPSY N/A 05/22/2013   Procedure: BIOPSY TRANSRECTAL ULTRASONIC PROSTATE (TUBP);  Surgeon: Valetta Fuller, MD;  Location: [facility];  Service: Urology;  Laterality: N/A;   RIGHT/LEFT HEART CATH AND CORONARY ANGIOGRAPHY N/A 06/25/2020   Procedure: RIGHT/LEFT HEART CATH AND CORONARY ANGIOGRAPHY;  Surgeon: Lyn Records, MD;  Location: [facility] INVASIVE CV LAB;:; Normal coronary arteries; LVEDP 23 mmHg; PCWP 19 mmHg.   ROTATOR CUFF REPAIR Right 2016   SPACE OAR INSTILLATION N/A 07/23/2021   Procedure: SPACE OAR INSTILLATION;  Surgeon: Crist Fat, MD;  Location: [facility] ORS;  Service: Urology;  Laterality: N/A;   THROAT SURGERY  04/20/2018   TRANSRECTAL ULTRASOUND PROSTATE BX  05-23-2005  &  04-19-2001   TRANSTHORACIC ECHOCARDIOGRAM  09/2017   EF 45-50 % (previously reported as 40 to 45%).  Incoordinate septal motion with mild LVH.  GR 1 DD.  Aortic  sclerosis but no stenosis.   TRANSTHORACIC ECHOCARDIOGRAM  10/06/2020   Severely reduced EF of 20 to 25%.  No LV thrombus.  Severe septal-lateral wall systolic dyssynchrony due to LBBB.  Global HK.  Moderately dilated LV.  GR 1 DD with mild LA dilation..  Unable to assess PAP with normal RV size and function.  Normal RAP.  Mild AOV sclerosis but no stenosis.  Mild to moderate MR.   TRANSTHORACIC ECHOCARDIOGRAM  06/16/2020   Severely reduced EF <20%.  Moderate severely dilated LV.  GR 2 DD.  Elevated LVEDP.  Mild LA dilation.  Mildly reduced RV function.  Mild MR.  Mild aortic valve calcification       Home Medications    Prior to Admission medications   Medication Sig Start Date End Date Taking? Authorizing Provider  acetaminophen (TYLENOL) 500 MG tablet Take 1,000 mg by mouth every 6 (six) hours as needed for moderate pain.    [provider]  aspirin EC 81 MG tablet Take 1 tablet (81 mg  total) by mouth daily. 04/26/18   Costella, Darci Current, PA-C  carvedilol (COREG) 3.125 MG tablet TAKE 1 TABLET (3.125 MG TOTAL) 2 TIMES DAILY WITH A MEAL. 12/01/21   [name], MD  colchicine 0.6 MG tablet Take 1 tablet (0.6 mg total) by mouth 2 (two) times daily. 05/16/22 08/17/22  Shade Flood, MD  Cyanocobalamin (VITAMIN B-12 PO) Take 5,000 mcg by mouth daily.    [provider]  docusate sodium (COLACE) 100 MG capsule Take 100 mg by mouth at bedtime.    [provider]  finasteride (PROSCAR) 5 MG tablet Take 5 mg by mouth daily in the afternoon. 03/10/22   [provider]  furosemide (LASIX) 40 MG tablet TAKE 1 TABLET TWICE DAILY Patient taking differently: Take 40 mg by mouth 2 (two) times a week. 01/10/22   [name], MD  gabapentin (NEURONTIN) 300 MG capsule Take 1-2 capsules (300-600 mg total) by mouth 3 (three) times daily as needed (pain). Patient taking differently: Take 300-600 mg by mouth 2 (two) times daily. 02/26/21   Shade Flood, MD  hydrALAZINE (APRESOLINE) 25 MG tablet TAKE 1 TABLET (25 MG TOTAL) BY MOUTH IN THE MORNING AND AT BEDTIME. 07/11/22   [name], MD  MAGNESIUM PO Take 400 mg by mouth in the morning.    [provider]  Multiple Vitamin (MULTIVITAMIN WITH MINERALS) TABS tablet Take 1 tablet by mouth daily. One-A-Day Men Health Multivitamin    [provider]  Multiple Vitamins-Minerals (ZINC PO) Take 50 mg by mouth in the morning.    [provider]  naproxen sodium (ALEVE) 220 MG tablet Take 440 mg by mouth daily as needed (pain).    [provider]  Omega-3 Fatty Acids (FISH OIL PO) Take 1,000 mg by mouth in the morning.    [provider]  rosuvastatin (CRESTOR) 10 MG tablet TAKE 1 TABLET EVERY DAY Patient taking differently: Take 10 mg by mouth at bedtime. 12/27/21   Shade Flood, MD  tamsulosin (FLOMAX) 0.4 MG CAPS capsule TAKE 1 CAPSULE EVERY DAY 08/04/22   Shade Flood, MD  Turmeric (QC TUMERIC COMPLEX PO) Take 1 tablet by mouth in the morning.    [provider]    Family History Family History  Problem Relation Age of Onset   Arthritis Mother    COPD Father    Lung cancer Father    Cancer Sister  type unknown   Breast cancer Sister    Lung cancer Sister    Cardiomyopathy Other        idiopathic. trivial disease 2004 cath, 2010 cardiac CT - no sig. dz, nl LV fxn. cards: Little   Colon cancer Neg Hx     Social History Social History   Tobacco Use   Smoking status: Former    Packs/day: 2.00    Years: 20.00    Additional pack years: 0.00    Total pack years: 40.00    Types: Cigarettes    Quit date: 03/28/1980    Years since quitting: 42.4   Smokeless tobacco: Never  Vaping Use   Vaping Use: Never used  Substance Use Topics   Alcohol use: No    Alcohol/week: 0.0 standard drinks of alcohol   Drug use: No     Allergies   Nitrostat [nitroglycerin], Ace inhibitors, Mevacor [lovastatin], and Solarcaine [benzocaine]   Review of Systems Review of Systems  Constitutional:  Positive for appetite change.  Neurological:  Positive for dizziness, weakness and headaches.       Insomnia  Psychiatric/Behavioral:  The patient has insomnia.      Physical Exam Triage Vital Signs ED Triage Vitals  Enc Vitals Group     BP 08/23/22 1352 111/63     Pulse Rate 08/23/22 1351 97     Resp --      Temp 08/23/22 1352 99 F (37.2 C)     Temp Source 08/23/22 1352 Oral     SpO2 08/23/22 1351 93 %     Weight --      Height --      Head Circumference --      Peak Flow --      Pain Score 08/23/22 1353 7     Pain Loc --      Pain Edu? --      Excl. in GC? --    No data found.  Updated Vital Signs BP 111/63 (BP Location: Right Arm)   Pulse 97   Temp 99 F (37.2 C) (Oral)   SpO2 93%   Visual Acuity Right Eye Distance:   Left Eye Distance:   Bilateral Distance:    Right Eye Near:   Left Eye Near:    Bilateral  Near:     Physical Exam Vitals and nursing note reviewed.  Constitutional:      Appearance: Normal appearance.     Comments: Patient is teary-eyed when speaking about his symptoms specifically his insomnia and states he simply wants to rest  HENT:     Head: Normocephalic and atraumatic.  Eyes:     Extraocular Movements: Extraocular movements intact.     Conjunctiva/sclera: Conjunctivae normal.     Pupils: Pupils are equal, round, and reactive to light.  Cardiovascular:     Rate and Rhythm: Normal rate.  Pulmonary:     Effort: Pulmonary effort is normal.  Skin:    General: Skin is warm and dry.  Neurological:     Mental Status: He is alert and oriented to person, place, and time.     GCS: GCS eye subscore is 4. GCS verbal subscore is 5. GCS motor subscore is 6.     Cranial Nerves: No facial asymmetry.     Motor: No weakness or pronator drift.     Coordination: Finger-Nose-Finger Test normal.     Comments: Negative FAST exam  Psychiatric:     Comments: Teary-eyed agitated  UC Treatments / Results  Labs (all labs ordered are listed, but only abnormal results are displayed) Labs Reviewed - No data to display  EKG   Radiology No results found.  Procedures Procedures (including critical care time)  Medications Ordered in UC Medications - No data to display  Initial Impression / Assessment and Plan / UC Course  I have reviewed the triage vital signs and the nursing notes.  Pertinent labs & imaging results that were available during my care of the patient were reviewed by me and considered in my medical decision making (see chart for details).     Discussed symptoms with patient.  Reviewed limitations and abilities of urgent care.  Given his medical history including history of CVA, persistent waxing and waning headache that is similar to when he had a previous CVA, and his other symptoms of insomnia and dizziness, I advised to go to the emergency room via EMS for  further evaluation and treatment.  Patient is in agreement with plan was taken via EMS to the emergency room. Final Clinical Impressions(s) / UC Diagnoses   Final diagnoses:  Acute intractable headache, unspecified headache type  Insomnia, unspecified type  Dizziness  History of CVA (cerebrovascular accident)     Discharge Instructions      Patient taken to ER via EMS     ED Prescriptions   None    PDMP not reviewed this encounter.   Radford Pax, NP 08/23/22 1424    Radford Pax, NP 08/23/22 1425

## 2022-08-23 NOTE — ED Triage Notes (Addendum)
Pt sent by urgent care for further evaluation; Pt c/o insomnia, HA, dizziness x 3 days; took tylenol with no improvement; hx stroke, stroke screen negative with ems, denies n/v; denies falls; denies dizziness currently, states worse with standing ; 12 lead unremarkable with ems, hx pacemaker placement; denies CP; no focal deficits, no slurred speech; 122/60, HR 82, 97% RA, cbg 160

## 2022-08-23 NOTE — ED Triage Notes (Addendum)
Pt presents with  weakness and insomnia x 3 days. Had a stroke a few months and c/o body aches when moving.  Denies, cough, fever, sore throat, congestion, n/v/d.  Pt states he can't get out of bed without feeling loss of balance. C/o severe headache since Friday, has taken tylenol that helped a little.

## 2022-08-23 NOTE — Telephone Encounter (Signed)
He needs to be evaluated today, especially for any concern for possible tickborne illness.  At the minimum a virtual visit or e-visit today to discuss medication and possible testing.  If we do not have openings I recommend urgent care evaluation tonight.

## 2022-08-23 NOTE — Discharge Instructions (Addendum)
Thank you for allowing me to be a part of your care today.   Your work-up today did not show an emergent cause for your symptoms today.  We obtained blood cultures and you will be notified if they are positive.  The appropriate treatment will be administered should they come back positive.  Your chest x-ray did not show pneumonia or fluid in your lungs.  Your head CT did not show evidence of stroke, brain bleed, or swelling of the brain.   Continue to take Tylenol 1000 mg every 6 hours as needed for body aches, headache, and fevers.  For sleep, we recommend trying over-the-counter melatonin first.  DO NOT exceed 4000 mg of Tylenol in a 24-hour period.  Please schedule a follow up appointment with your primary care provider as soon as possible.  Return to the ED if you develop sudden worsening of your symptoms or if you have any new concerns.

## 2022-08-23 NOTE — ED Notes (Signed)
ED Provider at bedside. 

## 2022-08-24 ENCOUNTER — Telehealth: Payer: Self-pay | Admitting: Family Medicine

## 2022-08-24 ENCOUNTER — Ambulatory Visit: Payer: Medicare HMO | Admitting: Family Medicine

## 2022-08-24 LAB — CULTURE, BLOOD (ROUTINE X 2): Culture: NO GROWTH

## 2022-08-24 NOTE — Telephone Encounter (Signed)
Will call pt this morning.

## 2022-08-24 NOTE — Telephone Encounter (Signed)
Thank you for the clarification.  We needed to take that off his med list then.  There is nothing about that in the note from Dr. Ladona Ridgel.

## 2022-08-25 LAB — CULTURE, BLOOD (ROUTINE X 2)

## 2022-08-26 ENCOUNTER — Telehealth: Payer: Self-pay

## 2022-08-26 LAB — CULTURE, BLOOD (ROUTINE X 2)

## 2022-08-26 NOTE — Transitions of Care (Post Inpatient/ED Visit) (Signed)
   08/26/2022  Name: [name] [name] MRN: [id number] DOB: 06/10/1945  Today's TOC FU Call Status: Today's TOC FU Call Status:: Unsuccessul Call (1st Attempt) Unsuccessful Call (1st Attempt) Date: 08/26/22  Red on EMMI-ED Discharge Alert Date & Reason:08/25/22 "Scheduled follow-up appt? No"  Attempted to reach the patient regarding the most recent Inpatient/ED visit.  Follow Up Plan: Additional outreach attempts will be made to reach the patient to complete the Transitions of Care (Post Inpatient/ED visit) call.     Antionette Fairy, RN,BSN,CCM [facility] Care Management Care Management Community Coordinator Direct Phone: [phone] Toll Free: [phone] Fax: [phone]

## 2022-08-27 LAB — CULTURE, BLOOD (ROUTINE X 2)
Special Requests: ADEQUATE
Special Requests: ADEQUATE

## 2022-08-28 LAB — CULTURE, BLOOD (ROUTINE X 2): Culture: NO GROWTH

## 2022-08-29 ENCOUNTER — Telehealth: Payer: Self-pay

## 2022-08-29 NOTE — Transitions of Care (Post Inpatient/ED Visit) (Signed)
08/29/2022  Name: [name] [name] MRN: [id number] DOB: [birth year]  Today's TOC FU Call Status: Today's TOC FU Call Status:: Successful TOC FU Call Competed TOC FU Call Complete Date: 08/29/22  Red on EMMI-ED Discharge Alert Date & Reason:08/25/22 "Scheduled follow-up appt? No"  Transition Care Management Follow-up Telephone Call Date of Discharge: 08/23/22 Discharge Facility: [facility]) Type of Discharge: Emergency Department Reason for ED Visit: Other: ("body aches") How have you been since you were released from the hospital?: Better (Pt pleased to report all "his symptoms just left-went away-like a miracle has happened and no more issues") Any questions or concerns?: No  Items Reviewed: Did you receive and understand the discharge instructions provided?: Yes Medications obtained,verified, and reconciled?: Partial Review Completed Reason for Partial Mediation Review: brief call as pt had company arrive Any new allergies since your discharge?: No Dietary orders reviewed?: Yes Type of Diet Ordered:: low salt/heart healthy Do you have support at home?: Yes People in Home: alone, significant other Name of Support/Comfort Primary Source: [name]  Medications Reviewed Today: Medications Reviewed Today     Reviewed by Charlyn Minerva, RN (Registered Nurse) on 08/29/22 at [phone]  Med List Status: <None>   Medication Order Taking? Sig Documenting Provider Last Dose Status Informant  acetaminophen (TYLENOL) 500 MG tablet [id number] No Take 1,000 mg by mouth every 6 (six) hours as needed for moderate pain. [provider] Taking Active Self  aspirin EC 81 MG tablet [id number] No Take 1 tablet (81 mg total) by mouth daily. Alyson Ingles, PA-C Taking Active Self           Med Note Maryclare Labrador   Fri Jan 28, 2022 12:35 PM)    carvedilol (COREG) 3.125 MG tablet [id number] No TAKE 1 TABLET (3.125 MG TOTAL) 2 TIMES DAILY WITH A MEAL. [name], MD Taking  Active Self  colchicine 0.6 MG tablet [id number] No Take 1 tablet (0.6 mg total) by mouth 2 (two) times daily. Shade Flood, MD Taking Expired 08/17/22 2359   Cyanocobalamin (VITAMIN B-12 PO) [id number] No Take 5,000 mcg by mouth daily. [provider] Taking Active Self  docusate sodium (COLACE) 100 MG capsule [id number] No Take 100 mg by mouth at bedtime. [provider] Taking Active Self  finasteride (PROSCAR) 5 MG tablet [id number] No Take 5 mg by mouth daily in the afternoon. [provider] Taking Active Self  furosemide (LASIX) 40 MG tablet [id number] No TAKE 1 TABLET TWICE DAILY  Patient taking differently: Take 40 mg by mouth 2 (two) times a week.   [name], MD Taking Active Self  gabapentin (NEURONTIN) 300 MG capsule [id number] No Take 1-2 capsules (300-600 mg total) by mouth 3 (three) times daily as needed (pain).  Patient taking differently: Take 300-600 mg by mouth 2 (two) times daily.   Shade Flood, MD Taking Active Self  hydrALAZINE (APRESOLINE) 25 MG tablet [id number] No TAKE 1 TABLET (25 MG TOTAL) BY MOUTH IN THE MORNING AND AT BEDTIME. [name], MD Taking Active   MAGNESIUM PO [id number] No Take 400 mg by mouth in the morning. [provider] Taking Active Self  Multiple Vitamin (MULTIVITAMIN WITH MINERALS) TABS tablet [id number] No Take 1 tablet by mouth daily. One-A-Day Men Health Multivitamin [provider] Taking Active Self  Multiple Vitamins-Minerals (ZINC PO) [id number] No Take 50 mg by mouth in the morning. [provider] Taking Active Self  naproxen sodium (ALEVE) 220 MG tablet [id number] No  Take 440 mg by mouth daily as needed (pain). [provider] Taking Active Self  Omega-3 Fatty Acids (FISH OIL PO) [id number] No Take 1,000 mg by mouth in the morning. [provider] Taking Active Self  rosuvastatin (CRESTOR) 10 MG tablet [id number] No TAKE 1 TABLET EVERY DAY  Patient taking  differently: Take 10 mg by mouth at bedtime.   Shade Flood, MD Taking Active Self  sodium chloride flush (NS) 0.9 % injection 3 mL [id number]   Ronney Asters, NP  Active   tamsulosin (FLOMAX) 0.4 MG CAPS capsule [id number] No TAKE 1 CAPSULE EVERY DAY Shade Flood, MD Taking Active   Turmeric (QC TUMERIC COMPLEX PO) [id number] No Take 1 tablet by mouth in the morning. [provider] Taking Active Self            Home Care and Equipment/Supplies: Were Home Health Services Ordered?: NA Any new equipment or medical supplies ordered?: NA  Functional Questionnaire: Do you need assistance with bathing/showering or dressing?: No Do you need assistance with meal preparation?: No Do you need assistance with eating?: No Do you have difficulty maintaining continence: No Do you need assistance with getting out of bed/getting out of a chair/moving?: No Do you have difficulty managing or taking your medications?: No  Follow up appointments reviewed: PCP Follow-up appointment confirmed?: Yes Date of PCP follow-up appointment?: 09/07/22 Follow-up Provider: Dr. Neva Seat Specialist [facility] Follow-up appointment confirmed?: NA Do you need transportation to your follow-up appointment?: No Do you understand care options if your condition(s) worsen?: Yes-patient verbalized understanding  SDOH Interventions Today    Flowsheet Row Most Recent Value  SDOH Interventions   Food Insecurity Interventions Intervention Not Indicated  Transportation Interventions Intervention Not Indicated       TOC Interventions Today    Flowsheet Row Most Recent Value  TOC Interventions   TOC Interventions Discussed/Reviewed TOC Interventions Discussed      Interventions Today    Flowsheet Row Most Recent Value  General Interventions   General Interventions Discussed/Reviewed General Interventions Discussed, Doctor Visits  Doctor Visits Discussed/Reviewed Doctor Visits Discussed, PCP   PCP/Specialist Visits Compliance with follow-up visit  Education Interventions   Education Provided Provided Education  Provided Verbal Education On Nutrition, When to see the doctor, Medication  Nutrition Interventions   Nutrition Discussed/Reviewed Nutrition Discussed  Pharmacy Interventions   Pharmacy Dicussed/Reviewed Pharmacy Topics Discussed  Safety Interventions   Safety Discussed/Reviewed Safety Discussed        Alessandra Grout [facility] Care Management Care Management Community Coordinator Direct Phone: [phone] Toll Free: [phone] Fax: [phone]

## 2022-09-03 ENCOUNTER — Other Ambulatory Visit: Payer: Self-pay | Admitting: Student

## 2022-09-07 ENCOUNTER — Encounter: Payer: Self-pay | Admitting: Family Medicine

## 2022-09-07 ENCOUNTER — Ambulatory Visit (INDEPENDENT_AMBULATORY_CARE_PROVIDER_SITE_OTHER): Payer: Medicare HMO | Admitting: Family Medicine

## 2022-09-07 VITALS — BP 124/62 | HR 78 | Temp 98.1°F | Ht 68.0 in | Wt 192.6 lb

## 2022-09-07 DIAGNOSIS — E871 Hypo-osmolality and hyponatremia: Secondary | ICD-10-CM

## 2022-09-07 DIAGNOSIS — R509 Fever, unspecified: Secondary | ICD-10-CM

## 2022-09-07 DIAGNOSIS — I5022 Chronic systolic (congestive) heart failure: Secondary | ICD-10-CM | POA: Diagnosis not present

## 2022-09-07 DIAGNOSIS — D696 Thrombocytopenia, unspecified: Secondary | ICD-10-CM

## 2022-09-07 LAB — CBC
HCT: 37.3 % — ABNORMAL LOW (ref 39.0–52.0)
Hemoglobin: 12.2 g/dL — ABNORMAL LOW (ref 13.0–17.0)
MCHC: 32.6 g/dL (ref 30.0–36.0)
MCV: 90.4 fl (ref 78.0–100.0)
Platelets: 188 10*3/uL (ref 150.0–400.0)
RBC: 4.12 Mil/uL — ABNORMAL LOW (ref 4.22–5.81)
RDW: 13.5 % (ref 11.5–15.5)
WBC: 6.3 10*3/uL (ref 4.0–10.5)

## 2022-09-07 LAB — BASIC METABOLIC PANEL
BUN: 24 mg/dL — ABNORMAL HIGH (ref 6–23)
CO2: 26 mEq/L (ref 19–32)
Calcium: 9.1 mg/dL (ref 8.4–10.5)
Chloride: 106 mEq/L (ref 96–112)
Creatinine, Ser: 1.16 mg/dL (ref 0.40–1.50)
GFR: 60.39 mL/min (ref >60.00)
Glucose, Bld: 122 mg/dL — ABNORMAL HIGH (ref 70–99)
Potassium: 4.5 mEq/L (ref 3.5–5.1)
Sodium: 137 mEq/L (ref 135–145)

## 2022-09-07 NOTE — Patient Instructions (Signed)
Glad to hear that you have improved from the suspected viral illness.  If any return of the symptoms be seen right away.  I do not expect that to happen. I will check some of the blood tests that were abnormal in the ER but those were only minimally changed from previous readings.  I will let you know if there are concerns on those results.  Keep follow-up with cardiology for echocardiogram and office visit as planned.  I suspect the heart failure is the main contributor to the fatigue but I am happy to see back if they feel like other evaluation is needed.  92-month follow-up with me if doing well, but again happy to see you sooner if needed.  Take care.

## 2022-09-07 NOTE — Progress Notes (Signed)
Subjective:  Patient ID: [name] [name], male    DOB: 12-25-1945  Age: 77 y.o. MRN: [id number]  CC:  Chief Complaint  Patient presents with   Hospitalization Follow-up    [facility] Pt was in the hospital for intense pain body aches and headaches, notes they did blood culture but negative, notes he gradually improved and went home,      HPI [name] [name] presents for   Transition of care visit.  TOC phone call completed on 08/29/2022, reviewed.  No needs identified at that time. Seen in person today.  Evaluated in the ER on 08/23/2022.  Note reviewed.  Nonspecific flulike symptoms with fever, chills, muscle aches.  Several ticks were found but no rash.  No rash on exam, no new murmurs.  No ticks were identified during his exam in the ER.  No neurologic deficits or meningismus.  Unremarkable CT head, chest x-ray, blood cultures were negative, COVID-negative, low concern for tickborne illness, suspected viral illness.  Recommended PCP follow-up within a few days. Has been feeling well, symptoms resolved within a few days.  Was starting to improve in ER. HA and bodyaches resolved with tylenol. No recurrence. No rash. Feeling ok recently. Still some decreased energy level since earlier this year. Not able to walk as far, or persistent work in the [employer] - less endurance. No acute changes. No chest pain. Hx HFrEF, nonischemic cardiomyopathy. last appt with Dr. Herbie Baltimore 5/22. Prior drop in EF, exertional fatigue. Has BiV ICD, med changes - titration of afterload reduction,  planned depending on echo. Limited by low blood pressures prior.  Echo planned on 6/25, then appt with cardiology 09/26/22.   Hematuria Noted on urinalysis at ER.  0-5 WBC at that time, 6-10 RBC.He has been followed by urology with history of prostate cancer, BPH and microscopic hematuria.  Last visit in October 2023.  Plan for PSA every 6 months, office visit in 12 months.  Hyponatremia Sodium 132 at ER visit May  28.  Previously 139 in March.  Creatinine minimally changed from 1.21-1.29, calcium borderline elevated 8.7.  Thrombocytopenia Chronic,Noted again during ER evaluation.  Platelets 102 at that time, previously 128, 95. Asa 81 mg qd to qod. Some bruising on arms at times.    History Patient Active Problem List   Diagnosis Date Noted   Fatigue due to treatment 08/17/2022   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   Hx of colonic polyps 01/10/2022   Atrial fibrillation, transient (HCC) 11/02/2021   Hypotension    Chest pain 10/28/2021   Prostate cancer (HCC) 04/02/2021   Chronic HFrEF (heart failure with reduced ejection fraction) (HCC) 05/20/2020   Claudication (HCC) 09/01/2019   S/P cervical spinal fusion 04/20/2018   Paresthesia 01/10/2018   Neck pain 01/10/2018   LBBB (left bundle branch block) 09/07/2017   Other meniscus derangements, posterior horn of medial meniscus, left knee, insufficency fracture medial tibial plateau 04/07/2017   Closed nondisplaced fracture of lateral condyle of left tibia with routine healing 04/07/2017   Status post arthroscopy of left knee 04/07/2017   Insomnia 09/14/2015   Hyperlipidemia with target LDL less than 100 09/11/2014   Rotator cuff tendinitis 04/28/2014   Obesity (BMI 30-39.9) 12/29/2013   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   Exertional shortness of breath 06/26/2012   Polyp of colon, adenomatous 04/27/2012   Elevated PSA 10/20/2011   Hypertriglyceridemia 07/08/2011   Nonischemic cardiomyopathy (HCC) 09/04/2009   Past Medical History:  Diagnosis Date  Arthritis    BPH (benign prostatic hypertrophy)    CHF (congestive heart failure) (HCC)    Elevated PSA    Eye problem 03/06/2016   Dr. Jerilynn Birkenhead retinal detachment left eye, no sx done   History of gout    pt 05-20-2013 states stable    History of melanoma excision    2011-- LEFT UPPER BACK   Hyperlipemia    Hypertriglyceridemia    ICD (implantable  cardioverter-defibrillator) battery depletion 10/27/2021   Left bundle branch block 08/2017   Concern for LBBB mediated cardiomyopathy   Melanoma (HCC) 2011   back   Nonischemic cardiomyopathy (HCC) 09/2020   08/2009: EF 40-45%, Global HK-> 09/2017 -> EF 45-50% ; 3/'22: EF<20%.  Elevated LVEDP.;  09/2020: EF 20-25%.  GR 1 DD   Numbness    hands   Personal history of arthritis    Personal history of other diseases of circulatory system    Pre-diabetes    Past Surgical History:  Procedure Laterality Date   ANTERIOR CERVICAL DECOMP/DISCECTOMY FUSION N/A 04/20/2018   Procedure: Anterior Cervical Decompression Fusion - Cervical Three-Cervical Four - Cervical Four-Cervical Five - Cervical Five-Cervical Six;  Surgeon: Tia Alert, MD;  Location: [facility] OR;  Service: Neurosurgery;  Laterality: N/A;  Anterior Cervical Decompression Fusion - Cervical Three-Cervical Four - Cervical Four-Cervical Five - Cervical Five-Cervical Six   APPENDECTOMY  AS CHILD   BACK SURGERY     BIV ICD INSERTION CRT-D N/A 10/27/2021   Procedure: BIV ICD INSERTION CRT-D;  Surgeon: Marinus Maw, MD;  Location: [facility] INVASIVE CV LAB;  Service: Cardiovascular;  Laterality: N/A;   CARDIOVASCULAR STRESS TEST  10/05/1998   MILD GLOBAL HYPOKINESIS AND ISCHEMIAIN ANTEROSEPTAL  AT [facility]/ EF 42%   CARPAL TUNNEL RELEASE Right    CENTRAL LINE INSERTION  10/28/2021   Procedure: CENTRAL LINE INSERTION;  Surgeon: Regan Lemming, MD;  Location: [facility] INVASIVE CV LAB;  Service: Cardiovascular;;   CENTRAL LINE INSERTION  10/28/2021   Procedure: CENTRAL LINE INSERTION;  Surgeon: Tonny Bollman, MD;  Location: [facility] INVASIVE CV LAB;  Service: Cardiovascular;;   COLONOSCOPY WITH PROPOFOL N/A 03/14/2022   Procedure: COLONOSCOPY WITH PROPOFOL;  Surgeon: Napoleon Form, MD;  Location: [facility] ENDOSCOPY;  Service: Gastroenterology;  Laterality: N/A;   EXCISION MELANOMA LEFT UPPER BACK  10/14/2009   eye lid surgery Bilateral 2018   GOLD SEED IMPLANT N/A  07/23/2021   Procedure: GOLD SEED IMPLANT;  Surgeon: Crist Fat, MD;  Location: [facility] ORS;  Service: Urology;  Laterality: N/A;   KNEE ARTHROSCOPY WITH SUBCHONDROPLASTY Left 04/07/2017   Procedure: LEFT KNEE ARTHROSCOPY WITH PARTIAL MEDIAL MENISCECTOMY AND MEDIAL TIBIAL SUBCHONDROPLASTY;  Surgeon: Kathryne Hitch, MD;  Location: [facility] ORS;  Service: Orthopedics;  Laterality: Left;   knee injections Bilateral    LEAD REVISION/REPAIR N/A 10/28/2021   Procedure: LEAD REVISION/REPAIR;  Surgeon: Regan Lemming, MD;  Location: [facility] INVASIVE CV LAB;  Service: Cardiovascular;  Laterality: N/A;   LEFT HEART CATH AND CORONARY ANGIOGRAPHY  04/2002   minimal irregularities in the LAD/  EF 50%  -   DR AL LITTLE   LUMBAR DISC SURGERY  09/12/2000   left  L5 -- S1   PERICARDIOCENTESIS N/A 10/28/2021   Procedure: PERICARDIOCENTESIS;  Surgeon: Regan Lemming, MD;  Location: [facility] INVASIVE CV LAB;  Service: Cardiovascular;  Laterality: N/A;   PERICARDIOCENTESIS N/A 10/28/2021   Procedure: PERICARDIOCENTESIS;  Surgeon: Tonny Bollman, MD;  Location: [facility] INVASIVE CV LAB;  Service: Cardiovascular;  Laterality:  N/A;   PROSTATE BIOPSY N/A 05/22/2013   Procedure: BIOPSY TRANSRECTAL ULTRASONIC PROSTATE (TUBP);  Surgeon: Valetta Fuller, MD;  Location: [facility];  Service: Urology;  Laterality: N/A;   RIGHT/LEFT HEART CATH AND CORONARY ANGIOGRAPHY N/A 06/25/2020   Procedure: RIGHT/LEFT HEART CATH AND CORONARY ANGIOGRAPHY;  Surgeon: Lyn Records, MD;  Location: [facility] INVASIVE CV LAB;:; Normal coronary arteries; LVEDP 23 mmHg; PCWP 19 mmHg.   ROTATOR CUFF REPAIR Right 2016   SPACE OAR INSTILLATION N/A 07/23/2021   Procedure: SPACE OAR INSTILLATION;  Surgeon: Crist Fat, MD;  Location: [facility] ORS;  Service: Urology;  Laterality: N/A;   THROAT SURGERY  04/20/2018   TRANSRECTAL ULTRASOUND PROSTATE BX  05-23-2005  &  04-19-2001   TRANSTHORACIC ECHOCARDIOGRAM  09/2017   EF 45-50 % (previously  reported as 40 to 45%).  Incoordinate septal motion with mild LVH.  GR 1 DD.  Aortic sclerosis but no stenosis.   TRANSTHORACIC ECHOCARDIOGRAM  10/06/2020   Severely reduced EF of 20 to 25%.  No LV thrombus.  Severe septal-lateral wall systolic dyssynchrony due to LBBB.  Global HK.  Moderately dilated LV.  GR 1 DD with mild LA dilation..  Unable to assess PAP with normal RV size and function.  Normal RAP.  Mild AOV sclerosis but no stenosis.  Mild to moderate MR.   TRANSTHORACIC ECHOCARDIOGRAM  06/16/2020   Severely reduced EF <20%.  Moderate severely dilated LV.  GR 2 DD.  Elevated LVEDP.  Mild LA dilation.  Mildly reduced RV function.  Mild MR.  Mild aortic valve calcification   Allergies  Allergen Reactions   Nitrostat [Nitroglycerin] Anaphylaxis    "Cardiologist suggested he shouldn't take this med because it could kill him with his heart condition. Lowers BP"   Ace Inhibitors Swelling   Mevacor [Lovastatin] Nausea Only   Solarcaine [Benzocaine] Dermatitis   Prior to Admission medications   Medication Sig Start Date End Date Taking? Authorizing Provider  amiodarone (PACERONE) 200 MG tablet TAKE 1 TABLET EVERY DAY 09/05/22   Graciella Freer, PA-C  acetaminophen (TYLENOL) 500 MG tablet Take 1,000 mg by mouth every 6 (six) hours as needed for moderate pain.    [provider]  aspirin EC 81 MG tablet Take 1 tablet (81 mg total) by mouth daily. 04/26/18   Costella, Darci Current, PA-C  carvedilol (COREG) 3.125 MG tablet TAKE 1 TABLET (3.125 MG TOTAL) 2 TIMES DAILY WITH A MEAL. 12/01/21   [name], MD  colchicine 0.6 MG tablet Take 1 tablet (0.6 mg total) by mouth 2 (two) times daily. 05/16/22 08/17/22  Shade Flood, MD  Cyanocobalamin (VITAMIN B-12 PO) Take 5,000 mcg by mouth daily.    [provider]  docusate sodium (COLACE) 100 MG capsule Take 100 mg by mouth at bedtime.    [provider]  finasteride (PROSCAR) 5 MG tablet Take 5 mg by mouth daily in  the afternoon. 03/10/22   [provider]  furosemide (LASIX) 40 MG tablet TAKE 1 TABLET TWICE DAILY Patient taking differently: Take 40 mg by mouth 2 (two) times a week. 01/10/22   [name], MD  gabapentin (NEURONTIN) 300 MG capsule Take 1-2 capsules (300-600 mg total) by mouth 3 (three) times daily as needed (pain). Patient taking differently: Take 300-600 mg by mouth 2 (two) times daily. 02/26/21   Shade Flood, MD  hydrALAZINE (APRESOLINE) 25 MG tablet TAKE 1 TABLET (25 MG TOTAL) BY MOUTH IN THE MORNING AND  AT BEDTIME. 07/11/22   [name], MD  MAGNESIUM PO Take 400 mg by mouth in the morning.    [provider]  Multiple Vitamin (MULTIVITAMIN WITH MINERALS) TABS tablet Take 1 tablet by mouth daily. One-A-Day Men Health Multivitamin    [provider]  naproxen sodium (ALEVE) 220 MG tablet Take 440 mg by mouth daily as needed (pain).    [provider]  Omega-3 Fatty Acids (FISH OIL PO) Take 1,000 mg by mouth in the morning.    [provider]  rosuvastatin (CRESTOR) 10 MG tablet TAKE 1 TABLET EVERY DAY Patient taking differently: Take 10 mg by mouth at bedtime. 12/27/21   Shade Flood, MD  tamsulosin (FLOMAX) 0.4 MG CAPS capsule TAKE 1 CAPSULE EVERY DAY 08/04/22   Shade Flood, MD  Turmeric (QC TUMERIC COMPLEX PO) Take 1 tablet by mouth in the morning.    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  Tobacco Use   Smoking status: Former    Packs/day: 2.00    Years: 20.00    Additional pack years: 0.00    Total pack years: 40.00    Types: Cigarettes    Quit date: 03/28/1980    Years since quitting: 42.4   Smokeless tobacco: Never  Vaping  Use   Vaping Use: Never used  Substance and Sexual Activity   Alcohol use: No    Alcohol/week: 0.0 standard drinks of alcohol   Drug use: No   Sexual activity: Not on file  Other Topics Concern   Not on file  Social History Narrative   He walks on a relatively regular basis about 15 minutes at a time mostly to the discomfort. He previously had walked up a 30-40 minutes a time without problems. Education: [employer].   Lives alone.   Right-handed.   One cup coffee daily.   Social Determinants of Health   Financial Resource Strain: Low Risk  (11/23/2021)   Overall Financial Resource Strain (CARDIA)    Difficulty of Paying Living Expenses: Not hard at all  Food Insecurity: No Food Insecurity (08/29/2022)   Hunger Vital Sign    Worried About Running Out of Food in the Last Year: Never true    Ran Out of Food in the Last Year: Never true  Transportation Needs: No Transportation Needs (08/29/2022)   PRAPARE - Administrator, Civil Service (Medical): No    Lack of Transportation (Non-Medical): No  Physical Activity: Insufficiently Active (11/23/2021)   Exercise Vital Sign    Days of Exercise per Week: 3 days    Minutes of Exercise per Session: 30 min  Stress: No Stress Concern Present (11/23/2021)   [employer] of Occupational Health - Occupational Stress Questionnaire    Feeling of Stress : Not at all  Social Connections: Socially Integrated (11/23/2021)   Social Connection and Isolation Panel [NHANES]    Frequency of Communication with Friends and Family: More than three times a week    Frequency of Social Gatherings with Friends and Family: More than three times a week    Attends Religious Services: More than 4 times per year    Active Member of  Clubs or Organizations: Yes    Attends Banker Meetings: More than 4 times per year    Marital Status: Living with partner  Intimate Partner Violence: Not At Risk (11/23/2021)   Humiliation, Afraid, Rape, and Kick  questionnaire    Fear of Current or Ex-Partner: No    Emotionally Abused: No    Physically Abused: No    Sexually Abused: No    Review of Systems Per HPI.   Objective:   Vitals:   09/07/22 1011  BP: 124/62  Pulse: 78  Temp: 98.1 F (36.7 C)  TempSrc: Temporal  SpO2: 97%  Weight: 192 lb 9.6 oz (87.4 kg)  Height: 5\' 8"  (1.727 m)     Physical Exam Vitals reviewed.  Constitutional:      Appearance: He is well-developed.  HENT:     Head: Normocephalic and atraumatic.  Neck:     Vascular: No carotid bruit or JVD.  Cardiovascular:     Rate and Rhythm: Normal rate and regular rhythm.     Heart sounds: Normal heart sounds. No murmur heard. Pulmonary:     Effort: Pulmonary effort is normal.     Breath sounds: Normal breath sounds. No rales.  Musculoskeletal:     Right lower leg: No edema.     Left lower leg: No edema.  Skin:    General: Skin is warm and dry.  Neurological:     Mental Status: He is alert and oriented to person, place, and time.  Psychiatric:        Mood and Affect: Mood normal.        Assessment & Plan:  [name] [name] is a 77 y.o. male . Febrile illness  -Workup as above in ER, symptoms quickly resolved.  Probable viral illness, less likely tickborne illness.  As symptoms resolved without recurrence we will hold on further testing at this time with RTC/ER precautions given.  Chronic HFrEF (heart failure with reduced ejection fraction) (HCC)  -With persistent fatigue with activity.  Denies acute changes or chest pain.  Continue follow-up with cardiology, echocardiogram planned as above.  Plan for afterload reduction titration.   Thrombocytopenia (HCC) - Plan: CBC  -Mild on ER visit.  Check updated labs.  Infrequent use of aspirin at this time.  Hyponatremia - Plan: Basic metabolic panel  -Mild on ER labs, repeat testing and adjust plan accordingly.  No orders of the defined types were placed in this encounter.  Patient Instructions   Glad to hear that you have improved from the suspected viral illness.  If any return of the symptoms be seen right away.  I do not expect that to happen. I will check some of the blood tests that were abnormal in the ER but those were only minimally changed from previous readings.  I will let you know if there are concerns on those results.  Keep follow-up with cardiology for echocardiogram and office visit as planned.  I suspect the heart failure is the main contributor to the fatigue but I am happy to see back if they feel like other evaluation is needed.  96-month follow-up with me if doing well, but again happy to see you sooner if needed.  Take care.     Signed,   Meredith Staggers, MD [facility], [facility] 09/07/22 10:12 AM

## 2022-09-19 ENCOUNTER — Ambulatory Visit (INDEPENDENT_AMBULATORY_CARE_PROVIDER_SITE_OTHER): Payer: Medicare HMO | Admitting: Family Medicine

## 2022-09-19 ENCOUNTER — Encounter: Payer: Self-pay | Admitting: Family Medicine

## 2022-09-19 VITALS — BP 128/58 | HR 90 | Temp 98.6°F | Ht 68.0 in | Wt 191.2 lb

## 2022-09-19 DIAGNOSIS — T148XXA Other injury of unspecified body region, initial encounter: Secondary | ICD-10-CM | POA: Diagnosis not present

## 2022-09-19 MED ORDER — DOXYCYCLINE HYCLATE 100 MG PO TABS
100.0000 mg | ORAL_TABLET | Freq: Two times a day (BID) | ORAL | 0 refills | Status: AC
Start: 2022-09-19 — End: 2022-09-26

## 2022-09-19 NOTE — Progress Notes (Signed)
   Acute Office Visit   Subjective:  Patient ID: [name] [name], male    DOB: 1946-02-12, 77 y.o.   MRN: [id number]  Chief Complaint  Patient presents with   Bite    Pt is here today with C/O of bite marks on RT leg. Pt reports he has no idea what happen Pt reports he has been bit by snake in past Pt denies pain,fever,soreness. Pt has used OTC ointment on this.    HPI Patient is a 77 year old caucasian male that presents with complaints puncture marks on right lower leg. He is uncertain of when this occurred, but reports he has been bit in the past by a snake. He reports he noticed it last week, but has got worse on Friday. He reports it has become more erythema.  Denies any pain, fever, swelling, fatigue, or soreness.   He has been cleaning the wound with chlorhexidine and Bacitracin. Started on Friday evening and been doing twice a day.   ROS See HPI above      Objective:   BP (!) 128/58   Pulse 90   Temp 98.6 F (37 C)   Ht 5\' 8"  (1.727 m)   Wt 191 lb 4 oz (86.8 kg)   SpO2 97%   BMI 29.08 kg/m    Physical Exam Vitals reviewed.  Constitutional:      General: He is not in acute distress.    Appearance: Normal appearance. He is not ill-appearing, toxic-appearing or diaphoretic.  HENT:     Head: Normocephalic and atraumatic.  Eyes:     General:        Right eye: No discharge.        Left eye: No discharge.     Conjunctiva/sclera: Conjunctivae normal.  Cardiovascular:     Rate and Rhythm: Normal rate.  Pulmonary:     Effort: Pulmonary effort is normal. No respiratory distress.  Musculoskeletal:        General: Normal range of motion.  Skin:    General: Skin is warm and dry.     Findings: Wound (Right lower leg, two puncture wounds with erythema at base of wound and around wound. No drainage noted. No swelling.) present.  Neurological:     General: No focal deficit present.     Mental Status: He is alert and oriented to person, place, and time. Mental  status is at baseline.  Psychiatric:        Mood and Affect: Mood normal.        Behavior: Behavior normal.        Thought Content: Thought content normal.        Judgment: Judgment normal.          Assessment & Plan:  Puncture wound -     Doxycycline Hyclate; Take 1 tablet (100 mg total) by mouth 2 (two) times daily for 7 days.  Dispense: 14 tablet; Refill: 0  -Prescribed Doxycycline 100mg , 1 tablet twice a day for 7 days.  -Recommend to continue with cleansing the area with chlorhexidine and apply bacitracin twice daily.  -Discussed about red flags of reason to return back promptly.  -Follow up if not improved.   Zandra Abts, NP

## 2022-09-19 NOTE — Telephone Encounter (Signed)
Patient evaluated in office by Zandra Abts today.

## 2022-09-19 NOTE — Patient Instructions (Signed)
-  It was a pleasure to meet you and care for you.  -START Doxycycline 100mg , 1 tablet twice a day for 7 days.  -Recommend to continue with cleansing the area with chlorhexidine and apply bacitracin twice daily.  -Discussed about red flags of reason to return back promptly.  -Follow up if not improved.

## 2022-09-20 ENCOUNTER — Ambulatory Visit (HOSPITAL_COMMUNITY): Payer: Medicare HMO | Attending: Cardiovascular Disease

## 2022-09-20 DIAGNOSIS — I4891 Unspecified atrial fibrillation: Secondary | ICD-10-CM | POA: Diagnosis not present

## 2022-09-20 DIAGNOSIS — I428 Other cardiomyopathies: Secondary | ICD-10-CM | POA: Insufficient documentation

## 2022-09-20 DIAGNOSIS — Z95810 Presence of automatic (implantable) cardiac defibrillator: Secondary | ICD-10-CM | POA: Insufficient documentation

## 2022-09-20 DIAGNOSIS — I5022 Chronic systolic (congestive) heart failure: Secondary | ICD-10-CM | POA: Diagnosis not present

## 2022-09-20 LAB — ECHOCARDIOGRAM COMPLETE
Area-P 1/2: 3.23 cm2
S' Lateral: 5 cm

## 2022-09-26 ENCOUNTER — Ambulatory Visit: Payer: Medicare HMO | Attending: Cardiology | Admitting: Cardiology

## 2022-09-26 ENCOUNTER — Encounter: Payer: Self-pay | Admitting: Cardiology

## 2022-09-26 VITALS — BP 112/64 | HR 81 | Ht 68.0 in | Wt 193.4 lb

## 2022-09-26 DIAGNOSIS — E781 Pure hyperglyceridemia: Secondary | ICD-10-CM | POA: Diagnosis not present

## 2022-09-26 DIAGNOSIS — I5022 Chronic systolic (congestive) heart failure: Secondary | ICD-10-CM

## 2022-09-26 DIAGNOSIS — I428 Other cardiomyopathies: Secondary | ICD-10-CM

## 2022-09-26 DIAGNOSIS — I4891 Unspecified atrial fibrillation: Secondary | ICD-10-CM | POA: Diagnosis not present

## 2022-09-26 DIAGNOSIS — R5382 Chronic fatigue, unspecified: Secondary | ICD-10-CM | POA: Diagnosis not present

## 2022-09-26 DIAGNOSIS — E785 Hyperlipidemia, unspecified: Secondary | ICD-10-CM

## 2022-09-26 DIAGNOSIS — I447 Left bundle-branch block, unspecified: Secondary | ICD-10-CM | POA: Diagnosis not present

## 2022-09-26 MED ORDER — HYDRALAZINE HCL 50 MG PO TABS: 50.0000 mg | ORAL_TABLET | Freq: Two times a day (BID) | ORAL | 3 refills | Status: DC

## 2022-09-26 MED ORDER — ROSUVASTATIN CALCIUM 10 MG PO TABS
10.0000 mg | ORAL_TABLET | Freq: Every day | ORAL | 0 refills | Status: DC
Start: 2022-09-26 — End: 2023-01-25

## 2022-09-26 NOTE — Progress Notes (Signed)
Cardiology Office Note:  .   Date:  10/01/2022  ID:  [name], DOB 12/03/1945, MRN [id number] PCP: Shade Flood, MD  [facility] Providers Cardiologist:  Bryan Lemma, MD Electrophysiologist:  [name], MD     Chief Complaint  Patient presents with   Follow-up    1 month follow-up-echo result.   Cardiomyopathy    Nonischemic; still notes exertional dyspnea and fatigue but no PND orthopnea or edema.    History of Present Illness: .     [name] [name] is a 77 y.o. male with a pertinent PMH noted  below who presents here for 1 month follow-up.   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  Moderate elevated LVEDP.  Mean PAP 24 mmHg. ICD 10/27/2021  Hospitalized 8/3-10/2021-sharp chest pain after ICD implantation => RV lead revision (microperforation) developed cardiac tamponade; pericardial drain. Started on amiodarone for rapid A-fib as well as wide-complex atrial tachycardia => not started on DOAC-likely related to recent procedure. 6-week course of colchicine EF 25 to 30%, global HK by most recent Echo (had been down to 20-25%).  Trivial pericardial effusion PAF (first noted after ICD lead revision).  No longer onon amiodarone. HLD on rosuvastatin.    [name] [name] was last seen on 08/17/2022-did very well over the wintertime but had a right eye "stroke ".  Noticing more easy fatigue with reduced energy.  No chest pain or pressure.  No necessarily notable dyspnea.  Still working out in the yard.  No PND orthopnea. => Plan was to recheck 2D Echo post BiV ICD.  Consider adding spironolactone.  Was taking Lasix 2 times a week.  We discussed sliding scale.  Also considered as show diameter.    Subjective   INTERVAL HISTORY 08/23/2022-most [place]: Presented with nonspecific flulike illnesses of subjective fevers chills and headaches and muscle aches.  Noted several tick bites  but no rashes.  No temperature at home, but temperature of 100 F in the ER.  Noted feeling pain" every 5 or if his body ".  Given 1000 g Tylenol with improvement of bodyaches and headache.  Blood cultures checked.Discharged home.  Lewayne returns here today accompanied by his family really with no major differences per last visit.  We discussed his likely viral illness in late May when he started feeling poorly on May 26, to the ER on the 28th and felt better by the 29th.  Still having some fatigue from.  But at baseline he does have decreased stamina and fatigue that is more prominent than dyspnea.  Just loss of endurance and.  He still has doses, but is no longer splitting water No other vigorous work.  He does not go out in the splint would do other vigorous activity, and with his basic yard work, he has to stop and rest to catch his breath every so often.  He does not get short of breath withsimplywalkingaroundthehouse but more short of breath if he does much more than that.  (If he does any exercise, climbs up stairs or carries anything heavy PRN short of breath.  No chest pain or pressure with rest or exertion.  Thankfully, no sensation of palpitations or irregular heartbeats.  ROS:  Cardiovascular ROS: positive for - dyspnea on exertion and exercise intolerance, loss of stamina and endurance negative for - chest pain, edema, irregular heartbeat, orthopnea, palpitations, paroxysmal nocturnal dyspnea, rapid heart rate, shortness of breath, or lightheadedness,  dizziness wooziness, syncope/near syncope or TIA/RCVS or claudication Review of Systems - Negative except mild muscle aches and fatigue, joint pain and memory loss.    Objective   Studies Reviewed: Marland Kitchen        ECHO 09/20/2022: EF 20 to 25%.  Severely decreased function.  Global HK.  Mild LVH.  GR 1 DD with elevated LAP.  Mild LA dilation.  Trivial MR.  Normal PAP.  Moderate AoV calcification-sclerosis but no stenosis.  Normal RAP.  Past CV  Procedure history:  Procedure  Date   BIV ICD INSERTION CRT-D  10/27/2021   Procedure: BIV ICD INSERTION CRT-D;  Surgeon: Marinus Maw, MD;  Location: [facility] INVASIVE CV LAB;  Service: Cardiovascular;  Laterality: N/A;   CARDIOVASCULAR STRESS TEST  10/05/1998   MILD GLOBAL HYPOKINESIS AND ISCHEMIAIN ANTEROSEPTAL  AT APEX/ EF 42%   LEAD REVISION/REPAIR  10/28/2021   Procedure: LEAD REVISION/REPAIR;  Surgeon: Regan Lemming, MD;  Location: [facility] INVASIVE CV LAB;  Service: Cardiovascular;  Laterality: N/A;   LEFT HEART CATH AND CORONARY ANGIOGRAPHY  04/2002   minimal irregularities in the LAD/  EF 50%  -   DR AL LITTLE   PERICARDIOCENTESIS  10/28/2021   Procedure: PERICARDIOCENTESIS;  Surgeon: Regan Lemming, MD;  Location: [facility] INVASIVE CV LAB;  Service: Cardiovascular;  Laterality: N/A;   PERICARDIOCENTESIS  10/28/2021   Procedure: PERICARDIOCENTESIS;  Surgeon: Tonny Bollman, MD;  Location: [facility] INVASIVE CV LAB;  Service: Cardiovascular;  Laterality: N/A;   RIGHT/LEFT HEART CATH AND CORONARY ANGIOGRAPHY  06/25/2020   Procedure: RIGHT/LEFT HEART CATH AND CORONARY ANGIOGRAPHY;  Surgeon: Lyn Records, MD;  Location: [facility] INVASIVE CV LAB;:; Normal coronary arteries; LVEDP 23 mmHg; PCWP 19 mmHg.   TRANSTHORACIC ECHOCARDIOGRAM  09/2017   EF 45-50 % (previously reported as 40 to 45%).  Incoordinate septal motion with mild LVH.  GR 1 DD.  Aortic sclerosis but no stenosis.   TRANSTHORACIC ECHOCARDIOGRAM  10/06/2020   Severely reduced EF of 20 to 25%.  No LV thrombus.  Severe septal-lateral wall systolic dyssynchrony due to LBBB.  Global HK.  Moderately dilated LV.  GR 1 DD with mild LA dilation..  Unable to assess PAP with normal RV size and function.  Normal RAP.  Mild AOV sclerosis but no stenosis.  Mild to moderate MR.    Risk Assessment/Calculations:    CHA2DS2-VASc Score = 4   This indicates a 4.8% annual risk of stroke. The patient's score is based upon: CHF History: 1 HTN History: 1 Diabetes  History: 0 Stroke History: 0 Vascular Disease History: 0 Age Score: 2 Gender Score: 0  No longer on DOAC - transient post-op      Physical Exam:   VS:  BP 112/64 (BP Location: Left Arm, Patient Position: Sitting, Cuff Size: Normal)   Pulse 81   Ht 5\' 8"  (1.727 m)   Wt 193 lb 6.4 oz (87.7 kg)   SpO2 93%   BMI 29.41 kg/m    Wt Readings from Last 3 Encounters:  09/26/22 193 lb 6.4 oz (87.7 kg)  09/19/22 191 lb 4 oz (86.8 kg)  09/07/22 192 lb 9.6 oz (87.4 kg)    GEN: Well nourished, well groomed.  Relatively healthy appearing.  In no acute distress;  NECK: No JVD; No carotid bruits CARDIAC: Normal S1, S2; RRR, soft 1/6 SEM at RUSB.  S4 gallop.  No rubs.  Nondisplaced PMI. RESPIRATORY:  Clear to auscultation without rales, wheezing or rhonchi ; nonlabored, good air movement.  ABDOMEN: Soft, non-tender, non-distended EXTREMITIES: Trivial edema; No deformity    ASSESSMENT AND PLAN: .    Problem List Items Addressed This Visit       Cardiology Problems   Nonischemic cardiomyopathy (HCC) (Chronic)    Persistently low EF despite BiV ICD placement.  No real arrhythmia symptoms and no longer having A-fib issues off of amiodarone.  Medication regimen limited by low BP as well as angioedema to ACE inhibitor's which for the most part excludes ARB's and Arni's. We did have a exercise talk to fatigue we are weaning off beta-blocker and titrating up after reduction with hydralazine. Will be to consider SGLT2 inhibitor plus or minus spironolactone      Relevant Medications   hydrALAZINE (APRESOLINE) 50 MG tablet   rosuvastatin (CRESTOR) 10 MG tablet   LBBB (left bundle branch block) (Chronic)    Unfortunately, BiV ICD did not help improve EF due to bundle branch block.      Relevant Medications   hydrALAZINE (APRESOLINE) 50 MG tablet   rosuvastatin (CRESTOR) 10 MG tablet   Hypertriglyceridemia (Chronic)   Relevant Medications   hydrALAZINE (APRESOLINE) 50 MG tablet   rosuvastatin  (CRESTOR) 10 MG tablet   Hyperlipidemia with target LDL less than 100 (Chronic)    Remains on statin.  Labs followed by PCP.      Relevant Medications   hydrALAZINE (APRESOLINE) 50 MG tablet   rosuvastatin (CRESTOR) 10 MG tablet   Chronic HFrEF (heart failure with reduced ejection fraction) (HCC) - Primary (Chronic)    Stable if not mildly reduced EF with no improvement after BiV ICD placement.  That would go along with the symptoms not getting any better.  Question if he may need to increase his chronotropic response.  With him having fatigue, exertional dyspnea/exercise intolerance am wondering if some of this could just be heart rate responsiveness.  He is no longer on amiodarone and perhaps needs to be was limited by beta-blocker.  Plan: Wean off of carvedilol, and titrate hydralazine up to 50 mg twice daily Depending on his blood pressure response to the hydralazine change and stopping carvedilol, neck step would be to add spironolactone. Continued Lasix at least twice weekly and as needed for weight gain. Once we are able to ensure that he is no longer endorsing fatigue from medications, I think we can consider both spironolactone and SGLT2 inhibitor.      Relevant Medications   hydrALAZINE (APRESOLINE) 50 MG tablet   rosuvastatin (CRESTOR) 10 MG tablet   Atrial fibrillation, transient (HCC) (Chronic)    Very transient episode during his lead perforation and pericardiocentesis.  Has not had a breakthrough spell since and is no longer on amiodarone.  As such, no longer on anticoagulation.      Relevant Medications   hydrALAZINE (APRESOLINE) 50 MG tablet   rosuvastatin (CRESTOR) 10 MG tablet     Other   Fatigue           Dispo: Return in about 3 months (around 12/27/2022) for 3-4 month follow-up.  Total time spent: 34 min spent with patient + 16 min spent charting = 50 min    Signed, [name], MD, MS Bryan Lemma, M.D., M.S. Interventional Cardiologist  [facility]  Pager # [phone] Phone # [phone] [address] [place], [place] [address]

## 2022-09-26 NOTE — Patient Instructions (Addendum)
Medication Instructions:   Wean off  taking Carvedilol 3.125 mg  one tablet   for 7 days   while doing this  take your Hydralazine  1 and 1/2 tablet ( 37.5 mg) twice a day . Once you stop taking Carvedilol  -start taking Hydralazine 50 mg  twice a day    In one month ( on October 27, 2022 ) stop taking Rosuvastatin  ( cholesterol medication)   *If you need a refill on your cardiac medications before your next appointment, please call your pharmacy*   Lab Work: Not needed    Testing/Procedures: Not needed   Follow-Up: At [facility], you and your health needs are our priority.  As part of our continuing mission to provide you with exceptional heart care, we have created designated Provider Care Teams.  These Care Teams include your primary Cardiologist (physician) and Advanced Practice Providers (APPs -  Physician Assistants and Nurse Practitioners) who all work together to provide you with the care you need, when you need it.     Your next appointment:   3 month(s)  The format for your next appointment:   In Person  Provider:   Bryan Lemma, MD  or Juanda Crumble, PA-C, Joni Reining, DNP, ANP, Bernadene Person, NP, or Carlos Levering, NP

## 2022-10-01 ENCOUNTER — Encounter: Payer: Self-pay | Admitting: Cardiology

## 2022-10-01 NOTE — Assessment & Plan Note (Signed)
Unfortunately, BiV ICD did not help improve EF due to bundle branch block.

## 2022-10-01 NOTE — Assessment & Plan Note (Signed)
Very transient episode during his lead perforation and pericardiocentesis.  Has not had a breakthrough spell since and is no longer on amiodarone.  As such, no longer on anticoagulation.

## 2022-10-01 NOTE — Assessment & Plan Note (Signed)
Persistently low EF despite BiV ICD placement.  No real arrhythmia symptoms and no longer having A-fib issues off of amiodarone.  Medication regimen limited by low BP as well as angioedema to ACE inhibitor's which for the most part excludes ARB's and Arni's. We did have a exercise talk to fatigue we are weaning off beta-blocker and titrating up after reduction with hydralazine. Will be to consider SGLT2 inhibitor plus or minus spironolactone

## 2022-10-01 NOTE — Assessment & Plan Note (Signed)
Remains on statin.  Labs followed by PCP.

## 2022-10-01 NOTE — Assessment & Plan Note (Signed)
Stable if not mildly reduced EF with no improvement after BiV ICD placement.  That would go along with the symptoms not getting any better.  Question if he may need to increase his chronotropic response.  With him having fatigue, exertional dyspnea/exercise intolerance am wondering if some of this could just be heart rate responsiveness.  He is no longer on amiodarone and perhaps needs to be was limited by beta-blocker.  Plan: Wean off of carvedilol, and titrate hydralazine up to 50 mg twice daily Depending on his blood pressure response to the hydralazine change and stopping carvedilol, neck step would be to add spironolactone. Continued Lasix at least twice weekly and as needed for weight gain. Once we are able to ensure that he is no longer endorsing fatigue from medications, I think we can consider both spironolactone and SGLT2 inhibitor.

## 2022-10-27 ENCOUNTER — Ambulatory Visit (INDEPENDENT_AMBULATORY_CARE_PROVIDER_SITE_OTHER): Payer: Medicare HMO

## 2022-10-27 DIAGNOSIS — I428 Other cardiomyopathies: Secondary | ICD-10-CM | POA: Diagnosis not present

## 2022-10-27 LAB — CUP PACEART REMOTE DEVICE CHECK
Battery Remaining Longevity: 74 mo
Battery Remaining Percentage: 82 %
Battery Voltage: 3.01 V
Brady Statistic AP VP Percent: 3.5 %
Brady Statistic AP VS Percent: 1 %
Brady Statistic AS VP Percent: 94 %
Brady Statistic AS VS Percent: 2 %
Brady Statistic RA Percent Paced: 3.4 %
Date Time Interrogation Session: 20240801020021
HighPow Impedance: 79 Ohm
HighPow Impedance: 79 Ohm
Implantable Lead Connection Status: 753985
Implantable Lead Connection Status: 753985
Implantable Lead Connection Status: 753985
Implantable Lead Implant Date: 20230802
Implantable Lead Implant Date: 20230802
Implantable Lead Implant Date: 20230802
Implantable Lead Location: 753859
Implantable Lead Location: 753860
Implantable Lead Location: 753860
Implantable Lead Model: 7122
Implantable Pulse Generator Implant Date: 20230802
Lead Channel Impedance Value: 260 Ohm
Lead Channel Impedance Value: 400 Ohm
Lead Channel Impedance Value: 440 Ohm
Lead Channel Pacing Threshold Amplitude: 0.625 V
Lead Channel Pacing Threshold Amplitude: 0.75 V
Lead Channel Pacing Threshold Amplitude: 1 V
Lead Channel Pacing Threshold Pulse Width: 0.5 ms
Lead Channel Pacing Threshold Pulse Width: 0.5 ms
Lead Channel Pacing Threshold Pulse Width: 0.5 ms
Lead Channel Sensing Intrinsic Amplitude: 11.3 mV
Lead Channel Sensing Intrinsic Amplitude: 4.1 mV
Lead Channel Setting Pacing Amplitude: 0.25 V
Lead Channel Setting Pacing Amplitude: 2 V
Lead Channel Setting Pacing Amplitude: 2 V
Lead Channel Setting Pacing Pulse Width: 0.05 ms
Lead Channel Setting Pacing Pulse Width: 0.5 ms
Lead Channel Setting Sensing Sensitivity: 0.5 mV
Pulse Gen Serial Number: 5544603

## 2022-10-28 DIAGNOSIS — D485 Neoplasm of uncertain behavior of skin: Secondary | ICD-10-CM | POA: Diagnosis not present

## 2022-10-28 DIAGNOSIS — C44622 Squamous cell carcinoma of skin of right upper limb, including shoulder: Secondary | ICD-10-CM | POA: Diagnosis not present

## 2022-11-07 ENCOUNTER — Telehealth: Payer: Self-pay | Admitting: Cardiology

## 2022-11-07 NOTE — Telephone Encounter (Signed)
I s/w [name] from Dr. Jed Limerick, DDS office and informed them I did not see a clearance request. [name] was able to provide the information for procedure to me by phone.      Pre-operative Risk Assessment    Patient Name: [name] [name]  DOB: 03/13/1946 MRN: [id number]     Request for Surgical Clearance    Procedure:  Dental Extraction - Amount of Teeth to be Pulled:  1 TOOTH FOR SIMPLE EXTRACTION  Date of Surgery:  Clearance 11/08/22                                 Surgeon:  DR. Jed Limerick, DDS Surgeon's Group or Practice Name:  [facility] Phone number:  [phone] Fax number:  [phone]   Type of Clearance Requested:   - Medical ; DOES ASA NEED TO BE HELD?    Type of Anesthesia:  Local    Additional requests/questions:    Elpidio Anis   11/07/2022, 11:48 AM

## 2022-11-07 NOTE — Telephone Encounter (Signed)
[name] from Dentist office is requesting a callback regarding clearance that was faxed over on 10/27/2022 for procedure date tomorrow. Please advise

## 2022-11-07 NOTE — Telephone Encounter (Signed)
    Primary Cardiologist: Bryan Lemma, MD  Chart reviewed as part of pre-operative protocol coverage. Simple dental extractions are considered low risk procedures per guidelines and generally do not require any specific cardiac clearance. It is also generally accepted that for simple extractions and dental cleanings, there is no need to interrupt blood thinner therapy.   Patient has chronic systolic heart failure.  Medication titration in process (last seen in our office 09/26/2022) recent echo from June reviewed.   SBE prophylaxis is not required for the patient.  I will route this recommendation to the requesting party via Epic fax function and remove from pre-op pool.  Please call with questions.  Sharlene Dory, PA-C 11/07/2022, 12:02 PM

## 2022-11-08 NOTE — Progress Notes (Signed)
Remote ICD transmission.   

## 2022-11-10 ENCOUNTER — Encounter (INDEPENDENT_AMBULATORY_CARE_PROVIDER_SITE_OTHER): Payer: Self-pay

## 2022-11-17 DIAGNOSIS — C44622 Squamous cell carcinoma of skin of right upper limb, including shoulder: Secondary | ICD-10-CM | POA: Diagnosis not present

## 2022-12-01 ENCOUNTER — Ambulatory Visit (INDEPENDENT_AMBULATORY_CARE_PROVIDER_SITE_OTHER): Payer: Medicare HMO | Admitting: *Deleted

## 2022-12-01 DIAGNOSIS — Z0000 Encounter for general adult medical examination without abnormal findings: Secondary | ICD-10-CM | POA: Diagnosis not present

## 2022-12-01 NOTE — Progress Notes (Signed)
Subjective:   [name] [name] is a 77 y.o. male who presents for Medicare Annual/Subsequent preventive examination.  Visit Complete: Virtual  I connected with  [name] on 12/01/22 by a audio enabled telemedicine application and verified that I am speaking with the correct person using two identifiers.  Patient Location: Home  Provider Location: Home Office  I discussed the limitations of evaluation and management by telemedicine. The patient expressed understanding and agreed to proceed.  Vital Signs: Unable to obtain new vitals due to this being a telehealth visit.   Review of Systems     Cardiac Risk Factors include: male gender;advanced age (>67men, >37 women)     Objective:    There were no vitals filed for this visit. There is no height or weight on file to calculate BMI.     12/07/2021   11:16 AM 11/23/2021    3:18 PM 10/28/2021    4:45 AM 10/27/2021    8:42 AM 07/12/2021   10:49 AM 05/05/2021   12:41 PM 06/25/2020    8:50 AM  Advanced Directives  Does Patient Have a Medical Advance Directive? Yes Yes Yes Yes Yes Yes Yes  Type of Advance Directive Living will;Healthcare Power of [employer] Power of [place];Living will  Healthcare Power of [place];Living will Healthcare Power of [place];Living will Healthcare Power of [place];Living will Healthcare Power of [place];Living will  Does patient want to make changes to medical advance directive?       No - Patient declined  Copy of Healthcare Power of Attorney in Chart?  No - copy requested  No - copy requested     Would patient like information on creating a medical advance directive?       No - Patient declined    Current Medications (verified) Outpatient Encounter Medications as of 12/01/2022  Medication Sig   acetaminophen (TYLENOL) 500 MG tablet Take 1,000 mg by mouth every 6 (six) hours as needed for moderate pain.   aspirin EC 81 MG tablet Take 1 tablet (81 mg total) by mouth daily.    Cyanocobalamin (VITAMIN B-12 PO) Take 5,000 mcg by mouth daily.   docusate sodium (COLACE) 100 MG capsule Take 100 mg by mouth at bedtime.   finasteride (PROSCAR) 5 MG tablet Take 5 mg by mouth daily in the afternoon.   furosemide (LASIX) 40 MG tablet TAKE 1 TABLET TWICE DAILY (Patient taking differently: Take 40 mg by mouth 2 (two) times a week.)   gabapentin (NEURONTIN) 300 MG capsule Take 1-2 capsules (300-600 mg total) by mouth 3 (three) times daily as needed (pain). (Patient taking differently: Take 300-600 mg by mouth 2 (two) times daily.)   MAGNESIUM PO Take 400 mg by mouth in the morning.   Multiple Vitamin (MULTIVITAMIN WITH MINERALS) TABS tablet Take 1 tablet by mouth daily. One-A-Day Men Health Multivitamin   naproxen sodium (ALEVE) 220 MG tablet Take 440 mg by mouth daily as needed (pain).   Omega-3 Fatty Acids (FISH OIL PO) Take 1,000 mg by mouth in the morning.   rosuvastatin (CRESTOR) 10 MG tablet Take 1 tablet (10 mg total) by mouth at bedtime. Stop taking on Oct 27, 2022   tamsulosin (FLOMAX) 0.4 MG CAPS capsule TAKE 1 CAPSULE EVERY DAY   Turmeric (QC TUMERIC COMPLEX PO) Take 1 tablet by mouth in the morning.   colchicine 0.6 MG tablet Take 1 tablet (0.6 mg total) by mouth 2 (two) times daily.   hydrALAZINE (APRESOLINE) 50 MG tablet Take 1 tablet (  50 mg total) by mouth in the morning and at bedtime. (Patient not taking: Reported on 12/01/2022)   Facility-Administered Encounter Medications as of 12/01/2022  Medication   sodium chloride flush (NS) 0.9 % injection 3 mL    Allergies (verified) Nitrostat [nitroglycerin], Ace inhibitors, Mevacor [lovastatin], and Solarcaine [benzocaine]   History: Past Medical History:  Diagnosis Date   Arthritis    BPH (benign prostatic hypertrophy)    CHF (congestive heart failure) (HCC)    Elevated PSA    Eye problem 03/06/2016   Dr. 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  Elevated LVEDP.;  09/2020: EF 20-25%.  GR 1 DD   Numbness    hands   Personal history of arthritis    Personal history of other diseases of circulatory system    Pre-diabetes    Sickle cell anemia (HCC)    Past Surgical History:  Procedure Laterality Date   ANTERIOR CERVICAL DECOMP/DISCECTOMY FUSION N/A 04/20/2018   Procedure: Anterior Cervical Decompression Fusion - Cervical Three-Cervical Four - Cervical Four-Cervical Five - Cervical Five-Cervical Six;  Surgeon: Tia Alert, MD;  Location: [facility] OR;  Service: Neurosurgery;  Laterality: N/A;  Anterior Cervical Decompression Fusion - Cervical Three-Cervical Four - Cervical Four-Cervical Five - Cervical Five-Cervical Six   APPENDECTOMY  AS CHILD   BACK SURGERY     BIV ICD INSERTION CRT-D N/A 10/27/2021   Procedure: BIV ICD INSERTION CRT-D;  Surgeon: Marinus Maw, MD;  Location: [facility] INVASIVE CV LAB;  Service: Cardiovascular;  Laterality: N/A;   CARDIOVASCULAR STRESS TEST  10/05/1998   MILD GLOBAL HYPOKINESIS AND ISCHEMIAIN ANTEROSEPTAL  AT APEX/ EF 42%   CARPAL TUNNEL RELEASE Right    CENTRAL LINE INSERTION  10/28/2021   Procedure: CENTRAL LINE INSERTION;  Surgeon: Regan Lemming, MD;  Location: [facility] INVASIVE CV LAB;  Service: Cardiovascular;;   CENTRAL LINE INSERTION  10/28/2021   Procedure: CENTRAL LINE INSERTION;  Surgeon: Tonny Bollman, MD;  Location: [facility] INVASIVE CV LAB;  Service: Cardiovascular;;   COLONOSCOPY WITH PROPOFOL N/A 03/14/2022   Procedure: COLONOSCOPY WITH PROPOFOL;  Surgeon: Napoleon Form, MD;  Location: [facility] ENDOSCOPY;  Service:  Gastroenterology;  Laterality: N/A;   EXCISION MELANOMA LEFT UPPER BACK  10/14/2009   eye lid surgery Bilateral 2018   GOLD SEED IMPLANT N/A 07/23/2021   Procedure: GOLD SEED IMPLANT;  Surgeon: Crist Fat, MD;  Location: [facility] ORS;  Service: Urology;  Laterality: N/A;   KNEE ARTHROSCOPY WITH SUBCHONDROPLASTY Left 04/07/2017   Procedure: LEFT KNEE ARTHROSCOPY WITH PARTIAL MEDIAL MENISCECTOMY AND MEDIAL TIBIAL SUBCHONDROPLASTY;  Surgeon: Kathryne Hitch, MD;  Location: [facility] ORS;  Service: Orthopedics;  Laterality: Left;   knee injections Bilateral    LEAD REVISION/REPAIR N/A 10/28/2021   Procedure: LEAD REVISION/REPAIR;  Surgeon: Regan Lemming, MD;  Location: [facility] INVASIVE CV LAB;  Service: Cardiovascular;  Laterality: N/A;   LEFT HEART CATH AND CORONARY ANGIOGRAPHY  04/2002   minimal irregularities in the LAD/  EF 50%  -  DR AL LITTLE   LUMBAR DISC SURGERY  09/12/2000   left  L5 -- S1   PERICARDIOCENTESIS N/A 10/28/2021   Procedure: PERICARDIOCENTESIS;  Surgeon: Regan Lemming, MD;  Location: [facility] INVASIVE CV LAB;  Service: Cardiovascular;  Laterality: N/A;   PERICARDIOCENTESIS N/A 10/28/2021   Procedure: PERICARDIOCENTESIS;  Surgeon: Tonny Bollman, MD;  Location: [facility] INVASIVE CV LAB;  Service: Cardiovascular;  Laterality: N/A;   PROSTATE BIOPSY N/A 05/22/2013   Procedure: BIOPSY TRANSRECTAL ULTRASONIC PROSTATE (TUBP);  Surgeon: Valetta Fuller, MD;  Location: [facility];  Service: Urology;  Laterality: N/A;   RIGHT/LEFT HEART CATH AND CORONARY ANGIOGRAPHY N/A 06/25/2020   Procedure: RIGHT/LEFT HEART CATH AND CORONARY ANGIOGRAPHY;  Surgeon: Lyn Records, MD;  Location: [facility] INVASIVE CV LAB;:; Normal coronary arteries; LVEDP 23 mmHg; PCWP 19 mmHg.   ROTATOR CUFF REPAIR Right 2016   SPACE OAR INSTILLATION N/A 07/23/2021   Procedure: SPACE OAR INSTILLATION;  Surgeon: Crist Fat, MD;  Location: [facility] ORS;  Service: Urology;  Laterality: N/A;   THROAT SURGERY   04/20/2018   TRANSRECTAL ULTRASOUND PROSTATE BX  05-23-2005  &  04-19-2001   TRANSTHORACIC ECHOCARDIOGRAM  09/2017   EF 45-50 % (previously reported as 40 to 45%).  Incoordinate septal motion with mild LVH.  GR 1 DD.  Aortic sclerosis but no stenosis.   TRANSTHORACIC ECHOCARDIOGRAM  10/06/2020   Severely reduced EF of 20 to 25%.  No LV thrombus.  Severe septal-lateral wall systolic dyssynchrony due to LBBB.  Global HK.  Moderately dilated LV.  GR 1 DD with mild LA dilation..  Unable to assess PAP with normal RV size and function.  Normal RAP.  Mild AOV sclerosis but no stenosis.  Mild to moderate MR.   TRANSTHORACIC ECHOCARDIOGRAM  06/16/2020   Severely reduced EF <20%.  Moderate severely dilated LV.  GR 2 DD.  Elevated LVEDP.  Mild LA dilation.  Mildly reduced RV function.  Mild MR.  Mild aortic valve calcification   Family History  Problem Relation Age of Onset   Arthritis Mother    COPD Father    Lung cancer Father    Cancer Sister        type unknown   Breast cancer Sister    Lung cancer Sister    Cardiomyopathy Other        idiopathic. trivial disease 2004 cath, 2010 cardiac CT - no sig. dz, nl LV fxn. 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  Tobacco Use   Smoking status: Former    Current packs/day: 0.00    Average packs/day: 2.0 packs/day for 20.0 years (40.0 ttl pk-yrs)    Types: Cigarettes    Start date: 03/28/1960    Quit date: 03/28/1980    Years since quitting: 42.7   Smokeless tobacco: Never  Vaping Use   Vaping status: Never Used  Substance and Sexual Activity   Alcohol use: No    Alcohol/week: 0.0 standard drinks of alcohol   Drug use: No   Sexual activity: Not Currently   Other Topics Concern   Not on file  Social History Narrative   He walks on a  relatively regular basis about 15 minutes at a time mostly to the discomfort. He previously had walked up a 30-40 minutes a time without problems. Education: [employer].   Lives alone.   Right-handed.   One cup coffee daily.   Social Determinants of Health   Financial Resource Strain: Low Risk  (12/01/2022)   Overall Financial Resource Strain (CARDIA)    Difficulty of Paying Living Expenses: Not hard at all  Food Insecurity: No Food Insecurity (12/01/2022)   Hunger Vital Sign    Worried About Running Out of Food in the Last Year: Never true    Ran Out of Food in the Last Year: Never true  Transportation Needs: No Transportation Needs (12/01/2022)   PRAPARE - Administrator, Civil Service (Medical): No    Lack of Transportation (Non-Medical): No  Physical Activity: Sufficiently Active (12/01/2022)   Exercise Vital Sign    Days of Exercise per Week: 5 days    Minutes of Exercise per Session: 30 min  Stress: No Stress Concern Present (12/01/2022)   [employer] of Occupational Health - Occupational Stress Questionnaire    Feeling of Stress : Not at all  Social Connections: Moderately Isolated (12/01/2022)   Social Connection and Isolation Panel [NHANES]    Frequency of Communication with Friends and Family: More than three times a week    Frequency of Social Gatherings with Friends and Family: Three times a week    Attends Religious Services: More than 4 times per year    Active Member of Clubs or Organizations: No    Attends Banker Meetings: Never    Marital Status: Widowed    Tobacco Counseling Counseling given: Not Answered   Clinical Intake:  Pre-visit preparation completed: Yes  Pain : No/denies pain     Diabetes: No  How often do you need to have someone help you when you read instructions, pamphlets, or other written materials from your doctor or pharmacy?: 1 -  Never  Interpreter Needed?: No  Information entered by :: Remi Haggard LPN   Activities of Daily Living    12/01/2022   10:28 AM  In your present state of health, do you have any difficulty performing the following activities:  Vision? 0  Difficulty concentrating or making decisions? 0  Walking or climbing stairs? 0  Dressing or bathing? 0  Doing errands, shopping? 0  Preparing Food and eating ? N  Using the Toilet? N  In the past six months, have you accidently leaked urine? N  Do you have problems with loss of bowel control? N  Managing your Medications? N  Managing your Finances? N  Housekeeping or managing your Housekeeping? N    Patient Care Team: Shade Flood, MD as PCP - General (Family Medicine) [name], MD as PCP - Cardiology (Cardiology) Marinus Maw, MD as PCP - Electrophysiology (Cardiology) Barron Alvine, MD (Inactive) (Urology) Rachael Fee, MD (Gastroenterology) Judene Companion, MD as Attending Physician (Cardiology) Crist Fat, MD as Attending Physician (Urology) Rene Paci, MD as Consulting Physician (Urology) Axel Filler, Larna Daughters, NP as Nurse Practitioner (Hematology and Oncology) Maryclare Labrador, RN as Registered Nurse  Indicate any recent Medical Services you may have received from other than [facility] providers in the past year (date may be approximate).     Assessment:   This is a routine wellness examination for [place].  Hearing/Vision screen Hearing Screening - Comments:: No trouble hearing Vision Screening - Comments:: Up  to date [facility]   Goals Addressed             This Visit's Progress    Patient Stated   On track    Keep fishing and staying active     Patient Stated       Continue current lifestyle       Depression Screen    12/01/2022   10:31 AM 11/23/2021    3:14 PM 09/01/2021   12:58 PM 02/26/2021   10:52 AM 08/27/2020   11:49 AM 05/14/2020    1:07 PM 02/27/2020   10:03 AM  PHQ 2/9  Scores  PHQ - 2 Score 0 0 0 0 3 0 0  PHQ- 9 Score 2  0 2 6      Fall Risk    12/01/2022   10:22 AM 09/19/2022    2:33 PM 11/23/2021    3:11 PM 09/01/2021   12:58 PM 02/26/2021   10:52 AM  Fall Risk   Falls in the past year? 0 0 0 0 0  Number falls in past yr: 0 0 0 0 0  Injury with Fall? 0 0 0 0 0  Risk for fall due to :  No Fall Risks No Fall Risks No Fall Risks No Fall Risks  Follow up Falls evaluation completed;Education provided;Falls prevention discussed Falls evaluation completed Falls prevention discussed Falls evaluation completed Falls evaluation completed    MEDICARE RISK AT HOME: Medicare Risk at Home Any stairs in or around the home?: Yes If so, are there any without handrails?: No Home free of loose throw rugs in walkways, pet beds, electrical cords, etc?: Yes Adequate lighting in your home to reduce risk of falls?: Yes Life alert?: No Use of a cane, walker or w/c?: No Grab bars in the bathroom?: No Shower chair or bench in shower?: Yes Elevated toilet seat or a handicapped toilet?: No  TIMED UP AND GO:  Was the test performed?  No    Cognitive Function:        12/01/2022   10:33 AM 11/23/2021    3:18 PM 08/08/2019    9:10 AM 08/08/2019    9:08 AM 07/24/2018   11:14 AM  6CIT Screen  What Year? 0 points 0 points 0 points 0 points 0 points  What month? 0 points 0 points 0 points 0 points 0 points  What time? 0 points 0 points 0 points 0 points 0 points  Count back from 20 0 points 0 points 0 points 0 points 0 points  Months in reverse 2 points 0 points 4 points 0 points 0 points  Repeat phrase 4 points 2 points 2 points 0 points 0 points  Total Score 6 points 2 points 6 points 0 points 0 points    Immunizations Immunization History  Administered Date(s) Administered   COVID-19, mRNA, vaccine(Comirnaty)12 years and older 01/03/2022   Fluad Quad(high Dose 65+) 12/24/2021   Hepatitis B 01/14/2011   Influenza Split 01/14/2011, 01/18/2012   Influenza, High Dose  Seasonal PF 01/10/2018, 12/19/2018   Influenza,inj,Quad PF,6+ Mos 02/04/2013, 12/26/2013, 01/23/2015, 01/22/2016, 12/16/2016   Influenza-Unspecified 12/27/2019   PFIZER(Purple Top)SARS-COV-2 Vaccination 04/30/2019, 05/22/2019, 12/31/2019, 07/22/2020   Pneumococcal Conjugate-13 05/06/2014   Pneumococcal Polysaccharide-23 03/28/2004, 04/29/2009   Tdap 08/11/2009, 06/04/2021   Zoster Recombinant(Shingrix) 06/02/2021   Zoster, Live 06/11/2013    TDAP status: Up to date  Flu Vaccine status: Due, Education has been provided regarding the importance of this vaccine. Advised may receive this vaccine at local  pharmacy or Health Dept. Aware to provide a copy of the vaccination record if obtained from local pharmacy or Health Dept. Verbalized acceptance and understanding.  Pneumococcal vaccine status: Up to date  Covid-19 vaccine status: Information provided on how to obtain vaccines.   Qualifies for Shingles Vaccine? Yes   Zostavax completed No   Shingrix Completed?: No.    Education has been provided regarding the importance of this vaccine. Patient has been advised to call insurance company to determine out of pocket expense if they have not yet received this vaccine. Advised may also receive vaccine at local pharmacy or Health Dept. Verbalized acceptance and understanding.  Screening Tests Health Maintenance  Topic Date Due   Zoster Vaccines- Shingrix (2 of 2) 07/28/2021   INFLUENZA VACCINE  10/27/2022   COVID-19 Vaccine (6 - 2023-24 season) 11/27/2022   Medicare Annual Wellness (AWV)  12/01/2023   Colonoscopy  03/15/2027   DTaP/Tdap/Td (3 - Td or Tdap) 06/05/2031   Pneumonia Vaccine 73+ Years old  Completed   Hepatitis C Screening  Completed   HPV VACCINES  Aged Out    Health Maintenance  Health Maintenance Due  Topic Date Due   Zoster Vaccines- Shingrix (2 of 2) 07/28/2021   INFLUENZA VACCINE  10/27/2022   COVID-19 Vaccine (6 - 2023-24 season) 11/27/2022    Colorectal cancer  screening: No longer required.   Lung Cancer Screening: (Low Dose CT Chest recommended if Age 4-80 years, 20 pack-year currently smoking OR have quit w/in 15years.) does not qualify.   Lung Cancer Screening Referral:   Additional Screening:  Hepatitis C Screening: does not qualify; Completed 2018  Vision Screening: Recommended annual ophthalmology exams for early detection of glaucoma and other disorders of the eye. Is the patient up to date with their annual eye exam?  Yes  Who is the provider or what is the name of the office in which the patient attends annual eye exams? [facility] If pt is not established with a provider, would they like to be referred to a provider to establish care? No .   Dental Screening: Recommended annual dental exams for proper oral hygiene  Community Resource Referral / Chronic Care Management: CRR required this visit?  No   CCM required this visit?  No     Plan:     I have personally reviewed and noted the following in the patient's chart:   Medical and social history Use of alcohol, tobacco or illicit drugs  Current medications and supplements including opioid prescriptions. Patient is not currently taking opioid prescriptions. Functional ability and status Nutritional status Physical activity Advanced directives List of other physicians Hospitalizations, surgeries, and ER visits in previous 12 months Vitals Screenings to include cognitive, depression, and falls Referrals and appointments  In addition, I have reviewed and discussed with patient certain preventive protocols, quality metrics, and best practice recommendations. A written personalized care plan for preventive services as well as general preventive health recommendations were provided to patient.     Remi Haggard, LPN   08/30/7844   After Visit Summary: (MyChart) Due to this being a telephonic visit, the after visit summary with patients personalized plan was offered to patient via  MyChart   Nurse Notes:

## 2022-12-01 NOTE — Patient Instructions (Signed)
[name] [name] , Thank you for taking time to come for your Medicare Wellness Visit. I appreciate your ongoing commitment to your health goals. Please review the following plan we discussed and let me know if I can assist you in the future.   Screening recommendations/referrals: Colonoscopy: no longer required Recommended yearly ophthalmology/optometry visit for glaucoma screening and checkup Recommended yearly dental visit for hygiene and checkup  Vaccinations: Influenza vaccine: Education provided Pneumococcal vaccine: up to date Tdap vaccine: up to date Shingles vaccine: Education provided 1 of 2 on file       Preventive Care 65 Years and Older, Male Preventive care refers to lifestyle choices and visits with your health care provider that can promote health and wellness. What does preventive care include? A yearly physical exam. This is also called an annual well check. Dental exams once or twice a year. Routine eye exams. Ask your health care provider how often you should have your eyes checked. Personal lifestyle choices, including: Daily care of your teeth and gums. Regular physical activity. Eating a healthy diet. Avoiding tobacco and drug use. Limiting alcohol use. Practicing safe sex. Taking low doses of aspirin every day. Taking vitamin and mineral supplements as recommended by your health care provider. What happens during an annual well check? The services and screenings done by your health care provider during your annual well check will depend on your age, overall health, lifestyle risk factors, and family history of disease. Counseling  Your health care provider may ask you questions about your: Alcohol use. Tobacco use. Drug use. Emotional well-being. Home and relationship well-being. Sexual activity. Eating habits. History of falls. Memory and ability to understand (cognition). Work and work Astronomer. Screening  You may have the following tests or  measurements: Height, weight, and BMI. Blood pressure. Lipid and cholesterol levels. These may be checked every 5 years, or more frequently if you are over 67 years old. Skin check. Lung cancer screening. You may have this screening every year starting at age 78 if you have a 30-pack-year history of smoking and currently smoke or have quit within the past 15 years. Fecal occult blood test (FOBT) of the stool. You may have this test every year starting at age 52. Flexible sigmoidoscopy or colonoscopy. You may have a sigmoidoscopy every 5 years or a colonoscopy every 10 years starting at age 21. Prostate cancer screening. Recommendations will vary depending on your family history and other risks. Hepatitis C blood test. Hepatitis B blood test. Sexually transmitted disease (STD) testing. Diabetes screening. This is done by checking your blood sugar (glucose) after you have not eaten for a while (fasting). You may have this done every 1-3 years. Abdominal aortic aneurysm (AAA) screening. You may need this if you are a current or former smoker. Osteoporosis. You may be screened starting at age 15 if you are at high risk. Talk with your health care provider about your test results, treatment options, and if necessary, the need for more tests. Vaccines  Your health care provider may recommend certain vaccines, such as: Influenza vaccine. This is recommended every year. Tetanus, diphtheria, and acellular pertussis (Tdap, Td) vaccine. You may need a Td booster every 10 years. Zoster vaccine. You may need this after age 36. Pneumococcal 13-valent conjugate (PCV13) vaccine. One dose is recommended after age 36. Pneumococcal polysaccharide (PPSV23) vaccine. One dose is recommended after age 32. Talk to your health care provider about which screenings and vaccines you need and how often you need them. This  information is not intended to replace advice given to you by your health care provider. Make sure  you discuss any questions you have with your health care provider. Document Released: 04/10/2015 Document Revised: 12/02/2015 Document Reviewed: 01/13/2015 Elsevier Interactive Patient Education  2017 [employer].  Fall Prevention in the Home Falls can cause injuries. They can happen to people of all ages. There are many things you can do to make your home safe and to help prevent falls. What can I do on the outside of my home? Regularly fix the edges of walkways and driveways and fix any cracks. Remove anything that might make you trip as you walk through a door, such as a raised step or threshold. Trim any bushes or trees on the path to your home. Use bright outdoor lighting. Clear any walking paths of anything that might make someone trip, such as rocks or tools. Regularly check to see if handrails are loose or broken. Make sure that both sides of any steps have handrails. Any raised decks and porches should have guardrails on the edges. Have any leaves, snow, or ice cleared regularly. Use sand or salt on walking paths during winter. Clean up any spills in your garage right away. This includes oil or grease spills. What can I do in the bathroom? Use night lights. Install grab bars by the toilet and in the tub and shower. Do not use towel bars as grab bars. Use non-skid mats or decals in the tub or shower. If you need to sit down in the shower, use a plastic, non-slip stool. Keep the floor dry. Clean up any water that spills on the floor as soon as it happens. Remove soap buildup in the tub or shower regularly. Attach bath mats securely with double-sided non-slip rug tape. Do not have throw rugs and other things on the floor that can make you trip. What can I do in the bedroom? Use night lights. Make sure that you have a light by your bed that is easy to reach. Do not use any sheets or blankets that are too big for your bed. They should not hang down onto the floor. Have a firm  chair that has side arms. You can use this for support while you get dressed. Do not have throw rugs and other things on the floor that can make you trip. What can I do in the kitchen? Clean up any spills right away. Avoid walking on wet floors. Keep items that you use a lot in easy-to-reach places. If you need to reach something above you, use a strong step stool that has a grab bar. Keep electrical cords out of the way. Do not use floor polish or wax that makes floors slippery. If you must use wax, use non-skid floor wax. Do not have throw rugs and other things on the floor that can make you trip. What can I do with my stairs? Do not leave any items on the stairs. Make sure that there are handrails on both sides of the stairs and use them. Fix handrails that are broken or loose. Make sure that handrails are as long as the stairways. Check any carpeting to make sure that it is firmly attached to the stairs. Fix any carpet that is loose or worn. Avoid having throw rugs at the top or bottom of the stairs. If you do have throw rugs, attach them to the floor with carpet tape. Make sure that you have a light switch at the top  of the stairs and the bottom of the stairs. If you do not have them, ask someone to add them for you. What else can I do to help prevent falls? Wear shoes that: Do not have high heels. Have rubber bottoms. Are comfortable and fit you well. Are closed at the toe. Do not wear sandals. If you use a stepladder: Make sure that it is fully opened. Do not climb a closed stepladder. Make sure that both sides of the stepladder are locked into place. Ask someone to hold it for you, if possible. Clearly mark and make sure that you can see: Any grab bars or handrails. First and last steps. Where the edge of each step is. Use tools that help you move around (mobility aids) if they are needed. These include: Canes. Walkers. Scooters. Crutches. Turn on the lights when you go  into a dark area. Replace any light bulbs as soon as they burn out. Set up your furniture so you have a clear path. Avoid moving your furniture around. If any of your floors are uneven, fix them. If there are any pets around you, be aware of where they are. Review your medicines with your doctor. Some medicines can make you feel dizzy. This can increase your chance of falling. Ask your doctor what other things that you can do to help prevent falls. This information is not intended to replace advice given to you by your health care provider. Make sure you discuss any questions you have with your health care provider. Document Released: 01/08/2009 Document Revised: 08/20/2015 Document Reviewed: 04/18/2014 Elsevier Interactive Patient Education  2017 [employer].

## 2022-12-27 ENCOUNTER — Other Ambulatory Visit: Payer: Self-pay | Admitting: Family Medicine

## 2022-12-27 DIAGNOSIS — H02834 Dermatochalasis of left upper eyelid: Secondary | ICD-10-CM | POA: Diagnosis not present

## 2022-12-27 DIAGNOSIS — D3132 Benign neoplasm of left choroid: Secondary | ICD-10-CM | POA: Diagnosis not present

## 2022-12-27 DIAGNOSIS — H43812 Vitreous degeneration, left eye: Secondary | ICD-10-CM | POA: Diagnosis not present

## 2022-12-27 DIAGNOSIS — H2513 Age-related nuclear cataract, bilateral: Secondary | ICD-10-CM | POA: Diagnosis not present

## 2022-12-27 DIAGNOSIS — H47011 Ischemic optic neuropathy, right eye: Secondary | ICD-10-CM | POA: Diagnosis not present

## 2022-12-27 DIAGNOSIS — H02831 Dermatochalasis of right upper eyelid: Secondary | ICD-10-CM | POA: Diagnosis not present

## 2023-01-01 NOTE — Progress Notes (Unsigned)
Cardiology Office Note    Date:  01/02/2023  ID:  [name] [name] 02-24-1946, MRN [id number] PCP:  Shade Flood, MD  Cardiologist:  Bryan Lemma, MD  Electrophysiologist:  [name], MD   Chief Complaint: Follow up for HFrEF, nonischemic cardiomyopathy  History of Present Illness: .    [name] [name] is a 77 y.o. male with visit-pertinent history of nonischemic cardiomyopathy, LBBB, chronic CHF with reduced ejection fraction, s/p BIV ICD, PAF, hypertriglyceridemia, DOE, obesity, hyperlipidemia, neck pain s/p cervical spine fusion and claudication.  In 05/2020 [name] [name] underwent echocardiogram which showed severely reduced LVEF at less than 20%.  His ventricle was noted to be dilated and not relaxing well.  He underwent cardiac catheterization in 05/2020 which showed left dominant coronary with widely patent coronary arteries, severe left ventricular systolic dysfunction and moderately elevated LVEDP consistent with chronic systolic CHF.  He was also noted to have mild pulmonary hypertension with a mean PA pressure of 24 mmHg.  In 10/2021 he underwent BiV ICD placement, following his discharge he underwent revision of his RV lead which showed microperforation, cardiac tamponade which required placement of pericardial drain and he was started on colchicine.  During hospitalization he developed rapid atrial fibrillation and was started on amiodarone, per notes he has no had recurrence.   Escalation of GDMT has been limited by low blood pressures as well as history of angioedema on ACE inhibitors per Dr. Herbie Baltimore he has not to start on ARB or Arni's.  Echo 09/20/2022 indicated an LVEF of 20 to 25%, LV severely decreased function, LV with global hypokinesis, LV internal cavity size was moderately dilated, mild concentric LV hypertrophy, G1 DD and elevated left ventricular end-diastolic pressure.  There was normal pulmonary artery systolic pressure.  Trivial MR, moderate calcification of  the aortic valve with no stenosis present.   Last seen by Dr. Herbie Baltimore on 09/26/2022, patient still noted exertional dyspnea and fatigue, denied PND, orthopnea or edema.  Given ongoing fatigue it was agreed that Coreg would be slowly decreased until discontinuation with increase of hydralazine.  Today he presents for follow up. He reports he is doing well. He still notes ongoing dyspnea on exertion. He denies lower extremity swelling, orthopnea or pnd. Following discontinuation of Coreg and rosuvastatin he has not noticed a difference in how he is feeling.   Labwork independently reviewed: 09/07/2022: Sodium 137, potassium 4.5, creatinine 1.16, hemoglobin 12.2, hematocrit 37.3  ROS: .   Today he denies chest pain, lower extremity edema, palpitations, melena, hematuria, hemoptysis, diaphoresis, weakness, presyncope, syncope, orthopnea, and PND.  All other systems are reviewed and otherwise negative. Studies Reviewed: Marland Kitchen    EKG:  EKG is ordered today, personally reviewed, demonstrating  EKG Interpretation Date/Time:  Monday January 02 2023 14:12:47 EDT Ventricular Rate:  76 PR Interval:  142 QRS Duration:  138 QT Interval:  430 QTC Calculation: 483 R Axis:   18  Text Interpretation: Atrial-sensed ventricular-paced rhythm Confirmed by Reather Littler [id number]) on 01/02/2023 2:19:46 PM   CV Studies:  Cardiac Studies & Procedures   CARDIAC CATHETERIZATION  CARDIAC CATHETERIZATION 10/28/2021  Narrative Please see op note dictated separately.  Patient underwent needle pericardiocentesis to treat hemopericardium with tamponade and underwent insertion of a right internal jugular central venous line without immediate complication.   CARDIAC CATHETERIZATION 10/28/2021  Narrative SURGEON: Loman Brooklyn, MD  PREPROCEDURE DIAGNOSES: 1. Nonischemic cardiomyopathy. 2. New York Heart Association class III, heart failure chronically. 3. Left bundle-branch block.  POSTPROCEDURE DIAGNOSES:  [id number]          Weight:       191.2 lb Date of Birth:  February 15, 1946            BSA:          2.005 m Patient Age:    77 years            BP:           108/62 mmHg Patient Gender: M                   HR:            61 bpm. Exam Location:  [address]  Procedure: 2D Echo, Cardiac Doppler and Color Doppler  Indications:    I48.91 Atrial fibrillation  History:        Patient has prior history of Echocardiogram examinations, most recent 10/06/2020. NICM and CHF, Defibrillator, Arrythmias:Atrial Fibrillation and LBBB; Risk Factors:Obesity, Former Smoker and Dyslipidemia.  Sonographer:    Samule Ohm RDCS Referring Phys: 53 [name] [name]  IMPRESSIONS   1. Left ventricular ejection fraction, by estimation, is 20 to 25%. The left ventricle has severely decreased function. The left ventricle demonstrates global hypokinesis. The left ventricular internal cavity size was moderately dilated. There is mild concentric left ventricular hypertrophy. Left ventricular diastolic parameters are consistent with Grade I diastolic dysfunction (impaired relaxation). Elevated left ventricular end-diastolic pressure. 2. Right ventricular systolic function is normal. The right ventricular size is normal. There is normal pulmonary artery systolic pressure. 3. Left atrial size was mildly dilated. 4. The mitral valve is normal in structure. Trivial mitral valve regurgitation. No evidence of mitral stenosis. 5. Moderate calcification of the non-coronary cusp. The aortic valve is normal in structure. There is moderate calcification of the aortic valve. There is moderate thickening of the aortic valve. Aortic valve regurgitation is not visualized. No aortic stenosis is present. 6. The inferior vena cava is normal in size with greater than 50% respiratory variability, suggesting right atrial pressure of 3 mmHg.  FINDINGS Left Ventricle: Left ventricular ejection fraction, by estimation, is 20 to 25%. The left ventricle has severely decreased function. The left ventricle demonstrates global hypokinesis. The left ventricular internal cavity size was moderately dilated. There is mild concentric left ventricular  hypertrophy. Abnormal (paradoxical) septal motion, consistent with RV pacemaker. Left ventricular diastolic parameters are consistent with Grade I diastolic dysfunction (impaired relaxation). Elevated left ventricular end-diastolic pressure.  Right Ventricle: The right ventricular size is normal. No increase in right ventricular wall thickness. Right ventricular systolic function is normal. There is normal pulmonary artery systolic pressure. The tricuspid regurgitant velocity is 2.17 m/s, and with an assumed right atrial pressure of 3 mmHg, the estimated right ventricular systolic pressure is 21.8 mmHg.  Left Atrium: Left atrial size was mildly dilated.  Right Atrium: Right atrial size was normal in size.  Pericardium: There is no evidence of pericardial effusion.  Mitral Valve: The mitral valve is normal in structure. Trivial mitral valve regurgitation. No evidence of mitral valve stenosis.  Tricuspid Valve: The tricuspid valve is normal in structure. Tricuspid valve regurgitation is trivial. No evidence of tricuspid stenosis.  Aortic Valve: Moderate calcification of the non-coronary cusp. The aortic valve is normal in structure. There is moderate calcification of the aortic valve. There is moderate thickening of the aortic valve. Aortic valve regurgitation is not visualized. No aortic stenosis is present.  Pulmonic Valve: The pulmonic valve was normal in structure. Pulmonic valve regurgitation  [id number]          Weight:       191.2 lb Date of Birth:  February 15, 1946            BSA:          2.005 m Patient Age:    77 years            BP:           108/62 mmHg Patient Gender: M                   HR:            61 bpm. Exam Location:  [address]  Procedure: 2D Echo, Cardiac Doppler and Color Doppler  Indications:    I48.91 Atrial fibrillation  History:        Patient has prior history of Echocardiogram examinations, most recent 10/06/2020. NICM and CHF, Defibrillator, Arrythmias:Atrial Fibrillation and LBBB; Risk Factors:Obesity, Former Smoker and Dyslipidemia.  Sonographer:    Samule Ohm RDCS Referring Phys: 53 [name] [name]  IMPRESSIONS   1. Left ventricular ejection fraction, by estimation, is 20 to 25%. The left ventricle has severely decreased function. The left ventricle demonstrates global hypokinesis. The left ventricular internal cavity size was moderately dilated. There is mild concentric left ventricular hypertrophy. Left ventricular diastolic parameters are consistent with Grade I diastolic dysfunction (impaired relaxation). Elevated left ventricular end-diastolic pressure. 2. Right ventricular systolic function is normal. The right ventricular size is normal. There is normal pulmonary artery systolic pressure. 3. Left atrial size was mildly dilated. 4. The mitral valve is normal in structure. Trivial mitral valve regurgitation. No evidence of mitral stenosis. 5. Moderate calcification of the non-coronary cusp. The aortic valve is normal in structure. There is moderate calcification of the aortic valve. There is moderate thickening of the aortic valve. Aortic valve regurgitation is not visualized. No aortic stenosis is present. 6. The inferior vena cava is normal in size with greater than 50% respiratory variability, suggesting right atrial pressure of 3 mmHg.  FINDINGS Left Ventricle: Left ventricular ejection fraction, by estimation, is 20 to 25%. The left ventricle has severely decreased function. The left ventricle demonstrates global hypokinesis. The left ventricular internal cavity size was moderately dilated. There is mild concentric left ventricular  hypertrophy. Abnormal (paradoxical) septal motion, consistent with RV pacemaker. Left ventricular diastolic parameters are consistent with Grade I diastolic dysfunction (impaired relaxation). Elevated left ventricular end-diastolic pressure.  Right Ventricle: The right ventricular size is normal. No increase in right ventricular wall thickness. Right ventricular systolic function is normal. There is normal pulmonary artery systolic pressure. The tricuspid regurgitant velocity is 2.17 m/s, and with an assumed right atrial pressure of 3 mmHg, the estimated right ventricular systolic pressure is 21.8 mmHg.  Left Atrium: Left atrial size was mildly dilated.  Right Atrium: Right atrial size was normal in size.  Pericardium: There is no evidence of pericardial effusion.  Mitral Valve: The mitral valve is normal in structure. Trivial mitral valve regurgitation. No evidence of mitral valve stenosis.  Tricuspid Valve: The tricuspid valve is normal in structure. Tricuspid valve regurgitation is trivial. No evidence of tricuspid stenosis.  Aortic Valve: Moderate calcification of the non-coronary cusp. The aortic valve is normal in structure. There is moderate calcification of the aortic valve. There is moderate thickening of the aortic valve. Aortic valve regurgitation is not visualized. No aortic stenosis is present.  Pulmonic Valve: The pulmonic valve was normal in structure. Pulmonic valve regurgitation  Cardiology Office Note    Date:  01/02/2023  ID:  [name] [name] 02-24-1946, MRN [id number] PCP:  Shade Flood, MD  Cardiologist:  Bryan Lemma, MD  Electrophysiologist:  [name], MD   Chief Complaint: Follow up for HFrEF, nonischemic cardiomyopathy  History of Present Illness: .    [name] [name] is a 77 y.o. male with visit-pertinent history of nonischemic cardiomyopathy, LBBB, chronic CHF with reduced ejection fraction, s/p BIV ICD, PAF, hypertriglyceridemia, DOE, obesity, hyperlipidemia, neck pain s/p cervical spine fusion and claudication.  In 05/2020 [name] [name] underwent echocardiogram which showed severely reduced LVEF at less than 20%.  His ventricle was noted to be dilated and not relaxing well.  He underwent cardiac catheterization in 05/2020 which showed left dominant coronary with widely patent coronary arteries, severe left ventricular systolic dysfunction and moderately elevated LVEDP consistent with chronic systolic CHF.  He was also noted to have mild pulmonary hypertension with a mean PA pressure of 24 mmHg.  In 10/2021 he underwent BiV ICD placement, following his discharge he underwent revision of his RV lead which showed microperforation, cardiac tamponade which required placement of pericardial drain and he was started on colchicine.  During hospitalization he developed rapid atrial fibrillation and was started on amiodarone, per notes he has no had recurrence.   Escalation of GDMT has been limited by low blood pressures as well as history of angioedema on ACE inhibitors per Dr. Herbie Baltimore he has not to start on ARB or Arni's.  Echo 09/20/2022 indicated an LVEF of 20 to 25%, LV severely decreased function, LV with global hypokinesis, LV internal cavity size was moderately dilated, mild concentric LV hypertrophy, G1 DD and elevated left ventricular end-diastolic pressure.  There was normal pulmonary artery systolic pressure.  Trivial MR, moderate calcification of  the aortic valve with no stenosis present.   Last seen by Dr. Herbie Baltimore on 09/26/2022, patient still noted exertional dyspnea and fatigue, denied PND, orthopnea or edema.  Given ongoing fatigue it was agreed that Coreg would be slowly decreased until discontinuation with increase of hydralazine.  Today he presents for follow up. He reports he is doing well. He still notes ongoing dyspnea on exertion. He denies lower extremity swelling, orthopnea or pnd. Following discontinuation of Coreg and rosuvastatin he has not noticed a difference in how he is feeling.   Labwork independently reviewed: 09/07/2022: Sodium 137, potassium 4.5, creatinine 1.16, hemoglobin 12.2, hematocrit 37.3  ROS: .   Today he denies chest pain, lower extremity edema, palpitations, melena, hematuria, hemoptysis, diaphoresis, weakness, presyncope, syncope, orthopnea, and PND.  All other systems are reviewed and otherwise negative. Studies Reviewed: Marland Kitchen    EKG:  EKG is ordered today, personally reviewed, demonstrating  EKG Interpretation Date/Time:  Monday January 02 2023 14:12:47 EDT Ventricular Rate:  76 PR Interval:  142 QRS Duration:  138 QT Interval:  430 QTC Calculation: 483 R Axis:   18  Text Interpretation: Atrial-sensed ventricular-paced rhythm Confirmed by Reather Littler [id number]) on 01/02/2023 2:19:46 PM   CV Studies:  Cardiac Studies & Procedures   CARDIAC CATHETERIZATION  CARDIAC CATHETERIZATION 10/28/2021  Narrative Please see op note dictated separately.  Patient underwent needle pericardiocentesis to treat hemopericardium with tamponade and underwent insertion of a right internal jugular central venous line without immediate complication.   CARDIAC CATHETERIZATION 10/28/2021  Narrative SURGEON: Loman Brooklyn, MD  PREPROCEDURE DIAGNOSES: 1. Nonischemic cardiomyopathy. 2. New York Heart Association class III, heart failure chronically. 3. Left bundle-branch block.  POSTPROCEDURE DIAGNOSES:  Cardiology Office Note    Date:  01/02/2023  ID:  [name] [name] 02-24-1946, MRN [id number] PCP:  Shade Flood, MD  Cardiologist:  Bryan Lemma, MD  Electrophysiologist:  [name], MD   Chief Complaint: Follow up for HFrEF, nonischemic cardiomyopathy  History of Present Illness: .    [name] [name] is a 77 y.o. male with visit-pertinent history of nonischemic cardiomyopathy, LBBB, chronic CHF with reduced ejection fraction, s/p BIV ICD, PAF, hypertriglyceridemia, DOE, obesity, hyperlipidemia, neck pain s/p cervical spine fusion and claudication.  In 05/2020 [name] [name] underwent echocardiogram which showed severely reduced LVEF at less than 20%.  His ventricle was noted to be dilated and not relaxing well.  He underwent cardiac catheterization in 05/2020 which showed left dominant coronary with widely patent coronary arteries, severe left ventricular systolic dysfunction and moderately elevated LVEDP consistent with chronic systolic CHF.  He was also noted to have mild pulmonary hypertension with a mean PA pressure of 24 mmHg.  In 10/2021 he underwent BiV ICD placement, following his discharge he underwent revision of his RV lead which showed microperforation, cardiac tamponade which required placement of pericardial drain and he was started on colchicine.  During hospitalization he developed rapid atrial fibrillation and was started on amiodarone, per notes he has no had recurrence.   Escalation of GDMT has been limited by low blood pressures as well as history of angioedema on ACE inhibitors per Dr. Herbie Baltimore he has not to start on ARB or Arni's.  Echo 09/20/2022 indicated an LVEF of 20 to 25%, LV severely decreased function, LV with global hypokinesis, LV internal cavity size was moderately dilated, mild concentric LV hypertrophy, G1 DD and elevated left ventricular end-diastolic pressure.  There was normal pulmonary artery systolic pressure.  Trivial MR, moderate calcification of  the aortic valve with no stenosis present.   Last seen by Dr. Herbie Baltimore on 09/26/2022, patient still noted exertional dyspnea and fatigue, denied PND, orthopnea or edema.  Given ongoing fatigue it was agreed that Coreg would be slowly decreased until discontinuation with increase of hydralazine.  Today he presents for follow up. He reports he is doing well. He still notes ongoing dyspnea on exertion. He denies lower extremity swelling, orthopnea or pnd. Following discontinuation of Coreg and rosuvastatin he has not noticed a difference in how he is feeling.   Labwork independently reviewed: 09/07/2022: Sodium 137, potassium 4.5, creatinine 1.16, hemoglobin 12.2, hematocrit 37.3  ROS: .   Today he denies chest pain, lower extremity edema, palpitations, melena, hematuria, hemoptysis, diaphoresis, weakness, presyncope, syncope, orthopnea, and PND.  All other systems are reviewed and otherwise negative. Studies Reviewed: Marland Kitchen    EKG:  EKG is ordered today, personally reviewed, demonstrating  EKG Interpretation Date/Time:  Monday January 02 2023 14:12:47 EDT Ventricular Rate:  76 PR Interval:  142 QRS Duration:  138 QT Interval:  430 QTC Calculation: 483 R Axis:   18  Text Interpretation: Atrial-sensed ventricular-paced rhythm Confirmed by Reather Littler [id number]) on 01/02/2023 2:19:46 PM   CV Studies:  Cardiac Studies & Procedures   CARDIAC CATHETERIZATION  CARDIAC CATHETERIZATION 10/28/2021  Narrative Please see op note dictated separately.  Patient underwent needle pericardiocentesis to treat hemopericardium with tamponade and underwent insertion of a right internal jugular central venous line without immediate complication.   CARDIAC CATHETERIZATION 10/28/2021  Narrative SURGEON: Loman Brooklyn, MD  PREPROCEDURE DIAGNOSES: 1. Nonischemic cardiomyopathy. 2. New York Heart Association class III, heart failure chronically. 3. Left bundle-branch block.  POSTPROCEDURE DIAGNOSES:  Cardiology Office Note    Date:  01/02/2023  ID:  [name] [name] 02-24-1946, MRN [id number] PCP:  Shade Flood, MD  Cardiologist:  Bryan Lemma, MD  Electrophysiologist:  [name], MD   Chief Complaint: Follow up for HFrEF, nonischemic cardiomyopathy  History of Present Illness: .    [name] [name] is a 77 y.o. male with visit-pertinent history of nonischemic cardiomyopathy, LBBB, chronic CHF with reduced ejection fraction, s/p BIV ICD, PAF, hypertriglyceridemia, DOE, obesity, hyperlipidemia, neck pain s/p cervical spine fusion and claudication.  In 05/2020 [name] [name] underwent echocardiogram which showed severely reduced LVEF at less than 20%.  His ventricle was noted to be dilated and not relaxing well.  He underwent cardiac catheterization in 05/2020 which showed left dominant coronary with widely patent coronary arteries, severe left ventricular systolic dysfunction and moderately elevated LVEDP consistent with chronic systolic CHF.  He was also noted to have mild pulmonary hypertension with a mean PA pressure of 24 mmHg.  In 10/2021 he underwent BiV ICD placement, following his discharge he underwent revision of his RV lead which showed microperforation, cardiac tamponade which required placement of pericardial drain and he was started on colchicine.  During hospitalization he developed rapid atrial fibrillation and was started on amiodarone, per notes he has no had recurrence.   Escalation of GDMT has been limited by low blood pressures as well as history of angioedema on ACE inhibitors per Dr. Herbie Baltimore he has not to start on ARB or Arni's.  Echo 09/20/2022 indicated an LVEF of 20 to 25%, LV severely decreased function, LV with global hypokinesis, LV internal cavity size was moderately dilated, mild concentric LV hypertrophy, G1 DD and elevated left ventricular end-diastolic pressure.  There was normal pulmonary artery systolic pressure.  Trivial MR, moderate calcification of  the aortic valve with no stenosis present.   Last seen by Dr. Herbie Baltimore on 09/26/2022, patient still noted exertional dyspnea and fatigue, denied PND, orthopnea or edema.  Given ongoing fatigue it was agreed that Coreg would be slowly decreased until discontinuation with increase of hydralazine.  Today he presents for follow up. He reports he is doing well. He still notes ongoing dyspnea on exertion. He denies lower extremity swelling, orthopnea or pnd. Following discontinuation of Coreg and rosuvastatin he has not noticed a difference in how he is feeling.   Labwork independently reviewed: 09/07/2022: Sodium 137, potassium 4.5, creatinine 1.16, hemoglobin 12.2, hematocrit 37.3  ROS: .   Today he denies chest pain, lower extremity edema, palpitations, melena, hematuria, hemoptysis, diaphoresis, weakness, presyncope, syncope, orthopnea, and PND.  All other systems are reviewed and otherwise negative. Studies Reviewed: Marland Kitchen    EKG:  EKG is ordered today, personally reviewed, demonstrating  EKG Interpretation Date/Time:  Monday January 02 2023 14:12:47 EDT Ventricular Rate:  76 PR Interval:  142 QRS Duration:  138 QT Interval:  430 QTC Calculation: 483 R Axis:   18  Text Interpretation: Atrial-sensed ventricular-paced rhythm Confirmed by Reather Littler [id number]) on 01/02/2023 2:19:46 PM   CV Studies:  Cardiac Studies & Procedures   CARDIAC CATHETERIZATION  CARDIAC CATHETERIZATION 10/28/2021  Narrative Please see op note dictated separately.  Patient underwent needle pericardiocentesis to treat hemopericardium with tamponade and underwent insertion of a right internal jugular central venous line without immediate complication.   CARDIAC CATHETERIZATION 10/28/2021  Narrative SURGEON: Loman Brooklyn, MD  PREPROCEDURE DIAGNOSES: 1. Nonischemic cardiomyopathy. 2. New York Heart Association class III, heart failure chronically. 3. Left bundle-branch block.  POSTPROCEDURE DIAGNOSES:

## 2023-01-02 ENCOUNTER — Encounter: Payer: Self-pay | Admitting: Nurse Practitioner

## 2023-01-02 ENCOUNTER — Ambulatory Visit: Payer: Medicare HMO | Attending: Nurse Practitioner | Admitting: Cardiology

## 2023-01-02 ENCOUNTER — Other Ambulatory Visit: Payer: Self-pay

## 2023-01-02 VITALS — BP 120/70 | HR 76 | Ht 68.0 in | Wt 194.0 lb

## 2023-01-02 DIAGNOSIS — R5382 Chronic fatigue, unspecified: Secondary | ICD-10-CM | POA: Diagnosis not present

## 2023-01-02 DIAGNOSIS — E785 Hyperlipidemia, unspecified: Secondary | ICD-10-CM

## 2023-01-02 DIAGNOSIS — I4891 Unspecified atrial fibrillation: Secondary | ICD-10-CM

## 2023-01-02 DIAGNOSIS — I428 Other cardiomyopathies: Secondary | ICD-10-CM

## 2023-01-02 DIAGNOSIS — I5022 Chronic systolic (congestive) heart failure: Secondary | ICD-10-CM

## 2023-01-02 NOTE — Progress Notes (Signed)
Order(s) created erroneously. Erroneous order ID: [id number]  Order moved by: Leonia Corona  Order move date/time: 01/02/2023 5:49 PM  Source Patient: [id number]  Source Contact: 01/02/2023  Destination Patient: [id number]  Destination Contact: 11/23/2022

## 2023-01-02 NOTE — Patient Instructions (Signed)
Medication Instructions:  Your physician recommends that you continue on your current medications as directed. Please refer to the Current Medication list given to you today.  *If you need a refill on your cardiac medications before your next appointment, please call your pharmacy*   Lab Work: Fasting lipid panel in 2-3 weeks.   Testing/Procedures: NONE ordered at this time of appointment     Follow-Up: At [facility], you and your health needs are our priority.  As part of our continuing mission to provide you with exceptional heart care, we have created designated Provider Care Teams.  These Care Teams include your primary Cardiologist (physician) and Advanced Practice Providers (APPs -  Physician Assistants and Nurse Practitioners) who all work together to provide you with the care you need, when you need it.  We recommend signing up for the patient portal called "MyChart".  Sign up information is provided on this After Visit Summary.  MyChart is used to connect with patients for Virtual Visits (Telemedicine).  Patients are able to view lab/test results, encounter notes, upcoming appointments, etc.  Non-urgent messages can be sent to your provider as well.   To learn more about what you can do with MyChart, go to [email or web address].    Your next appointment:   2-3 month(s)  Provider:   Reather Littler, NP

## 2023-01-20 DIAGNOSIS — C61 Malignant neoplasm of prostate: Secondary | ICD-10-CM | POA: Diagnosis not present

## 2023-01-20 DIAGNOSIS — E785 Hyperlipidemia, unspecified: Secondary | ICD-10-CM | POA: Diagnosis not present

## 2023-01-20 DIAGNOSIS — I5022 Chronic systolic (congestive) heart failure: Secondary | ICD-10-CM | POA: Diagnosis not present

## 2023-01-20 DIAGNOSIS — I428 Other cardiomyopathies: Secondary | ICD-10-CM | POA: Diagnosis not present

## 2023-01-20 DIAGNOSIS — R5382 Chronic fatigue, unspecified: Secondary | ICD-10-CM | POA: Diagnosis not present

## 2023-01-20 DIAGNOSIS — I4891 Unspecified atrial fibrillation: Secondary | ICD-10-CM | POA: Diagnosis not present

## 2023-01-21 LAB — LIPID PANEL
Chol/HDL Ratio: 5.7 {ratio} — ABNORMAL HIGH (ref 0.0–5.0)
Cholesterol, Total: 210 mg/dL — ABNORMAL HIGH (ref 100–199)
HDL: 37 mg/dL — ABNORMAL LOW (ref >39)
LDL Chol Calc (NIH): 142 mg/dL — ABNORMAL HIGH (ref 0–99)
Triglycerides: 169 mg/dL — ABNORMAL HIGH (ref 0–149)
VLDL Cholesterol Cal: 31 mg/dL (ref 5–40)

## 2023-01-24 ENCOUNTER — Telehealth: Payer: Self-pay

## 2023-01-24 DIAGNOSIS — E785 Hyperlipidemia, unspecified: Secondary | ICD-10-CM

## 2023-01-24 NOTE — Telephone Encounter (Signed)
Called patient on home phone Gave them the results of blood work, they report that they are now feeling less fatigued after stopping the Rosuvastatin. Would you like to continue with the plan down below or a different plan?

## 2023-01-24 NOTE — Telephone Encounter (Signed)
Left message to call back  

## 2023-01-24 NOTE — Telephone Encounter (Signed)
Please let [name] [name] know I am glad to hear he has been feeling less fatigued. In this case would recommend trying rosuvastatin 5 mg Monday, Wednesday and Friday to see if he can tolerate this. Otherwise could consider switching to pravastatin 10 mg daily to see if he tolerates this better. With repeat fasting lipid profile and lfts in 2 months.

## 2023-01-24 NOTE — Telephone Encounter (Signed)
-----   Message from Rip Harbour sent at 01/23/2023  5:11 PM EDT ----- Please let [name] [name] know that his LDL is above goal at 142. As he did not have improvement in fatigue following discontinuation of rosuvastatin 10 mg daily would recommend he resume this with repeat fasting lipid profile and LFTs in 2 months.

## 2023-01-25 ENCOUNTER — Other Ambulatory Visit: Payer: Self-pay

## 2023-01-25 DIAGNOSIS — E785 Hyperlipidemia, unspecified: Secondary | ICD-10-CM

## 2023-01-25 MED ORDER — ROSUVASTATIN CALCIUM 5 MG PO TABS: 5.0000 mg | ORAL_TABLET | ORAL | 3 refills | Status: DC

## 2023-01-25 NOTE — Telephone Encounter (Signed)
Called patient advised of below they verbalized understanding.

## 2023-01-26 ENCOUNTER — Ambulatory Visit (INDEPENDENT_AMBULATORY_CARE_PROVIDER_SITE_OTHER): Payer: Medicare HMO

## 2023-01-26 DIAGNOSIS — I428 Other cardiomyopathies: Secondary | ICD-10-CM | POA: Diagnosis not present

## 2023-01-26 LAB — CUP PACEART REMOTE DEVICE CHECK
Battery Remaining Longevity: 71 mo
Battery Remaining Percentage: 79 %
Battery Voltage: 3.01 V
Brady Statistic AP VP Percent: 2.8 %
Brady Statistic AP VS Percent: 1 %
Brady Statistic AS VP Percent: 94 %
Brady Statistic AS VS Percent: 3.2 %
Brady Statistic RA Percent Paced: 2.8 %
Date Time Interrogation Session: 20241031020017
HighPow Impedance: 71 Ohm
HighPow Impedance: 71 Ohm
Implantable Lead Connection Status: 753985
Implantable Lead Connection Status: 753985
Implantable Lead Connection Status: 753985
Implantable Lead Implant Date: 20230802
Implantable Lead Implant Date: 20230802
Implantable Lead Implant Date: 20230802
Implantable Lead Location: 753859
Implantable Lead Location: 753860
Implantable Lead Location: 753860
Implantable Lead Model: 7122
Implantable Pulse Generator Implant Date: 20230802
Lead Channel Impedance Value: 260 Ohm
Lead Channel Impedance Value: 390 Ohm
Lead Channel Impedance Value: 400 Ohm
Lead Channel Pacing Threshold Amplitude: 0.625 V
Lead Channel Pacing Threshold Amplitude: 0.75 V
Lead Channel Pacing Threshold Amplitude: 1 V
Lead Channel Pacing Threshold Pulse Width: 0.5 ms
Lead Channel Pacing Threshold Pulse Width: 0.5 ms
Lead Channel Pacing Threshold Pulse Width: 0.5 ms
Lead Channel Sensing Intrinsic Amplitude: 11.3 mV
Lead Channel Sensing Intrinsic Amplitude: 3.8 mV
Lead Channel Setting Pacing Amplitude: 0.25 V
Lead Channel Setting Pacing Amplitude: 2 V
Lead Channel Setting Pacing Amplitude: 2 V
Lead Channel Setting Pacing Pulse Width: 0.05 ms
Lead Channel Setting Pacing Pulse Width: 0.5 ms
Lead Channel Setting Sensing Sensitivity: 0.5 mV
Pulse Gen Serial Number: 5544603

## 2023-01-27 ENCOUNTER — Emergency Department (HOSPITAL_COMMUNITY)
Admission: EM | Admit: 2023-01-27 | Discharge: 2023-01-27 | Disposition: A | Discharge status: Home / Self Care | Payer: Medicare HMO | Attending: Emergency Medicine | Admitting: Emergency Medicine

## 2023-01-27 ENCOUNTER — Encounter (HOSPITAL_COMMUNITY): Payer: Self-pay | Admitting: Emergency Medicine

## 2023-01-27 ENCOUNTER — Other Ambulatory Visit: Payer: Self-pay

## 2023-01-27 ENCOUNTER — Emergency Department (HOSPITAL_COMMUNITY): Payer: Medicare HMO

## 2023-01-27 DIAGNOSIS — S134XXA Sprain of ligaments of cervical spine, initial encounter: Secondary | ICD-10-CM | POA: Diagnosis not present

## 2023-01-27 DIAGNOSIS — S161XXA Strain of muscle, fascia and tendon at neck level, initial encounter: Secondary | ICD-10-CM | POA: Insufficient documentation

## 2023-01-27 DIAGNOSIS — S199XXA Unspecified injury of neck, initial encounter: Secondary | ICD-10-CM | POA: Diagnosis present

## 2023-01-27 DIAGNOSIS — S0990XA Unspecified injury of head, initial encounter: Secondary | ICD-10-CM | POA: Diagnosis not present

## 2023-01-27 DIAGNOSIS — Z981 Arthrodesis status: Secondary | ICD-10-CM | POA: Diagnosis not present

## 2023-01-27 DIAGNOSIS — Y92410 Unspecified street and highway as the place of occurrence of the external cause: Secondary | ICD-10-CM | POA: Diagnosis not present

## 2023-01-27 DIAGNOSIS — C61 Malignant neoplasm of prostate: Secondary | ICD-10-CM | POA: Diagnosis not present

## 2023-01-27 DIAGNOSIS — Z7901 Long term (current) use of anticoagulants: Secondary | ICD-10-CM | POA: Diagnosis not present

## 2023-01-27 DIAGNOSIS — R519 Headache, unspecified: Secondary | ICD-10-CM | POA: Diagnosis not present

## 2023-01-27 DIAGNOSIS — N401 Enlarged prostate with lower urinary tract symptoms: Secondary | ICD-10-CM | POA: Diagnosis not present

## 2023-01-27 DIAGNOSIS — R351 Nocturia: Secondary | ICD-10-CM | POA: Diagnosis not present

## 2023-01-27 DIAGNOSIS — Z7982 Long term (current) use of aspirin: Secondary | ICD-10-CM | POA: Diagnosis not present

## 2023-01-27 MED ORDER — IBUPROFEN 200 MG PO TABS
600.0000 mg | ORAL_TABLET | Freq: Once | ORAL | Status: AC
  Administered 2023-01-27: 600 mg via ORAL
  Filled 2023-01-27: qty 3

## 2023-01-27 MED ORDER — ACETAMINOPHEN 325 MG PO TABS
650.0000 mg | ORAL_TABLET | Freq: Once | ORAL | Status: AC
  Administered 2023-01-27: 650 mg via ORAL
  Filled 2023-01-27: qty 2
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
## 2023-02-02 ENCOUNTER — Telehealth: Payer: Self-pay | Admitting: Cardiology

## 2023-02-02 NOTE — Telephone Encounter (Signed)
Patient states he received a notice that he needs lab work, but he already had lab work about 2 weeks ago and he would like clarification.

## 2023-02-02 NOTE — Telephone Encounter (Signed)
Returned pt's call. No answer. Left msg to call back 

## 2023-02-03 NOTE — Telephone Encounter (Signed)
Patient identification verified by 2 forms. Marilynn Rail, RN   Called and spoke to patient  Informed patient:   -Per 10/28 result note NP Chad advised restarting Rosuvastatin 10mg  and repeat Lipid and LFT labs in 2months   -should present to lab at end of December  Patient states he has restarted Rx, plans to present to lab  Patient had no further questions at this time

## 2023-02-06 ENCOUNTER — Ambulatory Visit: Payer: Medicare HMO | Admitting: Family Medicine

## 2023-02-06 ENCOUNTER — Ambulatory Visit (INDEPENDENT_AMBULATORY_CARE_PROVIDER_SITE_OTHER)
Admission: RE | Admit: 2023-02-06 | Discharge: 2023-02-06 | Disposition: A | Discharge status: Home / Self Care | Payer: Medicare HMO | Source: Ambulatory Visit | Attending: Family Medicine | Admitting: Family Medicine

## 2023-02-06 ENCOUNTER — Encounter: Payer: Self-pay | Admitting: Family Medicine

## 2023-02-06 DIAGNOSIS — M542 Cervicalgia: Secondary | ICD-10-CM | POA: Diagnosis not present

## 2023-02-06 DIAGNOSIS — M546 Pain in thoracic spine: Secondary | ICD-10-CM

## 2023-02-06 DIAGNOSIS — S3992XA Unspecified injury of lower back, initial encounter: Secondary | ICD-10-CM | POA: Diagnosis not present

## 2023-02-06 DIAGNOSIS — Z981 Arthrodesis status: Secondary | ICD-10-CM | POA: Diagnosis not present

## 2023-02-06 DIAGNOSIS — M549 Dorsalgia, unspecified: Secondary | ICD-10-CM | POA: Diagnosis not present

## 2023-02-06 DIAGNOSIS — M47816 Spondylosis without myelopathy or radiculopathy, lumbar region: Secondary | ICD-10-CM | POA: Diagnosis not present

## 2023-02-06 DIAGNOSIS — M4316 Spondylolisthesis, lumbar region: Secondary | ICD-10-CM | POA: Diagnosis not present

## 2023-02-06 DIAGNOSIS — M47814 Spondylosis without myelopathy or radiculopathy, thoracic region: Secondary | ICD-10-CM | POA: Diagnosis not present

## 2023-02-06 DIAGNOSIS — Z950 Presence of cardiac pacemaker: Secondary | ICD-10-CM | POA: Diagnosis not present

## 2023-02-06 DIAGNOSIS — M5441 Lumbago with sciatica, right side: Secondary | ICD-10-CM

## 2023-02-06 DIAGNOSIS — R7303 Prediabetes: Secondary | ICD-10-CM | POA: Diagnosis not present

## 2023-02-06 DIAGNOSIS — M5127 Other intervertebral disc displacement, lumbosacral region: Secondary | ICD-10-CM | POA: Diagnosis not present

## 2023-02-06 LAB — BASIC METABOLIC PANEL
BUN: 21 mg/dL (ref 6–23)
CO2: 25 meq/L (ref 19–32)
Calcium: 9.4 mg/dL (ref 8.4–10.5)
Chloride: 106 meq/L (ref 96–112)
Creatinine, Ser: 1.18 mg/dL (ref 0.40–1.50)
GFR: 58.99 mL/min — ABNORMAL LOW (ref >60.00)
Glucose, Bld: 99 mg/dL (ref 70–99)
Potassium: 4.1 meq/L (ref 3.5–5.1)
Sodium: 138 meq/L (ref 135–145)

## 2023-02-06 LAB — HEMOGLOBIN A1C: Hgb A1c MFr Bld: 6.4 % (ref 4.6–6.5)

## 2023-02-06 MED ORDER — PREDNISONE 20 MG PO TABS
ORAL_TABLET | ORAL | 0 refills | Status: DC
Start: 2023-02-06 — End: 2023-02-28
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
## 2023-02-08 NOTE — Progress Notes (Signed)
Remote ICD transmission.   

## 2023-02-10 ENCOUNTER — Telehealth: Payer: Self-pay

## 2023-02-10 NOTE — Telephone Encounter (Signed)
Transition Care Management Unsuccessful Follow-up Telephone Call  Date of discharge and from where:  [name] 11/15  Attempts:  1st Attempt  Reason for unsuccessful TCM follow-up call:  No answer/busy   Lenard Forth [place]  [facility], [facility] Guide, Phone: [phone] Website: [email or web address]

## 2023-02-13 ENCOUNTER — Telehealth: Payer: Self-pay

## 2023-02-13 ENCOUNTER — Encounter: Payer: Self-pay | Admitting: Family Medicine

## 2023-02-13 ENCOUNTER — Telehealth: Payer: Self-pay | Admitting: Family Medicine

## 2023-02-13 NOTE — Telephone Encounter (Signed)
Patient Name First: [name] Last: [name] Gender: Male DOB: [birth year] Age: 77 Y 9 M 16 D Return Phone Number: [phone] (Primary) Address: City/ State/ Zip: [place]  [address] Client [facility] Client Site [facility] - Day Provider Meredith Staggers- MD Contact Type Call Who Is Calling Patient / Member / Family / Caregiver Call Type Triage / Clinical Caller Name Concepcion Living Relationship To Patient Partner Return Phone Number [phone] (Primary) Chief Complaint CHEST PAIN - pain, pressure, heaviness or tightness Reason for Call Symptomatic / Request for Health Information Initial Comment Caller states her partner is having sharp shooting chest pains. Translation No Nurse Assessment Nurse: Para March, RN, Jasmine Date/Time (Eastern Time): 02/13/2023 3:37:40 PM Confirm and document reason for call. If symptomatic, describe symptoms. ---caller states that he has been having intermittent chest pains. Caller states patient is not currently with her and not currently having any chest pains and states that he just wanted to mention it to the doctor. States no current chest pain. Does the patient have any new or worsening symptoms? ---No Please document clinical information provided and list any resource used. ---No new or worsening s/s currently Disp. Time Lamount Cohen Time) Disposition Final User 02/13/2023 3:35:51 PM Send to Urgent Nelva Nay 02/13/2023 3:41:06 PM Clinical Call Yes Para March RN, [facility] Final Disposition 02/13/2023 3:41:06 PM Clinical Call Yes Para March, RN, Jasmine Comments User: Charisse March, RN Date/Time Lamount Cohen Time): 02/13/2023 3:40:56 PM PLEASE NOTE: All timestamps contained within this report are represented as [place] Standard Time. CONFIDENTIALTY NOTICE: This fax transmission is intended only for the addressee. It contains information that is legally privileged,  confidential or otherwise protected from use or disclosure. If you are not the intended recipient, you are strictly prohibited from reviewing, disclosing, copying using or disseminating any of this information or taking any action in reliance on or regarding this information. If you have received this fax in error, please notify us immediately by telephone so that we can arrange for its return to [place]. Phone: [phone], Toll-Free: [phone], Fax: [phone] Page: 2 of 2 Call Id: [id number] Comments ** Caller advised to have husband call as soon as he s having the pains or to be seen in the ED**  F/U tomorrow

## 2023-02-13 NOTE — Telephone Encounter (Signed)
Please try to reach patient again and needs to be evaluated by triage given chest symptoms.

## 2023-02-13 NOTE — Telephone Encounter (Signed)
Attempted call to patient no answer please advise, pt reports prednisone is helping some but has had some sharp pains in his chest denies SOB and tightness in the chest.

## 2023-02-13 NOTE — Telephone Encounter (Signed)
LM on both lines to call us back ASAP

## 2023-02-13 NOTE — Telephone Encounter (Signed)
Provider is aware

## 2023-02-13 NOTE — Telephone Encounter (Signed)
Transition Care Management Unsuccessful Follow-up Telephone Call  Date of discharge and from where:  [facility]  Attempts:  2nd Attempt  Reason for unsuccessful TCM follow-up call:  No answer/busy   Lenard Forth [place]  [facility], [facility] Guide, Phone: [phone] Website: [email or web address]

## 2023-02-13 NOTE — Telephone Encounter (Signed)
Caller name: Didn't get hard to hear   On DPR?: Yes  Call back number: [phone] (mobile)  Provider they see: Shade Flood, MD  Reason for call:   Pt in car with someone who previously spoke to [facility]. Called back and got front desk I sent him to triage.

## 2023-02-14 NOTE — Telephone Encounter (Signed)
[name] called back and they were sent to triage for assessment

## 2023-02-14 NOTE — Telephone Encounter (Signed)
Patient was made an appointment and reminded of ED precautions

## 2023-02-16 ENCOUNTER — Other Ambulatory Visit: Payer: Self-pay

## 2023-02-16 ENCOUNTER — Emergency Department (HOSPITAL_BASED_OUTPATIENT_CLINIC_OR_DEPARTMENT_OTHER): Payer: Medicare HMO

## 2023-02-16 ENCOUNTER — Ambulatory Visit (INDEPENDENT_AMBULATORY_CARE_PROVIDER_SITE_OTHER): Payer: Medicare HMO | Admitting: Family Medicine

## 2023-02-16 ENCOUNTER — Encounter: Payer: Self-pay | Admitting: Family Medicine

## 2023-02-16 ENCOUNTER — Emergency Department (HOSPITAL_BASED_OUTPATIENT_CLINIC_OR_DEPARTMENT_OTHER)
Admission: EM | Admit: 2023-02-16 | Discharge: 2023-02-16 | Disposition: A | Discharge status: Home / Self Care | Payer: Medicare HMO | Attending: Emergency Medicine | Admitting: Emergency Medicine

## 2023-02-16 VITALS — BP 124/66 | HR 85 | Temp 98.2°F | Ht 68.0 in | Wt 189.0 lb

## 2023-02-16 DIAGNOSIS — R079 Chest pain, unspecified: Secondary | ICD-10-CM | POA: Diagnosis not present

## 2023-02-16 DIAGNOSIS — M25512 Pain in left shoulder: Secondary | ICD-10-CM | POA: Diagnosis not present

## 2023-02-16 DIAGNOSIS — Z950 Presence of cardiac pacemaker: Secondary | ICD-10-CM | POA: Insufficient documentation

## 2023-02-16 DIAGNOSIS — M79602 Pain in left arm: Secondary | ICD-10-CM

## 2023-02-16 DIAGNOSIS — I5022 Chronic systolic (congestive) heart failure: Secondary | ICD-10-CM

## 2023-02-16 DIAGNOSIS — R918 Other nonspecific abnormal finding of lung field: Secondary | ICD-10-CM | POA: Diagnosis not present

## 2023-02-16 DIAGNOSIS — R0789 Other chest pain: Secondary | ICD-10-CM | POA: Insufficient documentation

## 2023-02-16 LAB — COMPREHENSIVE METABOLIC PANEL
ALT: 15 U/L (ref 0–44)
AST: 15 U/L (ref 15–41)
Albumin: 4.4 g/dL (ref 3.5–5.0)
Alkaline Phosphatase: 48 U/L (ref 38–126)
Anion gap: 9 (ref 5–15)
BUN: 24 mg/dL — ABNORMAL HIGH (ref 8–23)
CO2: 25 mmol/L (ref 22–32)
Calcium: 9.5 mg/dL (ref 8.9–10.3)
Chloride: 105 mmol/L (ref 98–111)
Creatinine, Ser: 1.32 mg/dL — ABNORMAL HIGH (ref 0.61–1.24)
GFR, Estimated: 55 mL/min — ABNORMAL LOW (ref >60)
Glucose, Bld: 107 mg/dL — ABNORMAL HIGH (ref 70–99)
Potassium: 4 mmol/L (ref 3.5–5.1)
Sodium: 139 mmol/L (ref 135–145)
Total Bilirubin: 0.5 mg/dL (ref <1.2)
Total Protein: 6.9 g/dL (ref 6.5–8.1)

## 2023-02-16 LAB — CBC WITH DIFFERENTIAL/PLATELET
Abs Immature Granulocytes: 0.01 10*3/uL (ref 0.00–0.07)
Basophils Absolute: 0 10*3/uL (ref 0.0–0.1)
Basophils Relative: 1 %
Eosinophils Absolute: 0 10*3/uL (ref 0.0–0.5)
Eosinophils Relative: 0 %
HCT: 40.1 % (ref 39.0–52.0)
Hemoglobin: 13.1 g/dL (ref 13.0–17.0)
Immature Granulocytes: 0 %
Lymphocytes Relative: 39 %
Lymphs Abs: 3 10*3/uL (ref 0.7–4.0)
MCH: 29.3 pg (ref 26.0–34.0)
MCHC: 32.7 g/dL (ref 30.0–36.0)
MCV: 89.7 fL (ref 80.0–100.0)
Monocytes Absolute: 0.5 10*3/uL (ref 0.1–1.0)
Monocytes Relative: 7 %
Neutro Abs: 4.2 10*3/uL (ref 1.7–7.7)
Neutrophils Relative %: 53 %
Platelets: 156 10*3/uL (ref 150–400)
RBC: 4.47 MIL/uL (ref 4.22–5.81)
RDW: 14 % (ref 11.5–15.5)
WBC: 7.8 10*3/uL (ref 4.0–10.5)
nRBC: 0 % (ref 0.0–0.2)

## 2023-02-16 LAB — TROPONIN I (HIGH SENSITIVITY)
Troponin I (High Sensitivity): 57 ng/L — ABNORMAL HIGH (ref <18)
Troponin I (High Sensitivity): 52 ng/L — ABNORMAL HIGH (ref <18)

## 2023-02-16 LAB — LIPASE, BLOOD: Lipase: 98 U/L — ABNORMAL HIGH (ref 11–51)

## 2023-02-16 NOTE — Patient Instructions (Signed)
As we discussed I do recommend further evaluation through the emergency room today for the previous chest pains and now persistent left arm pain today.  I did recommend EMS transport, but as that was declined if you have any change in your symptoms on the way to the ER, pull over and call 911.  I will let the [facility] know you are on the way.  I suspect they will do some testing to make sure this is not coming from your heart, and potentially could be from your neck, but ER evaluation needed given the other chest symptoms you have experienced recently.  Hang in there.

## 2023-02-16 NOTE — ED Notes (Signed)
Patient stated he had a terrible sharp pain go through his head. Patient is crying at the pain he had. States it has gone away but scared him.

## 2023-02-16 NOTE — ED Notes (Signed)
Discharge paperwork given and verbally understood. 

## 2023-02-16 NOTE — ED Notes (Signed)
Pacemaker interrogation attempted multi times, Contacted support and still no result... Provider informed.Marland KitchenMarland Kitchen

## 2023-02-16 NOTE — Progress Notes (Signed)
Subjective:  Patient ID: [name] [name], male    DOB: 04-Aug-1945  Age: 77 y.o. MRN: [id number]  CC:  Chief Complaint  Patient presents with   Chest Pain    Pt notes a few episodes last week where he had a sharp pain in his chest that lasted a few minutes, then an episode Monday where he felt hot in the chest     HPI [name] [name] presents for   Chest pain Sharp pains in left chest last week when sitting in [place] - few episodes lasted few seconds and resolved. No associated n/v/diaphoresis or dyspnea or arm radiation. No rash/redness.   Yesterday. Warm sensation on left chest - lasted few seconds. Ok last night.   This morning felt pain in left arm - up and down left arm. Some chronic neck pain, possible increased neck pain this am. Pain in arm moves up to front of left shoulder and upper chest - not mid or lower chest.  No n/v, diaphoresis. Pain has continued in left arm today. No similar arm symptoms in the past with his neck pain.  No heart palpitations.  No cough/dyspnea, or recent illness. No new DOE. No leg swelling, some cramps in both calves at times.  No feeling of ICD shocks.    Tx: none   History of nonischemic neuropathy, left bundle branch block, chronic CHF with reduced EF, status post BiV ICD, PAF, hypertriglyceridemia.  ICD replacement in 2023, microperforation with cardiac tamponade requiring placement of pericardial drain and started on colchicine.  Developed atrial fibrillation at that time and started on amiodarone.  Echo in June with EF 20 to 25%, severely decreased function left ventricle with global hypokinesis.  Normal pulmonary artery systolic pressure, trivial MR.  In July still with exertional dyspnea and fatigue, coronary slowly decreased until discontinuation with increase of hydralazine.  Last office visit with cardiology October 7.  EKG at that time with atrial sensed ventricular paced rhythm.  Rate 76.  Recommendation per Dr. Herbie Baltimore at that  visit was to add spironolactone or SGLT2 inhibitor, but patient deferred.  Plan for follow-up in 2 to 3 months.  Was continued on Lasix 40 mg twice weekly, aspirin 81 mg daily and hydralazine 50 mg twice daily.  History Patient Active Problem List   Diagnosis Date Noted   Fatigue 08/17/2022   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   Hx of colonic polyps 01/10/2022   Atrial fibrillation, transient (HCC) 11/02/2021   Hypotension    Chest pain 10/28/2021   Prostate cancer (HCC) 04/02/2021   Chronic HFrEF (heart failure with reduced ejection fraction) (HCC) 05/20/2020   Claudication (HCC) 09/01/2019   S/P cervical spinal fusion 04/20/2018   Paresthesia 01/10/2018   Neck pain 01/10/2018   LBBB (left bundle branch block) 09/07/2017   Other meniscus derangements, posterior horn of medial meniscus, left knee, insufficency fracture medial tibial plateau 04/07/2017   Closed nondisplaced fracture of lateral condyle of left tibia with routine healing 04/07/2017   Status post arthroscopy of left knee 04/07/2017   Insomnia 09/14/2015   Hyperlipidemia with target LDL less than 100 09/11/2014   Rotator cuff tendinitis 04/28/2014   Obesity (BMI 30-39.9) 12/29/2013   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   Exertional shortness of breath 06/26/2012   Polyp of colon, adenomatous 04/27/2012   Elevated PSA 10/20/2011   Hypertriglyceridemia 07/08/2011   Nonischemic cardiomyopathy (HCC) 09/04/2009   Past Medical History:  Diagnosis Date   Arthritis  BPH (benign prostatic hypertrophy)    CHF (congestive heart failure) (HCC)    Elevated PSA    Eye problem 03/06/2016   Dr. Jerilynn Birkenhead retinal detachment left eye, no sx done   History of gout    pt 05-20-2013 states stable    History of melanoma excision    2011-- LEFT UPPER BACK   Hyperlipemia    Hypertriglyceridemia    ICD (implantable cardioverter-defibrillator) battery depletion 10/27/2021   Left bundle branch  block 08/2017   Concern for LBBB mediated cardiomyopathy   Melanoma (HCC) 2011   back   Nonischemic cardiomyopathy (HCC) 09/2020   08/2009: EF 40-45%, Global HK-> 09/2017 -> EF 45-50% ; 3/'22: EF<20%.  Elevated LVEDP.;  09/2020: EF 20-25%.  GR 1 DD   Numbness    hands   Personal history of arthritis    Personal history of other diseases of circulatory system    Pre-diabetes    Sickle cell anemia (HCC)    Past Surgical History:  Procedure Laterality Date   ANTERIOR CERVICAL DECOMP/DISCECTOMY FUSION N/A 04/20/2018   Procedure: Anterior Cervical Decompression Fusion - Cervical Three-Cervical Four - Cervical Four-Cervical Five - Cervical Five-Cervical Six;  Surgeon: Tia Alert, MD;  Location: [facility] OR;  Service: Neurosurgery;  Laterality: N/A;  Anterior Cervical Decompression Fusion - Cervical Three-Cervical Four - Cervical Four-Cervical Five - Cervical Five-Cervical Six   APPENDECTOMY  AS CHILD   BACK SURGERY     BIV ICD INSERTION CRT-D N/A 10/27/2021   Procedure: BIV ICD INSERTION CRT-D;  Surgeon: Marinus Maw, MD;  Location: [facility] INVASIVE CV LAB;  Service: Cardiovascular;  Laterality: N/A;   CARDIOVASCULAR STRESS TEST  10/05/1998   MILD GLOBAL HYPOKINESIS AND ISCHEMIAIN ANTEROSEPTAL  AT [facility]/ EF 42%   CARPAL TUNNEL RELEASE Right    CENTRAL LINE INSERTION  10/28/2021   Procedure: CENTRAL LINE INSERTION;  Surgeon: Regan Lemming, MD;  Location: [facility] INVASIVE CV LAB;  Service: Cardiovascular;;   CENTRAL LINE INSERTION  10/28/2021   Procedure: CENTRAL LINE INSERTION;  Surgeon: Tonny Bollman, MD;  Location: [facility] INVASIVE CV LAB;  Service: Cardiovascular;;   COLONOSCOPY WITH PROPOFOL N/A 03/14/2022   Procedure: COLONOSCOPY WITH PROPOFOL;  Surgeon: Napoleon Form, MD;  Location: [facility] ENDOSCOPY;  Service: Gastroenterology;  Laterality: N/A;   EXCISION MELANOMA LEFT UPPER BACK  10/14/2009   eye lid surgery Bilateral 2018   GOLD SEED IMPLANT N/A 07/23/2021   Procedure: GOLD SEED IMPLANT;   Surgeon: Crist Fat, MD;  Location: [facility] ORS;  Service: Urology;  Laterality: N/A;   KNEE ARTHROSCOPY WITH SUBCHONDROPLASTY Left 04/07/2017   Procedure: LEFT KNEE ARTHROSCOPY WITH PARTIAL MEDIAL MENISCECTOMY AND MEDIAL TIBIAL SUBCHONDROPLASTY;  Surgeon: Kathryne Hitch, MD;  Location: [facility] ORS;  Service: Orthopedics;  Laterality: Left;   knee injections Bilateral    LEAD REVISION/REPAIR N/A 10/28/2021   Procedure: LEAD REVISION/REPAIR;  Surgeon: Regan Lemming, MD;  Location: [facility] INVASIVE CV LAB;  Service: Cardiovascular;  Laterality: N/A;   LEFT HEART CATH AND CORONARY ANGIOGRAPHY  04/2002   minimal irregularities in the LAD/  EF 50%  -   DR AL LITTLE   LUMBAR DISC SURGERY  09/12/2000   left  L5 -- S1   PERICARDIOCENTESIS N/A 10/28/2021   Procedure: PERICARDIOCENTESIS;  Surgeon: Regan Lemming, MD;  Location: [facility] INVASIVE CV LAB;  Service: Cardiovascular;  Laterality: N/A;   PERICARDIOCENTESIS N/A 10/28/2021   Procedure: PERICARDIOCENTESIS;  Surgeon: Tonny Bollman, MD;  Location: [facility] INVASIVE CV LAB;  Service:  Cardiovascular;  Laterality: N/A;   PROSTATE BIOPSY N/A 05/22/2013   Procedure: BIOPSY TRANSRECTAL ULTRASONIC PROSTATE (TUBP);  Surgeon: Valetta Fuller, MD;  Location: [facility];  Service: Urology;  Laterality: N/A;   RIGHT/LEFT HEART CATH AND CORONARY ANGIOGRAPHY N/A 06/25/2020   Procedure: RIGHT/LEFT HEART CATH AND CORONARY ANGIOGRAPHY;  Surgeon: Lyn Records, MD;  Location: [facility] INVASIVE CV LAB;:; Normal coronary arteries; LVEDP 23 mmHg; PCWP 19 mmHg.   ROTATOR CUFF REPAIR Right 2016   SPACE OAR INSTILLATION N/A 07/23/2021   Procedure: SPACE OAR INSTILLATION;  Surgeon: Crist Fat, MD;  Location: [facility] ORS;  Service: Urology;  Laterality: N/A;   THROAT SURGERY  04/20/2018   TRANSRECTAL ULTRASOUND PROSTATE BX  05-23-2005  &  04-19-2001   TRANSTHORACIC ECHOCARDIOGRAM  09/2017   EF 45-50 % (previously reported as 40 to 45%).  Incoordinate septal  motion with mild LVH.  GR 1 DD.  Aortic sclerosis but no stenosis.   TRANSTHORACIC ECHOCARDIOGRAM  10/06/2020   Severely reduced EF of 20 to 25%.  No LV thrombus.  Severe septal-lateral wall systolic dyssynchrony due to LBBB.  Global HK.  Moderately dilated LV.  GR 1 DD with mild LA dilation..  Unable to assess PAP with normal RV size and function.  Normal RAP.  Mild AOV sclerosis but no stenosis.  Mild to moderate MR.   TRANSTHORACIC ECHOCARDIOGRAM  06/16/2020   Severely reduced EF <20%.  Moderate severely dilated LV.  GR 2 DD.  Elevated LVEDP.  Mild LA dilation.  Mildly reduced RV function.  Mild MR.  Mild aortic valve calcification   Allergies  Allergen Reactions   Nitrostat [Nitroglycerin] Anaphylaxis    "Cardiologist suggested he shouldn't take this med because it could kill him with his heart condition. Lowers BP"   Ace Inhibitors Swelling   Mevacor [Lovastatin] Nausea Only   Solarcaine [Benzocaine] Dermatitis   Prior to Admission medications   Medication Sig Start Date End Date Taking? Authorizing Provider  acetaminophen (TYLENOL) 500 MG tablet Take 1,000 mg by mouth every 6 (six) hours as needed for moderate pain.   Yes [provider]  aspirin EC 81 MG tablet Take 1 tablet (81 mg total) by mouth daily. 04/26/18  Yes Costella, Darci Current, PA-C  colchicine 0.6 MG tablet TAKE 1 TABLET TWICE DAILY 12/27/22  Yes Shade Flood, MD  Cyanocobalamin (VITAMIN B-12 PO) Take 5,000 mcg by mouth daily.   Yes [provider]  furosemide (LASIX) 40 MG tablet TAKE 1 TABLET TWICE DAILY Patient taking differently: Take 40 mg by mouth 2 (two) times a week. 01/10/22  Yes [name], MD  gabapentin (NEURONTIN) 300 MG capsule Take 1-2 capsules (300-600 mg total) by mouth 3 (three) times daily as needed (pain). Patient taking differently: Take 300-600 mg by mouth 2 (two) times daily. 02/26/21  Yes Shade Flood, MD  MAGNESIUM PO Take 400 mg by mouth in the morning.   Yes  [provider]  Multiple Vitamin (MULTIVITAMIN WITH MINERALS) TABS tablet Take 1 tablet by mouth daily. One-A-Day Men Health Multivitamin   Yes [provider]  naproxen sodium (ALEVE) 220 MG tablet Take 440 mg by mouth daily as needed (pain).   Yes [provider]  Omega-3 Fatty Acids (FISH OIL PO) Take 1,000 mg by mouth in the morning.   Yes [provider]  predniSONE (DELTASONE) 20 MG tablet 3 by mouth for 2 days, then 2 by mouth for 2 days, then 1 by  mouth for 2 days, then 1/2 by mouth for 2 days. 02/06/23  Yes Shade Flood, MD  rosuvastatin (CRESTOR) 5 MG tablet Take 1 tablet (5 mg total) by mouth every Monday, Wednesday, and Friday. 01/25/23 04/25/23 Yes Reather Littler D, NP  tamsulosin (FLOMAX) 0.4 MG CAPS capsule TAKE 1 CAPSULE EVERY DAY 08/04/22  Yes Shade Flood, MD  Turmeric (QC TUMERIC COMPLEX PO) Take 1 tablet by mouth in the morning.   Yes [provider]  docusate sodium (COLACE) 100 MG capsule Take 100 mg by mouth at bedtime. Patient not taking: Reported on 01/02/2023    [provider]  finasteride (PROSCAR) 5 MG tablet Take 5 mg by mouth daily in the afternoon. Patient not taking: Reported on 01/02/2023 03/10/22   [provider]  hydrALAZINE (APRESOLINE) 50 MG tablet Take 1 tablet (50 mg total) by mouth in the morning and at bedtime. 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  Tobacco Use   Smoking status: Former    Current packs/day: 0.00    Average packs/day: 2.0 packs/day for 20.0 years (40.0 ttl pk-yrs)    Types: Cigarettes    Start date: 03/28/1960    Quit date: 03/28/1980    Years since quitting: 42.9    Smokeless tobacco: Never  Vaping Use   Vaping status: Never Used  Substance and Sexual Activity   Alcohol use: No    Alcohol/week: 0.0 standard drinks of alcohol   Drug use: No   Sexual activity: Not Currently  Other Topics Concern   Not on file  Social History Narrative   He walks on a relatively regular basis about 15 minutes at a time mostly to the discomfort. He previously had walked up a 30-40 minutes a time without problems. Education: [employer].   Lives alone.   Right-handed.   One cup coffee daily.   Social Determinants of Health   Financial Resource Strain: Low Risk  (12/01/2022)   Overall Financial Resource Strain (CARDIA)    Difficulty of Paying Living Expenses: Not hard at all  Food Insecurity: No Food Insecurity (12/01/2022)   Hunger Vital Sign    Worried About Running Out of Food in the Last Year: Never true    Ran Out of Food in the Last Year: Never true  Transportation Needs: No Transportation Needs (12/01/2022)   PRAPARE - Administrator, Civil Service (Medical): No    Lack of Transportation (Non-Medical): No  Physical Activity: Sufficiently Active (12/01/2022)   Exercise Vital Sign    Days of Exercise per Week: 5 days    Minutes of Exercise per Session: 30 min  Stress: No Stress Concern Present (12/01/2022)   [employer] of Occupational Health - Occupational Stress Questionnaire    Feeling of Stress : Not at all  Social Connections: Moderately Isolated (12/01/2022)   Social Connection and Isolation Panel [NHANES]    Frequency of Communication with Friends and Family: More than three times a week    Frequency of Social Gatherings with Friends and Family: Three times a week    Attends Religious Services: More than 4 times per year    Active Member  of Clubs or Organizations: No    Attends Banker Meetings: Never    Marital Status: Widowed  Intimate Partner Violence: Not At Risk (12/01/2022)   Humiliation, Afraid, Rape, and Kick  questionnaire    Fear of Current or Ex-Partner: No    Emotionally Abused: No    Physically Abused: No    Sexually Abused: No    Review of Systems Per HPI.   Objective:   Vitals:   02/16/23 1348  BP: 124/66  Pulse: 85  Temp: 98.2 F (36.8 C)  TempSrc: Temporal  SpO2: 98%  Weight: 189 lb (85.7 kg)  Height: 5\' 8"  (1.727 m)     Physical Exam Vitals reviewed.  Constitutional:      Appearance: He is well-developed.  HENT:     Head: Normocephalic and atraumatic.  Neck:     Vascular: No carotid bruit or JVD.  Cardiovascular:     Rate and Rhythm: Normal rate and regular rhythm.     Heart sounds: Normal heart sounds. No murmur heard. Pulmonary:     Effort: Pulmonary effort is normal.     Breath sounds: Normal breath sounds. No rales.  Musculoskeletal:     Right lower leg: No edema.     Left lower leg: No edema.     Comments: C-spine, minimal rotation, discomfort in posterior neck with motion but does not radiate down left arm/shoulder and does not reproduce his left arm symptoms or change of symptoms. Unable to reproduce discomfort with palpation of shoulder, upper arm, elbow or forearm. No rash of chest wall, or arm.  Skin:    General: Skin is warm and dry.  Neurological:     Mental Status: He is alert and oriented to person, place, and time.  Psychiatric:        Mood and Affect: Mood normal.     EKG, atrial sensed ventricular paced rhythm with rate of 78.No changes from EKG on 01/02/2023, atrial sensed ventricular paced rhythm at that time as well.   Assessment & Plan:  [name] [name] is a 77 y.o. male . Left arm pain - Plan: EKG 12-Lead  Left-sided chest pain - Plan: EKG 12-Lead  Chronic HFrEF (heart failure with reduced ejection fraction) (HCC) - Plan: EKG 12-Lead For left-sided chest pain/dysesthesias with warm sensation recently, now with persistent left arm pain since this morning.  Pain radiates up to the anterior shoulder, possibly slightly into the  upper chest but not into his mid or lower chest, denies other chest pain or pressure today.  Denies associated diaphoresis, nausea, vomiting, or dyspnea.  Chronic cervical spine disease, but no previous left arm symptoms and no change in symptoms with cervical exam.  No rash, and unable to reproduce pain with light touch or palpation of arm, less likely zoster.  History of nonischemic cardiomyopathy, chronic heart failure with reduced EF.  Unable to determine if any changes by EKG with atrial sensed ventricular paced rhythm as above.  ER evaluation recommended.  EMS transport discussed but that was declined, he plans to drive himself by private vehicle to ER.  #1 precautions given if any change in symptoms and risks of self transport discussed.  Understanding expressed.    2:43 PM Advised physician at [facility] ER - Dr. Theresia Lo.  No orders of the defined types were placed in this encounter.  Patient Instructions  As we discussed I do recommend further evaluation through the emergency room today for the previous chest pains and now persistent left  arm pain today.  I did recommend EMS transport, but as that was declined if you have any change in your symptoms on the way to the ER, pull over and call 911.  I will let the med center [facility] know you are on the way.  I suspect they will do some testing to make sure this is not coming from your heart, and potentially could be from your neck, but ER evaluation needed given the other chest symptoms you have experienced recently.  Hang in there.    Signed,   Meredith Staggers, MD [facility], [facility] 02/16/23 2:37 PM
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
## 2023-02-16 NOTE — ED Triage Notes (Addendum)
Pt POV ambulatory to room reporting left side chest pain that radiates down to arm and states defibrillator feels warm, seen by PCP today, advised to come to ED. Denies sob, slightly diaphoretic, states he has had similar episodes multiple times this week.

## 2023-02-17 ENCOUNTER — Ambulatory Visit: Payer: Medicare HMO | Admitting: Family Medicine
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
## 2023-02-27 ENCOUNTER — Other Ambulatory Visit: Payer: Self-pay

## 2023-02-27 ENCOUNTER — Encounter: Payer: Self-pay | Admitting: Family Medicine

## 2023-02-27 DIAGNOSIS — G6289 Other specified polyneuropathies: Secondary | ICD-10-CM

## 2023-02-27 MED ORDER — GABAPENTIN 300 MG PO CAPS: 300.0000 mg | ORAL_CAPSULE | Freq: Three times a day (TID) | ORAL | 1 refills | Status: DC | PRN

## 2023-02-28 ENCOUNTER — Encounter: Payer: Self-pay | Admitting: Adult Health

## 2023-02-28 ENCOUNTER — Ambulatory Visit: Payer: Medicare HMO | Attending: Adult Health | Admitting: Adult Health

## 2023-02-28 VITALS — BP 122/62 | HR 81 | Ht 68.0 in | Wt 190.0 lb

## 2023-02-28 DIAGNOSIS — E785 Hyperlipidemia, unspecified: Secondary | ICD-10-CM

## 2023-02-28 DIAGNOSIS — I428 Other cardiomyopathies: Secondary | ICD-10-CM

## 2023-02-28 DIAGNOSIS — I4891 Unspecified atrial fibrillation: Secondary | ICD-10-CM

## 2023-02-28 DIAGNOSIS — R0789 Other chest pain: Secondary | ICD-10-CM | POA: Diagnosis not present

## 2023-02-28 DIAGNOSIS — Z95810 Presence of automatic (implantable) cardiac defibrillator: Secondary | ICD-10-CM | POA: Diagnosis not present

## 2023-02-28 NOTE — Patient Instructions (Signed)
Medication Instructions:  No Changes *If you need a refill on your cardiac medications before your next appointment, please call your pharmacy*   Lab Work: CMEt, Lipid Panel If you have labs (blood work) drawn today and your tests are completely normal, you will receive your results only by: MyChart Message (if you have MyChart) OR A paper copy in the mail If you have any lab test that is abnormal or we need to change your treatment, we will call you to review the results.   Testing/Procedures: No Testing   Follow-Up: At [facility], you and your health needs are our priority.  As part of our continuing mission to provide you with exceptional heart care, we have created designated Provider Care Teams.  These Care Teams include your primary Cardiologist (physician) and Advanced Practice Providers (APPs -  Physician Assistants and Nurse Practitioners) who all work together to provide you with the care you need, when you need it.  We recommend signing up for the patient portal called "MyChart".  Sign up information is provided on this After Visit Summary.  MyChart is used to connect with patients for Virtual Visits (Telemedicine).  Patients are able to view lab/test results, encounter notes, upcoming appointments, etc.  Non-urgent messages can be sent to your provider as well.   To learn more about what you can do with MyChart, go to [email or web address].    Your next appointment:   Keep Scheduled Appointment   Provider:   Reather Littler, NP

## 2023-03-06 ENCOUNTER — Ambulatory Visit: Payer: Medicare HMO | Attending: Internal Medicine | Admitting: Internal Medicine

## 2023-03-06 ENCOUNTER — Encounter: Payer: Self-pay | Admitting: Internal Medicine

## 2023-03-06 VITALS — BP 122/68 | HR 80 | Ht 68.5 in | Wt 190.0 lb

## 2023-03-06 DIAGNOSIS — I5022 Chronic systolic (congestive) heart failure: Secondary | ICD-10-CM

## 2023-03-06 DIAGNOSIS — D571 Sickle-cell disease without crisis: Secondary | ICD-10-CM | POA: Diagnosis not present

## 2023-03-06 NOTE — Progress Notes (Signed)
HPI Mr. Pari returns today s/p biv ICD. The patient is a pleasant 77 yo man with chronic systolic heart failure and a non-ischemic CM, and LBBB. The patient has not had syncope. He has class 2 CHF symptoms. He is active since his biv ICD was inserted.  Allergies  Allergen Reactions   Nitrostat [Nitroglycerin] Anaphylaxis    "Cardiologist suggested he shouldn't take this med because it could kill him with his heart condition. Lowers BP"   Ace Inhibitors Swelling   Mevacor [Lovastatin] Nausea Only   Solarcaine [Benzocaine] Dermatitis     Current Outpatient Medications  Medication Sig Dispense Refill   acetaminophen (TYLENOL) 500 MG tablet Take 1,000 mg by mouth every 6 (six) hours as needed for moderate pain.     aspirin EC 81 MG tablet Take 1 tablet (81 mg total) by mouth daily.     colchicine 0.6 MG tablet TAKE 1 TABLET TWICE DAILY 90 tablet 0   Cyanocobalamin (VITAMIN B-12 PO) Take 5,000 mcg by mouth daily.     finasteride (PROSCAR) 5 MG tablet Take 5 mg by mouth daily.     FLUZONE HIGH-DOSE 0.5 ML injection Inject 0.5 mLs into the muscle once.     furosemide (LASIX) 40 MG tablet TAKE 1 TABLET TWICE DAILY (Patient taking differently: Take 40 mg by mouth 2 (two) times a week.) 120 tablet 10   gabapentin (NEURONTIN) 300 MG capsule Take 1-2 capsules (300-600 mg total) by mouth 3 (three) times daily as needed (pain). 540 capsule 1   MAGNESIUM PO Take 400 mg by mouth in the morning.     Multiple Vitamin (MULTIVITAMIN WITH MINERALS) TABS tablet Take 1 tablet by mouth daily. One-A-Day Men Health Multivitamin     naproxen sodium (ALEVE) 220 MG tablet Take 440 mg by mouth daily as needed (pain).     Omega-3 Fatty Acids (FISH OIL PO) Take 1,000 mg by mouth in the morning.     rosuvastatin (CRESTOR) 5 MG tablet Take 1 tablet (5 mg total) by mouth every Monday, Wednesday, and Friday. 36 tablet 3   tamsulosin (FLOMAX) 0.4 MG CAPS capsule TAKE 1 CAPSULE EVERY DAY 90 capsule 3   Turmeric (QC  TUMERIC COMPLEX PO) Take 1 tablet by mouth in the morning.     hydrALAZINE (APRESOLINE) 50 MG tablet Take 1 tablet (50 mg total) by mouth in the morning and at bedtime. 180 tablet 3   Current Facility-Administered Medications  Medication Dose Route Frequency Provider Last Rate Last Admin   sodium chloride flush (NS) 0.9 % injection 3 mL  3 mL Intravenous Q12H Ronney Asters, NP         Past Medical History:  Diagnosis Date   Arthritis    BPH (benign prostatic hypertrophy)    CHF (congestive heart failure) (HCC)    Elevated PSA    Eye problem 03/06/2016   Dr. Jerilynn Birkenhead retinal detachment left eye, no sx done   History of gout    pt 05-20-2013 states stable    History of melanoma excision    2011-- LEFT UPPER BACK   Hyperlipemia    Hypertriglyceridemia    ICD (implantable cardioverter-defibrillator) battery depletion 10/27/2021   Left bundle branch block 08/2017   Concern for LBBB mediated cardiomyopathy   Melanoma (HCC) 2011   back   Nonischemic cardiomyopathy (HCC) 09/2020   08/2009: EF 40-45%, Global HK-> 09/2017 -> EF 45-50% ; 3/'22: EF<20%.  Elevated LVEDP.;  09/2020: EF 20-25%.  GR 1  DD   Numbness    hands   Personal history of arthritis    Personal history of other diseases of circulatory system    Pre-diabetes    Sickle cell anemia (HCC)     ROS:   All systems reviewed and negative except as noted in the HPI.   Past Surgical History:  Procedure Laterality Date   ANTERIOR CERVICAL DECOMP/DISCECTOMY FUSION N/A 04/20/2018   Procedure: Anterior Cervical Decompression Fusion - Cervical Three-Cervical Four - Cervical Four-Cervical Five - Cervical Five-Cervical Six;  Surgeon: Tia Alert, MD;  Location: [facility] OR;  Service: Neurosurgery;  Laterality: N/A;  Anterior Cervical Decompression Fusion - Cervical Three-Cervical Four - Cervical Four-Cervical Five - Cervical Five-Cervical Six   APPENDECTOMY  AS CHILD   BACK SURGERY     BIV ICD INSERTION CRT-D N/A 10/27/2021    Procedure: BIV ICD INSERTION CRT-D;  Surgeon: Marinus Maw, MD;  Location: [facility] INVASIVE CV LAB;  Service: Cardiovascular;  Laterality: N/A;   CARDIOVASCULAR STRESS TEST  10/05/1998   MILD GLOBAL HYPOKINESIS AND ISCHEMIAIN ANTEROSEPTAL  AT [facility]/ EF 42%   CARPAL TUNNEL RELEASE Right    CENTRAL LINE INSERTION  10/28/2021   Procedure: CENTRAL LINE INSERTION;  Surgeon: Regan Lemming, MD;  Location: [facility] INVASIVE CV LAB;  Service: Cardiovascular;;   CENTRAL LINE INSERTION  10/28/2021   Procedure: CENTRAL LINE INSERTION;  Surgeon: Tonny Bollman, MD;  Location: [facility] INVASIVE CV LAB;  Service: Cardiovascular;;   COLONOSCOPY WITH PROPOFOL N/A 03/14/2022   Procedure: COLONOSCOPY WITH PROPOFOL;  Surgeon: Napoleon Form, MD;  Location: [facility] ENDOSCOPY;  Service: Gastroenterology;  Laterality: N/A;   EXCISION MELANOMA LEFT UPPER BACK  10/14/2009   eye lid surgery Bilateral 2018   GOLD SEED IMPLANT N/A 07/23/2021   Procedure: GOLD SEED IMPLANT;  Surgeon: Crist Fat, MD;  Location: [facility] ORS;  Service: Urology;  Laterality: N/A;   KNEE ARTHROSCOPY WITH SUBCHONDROPLASTY Left 04/07/2017   Procedure: LEFT KNEE ARTHROSCOPY WITH PARTIAL MEDIAL MENISCECTOMY AND MEDIAL TIBIAL SUBCHONDROPLASTY;  Surgeon: Kathryne Hitch, MD;  Location: [facility] ORS;  Service: Orthopedics;  Laterality: Left;   knee injections Bilateral    LEAD REVISION/REPAIR N/A 10/28/2021   Procedure: LEAD REVISION/REPAIR;  Surgeon: Regan Lemming, MD;  Location: [facility] INVASIVE CV LAB;  Service: Cardiovascular;  Laterality: N/A;   LEFT HEART CATH AND CORONARY ANGIOGRAPHY  04/2002   minimal irregularities in the LAD/  EF 50%  -   DR AL LITTLE   LUMBAR DISC SURGERY  09/12/2000   left  L5 -- S1   PERICARDIOCENTESIS N/A 10/28/2021   Procedure: PERICARDIOCENTESIS;  Surgeon: Regan Lemming, MD;  Location: [facility] INVASIVE CV LAB;  Service: Cardiovascular;  Laterality: N/A;   PERICARDIOCENTESIS N/A 10/28/2021   Procedure: PERICARDIOCENTESIS;   Surgeon: Tonny Bollman, MD;  Location: [facility] INVASIVE CV LAB;  Service: Cardiovascular;  Laterality: N/A;   PROSTATE BIOPSY N/A 05/22/2013   Procedure: BIOPSY TRANSRECTAL ULTRASONIC PROSTATE (TUBP);  Surgeon: Valetta Fuller, MD;  Location: [facility];  Service: Urology;  Laterality: N/A;   RIGHT/LEFT HEART CATH AND CORONARY ANGIOGRAPHY N/A 06/25/2020   Procedure: RIGHT/LEFT HEART CATH AND CORONARY ANGIOGRAPHY;  Surgeon: Lyn Records, MD;  Location: [facility] INVASIVE CV LAB;:; Normal coronary arteries; LVEDP 23 mmHg; PCWP 19 mmHg.   ROTATOR CUFF REPAIR Right 2016   SPACE OAR INSTILLATION N/A 07/23/2021   Procedure: SPACE OAR INSTILLATION;  Surgeon: Crist Fat, MD;  Location: [facility] ORS;  Service: Urology;  Laterality:  N/A;   THROAT SURGERY  04/20/2018   TRANSRECTAL ULTRASOUND PROSTATE BX  05-23-2005  &  04-19-2001   TRANSTHORACIC ECHOCARDIOGRAM  09/2017   EF 45-50 % (previously reported as 40 to 45%).  Incoordinate septal motion with mild LVH.  GR 1 DD.  Aortic sclerosis but no stenosis.   TRANSTHORACIC ECHOCARDIOGRAM  10/06/2020   Severely reduced EF of 20 to 25%.  No LV thrombus.  Severe septal-lateral wall systolic dyssynchrony due to LBBB.  Global HK.  Moderately dilated LV.  GR 1 DD with mild LA dilation..  Unable to assess PAP with normal RV size and function.  Normal RAP.  Mild AOV sclerosis but no stenosis.  Mild to moderate MR.   TRANSTHORACIC ECHOCARDIOGRAM  06/16/2020   Severely reduced EF <20%.  Moderate severely dilated LV.  GR 2 DD.  Elevated LVEDP.  Mild LA dilation.  Mildly reduced RV function.  Mild MR.  Mild aortic valve calcification     Family History  Problem Relation Age of Onset   Arthritis Mother    COPD Father    Lung cancer Father    Cancer Sister        type unknown   Breast cancer Sister    Lung cancer Sister    Cardiomyopathy Other        idiopathic. trivial disease 2004 cath, 2010 cardiac CT - no sig. dz, nl LV fxn. 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  Tobacco Use   Smoking status: Former    Current packs/day: 0.00    Average packs/day: 2.0 packs/day for 20.0 years (40.0 ttl pk-yrs)    Types: Cigarettes    Start date: 03/28/1960    Quit date: 03/28/1980    Years since quitting: 42.9   Smokeless tobacco: Never  Vaping Use   Vaping status: Never Used  Substance and Sexual Activity   Alcohol use: No    Alcohol/week: 0.0 standard drinks of alcohol   Drug use: No   Sexual activity: Not Currently  Other Topics Concern   Not on file  Social History Narrative   He walks on a relatively regular basis about 15 minutes at a time mostly to the discomfort. He previously had walked up a 30-40 minutes a time without problems. Education: [employer].   Lives alone.   Right-handed.   One cup coffee daily.   Social Determinants of Health   Financial Resource Strain: Low Risk  (12/01/2022)   Overall Financial Resource Strain (CARDIA)    Difficulty of Paying Living Expenses: Not hard at all  Food Insecurity: No Food Insecurity (12/01/2022)   Hunger Vital Sign    Worried About Running Out of Food in the Last Year: Never true    Ran Out of Food in the Last Year: Never true  Transportation Needs: No Transportation Needs (12/01/2022)   PRAPARE - Administrator, Civil Service (Medical): No    Lack of Transportation (Non-Medical): No  Physical Activity: Sufficiently Active (12/01/2022)   Exercise Vital Sign    Days of Exercise per Week: 5 days    Minutes of Exercise per Session: 30 min  Stress: No Stress Concern Present (12/01/2022)   [employer] of Occupational Health - Occupational Stress Questionnaire    Feeling of  Stress : Not at all  Social Connections: Moderately Isolated (12/01/2022)   Social Connection and Isolation Panel [NHANES]    Frequency of Communication with Friends and Family: More than three times a week    Frequency of Social Gatherings with Friends and Family: Three times a week    Attends Religious Services: More than 4 times per year    Active Member of Clubs or Organizations: No    Attends Banker Meetings: Never    Marital Status: Widowed  Intimate Partner Violence: Not At Risk (12/01/2022)   Humiliation, Afraid, Rape, and Kick questionnaire    Fear of Current or Ex-Partner: No    Emotionally Abused: No    Physically Abused: No    Sexually Abused: No     BP 122/68   Pulse 80   Ht 5' 8.5" (1.74 m)   Wt 190 lb (86.2 kg)   SpO2 97%   BMI 28.47 kg/m   Physical Exam:  Well appearing NAD HEENT: Unremarkable Neck:  No JVD, no thyromegally Lymphatics:  No adenopathy Back:  No CVA tenderness Lungs:  Clear HEART:  Regular rate rhythm, no murmurs, no rubs, no clicks Abd:  soft, positive bowel sounds, no organomegally, no rebound, no guarding Ext:  2 plus pulses, no edema, no cyanosis, no clubbing Skin:  No rashes no nodules Neuro:  CN II through XII intact, motor grossly intact  DEVICE  Normal device function.  See PaceArt for details.   Assess/Plan: Chronic systolic heart failure - his symptoms remain class 2. He will continue his current meds. His QRS is 30 ms wider than at implant. We will adjust this in the future if his symptoms worsen. ICD - his [employer] Biv ICD is working normally. No change.

## 2023-03-06 NOTE — Patient Instructions (Signed)

## 2023-03-07 ENCOUNTER — Ambulatory Visit (INDEPENDENT_AMBULATORY_CARE_PROVIDER_SITE_OTHER)
Admission: RE | Admit: 2023-03-07 | Discharge: 2023-03-07 | Disposition: A | Discharge status: Home / Self Care | Payer: Medicare HMO | Source: Ambulatory Visit | Attending: Family Medicine | Admitting: Family Medicine

## 2023-03-07 ENCOUNTER — Ambulatory Visit (INDEPENDENT_AMBULATORY_CARE_PROVIDER_SITE_OTHER): Payer: Medicare HMO | Admitting: Family Medicine

## 2023-03-07 VITALS — BP 112/60 | HR 82 | Temp 98.6°F | Resp 96 | Ht 68.5 in | Wt 192.4 lb

## 2023-03-07 DIAGNOSIS — R079 Chest pain, unspecified: Secondary | ICD-10-CM | POA: Diagnosis not present

## 2023-03-07 DIAGNOSIS — M79662 Pain in left lower leg: Secondary | ICD-10-CM

## 2023-03-07 DIAGNOSIS — M4316 Spondylolisthesis, lumbar region: Secondary | ICD-10-CM | POA: Diagnosis not present

## 2023-03-07 DIAGNOSIS — R0600 Dyspnea, unspecified: Secondary | ICD-10-CM | POA: Diagnosis not present

## 2023-03-07 DIAGNOSIS — E781 Pure hyperglyceridemia: Secondary | ICD-10-CM | POA: Diagnosis not present

## 2023-03-07 DIAGNOSIS — R0609 Other forms of dyspnea: Secondary | ICD-10-CM | POA: Diagnosis not present

## 2023-03-07 DIAGNOSIS — I5022 Chronic systolic (congestive) heart failure: Secondary | ICD-10-CM

## 2023-03-07 DIAGNOSIS — M5441 Lumbago with sciatica, right side: Secondary | ICD-10-CM

## 2023-03-07 DIAGNOSIS — M542 Cervicalgia: Secondary | ICD-10-CM | POA: Diagnosis not present

## 2023-03-07 MED ORDER — PREDNISONE 20 MG PO TABS: ORAL_TABLET | ORAL | 0 refills | Status: DC
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
## 2023-03-07 NOTE — Patient Instructions (Addendum)
If ddimer (blood clot screening test) is elevated, we can order an ultrasound and other test of your chest to rule out a blood clot, but that would be less likely cause. Will let you know plan once I receive results.   Please have chest xray today at : [facility] or xray: Walk in 8:30-4:30 during weekdays, no appointment needed [address]  [place], [place] [address]  If any worsening chest pain, or new shortness of breath or worse symptoms - be seen in ER.   We can try prednisone one more time to see if that will help neck and back issues, but I  did refer you to neurosurgeon to decide on other tests or treatments.  With previous elevated blood sugar, watch for high blood sugars, or symptoms of high blood sugar such as increasing thirst, increasing urinary frequency or blurry vision and be seen right away if that occurs.  As we discussed prednisone can sometimes increase blood sugar.  Prednisone was sent to local [facility], [facility]. You can also take an additional gabapentin (total dose 600mg ) at bedtime for now, and then after a few days can increase morning dose to 600mg  if persistent pain in the day. Let me know if we nee to increase that further.   Hang in there and follow-up with me in 1 month and we can review other chronic medications at that time, happy to see you sooner if needed.  Return to the clinic or go to the nearest emergency room if any of your symptoms worsen or new symptoms occur.

## 2023-03-08 LAB — COMPREHENSIVE METABOLIC PANEL
ALT: 15 U/L (ref 0–53)
AST: 16 U/L (ref 0–37)
Albumin: 4.4 g/dL (ref 3.5–5.2)
Alkaline Phosphatase: 58 U/L (ref 39–117)
BUN: 27 mg/dL — ABNORMAL HIGH (ref 6–23)
CO2: 27 meq/L (ref 19–32)
Calcium: 9 mg/dL (ref 8.4–10.5)
Chloride: 108 meq/L (ref 96–112)
Creatinine, Ser: 1.2 mg/dL (ref 0.40–1.50)
GFR: 57.78 mL/min — ABNORMAL LOW (ref >60.00)
Glucose, Bld: 103 mg/dL — ABNORMAL HIGH (ref 70–99)
Potassium: 4.7 meq/L (ref 3.5–5.1)
Sodium: 141 meq/L (ref 135–145)
Total Bilirubin: 0.4 mg/dL (ref 0.2–1.2)
Total Protein: 7.1 g/dL (ref 6.0–8.3)

## 2023-03-08 LAB — D-DIMER, QUANTITATIVE: D-Dimer, Quant: 0.37 ug{FEU}/mL (ref <0.50)

## 2023-03-08 LAB — LIPID PANEL
Cholesterol: 147 mg/dL (ref 0–200)
HDL: 45.8 mg/dL (ref >39.00)
LDL Cholesterol: 86 mg/dL (ref 0–99)
NonHDL: 101.29
Total CHOL/HDL Ratio: 3
Triglycerides: 78 mg/dL (ref 0.0–149.0)
VLDL: 15.6 mg/dL (ref 0.0–40.0)

## 2023-03-09 ENCOUNTER — Ambulatory Visit: Payer: Medicare HMO | Admitting: Family Medicine

## 2023-03-09 ENCOUNTER — Encounter: Payer: Self-pay | Admitting: Family Medicine

## 2023-03-11 LAB — CUP PACEART INCLINIC DEVICE CHECK
Battery Remaining Longevity: 69 mo
Brady Statistic RA Percent Paced: 2.6 %
Brady Statistic RV Percent Paced: 96 %
Date Time Interrogation Session: 20241209151200
HighPow Impedance: 73.125
Implantable Lead Connection Status: 753985
Implantable Lead Connection Status: 753985
Implantable Lead Connection Status: 753985
Implantable Lead Implant Date: 20230802
Implantable Lead Implant Date: 20230802
Implantable Lead Implant Date: 20230802
Implantable Lead Location: 753859
Implantable Lead Location: 753860
Implantable Lead Location: 753860
Implantable Lead Model: 7122
Implantable Pulse Generator Implant Date: 20230802
Lead Channel Impedance Value: 275 Ohm
Lead Channel Impedance Value: 400 Ohm
Lead Channel Impedance Value: 412.5 Ohm
Lead Channel Pacing Threshold Amplitude: 0.5 V
Lead Channel Pacing Threshold Amplitude: 0.5 V
Lead Channel Pacing Threshold Amplitude: 1 V
Lead Channel Pacing Threshold Amplitude: 1 V
Lead Channel Pacing Threshold Amplitude: 1.25 V
Lead Channel Pacing Threshold Amplitude: 1.25 V
Lead Channel Pacing Threshold Pulse Width: 0.5 ms
Lead Channel Pacing Threshold Pulse Width: 0.5 ms
Lead Channel Pacing Threshold Pulse Width: 0.5 ms
Lead Channel Pacing Threshold Pulse Width: 0.5 ms
Lead Channel Pacing Threshold Pulse Width: 0.5 ms
Lead Channel Pacing Threshold Pulse Width: 0.5 ms
Lead Channel Sensing Intrinsic Amplitude: 11.3 mV
Lead Channel Sensing Intrinsic Amplitude: 4.7 mV
Lead Channel Setting Pacing Amplitude: 0.25 V
Lead Channel Setting Pacing Amplitude: 2 V
Lead Channel Setting Pacing Amplitude: 2 V
Lead Channel Setting Pacing Pulse Width: 0.05 ms
Lead Channel Setting Pacing Pulse Width: 0.5 ms
Lead Channel Setting Sensing Sensitivity: 0.5 mV
Pulse Gen Serial Number: 5544603

## 2023-03-30 DIAGNOSIS — L0201 Cutaneous abscess of face: Secondary | ICD-10-CM | POA: Diagnosis not present

## 2023-03-30 DIAGNOSIS — R202 Paresthesia of skin: Secondary | ICD-10-CM | POA: Diagnosis not present

## 2023-03-31 ENCOUNTER — Encounter: Payer: Self-pay | Admitting: Internal Medicine
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
## 2023-04-10 ENCOUNTER — Ambulatory Visit: Payer: Medicare HMO | Attending: Cardiology | Admitting: Cardiology

## 2023-04-10 ENCOUNTER — Encounter: Payer: Self-pay | Admitting: Cardiology

## 2023-04-10 ENCOUNTER — Encounter: Payer: Self-pay | Admitting: Family Medicine

## 2023-04-10 ENCOUNTER — Ambulatory Visit: Payer: Medicare HMO | Admitting: Family Medicine

## 2023-04-10 VITALS — BP 124/62 | HR 90 | Temp 98.7°F | Ht 68.5 in | Wt 192.0 lb

## 2023-04-10 VITALS — BP 126/70 | HR 92 | Ht 68.0 in | Wt 193.8 lb

## 2023-04-10 DIAGNOSIS — I4891 Unspecified atrial fibrillation: Secondary | ICD-10-CM | POA: Diagnosis not present

## 2023-04-10 DIAGNOSIS — R0609 Other forms of dyspnea: Secondary | ICD-10-CM

## 2023-04-10 DIAGNOSIS — R0789 Other chest pain: Secondary | ICD-10-CM

## 2023-04-10 DIAGNOSIS — M5441 Lumbago with sciatica, right side: Secondary | ICD-10-CM | POA: Diagnosis not present

## 2023-04-10 DIAGNOSIS — R079 Chest pain, unspecified: Secondary | ICD-10-CM | POA: Diagnosis not present

## 2023-04-10 DIAGNOSIS — M542 Cervicalgia: Secondary | ICD-10-CM

## 2023-04-10 DIAGNOSIS — Z95810 Presence of automatic (implantable) cardiac defibrillator: Secondary | ICD-10-CM | POA: Diagnosis not present

## 2023-04-10 DIAGNOSIS — I5022 Chronic systolic (congestive) heart failure: Secondary | ICD-10-CM

## 2023-04-10 DIAGNOSIS — E785 Hyperlipidemia, unspecified: Secondary | ICD-10-CM

## 2023-04-10 DIAGNOSIS — M4316 Spondylolisthesis, lumbar region: Secondary | ICD-10-CM

## 2023-04-10 DIAGNOSIS — I428 Other cardiomyopathies: Secondary | ICD-10-CM

## 2023-04-10 NOTE — Progress Notes (Signed)
 Subjective:  Patient ID: [name] [name], male    DOB: Oct 25, 1945  Age: 78 y.o. MRN: [id number]  CC:  Chief Complaint  Patient presents with   Neck Pain    Pt notes he needs a letter to post carrier to get them to place mail closer to home as it is unsafe for patient to go to mailbox by the road, pt would also like to discuss neck stiffness notes after finishing prednisone  it went back to how it was previous to that treatment,    Chest Pain    Pt notes continued chest pain from the last time but pt is seeing cardiology today at 11 am notes he just wants this documented     HPI [name] [name] presents for   Chest pain Evaluated for this in November than December. D-dimer negative in December, unlikely DVT/PE, possible referred pain from cervical spine.  Referred to neurosurgery at that time and given prednisone  for short-term relief.  Still has some intermittent chest pain and will be meeting with cardiology today.  History of nonischemic cardiomyopathy, treated with Lasix  and followed by advanced heart failure clinic.  Followed by electrophysiology with history of Biv ICD.  He is requesting a letter to have the postal carrier deliver mail closer to his home.  Difficulty to go out to the mailbox by the road due back and neck pain, trouble moving neck to look at traffic as next to road and cars coming quick. Advised by postal service that if has letter can deliver in driveway.   Still some similar intermittent pain in R and L side of chest. Left side is sharp pain. Comes and goes. Dull pain at at times. Occasionally with neck pain. Sore with lying down.   Neck pain See previous notes.  History of cervical spine surgery in 2020, lumbar spine surgery previously, moderate degenerative changes in L4-5 and L5-S1 with trace listhesis on previous imaging of lumbar spine November.  Treated with gabapentin  with variable dosing to help manage his symptoms.  Treated with prednisone  in December  with plan for neurosurgery follow-up.  Referred back to Dr. Alm Molt. Appt with Dr. Molt this Thursday. Still some back pain at times.  Prednisone  helped when on it, but then pain returned off meds.  Taking gabapentin  2 times per day, sometimes 2 at night - sometimes 2 at night - not sure if it helps.    History Patient Active Problem List   Diagnosis Date Noted   Fatigue 08/17/2022   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   Hx of colonic polyps 01/10/2022   Atrial fibrillation, transient (HCC) 11/02/2021   Hypotension    Chest pain 10/28/2021   Prostate cancer (HCC) 04/02/2021   Chronic HFrEF (heart failure with reduced ejection fraction) (HCC) 05/20/2020   Claudication (HCC) 09/01/2019   S/P cervical spinal fusion 04/20/2018   Paresthesia 01/10/2018   Neck pain 01/10/2018   LBBB (left bundle branch block) 09/07/2017   Other meniscus derangements, posterior horn of medial meniscus, left knee, insufficency fracture medial tibial plateau 04/07/2017   Closed nondisplaced fracture of lateral condyle of left tibia with routine healing 04/07/2017   Status post arthroscopy of left knee 04/07/2017   Insomnia 09/14/2015   Hyperlipidemia with target LDL less than 100 09/11/2014   Rotator cuff tendinitis 04/28/2014   Obesity (BMI 30-39.9) 12/29/2013   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   Exertional shortness of breath 06/26/2012   Polyp of colon, adenomatous 04/27/2012  Elevated PSA 10/20/2011   Hypertriglyceridemia 07/08/2011   Nonischemic cardiomyopathy (HCC) 09/04/2009   Past Medical History:  Diagnosis Date   Arthritis    BPH (benign prostatic hypertrophy)    CHF (congestive heart failure) (HCC)    Elevated PSA    Eye problem 03/06/2016   Dr. Etta retinal detachment left eye, no sx done   History of gout    pt 05-20-2013 states stable    History of melanoma excision    2011-- LEFT UPPER BACK   Hyperlipemia    Hypertriglyceridemia     ICD (implantable cardioverter-defibrillator) battery depletion 10/27/2021   Left bundle branch block 08/2017   Concern for LBBB mediated cardiomyopathy   Melanoma (HCC) 2011   back   Nonischemic cardiomyopathy (HCC) 09/2020   08/2009: EF 40-45%, Global HK-> 09/2017 -> EF 45-50% ; 3/'22: EF<20%.  Elevated LVEDP.;  09/2020: EF 20-25%.  GR 1 DD   Numbness    hands   Personal history of arthritis    Personal history of other diseases of circulatory system    Pre-diabetes    Sickle cell anemia (HCC)    Past Surgical History:  Procedure Laterality Date   ANTERIOR CERVICAL DECOMP/DISCECTOMY FUSION N/A 04/20/2018   Procedure: Anterior Cervical Decompression Fusion - Cervical Three-Cervical Four - Cervical Four-Cervical Five - Cervical Five-Cervical Six;  Surgeon: Joshua Alm RAMAN, MD;  [facility] OR;  Service: Neurosurgery;  Laterality: N/A;  Anterior Cervical Decompression Fusion - Cervical Three-Cervical Four - Cervical Four-Cervical Five - Cervical Five-Cervical Six   APPENDECTOMY  AS CHILD   BACK SURGERY     BIV ICD INSERTION CRT-D N/A 10/27/2021   Procedure: BIV ICD INSERTION CRT-D;  Surgeon: Waddell Danelle ORN, MD;  Location: [facility] INVASIVE CV LAB;  Service: Cardiovascular;  Laterality: N/A;   CARDIOVASCULAR STRESS TEST  10/05/1998   MILD GLOBAL HYPOKINESIS AND ISCHEMIAIN ANTEROSEPTAL  AT [facility]/ EF 42%   CARPAL TUNNEL RELEASE Right    CENTRAL LINE INSERTION  10/28/2021   Procedure: CENTRAL LINE INSERTION;  Surgeon: Inocencio Soyla Lunger, MD;  Location: [facility] INVASIVE CV LAB;  Service: Cardiovascular;;   CENTRAL LINE INSERTION  10/28/2021   Procedure: CENTRAL LINE INSERTION;  Surgeon: Wonda Sharper, MD;  Location: [place] INVASIVE CV LAB;  Service: Cardiovascular;;   COLONOSCOPY WITH PROPOFOL  N/A 03/14/2022   Procedure: COLONOSCOPY WITH PROPOFOL ;  Surgeon: Shila Gustav GAILS, MD;  Location: [facility] ENDOSCOPY;  Service: Gastroenterology;  Laterality: N/A;   EXCISION MELANOMA LEFT UPPER BACK  10/14/2009   eye lid  surgery Bilateral 2018   GOLD SEED IMPLANT N/A 07/23/2021   Procedure: GOLD SEED IMPLANT;  Surgeon: Cam Morene ORN, MD;  Location: [facility] ORS;  Service: Urology;  Laterality: N/A;   KNEE ARTHROSCOPY WITH SUBCHONDROPLASTY Left 04/07/2017   Procedure: LEFT KNEE ARTHROSCOPY WITH PARTIAL MEDIAL MENISCECTOMY AND MEDIAL TIBIAL SUBCHONDROPLASTY;  Surgeon: Vernetta Lonni GRADE, MD;  Location: [facility] ORS;  Service: Orthopedics;  Laterality: Left;   knee injections Bilateral    LEAD REVISION/REPAIR N/A 10/28/2021   Procedure: LEAD REVISION/REPAIR;  Surgeon: Inocencio Soyla Lunger, MD;  Location: [facility] INVASIVE CV LAB;  Service: Cardiovascular;  Laterality: N/A;   LEFT HEART CATH AND CORONARY ANGIOGRAPHY  04/2002   minimal irregularities in the LAD/  EF 50%  -   DR AL LITTLE   LUMBAR DISC SURGERY  09/12/2000   left  L5 -- S1   PERICARDIOCENTESIS N/A 10/28/2021   Procedure: PERICARDIOCENTESIS;  Surgeon: Inocencio Soyla Lunger, MD;  Location: [facility] INVASIVE CV LAB;  Service: Cardiovascular;  Laterality: N/A;   PERICARDIOCENTESIS N/A 10/28/2021   Procedure: PERICARDIOCENTESIS;  Surgeon: Wonda Sharper, MD;  Location: [facility] INVASIVE CV LAB;  Service: Cardiovascular;  Laterality: N/A;   PROSTATE BIOPSY N/A 05/22/2013   Procedure: BIOPSY TRANSRECTAL ULTRASONIC PROSTATE (TUBP);  Surgeon: Alm GORMAN Fragmin, MD;  Location: [facility];  Service: Urology;  Laterality: N/A;   RIGHT/LEFT HEART CATH AND CORONARY ANGIOGRAPHY N/A 06/25/2020   Procedure: RIGHT/LEFT HEART CATH AND CORONARY ANGIOGRAPHY;  Surgeon: Claudene Victory ORN, MD;  Location: [facility] INVASIVE CV LAB;:; Normal coronary arteries; LVEDP 23 mmHg; PCWP 19 mmHg.   ROTATOR CUFF REPAIR Right 2016   SPACE OAR INSTILLATION N/A 07/23/2021   Procedure: SPACE OAR INSTILLATION;  Surgeon: Cam Morene ORN, MD;  Location: [facility] ORS;  Service: Urology;  Laterality: N/A;   THROAT SURGERY  04/20/2018   TRANSRECTAL ULTRASOUND PROSTATE BX  05-23-2005  &  04-19-2001   TRANSTHORACIC  ECHOCARDIOGRAM  09/2017   EF 45-50 % (previously reported as 40 to 45%).  Incoordinate septal motion with mild LVH.  GR 1 DD.  Aortic sclerosis but no stenosis.   TRANSTHORACIC ECHOCARDIOGRAM  10/06/2020   Severely reduced EF of 20 to 25%.  No LV thrombus.  Severe septal-lateral wall systolic dyssynchrony due to LBBB.  Global HK.  Moderately dilated LV.  GR 1 DD with mild LA dilation..  Unable to assess PAP with normal RV size and function.  Normal RAP.  Mild AOV sclerosis but no stenosis.  Mild to moderate MR.   TRANSTHORACIC ECHOCARDIOGRAM  06/16/2020   Severely reduced EF <20%.  Moderate severely dilated LV.  GR 2 DD.  Elevated LVEDP.  Mild LA dilation.  Mildly reduced RV function.  Mild MR.  Mild aortic valve calcification   Allergies  Allergen Reactions   Nitrostat [Nitroglycerin] Anaphylaxis    Cardiologist suggested he shouldn't take this med because it could kill him with his heart condition. Lowers BP   Ace Inhibitors Swelling   Mevacor  [Lovastatin ] Nausea Only   Solarcaine [Benzocaine] Dermatitis   Prior to Admission medications   Medication Sig Start Date End Date Taking? Authorizing Provider  acetaminophen  (TYLENOL ) 500 MG tablet Take 1,000 mg by mouth every 6 (six) hours as needed for moderate pain.   Yes [provider]  aspirin  EC 81 MG tablet Take 1 tablet (81 mg total) by mouth daily. 04/26/18  Yes Costella, Jerrell PARAS, PA-C  colchicine  0.6 MG tablet TAKE 1 TABLET TWICE DAILY 12/27/22  Yes Levora Reyes SAUNDERS, MD  Cyanocobalamin (VITAMIN B-12 PO) Take 5,000 mcg by mouth daily.   Yes [provider]  finasteride  (PROSCAR ) 5 MG tablet Take 5 mg by mouth daily.   Yes [provider]  FLUZONE HIGH-DOSE 0.5 ML injection Inject 0.5 mLs into the muscle once. 12/27/22  Yes [provider]  furosemide  (LASIX ) 40 MG tablet TAKE 1 TABLET TWICE DAILY Patient taking differently: Take 40 mg by mouth 2 (two) times a week. 01/10/22  Yes Anner Alm ORN, MD   gabapentin  (NEURONTIN ) 300 MG capsule Take 1-2 capsules (300-600 mg total) by mouth 3 (three) times daily as needed (pain). 02/27/23  Yes Levora Reyes SAUNDERS, MD  MAGNESIUM  PO Take 400 mg by mouth in the morning.   Yes [provider]  Multiple Vitamin (MULTIVITAMIN WITH MINERALS) TABS tablet Take 1 tablet by mouth daily. One-A-Day Men Health Multivitamin   Yes [provider]  naproxen  sodium (ALEVE ) 220 MG tablet Take 440 mg by mouth daily  as needed (pain).   Yes [provider]  Omega-3 Fatty Acids (FISH OIL PO) Take 1,000 mg by mouth in the morning.   Yes [provider]  predniSONE  (DELTASONE ) 20 MG tablet 3 by mouth for 3 days, then 2 by mouth for 2 days, then 1 by mouth for 2 days, then 1/2 by mouth for 2 days. 03/07/23  Yes Levora Reyes SAUNDERS, MD  rosuvastatin  (CRESTOR ) 5 MG tablet Take 1 tablet (5 mg total) by mouth every Monday, Wednesday, and Friday. 01/25/23 04/25/23 Yes West, Katlyn D, NP  tamsulosin  (FLOMAX ) 0.4 MG CAPS capsule TAKE 1 CAPSULE EVERY DAY 08/04/22  Yes Levora Reyes SAUNDERS, MD  Turmeric (QC TUMERIC COMPLEX PO) Take 1 tablet by mouth in the morning.   Yes [provider]  hydrALAZINE  (APRESOLINE ) 50 MG tablet Take 1 tablet (50 mg total) by mouth in the morning and at bedtime. 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  Tobacco Use   Smoking status: Former    Current packs/day: 0.00    Average packs/day: 2.0 packs/day for 20.0 years (40.0 ttl pk-yrs)    Types: Cigarettes    Start date: 03/28/1960    Quit date: 03/28/1980    Years since quitting: 43.0   Smokeless tobacco: Never  Vaping Use   Vaping status: Never Used  Substance and Sexual  Activity   Alcohol  use: No    Alcohol /week: 0.0 standard drinks of alcohol    Drug use: No   Sexual activity: Not Currently  Other Topics Concern   Not on file  Social History Narrative   He walks on a relatively regular basis about 15 minutes at a time mostly to the discomfort. He previously had walked up a 30-40 minutes a time without problems. Education: [employer].   Lives alone.   Right-handed.   One cup coffee daily.   Social Drivers of Corporate Investment Banker Strain: Low Risk  (12/01/2022)   Overall Financial Resource Strain (CARDIA)    Difficulty of Paying Living Expenses: Not hard at all  Food Insecurity: No Food Insecurity (12/01/2022)   Hunger Vital Sign    Worried About Running Out of Food in the Last Year: Never true    Ran Out of Food in the Last Year: Never true  Transportation Needs: No Transportation Needs (12/01/2022)   PRAPARE - Administrator, Civil Service (Medical): No    Lack of Transportation (Non-Medical): No  Physical Activity: Sufficiently Active (12/01/2022)   Exercise Vital Sign    Days of Exercise per Week: 5 days    Minutes of Exercise per Session: 30 min  Stress: No Stress Concern Present (12/01/2022)   [employer] of Occupational Health - Occupational Stress Questionnaire    Feeling of Stress : Not at all  Social Connections: Moderately Isolated (12/01/2022)   Social Connection and Isolation Panel [NHANES]    Frequency of Communication with Friends and Family: More than three times a week    Frequency of Social Gatherings with Friends and Family: Three times a week    Attends Religious Services: More than 4 times per year    Active Member of  Clubs or Organizations: No    Attends Banker Meetings: Never    Marital Status: Widowed  Intimate Partner Violence: Not At Risk (12/01/2022)   Humiliation, Afraid, Rape, and Kick questionnaire    Fear of Current or Ex-Partner: No    Emotionally Abused: No    Physically Abused: No     Sexually Abused: No    Review of Systems  Per HPI Objective:   Vitals:   04/10/23 0911  BP: 124/62  Pulse: 90  Temp: 98.7 F (37.1 C)  TempSrc: Temporal  SpO2: 99%  Weight: 192 lb (87.1 kg)  Height: 5' 8.5 (1.74 m)     Physical Exam Vitals reviewed.  Constitutional:      Appearance: He is well-developed.  HENT:     Head: Normocephalic and atraumatic.  Neck:     Vascular: No carotid bruit or JVD.  Cardiovascular:     Rate and Rhythm: Normal rate and regular rhythm.     Heart sounds: Normal heart sounds. No murmur heard. Pulmonary:     Effort: Pulmonary effort is normal.     Breath sounds: Normal breath sounds. No rales.  Musculoskeletal:     Right lower leg: No edema.     Left lower leg: No edema.     Comments: C-spine, minimal rotation, approximately 10 to 15 degrees right and left.  Minimal flexion, minimal extension.  Negative seated straight leg raise, ambulating without assistive device.  Skin:    General: Skin is warm and dry.  Neurological:     Mental Status: He is alert and oriented to person, place, and time.  Psychiatric:        Mood and Affect: Mood normal.     Assessment & Plan:  [name] [name] is a 78 y.o. male . Right-sided chest pain DOE (dyspnea on exertion)Chronic HFrEF (heart failure with reduced ejection fraction) (HCC) - -Intermittent bilateral chest pain, previous evaluation as above.  Follow-up with cardiology today.  Question component of cervical spine disease but with his history of CHF close follow-up with cardiology planned.  Neck pain Midline low back pain with right-sided sciatica, unspecified chronicity Spondylolisthesis of lumbar region  -Unfortunately only temporary relief of neck pain with prednisone .  As tolerated 2 gabapentin  at bedtime, recommend he continue on that dose consistently with option to add additional morning dosing, but anticipate plan to be discussed with his neurosurgeon this week.  Could also  consider meeting with pain management specialist for improved pain control.  He will let me know.  10-month follow-up.  RTC precautions if any new or worsening symptoms sooner   No orders of the defined types were placed in this encounter.  Patient Instructions  Take 2 gabapentin  at bedtime nightly, then after 1 week can increase to 2 twice per day if still having pain in the morning. Other treatments can be discussed with your spine specialist.   Discuss the chest pain with cardiology today. That could potentially be coming form your neck as well, but discuss symptoms with cardiology today.        Signed,   Reyes Pines, MD [facility], [facility] 04/10/23 10:11 AM

## 2023-04-10 NOTE — Patient Instructions (Signed)
 Medication Instructions:  No changes *If you need a refill on your cardiac medications before your next appointment, please call your pharmacy*  Lab Work: No labs  Testing/Procedures: No testing  Follow-Up: At [facility], you and your health needs are our priority.  As part of our continuing mission to provide you with exceptional heart care, we have created designated Provider Care Teams.  These Care Teams include your primary Cardiologist (physician) and Advanced Practice Providers (APPs -  Physician Assistants and Nurse Practitioners) who all work together to provide you with the care you need, when you need it.  We recommend signing up for the patient portal called "MyChart".  Sign up information is provided on this After Visit Summary.  MyChart is used to connect with patients for Virtual Visits (Telemedicine).  Patients are able to view lab/test results, encounter notes, upcoming appointments, etc.  Non-urgent messages can be sent to your provider as well.   To learn more about what you can do with MyChart, go to [email or web address].    Your next appointment:   3 month(s)  Provider:   Bryan Lemma, MD

## 2023-04-10 NOTE — Patient Instructions (Addendum)
 Take 2 gabapentin  at bedtime nightly, then after 1 week can increase to 2 twice per day if still having pain in the morning. Other treatments can be discussed with your spine specialist.   Discuss the chest pain with cardiology today. That could potentially be coming fromm your neck as well, but discuss symptoms with cardiology today.   Return to the clinic or go to the nearest emergency room if any of your symptoms worsen or new symptoms occur.

## 2023-04-12 DIAGNOSIS — D492 Neoplasm of unspecified behavior of bone, soft tissue, and skin: Secondary | ICD-10-CM | POA: Diagnosis not present

## 2023-04-12 DIAGNOSIS — C44329 Squamous cell carcinoma of skin of other parts of face: Secondary | ICD-10-CM | POA: Diagnosis not present

## 2023-04-13 DIAGNOSIS — M542 Cervicalgia: Secondary | ICD-10-CM | POA: Diagnosis not present

## 2023-04-13 DIAGNOSIS — Z6829 Body mass index (BMI) 29.0-29.9, adult: Secondary | ICD-10-CM | POA: Diagnosis not present

## 2023-04-19 DIAGNOSIS — S161XXD Strain of muscle, fascia and tendon at neck level, subsequent encounter: Secondary | ICD-10-CM | POA: Diagnosis not present

## 2023-04-26 DIAGNOSIS — S161XXD Strain of muscle, fascia and tendon at neck level, subsequent encounter: Secondary | ICD-10-CM | POA: Diagnosis not present

## 2023-04-27 ENCOUNTER — Ambulatory Visit (INDEPENDENT_AMBULATORY_CARE_PROVIDER_SITE_OTHER): Payer: Medicare HMO

## 2023-04-27 DIAGNOSIS — I428 Other cardiomyopathies: Secondary | ICD-10-CM

## 2023-04-27 LAB — CUP PACEART REMOTE DEVICE CHECK
Battery Remaining Longevity: 67 mo
Battery Remaining Percentage: 75 %
Battery Voltage: 2.99 V
Brady Statistic AP VP Percent: 1 %
Brady Statistic AP VS Percent: 1 %
Brady Statistic AS VP Percent: 94 %
Brady Statistic AS VS Percent: 5.3 %
Brady Statistic RA Percent Paced: 1 %
Date Time Interrogation Session: 20250130020016
HighPow Impedance: 70 Ohm
HighPow Impedance: 70 Ohm
Implantable Lead Connection Status: 753985
Implantable Lead Connection Status: 753985
Implantable Lead Connection Status: 753985
Implantable Lead Implant Date: 20230802
Implantable Lead Implant Date: 20230802
Implantable Lead Implant Date: 20230802
Implantable Lead Location: 753859
Implantable Lead Location: 753860
Implantable Lead Location: 753860
Implantable Lead Model: 7122
Implantable Pulse Generator Implant Date: 20230802
Lead Channel Impedance Value: 260 Ohm
Lead Channel Impedance Value: 390 Ohm
Lead Channel Impedance Value: 400 Ohm
Lead Channel Pacing Threshold Amplitude: 0.5 V
Lead Channel Pacing Threshold Amplitude: 1 V
Lead Channel Pacing Threshold Amplitude: 1.25 V
Lead Channel Pacing Threshold Pulse Width: 0.5 ms
Lead Channel Pacing Threshold Pulse Width: 0.5 ms
Lead Channel Pacing Threshold Pulse Width: 0.5 ms
Lead Channel Sensing Intrinsic Amplitude: 11.3 mV
Lead Channel Sensing Intrinsic Amplitude: 4.2 mV
Lead Channel Setting Pacing Amplitude: 0.25 V
Lead Channel Setting Pacing Amplitude: 2 V
Lead Channel Setting Pacing Amplitude: 2 V
Lead Channel Setting Pacing Pulse Width: 0.05 ms
Lead Channel Setting Pacing Pulse Width: 0.5 ms
Lead Channel Setting Sensing Sensitivity: 0.5 mV
Pulse Gen Serial Number: 5544603

## 2023-05-01 ENCOUNTER — Encounter: Payer: Self-pay | Admitting: Internal Medicine

## 2023-05-03 DIAGNOSIS — S161XXD Strain of muscle, fascia and tendon at neck level, subsequent encounter: Secondary | ICD-10-CM | POA: Diagnosis not present

## 2023-05-17 DIAGNOSIS — L538 Other specified erythematous conditions: Secondary | ICD-10-CM | POA: Diagnosis not present

## 2023-05-17 DIAGNOSIS — D225 Melanocytic nevi of trunk: Secondary | ICD-10-CM | POA: Diagnosis not present

## 2023-05-17 DIAGNOSIS — L821 Other seborrheic keratosis: Secondary | ICD-10-CM | POA: Diagnosis not present

## 2023-05-17 DIAGNOSIS — L570 Actinic keratosis: Secondary | ICD-10-CM | POA: Diagnosis not present

## 2023-05-17 DIAGNOSIS — L820 Inflamed seborrheic keratosis: Secondary | ICD-10-CM | POA: Diagnosis not present

## 2023-05-17 DIAGNOSIS — L814 Other melanin hyperpigmentation: Secondary | ICD-10-CM | POA: Diagnosis not present

## 2023-05-17 DIAGNOSIS — L578 Other skin changes due to chronic exposure to nonionizing radiation: Secondary | ICD-10-CM | POA: Diagnosis not present

## 2023-05-17 DIAGNOSIS — Z08 Encounter for follow-up examination after completed treatment for malignant neoplasm: Secondary | ICD-10-CM | POA: Diagnosis not present

## 2023-05-17 DIAGNOSIS — Z85820 Personal history of malignant melanoma of skin: Secondary | ICD-10-CM | POA: Diagnosis not present

## 2023-05-18 DIAGNOSIS — C44329 Squamous cell carcinoma of skin of other parts of face: Secondary | ICD-10-CM | POA: Diagnosis not present

## 2023-05-18 DIAGNOSIS — D485 Neoplasm of uncertain behavior of skin: Secondary | ICD-10-CM | POA: Diagnosis not present

## 2023-05-24 ENCOUNTER — Ambulatory Visit (INDEPENDENT_AMBULATORY_CARE_PROVIDER_SITE_OTHER): Payer: Medicare HMO | Admitting: Family Medicine

## 2023-05-24 ENCOUNTER — Ambulatory Visit (INDEPENDENT_AMBULATORY_CARE_PROVIDER_SITE_OTHER)
Admission: RE | Admit: 2023-05-24 | Discharge: 2023-05-24 | Disposition: A | Discharge status: Home / Self Care | Payer: Medicare HMO | Source: Ambulatory Visit | Attending: Family Medicine | Admitting: Family Medicine

## 2023-05-24 ENCOUNTER — Encounter: Payer: Self-pay | Admitting: Family Medicine

## 2023-05-24 VITALS — BP 124/60 | HR 93 | Temp 98.4°F | Ht 68.0 in | Wt 197.2 lb

## 2023-05-24 DIAGNOSIS — M4316 Spondylolisthesis, lumbar region: Secondary | ICD-10-CM | POA: Diagnosis not present

## 2023-05-24 DIAGNOSIS — M79605 Pain in left leg: Secondary | ICD-10-CM

## 2023-05-24 DIAGNOSIS — M5441 Lumbago with sciatica, right side: Secondary | ICD-10-CM

## 2023-05-24 DIAGNOSIS — M542 Cervicalgia: Secondary | ICD-10-CM

## 2023-05-24 DIAGNOSIS — M47816 Spondylosis without myelopathy or radiculopathy, lumbar region: Secondary | ICD-10-CM | POA: Diagnosis not present

## 2023-05-24 DIAGNOSIS — M545 Low back pain, unspecified: Secondary | ICD-10-CM | POA: Diagnosis not present

## 2023-05-24 DIAGNOSIS — M48061 Spinal stenosis, lumbar region without neurogenic claudication: Secondary | ICD-10-CM | POA: Diagnosis not present

## 2023-05-24 DIAGNOSIS — M79604 Pain in right leg: Secondary | ICD-10-CM

## 2023-05-24 DIAGNOSIS — H6122 Impacted cerumen, left ear: Secondary | ICD-10-CM
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
## 2023-05-24 NOTE — Patient Instructions (Addendum)
 Try taking 1 gabapentin at lunch. If pain in feet is not improving in 4-5 days, then take additional gabapentin at bedtime (3 pills).  After another 4-5 days you can add a dose in the morning as well if needed (3 pills). If any lightheadedness, dizziness or feel worse on higher dose of gabapentin, return to prior dose.   Please have xray of back today.  [facility] or xray: Walk in 8:30-4:30 during weekdays, no appointment needed [address]  [place], [place] [address]  Follow up in 2 weeks to decide on other changes and see if you are ready to return to therapy.   Left ear noise may be due to a small amount of wax that was seen on exam.  It is not completely obstructed in the canal so I do not think we need to treated here in the office but if you want to try over-the-counter Debrox for earwax removal, that is fine.  Return to the clinic or go to the nearest emergency room if any of your symptoms worsen or new symptoms occur.

## 2023-05-27 DEATH — deceased

## 2023-05-30 ENCOUNTER — Telehealth: Payer: Self-pay

## 2023-05-30 NOTE — Telephone Encounter (Signed)
 The pt states he was returning a call. I gave him the number to the [facility] office.

## 2023-05-30 NOTE — Telephone Encounter (Signed)
 Overall good news on x-ray. No evidence of lumbar spine fracture. There was some disc narrowing and some chronic appearing changes but again no acute findings. Try the medication changes we discussed at the visit and let me know how you are doing.

## 2023-05-31 NOTE — Telephone Encounter (Signed)
 Patient has been informed.

## 2023-05-31 NOTE — Telephone Encounter (Signed)
 He will reach out to both Cardiology as well as neurosurgeon and find out their thoughts, will follow up next week Wednesday as scheduled!

## 2023-05-31 NOTE — Telephone Encounter (Signed)
 Should follow-up with cardiology, but previous chest pain thought to be more muscular, noncardiac.  Could be related to cervical spine.  Concern he follow-up with his neurosurgeon if needed to discuss other treatment options.  If any new or worsening pain should be seen.  Thanks.

## 2023-05-31 NOTE — Telephone Encounter (Signed)
 Patient is concerned theres no answer to his chest pain and wonders if there is something more that can be looked into pt notes had another episode on Sunday of the same pains he had in the office

## 2023-06-04 NOTE — Progress Notes (Signed)
 Remote ICD transmission.

## 2023-06-07 ENCOUNTER — Ambulatory Visit: Admitting: Family Medicine

## 2023-06-07 ENCOUNTER — Encounter: Payer: Self-pay | Admitting: Family Medicine

## 2023-06-07 VITALS — BP 124/70 | HR 81 | Temp 98.4°F | Ht 68.0 in | Wt 200.0 lb

## 2023-06-07 DIAGNOSIS — M79604 Pain in right leg: Secondary | ICD-10-CM

## 2023-06-07 DIAGNOSIS — H6122 Impacted cerumen, left ear: Secondary | ICD-10-CM

## 2023-06-07 DIAGNOSIS — M545 Low back pain, unspecified: Secondary | ICD-10-CM | POA: Diagnosis not present

## 2023-06-07 DIAGNOSIS — T161XXA Foreign body in right ear, initial encounter: Secondary | ICD-10-CM | POA: Diagnosis not present

## 2023-06-07 DIAGNOSIS — M79605 Pain in left leg: Secondary | ICD-10-CM

## 2023-06-07 DIAGNOSIS — C44329 Squamous cell carcinoma of skin of other parts of face: Secondary | ICD-10-CM | POA: Diagnosis not present

## 2023-06-07 NOTE — Progress Notes (Unsigned)
 Subjective:  Patient ID: [name] [name], male    DOB: June 02, 1945  Age: 78 y.o. MRN: [id number]  CC:  Chief Complaint  Patient presents with   Back Pain    2 week recheck, patient notes still present but somewhat better    Leg Pain    2 week recheck pt notes okay, still present    Ear Fullness    Would like his ears cleaned out, thinks a Qtip and a cotton ball stuck in each ear.      HPI [name] presents for  Multiple concerns above  Midline low back pain with sciatica Discussed at February 26 visit.  Chronic neck and back pain with history of cervical and lumbar spine surgery, degenerative disc disease, trace listhesis on lumbar spine x-ray November.  Has treated with gabapentin, prednisone previously and has followed up with neurosurgeon Dr. Yetta Barre.  Appointment with Dr. Yetta Barre in January, nothing worrisome on CT scan of cervical spine.  Trial of physical therapy with dry needling and continued on medical therapy with follow-up as needed.  Few episodes of PT had been completed when discussed at his last visit.  Did notice some increased back pain few weeks prior with radiating pain down his legs, both legs.  Denied bowel or bladder incontinence, saddle anesthesia at that visit.  Repeat prednisone was discussed but declined.  He was taking gabapentin 2 in the morning, 2 at night at that visit.  Had not changed dosing with increased pain.  Recommended higher dose of gabapentin with additional midday dose to begin with, then additional dose in the evening, followed by additional dose in the morning if needed at 5-day intervals until relief of symptoms.  Currently back pain has improved. Neck has improved as well. Quit physical therapy. Felt like caused BP go up. He has been doing home exercises recommended.  Now on midday dose of gabapentin - 2 pills tid now. No new side effects with higher dose.   Excessive cerumen in ear canal Noted at his last visit excessive cerumen in  left ear canal, possible because of the noise that he heard at that time.  We did discuss the option of Debrox over-the-counter as it was not obstructed completely at that time.  He does feel like he may have a Q-tip in canal. Has bene using qtips to clean ears. Cotton stuck on right. Ears feel blocked.     History Patient Active Problem List   Diagnosis Date Noted   Fatigue 08/17/2022   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   Hx of colonic polyps 01/10/2022   Atrial fibrillation, transient (HCC) 11/02/2021   Hypotension    Chest pain 10/28/2021   Prostate cancer (HCC) 04/02/2021   Chronic HFrEF (heart failure with reduced ejection fraction) (HCC) 05/20/2020   Claudication (HCC) 09/01/2019   S/P cervical spinal fusion 04/20/2018   Paresthesia 01/10/2018   Neck pain 01/10/2018   LBBB (left bundle branch block) 09/07/2017   Other meniscus derangements, posterior horn of medial meniscus, left knee, insufficency fracture medial tibial plateau 04/07/2017   Closed nondisplaced fracture of lateral condyle of left tibia with routine healing 04/07/2017   Status post arthroscopy of left knee 04/07/2017   Insomnia 09/14/2015   Hyperlipidemia with target LDL less than 100 09/11/2014   Rotator cuff tendinitis 04/28/2014   Obesity (BMI 30-39.9) 12/29/2013   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   Exertional shortness of breath 06/26/2012   Polyp of colon, adenomatous 04/27/2012  Elevated PSA 10/20/2011   Hypertriglyceridemia 07/08/2011   Nonischemic cardiomyopathy (HCC) 09/04/2009   Past Medical History:  Diagnosis Date   Arthritis    BPH (benign prostatic hypertrophy)    CHF (congestive heart failure) (HCC)    Elevated PSA    Eye problem 03/06/2016   Dr. Jerilynn Birkenhead retinal detachment left eye, no sx done   History of gout    pt 05-20-2013 states stable    History of melanoma excision    2011-- LEFT UPPER BACK   Hyperlipemia    Hypertriglyceridemia     ICD (implantable cardioverter-defibrillator) battery depletion 10/27/2021   Left bundle branch block 08/2017   Concern for LBBB mediated cardiomyopathy   Melanoma (HCC) 2011   back   Nonischemic cardiomyopathy (HCC) 09/2020   08/2009: EF 40-45%, Global HK-> 09/2017 -> EF 45-50% ; 3/'22: EF<20%.  Elevated LVEDP.;  09/2020: EF 20-25%.  GR 1 DD   Numbness    hands   Personal history of arthritis    Personal history of other diseases of circulatory system    Pre-diabetes    Sickle cell anemia (HCC)    Past Surgical History:  Procedure Laterality Date   ANTERIOR CERVICAL DECOMP/DISCECTOMY FUSION N/A 04/20/2018   Procedure: Anterior Cervical Decompression Fusion - Cervical Three-Cervical Four - Cervical Four-Cervical Five - Cervical Five-Cervical Six;  Surgeon: Tia Alert, MD;  Location: [facility] OR;  Service: Neurosurgery;  Laterality: N/A;  Anterior Cervical Decompression Fusion - Cervical Three-Cervical Four - Cervical Four-Cervical Five - Cervical Five-Cervical Six   APPENDECTOMY  AS CHILD   BACK SURGERY     BIV ICD INSERTION CRT-D N/A 10/27/2021   Procedure: BIV ICD INSERTION CRT-D;  Surgeon: Marinus Maw, MD;  Location: [facility] INVASIVE CV LAB;  Service: Cardiovascular;  Laterality: N/A;   CARDIOVASCULAR STRESS TEST  10/05/1998   MILD GLOBAL HYPOKINESIS AND ISCHEMIAIN ANTEROSEPTAL  AT [facility]/ EF 42%   CARPAL TUNNEL RELEASE Right    CENTRAL LINE INSERTION  10/28/2021   Procedure: CENTRAL LINE INSERTION;  Surgeon: Regan Lemming, MD;  Location: [facility] INVASIVE CV LAB;  Service: Cardiovascular;;   CENTRAL LINE INSERTION  10/28/2021   Procedure: CENTRAL LINE INSERTION;  Surgeon: Tonny Bollman, MD;  Location: [facility] INVASIVE CV LAB;  Service: Cardiovascular;;   COLONOSCOPY WITH PROPOFOL N/A 03/14/2022   Procedure: COLONOSCOPY WITH PROPOFOL;  Surgeon: Napoleon Form, MD;  Location: [facility] ENDOSCOPY;  Service: Gastroenterology;  Laterality: N/A;   EXCISION MELANOMA LEFT UPPER BACK  10/14/2009   eye lid  surgery Bilateral 2018   GOLD SEED IMPLANT N/A 07/23/2021   Procedure: GOLD SEED IMPLANT;  Surgeon: Crist Fat, MD;  Location: [facility] ORS;  Service: Urology;  Laterality: N/A;   KNEE ARTHROSCOPY WITH SUBCHONDROPLASTY Left 04/07/2017   Procedure: LEFT KNEE ARTHROSCOPY WITH PARTIAL MEDIAL MENISCECTOMY AND MEDIAL TIBIAL SUBCHONDROPLASTY;  Surgeon: Kathryne Hitch, MD;  Location: [facility] ORS;  Service: Orthopedics;  Laterality: Left;   knee injections Bilateral    LEAD REVISION/REPAIR N/A 10/28/2021   Procedure: LEAD REVISION/REPAIR;  Surgeon: Regan Lemming, MD;  Location: [facility] INVASIVE CV LAB;  Service: Cardiovascular;  Laterality: N/A;   LEFT HEART CATH AND CORONARY ANGIOGRAPHY  04/2002   minimal irregularities in the LAD/  EF 50%  -   DR AL LITTLE   LUMBAR DISC SURGERY  09/12/2000   left  L5 -- S1   PERICARDIOCENTESIS N/A 10/28/2021   Procedure: PERICARDIOCENTESIS;  Surgeon: Regan Lemming, MD;  Location: [facility] INVASIVE CV LAB;  Service: Cardiovascular;  Laterality: N/A;   PERICARDIOCENTESIS N/A 10/28/2021   Procedure: PERICARDIOCENTESIS;  Surgeon: Tonny Bollman, MD;  Location: [facility] INVASIVE CV LAB;  Service: Cardiovascular;  Laterality: N/A;   PROSTATE BIOPSY N/A 05/22/2013   Procedure: BIOPSY TRANSRECTAL ULTRASONIC PROSTATE (TUBP);  Surgeon: Valetta Fuller, MD;  Location: [facility];  Service: Urology;  Laterality: N/A;   RIGHT/LEFT HEART CATH AND CORONARY ANGIOGRAPHY N/A 06/25/2020   Procedure: RIGHT/LEFT HEART CATH AND CORONARY ANGIOGRAPHY;  Surgeon: Lyn Records, MD;  Location: [facility] INVASIVE CV LAB;:; Normal coronary arteries; LVEDP 23 mmHg; PCWP 19 mmHg.   ROTATOR CUFF REPAIR Right 2016   SPACE OAR INSTILLATION N/A 07/23/2021   Procedure: SPACE OAR INSTILLATION;  Surgeon: Crist Fat, MD;  Location: [facility] ORS;  Service: Urology;  Laterality: N/A;   THROAT SURGERY  04/20/2018   TRANSRECTAL ULTRASOUND PROSTATE BX  05-23-2005  &  04-19-2001   TRANSTHORACIC  ECHOCARDIOGRAM  09/2017   EF 45-50 % (previously reported as 40 to 45%).  Incoordinate septal motion with mild LVH.  GR 1 DD.  Aortic sclerosis but no stenosis.   TRANSTHORACIC ECHOCARDIOGRAM  10/06/2020   Severely reduced EF of 20 to 25%.  No LV thrombus.  Severe septal-lateral wall systolic dyssynchrony due to LBBB.  Global HK.  Moderately dilated LV.  GR 1 DD with mild LA dilation..  Unable to assess PAP with normal RV size and function.  Normal RAP.  Mild AOV sclerosis but no stenosis.  Mild to moderate MR.   TRANSTHORACIC ECHOCARDIOGRAM  06/16/2020   Severely reduced EF <20%.  Moderate severely dilated LV.  GR 2 DD.  Elevated LVEDP.  Mild LA dilation.  Mildly reduced RV function.  Mild MR.  Mild aortic valve calcification   Allergies  Allergen Reactions   Nitrostat [Nitroglycerin] Anaphylaxis    "Cardiologist suggested he shouldn't take this med because it could kill him with his heart condition. Lowers BP"   Ace Inhibitors Swelling   Mevacor [Lovastatin] Nausea Only   Solarcaine [Benzocaine] Dermatitis   Prior to Admission medications   Medication Sig Start Date End Date Taking? Authorizing Provider  acetaminophen (TYLENOL) 500 MG tablet Take 1,000 mg by mouth every 6 (six) hours as needed for moderate pain.   Yes [provider]  aspirin EC 81 MG tablet Take 1 tablet (81 mg total) by mouth daily. 04/26/18  Yes Costella, Darci Current, PA-C  colchicine 0.6 MG tablet TAKE 1 TABLET TWICE DAILY 12/27/22  Yes Shade Flood, MD  Cyanocobalamin (VITAMIN B-12 PO) Take 5,000 mcg by mouth daily.   Yes [provider]  finasteride (PROSCAR) 5 MG tablet Take 5 mg by mouth daily.   Yes [provider]  FLUZONE HIGH-DOSE 0.5 ML injection Inject 0.5 mLs into the muscle once. 12/27/22  Yes [provider]  furosemide (LASIX) 40 MG tablet TAKE 1 TABLET TWICE DAILY Patient taking differently: Take 40 mg by mouth 2 (two) times a week. 01/10/22  Yes [name], MD   gabapentin (NEURONTIN) 300 MG capsule Take 1-2 capsules (300-600 mg total) by mouth 3 (three) times daily as needed (pain). 02/27/23  Yes Shade Flood, MD  MAGNESIUM PO Take 400 mg by mouth in the morning.   Yes [provider]  Multiple Vitamin (MULTIVITAMIN WITH MINERALS) TABS tablet Take 1 tablet by mouth daily. One-A-Day Men Health Multivitamin   Yes [provider]  naproxen sodium (ALEVE) 220 MG tablet Take 440 mg by mouth daily  as needed (pain).   Yes [provider]  Omega-3 Fatty Acids (FISH OIL PO) Take 1,000 mg by mouth in the morning.   Yes [provider]  tamsulosin (FLOMAX) 0.4 MG CAPS capsule TAKE 1 CAPSULE EVERY DAY 08/04/22  Yes Shade Flood, MD  Turmeric (QC TUMERIC COMPLEX PO) Take 1 tablet by mouth in the morning.   Yes [provider]  hydrALAZINE (APRESOLINE) 50 MG tablet Take 1 tablet (50 mg total) by mouth in the morning and at bedtime. 09/26/22 02/06/23  [name], MD  rosuvastatin (CRESTOR) 5 MG tablet Take 1 tablet (5 mg total) by mouth every Monday, Wednesday, and Friday. 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  Tobacco Use   Smoking status: Former    Current packs/day: 0.00    Average packs/day: 2.0 packs/day for 20.0 years (40.0 ttl pk-yrs)    Types: Cigarettes    Start date: 03/28/1960    Quit date: 03/28/1980    Years since quitting: 43.2   Smokeless tobacco: Never  Vaping Use   Vaping status: Never Used  Substance and Sexual Activity   Alcohol use: No    Alcohol/week: 0.0 standard drinks of alcohol   Drug use: No   Sexual activity: Not Currently  Other Topics Concern   Not on file  Social History  Narrative   He walks on a relatively regular basis about 15 minutes at a time mostly to the discomfort. He previously had walked up a 30-40 minutes a time without problems. Education: [employer].   Lives alone.   Right-handed.   One cup coffee daily.   Social Drivers of Corporate investment banker Strain: Low Risk  (12/01/2022)   Overall Financial Resource Strain (CARDIA)    Difficulty of Paying Living Expenses: Not hard at all  Food Insecurity: No Food Insecurity (12/01/2022)   Hunger Vital Sign    Worried About Running Out of Food in the Last Year: Never true    Ran Out of Food in the Last Year: Never true  Transportation Needs: No Transportation Needs (12/01/2022)   PRAPARE - Administrator, Civil Service (Medical): No    Lack of Transportation (Non-Medical): No  Physical Activity: Sufficiently Active (12/01/2022)   Exercise Vital Sign    Days of Exercise per Week: 5 days    Minutes of Exercise per Session: 30 min  Stress: No Stress Concern Present (12/01/2022)   [employer] of Occupational Health - Occupational Stress Questionnaire    Feeling of Stress : Not at all  Social Connections: Moderately Isolated (12/01/2022)   Social Connection and Isolation Panel [NHANES]    Frequency of Communication with Friends and Family: More than three times a week    Frequency of Social Gatherings with Friends and Family: Three times a week    Attends Religious Services: More than 4 times per year    Active Member of Clubs or Organizations: No    Attends Banker Meetings: Never    Marital Status: Widowed  Intimate Partner Violence: Not At Risk (12/01/2022)   Humiliation, Afraid, Rape, and Kick questionnaire    Fear of  Current or Ex-Partner: No    Emotionally Abused: No    Physically Abused: No    Sexually Abused: No    Review of Systems   Objective:   Vitals:   06/07/23 0908  BP: 124/70  Pulse: 81  Temp: 98.4 F (36.9 C)  TempSrc: Temporal  SpO2: 96%   Weight: 200 lb (90.7 kg)  Height: 5\' 8"  (1.727 m)     Physical Exam Vitals reviewed.  Constitutional:      General: He is not in acute distress.    Appearance: Normal appearance. He is well-developed.  HENT:     Head: Normocephalic and atraumatic.     Ears:     Comments: Right ear, pinna nontender, no pain with traction.  Canal obstructed with white cotton appearing object to mid canal.  Unable to visualize TM.  No canal edema or erythema, or discharge distal to foreign body.  Left ear, pinna nontender.  No pain with traction.  Canal obstructed with dark yellow/brown cerumen.  Unable to visualize TM Cardiovascular:     Rate and Rhythm: Normal rate.  Pulmonary:     Effort: Pulmonary effort is normal.  Musculoskeletal:     Comments: Lumbar spine, no midline bony tenderness.  Straight leg raise testing he reports discomfort around the knees and slightly up thighs, upper calves.  Does not report pain into the buttock or back with this maneuver.  Ambulating without assistive device.  Neurological:     Mental Status: He is alert and oriented to person, place, and time.  Psychiatric:        Mood and Affect: Mood normal.     Risks, benefits, alternatives discussed for lavage of left ear for cerumen impaction.  We discussed potential risk to canal, unlikely but possible perforation of TM.  Option of meeting with ENT for procedure but he chose to have lavage performed here with verbal consent obtained after discussion above. Unfortunately with multiple attempts, unable to remove hard cerumen of left canal, no complications.  Plan for overnight Debrox attempt and repeat procedure in the next 48 hours.  Deferred removal of foreign body of right ear, as we currently do not have alligator forceps.  Will have patient come back for nurse visit or procedure visit hopefully next few days once I am able to obtain that piece of equipment, and then we can decide if lavage needed for right canal as well  after removal of foreign body.  Option of ENT evaluation or urgent care evaluation was given which he declined.   Assessment & Plan:  Bastion Bolger is a 78 y.o. male . No diagnosis found.   No orders of the defined types were placed in this encounter.  There are no Patient Instructions on file for this visit.    Signed,   Meredith Staggers, MD [facility], [facility] 06/07/23 9:27 AM

## 2023-06-07 NOTE — Patient Instructions (Addendum)
 Glad to hear that the back pain has somewhat improved.  Continue home exercises prescribed by physical therapy.  Okay to continue gabapentin 2 pills 3 times per day.  If you are noticing more discomfort, you can increase by 1 pill at 1 dose (such as 3 pills at bedtime, remaining at 2 pills in the morning and afternoon.)  If that dose is tolerated and you still have pain after a week, can increase another dose.  Do not increase more than 1 dose per week.  If you continue to have back pain, I would recommend discussion of options with your back surgeon, including possibly meeting with pain management provider if you did not tolerate physical therapy, and if there are no other recommended procedures or interventions from your back specialist.  Please let us know if we can help further and I am happy to place a pain management referral if needed.  Hang in there.  We will call you once I can obtain alligator forceps to remove the foreign body in the right ear canal. If any pain in that ear, be seen by urgent care as we discussed. Left ear wax was unfortunately difficult to review today.  Try using over the counter Debrox tonight, and we will likely repeat procedure tomorrow or Friday at the latest when removing the cotton foreign body from the right ear.  Thank you for coming in today.  We will call you for the time to return for repeat procedure.

## 2023-06-09 ENCOUNTER — Ambulatory Visit: Admitting: Student in an Organized Health Care Education/Training Program

## 2023-06-09 ENCOUNTER — Encounter: Payer: Self-pay | Admitting: Student in an Organized Health Care Education/Training Program

## 2023-06-09 VITALS — BP 118/76 | HR 84 | Temp 98.1°F | Wt 198.0 lb

## 2023-06-09 DIAGNOSIS — T161XXA Foreign body in right ear, initial encounter: Secondary | ICD-10-CM | POA: Insufficient documentation

## 2023-06-09 DIAGNOSIS — H6122 Impacted cerumen, left ear: Secondary | ICD-10-CM | POA: Diagnosis not present

## 2023-06-09 NOTE — Assessment & Plan Note (Signed)
 Procedure Note:  Risks and benefits discussed.  Consent obtained.  Successful irrigation of the left ear.  I used a curette to remove large dry pieces of wax from the ear canal.  After irrigation the ear canal was clear, tympanic membrane was intact and appeared healthy.  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  I offered him referral for hearing aid evaluation, but he declined for now.  We did talk about not using Q-tips or putting anything into his ears.

## 2023-06-09 NOTE — Assessment & Plan Note (Signed)
 Procedure Note:  Risks and benefits were discussed.  Consent obtained.  I used a alligator forceps to firmly grasp a cotton tip and removed it from his right ear canal.  He tolerated this well.  Tympanic membrane and ear canal appeared healthy after foreign body removal.

## 2023-06-09 NOTE — Progress Notes (Signed)
   Acute Office Visit  Subjective:     Patient ID: [name] [name], male    DOB: 1946/03/05, 78 y.o.   MRN: [id number]  Chief Complaint  Patient presents with   Foreign Body in Ear    excessive cerumen in left ear canal,  He does feel like he may have a Q-tip in canal. Has bene using qtips to clean ears. Cotton stuck on right. Ears feel blocked. Noted by Dr.Greene on 06/07/2023, was not able to remove when in office.     HPI Patient is in today for removal of a foreign body cotton tip in his right ear.  He also feels that his left ear is blocked.  He has chronic hearing loss, says he just turns up the television, has not been evaluated for hearing aids before.  No systemic symptoms, otherwise feeling well.  Had a dermatologic procedure yesterday on his left upper eyebrow, seems to be healing well.      Objective:    BP 118/76   Pulse 84   Temp 98.1 F (36.7 C) (Temporal)   Wt 198 lb (89.8 kg)   SpO2 96%   BMI 30.11 kg/m    Physical Exam  Right Ear: Cotton swab tip was seen in the ear canal on the right, this was removed using alligator forceps Left Ear: Dry impacted cerumen on the left.  This was softened overnight with Debrox drops.  We did irrigation.  I removed a larger firmer pieces of wax using a curette.      Assessment & Plan:   Problem List Items Addressed This Visit       Unprioritized   Foreign body in right ear - Primary   Procedure Note:  Risks and benefits were discussed.  Consent obtained.  I used a alligator forceps to firmly grasp a cotton tip and removed it from his right ear canal.  He tolerated this well.  Tympanic membrane and ear canal appeared healthy after foreign body removal.      Impacted cerumen of left ear   Procedure Note:  Risks and benefits discussed.  Consent obtained.  Successful irrigation of the left ear.  I used a curette to remove large dry pieces of wax from the ear canal.  After irrigation the ear canal was clear, tympanic  membrane was intact and appeared healthy.  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  I offered him referral for hearing aid evaluation, but he declined for now.  We did talk about not using Q-tips or putting anything into his ears.       No orders of the defined types were placed in this encounter.   No follow-ups on file.  Tyson Alias, MD

## 2023-06-22 DIAGNOSIS — H02831 Dermatochalasis of right upper eyelid: Secondary | ICD-10-CM | POA: Diagnosis not present

## 2023-06-22 DIAGNOSIS — H2513 Age-related nuclear cataract, bilateral: Secondary | ICD-10-CM | POA: Diagnosis not present

## 2023-06-22 DIAGNOSIS — H47011 Ischemic optic neuropathy, right eye: Secondary | ICD-10-CM | POA: Diagnosis not present

## 2023-06-22 DIAGNOSIS — D3132 Benign neoplasm of left choroid: Secondary | ICD-10-CM | POA: Diagnosis not present

## 2023-06-22 DIAGNOSIS — H43812 Vitreous degeneration, left eye: Secondary | ICD-10-CM | POA: Diagnosis not present

## 2023-06-22 DIAGNOSIS — H02834 Dermatochalasis of left upper eyelid: Secondary | ICD-10-CM | POA: Diagnosis not present

## 2023-06-29 DIAGNOSIS — M4316 Spondylolisthesis, lumbar region: Secondary | ICD-10-CM | POA: Diagnosis not present

## 2023-06-29 DIAGNOSIS — Z6829 Body mass index (BMI) 29.0-29.9, adult: Secondary | ICD-10-CM | POA: Diagnosis not present

## 2023-06-30 ENCOUNTER — Other Ambulatory Visit: Payer: Self-pay | Admitting: Neurological Surgery

## 2023-06-30 ENCOUNTER — Other Ambulatory Visit: Payer: Self-pay | Admitting: Neurosurgery

## 2023-06-30 DIAGNOSIS — M4302 Spondylolysis, cervical region: Secondary | ICD-10-CM

## 2023-06-30 DIAGNOSIS — M4316 Spondylolisthesis, lumbar region: Secondary | ICD-10-CM

## 2023-07-04 ENCOUNTER — Ambulatory Visit: Payer: Medicare HMO | Attending: Cardiology | Admitting: Cardiology

## 2023-07-04 VITALS — BP 116/80 | HR 87 | Ht 68.0 in | Wt 194.8 lb

## 2023-07-04 DIAGNOSIS — I4891 Unspecified atrial fibrillation: Secondary | ICD-10-CM

## 2023-07-04 DIAGNOSIS — Z95810 Presence of automatic (implantable) cardiac defibrillator: Secondary | ICD-10-CM

## 2023-07-04 DIAGNOSIS — I428 Other cardiomyopathies: Secondary | ICD-10-CM | POA: Diagnosis not present

## 2023-07-04 DIAGNOSIS — E785 Hyperlipidemia, unspecified: Secondary | ICD-10-CM | POA: Diagnosis not present

## 2023-07-04 DIAGNOSIS — R0789 Other chest pain: Secondary | ICD-10-CM

## 2023-07-04 DIAGNOSIS — I5022 Chronic systolic (congestive) heart failure: Secondary | ICD-10-CM | POA: Diagnosis not present

## 2023-07-04 DIAGNOSIS — I952 Hypotension due to drugs: Secondary | ICD-10-CM
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
## 2023-07-04 NOTE — Patient Instructions (Signed)
 Medication Instructions:  No changes   *If you need a refill on your cardiac medications before your next appointment, please call your pharmacy*   Lab Work: Not needed   Testing/Procedures: Not needed  Follow-Up: At [facility], you and your health needs are our priority.  As part of our continuing mission to provide you with exceptional heart care, we have created designated Provider Care Teams.  These Care Teams include your primary Cardiologist (physician) and Advanced Practice Providers (APPs -  Physician Assistants and Nurse Practitioners) who all work together to provide you with the care you need, when you need it.     Your next appointment:   12 month(s)  The format for your next appointment:   In Person  Provider:   Bryan Lemma, MD    Other Instructions

## 2023-07-07 NOTE — Discharge Instructions (Signed)

## 2023-07-09 ENCOUNTER — Encounter: Payer: Self-pay | Admitting: Cardiology

## 2023-07-09 NOTE — Assessment & Plan Note (Addendum)
 Heart Failure with Reduced Ejection Fraction Ejection fraction stable at 25%.  .No decompensation signs.  Exertional dyspnea present but manageable.  Seems to be doing okay with ICD . Remains reticent and unwilling to take many medications, in light of his stable symptoms, will hold off on adjusting. - Continue hydralazine 50 mg twice daily for afterload reduction. - No beta-blocker because of issues with fatigue in the past.  Trying to hold off on actually having him paced - Minimal use of furosemide keep, currently taking it 2 or 3 days a week.  Additional doses as needed for edema or dyspnea. - Because of issues with hypotension, holding off on spironolactone.  He refused SGL 2 inhibitor - Encourage physical activity as tolerated.

## 2023-07-09 NOTE — Assessment & Plan Note (Signed)
 Low EF despite BiV ICD placement.  Thankfully, no arrhythmias and no exacerbations. No longer on amiodarone or beta-blocker.  He has been reluctant to adjust any other medications. Not able to use ACE inhibitor due to history of angioedema. May fatigue and bradycardia we actually wean him off beta-blocker. Hydralazine for afterload reduction.  Could consider Imdur if necessary Did not want to take SGLT2 inhibitor, and with minimal diuretic requirement, holding off on spironolactone because of hypotension.

## 2023-07-09 NOTE — Assessment & Plan Note (Signed)
 No recurrent episodes.  No longer on amiodarone

## 2023-07-09 NOTE — Assessment & Plan Note (Signed)
 Slightly elevated cholesterol. On rosuvastatin. Discussed Nexlizet addition if needed. - Ensure cholesterol levels are checked by Dr. Marrie Sizer. - Consider adding Nexlizet if cholesterol levels remain elevated.

## 2023-07-09 NOTE — Assessment & Plan Note (Signed)
 History of hypotension therefore we are not using aggressive management.  Did not tolerate ARB.  No beta-blocker because of fatigue.
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
## 2023-07-09 NOTE — Assessment & Plan Note (Signed)
 Monitored by Dr. Carolynne Citron

## 2023-07-10 ENCOUNTER — Ambulatory Visit: Payer: Medicare HMO | Admitting: Family Medicine

## 2023-07-10 ENCOUNTER — Ambulatory Visit
Admission: RE | Admit: 2023-07-10 | Discharge: 2023-07-10 | Disposition: A | Discharge status: Home / Self Care | Source: Ambulatory Visit | Attending: Neurological Surgery | Admitting: Neurological Surgery

## 2023-07-10 DIAGNOSIS — M4316 Spondylolisthesis, lumbar region: Secondary | ICD-10-CM | POA: Diagnosis not present

## 2023-07-10 DIAGNOSIS — M47816 Spondylosis without myelopathy or radiculopathy, lumbar region: Secondary | ICD-10-CM | POA: Diagnosis not present

## 2023-07-10 DIAGNOSIS — M4807 Spinal stenosis, lumbosacral region: Secondary | ICD-10-CM | POA: Diagnosis not present

## 2023-07-10 DIAGNOSIS — M48061 Spinal stenosis, lumbar region without neurogenic claudication: Secondary | ICD-10-CM | POA: Diagnosis not present

## 2023-07-10 DIAGNOSIS — M51362 Other intervertebral disc degeneration, lumbar region with discogenic back pain and lower extremity pain: Secondary | ICD-10-CM | POA: Diagnosis not present

## 2023-07-10 DIAGNOSIS — M47817 Spondylosis without myelopathy or radiculopathy, lumbosacral region: Secondary | ICD-10-CM | POA: Diagnosis not present

## 2023-07-10 MED ORDER — DIAZEPAM 5 MG PO TABS
5.0000 mg | ORAL_TABLET | Freq: Once | ORAL | Status: AC
  Administered 2023-07-10: 5 mg via ORAL

## 2023-07-10 MED ORDER — ONDANSETRON HCL 4 MG/2ML IJ SOLN: 4.0000 mg | Freq: Once | INTRAMUSCULAR | Status: DC | PRN

## 2023-07-10 MED ORDER — IOPAMIDOL (ISOVUE-M 200) INJECTION 41%
20.0000 mL | Freq: Once | INTRAMUSCULAR | Status: AC
  Administered 2023-07-10: 20 mL via INTRATHECAL

## 2023-07-10 MED ORDER — MEPERIDINE HCL 50 MG/ML IJ SOLN: 50.0000 mg | Freq: Once | INTRAMUSCULAR | Status: DC | PRN

## 2023-07-12 ENCOUNTER — Encounter: Payer: Self-pay | Admitting: Family Medicine

## 2023-07-12 ENCOUNTER — Ambulatory Visit (INDEPENDENT_AMBULATORY_CARE_PROVIDER_SITE_OTHER): Admitting: Family Medicine

## 2023-07-12 VITALS — BP 122/66 | HR 81 | Temp 98.0°F | Ht 68.0 in | Wt 200.2 lb

## 2023-07-12 DIAGNOSIS — M542 Cervicalgia: Secondary | ICD-10-CM

## 2023-07-12 DIAGNOSIS — H9193 Unspecified hearing loss, bilateral: Secondary | ICD-10-CM | POA: Diagnosis not present

## 2023-07-12 DIAGNOSIS — M545 Low back pain, unspecified: Secondary | ICD-10-CM

## 2023-07-12 DIAGNOSIS — R7303 Prediabetes: Secondary | ICD-10-CM

## 2023-07-12 DIAGNOSIS — I5022 Chronic systolic (congestive) heart failure: Secondary | ICD-10-CM

## 2023-07-12 DIAGNOSIS — G8929 Other chronic pain: Secondary | ICD-10-CM

## 2023-07-12 DIAGNOSIS — M79604 Pain in right leg: Secondary | ICD-10-CM

## 2023-07-12 DIAGNOSIS — M4316 Spondylolisthesis, lumbar region: Secondary | ICD-10-CM

## 2023-07-12 DIAGNOSIS — E781 Pure hyperglyceridemia: Secondary | ICD-10-CM | POA: Diagnosis not present

## 2023-07-12 DIAGNOSIS — M79605 Pain in left leg: Secondary | ICD-10-CM

## 2023-07-12 LAB — COMPREHENSIVE METABOLIC PANEL WITH GFR
ALT: 17 U/L (ref 0–53)
AST: 19 U/L (ref 0–37)
Albumin: 4.7 g/dL (ref 3.5–5.2)
Alkaline Phosphatase: 58 U/L (ref 39–117)
BUN: 19 mg/dL (ref 6–23)
CO2: 28 meq/L (ref 19–32)
Calcium: 9.7 mg/dL (ref 8.4–10.5)
Chloride: 103 meq/L (ref 96–112)
Creatinine, Ser: 1.06 mg/dL (ref 0.40–1.50)
GFR: 66.89 mL/min (ref >60.00)
Glucose, Bld: 97 mg/dL (ref 70–99)
Potassium: 4.4 meq/L (ref 3.5–5.1)
Sodium: 138 meq/L (ref 135–145)
Total Bilirubin: 0.4 mg/dL (ref 0.2–1.2)
Total Protein: 7.2 g/dL (ref 6.0–8.3)

## 2023-07-12 LAB — LIPID PANEL
Cholesterol: 163 mg/dL (ref 0–200)
HDL: 42.6 mg/dL (ref >39.00)
LDL Cholesterol: 82 mg/dL (ref 0–99)
NonHDL: 120.33
Total CHOL/HDL Ratio: 4
Triglycerides: 193 mg/dL — ABNORMAL HIGH (ref 0.0–149.0)
VLDL: 38.6 mg/dL (ref 0.0–40.0)

## 2023-07-12 LAB — HEMOGLOBIN A1C: Hgb A1c MFr Bld: 6.5 % (ref 4.6–6.5)
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
## 2023-07-13 ENCOUNTER — Encounter: Payer: Self-pay | Admitting: Family Medicine

## 2023-07-13 ENCOUNTER — Telehealth: Payer: Self-pay

## 2023-07-13 DIAGNOSIS — M4316 Spondylolisthesis, lumbar region: Secondary | ICD-10-CM | POA: Diagnosis not present

## 2023-07-13 DIAGNOSIS — Z01810 Encounter for preprocedural cardiovascular examination: Secondary | ICD-10-CM

## 2023-07-13 NOTE — Addendum Note (Signed)
 Addended by: Shonica Weier M on: 07/13/2023 06:06 PM   Modules accepted: Orders

## 2023-07-13 NOTE — Telephone Encounter (Signed)
 I will place echo order and send a message to the echo scheduler Elinda Guard. To reach out to the pt with an appt.

## 2023-07-13 NOTE — Telephone Encounter (Signed)
   Pre-operative Risk Assessment    Patient Name: [name] [name]  DOB: 20-Dec-1945 MRN: [id number]   Date of last office visit: 07/04/23 Randene Bustard, MD Date of next office visit: NONE   Request for Surgical Clearance    Procedure:   L4-5 LUMBAR FUSION  Date of Surgery:  Clearance TBD                                Surgeon:  Waymond [name] Surgeon's Group or Practice Name:  [facility] Phone number:  [phone] Fax number:  [phone]   Type of Clearance Requested:   - Medical  - Pharmacy:  Hold Aspirin     Type of Anesthesia:  General    Additional requests/questions:    SignedCollin Deal   07/13/2023, 10:46 AM

## 2023-07-13 NOTE — Telephone Encounter (Signed)
 At this point, he is stable from a CHF standpoint he is not having any active angina symptoms or progressive CHF.  Right now he cannot walk because of back pain but usually is able to do at least 4 METS. In the absence of any active angina or heart failure he is fine to proceed with surgery without any further evaluation.  Okay to hold aspirin.      [name] [name] perioperative risk of a major cardiac event is 0.9% according to the Revised Cardiac Risk Index (RCRI).  Therefore, he is at low risk for perioperative complications.   His functional capacity is  usually good, but reduced because of ongoing back pain.  at 7.25 METs according to the Duke Activity Status Index (DASI). Recommendations: The patient requires an echocardiogram before a disposition can be made regarding surgical risk.                Antiplatelet and/or Anticoagulation Recommendations: Aspirin can be held for 5 days prior to his surgery.  Please resume Aspirin post operatively when it is felt to be safe from a bleeding standpoint.  Not on DOAC    Randene Bustard, MD

## 2023-07-13 NOTE — Telephone Encounter (Signed)
 Callback team please order 2D echo for preoperative clearance per Dr. Addie Holstein.  Please see his response in prior note.  Thanks

## 2023-07-13 NOTE — Telephone Encounter (Signed)
 Good Morning Dr. Addie Holstein  We have received a surgical clearance request for [name] [name] for lumbar fusion procedure. They were seen recently in clinic on/8/25.  He has a PMH of HFrEF, AF, HLD, hypotension, NICM s/p ICD in situ. Can you please comment on surgical clearance and guidance on holding ASA 81 mg during procedure. Please forward you guidance and recommendations to P CV DIV PREOP   Thank you,  Charles Connor, NP

## 2023-07-18 ENCOUNTER — Ambulatory Visit (HOSPITAL_COMMUNITY): Attending: Cardiovascular Disease

## 2023-07-18 ENCOUNTER — Telehealth: Payer: Self-pay

## 2023-07-18 DIAGNOSIS — Z01810 Encounter for preprocedural cardiovascular examination: Secondary | ICD-10-CM | POA: Insufficient documentation

## 2023-07-18 LAB — ECHOCARDIOGRAM COMPLETE
AR max vel: 1.6 cm2
AV Area VTI: 1.54 cm2
AV Area mean vel: 1.57 cm2
AV Mean grad: 4.5 mmHg
AV Peak grad: 7.7 mmHg
Ao pk vel: 1.39 m/s
Calc EF: 22.7 %
S' Lateral: 5.88 cm
Single Plane A2C EF: 24.4 %
Single Plane A4C EF: 21.7 %

## 2023-07-18 NOTE — Telephone Encounter (Signed)
 Pt has been notified.

## 2023-07-18 NOTE — Telephone Encounter (Signed)
 Per pts request mailed a copy of lab results

## 2023-07-18 NOTE — Telephone Encounter (Signed)
-----   Message from Benjiman Bras sent at 07/18/2023  9:36 AM EDT ----- Results sent by MyChart, but appears patient has not yet reviewed those results.  Please call and make sure they have either seen note or discuss result note.  Thanks.

## 2023-07-20 ENCOUNTER — Encounter: Payer: Self-pay | Admitting: Cardiology

## 2023-07-20 NOTE — Telephone Encounter (Signed)
 I just had ordered an echo just to see if it was getting any better.  But it is stable from before.  Not much we can do about his low EF but he is pretty symptomatic patient to be very careful with his volume levels  Would not do any further evaluation.   DH

## 2023-07-21 NOTE — Telephone Encounter (Signed)
   Patient Name: [name] [name]  DOB: [birth year] MRN: [id number]  Primary Cardiologist: Randene Bustard, MD  Chart reviewed as part of pre-operative protocol coverage. Given past medical history and time since last visit, based on ACC/AHA guidelines, [name] [name] is at acceptable risk for the planned procedure without further cardiovascular testing.   He has chronically low ejection fraction, recent echocardiogram shows no significant change in his EF.  This was reviewed by Dr. Addie Holstein. Per Dr. Addie Holstein,  "[name] [name]'s perioperative risk of a major cardiac event is 0.9% according to the Revised Cardiac Risk Index (RCRI).  Therefore, he is at low risk for perioperative complications.   His functional capacity is  usually good, but reduced because of ongoing back pain.  at 7.25 METs according to the Duke Activity Status Index (DASI). "  He may hold aspirin  for 5 to 7 days prior to the surgery as needed and resume as soon as possible afterward at the surgeon's discretion based on the bleeding risk.  I will route this recommendation to the requesting party via Epic fax function and remove from pre-op pool.  Please call with questions.  [name] [name], [place] 07/21/2023, 11:37 AM

## 2023-07-25 ENCOUNTER — Other Ambulatory Visit: Payer: Self-pay | Admitting: Neurological Surgery

## 2023-07-27 ENCOUNTER — Ambulatory Visit (INDEPENDENT_AMBULATORY_CARE_PROVIDER_SITE_OTHER): Payer: Medicare HMO

## 2023-07-27 DIAGNOSIS — I428 Other cardiomyopathies: Secondary | ICD-10-CM | POA: Diagnosis not present

## 2023-07-27 LAB — CUP PACEART REMOTE DEVICE CHECK
Battery Remaining Longevity: 65 mo
Battery Remaining Percentage: 72 %
Battery Voltage: 2.99 V
Brady Statistic AP VP Percent: 1 %
Brady Statistic AP VS Percent: 1 %
Brady Statistic AS VP Percent: 94 %
Brady Statistic AS VS Percent: 5.3 %
Brady Statistic RA Percent Paced: 1 %
Date Time Interrogation Session: 20250501020016
HighPow Impedance: 73 Ohm
HighPow Impedance: 73 Ohm
Implantable Lead Connection Status: 753985
Implantable Lead Connection Status: 753985
Implantable Lead Connection Status: 753985
Implantable Lead Implant Date: 20230802
Implantable Lead Implant Date: 20230802
Implantable Lead Implant Date: 20230802
Implantable Lead Location: 753859
Implantable Lead Location: 753860
Implantable Lead Location: 753860
Implantable Lead Model: 7122
Implantable Pulse Generator Implant Date: 20230802
Lead Channel Impedance Value: 250 Ohm
Lead Channel Impedance Value: 400 Ohm
Lead Channel Impedance Value: 460 Ohm
Lead Channel Pacing Threshold Amplitude: 0.625 V
Lead Channel Pacing Threshold Amplitude: 1 V
Lead Channel Pacing Threshold Amplitude: 1.25 V
Lead Channel Pacing Threshold Pulse Width: 0.5 ms
Lead Channel Pacing Threshold Pulse Width: 0.5 ms
Lead Channel Pacing Threshold Pulse Width: 0.5 ms
Lead Channel Sensing Intrinsic Amplitude: 11.3 mV
Lead Channel Sensing Intrinsic Amplitude: 4.1 mV
Lead Channel Setting Pacing Amplitude: 0.25 V
Lead Channel Setting Pacing Amplitude: 2 V
Lead Channel Setting Pacing Amplitude: 2 V
Lead Channel Setting Pacing Pulse Width: 0.05 ms
Lead Channel Setting Pacing Pulse Width: 0.5 ms
Lead Channel Setting Sensing Sensitivity: 0.5 mV
Pulse Gen Serial Number: 5544603

## 2023-07-27 NOTE — Progress Notes (Signed)
 PERIOPERATIVE PRESCRIPTION FOR IMPLANTED CARDIAC DEVICE PROGRAMMING  Patient Information: Name:  [name] [name]  DOB:  03-Feb-1946  MRN:  [id number]    Planned Procedure:  Posterior Lumbar Interbody Fusion - Lumbar 4- lumbar 5  Surgeon:  Dr. Waymond [name]  Date of Procedure:  08/07/23  Cautery will be used.  Position during surgery:  Prone   Please send documentation back to:  Arlin Benes (Fax # [phone])  Device Information:  Clinic EP Physician:  Manya Sells, MD   Device Type:  Defibrillator Manufacturer and Phone #:  St. Jude/[employer]ott: [phone] Pacemaker Dependent?:  No. Date of Last Device Check:  07/27/2023  Normal Device Function?:  Yes.    Electrophysiologist's Recommendations:  Have magnet available. Provide continuous ECG monitoring when magnet is used or reprogramming is to be performed.  Procedure will likely interfere with device function.  Device should be programmed:  Tachy therapies disabled  Per Device Clinic Standing Orders, Glorianne Largo, RN  9:36 PM 07/27/2023

## 2023-07-27 NOTE — Progress Notes (Signed)
 Surgical Instructions   Your procedure is scheduled on Monday, May 12th, 2025. Report to [facility] Main Entrance "A" at 5:30 A.M., then check in with the Admitting office. Any questions or running late day of surgery: call [phone]  Questions prior to your surgery date: call [phone], Monday-Friday, 8am-4pm. If you experience any cold or flu symptoms such as cough, fever, chills, shortness of breath, etc. between now and your scheduled surgery, please notify us  at the above number.     Remember:  Do not eat or drink after midnight the night before your surgery     Take these medicines the morning of surgery with A SIP OF WATER : Gabapentin  (Neurontin ) Hydralazine  (Apresoline )   May take these medicines IF NEEDED: Acetaminophen  (Tylenol ) Colchicine      One week prior to surgery, STOP taking any Aspirin  (unless otherwise instructed by your surgeon) Aleve , Naproxen , Ibuprofen , Motrin , Advil , Goody's, BC's, all herbal medications, fish oil, and non-prescription vitamins.                     Do NOT Smoke (Tobacco/Vaping) for 24 hours prior to your procedure.  If you use a CPAP at night, you may bring your mask/headgear for your overnight stay.   You will be asked to remove any contacts, glasses, piercing's, hearing aid's, dentures/partials prior to surgery. Please bring cases for these items if needed.    Patients discharged the day of surgery will not be allowed to drive home, and someone needs to stay with them for 24 hours.  SURGICAL WAITING ROOM VISITATION Patients may have no more than 2 support people in the waiting area - these visitors may rotate.   Pre-op nurse will coordinate an appropriate time for 1 ADULT support person, who may not rotate, to accompany patient in pre-op.  Children under the age of 43 must have an adult with them who is not the patient and must remain in the main waiting area with an adult.  If the patient needs to stay at the hospital during  part of their recovery, the visitor guidelines for inpatient rooms apply.  Please refer to the [facility] website for the visitor guidelines for any additional information.   If you received a COVID test during your pre-op visit  it is requested that you wear a mask when out in public, stay away from anyone that may not be feeling well and notify your surgeon if you develop symptoms. If you have been in contact with anyone that has tested positive in the last 10 days please notify you surgeon.      Pre-operative 5 CHG Bathing Instructions   You can play a key role in reducing the risk of infection after surgery. Your skin needs to be as free of germs as possible. You can reduce the number of germs on your skin by washing with CHG (chlorhexidine  gluconate) soap before surgery. CHG is an antiseptic soap that kills germs and continues to kill germs even after washing.   DO NOT use if you have an allergy to chlorhexidine /CHG or antibacterial soaps. If your skin becomes reddened or irritated, stop using the CHG and notify one of our RNs at [phone].   Please shower with the CHG soap starting 4 days before surgery using the following schedule:     Please keep in mind the following:  DO NOT shave, including legs and underarms, starting the day of your first shower.   You may shave your face at any point  before/day of surgery.  Place clean sheets on your bed the day you start using CHG soap. Use a clean washcloth (not used since being washed) for each shower. DO NOT sleep with pets once you start using the CHG.   CHG Shower Instructions:  Wash your face and private area with normal soap. If you choose to wash your hair, wash first with your normal shampoo.  After you use shampoo/soap, rinse your hair and body thoroughly to remove shampoo/soap residue.  Turn the water  OFF and apply about 3 tablespoons (45 ml) of CHG soap to a CLEAN washcloth.  Apply CHG soap ONLY FROM YOUR NECK DOWN TO  YOUR TOES (washing for 3-5 minutes)  DO NOT use CHG soap on face, private areas, open wounds, or sores.  Pay special attention to the area where your surgery is being performed.  If you are having back surgery, having someone wash your back for you may be helpful. Wait 2 minutes after CHG soap is applied, then you may rinse off the CHG soap.  Pat dry with a clean towel  Put on clean clothes/pajamas   If you choose to wear lotion, please use ONLY the CHG-compatible lotions that are listed below.  Additional instructions for the day of surgery: DO NOT APPLY any lotions, deodorants, cologne, or perfumes.   Do not bring valuables to the hospital. [facility] is not responsible for any belongings/valuables. Do not wear nail polish, gel polish, artificial nails, or any other type of covering on natural nails (fingers and toes) Do not wear jewelry or makeup Put on clean/comfortable clothes.  Please brush your teeth.  Ask your nurse before applying any prescription medications to the skin.     CHG Compatible Lotions   Aveeno Moisturizing lotion  Cetaphil Moisturizing Cream  Cetaphil Moisturizing Lotion  Clairol Herbal Essence Moisturizing Lotion, Dry Skin  Clairol Herbal Essence Moisturizing Lotion, Extra Dry Skin  Clairol Herbal Essence Moisturizing Lotion, Normal Skin  Curel Age Defying Therapeutic Moisturizing Lotion with Alpha Hydroxy  Curel Extreme Care Body Lotion  Curel Soothing Hands Moisturizing Hand Lotion  Curel Therapeutic Moisturizing Cream, Fragrance-Free  Curel Therapeutic Moisturizing Lotion, Fragrance-Free  Curel Therapeutic Moisturizing Lotion, Original Formula  Eucerin Daily Replenishing Lotion  Eucerin Dry Skin Therapy Plus Alpha Hydroxy Crme  Eucerin Dry Skin Therapy Plus Alpha Hydroxy Lotion  Eucerin Original Crme  Eucerin Original Lotion  Eucerin Plus Crme Eucerin Plus Lotion  Eucerin TriLipid Replenishing Lotion  Keri Anti-Bacterial Hand Lotion  Keri Deep  Conditioning Original Lotion Dry Skin Formula Softly Scented  Keri Deep Conditioning Original Lotion, Fragrance Free Sensitive Skin Formula  Keri Lotion Fast Absorbing Fragrance Free Sensitive Skin Formula  Keri Lotion Fast Absorbing Softly Scented Dry Skin Formula  Keri Original Lotion  Keri Skin Renewal Lotion Keri Silky Smooth Lotion  Keri Silky Smooth Sensitive Skin Lotion  Nivea Body Creamy Conditioning Oil  Nivea Body Extra Enriched Lotion  Nivea Body Original Lotion  Nivea Body Sheer Moisturizing Lotion Nivea Crme  Nivea Skin Firming Lotion  NutraDerm 30 Skin Lotion  NutraDerm Skin Lotion  NutraDerm Therapeutic Skin Cream  NutraDerm Therapeutic Skin Lotion  ProShield Protective Hand Cream  Provon moisturizing lotion  Please read over the following fact sheets that you were given.

## 2023-07-28 ENCOUNTER — Encounter (HOSPITAL_COMMUNITY)
Admission: RE | Admit: 2023-07-28 | Discharge: 2023-07-28 | Disposition: A | Discharge status: Home / Self Care | Source: Ambulatory Visit | Attending: Neurological Surgery | Admitting: Neurological Surgery

## 2023-07-28 ENCOUNTER — Encounter (HOSPITAL_COMMUNITY): Payer: Self-pay

## 2023-07-28 ENCOUNTER — Other Ambulatory Visit: Payer: Self-pay

## 2023-07-28 VITALS — BP 122/74 | HR 90 | Temp 97.8°F | Resp 18 | Ht 68.5 in | Wt 196.9 lb

## 2023-07-28 DIAGNOSIS — D696 Thrombocytopenia, unspecified: Secondary | ICD-10-CM | POA: Insufficient documentation

## 2023-07-28 DIAGNOSIS — Z01818 Encounter for other preprocedural examination: Secondary | ICD-10-CM | POA: Diagnosis present

## 2023-07-28 DIAGNOSIS — I428 Other cardiomyopathies: Secondary | ICD-10-CM | POA: Diagnosis not present

## 2023-07-28 DIAGNOSIS — Z01812 Encounter for preprocedural laboratory examination: Secondary | ICD-10-CM | POA: Insufficient documentation

## 2023-07-28 DIAGNOSIS — I447 Left bundle-branch block, unspecified: Secondary | ICD-10-CM | POA: Diagnosis not present

## 2023-07-28 DIAGNOSIS — Z87891 Personal history of nicotine dependence: Secondary | ICD-10-CM | POA: Diagnosis not present

## 2023-07-28 DIAGNOSIS — Z95810 Presence of automatic (implantable) cardiac defibrillator: Secondary | ICD-10-CM | POA: Diagnosis not present

## 2023-07-28 DIAGNOSIS — I480 Paroxysmal atrial fibrillation: Secondary | ICD-10-CM | POA: Diagnosis not present

## 2023-07-28 DIAGNOSIS — C61 Malignant neoplasm of prostate: Secondary | ICD-10-CM | POA: Diagnosis not present

## 2023-07-28 LAB — SURGICAL PCR SCREEN
MRSA, PCR: NEGATIVE
Staphylococcus aureus: NEGATIVE

## 2023-07-28 LAB — CBC
HCT: 40.7 % (ref 39.0–52.0)
Hemoglobin: 13.4 g/dL (ref 13.0–17.0)
MCH: 29.6 pg (ref 26.0–34.0)
MCHC: 32.9 g/dL (ref 30.0–36.0)
MCV: 90 fL (ref 80.0–100.0)
Platelets: 147 10*3/uL — ABNORMAL LOW (ref 150–400)
RBC: 4.52 MIL/uL (ref 4.22–5.81)
RDW: 13.7 % (ref 11.5–15.5)
WBC: 8.2 10*3/uL (ref 4.0–10.5)
nRBC: 0 % (ref 0.0–0.2)

## 2023-07-28 LAB — TYPE AND SCREEN
ABO/RH(D): A POS
Antibody Screen: NEGATIVE

## 2023-07-28 LAB — PROTIME-INR
INR: 1 (ref 0.8–1.2)
Prothrombin Time: 13 s (ref 11.4–15.2)

## 2023-07-28 NOTE — Progress Notes (Addendum)
 PCP - Dr. Glendia Lands Cardiologist - Dr. Randene Bustard  - clearance 07/21/23 EP - Dr. Manya Sells   PPM/ICD - [employer] Jude ICD Device Orders - device orders received Rep Notified - notified rep on 07/28/23 at 1158  - Mara Seminole is the rep on call at the time of notification;  Peterson Brandt called at 1242 on 5/2 to confirm they will be here on DOS at 0700  Chest x-ray - 03/07/23 EKG - 02/28/23 Stress Test - denies ECHO - 07/18/23 Cardiac Cath - 2022  Sleep Study - denies  Pt does not check blood sugars at home  Last dose of GLP1 agonist-  n/a GLP1 instructions: n/a  Blood Thinner Instructions: n/a Aspirin  Instructions: patient instructed to stop Aspirin  5-7 days prior to surgery  ERAS Protcol - NPO   COVID TEST-  n/a   Anesthesia review: yes - ICD, cardiac clearance  Patient denies shortness of breath, fever, cough and chest pain at PAT appointment   All instructions explained to the patient, with a verbal understanding of the material. Patient agrees to go over the instructions while at home for a better understanding. Patient also instructed to self quarantine after being tested for COVID-19. The opportunity to ask questions was provided.

## 2023-07-30 ENCOUNTER — Encounter: Payer: Self-pay | Admitting: Internal Medicine

## 2023-07-31 NOTE — Anesthesia Preprocedure Evaluation (Addendum)
 Anesthesia Evaluation  Patient identified by MRN, date of birth, ID band Patient awake    Reviewed: Allergy & Precautions, H&P , NPO status , Patient's Chart, lab work & pertinent test results  Airway Mallampati: II   Neck ROM: full    Dental   Pulmonary former smoker   breath sounds clear to auscultation       Cardiovascular +CHF  + dysrhythmias Atrial Fibrillation + Cardiac Defibrillator  Rhythm:regular Rate:Normal  EF 20-25%   Neuro/Psych    GI/Hepatic   Endo/Other    Renal/GU      Musculoskeletal  (+) Arthritis ,    Abdominal   Peds  Hematology   Anesthesia Other Findings   Reproductive/Obstetrics                             Anesthesia Physical Anesthesia Plan  ASA: 4  Anesthesia Plan: General   Post-op Pain Management:    Induction: Intravenous  PONV Risk Score and Plan: 2 and Ondansetron , Dexamethasone  and Treatment may vary due to age or medical condition  Airway Management Planned: Oral ETT  Additional Equipment: Arterial line  Intra-op Plan:   Post-operative Plan: Extubation in OR  Informed Consent: I have reviewed the patients History and Physical, chart, labs and discussed the procedure including the risks, benefits and alternatives for the proposed anesthesia with the patient or authorized representative who has indicated his/her understanding and acceptance.     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  EF 20 to 25% by echo 06/2023.  Clearance per telephone encounter 07/21/2023 by [name] Heath, PA, "Chart reviewed as part of pre-operative protocol coverage. Given past medical history  and time since last visit, based on ACC/AHA guidelines, [name] [name] is at acceptable risk for the planned procedure without further cardiovascular testing.  He has chronically low ejection fraction, recent echocardiogram shows no significant change in his EF.  This was reviewed by Dr. Addie [name]. Per Dr. Addie [name], "[name] [name]'s perioperative risk of a major cardiac event is 0.9% according to the Revised Cardiac Risk Index (RCRI).  Therefore, he is at low risk for perioperative complications.   His functional capacity is  usually good, but reduced because of ongoing back pain.  at 7.25 METs according to the Duke Activity Status Index (DASI). " He may hold aspirin  for 5 to 7 days prior to the surgery as needed and resume as soon as possible afterward at the surgeon's discretion based on the bleeding risk."  Former smoker, 40 pack years, quit 1982.  Preop labs reviewed, mild thrombocytopenia platelets 147, otherwise WNL..  EKG 02/28/23: Atrial-sensed ventricular-paced rhythm.  Rate 81.  Perioperative prescription for implanted cardiac device programming per progress note 07/27/23: Device Information:  Clinic EP Physician:  [name] Sells, MD   Device Type:  Defibrillator Manufacturer and Phone #:  St. Jude/Abbott: [phone] Pacemaker Dependent?:  No. Date of Last Device Check:  07/27/2023      Normal Device Function?:  Yes.    Electrophysiologist's Recommendations:   Have magnet available.  Provide continuous ECG monitoring when magnet is used or reprogramming is to be performed.   Procedure will likely interfere with device function.  Device should be programmed:  Tachy therapies disabled  TTE 07/18/2023: 1. Left ventricular ejection fraction, by estimation, is 20 to 25%. The  left ventricle has severely decreased function. The left ventricle  demonstrates global hypokinesis. The left ventricular internal cavity size  was moderately to severely dilated.  There is mild eccentric  left ventricular hypertrophy. Left ventricular  diastolic parameters are consistent with Grade I diastolic dysfunction  (impaired relaxation).  2. Right ventricular systolic function is low normal. The right  ventricular size is normal. There is normal pulmonary artery systolic  pressure. The estimated right ventricular systolic pressure is 23.2 mmHg.  3. Left atrial size was moderately dilated.  4. The mitral valve is normal in structure. Mild mitral valve  regurgitation. No evidence of mitral stenosis.  5. The aortic valve is tricuspid. There is mild calcification of the  aortic valve. There is mild thickening of the aortic valve. Aortic valve  regurgitation is not visualized. Aortic valve sclerosis/calcification is  present, without any evidence of  aortic stenosis.  6. The inferior vena cava is normal in size with greater than 50%  respiratory variability, suggesting right atrial pressure of 3 mmHg.   Comparison(s): No significant change from prior study. Prior images  reviewed side by side.     )        Anesthesia Quick Evaluation

## 2023-07-31 NOTE — Progress Notes (Signed)
 Anesthesia Chart Review:  78 year old male follows with cardiology for history of NICM felt related to LBBB (cath 2022 with widely patent coronaries) s/p BiV ICD, paroxysmal A-fib (noted after ICD lead revision).  EF 20 to 25% by echo 06/2023.  Clearance per telephone encounter 07/21/2023 by Ervin Heath, PA, "Chart reviewed as part of pre-operative protocol coverage. Given past medical history and time since last visit, based on ACC/AHA guidelines, [name] [name] is at acceptable risk for the planned procedure without further cardiovascular testing.  He has chronically low ejection fraction, recent echocardiogram shows no significant change in his EF.  This was reviewed by Dr. Addie Holstein. Per Dr. Addie Holstein, "[name] [name]'s perioperative risk of a major cardiac event is 0.9% according to the Revised Cardiac Risk Index (RCRI).  Therefore, he is at low risk for perioperative complications.   His functional capacity is  usually good, but reduced because of ongoing back pain.  at 7.25 METs according to the Duke Activity Status Index (DASI). " He may hold aspirin  for 5 to 7 days prior to the surgery as needed and resume as soon as possible afterward at the surgeon's discretion based on the bleeding risk."  Former smoker, 40 pack years, quit 1982.  Preop labs reviewed, mild thrombocytopenia platelets 147, otherwise WNL..  EKG 02/28/23: Atrial-sensed ventricular-paced rhythm.  Rate 81.  Perioperative prescription for implanted cardiac device programming per progress note 07/27/23: Device Information:   Clinic EP Physician:  Manya Sells, MD    Device Type:  Defibrillator Manufacturer and Phone #:  St. Jude/Abbott: [phone] Pacemaker Dependent?:  No. Date of Last Device Check:  07/27/2023      Normal Device Function?:  Yes.     Electrophysiologist's Recommendations:   Have magnet available. Provide continuous ECG monitoring when magnet is used or reprogramming is to be performed.  Procedure will likely  interfere with device function.  Device should be programmed:  Tachy therapies disabled  TTE 07/18/2023:  1. Left ventricular ejection fraction, by estimation, is 20 to 25%. The  left ventricle has severely decreased function. The left ventricle  demonstrates global hypokinesis. The left ventricular internal cavity size  was moderately to severely dilated.  There is mild eccentric left ventricular hypertrophy. Left ventricular  diastolic parameters are consistent with Grade I diastolic dysfunction  (impaired relaxation).   2. Right ventricular systolic function is low normal. The right  ventricular size is normal. There is normal pulmonary artery systolic  pressure. The estimated right ventricular systolic pressure is 23.2 mmHg.   3. Left atrial size was moderately dilated.   4. The mitral valve is normal in structure. Mild mitral valve  regurgitation. No evidence of mitral stenosis.   5. The aortic valve is tricuspid. There is mild calcification of the  aortic valve. There is mild thickening of the aortic valve. Aortic valve  regurgitation is not visualized. Aortic valve sclerosis/calcification is  present, without any evidence of  aortic stenosis.   6. The inferior vena cava is normal in size with greater than 50%  respiratory variability, suggesting right atrial pressure of 3 mmHg.   Comparison(s): No significant change from prior study. Prior images  reviewed side by side.     [place], [facility]/Anesthesiology Phone [phone] 07/31/2023 2:24 PM

## 2023-08-02 DIAGNOSIS — Z85828 Personal history of other malignant neoplasm of skin: Secondary | ICD-10-CM | POA: Diagnosis not present

## 2023-08-02 DIAGNOSIS — Z08 Encounter for follow-up examination after completed treatment for malignant neoplasm: Secondary | ICD-10-CM | POA: Diagnosis not present

## 2023-08-02 DIAGNOSIS — D224 Melanocytic nevi of scalp and neck: Secondary | ICD-10-CM | POA: Diagnosis not present

## 2023-08-02 DIAGNOSIS — L821 Other seborrheic keratosis: Secondary | ICD-10-CM | POA: Diagnosis not present

## 2023-08-02 DIAGNOSIS — D225 Melanocytic nevi of trunk: Secondary | ICD-10-CM | POA: Diagnosis not present

## 2023-08-02 DIAGNOSIS — L570 Actinic keratosis: Secondary | ICD-10-CM | POA: Diagnosis not present

## 2023-08-02 DIAGNOSIS — Z09 Encounter for follow-up examination after completed treatment for conditions other than malignant neoplasm: Secondary | ICD-10-CM | POA: Diagnosis not present

## 2023-08-02 DIAGNOSIS — L814 Other melanin hyperpigmentation: Secondary | ICD-10-CM | POA: Diagnosis not present

## 2023-08-02 DIAGNOSIS — D2261 Melanocytic nevi of right upper limb, including shoulder: Secondary | ICD-10-CM | POA: Diagnosis not present

## 2023-08-07 ENCOUNTER — Observation Stay (HOSPITAL_COMMUNITY)
Admission: RE | Admit: 2023-08-07 | Discharge: 2023-08-08 | Disposition: A | Discharge status: Home / Self Care | Attending: Neurological Surgery | Admitting: Neurological Surgery

## 2023-08-07 ENCOUNTER — Ambulatory Visit (HOSPITAL_COMMUNITY)

## 2023-08-07 ENCOUNTER — Encounter (HOSPITAL_COMMUNITY): Payer: Self-pay | Admitting: Neurological Surgery

## 2023-08-07 ENCOUNTER — Other Ambulatory Visit: Payer: Self-pay

## 2023-08-07 ENCOUNTER — Encounter (HOSPITAL_COMMUNITY)
Admission: RE | Disposition: A | Discharge status: Home / Self Care | Payer: Self-pay | Source: Home / Self Care | Attending: Neurological Surgery

## 2023-08-07 ENCOUNTER — Ambulatory Visit (HOSPITAL_COMMUNITY): Payer: Self-pay | Admitting: Physician Assistant

## 2023-08-07 DIAGNOSIS — M48062 Spinal stenosis, lumbar region with neurogenic claudication: Secondary | ICD-10-CM | POA: Diagnosis not present

## 2023-08-07 DIAGNOSIS — Z981 Arthrodesis status: Secondary | ICD-10-CM | POA: Diagnosis not present

## 2023-08-07 DIAGNOSIS — I5022 Chronic systolic (congestive) heart failure: Secondary | ICD-10-CM

## 2023-08-07 DIAGNOSIS — Z79899 Other long term (current) drug therapy: Secondary | ICD-10-CM | POA: Diagnosis not present

## 2023-08-07 DIAGNOSIS — I509 Heart failure, unspecified: Secondary | ICD-10-CM | POA: Insufficient documentation

## 2023-08-07 DIAGNOSIS — I4891 Unspecified atrial fibrillation: Secondary | ICD-10-CM | POA: Diagnosis not present

## 2023-08-07 DIAGNOSIS — Z85828 Personal history of other malignant neoplasm of skin: Secondary | ICD-10-CM | POA: Diagnosis not present

## 2023-08-07 DIAGNOSIS — Z87891 Personal history of nicotine dependence: Secondary | ICD-10-CM | POA: Diagnosis not present

## 2023-08-07 DIAGNOSIS — Z7982 Long term (current) use of aspirin: Secondary | ICD-10-CM | POA: Diagnosis not present

## 2023-08-07 DIAGNOSIS — M4316 Spondylolisthesis, lumbar region: Secondary | ICD-10-CM | POA: Diagnosis not present

## 2023-08-07 SURGERY — POSTERIOR LUMBAR FUSION 1 LEVEL
Anesthesia: General | Site: Back

## 2023-08-07 MED ORDER — OXYCODONE HCL 5 MG PO TABS
5.0000 mg | ORAL_TABLET | Freq: Once | ORAL | Status: AC | PRN
  Administered 2023-08-07: 5 mg via ORAL

## 2023-08-07 MED ORDER — CHLORHEXIDINE GLUCONATE CLOTH 2 % EX PADS: 6.0000 | MEDICATED_PAD | Freq: Once | CUTANEOUS | Status: DC

## 2023-08-07 MED ORDER — DIPHENHYDRAMINE HCL 25 MG PO CAPS
50.0000 mg | ORAL_CAPSULE | Freq: Once | ORAL | Status: AC
  Administered 2023-08-07: 50 mg via ORAL
  Filled 2023-08-07: qty 2

## 2023-08-07 MED ORDER — PHENYLEPHRINE 80 MCG/ML (10ML) SYRINGE FOR IV PUSH (FOR BLOOD PRESSURE SUPPORT)
PREFILLED_SYRINGE | INTRAVENOUS | Status: AC
  Filled 2023-08-07: qty 10

## 2023-08-07 MED ORDER — ROCURONIUM BROMIDE 10 MG/ML (PF) SYRINGE
PREFILLED_SYRINGE | INTRAVENOUS | Status: DC | PRN
  Administered 2023-08-07: 30 mg via INTRAVENOUS
  Administered 2023-08-07: 50 mg via INTRAVENOUS
  Administered 2023-08-07 (×2): 20 mg via INTRAVENOUS

## 2023-08-07 MED ORDER — SODIUM CHLORIDE 0.9% FLUSH: 3.0000 mL | Freq: Two times a day (BID) | INTRAVENOUS | Status: DC

## 2023-08-07 MED ORDER — HYDRALAZINE HCL 25 MG PO TABS
25.0000 mg | ORAL_TABLET | Freq: Two times a day (BID) | ORAL | Status: DC
  Administered 2023-08-08: 25 mg via ORAL
  Filled 2023-08-07 (×2): qty 1

## 2023-08-07 MED ORDER — THROMBIN 5000 UNITS EX SOLR
OROMUCOSAL | Status: DC | PRN
  Administered 2023-08-07: 5 mL via TOPICAL

## 2023-08-07 MED ORDER — GABAPENTIN 300 MG PO CAPS
300.0000 mg | ORAL_CAPSULE | ORAL | Status: AC
Start: 2023-08-07 — End: 2023-08-07
  Administered 2023-08-07: 300 mg via ORAL
  Filled 2023-08-07: qty 1

## 2023-08-07 MED ORDER — PHENYLEPHRINE 80 MCG/ML (10ML) SYRINGE FOR IV PUSH (FOR BLOOD PRESSURE SUPPORT)
PREFILLED_SYRINGE | INTRAVENOUS | Status: DC | PRN
  Administered 2023-08-07: 80 ug via INTRAVENOUS
  Administered 2023-08-07: 160 ug via INTRAVENOUS
  Administered 2023-08-07: 200 ug via INTRAVENOUS
  Administered 2023-08-07: 160 ug via INTRAVENOUS

## 2023-08-07 MED ORDER — EPHEDRINE 5 MG/ML INJ
INTRAVENOUS | Status: AC
  Filled 2023-08-07: qty 5

## 2023-08-07 MED ORDER — SODIUM CHLORIDE 0.9 % IV SOLN: INTRAVENOUS | Status: DC | PRN

## 2023-08-07 MED ORDER — METHOCARBAMOL 1000 MG/10ML IJ SOLN: 500.0000 mg | Freq: Four times a day (QID) | INTRAMUSCULAR | Status: DC | PRN

## 2023-08-07 MED ORDER — GABAPENTIN 300 MG PO CAPS
300.0000 mg | ORAL_CAPSULE | Freq: Three times a day (TID) | ORAL | Status: DC
  Administered 2023-08-07 – 2023-08-08 (×3): 300 mg via ORAL
  Filled 2023-08-07 (×3): qty 1

## 2023-08-07 MED ORDER — ALBUMIN HUMAN 5 % IV SOLN: INTRAVENOUS | Status: DC | PRN

## 2023-08-07 MED ORDER — ORAL CARE MOUTH RINSE: 15.0000 mL | Freq: Once | OROMUCOSAL | Status: AC

## 2023-08-07 MED ORDER — ROCURONIUM BROMIDE 10 MG/ML (PF) SYRINGE
PREFILLED_SYRINGE | INTRAVENOUS | Status: AC
  Filled 2023-08-07: qty 20

## 2023-08-07 MED ORDER — CHLORHEXIDINE GLUCONATE 0.12 % MT SOLN
15.0000 mL | Freq: Once | OROMUCOSAL | Status: AC
  Administered 2023-08-07: 15 mL via OROMUCOSAL
  Filled 2023-08-07: qty 15

## 2023-08-07 MED ORDER — BUPIVACAINE HCL (PF) 0.25 % IJ SOLN
INTRAMUSCULAR | Status: DC | PRN
  Administered 2023-08-07: 7 mL

## 2023-08-07 MED ORDER — ADULT MULTIVITAMIN W/MINERALS CH
1.0000 | ORAL_TABLET | Freq: Every morning | ORAL | Status: DC
  Administered 2023-08-08: 1 via ORAL
  Filled 2023-08-07: qty 1

## 2023-08-07 MED ORDER — PHENOL 1.4 % MT LIQD: 1.0000 | OROMUCOSAL | Status: DC | PRN

## 2023-08-07 MED ORDER — MAGNESIUM OXIDE -MG SUPPLEMENT 400 (240 MG) MG PO TABS
400.0000 mg | ORAL_TABLET | Freq: Every morning | ORAL | Status: DC
  Administered 2023-08-08: 400 mg via ORAL
  Filled 2023-08-07: qty 1

## 2023-08-07 MED ORDER — DEXAMETHASONE SODIUM PHOSPHATE 10 MG/ML IJ SOLN
INTRAMUSCULAR | Status: AC
  Filled 2023-08-07: qty 1

## 2023-08-07 MED ORDER — FENTANYL CITRATE (PF) 250 MCG/5ML IJ SOLN
INTRAMUSCULAR | Status: DC | PRN
  Administered 2023-08-07 (×2): 50 ug via INTRAVENOUS

## 2023-08-07 MED ORDER — FINASTERIDE 5 MG PO TABS
5.0000 mg | ORAL_TABLET | Freq: Every day | ORAL | Status: DC
  Administered 2023-08-07: 5 mg via ORAL
  Filled 2023-08-07: qty 1

## 2023-08-07 MED ORDER — ACETAMINOPHEN 500 MG PO TABS
1000.0000 mg | ORAL_TABLET | ORAL | Status: AC
  Administered 2023-08-07: 1000 mg via ORAL
  Filled 2023-08-07: qty 2

## 2023-08-07 MED ORDER — SUGAMMADEX SODIUM 200 MG/2ML IV SOLN
INTRAVENOUS | Status: DC | PRN
  Administered 2023-08-07: 150 mg via INTRAVENOUS
  Administered 2023-08-07: 50 mg via INTRAVENOUS

## 2023-08-07 MED ORDER — PROPOFOL 10 MG/ML IV BOLUS
INTRAVENOUS | Status: AC
  Filled 2023-08-07: qty 20

## 2023-08-07 MED ORDER — SODIUM CHLORIDE 0.9% FLUSH: 3.0000 mL | INTRAVENOUS | Status: DC | PRN

## 2023-08-07 MED ORDER — ZINC SULFATE 220 (50 ZN) MG PO CAPS
220.0000 mg | ORAL_CAPSULE | Freq: Every day | ORAL | Status: DC
  Administered 2023-08-08: 220 mg via ORAL
  Filled 2023-08-07: qty 1

## 2023-08-07 MED ORDER — SODIUM CHLORIDE 0.9% FLUSH
3.0000 mL | Freq: Two times a day (BID) | INTRAVENOUS | Status: DC
  Administered 2023-08-07: 3 mL via INTRAVENOUS

## 2023-08-07 MED ORDER — SUCCINYLCHOLINE CHLORIDE 200 MG/10ML IV SOSY: PREFILLED_SYRINGE | INTRAVENOUS | Status: DC | PRN

## 2023-08-07 MED ORDER — ONDANSETRON HCL 4 MG/2ML IJ SOLN
INTRAMUSCULAR | Status: AC
  Filled 2023-08-07: qty 2

## 2023-08-07 MED ORDER — PHENYLEPHRINE HCL-NACL 20-0.9 MG/250ML-% IV SOLN
INTRAVENOUS | Status: DC | PRN
  Administered 2023-08-07: 40 ug/min via INTRAVENOUS

## 2023-08-07 MED ORDER — METHOCARBAMOL 500 MG PO TABS: 500.0000 mg | ORAL_TABLET | Freq: Four times a day (QID) | ORAL | Status: DC | PRN

## 2023-08-07 MED ORDER — DEXMEDETOMIDINE HCL IN NACL 200 MCG/50ML IV SOLN
INTRAVENOUS | Status: DC | PRN
  Administered 2023-08-07: 4 ug via INTRAVENOUS

## 2023-08-07 MED ORDER — CELECOXIB 200 MG PO CAPS
200.0000 mg | ORAL_CAPSULE | Freq: Two times a day (BID) | ORAL | Status: DC
  Administered 2023-08-07 – 2023-08-08 (×3): 200 mg via ORAL
  Filled 2023-08-07 (×3): qty 1

## 2023-08-07 MED ORDER — ONDANSETRON HCL 4 MG/2ML IJ SOLN: 4.0000 mg | Freq: Four times a day (QID) | INTRAMUSCULAR | Status: DC | PRN

## 2023-08-07 MED ORDER — THROMBIN 5000 UNITS EX KIT
PACK | CUTANEOUS | Status: AC
  Filled 2023-08-07: qty 1

## 2023-08-07 MED ORDER — OXYCODONE HCL 5 MG/5ML PO SOLN: 5.0000 mg | Freq: Once | ORAL | Status: AC | PRN

## 2023-08-07 MED ORDER — DEXAMETHASONE SODIUM PHOSPHATE 10 MG/ML IJ SOLN
INTRAMUSCULAR | Status: DC | PRN
  Administered 2023-08-07: 4 mg via INTRAVENOUS

## 2023-08-07 MED ORDER — LIDOCAINE 2% (20 MG/ML) 5 ML SYRINGE
INTRAMUSCULAR | Status: DC | PRN
  Administered 2023-08-07: 60 mg via INTRAVENOUS

## 2023-08-07 MED ORDER — FENTANYL CITRATE (PF) 100 MCG/2ML IJ SOLN
25.0000 ug | INTRAMUSCULAR | Status: DC | PRN
  Administered 2023-08-07: 25 ug via INTRAVENOUS
  Administered 2023-08-07: 50 ug via INTRAVENOUS
  Administered 2023-08-07: 25 ug via INTRAVENOUS

## 2023-08-07 MED ORDER — FENTANYL CITRATE (PF) 100 MCG/2ML IJ SOLN
INTRAMUSCULAR | Status: AC
  Filled 2023-08-07: qty 2

## 2023-08-07 MED ORDER — HYDROMORPHONE HCL 1 MG/ML IJ SOLN
0.2500 mg | INTRAMUSCULAR | Status: DC | PRN
  Administered 2023-08-07: 0.5 mg via INTRAVENOUS

## 2023-08-07 MED ORDER — BUPIVACAINE HCL (PF) 0.25 % IJ SOLN
INTRAMUSCULAR | Status: AC
  Filled 2023-08-07: qty 30

## 2023-08-07 MED ORDER — THROMBIN 20000 UNITS EX SOLR
CUTANEOUS | Status: DC | PRN
  Administered 2023-08-07: 20 mL via TOPICAL

## 2023-08-07 MED ORDER — SODIUM CHLORIDE 0.9 % IV SOLN
250.0000 mL | INTRAVENOUS | Status: AC
  Administered 2023-08-07: 250 mL via INTRAVENOUS

## 2023-08-07 MED ORDER — TAMSULOSIN HCL 0.4 MG PO CAPS
0.4000 mg | ORAL_CAPSULE | Freq: Every day | ORAL | Status: DC
  Administered 2023-08-07: 0.4 mg via ORAL
  Filled 2023-08-07: qty 1

## 2023-08-07 MED ORDER — 0.9 % SODIUM CHLORIDE (POUR BTL) OPTIME
TOPICAL | Status: DC | PRN
  Administered 2023-08-07: 1000 mL

## 2023-08-07 MED ORDER — HYDROMORPHONE HCL 1 MG/ML IJ SOLN
INTRAMUSCULAR | Status: AC
Start: 2023-08-07 — End: 2023-08-08
  Filled 2023-08-07: qty 1

## 2023-08-07 MED ORDER — FENTANYL CITRATE (PF) 250 MCG/5ML IJ SOLN
INTRAMUSCULAR | Status: AC
  Filled 2023-08-07: qty 5

## 2023-08-07 MED ORDER — LACTATED RINGERS IV SOLN: INTRAVENOUS | Status: DC

## 2023-08-07 MED ORDER — ASPIRIN 81 MG PO TBEC: 81.0000 mg | DELAYED_RELEASE_TABLET | Freq: Every day | ORAL | Status: DC

## 2023-08-07 MED ORDER — CEFAZOLIN SODIUM-DEXTROSE 2-4 GM/100ML-% IV SOLN
2.0000 g | INTRAVENOUS | Status: AC
  Administered 2023-08-07: 2 g via INTRAVENOUS
  Filled 2023-08-07: qty 100

## 2023-08-07 MED ORDER — THROMBIN 20000 UNITS EX SOLR
CUTANEOUS | Status: AC
  Filled 2023-08-07: qty 20000

## 2023-08-07 MED ORDER — OXYCODONE HCL 5 MG PO TABS
5.0000 mg | ORAL_TABLET | ORAL | Status: DC | PRN
  Filled 2023-08-07: qty 1

## 2023-08-07 MED ORDER — SENNA 8.6 MG PO TABS
1.0000 | ORAL_TABLET | Freq: Two times a day (BID) | ORAL | Status: DC
  Administered 2023-08-07 – 2023-08-08 (×3): 8.6 mg via ORAL
  Filled 2023-08-07 (×3): qty 1

## 2023-08-07 MED ORDER — OXYCODONE HCL 5 MG PO TABS
ORAL_TABLET | ORAL | Status: AC
  Filled 2023-08-07: qty 1

## 2023-08-07 MED ORDER — PROPOFOL 10 MG/ML IV BOLUS
INTRAVENOUS | Status: DC | PRN
  Administered 2023-08-07: 20 mg via INTRAVENOUS
  Administered 2023-08-07: 80 mg via INTRAVENOUS
  Administered 2023-08-07 (×2): 40 mg via INTRAVENOUS
  Administered 2023-08-07: 20 mg via INTRAVENOUS

## 2023-08-07 MED ORDER — CEFAZOLIN SODIUM-DEXTROSE 2-4 GM/100ML-% IV SOLN
2.0000 g | Freq: Three times a day (TID) | INTRAVENOUS | Status: AC
  Administered 2023-08-07 (×2): 2 g via INTRAVENOUS
  Filled 2023-08-07 (×2): qty 100

## 2023-08-07 MED ORDER — EPHEDRINE SULFATE (PRESSORS) 50 MG/ML IJ SOLN
INTRAMUSCULAR | Status: DC | PRN
  Administered 2023-08-07: 10 mg via INTRAVENOUS

## 2023-08-07 MED ORDER — MENTHOL 3 MG MT LOZG: 1.0000 | LOZENGE | OROMUCOSAL | Status: DC | PRN

## 2023-08-07 MED ORDER — ACETAMINOPHEN 500 MG PO TABS
1000.0000 mg | ORAL_TABLET | Freq: Four times a day (QID) | ORAL | Status: AC
  Administered 2023-08-07 – 2023-08-08 (×4): 1000 mg via ORAL
  Filled 2023-08-07 (×5): qty 2

## 2023-08-07 MED ORDER — ONDANSETRON HCL 4 MG PO TABS: 4.0000 mg | ORAL_TABLET | Freq: Four times a day (QID) | ORAL | Status: DC | PRN

## 2023-08-07 MED ORDER — ONDANSETRON HCL 4 MG/2ML IJ SOLN
INTRAMUSCULAR | Status: DC | PRN
  Administered 2023-08-07: 4 mg via INTRAVENOUS

## 2023-08-07 MED ORDER — MORPHINE SULFATE (PF) 2 MG/ML IV SOLN: 2.0000 mg | INTRAVENOUS | Status: DC | PRN

## 2023-08-07 SURGICAL SUPPLY — 55 items
ALLOGRAFT BONE FIBER KORE 5 (Bone Implant) IMPLANT
BAG COUNTER SPONGE SURGICOUNT (BAG) ×1 IMPLANT
BASKET BONE COLLECTION (BASKET) ×1 IMPLANT
BENZOIN TINCTURE PRP APPL 2/3 (GAUZE/BANDAGES/DRESSINGS) ×1 IMPLANT
BLADE BONE MILL MEDIUM (MISCELLANEOUS) ×1 IMPLANT
BLADE CLIPPER SURG (BLADE) IMPLANT
BUR CARBIDE MATCH 3.0 (BURR) ×1 IMPLANT
CANISTER SUCTION 3000ML PPV (SUCTIONS) ×1 IMPLANT
CLEANSER WND VASHE 34 (WOUND CARE) ×1 IMPLANT
CNTNR URN SCR LID CUP LEK RST (MISCELLANEOUS) ×1 IMPLANT
COVER BACK TABLE 60X90IN (DRAPES) ×1 IMPLANT
DERMABOND ADVANCED .7 DNX12 (GAUZE/BANDAGES/DRESSINGS) ×1 IMPLANT
DRAPE C-ARM 42X72 X-RAY (DRAPES) ×2 IMPLANT
DRAPE C-ARMOR (DRAPES) ×1 IMPLANT
DRAPE LAPAROTOMY 100X72X124 (DRAPES) ×1 IMPLANT
DRAPE SURG 17X23 STRL (DRAPES) ×1 IMPLANT
DRSG OPSITE POSTOP 4X6 (GAUZE/BANDAGES/DRESSINGS) IMPLANT
DURAPREP 26ML APPLICATOR (WOUND CARE) ×1 IMPLANT
ELECTRODE REM PT RTRN 9FT ADLT (ELECTROSURGICAL) ×1 IMPLANT
EVACUATOR 1/8 PVC DRAIN (DRAIN) ×1 IMPLANT
GAUZE 4X4 16PLY ~~LOC~~+RFID DBL (SPONGE) IMPLANT
GLOVE BIO SURGEON STRL SZ7 (GLOVE) IMPLANT
GLOVE BIO SURGEON STRL SZ8 (GLOVE) ×2 IMPLANT
GLOVE BIOGEL PI IND STRL 7.0 (GLOVE) IMPLANT
GOWN STRL REUS W/ TWL LRG LVL3 (GOWN DISPOSABLE) IMPLANT
GOWN STRL REUS W/ TWL XL LVL3 (GOWN DISPOSABLE) ×2 IMPLANT
GOWN STRL REUS W/TWL 2XL LVL3 (GOWN DISPOSABLE) IMPLANT
HEMOSTAT POWDER KIT SURGIFOAM (HEMOSTASIS) ×1 IMPLANT
KIT BASIN OR (CUSTOM PROCEDURE TRAY) ×1 IMPLANT
KIT TURNOVER KIT B (KITS) ×1 IMPLANT
MILL BONE PREP (MISCELLANEOUS) ×1 IMPLANT
NDL HYPO 25X1 1.5 SAFETY (NEEDLE) ×1 IMPLANT
NEEDLE HYPO 25X1 1.5 SAFETY (NEEDLE) ×1 IMPLANT
NS IRRIG 1000ML POUR BTL (IV SOLUTION) ×1 IMPLANT
PACK LAMINECTOMY NEURO (CUSTOM PROCEDURE TRAY) ×1 IMPLANT
PAD ARMBOARD POSITIONER FOAM (MISCELLANEOUS) ×3 IMPLANT
PATTIES SURGICAL .5 X.5 (GAUZE/BANDAGES/DRESSINGS) ×1 IMPLANT
PATTIES SURGICAL 1X1 (DISPOSABLE) ×1 IMPLANT
ROD LORD LIPPED TI 5.5X40 (Rod) IMPLANT
SCREW CANC SHANK MOD 6.5X45 (Screw) IMPLANT
SCREW POLYAXIAL TULIP (Screw) IMPLANT
SCREW SHANK MOD 6.5X45 (Screw) IMPLANT
SET SCREW SPNE (Screw) IMPLANT
SPACER IDENTITI PS 9X9X25 15D (Spacer) IMPLANT
SPONGE SURGIFOAM ABS GEL 100 (HEMOSTASIS) ×1 IMPLANT
SPONGE T-LAP 4X18 ~~LOC~~+RFID (SPONGE) IMPLANT
STRIP CLOSURE SKIN 1/2X4 (GAUZE/BANDAGES/DRESSINGS) ×1 IMPLANT
SUT VIC AB 0 CT1 18XCR BRD8 (SUTURE) ×1 IMPLANT
SUT VIC AB 2-0 CP2 18 (SUTURE) ×1 IMPLANT
SUT VIC AB 3-0 SH 8-18 (SUTURE) ×2 IMPLANT
SYR 30ML SLIP (SYRINGE) ×1 IMPLANT
TOWEL GREEN STERILE (TOWEL DISPOSABLE) ×1 IMPLANT
TOWEL GREEN STERILE FF (TOWEL DISPOSABLE) ×1 IMPLANT
TRAY FOLEY MTR SLVR 16FR STAT (SET/KITS/TRAYS/PACK) IMPLANT
WATER STERILE IRR 1000ML POUR (IV SOLUTION) ×1 IMPLANT

## 2023-08-07 NOTE — H&P (Signed)
 Subjective: Patient is a 78 y.o. male admitted for back and leg pain. Onset of symptoms was several months ago, gradually worsening since that time.  The pain is rated severe, and is located at the across the lower back and radiates to legs. The pain is described as aching and occurs all day. The symptoms have been progressive. Symptoms are exacerbated by exercise, standing, and walking for more than a few minutes. 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  Elevated LVEDP.;  09/2020: EF 20-25%.  GR 1 DD   Numbness    hands   Personal history of arthritis    Personal history of other diseases of circulatory system    Pre-diabetes    Sickle cell anemia (HCC)    pt denies    Past Surgical History:  Procedure Laterality Date   ANTERIOR CERVICAL DECOMP/DISCECTOMY FUSION N/A 04/20/2018   Procedure: Anterior Cervical Decompression Fusion - Cervical Three-Cervical Four - Cervical Four-Cervical Five - Cervical Five-Cervical Six;  Surgeon: Isadora Mar, MD;  Location: [facility] OR;  Service: Neurosurgery;  Laterality: N/A;  Anterior Cervical Decompression Fusion - Cervical Three-Cervical Four - Cervical Four-Cervical Five - Cervical Five-Cervical Six   APPENDECTOMY  AS CHILD   BACK SURGERY     BIV ICD INSERTION CRT-D N/A  10/27/2021   Procedure: BIV ICD INSERTION CRT-D;  Surgeon: Tammie Fall, MD;  Location: [facility] INVASIVE CV LAB;  Service: Cardiovascular;  Laterality: N/A;   CARDIOVASCULAR STRESS TEST  10/05/1998   MILD GLOBAL HYPOKINESIS AND ISCHEMIAIN ANTEROSEPTAL  AT [facility]/ EF 42%   CARPAL TUNNEL RELEASE Bilateral    CENTRAL LINE INSERTION  10/28/2021   Procedure: CENTRAL LINE INSERTION;  Surgeon: Lei Pump, MD;  Location: [facility] INVASIVE CV LAB;  Service: Cardiovascular;;   CENTRAL LINE INSERTION  10/28/2021   Procedure: CENTRAL LINE INSERTION;  Surgeon: Arnoldo Lapping, MD;  Location: [facility] INVASIVE CV LAB;  Service: Cardiovascular;;   COLONOSCOPY WITH PROPOFOL  N/A 03/14/2022   Procedure: COLONOSCOPY WITH PROPOFOL ;  Surgeon: Sergio Dandy, MD;  Location: [facility] ENDOSCOPY;  Service: Gastroenterology;  Laterality: N/A;   EXCISION MELANOMA LEFT UPPER BACK  10/14/2009   eye lid surgery Bilateral 2018   GOLD SEED IMPLANT N/A 07/23/2021   Procedure: GOLD SEED IMPLANT;  Surgeon: Andrez Banker, MD;  Location: [facility] ORS;  Service: Urology;  Laterality: N/A;   KNEE ARTHROSCOPY WITH SUBCHONDROPLASTY Left 04/07/2017   Procedure: LEFT KNEE ARTHROSCOPY WITH PARTIAL MEDIAL MENISCECTOMY AND MEDIAL TIBIAL SUBCHONDROPLASTY;  Surgeon: Arnie Lao, MD;  Location: [facility] ORS;  Service: Orthopedics;  Laterality: Left;   knee injections Bilateral    LEAD REVISION/REPAIR N/A 10/28/2021   Procedure: LEAD REVISION/REPAIR;  Surgeon: Lei Pump, MD;  Location: [facility]  INVASIVE CV LAB;  Service: Cardiovascular;  Laterality: N/A;   LEFT HEART CATH AND CORONARY ANGIOGRAPHY  04/2002   minimal irregularities in the LAD/  EF 50%  -   DR AL LITTLE   LUMBAR DISC SURGERY  09/12/2000   left  L5 -- S1   PERICARDIOCENTESIS N/A 10/28/2021   Procedure: PERICARDIOCENTESIS;  Surgeon: Lei Pump, MD;  Location: [facility] INVASIVE CV LAB;  Service: Cardiovascular;  Laterality: N/A;   PERICARDIOCENTESIS N/A 10/28/2021    Procedure: PERICARDIOCENTESIS;  Surgeon: Arnoldo Lapping, MD;  Location: [facility] INVASIVE CV LAB;  Service: Cardiovascular;  Laterality: N/A;   PROSTATE BIOPSY N/A 05/22/2013   Procedure: BIOPSY TRANSRECTAL ULTRASONIC PROSTATE (TUBP);  Surgeon: Livingston Rigg, MD;  Location: [facility];  Service: Urology;  Laterality: N/A;   RIGHT/LEFT HEART CATH AND CORONARY ANGIOGRAPHY N/A 06/25/2020   Procedure: RIGHT/LEFT HEART CATH AND CORONARY ANGIOGRAPHY;  Surgeon: Arty Binning, MD;  Location: [facility] INVASIVE CV LAB;:; Normal coronary arteries; LVEDP 23 mmHg; PCWP 19 mmHg.   ROTATOR CUFF REPAIR Left 2016   SPACE OAR INSTILLATION N/A 07/23/2021   Procedure: SPACE OAR INSTILLATION;  Surgeon: Andrez Banker, MD;  Location: [facility] ORS;  Service: Urology;  Laterality: N/A;   THROAT SURGERY  04/20/2018   TRANSRECTAL ULTRASOUND PROSTATE BX  05-23-2005  &  04-19-2001   TRANSTHORACIC ECHOCARDIOGRAM  09/2017   EF 45-50 % (previously reported as 40 to 45%).  Incoordinate septal motion with mild LVH.  GR 1 DD.  Aortic sclerosis but no stenosis.   TRANSTHORACIC ECHOCARDIOGRAM  10/06/2020   Severely reduced EF of 20 to 25%.  No LV thrombus.  Severe septal-lateral wall systolic dyssynchrony due to LBBB.  Global HK.  Moderately dilated LV.  GR 1 DD with mild LA dilation..  Unable to assess PAP with normal RV size and function.  Normal RAP.  Mild AOV sclerosis but no stenosis.  Mild to moderate MR.   TRANSTHORACIC ECHOCARDIOGRAM  06/16/2020   Severely reduced EF <20%.  Moderate severely dilated LV.  GR 2 DD.  Elevated LVEDP.  Mild LA dilation.  Mildly reduced RV function.  Mild MR.  Mild aortic valve calcification    Prior to Admission medications   Medication Sig Start Date End Date Taking? Authorizing Provider  acetaminophen  (TYLENOL ) 500 MG tablet Take 1,000 mg by mouth every 6 (six) hours as needed for headache.   Yes [provider]  aspirin  EC 81 MG tablet Take 1 tablet (81 mg total) by mouth  daily. 04/26/18  Yes Costella, Thedora Finlay, PA-C  colchicine  0.6 MG tablet TAKE 1 TABLET TWICE DAILY Patient taking differently: Take 0.6 mg by mouth 2 (two) times daily as needed (gout flares.). 12/27/22  Yes Benjiman Bras, MD  Cyanocobalamin (VITAMIN B-12 PO) Take 1,000 mcg by mouth in the morning.   Yes [provider]  diphenhydrAMINE  (BENADRYL ) 25 MG tablet Take 50 mg by mouth at bedtime.   Yes [provider]  finasteride (PROSCAR) 5 MG tablet Take 5 mg by mouth at bedtime.   Yes [provider]  furosemide  (LASIX ) 40 MG tablet TAKE 1 TABLET TWICE DAILY Patient taking differently: Take 40 mg by mouth 2 (two) times a week. 01/10/22  Yes Arleen Lacer, MD  gabapentin  (NEURONTIN ) 300 MG capsule Take 1-2 capsules (300-600 mg total) by mouth 3 (three) times daily as needed (pain). Patient taking differently: Take 900 mg by mouth in the morning, at noon, and at bedtime. 02/27/23  Yes Benjiman Bras, MD  hydrALAZINE  (APRESOLINE ) 25 MG tablet Take 25 mg by mouth in the morning and at bedtime.   Yes [provider]  MAGNESIUM PO Take 400 mg by mouth in the morning.   Yes [provider]  Multiple Vitamin (MULTIVITAMIN WITH MINERALS) TABS tablet Take 1 tablet by mouth in the morning. One-A-Day Men Health Multivitamin   Yes [provider]  naproxen  sodium (ALEVE ) 220 MG tablet Take 440 mg by mouth daily as needed (knee pain).   Yes [provider]  Omega-3 Fatty Acids (FISH OIL PO) Take 1,000 mg by mouth in the morning.   Yes [provider]  rosuvastatin  (CRESTOR ) 5 MG tablet Take 1 tablet (5 mg total) by mouth every Monday, Wednesday, and Friday. Patient taking differently: Take 5 mg by mouth every Monday, Wednesday, and Friday. At night 01/25/23 07/26/26 Yes West, Katlyn D, NP  tamsulosin  (FLOMAX ) 0.4 MG CAPS capsule TAKE 1 CAPSULE EVERY DAY Patient taking differently: Take 0.4 mg by mouth at bedtime. 08/04/22  Yes Benjiman Bras, MD  Turmeric (QC TUMERIC COMPLEX PO) Take 1 tablet by mouth in the morning.   Yes [provider]  zinc sulfate, 50mg  elemental zinc, 220 (50 Zn) MG capsule Take 220 mg by mouth daily.   Yes [provider]   Allergies  Allergen Reactions   Nitrostat [Nitroglycerin] Anaphylaxis    "Cardiologist suggested he shouldn't take this med because it could kill him with his heart condition. Lowers BP"   Ace Inhibitors Swelling   Mevacor  [Lovastatin ] Nausea Only   Solarcaine [Benzocaine] Dermatitis    Social History   Tobacco Use   Smoking status: Former    Current packs/day: 0.00    Average packs/day: 2.0 packs/day for 20.0 years (40.0 ttl pk-yrs)    Types: Cigarettes    Start date: 03/28/1960    Quit date: 03/28/1980    Years since quitting: 43.3   Smokeless tobacco: Never  Substance Use Topics   Alcohol  use: No    Alcohol /week: 0.0 standard drinks of alcohol     Family History  Problem Relation Age of Onset   Arthritis Mother    COPD Father    Lung cancer Father    Cancer Sister        type unknown   Breast cancer Sister    Lung cancer Sister    Cardiomyopathy Other        idiopathic. trivial disease 2004 cath, 2010 cardiac CT - no sig. dz, nl LV fxn. cards: Little   Colon cancer Neg Hx      Review of Systems  Positive ROS: neg  All other systems have been reviewed and were otherwise negative with the exception of those mentioned in the HPI and as above.  Objective: Vital signs in last 24 hours: Temp:  [98.2 F (36.8 C)] (P) 98.2 F (36.8 C) (05/12 0551) Pulse Rate:  [88] (P) 88 (05/12 0551) Resp:  [18] (P) 18 (05/12 0551) BP: (P) 127/67 (05/12 0551) SpO2:  [95 %] (P) 95 % (05/12 0551) Weight:  [89.3 kg] (P) 89.3 kg (05/12 0551)  General Appearance: Alert, cooperative, no distress, appears stated age Head: Normocephalic, without obvious abnormality, atraumatic Eyes: PERRL, conjunctiva/corneas clear, EOM's intact    Neck: Supple,  symmetrical, trachea midline Back: Symmetric, no curvature, ROM normal, no CVA tenderness Lungs:  respirations unlabored Heart: Regular rate and rhythm Abdomen: Soft, non-tender Extremities: Extremities normal, atraumatic, no cyanosis or edema Pulses: 2+ and  symmetric all extremities Skin: Skin color, texture, turgor normal, no rashes or lesions  NEUROLOGIC:   Mental status: Alert and oriented x4,  no aphasia, good attention span, fund of knowledge, and memory Motor Exam - grossly normal Sensory Exam - grossly normal Reflexes: 1+ Coordination - grossly normal Gait - grossly normal Balance - grossly normal Cranial Nerves: I: smell Not tested  II: visual acuity  OS: nl    OD: nl  II: visual fields Full to confrontation  II: pupils Equal, round, reactive to light  III,VII: ptosis None  III,IV,VI: extraocular muscles  Full ROM  V: mastication Normal  V: facial light touch sensation  Normal  V,VII: corneal reflex  Present  VII: facial muscle function - upper  Normal  VII: facial muscle function - lower Normal  VIII: hearing Not tested  IX: soft palate elevation  Normal  IX,X: gag reflex Present  XI: trapezius strength  5/5  XI: sternocleidomastoid strength 5/5  XI: neck flexion strength  5/5  XII: tongue strength  Normal    Data Review Lab Results  Component Value Date   WBC 8.2 07/28/2023   HGB 13.4 07/28/2023   HCT 40.7 07/28/2023   MCV 90.0 07/28/2023   PLT 147 (L) 07/28/2023   Lab Results  Component Value Date   NA 138 07/12/2023   K 4.4 07/12/2023   CL 103 07/12/2023   CO2 28 07/12/2023   BUN 19 07/12/2023   CREATININE 1.06 07/12/2023   GLUCOSE 97 07/12/2023   Lab Results  Component Value Date   INR 1.0 07/28/2023    Assessment/Plan:  Estimated body mass index is 29.5 kg/m (pended) as calculated from the following:   Height as of this encounter: (P) 5' 8.5" (1.74 m).   Weight as of this encounter: (P) 89.3 kg. Patient admitted for PLIF L4-5.  Patient has failed a reasonable attempt at conservative therapy.  I explained the condition and procedure to the patient and answered any questions.  Patient wishes to proceed with procedure as planned. Understands risks/ benefits and typical outcomes of procedure.   Isadora Mar 08/07/2023 7:12 AM

## 2023-08-07 NOTE — Transfer of Care (Signed)
 Immediate Anesthesia Transfer of Care Note  Patient: [name] [name]  Procedure(s) Performed: POSTERIOR LUMBAR FUSION LUMBAR FOUR-FIVE (Back)  Patient Location: PACU  Anesthesia Type:General  Level of Consciousness: awake, drowsy, and patient cooperative  Airway & Oxygen Therapy: Patient Spontanous Breathing and Patient connected to nasal cannula oxygen  Post-op Assessment: Report given to RN and Post -op Vital signs reviewed and stable  Post vital signs: Reviewed and stable  Last Vitals:  Vitals Value Taken Time  BP 126/61 08/07/23 1100  Temp    Pulse 78 08/07/23 1104  Resp 5 08/07/23 1104  SpO2 99 % 08/07/23 1104  Vitals shown include unfiled device data.  Last Pain:  Vitals:   08/07/23 0551  TempSrc: (P) Oral         Complications: No notable events documented.

## 2023-08-07 NOTE — Progress Notes (Signed)
 Orthopedic Tech Progress Note Patient Details:  [name] [name] [birth year] [id number]  Ortho Devices Type of Ortho Device: Lumbar corsett Ortho Device/Splint Location: Back Ortho Device/Splint Interventions: Ordered      [name] [name] [name] 08/07/2023, 1:14 PM

## 2023-08-07 NOTE — Op Note (Signed)
 08/07/2023  10:50 AM  PATIENT:  [name] [name]  78 y.o. 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 Decompressive lumbar laminectomy, hemi facetectomy and foraminotomies L4-5 requiring more work than would be required for a simple exposure of the disk for PLIF in order to adequately decompress the neural elements and address the spinal stenosis 2. Posterior lumbar interbody fusion L4-5 using PTI interbody cages packed with morcellized allograft and autograft  3. Posterior fixation L4-5 using ATEC cortical pedicle screws.  4. Intertransverse arthrodesis L4-5 using morcellized autograft and allograft.  SURGEON:  Waymond [name], MD  ASSISTANTS: Dr. Ellery Guthrie  ANESTHESIA:  General  EBL: 400 ml  Total I/O In: 1150 [I.V.:900; IV Piggyback:250] Out: 550 [Urine:150; Blood:400]  BLOOD ADMINISTERED: None  DRAINS: none   INDICATION FOR PROCEDURE: This patient presented with back pain with neurogenic claudication and weakness in his legs. 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 The patient tried a reasonable attempt at conservative medical measures without relief. I recommended decompression and instrumented fusion to address the stenosis as well as the segmental  instability.  Patient understood the risks, benefits, and alternatives and potential outcomes and wished to proceed.  PROCEDURE DETAILS:  The patient was brought to the operating room. After induction of generalized endotracheal anesthesia the patient was rolled into the prone position on chest rolls and all pressure points were padded. The patient's lumbar region was cleaned and then prepped with DuraPrep and draped in the usual sterile fashion. Anesthesia was injected and then a dorsal midline incision was made and carried down to the lumbosacral fascia. The fascia was opened and the paraspinous  musculature was taken down in a subperiosteal fashion to expose L4-5. A self-retaining retractor was placed. Intraoperative fluoroscopy confirmed my level, and I started with placement of the L4 cortical pedicle screws. The pedicle screw entry zones were identified utilizing surface landmarks and  AP and lateral fluoroscopy. I scored the cortex with the high-speed drill and then used the hand drill to drill an upward and outward direction into the pedicle. I then tapped line to line. I then placed a 6.5 x 45 mm cortical pedicle screw into the pedicles of L4 bilaterally.    I then turned my attention to the decompression and complete lumbar laminectomies, hemi- facetectomies, and foraminotomies were performed at L4-5.  Dr. Ellery Guthrie was directly involved in the decompression and exposure of the neural elements. the patient had significant spinal stenosis and this required more work than would be required for a simple exposure of the disc for posterior lumbar interbody fusion which would only require a limited laminotomy. Much more generous decompression and generous foraminotomy was undertaken in order to adequately decompress the neural elements and address the patient's leg pain. The yellow ligament was removed to expose the underlying dura and nerve roots, and generous foraminotomies were performed to adequately decompress the neural elements. Both the exiting and traversing nerve roots were decompressed on both sides until a coronary dilator passed easily along the nerve roots. Once the decompression was complete, I turned my attention to the posterior lower lumbar interbody fusion. The epidural venous vasculature was coagulated and cut sharply. Disc space was incised and the initial discectomy was performed with pituitary rongeurs. The disc space was distracted with sequential distractors to a height of 10 mm. We then used a series of scrapers and shavers to prepare the endplates for fusion. The midline  was  prepared with Epstein curettes. Once the complete discectomy was finished, we packed an appropriate sized interbody cage with local autograft and morcellized allograft, gently retracted the nerve root, and tapped the cage into position at L4-5.  The midline between the cages was packed with morselized autograft and allograft.   We then turned our attention to the placement of the lower pedicle screws. The pedicle screw entry zones were identified utilizing surface landmarks and fluoroscopy. I drilled into each pedicle utilizing the hand drill, and tapped each pedicle with the appropriate tap. We palpated with a ball probe to assure no break in the cortex. We then placed 6.5 x 45 mm pedicle screws into the pedicles bilaterally at L5.  My nurse practitioner assisted in placement of the pedicle screws.  We then decorticated the transverse processes and laid a mixture of morcellized autograft and allograft out over these to perform intertransverse arthrodesis at L4-5. We then placed lordotic rods into the multiaxial screw heads of the pedicle screws and locked these in position with the locking caps and anti-torque device. We then checked our construct with AP and lateral fluoroscopy. Irrigated with copious amounts of  saline solution. Inspected the nerve roots once again to assure adequate decompression, lined to the dura with Gelfoam,  and then we closed the muscle and the fascia with 0 Vicryl. Closed the subcutaneous tissues with 2-0 Vicryl and subcuticular tissues with 3-0 Vicryl. The skin was closed with benzoin and Steri-Strips. Dressing was then applied, the patient was awakened from general anesthesia and transported to the recovery room in stable condition. At the end of the procedure all sponge, needle and instrument counts were correct.   PLAN OF CARE: admit to inpatient  PATIENT DISPOSITION:  PACU - hemodynamically stable.   Delay start of Pharmacological VTE agent (>24hrs) due to surgical blood loss  or risk of bleeding:  yes

## 2023-08-07 NOTE — Anesthesia Procedure Notes (Signed)
 Arterial Line Insertion Start/End5/02/2024 7:10 AM Performed by: Deserea Bordley, CRNA  Patient location: Pre-op. Preanesthetic checklist: patient identified, IV checked, site marked, risks and benefits discussed, surgical consent, monitors and equipment checked, pre-op evaluation, timeout performed and anesthesia consent Lidocaine  1% used for infiltration Right, radial was placed Catheter size: 20 G Hand hygiene performed  and maximum sterile barriers used   Attempts: 1 Procedure performed without using ultrasound guided technique. Following insertion, dressing applied. Post procedure assessment: normal and unchanged

## 2023-08-08 DIAGNOSIS — Z7982 Long term (current) use of aspirin: Secondary | ICD-10-CM | POA: Diagnosis not present

## 2023-08-08 DIAGNOSIS — M48062 Spinal stenosis, lumbar region with neurogenic claudication: Secondary | ICD-10-CM | POA: Diagnosis not present

## 2023-08-08 DIAGNOSIS — I509 Heart failure, unspecified: Secondary | ICD-10-CM | POA: Diagnosis not present

## 2023-08-08 DIAGNOSIS — M4316 Spondylolisthesis, lumbar region: Secondary | ICD-10-CM | POA: Diagnosis not present

## 2023-08-08 DIAGNOSIS — Z87891 Personal history of nicotine dependence: Secondary | ICD-10-CM | POA: Diagnosis not present

## 2023-08-08 DIAGNOSIS — Z79899 Other long term (current) drug therapy: Secondary | ICD-10-CM | POA: Diagnosis not present

## 2023-08-08 DIAGNOSIS — Z85828 Personal history of other malignant neoplasm of skin: Secondary | ICD-10-CM | POA: Diagnosis not present

## 2023-08-08 MED ORDER — OXYCODONE-ACETAMINOPHEN 5-325 MG PO TABS: 1.0000 | ORAL_TABLET | ORAL | 0 refills | Status: DC | PRN

## 2023-08-08 MED ORDER — METHOCARBAMOL 500 MG PO TABS: 500.0000 mg | ORAL_TABLET | Freq: Four times a day (QID) | ORAL | 1 refills | Status: DC | PRN

## 2023-08-08 MED ORDER — PHENYLEPHRINE 80 MCG/ML (10ML) SYRINGE FOR IV PUSH (FOR BLOOD PRESSURE SUPPORT)
PREFILLED_SYRINGE | INTRAVENOUS | Status: AC
  Filled 2023-08-08: qty 10

## 2023-08-08 MED ORDER — PHENYLEPHRINE HCL-NACL 20-0.9 MG/250ML-% IV SOLN
INTRAVENOUS | Status: AC
  Filled 2023-08-08: qty 500

## 2023-08-08 MED ORDER — PROPOFOL 1000 MG/100ML IV EMUL
INTRAVENOUS | Status: AC
  Filled 2023-08-08: qty 100

## 2023-08-08 NOTE — Care Management Obs Status (Signed)
 MEDICARE OBSERVATION STATUS NOTIFICATION   Patient Details  Name: [name] [name] MRN: [id number] Date of Birth: [birth year]   Medicare Observation Status Notification Given:  Yes    Felix Host 08/08/2023, 9:33 AM

## 2023-08-08 NOTE — Discharge Summary (Signed)
 Physician Discharge Summary  Patient ID: [name] [name] MRN: [id number] DOB/AGE: 79-Oct-1947 79 y.o.  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 The patient tolerated the procedure well and was taken to the recovery room and then to the floor in stable condition. The hospital course was routine. There were no complications. The wound remained clean dry and intact. Pt had appropriate back soreness. No complaints of leg pain or new N/T/W. The patient remained afebrile with stable vital signs, and tolerated a regular diet. The patient continued to increase activities, and pain was well controlled with oral pain medications.   Consults: None  Significant Diagnostic Studies:  Results for orders placed or performed during the hospital encounter of 07/28/23  Surgical pcr screen   Collection Time: 07/28/23 11:42 AM   Specimen: Nasal Mucosa; Nasal Swab  Result Value Ref Range   MRSA, PCR NEGATIVE NEGATIVE   Staphylococcus aureus NEGATIVE NEGATIVE  CBC per protocol   Collection Time: 07/28/23 11:42 AM  Result Value Ref Range   WBC 8.2 4.0 - 10.5 K/uL   RBC 4.52 4.22 - 5.81 MIL/uL   Hemoglobin 13.4 13.0 - 17.0 g/dL   HCT 40.9 81.1 - 91.4 %   MCV 90.0 80.0 - 100.0 fL   MCH 29.6 26.0 - 34.0 pg   MCHC 32.9 30.0 - 36.0 g/dL   RDW 78.2 95.6 - 21.3 %   Platelets 147 (L) 150 - 400 K/uL   nRBC 0.0 0.0 - 0.2 %  Protime-INR   Collection Time: 07/28/23 11:42 AM  Result Value Ref Range   Prothrombin Time 13.0 11.4 - 15.2 seconds   INR 1.0 0.8 - 1.2  Type and screen   Collection Time: 07/28/23 11:50 AM  Result Value Ref Range   ABO/RH(D) A POS    Antibody Screen NEG    Sample Expiration 08/11/2023,2359     Extend sample reason      NO TRANSFUSIONS OR PREGNANCY IN THE PAST 3 MONTHS Performed at [facility], [address] [place], [place] [address]     DG Lumbar Spine 2-3 Views Result Date: 08/07/2023 CLINICAL DATA:  L4-5 laminectomy intraoperative EXAM: LUMBAR SPINE - 2-3 VIEW COMPARISON:  05/24/2023 FINDINGS: Intraoperative images of the lumbar spine demonstrate bilateral posterolateral rod and pedicle screw fixation at L4-5 with interbody spacer. No complicating feature observed. IMPRESSION: 1. Intraoperative images of the lumbar spine demonstrate bilateral posterolateral rod and pedicle screw fixation at L4-5 with interbody spacer. Electronically Signed   By: Freida Jes M.D.   On: 08/07/2023 14:18   DG C-Arm 1-60 Min-No Report Result Date: 08/07/2023 Fluoroscopy was utilized by the requesting physician.  No radiographic interpretation.   DG C-Arm 1-60 Min-No Report Result Date: 08/07/2023 Fluoroscopy was utilized by the requesting physician.  No radiographic interpretation.   DG C-Arm 1-60 Min-No Report Result Date: 08/07/2023 Fluoroscopy was utilized by the requesting physician.  No radiographic interpretation.   CUP PACEART REMOTE DEVICE CHECK Result Date: 07/27/2023 BIV ICD scheduled remote reviewed. Normal device function.  Presenting rhythm: AS-BVP Several AMS events. Longest 1 hr 52 min on 04/28/23. Increase in AF burden on trends during this monitoring period. Not on OAC per Epic. Routing to triage per protocol. HF diagnostics abnormal during  this monitoring period. Next remote 91 days. AB, CVRS  ECHOCARDIOGRAM COMPLETE Result Date: 07/18/2023    ECHOCARDIOGRAM REPORT   Patient Name:   [name] [name] Date of Exam: 07/18/2023 Medical Rec #:  [id number]           Height:       68.0 in Accession #:    [id number]          Weight:       200.2 lb Date of Birth:  Apr 23, 1945            BSA:          2.045 m Patient Age:    78 years            BP:           122/66 mmHg Patient  Gender: M                   HR:           73 bpm. Exam Location:  [address] Procedure: 2D Echo, Cardiac Doppler and Color Doppler (Both Spectral and Color            Flow Doppler were utilized during procedure). Indications:    pre-op  History:        Patient has prior history of Echocardiogram examinations.                 Cardiomyopathy, Pacemaker, Arrythmias:LBBB and Atrial                 Fibrillation; Signs/Symptoms:Hypotension and Chest Pain.  Sonographer:    Alicia Inoue Referring Phys: [phone] DAVID W HARDING IMPRESSIONS  1. Left ventricular ejection fraction, by estimation, is 20 to 25%. The left ventricle has severely decreased function. The left ventricle demonstrates global hypokinesis. The left ventricular internal cavity size was moderately to severely dilated. There is mild eccentric left ventricular hypertrophy. Left ventricular diastolic parameters are consistent with Grade I diastolic dysfunction (impaired relaxation).  2. Right ventricular systolic function is low normal. The right ventricular size is normal. There is normal pulmonary artery systolic pressure. The estimated right ventricular systolic pressure is 23.2 mmHg.  3. Left atrial size was moderately dilated.  4. The mitral valve is normal in structure. Mild mitral valve regurgitation. No evidence of mitral stenosis.  5. The aortic valve is tricuspid. There is mild calcification of the aortic valve. There is mild thickening of the aortic valve. Aortic valve regurgitation is not visualized. Aortic valve sclerosis/calcification is present, without any evidence of aortic stenosis.  6. The inferior vena cava is normal in size with greater than 50% respiratory variability, suggesting right atrial pressure of 3 mmHg. Comparison(s): No significant change from prior study. Prior images reviewed side by side. FINDINGS  Left Ventricle: Left ventricular ejection fraction, by estimation, is 20 to 25%. The left ventricle has severely decreased  function. The left ventricle demonstrates global hypokinesis. The left ventricular internal cavity size was moderately to severely  dilated. There is mild eccentric left ventricular hypertrophy. Abnormal (paradoxical) septal motion, consistent with RV pacemaker. Left ventricular diastolic parameters are consistent with Grade I diastolic dysfunction (impaired relaxation). Normal left  ventricular filling pressure. Right Ventricle: The right ventricular size is normal. No increase in right ventricular wall thickness. Right ventricular systolic function is low normal. There is normal pulmonary artery systolic pressure. The tricuspid regurgitant velocity is 2.25 m/s,  and with an assumed right atrial pressure of 3 mmHg, the estimated right ventricular  systolic pressure is 23.2 mmHg. Left Atrium: Left atrial size was moderately dilated. Right Atrium: Right atrial size was normal in size. Pericardium: There is no evidence of pericardial effusion. Mitral Valve: The mitral valve is normal in structure. Mild mitral valve regurgitation, with centrally-directed jet. No evidence of mitral valve stenosis. Tricuspid Valve: The tricuspid valve is normal in structure. Tricuspid valve regurgitation is trivial. Aortic Valve: The aortic valve is tricuspid. There is mild calcification of the aortic valve. There is mild thickening of the aortic valve. Aortic valve regurgitation is not visualized. Aortic valve sclerosis/calcification is present, without any evidence of aortic stenosis. Aortic valve mean gradient measures 4.5 mmHg. Aortic valve peak gradient measures 7.7 mmHg. Aortic valve area, by VTI measures 1.54 cm. Pulmonic Valve: The pulmonic valve was grossly normal. Pulmonic valve regurgitation is trivial. No evidence of pulmonic stenosis. Aorta: The aortic root and ascending aorta are structurally normal, with no evidence of dilitation. Venous: The inferior vena cava is normal in size with greater than 50% respiratory  variability, suggesting right atrial pressure of 3 mmHg. IAS/Shunts: No atrial level shunt detected by color flow Doppler. Additional Comments: A device lead is visualized in the right ventricle.  LEFT VENTRICLE PLAX 2D LVIDd:         6.23 cm      Diastology LVIDs:         5.88 cm      LV e' medial:    3.81 cm/s LV PW:         1.27 cm      LV E/e' medial:  25.5 LV IVS:        1.23 cm      LV e' lateral:   3.92 cm/s LVOT diam:     2.20 cm      LV E/e' lateral: 24.8 LV SV:         49 LV SV Index:   24 LVOT Area:     3.80 cm  LV Volumes (MOD) LV vol d, MOD A2C: 213.0 ml LV vol d, MOD A4C: 166.0 ml LV vol s, MOD A2C: 161.0 ml LV vol s, MOD A4C: 130.0 ml LV SV MOD A2C:     52.0 ml LV SV MOD A4C:     166.0 ml LV SV MOD BP:      46.4 ml RIGHT VENTRICLE             IVC RV Basal diam:  3.52 cm     IVC diam: 1.61 cm RV Mid diam:    2.53 cm RV S prime:     10.30 cm/s TAPSE (M-mode): 2.2 cm RVSP:           23.2 mmHg LEFT ATRIUM             Index        RIGHT ATRIUM           Index LA Vol (A2C):   66.9 ml 32.72 ml/m  RA Pressure: 3.00 mmHg LA Vol (A4C):   63.7 ml 31.16 ml/m  RA Area:     15.60 cm LA Biplane Vol: 64.8 ml 31.69 ml/m  RA Volume:   45.50 ml  22.25 ml/m  AORTIC VALVE AV Area (Vmax):    1.60 cm AV Area (Vmean):   1.57 cm AV Area (VTI):     1.54 cm AV Vmax:           139.00 cm/s AV Vmean:  95.250 cm/s AV VTI:            0.316 m AV Peak Grad:      7.7 mmHg AV Mean Grad:      4.5 mmHg LVOT Vmax:         58.50 cm/s LVOT Vmean:        39.300 cm/s LVOT VTI:          0.128 m LVOT/AV VTI ratio: 0.41  AORTA Ao Sinus diam: 3.19 cm Ao Asc diam:   3.20 cm MV E velocity: 97.30 cm/s   TRICUSPID VALVE MV A velocity: 132.00 cm/s  TR Peak grad:   20.2 mmHg MV E/A ratio:  0.74         TR Vmax:        225.00 cm/s                             Estimated RAP:  3.00 mmHg                             RVSP:           23.2 mmHg                              SHUNTS                             Systemic VTI:  0.13 m                              Systemic Diam: 2.20 cm Karyl Paget Croitoru MD Electronically signed by Luana Rumple MD Signature Date/Time: 07/18/2023/1:34:32 PM    Final    DG MYELOGRAPHY LUMBAR INJ LUMBOSACRAL Result Date: 07/10/2023 CLINICAL DATA:  Bilateral low back and leg pain and weakness, worse with standing. EXAM: LUMBAR MYELOGRAM FLUOROSCOPY: 1 minutes 16 seconds.  263.06 micro gray meter squared PROCEDURE: After thorough discussion of risks and benefits of the procedure including bleeding, infection, injury to nerves, blood vessels, adjacent structures as well as headache and CSF leak, written and oral informed consent was obtained. Consent was obtained by Dr. Bettylou Brunner. Time out form was completed. Patient was positioned prone on the fluoroscopy table. Local anesthesia was provided with 1% lidocaine  without epinephrine after prepped and draped in the usual sterile fashion. Puncture was performed at L3-4 using a 3 1/2 inch 22-gauge spinal needle via right paramedian approach. Using a single pass through the dura, the needle was placed within the thecal sac, with return of clear CSF. 15 mL of Isovue  M-200 was injected into the thecal sac, with normal opacification of the nerve roots and cauda equina consistent with free flow within the subarachnoid space. I personally performed the lumbar puncture and administered the intrathecal contrast. I also personally performed acquisition of the myelogram images. TECHNIQUE: Contiguous axial images were obtained through the Lumbar spine after the intrathecal infusion of infusion. Coronal and sagittal reconstructions were obtained of the axial image sets. COMPARISON:  Radiography 02/06/2023 FINDINGS: LUMBAR MYELOGRAM FINDINGS: No significant finding at L3-4 or above. At L4-5, there is moderate multifactorial stenosis. There is 2 or 3 mm of anterolisthesis that is worse with standing flexion. Mild bilateral lateral recess narrowing at L5-S1. See results of CT. CT LUMBAR MYELOGRAM  FINDINGS: T11-12: Negative T12-L1: Shallow central disc herniation with upward migration of disc material in the midline behind T12. This indents the thecal sac slightly but does not cause visible neural compression. L1-2: Mild bulging of the disc.  No significant stenosis. L2-3: Normal interspace. L3-4: Minimal disc bulge and facet hypertrophy. No compressive stenosis. L4-5: Chronic facet arthropathy with anterolisthesis of 3 mm. Bulging of the disc. Stenosis of both lateral recesses and proximal foramina that could cause neural compression on either or both sides. L5-S1: Small central to left paracentral disc protrusion. Facet degeneration and hypertrophy. Foraminal stenosis with some potential to affect either or both exiting L5 nerves. IMPRESSION: LUMBAR MYELOGRAM IMPRESSION: Moderate multifactorial stenosis at L4-5. Grade 1 anterolisthesis which worsened slightly with standing flexion. CT LUMBAR MYELOGRAM IMPRESSION: L4-5: Bilateral facet arthropathy with anterolisthesis of 3 mm. Bulging of the disc and facet and ligamentous hypertrophy. Stenosis of both lateral recesses and neural foramina that could cause neural compression on either or both sides. L5-S1: Bilateral facet arthropathy. Mild bulging of the disc. Bilateral foraminal stenosis that could affect either or both L5 nerves. Electronically Signed   By: Bettylou Brunner M.D.   On: 07/10/2023 10:59   CT LUMBAR SPINE W CONTRAST Result Date: 07/10/2023 CLINICAL DATA:  Bilateral low back and leg pain and weakness, worse with standing. EXAM: LUMBAR MYELOGRAM FLUOROSCOPY: 1 minutes 16 seconds.  263.06 micro gray meter squared PROCEDURE: After thorough discussion of risks and benefits of the procedure including bleeding, infection, injury to nerves, blood vessels, adjacent structures as well as headache and CSF leak, written and oral informed consent was obtained. Consent was obtained by Dr. Bettylou Brunner. Time out form was completed. Patient was positioned prone  on the fluoroscopy table. Local anesthesia was provided with 1% lidocaine  without epinephrine after prepped and draped in the usual sterile fashion. Puncture was performed at L3-4 using a 3 1/2 inch 22-gauge spinal needle via right paramedian approach. Using a single pass through the dura, the needle was placed within the thecal sac, with return of clear CSF. 15 mL of Isovue  M-200 was injected into the thecal sac, with normal opacification of the nerve roots and cauda equina consistent with free flow within the subarachnoid space. I personally performed the lumbar puncture and administered the intrathecal contrast. I also personally performed acquisition of the myelogram images. TECHNIQUE: Contiguous axial images were obtained through the Lumbar spine after the intrathecal infusion of infusion. Coronal and sagittal reconstructions were obtained of the axial image sets. COMPARISON:  Radiography 02/06/2023 FINDINGS: LUMBAR MYELOGRAM FINDINGS: No significant finding at L3-4 or above. At L4-5, there is moderate multifactorial stenosis. There is 2 or 3 mm of anterolisthesis that is worse with standing flexion. Mild bilateral lateral recess narrowing at L5-S1. See results of CT. CT LUMBAR MYELOGRAM FINDINGS: T11-12: Negative T12-L1: Shallow central disc herniation with upward migration of disc material in the midline behind T12. This indents the thecal sac slightly but does not cause visible neural compression. L1-2: Mild bulging of the disc.  No significant stenosis. L2-3: Normal interspace. L3-4: Minimal disc bulge and facet hypertrophy. No compressive stenosis. L4-5: Chronic facet arthropathy with anterolisthesis of 3 mm. Bulging of the disc. Stenosis of both lateral recesses and proximal foramina that could cause neural compression on either or both sides. L5-S1: Small central to left paracentral disc protrusion. Facet degeneration and hypertrophy. Foraminal stenosis with some potential to affect either or both  exiting L5 nerves. IMPRESSION: LUMBAR MYELOGRAM IMPRESSION: Moderate multifactorial stenosis at L4-5. Grade  1 anterolisthesis which worsened slightly with standing flexion. CT LUMBAR MYELOGRAM IMPRESSION: L4-5: Bilateral facet arthropathy with anterolisthesis of 3 mm. Bulging of the disc and facet and ligamentous hypertrophy. Stenosis of both lateral recesses and neural foramina that could cause neural compression on either or both sides. L5-S1: Bilateral facet arthropathy. Mild bulging of the disc. Bilateral foraminal stenosis that could affect either or both L5 nerves. Electronically Signed   By: Bettylou Brunner M.D.   On: 07/10/2023 10:59    Antibiotics:  Anti-infectives (From admission, onward)    Start     Dose/Rate Route Frequency Ordered Stop   08/07/23 1245  ceFAZolin  (ANCEF ) IVPB 2g/100 mL premix        2 g 200 mL/hr over 30 Minutes Intravenous Every 8 hours 08/07/23 1231 08/07/23 2107   08/07/23 0630  ceFAZolin  (ANCEF ) IVPB 2g/100 mL premix        2 g 200 mL/hr over 30 Minutes Intravenous On call to O.R. 08/07/23 0615 08/07/23 0825       Discharge Exam: Blood pressure (!) 103/46, pulse 73, temperature (!) 97.5 F (36.4 C), temperature source Oral, resp. rate 18, height 5' 8.5" (1.74 m), weight 89.3 kg, SpO2 96%. Neurologic: Grossly normal Ambulating andd voiding well incision cdi   Discharge Medications:   Allergies as of 08/08/2023       Reactions   Nitrostat [nitroglycerin] Anaphylaxis   "Cardiologist suggested he shouldn't take this med because it could kill him with his heart condition. Lowers BP"   Ace Inhibitors Swelling   Mevacor  [lovastatin ] Nausea Only   Solarcaine [benzocaine] Dermatitis        Medication List     TAKE these medications    acetaminophen  500 MG tablet Commonly known as: TYLENOL  Take 1,000 mg by mouth every 6 (six) hours as needed for headache.   aspirin  EC 81 MG tablet Take 1 tablet (81 mg total) by mouth daily.   colchicine  0.6 MG  tablet TAKE 1 TABLET TWICE DAILY What changed:  when to take this reasons to take this   diphenhydrAMINE  25 MG tablet Commonly known as: BENADRYL  Take 50 mg by mouth at bedtime.   finasteride 5 MG tablet Commonly known as: PROSCAR Take 5 mg by mouth at bedtime.   FISH OIL PO Take 1,000 mg by mouth in the morning.   furosemide  40 MG tablet Commonly known as: LASIX  TAKE 1 TABLET TWICE DAILY What changed: when to take this   gabapentin  300 MG capsule Commonly known as: NEURONTIN  Take 1-2 capsules (300-600 mg total) by mouth 3 (three) times daily as needed (pain). What changed:  how much to take when to take this   hydrALAZINE  25 MG tablet Commonly known as: APRESOLINE  Take 25 mg by mouth in the morning and at bedtime.   MAGNESIUM PO Take 400 mg by mouth in the morning.   methocarbamol  500 MG tablet Commonly known as: ROBAXIN  Take 1 tablet (500 mg total) by mouth every 6 (six) hours as needed for muscle spasms.   multivitamin with minerals Tabs tablet Take 1 tablet by mouth in the morning. One-A-Day Men Health Multivitamin   naproxen  sodium 220 MG tablet Commonly known as: ALEVE  Take 440 mg by mouth daily as needed (knee pain).   oxyCODONE -acetaminophen  5-325 MG tablet Commonly known as: Percocet Take 1 tablet by mouth every 4 (four) hours as needed for severe pain (pain score 7-10).   QC TUMERIC COMPLEX PO Take 1 tablet by mouth in the morning.  rosuvastatin  5 MG tablet Commonly known as: CRESTOR  Take 1 tablet (5 mg total) by mouth every Monday, Wednesday, and Friday. What changed: additional instructions   tamsulosin  0.4 MG Caps capsule Commonly known as: FLOMAX  TAKE 1 CAPSULE EVERY DAY What changed: when to take this   VITAMIN B-12 PO Take 1,000 mcg by mouth in the morning.   zinc sulfate (50mg  elemental zinc) 220 (50 Zn) MG capsule Take 220 mg by mouth daily.               Durable Medical Equipment  (From admission, onward)            Start     Ordered   08/07/23 1232  DME Walker rolling  Once       Question:  Patient needs a walker to treat with the following condition  Answer:  S/P lumbar fusion   08/07/23 1231   08/07/23 1232  DME 3 n 1  Once        08/07/23 1231            Disposition: home   Final Dx: PLIF L4-5  Discharge Instructions      Remove dressing in 72 hours   Complete by: As directed    Call MD for:  difficulty breathing, headache or visual disturbances   Complete by: As directed    Call MD for:  hives   Complete by: As directed    Call MD for:  persistant dizziness or light-headedness   Complete by: As directed    Call MD for:  persistant nausea and vomiting   Complete by: As directed    Call MD for:  redness, tenderness, or signs of infection (pain, swelling, redness, odor or green/yellow discharge around incision site)   Complete by: As directed    Call MD for:  severe uncontrolled pain   Complete by: As directed    Call MD for:  temperature >100.4   Complete by: As directed    Diet - low sodium heart healthy   Complete by: As directed    Driving Restrictions   Complete by: As directed    No driving for 2 weeks, no riding in the car for 1 week   Increase activity slowly   Complete by: As directed    Lifting restrictions   Complete by: As directed    No lifting more than 8 lbs          Signed: Kenard Paul Masson Nalepa 08/08/2023, 11:58 AM

## 2023-08-08 NOTE — Plan of Care (Signed)
 Pt doing well. Pt given D/C instructions and Rx's with verbal understanding. Pt's incision is clean and dry with no sign of infection. Pt's IV was removed prior to D/C. Pt D/C'd home via wheelchair per MD order. Pt is stable @ D/C and has no other needs at this time. Barron Lien, RN

## 2023-08-08 NOTE — Anesthesia Postprocedure Evaluation (Signed)
 Anesthesia Post Note  Patient: [name] [name] [place]  Procedure(s) Performed: POSTERIOR LUMBAR FUSION LUMBAR FOUR-FIVE (Back)     Patient location during evaluation: PACU Anesthesia Type: General Level of consciousness: awake and alert Pain management: pain level controlled Vital Signs Assessment: post-procedure vital signs reviewed and stable Respiratory status: spontaneous breathing, nonlabored ventilation, respiratory function stable and patient connected to nasal cannula oxygen Cardiovascular status: blood pressure returned to baseline and stable Postop Assessment: no apparent nausea or vomiting Anesthetic complications: no   No notable events documented.  Last Vitals:  Vitals:   08/08/23 0000 08/08/23 0228  BP: 114/73 (!) 104/51  Pulse: 94 65  Resp:  20  Temp: 37.1 C 36.5 C  SpO2: 100% 99%    Last Pain:  Vitals:   08/08/23 0228  TempSrc: Oral  PainSc:                  Kennie Karapetian S

## 2023-08-08 NOTE — Evaluation (Signed)
 Physical Therapy Evaluation  Patient Details Name: [name] [name] MRN: [id number] DOB: 1946-03-01 Today's Date: 08/08/2023  History of Present Illness  Pt is a 78 y/o male who presents s/p decompressive lumbar laminectomy, hemi facetectomy and foraminotomies L4-5 and fusion. PHMx:OA, CHF, left retinal detachment, cervical and lumbar sx  Clinical Impression  Pt admitted with above diagnosis. At the time of PT eval, pt was able to demonstrate transfers and ambulation with gross supervision for safety and no AD. Pt asking to go to SNF level rehab at d/c. Discussed pros and cons of SNF vs returning home. Pt was educated on precautions, brace application/wearing schedule, appropriate activity progression, and car transfer. Pt currently with functional limitations due to the deficits listed below (see PT Problem List). Pt will benefit from skilled PT to increase their independence and safety with mobility to allow discharge to the venue listed below.          If plan is discharge home, recommend the following: A little help with walking and/or transfers;A little help with bathing/dressing/bathroom;Assistance with cooking/housework;Assist for transportation;Help with stairs or ramp for entrance   Can travel by private vehicle        Equipment Recommendations None recommended by PT  Recommendations for Other Services       Functional Status Assessment Patient has had a recent decline in their functional status and demonstrates the ability to make significant improvements in function in a reasonable and predictable amount of time.     Precautions / Restrictions Precautions Precautions: Back Precaution Booklet Issued: Yes (comment) Recall of Precautions/Restrictions: Intact Required Braces or Orthoses: Spinal Brace Spinal Brace: Lumbar corset;Applied in sitting position;Applied in standing position Restrictions Weight Bearing Restrictions Per Provider Order: No      Mobility  Bed  Mobility Overal bed mobility: Needs Assistance Bed Mobility: Rolling, Sidelying to Sit Rolling: Supervision Sidelying to sit: Supervision       General bed mobility comments: HOB flat and rails lowered to simulate home environment.    Transfers Overall transfer level: Needs assistance Equipment used: None Transfers: Sit to/from Stand Sit to Stand: Supervision           General transfer comment: Pt demonstrated good hand placement on seated surface for safety.    Ambulation/Gait Ambulation/Gait assistance: Supervision Gait Distance (Feet): 300 Feet Assistive device: None Gait Pattern/deviations: Step-through pattern, Decreased stride length, Trunk flexed, Drifts right/left Gait velocity: Decreased Gait velocity interpretation: 1.31 - 2.62 ft/sec, indicative of limited community ambulator   General Gait Details: VC's for improved posture throughout. Pt with occasional drifting R and L in hall but without overt LOB noted.  Stairs Stairs: Yes Stairs assistance: Contact guard assist Stair Management: One rail Left, Forwards, Alternating pattern Number of Stairs: 4 General stair comments: VC's for sequencing and general safety. Pt completed without difficulty.  Wheelchair Mobility     Tilt Bed    Modified Rankin (Stroke Patients Only)       Balance Overall balance assessment: Mild deficits observed, not formally tested                                           Pertinent Vitals/Pain Pain Assessment Pain Assessment: No/denies pain    Home Living Family/patient expects to be discharged to:: Private residence Living Arrangements: Alone (plans on staying with girlfriend during this first week and then home) Available Help at Discharge: Friend(s) Type  of Home: House           Home Equipment: Agricultural consultant (2 wheels);Rollator (4 wheels)      Prior Function Prior Level of Function : Independent/Modified Independent                      Extremity/Trunk Assessment   Upper Extremity Assessment Upper Extremity Assessment: Defer to OT evaluation    Lower Extremity Assessment Lower Extremity Assessment: Generalized weakness (Mild; consistent with pre-op diagnosis)    Cervical / Trunk Assessment Cervical / Trunk Assessment: Back Surgery  Communication   Communication Communication: Impaired Factors Affecting Communication: Hearing impaired    Cognition Arousal: Alert Behavior During Therapy: WFL for tasks assessed/performed                             Following commands: Intact       Cueing Cueing Techniques: Verbal cues     General Comments      Exercises     Assessment/Plan    PT Assessment Patient needs continued PT services  PT Problem List Decreased strength;Decreased activity tolerance;Decreased balance;Decreased mobility;Decreased knowledge of use of DME;Decreased safety awareness;Decreased knowledge of precautions;Pain       PT Treatment Interventions DME instruction;Gait training;Stair training;Functional mobility training;Therapeutic activities;Therapeutic exercise;Balance training;Patient/family education    PT Goals (Current goals can be found in the Care Plan section)  Acute Rehab PT Goals Patient Stated Goal: Be able to manage at home by himself PT Goal Formulation: With patient Time For Goal Achievement: 08/15/23 Potential to Achieve Goals: Good    Frequency Min 5X/week     Co-evaluation               AM-PAC PT "6 Clicks" Mobility  Outcome Measure Help needed turning from your back to your side while in a flat bed without using bedrails?: A Little Help needed moving from lying on your back to sitting on the side of a flat bed without using bedrails?: A Little Help needed moving to and from a bed to a chair (including a wheelchair)?: A Little Help needed standing up from a chair using your arms (e.g., wheelchair or bedside chair)?: A Little Help needed  to walk in hospital room?: A Little Help needed climbing 3-5 steps with a railing? : A Little 6 Click Score: 18    End of Session Equipment Utilized During Treatment: Gait belt;Back brace Activity Tolerance: Patient tolerated treatment well Patient left: in chair;with call bell/phone within reach Nurse Communication: Mobility status PT Visit Diagnosis: Unsteadiness on feet (R26.81);Pain Pain - part of body:  (back)    Time: 1610-9604 PT Time Calculation (min) (ACUTE ONLY): 26 min   Charges:   PT Evaluation $PT Eval Low Complexity: 1 Low PT Treatments $Gait Training: 8-22 mins PT General Charges $$ ACUTE PT VISIT: 1 Visit         Simone Dubois, PT, DPT Acute Rehabilitation Services Secure Chat Preferred Office: [phone]   Venus Ginsberg 08/08/2023, 10:50 AM

## 2023-08-08 NOTE — Evaluation (Signed)
 Occupational Therapy Evaluation and Discharge Patient Details Name: [name] [name] MRN: [id number] DOB: [birth year] Today's Date: 08/08/2023   History of Present Illness   Pt is a 78 y/o male who presents s/p decompressive lumbar laminectomy, hemi facetectomy and foraminotomies L4-5 and fusion. PHMx:OA, CHF, left retinal detachment, cervical and lumbar sx     Clinical Impressions This 78 yo male admitted and underwent above presents to acute with all education completed and post op back handout provided and reviewed. No further OT needs, we will sign off.     If plan is discharge home, recommend the following:   Assistance with cooking/housework;Assist for transportation     Functional Status Assessment   Patient has had a recent decline in their functional status and demonstrates the ability to make significant improvements in function in a reasonable and predictable amount of time. (without further need for skilled OT)     Equipment Recommendations   None recommended by OT      Precautions/Restrictions   Precautions Precautions: Back Precaution Booklet Issued: Yes (comment) Recall of Precautions/Restrictions: Intact Required Braces or Orthoses: Spinal Brace Spinal Brace: Lumbar corset;Applied in sitting position;Applied in standing position Restrictions Weight Bearing Restrictions Per Provider Order: No     Mobility Bed Mobility               General bed mobility comments: sitting up in recliner upon my arrival    Transfers Overall transfer level: Modified independent Equipment used: None                      Balance Overall balance assessment: Independent                                         ADL either performed or assessed with clinical judgement   ADL                                         General ADL Comments: Educated on use of 2 cups for brushing teeth, use of wet wipes for back peri  care, use of pillows for positioning in bed, building up sitting tolerance, sequence of dressing, in and OOB, sit<>stand stance to keep back more straight, side step into tub     Vision Baseline Vision/History:  (left retinal detachment) Patient Visual Report: No change from baseline              Pertinent Vitals/Pain Pain Assessment Pain Assessment: No/denies pain     Extremity/Trunk Assessment Upper Extremity Assessment Upper Extremity Assessment: Overall WFL for tasks assessed           Communication Communication Communication: No apparent difficulties   Cognition Arousal: Alert Behavior During Therapy: WFL for tasks assessed/performed Cognition: No apparent impairments                               Following commands: Intact       Cueing   Cueing Techniques: Verbal cues              Home Living Family/patient expects to be discharged to:: Private residence Living Arrangements: Alone (plans on staying with girlfriend during this first week and then home) Available Help at Discharge: Friend(s) Type of Home: House  Bathroom Shower/Tub: Chief Strategy Officer: Handicapped height                Prior Functioning/Environment Prior Level of Function : Independent/Modified Independent                    OT Problem List: Decreased range of motion        OT Goals(Current goals can be found in the care plan section)   Acute Rehab OT Goals Patient Stated Goal: to go home today         AM-PAC OT "6 Clicks" Daily Activity     Outcome Measure Help from another person eating meals?: None Help from another person taking care of personal grooming?: None Help from another person toileting, which includes using toliet, bedpan, or urinal?: None Help from another person bathing (including washing, rinsing, drying)?: None Help from another person to put on and taking off regular upper body clothing?:  None Help from another person to put on and taking off regular lower body clothing?: None 6 Click Score: 24   End of Session Equipment Utilized During Treatment: Back brace Nurse Communication:  (no further OT needs)  Activity Tolerance: Patient tolerated treatment well Patient left: in chair  OT Visit Diagnosis: Muscle weakness (generalized) (M62.81)                Time: 1610-9604 OT Time Calculation (min): 14 min Charges:  OT General Charges $OT Visit: 1 Visit OT Evaluation $OT Eval Moderate Complexity: 1 Mod  Cathy L. OT Acute Rehabilitation Services Office [phone]    Lenox Raider 08/08/2023, 10:41 AM

## 2023-08-08 NOTE — Progress Notes (Signed)
 Subjective: Patient reports doing well, some back pain  Objective: Vital signs in last 24 hours: Temp:  [96.7 F (35.9 C)-98.8 F (37.1 C)] 97.7 F (36.5 C) (05/13 0228) Pulse Rate:  [64-94] 65 (05/13 0228) Resp:  [11-22] 20 (05/13 0228) BP: (100-126)/(51-84) 104/51 (05/13 0228) SpO2:  [95 %-100 %] 99 % (05/13 0228) Arterial Line BP: (102-129)/(44-49) 103/44 (05/12 1200)  Intake/Output from previous day: 05/12 0701 - 05/13 0700 In: 2220 [P.O.:720; I.V.:1000; IV Piggyback:500] Out: 550 [Urine:150; Blood:400] Intake/Output this shift: No intake/output data recorded.  Neurologic: Grossly normal  Lab Results: Lab Results  Component Value Date   WBC 8.2 07/28/2023   HGB 13.4 07/28/2023   HCT 40.7 07/28/2023   MCV 90.0 07/28/2023   PLT 147 (L) 07/28/2023   Lab Results  Component Value Date   INR 1.0 07/28/2023   BMET Lab Results  Component Value Date   NA 138 07/12/2023   K 4.4 07/12/2023   CL 103 07/12/2023   CO2 28 07/12/2023   GLUCOSE 97 07/12/2023   BUN 19 07/12/2023   CREATININE 1.06 07/12/2023   CALCIUM  9.7 07/12/2023    Studies/Results: DG Lumbar Spine 2-3 Views Result Date: 08/07/2023 CLINICAL DATA:  L4-5 laminectomy intraoperative EXAM: LUMBAR SPINE - 2-3 VIEW COMPARISON:  05/24/2023 FINDINGS: Intraoperative images of the lumbar spine demonstrate bilateral posterolateral rod and pedicle screw fixation at L4-5 with interbody spacer. No complicating feature observed. IMPRESSION: 1. Intraoperative images of the lumbar spine demonstrate bilateral posterolateral rod and pedicle screw fixation at L4-5 with interbody spacer. Electronically Signed   By: Freida Jes M.D.   On: 08/07/2023 14:18   DG C-Arm 1-60 Min-No Report Result Date: 08/07/2023 Fluoroscopy was utilized by the requesting physician.  No radiographic interpretation.   DG C-Arm 1-60 Min-No Report Result Date: 08/07/2023 Fluoroscopy was utilized by the requesting physician.  No radiographic  interpretation.   DG C-Arm 1-60 Min-No Report Result Date: 08/07/2023 Fluoroscopy was utilized by the requesting physician.  No radiographic interpretation.    Assessment/Plan: Ambulating and voiding well, patient has no help at home so he would like to go to a SNF. Will let him work with therapy today   LOS: 0 days    [name] [name] 08/08/2023, 7:48 AM

## 2023-08-22 DIAGNOSIS — M4316 Spondylolisthesis, lumbar region: Secondary | ICD-10-CM | POA: Diagnosis not present

## 2023-08-25 ENCOUNTER — Other Ambulatory Visit: Payer: Self-pay | Admitting: Family Medicine

## 2023-08-25 ENCOUNTER — Other Ambulatory Visit: Payer: Self-pay | Admitting: Cardiology

## 2023-08-25 DIAGNOSIS — M109 Gout, unspecified: Secondary | ICD-10-CM

## 2023-08-25 MED ORDER — HYDRALAZINE HCL 25 MG PO TABS: 25.0000 mg | ORAL_TABLET | Freq: Two times a day (BID) | ORAL | 3 refills | Status: DC

## 2023-08-29 ENCOUNTER — Inpatient Hospital Stay (HOSPITAL_COMMUNITY): Admitting: Anesthesiology

## 2023-08-29 ENCOUNTER — Inpatient Hospital Stay (HOSPITAL_COMMUNITY)
Admission: AD | Admit: 2023-08-29 | Discharge: 2023-09-04 | DRG: 863 | Disposition: A | Discharge status: Home / Self Care | Source: Ambulatory Visit | Attending: Neurosurgery | Admitting: Neurosurgery

## 2023-08-29 ENCOUNTER — Encounter (HOSPITAL_COMMUNITY)
Admission: AD | Disposition: A | Discharge status: Home / Self Care | Payer: Self-pay | Source: Ambulatory Visit | Attending: Neurosurgery

## 2023-08-29 ENCOUNTER — Encounter (HOSPITAL_COMMUNITY): Payer: Self-pay

## 2023-08-29 ENCOUNTER — Other Ambulatory Visit: Payer: Self-pay | Admitting: Neurosurgery

## 2023-08-29 ENCOUNTER — Encounter (HOSPITAL_COMMUNITY): Payer: Self-pay | Admitting: Neurosurgery

## 2023-08-29 ENCOUNTER — Other Ambulatory Visit: Payer: Self-pay

## 2023-08-29 DIAGNOSIS — T8141XA Infection following a procedure, superficial incisional surgical site, initial encounter: Principal | ICD-10-CM | POA: Diagnosis present

## 2023-08-29 DIAGNOSIS — T8149XA Infection following a procedure, other surgical site, initial encounter: Secondary | ICD-10-CM

## 2023-08-29 DIAGNOSIS — D6489 Other specified anemias: Secondary | ICD-10-CM | POA: Diagnosis present

## 2023-08-29 DIAGNOSIS — Z85820 Personal history of malignant melanoma of skin: Secondary | ICD-10-CM

## 2023-08-29 DIAGNOSIS — Z87891 Personal history of nicotine dependence: Secondary | ICD-10-CM | POA: Diagnosis not present

## 2023-08-29 DIAGNOSIS — E781 Pure hyperglyceridemia: Secondary | ICD-10-CM | POA: Diagnosis not present

## 2023-08-29 DIAGNOSIS — I5022 Chronic systolic (congestive) heart failure: Secondary | ICD-10-CM | POA: Diagnosis not present

## 2023-08-29 DIAGNOSIS — I110 Hypertensive heart disease with heart failure: Secondary | ICD-10-CM

## 2023-08-29 DIAGNOSIS — I509 Heart failure, unspecified: Secondary | ICD-10-CM | POA: Diagnosis present

## 2023-08-29 DIAGNOSIS — Y838 Other surgical procedures as the cause of abnormal reaction of the patient, or of later complication, without mention of misadventure at the time of the procedure: Secondary | ICD-10-CM | POA: Diagnosis present

## 2023-08-29 DIAGNOSIS — Z7982 Long term (current) use of aspirin: Secondary | ICD-10-CM | POA: Diagnosis not present

## 2023-08-29 DIAGNOSIS — I5032 Chronic diastolic (congestive) heart failure: Secondary | ICD-10-CM | POA: Diagnosis not present

## 2023-08-29 DIAGNOSIS — N400 Enlarged prostate without lower urinary tract symptoms: Secondary | ICD-10-CM | POA: Diagnosis not present

## 2023-08-29 DIAGNOSIS — Z95810 Presence of automatic (implantable) cardiac defibrillator: Secondary | ICD-10-CM

## 2023-08-29 DIAGNOSIS — T8131XA Disruption of external operation (surgical) wound, not elsewhere classified, initial encounter: Principal | ICD-10-CM | POA: Diagnosis present

## 2023-08-29 DIAGNOSIS — I428 Other cardiomyopathies: Secondary | ICD-10-CM | POA: Diagnosis present

## 2023-08-29 DIAGNOSIS — B957 Other staphylococcus as the cause of diseases classified elsewhere: Secondary | ICD-10-CM | POA: Diagnosis present

## 2023-08-29 DIAGNOSIS — T8142XA Infection following a procedure, deep incisional surgical site, initial encounter: Secondary | ICD-10-CM | POA: Diagnosis not present

## 2023-08-29 DIAGNOSIS — Z881 Allergy status to other antibiotic agents status: Secondary | ICD-10-CM

## 2023-08-29 DIAGNOSIS — T8140XA Infection following a procedure, unspecified, initial encounter: Secondary | ICD-10-CM | POA: Diagnosis not present

## 2023-08-29 DIAGNOSIS — Z79899 Other long term (current) drug therapy: Secondary | ICD-10-CM | POA: Diagnosis not present

## 2023-08-29 DIAGNOSIS — B958 Unspecified staphylococcus as the cause of diseases classified elsewhere: Secondary | ICD-10-CM | POA: Diagnosis not present

## 2023-08-29 HISTORY — PX: LUMBAR WOUND DEBRIDEMENT: SHX1988

## 2023-08-29 LAB — POCT I-STAT, CHEM 8
BUN: 24 mg/dL — ABNORMAL HIGH (ref 8–23)
Calcium, Ion: 1.2 mmol/L (ref 1.15–1.40)
Chloride: 102 mmol/L (ref 98–111)
Creatinine, Ser: 1.2 mg/dL (ref 0.61–1.24)
Glucose, Bld: 101 mg/dL — ABNORMAL HIGH (ref 70–99)
HCT: 34 % — ABNORMAL LOW (ref 39.0–52.0)
Hemoglobin: 11.6 g/dL — ABNORMAL LOW (ref 13.0–17.0)
Potassium: 4.4 mmol/L (ref 3.5–5.1)
Sodium: 137 mmol/L (ref 135–145)
TCO2: 24 mmol/L (ref 22–32)

## 2023-08-29 LAB — TYPE AND SCREEN
ABO/RH(D): A POS
Antibody Screen: NEGATIVE

## 2023-08-29 SURGERY — LUMBAR WOUND DEBRIDEMENT
Anesthesia: General | Site: Spine Lumbar

## 2023-08-29 MED ORDER — LINEZOLID 600 MG/300ML IV SOLN
600.0000 mg | Freq: Two times a day (BID) | INTRAVENOUS | Status: DC
  Administered 2023-08-29 – 2023-09-04 (×12): 600 mg via INTRAVENOUS
  Filled 2023-08-29 (×13): qty 300

## 2023-08-29 MED ORDER — FINASTERIDE 5 MG PO TABS
5.0000 mg | ORAL_TABLET | Freq: Every day | ORAL | Status: DC
  Administered 2023-08-29 – 2023-09-03 (×6): 5 mg via ORAL
  Filled 2023-08-29 (×6): qty 1

## 2023-08-29 MED ORDER — DOCUSATE SODIUM 100 MG PO CAPS
100.0000 mg | ORAL_CAPSULE | Freq: Two times a day (BID) | ORAL | Status: DC
  Administered 2023-08-29 – 2023-09-04 (×12): 100 mg via ORAL
  Filled 2023-08-29 (×12): qty 1

## 2023-08-29 MED ORDER — CEFAZOLIN SODIUM-DEXTROSE 2-4 GM/100ML-% IV SOLN: 2.0000 g | Freq: Three times a day (TID) | INTRAVENOUS | Status: DC

## 2023-08-29 MED ORDER — OXYCODONE HCL 5 MG/5ML PO SOLN: 5.0000 mg | Freq: Once | ORAL | Status: DC | PRN

## 2023-08-29 MED ORDER — MENTHOL 3 MG MT LOZG: 1.0000 | LOZENGE | OROMUCOSAL | Status: DC | PRN

## 2023-08-29 MED ORDER — FENTANYL CITRATE (PF) 250 MCG/5ML IJ SOLN
INTRAMUSCULAR | Status: DC | PRN
  Administered 2023-08-29 (×3): 50 ug via INTRAVENOUS

## 2023-08-29 MED ORDER — ADULT MULTIVITAMIN W/MINERALS CH
1.0000 | ORAL_TABLET | Freq: Every day | ORAL | Status: DC
  Administered 2023-08-30 – 2023-09-04 (×6): 1 via ORAL
  Filled 2023-08-29 (×6): qty 1

## 2023-08-29 MED ORDER — GABAPENTIN 300 MG PO CAPS
300.0000 mg | ORAL_CAPSULE | Freq: Three times a day (TID) | ORAL | Status: DC
  Administered 2023-08-29 – 2023-09-04 (×16): 300 mg via ORAL
  Filled 2023-08-29 (×17): qty 1

## 2023-08-29 MED ORDER — LIDOCAINE 2% (20 MG/ML) 5 ML SYRINGE
INTRAMUSCULAR | Status: AC
  Filled 2023-08-29: qty 5

## 2023-08-29 MED ORDER — HYDROMORPHONE HCL 1 MG/ML IJ SOLN
0.2500 mg | INTRAMUSCULAR | Status: DC | PRN
  Administered 2023-08-29: 0.25 mg via INTRAVENOUS

## 2023-08-29 MED ORDER — ONDANSETRON HCL 4 MG PO TABS: 4.0000 mg | ORAL_TABLET | Freq: Four times a day (QID) | ORAL | Status: DC | PRN

## 2023-08-29 MED ORDER — ONDANSETRON HCL 4 MG/2ML IJ SOLN
4.0000 mg | Freq: Four times a day (QID) | INTRAMUSCULAR | Status: DC | PRN
  Administered 2023-08-31: 4 mg via INTRAVENOUS
  Filled 2023-08-29: qty 2

## 2023-08-29 MED ORDER — PANTOPRAZOLE SODIUM 40 MG IV SOLR: 40.0000 mg | Freq: Every day | INTRAVENOUS | Status: DC

## 2023-08-29 MED ORDER — ROCURONIUM BROMIDE 10 MG/ML (PF) SYRINGE
PREFILLED_SYRINGE | INTRAVENOUS | Status: DC | PRN
  Administered 2023-08-29: 50 mg via INTRAVENOUS

## 2023-08-29 MED ORDER — ACETAMINOPHEN 10 MG/ML IV SOLN
INTRAVENOUS | Status: AC
  Filled 2023-08-29: qty 100

## 2023-08-29 MED ORDER — ACETAMINOPHEN 650 MG RE SUPP
650.0000 mg | RECTAL | Status: DC | PRN
Start: 2023-08-29 — End: 2023-09-04

## 2023-08-29 MED ORDER — DEXAMETHASONE SODIUM PHOSPHATE 10 MG/ML IJ SOLN
INTRAMUSCULAR | Status: DC | PRN
  Administered 2023-08-29: 10 mg via INTRAVENOUS

## 2023-08-29 MED ORDER — ACETAMINOPHEN 500 MG PO TABS: 1000.0000 mg | ORAL_TABLET | Freq: Once | ORAL | Status: DC

## 2023-08-29 MED ORDER — MIDAZOLAM HCL 2 MG/2ML IJ SOLN: 0.5000 mg | Freq: Once | INTRAMUSCULAR | Status: DC | PRN

## 2023-08-29 MED ORDER — SODIUM CHLORIDE 0.9% FLUSH
3.0000 mL | Freq: Two times a day (BID) | INTRAVENOUS | Status: DC
  Administered 2023-08-29 – 2023-09-04 (×11): 3 mL via INTRAVENOUS

## 2023-08-29 MED ORDER — OXYCODONE HCL 5 MG PO TABS: 5.0000 mg | ORAL_TABLET | Freq: Once | ORAL | Status: DC | PRN

## 2023-08-29 MED ORDER — FENTANYL CITRATE (PF) 250 MCG/5ML IJ SOLN
INTRAMUSCULAR | Status: AC
  Filled 2023-08-29: qty 5

## 2023-08-29 MED ORDER — ORAL CARE MOUTH RINSE: 15.0000 mL | Freq: Once | OROMUCOSAL | Status: AC

## 2023-08-29 MED ORDER — HYDROMORPHONE HCL 1 MG/ML IJ SOLN
INTRAMUSCULAR | Status: AC
  Filled 2023-08-29: qty 1

## 2023-08-29 MED ORDER — BISACODYL 10 MG RE SUPP
10.0000 mg | Freq: Every day | RECTAL | Status: DC | PRN
Start: 2023-08-29 — End: 2023-09-04

## 2023-08-29 MED ORDER — VANCOMYCIN HCL 1750 MG/350ML IV SOLN
1750.0000 mg | Freq: Once | INTRAVENOUS | Status: AC
  Administered 2023-08-29: 1750 mg via INTRAVENOUS
  Filled 2023-08-29: qty 350

## 2023-08-29 MED ORDER — CEFAZOLIN SODIUM-DEXTROSE 2-4 GM/100ML-% IV SOLN
INTRAVENOUS | Status: AC
  Filled 2023-08-29: qty 100

## 2023-08-29 MED ORDER — MEPERIDINE HCL 25 MG/ML IJ SOLN: 6.2500 mg | INTRAMUSCULAR | Status: DC | PRN

## 2023-08-29 MED ORDER — LACTATED RINGERS IV SOLN: INTRAVENOUS | Status: DC

## 2023-08-29 MED ORDER — SENNA 8.6 MG PO TABS
1.0000 | ORAL_TABLET | Freq: Two times a day (BID) | ORAL | Status: DC
  Administered 2023-08-29 – 2023-09-04 (×12): 8.6 mg via ORAL
  Filled 2023-08-29 (×12): qty 1

## 2023-08-29 MED ORDER — LIDOCAINE 2% (20 MG/ML) 5 ML SYRINGE
INTRAMUSCULAR | Status: DC | PRN
  Administered 2023-08-29: 50 mg via INTRAVENOUS

## 2023-08-29 MED ORDER — LACTATED RINGERS IV SOLN
INTRAVENOUS | Status: DC | PRN
Start: 2023-08-29 — End: 2023-08-29

## 2023-08-29 MED ORDER — SODIUM CHLORIDE 0.9 % IV SOLN: 250.0000 mL | INTRAVENOUS | Status: AC

## 2023-08-29 MED ORDER — CHLORHEXIDINE GLUCONATE CLOTH 2 % EX PADS: 6.0000 | MEDICATED_PAD | Freq: Once | CUTANEOUS | Status: DC

## 2023-08-29 MED ORDER — CHLORHEXIDINE GLUCONATE 0.12 % MT SOLN: 15.0000 mL | Freq: Once | OROMUCOSAL | Status: AC

## 2023-08-29 MED ORDER — SODIUM CHLORIDE 0.9 % IV SOLN
2.0000 g | Freq: Three times a day (TID) | INTRAVENOUS | Status: DC
  Filled 2023-08-29 (×3): qty 12.5

## 2023-08-29 MED ORDER — ROSUVASTATIN CALCIUM 5 MG PO TABS
5.0000 mg | ORAL_TABLET | ORAL | Status: DC
  Administered 2023-08-30 – 2023-09-04 (×3): 5 mg via ORAL
  Filled 2023-08-29 (×3): qty 1

## 2023-08-29 MED ORDER — SUGAMMADEX SODIUM 200 MG/2ML IV SOLN
INTRAVENOUS | Status: DC | PRN
  Administered 2023-08-29: 200 mg via INTRAVENOUS

## 2023-08-29 MED ORDER — PHENOL 1.4 % MT LIQD
1.0000 | OROMUCOSAL | Status: DC | PRN
Start: 2023-08-29 — End: 2023-09-04

## 2023-08-29 MED ORDER — ACETAMINOPHEN 325 MG PO TABS
650.0000 mg | ORAL_TABLET | ORAL | Status: DC | PRN
  Administered 2023-08-31 – 2023-09-02 (×3): 650 mg via ORAL
  Filled 2023-08-29 (×3): qty 2

## 2023-08-29 MED ORDER — OXYCODONE HCL 5 MG PO TABS
10.0000 mg | ORAL_TABLET | ORAL | Status: DC | PRN
  Administered 2023-08-29 (×2): 10 mg via ORAL
  Filled 2023-08-29 (×3): qty 2

## 2023-08-29 MED ORDER — COLCHICINE 0.6 MG PO TABS
0.6000 mg | ORAL_TABLET | Freq: Two times a day (BID) | ORAL | Status: DC
  Administered 2023-08-29 – 2023-09-04 (×12): 0.6 mg via ORAL
  Filled 2023-08-29 (×13): qty 1

## 2023-08-29 MED ORDER — CEFAZOLIN SODIUM-DEXTROSE 2-4 GM/100ML-% IV SOLN
2.0000 g | INTRAVENOUS | Status: AC
  Administered 2023-08-29: 2 g via INTRAVENOUS
  Filled 2023-08-29: qty 100

## 2023-08-29 MED ORDER — CHLORHEXIDINE GLUCONATE 0.12 % MT SOLN
OROMUCOSAL | Status: AC
  Administered 2023-08-29: 15 mL via OROMUCOSAL
  Filled 2023-08-29: qty 15

## 2023-08-29 MED ORDER — PHENYLEPHRINE 80 MCG/ML (10ML) SYRINGE FOR IV PUSH (FOR BLOOD PRESSURE SUPPORT)
PREFILLED_SYRINGE | INTRAVENOUS | Status: AC
Start: 2023-08-29 — End: ?
  Filled 2023-08-29: qty 10

## 2023-08-29 MED ORDER — BACITRACIN ZINC 500 UNIT/GM EX OINT
TOPICAL_OINTMENT | CUTANEOUS | Status: AC
  Filled 2023-08-29: qty 28.35

## 2023-08-29 MED ORDER — DIPHENHYDRAMINE HCL 25 MG PO CAPS
25.0000 mg | ORAL_CAPSULE | Freq: Four times a day (QID) | ORAL | Status: DC | PRN
  Administered 2023-08-29: 25 mg via ORAL
  Filled 2023-08-29: qty 1

## 2023-08-29 MED ORDER — VANCOMYCIN HCL 1.25 G IV SOLR: 1250.0000 mg | INTRAVENOUS | Status: DC

## 2023-08-29 MED ORDER — METHOCARBAMOL 500 MG PO TABS
500.0000 mg | ORAL_TABLET | Freq: Four times a day (QID) | ORAL | Status: DC | PRN
  Administered 2023-08-29 – 2023-09-01 (×7): 500 mg via ORAL
  Filled 2023-08-29 (×7): qty 1

## 2023-08-29 MED ORDER — 0.9 % SODIUM CHLORIDE (POUR BTL) OPTIME
TOPICAL | Status: DC | PRN
  Administered 2023-08-29: 1000 mL

## 2023-08-29 MED ORDER — HYDROMORPHONE HCL 1 MG/ML IJ SOLN
0.2500 mg | INTRAMUSCULAR | Status: DC | PRN
  Administered 2023-08-29 (×4): 0.5 mg via INTRAVENOUS

## 2023-08-29 MED ORDER — MORPHINE SULFATE (PF) 2 MG/ML IV SOLN
2.0000 mg | INTRAVENOUS | Status: DC | PRN
  Administered 2023-08-31 – 2023-09-01 (×5): 2 mg via INTRAVENOUS
  Filled 2023-08-29 (×5): qty 1

## 2023-08-29 MED ORDER — ETOMIDATE 2 MG/ML IV SOLN
INTRAVENOUS | Status: DC | PRN
  Administered 2023-08-29: 16 mg via INTRAVENOUS

## 2023-08-29 MED ORDER — HYDRALAZINE HCL 25 MG PO TABS
25.0000 mg | ORAL_TABLET | Freq: Two times a day (BID) | ORAL | Status: DC
  Administered 2023-08-29 – 2023-09-04 (×12): 25 mg via ORAL
  Filled 2023-08-29 (×12): qty 1

## 2023-08-29 MED ORDER — HYDROMORPHONE HCL 1 MG/ML IJ SOLN
INTRAMUSCULAR | Status: AC
Start: 2023-08-29 — End: ?
  Filled 2023-08-29: qty 1

## 2023-08-29 MED ORDER — PANTOPRAZOLE SODIUM 40 MG PO TBEC
40.0000 mg | DELAYED_RELEASE_TABLET | Freq: Every day | ORAL | Status: DC
  Administered 2023-08-29 – 2023-09-03 (×6): 40 mg via ORAL
  Filled 2023-08-29 (×6): qty 1

## 2023-08-29 MED ORDER — ONDANSETRON HCL 4 MG/2ML IJ SOLN
INTRAMUSCULAR | Status: DC | PRN
  Administered 2023-08-29: 4 mg via INTRAVENOUS

## 2023-08-29 MED ORDER — PROPOFOL 10 MG/ML IV BOLUS
INTRAVENOUS | Status: DC | PRN
  Administered 2023-08-29: 80 mg via INTRAVENOUS

## 2023-08-29 MED ORDER — SODIUM CHLORIDE 0.9% FLUSH: 3.0000 mL | INTRAVENOUS | Status: DC | PRN

## 2023-08-29 MED ORDER — PROPOFOL 10 MG/ML IV BOLUS
INTRAVENOUS | Status: AC
  Filled 2023-08-29: qty 20

## 2023-08-29 MED ORDER — FUROSEMIDE 40 MG PO TABS
40.0000 mg | ORAL_TABLET | ORAL | Status: DC
  Administered 2023-08-31 – 2023-09-04 (×2): 40 mg via ORAL
  Filled 2023-08-29 (×2): qty 1

## 2023-08-29 MED ORDER — TAMSULOSIN HCL 0.4 MG PO CAPS
0.4000 mg | ORAL_CAPSULE | Freq: Every day | ORAL | Status: DC
  Administered 2023-08-29 – 2023-09-03 (×6): 0.4 mg via ORAL
  Filled 2023-08-29 (×6): qty 1

## 2023-08-29 MED ORDER — OXYCODONE HCL 5 MG PO TABS
5.0000 mg | ORAL_TABLET | ORAL | Status: DC | PRN
  Administered 2023-08-30 (×4): 5 mg via ORAL
  Filled 2023-08-29 (×3): qty 1

## 2023-08-29 MED ORDER — PHENYLEPHRINE 80 MCG/ML (10ML) SYRINGE FOR IV PUSH (FOR BLOOD PRESSURE SUPPORT)
PREFILLED_SYRINGE | INTRAVENOUS | Status: DC | PRN
  Administered 2023-08-29: 80 ug via INTRAVENOUS

## 2023-08-29 MED ORDER — ACETAMINOPHEN 10 MG/ML IV SOLN
1000.0000 mg | Freq: Once | INTRAVENOUS | Status: DC | PRN
Start: 2023-08-29 — End: 2023-08-29
  Administered 2023-08-29: 1000 mg via INTRAVENOUS

## 2023-08-29 MED ORDER — ROCURONIUM BROMIDE 10 MG/ML (PF) SYRINGE
PREFILLED_SYRINGE | INTRAVENOUS | Status: AC
  Filled 2023-08-29: qty 10

## 2023-08-29 SURGICAL SUPPLY — 32 items
BAG COUNTER SPONGE SURGICOUNT (BAG) ×1 IMPLANT
CANISTER SUCTION 3000ML PPV (SUCTIONS) ×1 IMPLANT
DRAPE LAPAROTOMY 100X72X124 (DRAPES) ×1 IMPLANT
DRESSING PEEL AND PLC PRVNA 13 (GAUZE/BANDAGES/DRESSINGS) IMPLANT
ELECTRODE REM PT RTRN 9FT ADLT (ELECTROSURGICAL) ×1 IMPLANT
GAUZE 4X4 16PLY ~~LOC~~+RFID DBL (SPONGE) IMPLANT
GAUZE SPONGE 4X4 12PLY STRL (GAUZE/BANDAGES/DRESSINGS) IMPLANT
GLOVE BIOGEL PI IND STRL 7.5 (GLOVE) ×1 IMPLANT
GLOVE ECLIPSE 7.0 STRL STRAW (GLOVE) ×1 IMPLANT
GLOVE EXAM NITRILE XL STR (GLOVE) IMPLANT
GOWN STRL REUS W/ TWL LRG LVL3 (GOWN DISPOSABLE) ×1 IMPLANT
GOWN STRL REUS W/ TWL XL LVL3 (GOWN DISPOSABLE) IMPLANT
GOWN STRL REUS W/TWL 2XL LVL3 (GOWN DISPOSABLE) IMPLANT
KIT BASIN OR (CUSTOM PROCEDURE TRAY) ×1 IMPLANT
KIT TURNOVER KIT B (KITS) ×1 IMPLANT
NDL HYPO 22X1.5 SAFETY MO (MISCELLANEOUS) ×1 IMPLANT
NEEDLE HYPO 22X1.5 SAFETY MO (MISCELLANEOUS) ×1 IMPLANT
NS IRRIG 1000ML POUR BTL (IV SOLUTION) ×1 IMPLANT
PACK LAMINECTOMY NEURO (CUSTOM PROCEDURE TRAY) ×1 IMPLANT
PAD ARMBOARD POSITIONER FOAM (MISCELLANEOUS) ×3 IMPLANT
PATTIES SURGICAL .5 X.5 (GAUZE/BANDAGES/DRESSINGS) ×1 IMPLANT
PATTIES SURGICAL 1X1 (DISPOSABLE) ×1 IMPLANT
SPONGE SURGIFOAM ABS GEL SZ50 (HEMOSTASIS) IMPLANT
STAPLER SKIN PROX 35W (STAPLE) IMPLANT
SUT VIC AB 0 CT1 18XCR BRD8 (SUTURE) ×1 IMPLANT
SUT VICRYL 3-0 RB1 18 ABS (SUTURE) ×2 IMPLANT
SWAB COLLECTION DEVICE MRSA (MISCELLANEOUS) IMPLANT
SWAB CULTURE ESWAB REG 1ML (MISCELLANEOUS) IMPLANT
SYR 30ML SLIP (SYRINGE) ×1 IMPLANT
TOWEL GREEN STERILE (TOWEL DISPOSABLE) ×1 IMPLANT
TOWEL GREEN STERILE FF (TOWEL DISPOSABLE) ×1 IMPLANT
WATER STERILE IRR 1000ML POUR (IV SOLUTION) ×1 IMPLANT

## 2023-08-29 NOTE — Progress Notes (Signed)
 While IVABT/ Vancomycin was running, this writer was called in the room and pt c/o itching. Hives was noted in his scalp, upper back and chest area. No c/o SOB/DOB. Writer stopped the infusion immediately. According to the patient, the itching went away after the IVABT/Vanconmycin was stopped. Writer called the pharmacy and stated they ill reach out with MD.   Will continue to monitor patient. Wife is a bedside.

## 2023-08-29 NOTE — Progress Notes (Signed)
 Pt has ICD; no orders needed per DR. Cleora Daft

## 2023-08-29 NOTE — Anesthesia Postprocedure Evaluation (Signed)
 Anesthesia Post Note  Patient: [name] [name]  Procedure(s) Performed: LUMBAR WOUND IRRIGATION AND DEBRIDEMENT (Spine Lumbar)     Patient location during evaluation: PACU Anesthesia Type: General Level of consciousness: awake and alert, patient cooperative and oriented Pain management: pain level controlled Vital Signs Assessment: post-procedure vital signs reviewed and stable Respiratory status: spontaneous breathing, nonlabored ventilation and respiratory function stable Cardiovascular status: blood pressure returned to baseline and stable Postop Assessment: no apparent nausea or vomiting Anesthetic complications: no   No notable events documented.  Last Vitals:  Vitals:   08/29/23 1645 08/29/23 1700  BP: (!) 123/58 126/60  Pulse: 78 71  Resp: 14 (!) 9  Temp:    SpO2: 99% 100%    Last Pain:  Vitals:   08/29/23 1708  TempSrc:   PainSc: 5                  Deshon Koslowski,E. Sidonia Nutter

## 2023-08-29 NOTE — Anesthesia Procedure Notes (Addendum)
 Procedure Name: Intubation Date/Time: 08/29/2023 3:13 PM  Performed by: Johann Muta, CRNAPre-anesthesia Checklist: Patient identified, Emergency Drugs available, Suction available, Patient being monitored and Timeout performed Patient Re-evaluated:Patient Re-evaluated prior to induction Oxygen Delivery Method: Circle system utilized Preoxygenation: Pre-oxygenation with 100% oxygen Induction Type: IV induction Ventilation: Mask ventilation without difficulty Laryngoscope Size: Glidescope and 4 Grade View: Grade I Tube type: Oral Tube size: 7.5 mm Number of attempts: 1 Airway Equipment and Method: Stylet and Video-laryngoscopy Placement Confirmation: ETT inserted through vocal cords under direct vision, positive ETCO2, CO2 detector and breath sounds checked- equal and bilateral Secured at: 22 cm Tube secured with: Tape Dental Injury: Teeth and Oropharynx as per pre-operative assessment

## 2023-08-29 NOTE — Transfer of Care (Signed)
 Immediate Anesthesia Transfer of Care Note  Patient: [name] [name]  Procedure(s) Performed: LUMBAR WOUND IRRIGATION AND DEBRIDEMENT (Spine Lumbar)  Patient Location: PACU  Anesthesia Type:General  Level of Consciousness: awake and alert   Airway & Oxygen Therapy: Patient Spontanous Breathing and Patient connected to face mask oxygen  Post-op Assessment: Report given to RN and Post -op Vital signs reviewed and stable  Post vital signs: Reviewed and stable  Last Vitals:  Vitals Value Taken Time  BP 128/66 08/29/23 1620  Temp 36.4 C 08/29/23 1620  Pulse 87 08/29/23 1625  Resp 14 08/29/23 1625  SpO2 98 % 08/29/23 1625  Vitals shown include unfiled device data.  Last Pain:  Vitals:   08/29/23 1337  TempSrc: Oral  PainSc: 6          Complications: No notable events documented.

## 2023-08-29 NOTE — Op Note (Signed)
  NEUROSURGERY OPERATIVE NOTE   PREOP DIAGNOSIS:  Lumbar postoperative wound infection   POSTOP DIAGNOSIS: Same  PROCEDURE: Wound exploration, washout and closure  SURGEON: Dr. Augusto Blonder, MD  ASSISTANT: None  ANESTHESIA: General Endotracheal  EBL: 50cc  SPECIMENS: Subcutaneous swab for aerobic/anaerobic culture  DRAINS: Prevena closed negative pressure dressing  COMPLICATIONS: None immediate  CONDITION: Hemodynamically stable to PACU  HISTORY: [name] [name] is a 78 y.o. male who underwent L4-5 decompression and fusion about 3 weeks ago.  Patient presented back to the office for postoperative visit earlier today complaining of fevers, chills, and drainage from his wound for the last 3 or 4 days.  He was noted to have active thin yellowish fluid draining from the superior portion of his wound.  We therefore elected to proceed with wound exploration and washout.  Risks, benefits, and alternatives to surgery were all reviewed in detail with the patient.  After all his questions were answered informed consent was obtained and witnessed.  PROCEDURE IN DETAIL: The patient was brought to the operating room. After induction of general anesthesia, the patient was positioned on the operative table in the prone position. All pressure points were meticulously padded.  The ReVia's skin incision was then marked out and prepped and draped in the usual sterile fashion.  After timeout was conducted, the superior portion of the incision was opened with curved Mayo scissors.  There was some thin yellow fluid encountered.  I therefore placed the aerobic and anaerobic culture swabs into this subcutaneous pocket.  I then opened the remainder of the wound sharply with the knife.  There was a small subcutaneous pocket underneath the superior aspect of the wound which was easily drained of again more thin yellow fluid.  I then used my finger to palpate the lumbodorsal fascia.  There was no  obvious fascial dehiscence.  As of the subcutaneous pocket appeared to be granulating well, with a minimal amount of fibrinous exudate which was removed with the Bovie.  The wound was then irrigated with normal saline.  Hemostasis was easily achieved with the monopolar.  The subcutaneous wound was then closed in multiple layers using interrupted 0 and 3-0 Vicryl stitches.  Skin was closed with staples.  Bacitracin ointment and a close negative pressure dressing was applied.  At the end of the case all sponge, needle, instrument, and cottonoid counts were correct.  Augusto Blonder, MD [facility]

## 2023-08-29 NOTE — Anesthesia Preprocedure Evaluation (Addendum)
 Anesthesia Evaluation  Patient identified by MRN, date of birth, ID band Patient awake    Reviewed: Allergy & Precautions, NPO status , Patient's Chart, lab work & pertinent test results  History of Anesthesia Complications Negative for: history of anesthetic complications  Airway Mallampati: III  TM Distance: >3 FB Neck ROM: Full    Dental  (+) Dental Advisory Given   Pulmonary Smoking history: h/o cervical fusion., former smoker   breath sounds clear to auscultation       Cardiovascular hypertension, Pt. on medications (-) angina +CHF  (-) CAD + dysrhythmias Atrial Fibrillation + pacemaker + Cardiac Defibrillator (device has not shocked patient)  Rhythm:Regular Rate:Normal  06/2023 ECHO: EF 20-25%, severely decreased LVF, mod-severely dilated LV, mild LVH, Grade 1 DD, low normal RVF, mild MR   Neuro/Psych Back pain    GI/Hepatic negative GI ROS, Neg liver ROS,,,  Endo/Other  negative endocrine ROS    Renal/GU negative Renal ROS     Musculoskeletal  (+) Arthritis ,    Abdominal   Peds  Hematology Hb 11.6   Anesthesia Other Findings H/o prostate cancer H/o melanoma  Reproductive/Obstetrics                             Anesthesia Physical Anesthesia Plan  ASA: 4  Anesthesia Plan: General   Post-op Pain Management: Tylenol  PO (pre-op)*   Induction: Intravenous  PONV Risk Score and Plan: 1 and Ondansetron  and Dexamethasone   Airway Management Planned: Oral ETT and Video Laryngoscope Planned  Additional Equipment: None and ClearSight  Intra-op Plan:   Post-operative Plan: Extubation in OR  Informed Consent: I have reviewed the patients History and Physical, chart, labs and discussed the procedure including the risks, benefits and alternatives for the proposed anesthesia with the patient or authorized representative who has indicated his/her understanding and acceptance.      Dental advisory given  Plan Discussed with: CRNA and Surgeon  Anesthesia Plan Comments: (Magnet available for device management if needed)        Anesthesia Quick Evaluation

## 2023-08-29 NOTE — Progress Notes (Signed)
 Pharmacy Antibiotic Note  [name] [name] is a 78 y.o. male who underwent L4-5 decompression and fusion 3 weeks ago.  Patient presented back to office for postoperative visit with fever and drainage from his wound for the last 3 or 4 days. Per the op note, yellowish fluid was encountered from the topical portion of his wound. Patient is now s/p exploration and washout. Pharmacy has been consulted for vancomycin and cefepime dosing.  Plan: Vancomycin 1750 mg IV x1, followed by vancomycin 1250 mg IV Q24h (eAUC 446, goal AUC 400-550, Scr 1.2) Cefepime 2g IV Q8h Trend WBC, fever, renal function F/u cultures, clinical progress, levels as indicated De-escalate when able  Addendum: Per conversation with RN, shortly after starting vancomycin infusion patient developed itching and a rash on his torso. Vancomycin infusion stopped. Allergy profile updated. S/w NRSG, ok to change to linezolid.   Height: 5\' 8"  (172.7 cm) Weight: 87.5 kg (193 lb) IBW/kg (Calculated) : 68.4  Temp (24hrs), Avg:97.9 F (36.6 C), Min:97.6 F (36.4 C), Max:98.2 F (36.8 C)  Recent Labs  Lab 08/29/23 1357  CREATININE 1.20    Estimated Creatinine Clearance: 53.7 mL/min (by C-G formula based on SCr of 1.2 mg/dL).    Allergies  Allergen Reactions   Nitrostat [Nitroglycerin] Anaphylaxis    "Cardiologist suggested he shouldn't take this med because it could kill him with his heart condition. Lowers BP"   Ace Inhibitors Swelling   Mevacor  [Lovastatin ] Nausea Only   Solarcaine [Benzocaine] Dermatitis   Thank you for allowing pharmacy to be a part of this patient's care.  Claudia Cuff, PharmD, BCPS Clinical Pharmacist

## 2023-08-29 NOTE — H&P (Signed)
 Chief Complaint   Wound drainage  History of Present Illness  Patient is a 78 y.o. male who underwent decompression/fusion L4-5 about 3 weeks ago. Unfortunately he has c/o largely unchanged back pain since surgery, with fever/chills initially. This past Friday he noted drainage of thin yellow fluid. He does not report any positional headache. He was evaluated in the outpatient clinic today where he appeared to have active purulent drainage and presents now for wound washout.  Past Medical History   Past Medical History:  Diagnosis Date   Arthritis    BPH (benign prostatic hypertrophy)    CHF (congestive heart failure) (HCC)    Elevated PSA    Eye problem 03/06/2016   Dr. 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  Elevated LVEDP.;  09/2020: EF 20-25%.  GR 1 DD   Numbness    hands   Personal history of arthritis    Personal history of other diseases of circulatory system    Pre-diabetes    Sickle cell anemia (HCC)    pt denies    Past Surgical History   Past Surgical History:  Procedure Laterality Date   ANTERIOR CERVICAL DECOMP/DISCECTOMY FUSION N/A 04/20/2018   Procedure: Anterior Cervical Decompression Fusion - Cervical Three-Cervical Four - Cervical Four-Cervical Five - Cervical Five-Cervical Six;  Surgeon: Isadora Mar, MD;  Location: [facility] OR;  Service: Neurosurgery;  Laterality: N/A;  Anterior Cervical Decompression Fusion - Cervical Three-Cervical Four - Cervical Four-Cervical Five - Cervical Five-Cervical Six   APPENDECTOMY   AS CHILD   BACK SURGERY     BIV ICD INSERTION CRT-D N/A 10/27/2021   Procedure: BIV ICD INSERTION CRT-D;  Surgeon: Tammie Fall, MD;  Location: [facility] INVASIVE CV LAB;  Service: Cardiovascular;  Laterality: N/A;   CARDIOVASCULAR STRESS TEST  10/05/1998   MILD GLOBAL HYPOKINESIS AND ISCHEMIAIN ANTEROSEPTAL  AT [facility]/ EF 42%   CARPAL TUNNEL RELEASE Bilateral    CENTRAL LINE INSERTION  10/28/2021   Procedure: CENTRAL LINE INSERTION;  Surgeon: Lei Pump, MD;  Location: [facility] INVASIVE CV LAB;  Service: Cardiovascular;;   CENTRAL LINE INSERTION  10/28/2021   Procedure: CENTRAL LINE INSERTION;  Surgeon: Arnoldo Lapping, MD;  Location: [facility] INVASIVE CV LAB;  Service: Cardiovascular;;   COLONOSCOPY WITH PROPOFOL  N/A 03/14/2022   Procedure: COLONOSCOPY WITH PROPOFOL ;  Surgeon: Sergio Dandy, MD;  Location: [facility] ENDOSCOPY;  Service: Gastroenterology;  Laterality: N/A;   EXCISION MELANOMA LEFT UPPER BACK  10/14/2009   eye lid surgery Bilateral 2018   GOLD SEED IMPLANT N/A 07/23/2021   Procedure: GOLD SEED IMPLANT;  Surgeon: Andrez Banker, MD;  Location: [facility] ORS;  Service: Urology;  Laterality: N/A;   KNEE ARTHROSCOPY WITH SUBCHONDROPLASTY Left 04/07/2017   Procedure: LEFT KNEE ARTHROSCOPY WITH PARTIAL MEDIAL MENISCECTOMY AND MEDIAL TIBIAL SUBCHONDROPLASTY;  Surgeon: Arnie Lao, MD;  Location: [facility] ORS;  Service: Orthopedics;  Laterality: Left;   knee injections Bilateral    LEAD REVISION/REPAIR N/A 10/28/2021   Procedure: LEAD REVISION/REPAIR;  Surgeon: Lawana Pray,  Babetta Lesch, MD;  Location: [facility] INVASIVE CV LAB;  Service: Cardiovascular;  Laterality: N/A;   LEFT HEART CATH AND CORONARY ANGIOGRAPHY  04/2002   minimal irregularities in the LAD/  EF 50%  -   DR AL LITTLE   LUMBAR DISC SURGERY  09/12/2000   left  L5 -- S1   PERICARDIOCENTESIS N/A 10/28/2021   Procedure: PERICARDIOCENTESIS;  Surgeon: Lei Pump, MD;  Location: [facility] INVASIVE CV LAB;  Service: Cardiovascular;   Laterality: N/A;   PERICARDIOCENTESIS N/A 10/28/2021   Procedure: PERICARDIOCENTESIS;  Surgeon: Arnoldo Lapping, MD;  Location: [place] [facility] INVASIVE CV LAB;  Service: Cardiovascular;  Laterality: N/A;   PROSTATE BIOPSY N/A 05/22/2013   Procedure: BIOPSY TRANSRECTAL ULTRASONIC PROSTATE (TUBP);  Surgeon: Livingston Rigg, MD;  Location: [facility];  Service: Urology;  Laterality: N/A;   RIGHT/LEFT HEART CATH AND CORONARY ANGIOGRAPHY N/A 06/25/2020   Procedure: RIGHT/LEFT HEART CATH AND CORONARY ANGIOGRAPHY;  Surgeon: Arty Binning, MD;  Location: [facility] INVASIVE CV LAB;:; Normal coronary arteries; LVEDP 23 mmHg; PCWP 19 mmHg.   ROTATOR CUFF REPAIR Left 2016   SPACE OAR INSTILLATION N/A 07/23/2021   Procedure: SPACE OAR INSTILLATION;  Surgeon: Andrez Banker, MD;  Location: [facility] ORS;  Service: Urology;  Laterality: N/A;   THROAT SURGERY  04/20/2018   TRANSRECTAL ULTRASOUND PROSTATE BX  05-23-2005  &  04-19-2001   TRANSTHORACIC ECHOCARDIOGRAM  09/2017   EF 45-50 % (previously reported as 40 to 45%).  Incoordinate septal motion with mild LVH.  GR 1 DD.  Aortic sclerosis but no stenosis.   TRANSTHORACIC ECHOCARDIOGRAM  10/06/2020   Severely reduced EF of 20 to 25%.  No LV thrombus.  Severe septal-lateral wall systolic dyssynchrony due to LBBB.  Global HK.  Moderately dilated LV.  GR 1 DD with mild LA dilation..  Unable to assess PAP with normal RV size and function.  Normal RAP.  Mild AOV sclerosis but no stenosis.  Mild to moderate MR.   TRANSTHORACIC ECHOCARDIOGRAM  06/16/2020   Severely reduced EF <20%.  Moderate severely dilated LV.  GR 2 DD.  Elevated LVEDP.  Mild LA dilation.  Mildly reduced RV function.  Mild MR.  Mild aortic valve calcification    Social History   Social History   Tobacco Use   Smoking status: Former    Current packs/day: 0.00    Average packs/day: 2.0 packs/day for 20.0 years (40.0 ttl pk-yrs)    Types: Cigarettes    Start date: 03/28/1960    Quit date: 03/28/1980     Years since quitting: 43.4   Smokeless tobacco: Never  Vaping Use   Vaping status: Never Used  Substance Use Topics   Alcohol  use: No    Alcohol /week: 0.0 standard drinks of alcohol    Drug use: No    Medications   Prior to Admission medications   Medication Sig Start Date End Date Taking? Authorizing Provider  aspirin  EC 81 MG tablet Take 1 tablet (81 mg total) by mouth daily. 04/26/18  Yes Costella, Vincent J, PA-C  colchicine  0.6 MG tablet TAKE 1 TABLET TWICE DAILY 08/25/23  Yes Benjiman Bras, MD  Cyanocobalamin (VITAMIN B-12 PO) Take 1,000 mcg by mouth in the morning.   Yes [provider]  finasteride  (PROSCAR ) 5 MG tablet Take 5 mg by mouth at bedtime.   Yes [provider]  furosemide  (LASIX ) 40 MG tablet TAKE 1 TABLET TWICE DAILY Patient taking differently: Take 40 mg by mouth 2 (two) times a  week. 01/10/22  Yes Arleen Lacer, MD  gabapentin  (NEURONTIN ) 300 MG capsule Take 1-2 capsules (300-600 mg total) by mouth 3 (three) times daily as needed (pain). Patient taking differently: Take 900 mg by mouth in the morning, at noon, and at bedtime. 02/27/23  Yes Benjiman Bras, MD  hydrALAZINE  (APRESOLINE ) 25 MG tablet Take 1 tablet (25 mg total) by mouth in the morning and at bedtime. 08/25/23  Yes Arleen Lacer, MD  MAGNESIUM  PO Take 400 mg by mouth in the morning.   Yes [provider]  methocarbamol  (ROBAXIN ) 500 MG tablet Take 1 tablet (500 mg total) by mouth every 6 (six) hours as needed for muscle spasms. 08/08/23  Yes Meyran, Kenard Paul, NP  Multiple Vitamin (MULTIVITAMIN WITH MINERALS) TABS tablet Take 1 tablet by mouth in the morning. One-A-Day Men Health Multivitamin   Yes [provider]  naproxen  sodium (ALEVE ) 220 MG tablet Take 440 mg by mouth daily as needed (knee pain).   Yes [provider]  Omega-3 Fatty Acids (FISH OIL PO) Take 1,000 mg by mouth in the morning.   Yes [provider]   oxyCODONE -acetaminophen  (PERCOCET) 5-325 MG tablet Take 1 tablet by mouth every 4 (four) hours as needed for severe pain (pain score 7-10). 08/08/23 08/07/24 Yes Meyran, Kenard Paul, NP  rosuvastatin  (CRESTOR ) 5 MG tablet Take 1 tablet (5 mg total) by mouth every Monday, Wednesday, and Friday. Patient taking differently: Take 5 mg by mouth every Monday, Wednesday, and Friday. At night 01/25/23 07/26/26 Yes West, Katlyn D, NP  tamsulosin  (FLOMAX ) 0.4 MG CAPS capsule TAKE 1 CAPSULE EVERY DAY Patient taking differently: Take 0.4 mg by mouth at bedtime. 08/04/22  Yes Benjiman Bras, MD  Turmeric (QC TUMERIC COMPLEX PO) Take 1 tablet by mouth in the morning.   Yes [provider]  zinc  sulfate, 50mg  elemental zinc , 220 (50 Zn) MG capsule Take 220 mg by mouth daily.   Yes [provider]  acetaminophen  (TYLENOL ) 500 MG tablet Take 1,000 mg by mouth every 6 (six) hours as needed for headache.    [provider]    Allergies   Allergies  Allergen Reactions   Nitrostat [Nitroglycerin] Anaphylaxis    "Cardiologist suggested he shouldn't take this med because it could kill him with his heart condition. Lowers BP"   Ace Inhibitors Swelling   Mevacor  [Lovastatin ] Nausea Only   Solarcaine [Benzocaine] Dermatitis    Review of Systems  ROS  Neurologic Exam  Awake, alert, oriented Memory and concentration grossly intact Speech fluent, appropriate CN grossly intact Motor exam: Upper Extremities Deltoid Bicep Tricep Grip  Right 5/5 5/5 5/5 5/5  Left 5/5 5/5 5/5 5/5   Lower Extremities IP Quad PF DF EHL  Right 5/5 5/5 5/5 5/5 5/5  Left 5/5 5/5 5/5 5/5 5/5   Sensation grossly intact to LT  Wound with mild erythema. There is active drainage of thin yellow fluid from the mid-portion of the incision although no large area of dehiscence is seen.   Impression  - 78 y.o. male 3wks s/p L4-5 fusion with likely wound infection  Plan  - Will proceed with operative wound  exploration/washout   I have reviewed the indications for the procedure as well as the details of the procedure and the expected postoperative course and recovery with the patient. I reviewed the albeit unlikely need to place a wound vac. We have also reviewed in detail the risks, benefits, and alternatives to the procedure.  All questions were answered and [name] [name] provided informed consent to proceed.   Augusto Blonder, MD [facility]

## 2023-08-30 ENCOUNTER — Encounter (HOSPITAL_COMMUNITY): Payer: Self-pay | Admitting: Neurosurgery

## 2023-08-30 ENCOUNTER — Other Ambulatory Visit: Payer: Self-pay

## 2023-08-30 LAB — CBC
HCT: 30.5 % — ABNORMAL LOW (ref 39.0–52.0)
Hemoglobin: 9.7 g/dL — ABNORMAL LOW (ref 13.0–17.0)
MCH: 28.4 pg (ref 26.0–34.0)
MCHC: 31.8 g/dL (ref 30.0–36.0)
MCV: 89.2 fL (ref 80.0–100.0)
Platelets: 298 10*3/uL (ref 150–400)
RBC: 3.42 MIL/uL — ABNORMAL LOW (ref 4.22–5.81)
RDW: 13.1 % (ref 11.5–15.5)
WBC: 10.2 10*3/uL (ref 4.0–10.5)
nRBC: 0 % (ref 0.0–0.2)

## 2023-08-30 MED ORDER — SODIUM CHLORIDE 0.9 % IV SOLN
2.0000 g | Freq: Two times a day (BID) | INTRAVENOUS | Status: DC
  Administered 2023-08-30 – 2023-09-04 (×10): 2 g via INTRAVENOUS
  Filled 2023-08-30 (×11): qty 12.5

## 2023-08-30 MED ORDER — BISACODYL 5 MG PO TBEC
10.0000 mg | DELAYED_RELEASE_TABLET | Freq: Every day | ORAL | Status: DC | PRN
  Administered 2023-08-30 – 2023-09-02 (×3): 10 mg via ORAL
  Filled 2023-08-30 (×3): qty 2

## 2023-08-30 MED ORDER — OXYCODONE HCL 5 MG PO TABS
5.0000 mg | ORAL_TABLET | ORAL | Status: DC | PRN
  Administered 2023-08-31 – 2023-09-01 (×5): 10 mg via ORAL
  Administered 2023-09-01: 5 mg via ORAL
  Administered 2023-09-01 – 2023-09-02 (×4): 10 mg via ORAL
  Filled 2023-08-30 (×10): qty 2

## 2023-08-30 NOTE — Evaluation (Signed)
 Physical Therapy Brief Evaluation and Discharge Note Patient Details Name: [name] [name] MRN: [id number] DOB: 1945/09/16 Today's Date: 08/30/2023   History of Present Illness  Pt is a 78 y.o. M who presents 08/29/2023 with lumbar postoperative wound infection s/p wound exploration, washout and closure. Pt underwent L4-5 decompression and fusion about 3 weeks ago. Significant PMH: ICD, nonischemic cardiomyopathy, CHF, prior cervical surgeries, stroke (per pt).  Clinical Impression  Patient evaluated by Physical Therapy with no further acute PT needs identified. Pt reports good pain control, "1.5/10" and denies radicular pain. Pt is overall mobilizing well, ambulating unit and half flight of steps independently. Able to perform multidirectional stepping (laterally, backwards) and head turns without gross unsteadiness. Reviewed spinal precautions and brace use. All education has been completed and the patient has no further questions. No follow-up Physical Therapy or equipment needs. PT is signing off. Thank you for this referral.      PT Assessment Patient does not need any further PT services  Assistance Needed at Discharge  PRN    Equipment Recommendations None recommended by PT  Recommendations for Other Services       Precautions/Restrictions Precautions Precautions: Back Precaution Booklet Issued: Yes (comment) Recall of Precautions/Restrictions: Impaired Precaution/Restrictions Comments: Verbally reviewed and provided written handout Required Braces or Orthoses: Spinal Brace Spinal Brace: Lumbar corset;Applied in sitting position;Applied in standing position Restrictions Weight Bearing Restrictions Per Provider Order: No        Mobility  Bed Mobility       General bed mobility comments: Sitting EOB upon arrival  Transfers Overall transfer level: Independent Equipment used: None                    Ambulation/Gait Ambulation/Gait assistance:  Independent Gait Distance (Feet): 400 Feet Assistive device: None Gait Pattern/deviations: WFL(Within Functional Limits)      Home Activity Instructions    Stairs Stairs: Yes Stairs assistance: Modified independent (Device/Increase time) Stair Management: One rail Left Number of Stairs: 12    Modified Rankin (Stroke Patients Only)        Balance Overall balance assessment: No apparent balance deficits (not formally assessed)                        Pertinent Vitals/Pain PT - Brief Vital Signs All Vital Signs Stable: Yes Pain Assessment Pain Assessment: 0-10 Pain Score: 1  Pain Location: surgical site Pain Descriptors / Indicators: Operative site guarding Pain Intervention(s): Monitored during session     Home Living Family/patient expects to be discharged to:: Private residence Living Arrangements: Alone Available Help at Discharge: Friend(s);Available PRN/intermittently (girlfriend) Home Environment: Stairs to enter  [employer] of Steps: 4 Home Equipment: Agricultural consultant (2 wheels);Rollator (4 wheels)        Prior Function Level of Independence: Independent      UE/LE Assessment   UE ROM/Strength/Tone/Coordination: WFL    LE ROM/Strength/Tone/Coordination: [facility]      Communication   Communication Communication: No apparent difficulties     Cognition Overall Cognitive Status: Appears within functional limits for tasks assessed/performed       General Comments      Exercises     Assessment/Plan    PT Problem List Decreased strength;Decreased balance;Decreased mobility;Decreased knowledge of precautions;Pain       PT Visit Diagnosis Unsteadiness on feet (R26.81);Pain    No Skilled PT All education completed;Patient is independent with all acitivity/mobility   Co-evaluation  AMPAC 6 Clicks Help needed turning from your back to your side while in a flat bed without using bedrails?: None Help needed moving  from lying on your back to sitting on the side of a flat bed without using bedrails?: None Help needed moving to and from a bed to a chair (including a wheelchair)?: None Help needed standing up from a chair using your arms (e.g., wheelchair or bedside chair)?: None Help needed to walk in hospital room?: None Help needed climbing 3-5 steps with a railing? : None 6 Click Score: 24      End of Session Equipment Utilized During Treatment: Back brace Activity Tolerance: Patient tolerated treatment well Patient left: in chair;with call bell/phone within reach Nurse Communication: Mobility status PT Visit Diagnosis: Unsteadiness on feet (R26.81);Pain Pain - part of body:  (back)     Time: 1610-9604 PT Time Calculation (min) (ACUTE ONLY): 12 min  Charges:   PT Evaluation $PT Eval Low Complexity: 1 Low      Verdia Glad, PT, DPT Acute Rehabilitation Services Office [phone]   Claria Crofts  08/30/2023, 8:21 AM

## 2023-08-30 NOTE — Progress Notes (Signed)
 Subjective: Patient reports doing ok, having some back pain in the last two hours   Objective: Vital signs in last 24 hours: Temp:  [97.6 F (36.4 C)-98.2 F (36.8 C)] 97.8 F (36.6 C) (06/04 0736) Pulse Rate:  [68-99] 99 (06/04 0736) Resp:  [9-18] 18 (06/04 0736) BP: (108-128)/(56-75) 123/56 (06/04 0736) SpO2:  [95 %-100 %] 97 % (06/04 0736) Weight:  [87.5 kg] 87.5 kg (06/03 1354)  Intake/Output from previous day: 06/03 0701 - 06/04 0700 In: 1040 [P.O.:240; I.V.:700; IV Piggyback:100] Out: 50 [Blood:50] Intake/Output this shift: No intake/output data recorded.  Neurologic: Grossly normal  Lab Results: Lab Results  Component Value Date   WBC 10.2 08/30/2023   HGB 9.7 (L) 08/30/2023   HCT 30.5 (L) 08/30/2023   MCV 89.2 08/30/2023   PLT 298 08/30/2023   Lab Results  Component Value Date   INR 1.0 07/28/2023   BMET Lab Results  Component Value Date   NA 137 08/29/2023   K 4.4 08/29/2023   CL 102 08/29/2023   CO2 28 07/12/2023   GLUCOSE 101 (H) 08/29/2023   BUN 24 (H) 08/29/2023   CREATININE 1.20 08/29/2023   CALCIUM  9.7 07/12/2023    Studies/Results: No results found.  Assessment/Plan: Postop day 1 lumbar wound washout. Awaiting cultures. Continue therapy for now    LOS: 1 day    Kenard Paul Shaunessy Dobratz 08/30/2023, 1:41 PM

## 2023-08-30 NOTE — Evaluation (Signed)
 Occupational Therapy Evaluation Patient Details Name: [name] [name] MRN: [id number] DOB: 09/14/1945 Today's Date: 08/30/2023   History of Present Illness   78 y.o. M who presents 08/29/2023 with lumbar postoperative wound infection s/p wound exploration, washout and closure. Pt underwent L4-5 decompression and fusion about 3 weeks ago. Significant PMH: ICD, nonischemic cardiomyopathy, CHF, prior cervical surgeries, stroke (per pt).     Clinical Impressions Patient is s/p L4-5 wound exploration and washout with closure surgery resulting in functional limitations due to the deficits listed below (see OT problem list). Pt lives alone and completes all adls/ driving indep. Pt benefits from back precautions with adls. Pt declining AE purchase at this time and at risk for injury dressing R LE.  Patient will benefit from skilled OT acutely to increase independence and safety with ADLS to allow discharge home with family support.      If plan is discharge home, recommend the following:   Assistance with cooking/housework;Assist for transportation     Functional Status Assessment         Equipment Recommendations         Recommendations for Other Services   Other (comment) (home RN)     Precautions/Restrictions   Precautions Precautions: Back Precaution Booklet Issued: Yes (comment) Recall of Precautions/Restrictions: Impaired Precaution/Restrictions Comments: Verbally reviewed and provided written handout Required Braces or Orthoses: Spinal Brace Spinal Brace: Lumbar corset;Applied in sitting position;Applied in standing position Restrictions Weight Bearing Restrictions Per Provider Order: No     Mobility Bed Mobility               General bed mobility comments: oob on arrival    Transfers Overall transfer level: Independent Equipment used: None                      Balance                                           ADL  either performed or assessed with clinical judgement   ADL Overall ADL's : Needs assistance/impaired                     Lower Body Dressing: Supervision/safety;With adaptive equipment Lower Body Dressing Details (indicate cue type and reason): pt is able to figure 4 cross the L LE. pt unable to lift R LE to cross. educated with return demo of sock aide. pt reports "this is what i needed" and then reports "no i dont want it when educated that insurance does not cover" OT provided handout of all the places that it can be purchased and provided some generalized cost for out of pocket. pt states he will consider it. Toilet Transfer: Supervision/safety                   Vision   Vision Assessment?: No apparent visual deficits     Perception         Praxis         Pertinent Vitals/Pain Pain Assessment Pain Assessment: 0-10 Pain Score: 1  Pain Location: surgical site Pain Descriptors / Indicators: Operative site guarding Pain Intervention(s): Monitored during session, Premedicated before session, Repositioned     Extremity/Trunk Assessment Upper Extremity Assessment Upper Extremity Assessment: Overall WFL for tasks assessed   Lower Extremity Assessment Lower Extremity Assessment: Defer to PT evaluation   Cervical / Trunk Assessment  Cervical / Trunk Assessment: Back Surgery   Communication Communication Communication: No apparent difficulties Factors Affecting Communication: Hearing impaired   Cognition Arousal: Alert Behavior During Therapy: WFL for tasks assessed/performed Cognition: Cognition impaired     Awareness: Intellectual awareness intact, Online awareness impaired       OT - Cognition Comments: pt not recall of precautions initially. pt able to state precautions with poor carry over during adl task particularly with R LE dressing. pt educated on need for AE for precautions. pt expressed not wanting it if insurance does not cover the cost                  Following commands: Intact       Cueing  General Comments      wound vac in place. pt educated that he can not shower until removed. pt requesting home care for antibotics and wound vac. Rn made aware. Pt would benefit from shower seat as well but does not want to pay out of pocket for the cost.   Exercises     Shoulder Instructions      Home Living Family/patient expects to be discharged to:: Private residence Living Arrangements: Alone Available Help at Discharge: Friend(s) Type of Home: House Home Access: Stairs to enter [employer] of Steps: 5 to 6 Entrance Stairs-Rails: Right Home Layout: Two level     Bathroom Shower/Tub: Tub/shower unit;Door   Bathroom Toilet: Handicapped height     Home Equipment: Agricultural consultant (2 wheels);Rollator (4 wheels)   Additional Comments: has outdoor cat that food is on a higher surface so no pending required. girlfriend [name] lives 30 miles away and cares for 55mo grandchild during the day. Pt has two sons local that can help with care if needed      Prior Functioning/Environment Prior Level of Function : Independent/Modified Independent;Driving                    OT Problem List: Decreased safety awareness;Decreased knowledge of use of DME or AE   OT Treatment/Interventions: Self-care/ADL training;DME and/or AE instruction;Therapeutic activities;Patient/family education;Balance training      OT Goals(Current goals can be found in the care plan section)   Acute Rehab OT Goals Patient Stated Goal: to see the doctor today OT Goal Formulation: With patient Time For Goal Achievement: 09/13/23 Potential to Achieve Goals: Good   OT Frequency:  Min 2X/week    Co-evaluation              AM-PAC OT "6 Clicks" Daily Activity     Outcome Measure Help from another person eating meals?: None Help from another person taking care of personal grooming?: None Help from another person toileting,  which includes using toliet, bedpan, or urinal?: None Help from another person bathing (including washing, rinsing, drying)?: None Help from another person to put on and taking off regular upper body clothing?: None Help from another person to put on and taking off regular lower body clothing?: A Little 6 Click Score: 23   End of Session Equipment Utilized During Treatment: Back brace Nurse Communication: Mobility status;Precautions  Activity Tolerance: Patient tolerated treatment well Patient left: in chair  OT Visit Diagnosis: Muscle weakness (generalized) (M62.81)                Time: 2952-8413 OT Time Calculation (min): 50 min Charges:  OT General Charges $OT Visit: 1 Visit OT Evaluation $OT Eval Low Complexity: 1 Low   Brynn, OTR/L  Acute Rehabilitation Services  Office: [phone] .   Neomia Banner 08/30/2023, 10:18 AM

## 2023-08-30 NOTE — Progress Notes (Signed)
 Pharmacy Antibiotic Note  [name] [name] is a 78 y.o. male who underwent L4-5 decompression and fusion 3 weeks ago.  Patient presented back to office for postoperative visit with fever and drainage from his wound for the last 3 or 4 days. Per the op note, yellowish fluid was encountered from the topical portion of his wound. Patient is now s/p exploration and washout. Pharmacy has been consulted for vancomycin and cefepime dosing.  UPDATE: 08/30/23:  SCr today is 1.2 and BUN elevated to 24, an increase from prior SCr /BUN of 1.06 and 19 on 07/12/23.   Antimicrobials this admission: Cefepime 6/3>> Linezolid 6/3>> Cefazolin  6/3 x1  preop Vanc 6/3 x1   Micro: 6/3 wound cx:  ngtd  Plan: Adjust Cefepime dose down to 2g IV Q12h due to CrCl <60 ml/min.  Trend WBC, fever, renal function F/u cultures, clinical progress, levels as indicated De-escalate when able   Height: 5\' 8"  (172.7 cm) Weight: 87.5 kg (193 lb) IBW/kg (Calculated) : 68.4  Temp (24hrs), Avg:97.9 F (36.6 C), Min:97.6 F (36.4 C), Max:98.2 F (36.8 C)  Recent Labs  Lab 08/29/23 1357  CREATININE 1.20    Estimated Creatinine Clearance: 53.7 mL/min (by C-G formula based on SCr of 1.2 mg/dL).    Allergies  Allergen Reactions   Nitrostat [Nitroglycerin] Anaphylaxis    "Cardiologist suggested he shouldn't take this med because it could kill him with his heart condition. Lowers BP"   Vancomycin Hives    08/29/23 per confirmation with RN, shortly after starting vancomycin infusion patient developed itching and a rash on his torso   Ace Inhibitors Swelling   Mevacor  [Lovastatin ] Nausea Only   Solarcaine [Benzocaine] Dermatitis   Thank you for allowing pharmacy to be a part of this patient's care.  Alisa Irish, RPh Clinical Pharmacist Please check AMION for all [facility] Pharmacy phone numbers After 10:00 PM, call Main Pharmacy [phone]

## 2023-08-31 MED ORDER — MAGNESIUM CITRATE PO SOLN
1.0000 | Freq: Every day | ORAL | Status: DC | PRN
  Administered 2023-08-31 – 2023-09-03 (×2): 1 via ORAL
  Filled 2023-08-31 (×3): qty 296

## 2023-08-31 NOTE — Progress Notes (Signed)
 OT Cancellation Note  Patient Details Name: [name] [name] MRN: [id number] DOB: 04/07/1945   Cancelled Treatment:    Reason Eval/Treat Not Completed: Pain limiting ability to participate. Pt c/o of significant pain, verbalizing recently receiving pain medications. Agreeable for OT to return at a later point.    Othell Jaime C, OT  Acute Rehabilitation Services Office [phone] Secure chat preferred   [name] [name] 08/31/2023, 7:40 AM

## 2023-08-31 NOTE — Progress Notes (Signed)
 Patient alert and oriented, void, ambulate with x1 person assistant.  Surgical wound is clean and dry.  Report given to patricia 6N RN, all questions answered. Patient transfer to 6N per order.

## 2023-08-31 NOTE — TOC Initial Note (Addendum)
 Transition of Care (TOC) - Initial/Assessment Note   Patient just transferred to 6n. Spoke to patient at bedside. From home alone. Sons work out of town, he only sees them about once every 2 to 3 weeks.   Friend [name] lives 30 miles away and is a Dispensing optician , therefore she cannot help him at home.   PT recommendation no follow therapy follow up no DME. PT signed off   Patient states he has been hurting for a month and cannot go home alone.   OT to see him today . Await any updated recommendations   Await ID recommendations  Patient Details  Name: [name] [name] MRN: [id number] Date of Birth: 11-Jan-1946  Transition of Care [facility]) CM/SW Contact:    Terre Ferri, RN Phone Number: 08/31/2023, 1:30 PM  Clinical Narrative:                   Expected Discharge Plan:  (see note) Barriers to Discharge: Continued Medical Work up   Patient Goals and CMS Choice Patient states their goals for this hospitalization and ongoing recovery are:: see note          Expected Discharge Plan and Services   Discharge Planning Services: CM Consult   Living arrangements for the past 2 months: Single Family Home                 DME Arranged: N/A                    Prior Living Arrangements/Services Living arrangements for the past 2 months: Single Family Home Lives with:: Self Patient language and need for interpreter reviewed:: Yes Do you feel safe going back to the place where you live?:  (currently in a lot of pain and has no help at home)      Need for Family Participation in Patient Care: Yes (Comment) Care giver support system in place?: No (comment)   Criminal Activity/Legal Involvement Pertinent to Current Situation/Hospitalization: No - Comment as needed  Activities of Daily Living   ADL Screening (condition at time of admission) Independently performs ADLs?: Yes (appropriate for developmental age) Is the patient deaf or have difficulty hearing?: Yes Does the  patient have difficulty seeing, even when wearing glasses/contacts?: Yes (right eye) Does the patient have difficulty concentrating, remembering, or making decisions?: No  Permission Sought/Granted   Permission granted to share information with : No              Emotional Assessment Appearance:: Appears stated age Attitude/Demeanor/Rapport: Engaged Affect (typically observed): Appropriate Orientation: : Oriented to Self, Oriented to Place, Oriented to  Time, Oriented to Situation Alcohol  / Substance Use: Not Applicable Psych Involvement: No (comment)  Admission diagnosis:  Postoperative wound infection [T81.49XA] Postoperative wound breakdown [T81.31XA] Patient Active Problem List   Diagnosis Date Noted   Postoperative wound breakdown 08/29/2023   S/P lumbar fusion 08/07/2023   Foreign body in right ear 06/09/2023   Impacted cerumen of left ear 06/09/2023   Fatigue 08/17/2022   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   Hx of colonic polyps 01/10/2022   Atrial fibrillation, transient (HCC) 11/02/2021   Hypotension    Chest pain 10/28/2021   Prostate cancer (HCC) 04/02/2021   Chronic HFrEF (heart failure with reduced ejection fraction) (HCC) 05/20/2020   Claudication (HCC) 09/01/2019   S/P cervical spinal fusion 04/20/2018   Paresthesia 01/10/2018   Neck pain 01/10/2018   LBBB (left bundle branch block) 09/07/2017  Other meniscus derangements, posterior horn of medial meniscus, left knee, insufficency fracture medial tibial plateau 04/07/2017   Closed nondisplaced fracture of lateral condyle of left tibia with routine healing 04/07/2017   Status post arthroscopy of left knee 04/07/2017   Insomnia 09/14/2015   Hyperlipidemia with target LDL less than 100 09/11/2014   Rotator cuff tendinitis 04/28/2014   Obesity (BMI 30-39.9) 12/29/2013   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   Exertional shortness of breath 06/26/2012   Polyp of colon,  adenomatous 04/27/2012   Elevated PSA 10/20/2011   Hypertriglyceridemia 07/08/2011   Nonischemic cardiomyopathy (HCC) 09/04/2009   PCP:  Benjiman Bras, MD Pharmacy:   [facility] - [address], [place] - [address] [place] [address] Phone: [phone] Fax: [phone]  [facility] [address] [place] [address] Phone: [phone] Fax: [phone]  [id number] - [place], [place] - [address] [place] [address] Phone: [phone] Fax: [phone]     Social Drivers of Health (SDOH) Social History: SDOH Screenings   Food Insecurity: No Food Insecurity (08/31/2023)  Housing: Low Risk  (08/31/2023)  Transportation Needs: No Transportation Needs (08/31/2023)  Utilities: Not At Risk (08/31/2023)  Alcohol  Screen: Low Risk  (12/01/2022)  Depression (PHQ2-9): Low Risk  (05/24/2023)  Financial Resource Strain: Low Risk  (12/01/2022)  Physical Activity: Sufficiently Active (12/01/2022)  Social Connections: Moderately Isolated (08/31/2023)  Stress: No Stress Concern Present (12/01/2022)  Tobacco Use: Medium Risk (08/29/2023)  Health Literacy: Adequate Health Literacy (12/01/2022)   SDOH Interventions:     Readmission Risk Interventions     No data to display

## 2023-08-31 NOTE — Progress Notes (Signed)
 Occupational Therapy Treatment Patient Details Name: [name] [name] MRN: [id number] DOB: 1945-09-25 Today's Date: 08/31/2023   History of present illness 78 y.o. M who presents 08/29/2023 with lumbar postoperative wound infection s/p wound exploration, washout and closure. Pt underwent L4-5 decompression and fusion about 3 weeks ago. Significant PMH: ICD, nonischemic cardiomyopathy, CHF, prior cervical surgeries, stroke (per pt).   OT comments  Pt progressing towards goals. C/o of increased post-op pain. Pt unable to recall precautions, when educated on precautions pt states "how am I supposed to do anything". OT re-educated pt on back precautions handout and compensatory strategies to manage at home. Pt perseverating on managing IV upon d/c, despite OT continuously encouraging pt to refer to MD or RN regarding medications. Pt requesting to mobilize in hallway. With initial STS pt unsteady and reaching for UE support via walls, pt declining use of RW or HH support. Pt requiring CGA for mobility, potentially d/t medications. Pt able to return demo and verbalize understanding, will continue to assess for carryover at future sessions. Will continue to follow acutely.      If plan is discharge home, recommend the following:  Assistance with cooking/housework;Assist for transportation   Equipment Recommendations  None recommended by OT    Recommendations for Other Services Other (comment) (Home rn)    Precautions / Restrictions Precautions Precautions: Back Precaution Booklet Issued: Yes (comment) Recall of Precautions/Restrictions: Impaired Precaution/Restrictions Comments: Verbally reviewed and provided written handout Required Braces or Orthoses: Spinal Brace Spinal Brace: Lumbar corset;Applied in sitting position;Applied in standing position Restrictions Weight Bearing Restrictions Per Provider Order: No       Mobility Bed Mobility Overal bed mobility: Needs Assistance Bed  Mobility: Rolling, Sidelying to Sit, Sit to Sidelying Rolling: Min assist, Used rails Sidelying to sit: Min assist, Used rails     Sit to sidelying: Supervision, HOB elevated, Used rails General bed mobility comments: Educated on log roll, light HH assist provided, pt able to return to supine no assist    Transfers Overall transfer level: Needs assistance Equipment used: None Transfers: Sit to/from Stand, Bed to chair/wheelchair/BSC Sit to Stand: Contact guard assist     Step pivot transfers: Contact guard assist     General transfer comment: With inital STS CGA close to min assist to steady, during mobility pt constantly reaching for UE support on nearby walls     Balance Overall balance assessment: Needs assistance Sitting-balance support: No upper extremity supported, Feet supported Sitting balance-Leahy Scale: Fair     Standing balance support: No upper extremity supported, During functional activity Standing balance-Leahy Scale: Poor Standing balance comment: Benefits from UE support but declines HH assist or use of RW         ADL either performed or assessed with clinical judgement   ADL Overall ADL's : Needs assistance/impaired       Lower Body Dressing: Minimal assistance;Sit to/from stand Lower Body Dressing Details (indicate cue type and reason): pt still unable to figure 4 RLE, educated on compensatory strategies Toilet Transfer: Contact guard assist           Functional mobility during ADLs: Contact guard assist General ADL Comments: Educated on the importance of maintaining back precautions and using compensatory strategies for ADLs    Extremity/Trunk Assessment Upper Extremity Assessment Upper Extremity Assessment: Overall WFL for tasks assessed   Lower Extremity Assessment Lower Extremity Assessment: Defer to PT evaluation        Vision   Vision Assessment?: No apparent visual deficits  Communication Communication Communication:  No apparent difficulties Factors Affecting Communication: Hearing impaired   Cognition Arousal: Alert Behavior During Therapy: Agitated Cognition: Cognition impaired     Awareness: Intellectual awareness intact, Online awareness impaired       OT - Cognition Comments: pt unable to recall precautions when asked, when reviewed pt stating "how am I supposed to function"                 Following commands: Intact        Cueing   Cueing Techniques: Verbal cues        General Comments Wound vac in place    Pertinent Vitals/ Pain       Pain Assessment Pain Assessment: Faces Faces Pain Scale: Hurts whole lot Pain Location: surgical site Pain Descriptors / Indicators: Operative site guarding Pain Intervention(s): Monitored during session   Frequency  Min 2X/week        Progress Toward Goals  OT Goals(current goals can now be found in the care plan section)  Progress towards OT goals: Progressing toward goals  Acute Rehab OT Goals Patient Stated Goal: To lay down OT Goal Formulation: With patient Time For Goal Achievement: 09/13/23 Potential to Achieve Goals: Good ADL Goals Pt Will Perform Upper Body Bathing: with modified independence;with adaptive equipment Pt Will Perform Lower Body Bathing: with modified independence;with adaptive equipment Pt Will Perform Upper Body Dressing: with modified independence;with adaptive equipment Pt Will Perform Lower Body Dressing: with modified independence;with adaptive equipment Additional ADL Goal #1: pt will complete bed mobility mod i  Plan         AM-PAC OT "6 Clicks" Daily Activity     Outcome Measure   Help from another person eating meals?: None Help from another person taking care of personal grooming?: None Help from another person toileting, which includes using toliet, bedpan, or urinal?: None Help from another person bathing (including washing, rinsing, drying)?: None Help from another person to put on  and taking off regular upper body clothing?: None Help from another person to put on and taking off regular lower body clothing?: A Little 6 Click Score: 23    End of Session Equipment Utilized During Treatment: Back brace  OT Visit Diagnosis: Muscle weakness (generalized) (M62.81)   Activity Tolerance Patient limited by pain   Patient Left in bed;with call bell/phone within reach   Nurse Communication Mobility status;Precautions        Time: 1610-9604 OT Time Calculation (min): 23 min  Charges: OT General Charges $OT Visit: 1 Visit OT Treatments $Self Care/Home Management : 23-37 mins  Delmer Ferraris, OT  Acute Rehabilitation Services Office [phone] Secure chat preferred   Mickael Alamo 08/31/2023, 3:39 PM

## 2023-08-31 NOTE — Progress Notes (Signed)
 Received patient from 64M via wheelchair.  Moaning and groaning.  When I went to evaluate patient  he was in tears and complaining of spasms.  Called office for guidance as Meyran is in surgery

## 2023-08-31 NOTE — Progress Notes (Signed)
 Subjective: Patient reports having a lot of back pain  Objective: Vital signs in last 24 hours: Temp:  [97.8 F (36.6 C)-98.1 F (36.7 C)] 98.1 F (36.7 C) (06/05 0725) Pulse Rate:  [72-86] 72 (06/05 0725) Resp:  [18-20] 20 (06/05 0725) BP: (107-120)/(56-69) 113/69 (06/05 0725) SpO2:  [95 %-100 %] 100 % (06/05 0725)  Intake/Output from previous day: 06/04 0701 - 06/05 0700 In: 720 [P.O.:720] Out: 0  Intake/Output this shift: No intake/output data recorded.  Neurologic: Grossly normal  Lab Results: Lab Results  Component Value Date   WBC 10.2 08/30/2023   HGB 9.7 (L) 08/30/2023   HCT 30.5 (L) 08/30/2023   MCV 89.2 08/30/2023   PLT 298 08/30/2023   Lab Results  Component Value Date   INR 1.0 07/28/2023   BMET Lab Results  Component Value Date   NA 137 08/29/2023   K 4.4 08/29/2023   CL 102 08/29/2023   CO2 28 07/12/2023   GLUCOSE 101 (H) 08/29/2023   BUN 24 (H) 08/29/2023   CREATININE 1.20 08/29/2023   CALCIUM  9.7 07/12/2023    Studies/Results: No results found.  Assessment/Plan: Postop day 2 lumbar wound I&D. Awaiting ID recommendations. Work on pain control and bowel regimen.    LOS: 2 days    [facility] 08/31/2023, 7:55 AM

## 2023-09-01 ENCOUNTER — Other Ambulatory Visit (HOSPITAL_COMMUNITY): Payer: Self-pay

## 2023-09-01 ENCOUNTER — Telehealth (HOSPITAL_COMMUNITY): Payer: Self-pay | Admitting: Pharmacy Technician

## 2023-09-01 LAB — CBC WITH DIFFERENTIAL/PLATELET
Abs Immature Granulocytes: 0.04 10*3/uL (ref 0.00–0.07)
Basophils Absolute: 0.1 10*3/uL (ref 0.0–0.1)
Basophils Relative: 1 %
Eosinophils Absolute: 0 10*3/uL (ref 0.0–0.5)
Eosinophils Relative: 0 %
HCT: 33.8 % — ABNORMAL LOW (ref 39.0–52.0)
Hemoglobin: 10.8 g/dL — ABNORMAL LOW (ref 13.0–17.0)
Immature Granulocytes: 0 %
Lymphocytes Relative: 23 %
Lymphs Abs: 2.2 10*3/uL (ref 0.7–4.0)
MCH: 28.3 pg (ref 26.0–34.0)
MCHC: 32 g/dL (ref 30.0–36.0)
MCV: 88.7 fL (ref 80.0–100.0)
Monocytes Absolute: 0.8 10*3/uL (ref 0.1–1.0)
Monocytes Relative: 9 %
Neutro Abs: 6.2 10*3/uL (ref 1.7–7.7)
Neutrophils Relative %: 67 %
Platelets: 260 10*3/uL (ref 150–400)
RBC: 3.81 MIL/uL — ABNORMAL LOW (ref 4.22–5.81)
RDW: 13.2 % (ref 11.5–15.5)
WBC: 9.2 10*3/uL (ref 4.0–10.5)
nRBC: 0 % (ref 0.0–0.2)

## 2023-09-01 LAB — CREATININE, SERUM
Creatinine, Ser: 1.22 mg/dL (ref 0.61–1.24)
GFR, Estimated: >60 mL/min (ref >60)

## 2023-09-01 LAB — C-REACTIVE PROTEIN: CRP: 7 mg/dL — ABNORMAL HIGH (ref <1.0)

## 2023-09-01 LAB — SEDIMENTATION RATE: Sed Rate: 87 mm/h — ABNORMAL HIGH (ref 0–16)

## 2023-09-01 NOTE — Care Management Important Message (Signed)
 Important Message  Patient Details  Name: [name] [name] MRN: [id number] Date of Birth: [birth year]   Important Message Given:  Yes - Medicare IM     Felix Host 09/01/2023, 12:45 PM

## 2023-09-01 NOTE — Progress Notes (Signed)
 NEUROSURGERY PROGRESS NOTE Status post lumbar wound irrigation and debridement Doing well. No pain Ambulating and voiding well Good strength and sensation Incision CDI  Temp:  [97.7 F (36.5 C)-98.2 F (36.8 C)] 98 F (36.7 C) (06/06 0745) Pulse Rate:  [77-85] 78 (06/06 0745) Resp:  [16-19] 16 (06/06 0745) BP: (103-111)/(50-75) 104/61 (06/06 0745) SpO2:  [97 %-100 %] 97 % (06/06 0745)  Plan: Prevena dressing was removed by patient.  I took the rest of the dressing off.  Okay to leave it open to air.  Okay to discharge home today once we have recommendations by infectious disease.  Jeannette Mills, NP 09/01/2023 12:34 PM

## 2023-09-01 NOTE — Progress Notes (Deleted)
 Marlana Silvan notified this AM of pts Prevena Pump disconnected and dead after being pulled off by pt.  Marlana Silvan stated this was Dr. Cleven Dallas pt and Abraham Hoffmann, [place].  Abraham Hoffmann PA added to chat and stated this was Dr. Pearlie Bougie Pt and would let him know.    As of 1200 this evening, had not heard update of prevena. Unable to page Dr. Nat Badger, called and told he was in OR but gave me a number to his office.  Called office and left a message.   As of 1400, have not heard of updates on what should be done with this pump.

## 2023-09-01 NOTE — Progress Notes (Signed)
 The Pt's Prevena pump was dead and the dressing was tore off, made the provider aware and also added the day shift nurse Shelagh Derrick, Charity fundraiser. To the chat.

## 2023-09-01 NOTE — Telephone Encounter (Signed)
 Patient Product/process development scientist completed.    The patient is insured through [employer]. Patient has Medicare and is not eligible for a copay card, but may be able to apply for patient assistance or Medicare RX Payment Plan (Patient Must reach out to their plan, if eligible for payment plan), if available.    Ran test claim for Linezolid 600 mg and the current 30 day co-pay is $145.86 due to a $250.00 deductible.   This test claim was processed through [facility]- copay amounts may vary at other pharmacies due to pharmacy/plan contracts, or as the patient moves through the different stages of their insurance plan.     Morgan Arab, CPHT Pharmacy Technician III Certified Patient Advocate [facility] Patient Advocate Team Direct Number: [phone]  Fax: [phone]

## 2023-09-01 NOTE — Consult Note (Signed)
 [facility]  Total days of antibiotics 2 linezolid Reason for Consult:early SSI from    Referring Physician: Nat Badger  Principal Problem:   Postoperative wound breakdown    HPI: [name] [name] is a 78 y.o. male who underwent decompressing lumbar laminectomy with PLIF L4-5. For claudication on 5/12, but still has had back pain. He reports that he started to have drainage from wound and fever and chills on 5/30, then started to notice more purulent drainage by 6/2. He was admitted for wash out on 6/3. OR note suggests that there was no obvious fascial dehiscence and infection was limited to subcutaneous pocket. 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 Sensitivities are pending. No longer having chills, rigors ,nightsweats. Still ongoing back pain.  Past Medical History:  Diagnosis Date   Arthritis    BPH (benign prostatic hypertrophy)    CHF (congestive heart failure) (HCC)    Elevated PSA    Eye problem 03/06/2016   Dr. 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  Elevated LVEDP.;  09/2020: EF 20-25%.  GR 1 DD   Numbness    hands   Personal history of arthritis    Personal history of other diseases of circulatory system    Pre-diabetes    Sickle cell anemia (HCC)    pt denies    Allergies:  Allergies  Allergen Reactions   Nitrostat [Nitroglycerin] Anaphylaxis    "Cardiologist suggested he shouldn't take this med because it could kill him with his heart condition. Lowers BP"   Vancomycin Hives     08/29/23 per confirmation with RN, shortly after starting vancomycin infusion patient developed itching and a rash on his torso   Ace Inhibitors Swelling   Mevacor  [Lovastatin ] Nausea Only   Solarcaine [Benzocaine] Dermatitis     MEDICATIONS:  colchicine   0.6 mg Oral BID   docusate sodium   100 mg Oral BID   finasteride   5 mg Oral QHS   furosemide   40 mg Oral Once per day on Monday Thursday   gabapentin   300 mg Oral TID   hydrALAZINE   25 mg Oral Q12H   multivitamin with minerals  1 tablet Oral Daily   pantoprazole  40 mg Oral QHS   rosuvastatin   5 mg Oral Q M,W,F   senna  1 tablet Oral BID   sodium chloride  flush  3 mL Intravenous Q12H   tamsulosin   0.4 mg Oral QHS    Social History   Tobacco Use   Smoking status: Former    Current packs/day: 0.00    Average packs/day: 2.0 packs/day for 20.0 years (40.0 ttl pk-yrs)    Types: Cigarettes    Start date: 03/28/1960    Quit date: 03/28/1980    Years since quitting: 43.4   Smokeless tobacco: Never  Vaping Use   Vaping status: Never Used  Substance Use Topics   Alcohol  use: No    Alcohol /week: 0.0 standard drinks of alcohol    Drug use: No  Family History  Problem Relation Age of Onset   Arthritis Mother    COPD Father    Lung cancer Father    Cancer Sister        type unknown   Breast cancer Sister    Lung cancer Sister    Cardiomyopathy Other        idiopathic. trivial disease 2004 cath, 2010 cardiac CT - no sig. dz, nl LV fxn. cards: Little   Colon cancer Neg Hx     Review of Systems -  +rigors, fevers, chills and wound drainage prior to hospitalization. Now those symptoms improved.  OBJECTIVE: Temp:  [97.5 F (36.4 C)-98.2 F (36.8 C)] 98 F (36.7 C) (06/06 0745) Pulse Rate:  [77-85] 78 (06/06 0745) Resp:  [16-20] 16 (06/06 0745) BP: (102-111)/(50-75) 104/61 (06/06 0745) SpO2:  [97 %-100 %] 97 % (06/06 0745) Physical Exam  Constitutional: He is oriented to person, place, and time. He appears well-developed  and well-nourished. No distress.  HENT:  Mouth/Throat: Oropharynx is clear and moist. No oropharyngeal exudate.  Cardiovascular: Normal rate, regular rhythm and normal heart sounds. Exam reveals no gallop and no friction rub.  No murmur heard.  Pulmonary/Chest: Effort normal and breath sounds normal. No respiratory distress. He has no wheezes.  Abdominal: Soft. Bowel sounds are normal. He exhibits no distension. There is no tenderness.  Back = incision is bandaged Neurological: He is alert and oriented to person, place, and time.  Skin: Skin is warm and dry. No rash noted. No erythema.  Psychiatric: He has a normal mood and affect. His behavior is normal.    LABS: Results for orders placed or performed during the hospital encounter of 08/29/23 (from the past 48 hours)  Creatinine, serum     Status: None   Collection Time: 09/01/23  6:44 AM  Result Value Ref Range   Creatinine, Ser 1.22 0.61 - 1.24 mg/dL   GFR, Estimated >40 >98 mL/min    Comment: (NOTE) Calculated using the CKD-EPI Creatinine Equation (2021) Performed at [facility], [address] [place], [place] [address]     MICRO: OR specimen - rare s.lugdunensis IMAGING: No results found.  HISTORICAL MICRO/IMAGING  Assessment/Plan:  78yoM with recent fusion who started to have wound dehiscence and purulent drainage with associated symptoms of fever, and chills. He is pod#3 from debridement, infection limited to above fascial plane, in subcutaneous region. Cx showing s.lugdunensis.  - patient is currently on linezolid, awaiting sensitivities from s.lugdunensis. - can conceivably send out on orals for 2-3 wk of treatment - will check sed rate and crp - continue on linezolid  - post operative anemia = hgb at 9.7. will check cbc  evaluation of this patient requires complex antimicrobial therapy evaluation and counseling and isolation needs for disease transmission risk assessment and mitigation.

## 2023-09-01 NOTE — Progress Notes (Signed)
 On call provider called back, made him aware of the pt's BP issues. MD stated it was fine and to continue with pain med admin.

## 2023-09-01 NOTE — Plan of Care (Signed)

## 2023-09-01 NOTE — Progress Notes (Signed)
 Mobility Specialist Progress Note:    09/01/23 1500  Mobility  Activity Ambulated with assistance in hallway  Level of Assistance Standby assist, set-up cues, supervision of patient - no hands on  Assistive Device None  Distance Ambulated (ft) 400 ft  Activity Response Tolerated well  Mobility Referral Yes  Mobility visit 1 Mobility  Mobility Specialist Start Time (ACUTE ONLY) 1440  Mobility Specialist Stop Time (ACUTE ONLY) 1450  Mobility Specialist Time Calculation (min) (ACUTE ONLY) 10 min   Received pt in bed having no complaints and agreeable to mobility. Assisted pt w/ spinal brace. Pt was asymptomatic throughout ambulation and returned to room w/o fault. Left in bed w/ call bell in reach and all needs met.   Inetta Manes Mobility Specialist  Please contact vis Secure Chat or  Rehab Office [phone]

## 2023-09-02 ENCOUNTER — Other Ambulatory Visit (HOSPITAL_COMMUNITY): Payer: Self-pay

## 2023-09-02 MED ORDER — KETOROLAC TROMETHAMINE 15 MG/ML IJ SOLN
15.0000 mg | Freq: Three times a day (TID) | INTRAMUSCULAR | Status: DC
  Administered 2023-09-02 – 2023-09-04 (×6): 15 mg via INTRAVENOUS
  Filled 2023-09-02 (×6): qty 1

## 2023-09-02 MED ORDER — LINEZOLID 600 MG PO TABS
600.0000 mg | ORAL_TABLET | Freq: Two times a day (BID) | ORAL | 0 refills | Status: AC
  Filled 2023-09-02: qty 28, 14d supply, fill #0

## 2023-09-02 NOTE — Progress Notes (Signed)
   Providing Compassionate, Quality Care - Together   Subjective: Patient reports persistent back pain. He denies any radicular symptoms.  Objective: Vital signs in last 24 hours: Temp:  [97.8 F (36.6 C)-98.3 F (36.8 C)] 98.3 F (36.8 C) (06/07 0741) Pulse Rate:  [86-103] 86 (06/07 0741) Resp:  [16-18] 16 (06/07 0741) BP: (82-125)/(47-80) 96/60 (06/07 0741) SpO2:  [97 %-99 %] 97 % (06/07 0741)  Intake/Output from previous day: No intake/output data recorded. Intake/Output this shift: Total I/O In: 3 [I.V.:3] Out: -   Alert and oriented x 4 PERRLA CN II-XII grossly intact MAE, Strength and sensation intact Incision closed with staples; Site is clean, dry, and intact   Lab Results: Recent Labs    09/01/23 1629  WBC 9.2  HGB 10.8*  HCT 33.8*  PLT 260   BMET Recent Labs    09/01/23 0644  CREATININE 1.22    Studies/Results: No results found.  Assessment/Plan: Patient underwent L4-5 PLIF by Dr. Rochelle Chu on 08/07/2023. He was admitted with postoperative lumbar wound infection on 08/29/2023. Taken to the OR for wound washout on 08/29/2023 by Dr. Nat Badger.   LOS: 4 days   -Will trial low dose of Toradol  to see if this improves patient's pain. -Patient is ready for discharge once pain better controlled.  I am in communication with my attending and they agree with the plan for this patient.   Henreitta Locus, DNP, AGNP-C Nurse Practitioner  [facility] [address], [address], [place], [place] [address] P: [phone]    F: [phone]  09/02/2023, 11:04 AM

## 2023-09-02 NOTE — Plan of Care (Signed)
  Problem: Education: Goal: Knowledge of General Education information will improve Description: Including pain rating scale, medication(s)/side effects and non-pharmacologic comfort measures Outcome: Progressing   Problem: Activity: Goal: Risk for activity intolerance will decrease Outcome: Progressing   Problem: Nutrition: Goal: Adequate nutrition will be maintained Outcome: Progressing   Problem: Coping: Goal: Level of anxiety will decrease Outcome: Progressing   Problem: Elimination: Goal: Will not experience complications related to bowel motility Outcome: Progressing   Problem: Pain Managment: Goal: General experience of comfort will improve and/or be controlled Outcome: Progressing

## 2023-09-02 NOTE — Progress Notes (Signed)
 Pt and friend whom is at bedside expressed concerns of allergic reactions to oral antibiotics that Pt will convert to upon D/C, They both stated he had a reaction while here this time. They also expressed concerns of pain management after D/C as well. Sent a message to the provider informing him of their concerns.

## 2023-09-03 DIAGNOSIS — T8140XA Infection following a procedure, unspecified, initial encounter: Secondary | ICD-10-CM | POA: Diagnosis not present

## 2023-09-03 DIAGNOSIS — B958 Unspecified staphylococcus as the cause of diseases classified elsewhere: Secondary | ICD-10-CM | POA: Diagnosis not present

## 2023-09-03 LAB — BASIC METABOLIC PANEL WITH GFR
Anion gap: 8 (ref 5–15)
BUN: 23 mg/dL (ref 8–23)
CO2: 24 mmol/L (ref 22–32)
Calcium: 8.7 mg/dL — ABNORMAL LOW (ref 8.9–10.3)
Chloride: 102 mmol/L (ref 98–111)
Creatinine, Ser: 1.13 mg/dL (ref 0.61–1.24)
GFR, Estimated: >60 mL/min (ref >60)
Glucose, Bld: 114 mg/dL — ABNORMAL HIGH (ref 70–99)
Potassium: 4.2 mmol/L (ref 3.5–5.1)
Sodium: 134 mmol/L — ABNORMAL LOW (ref 135–145)

## 2023-09-03 LAB — AEROBIC/ANAEROBIC CULTURE W GRAM STAIN (SURGICAL/DEEP WOUND)

## 2023-09-03 LAB — CBC WITH DIFFERENTIAL/PLATELET
Abs Immature Granulocytes: 0.03 10*3/uL (ref 0.00–0.07)
Basophils Absolute: 0.1 10*3/uL (ref 0.0–0.1)
Basophils Relative: 1 %
Eosinophils Absolute: 0.2 10*3/uL (ref 0.0–0.5)
Eosinophils Relative: 3 %
HCT: 32.4 % — ABNORMAL LOW (ref 39.0–52.0)
Hemoglobin: 10.6 g/dL — ABNORMAL LOW (ref 13.0–17.0)
Immature Granulocytes: 0 %
Lymphocytes Relative: 28 %
Lymphs Abs: 2.1 10*3/uL (ref 0.7–4.0)
MCH: 28.5 pg (ref 26.0–34.0)
MCHC: 32.7 g/dL (ref 30.0–36.0)
MCV: 87.1 fL (ref 80.0–100.0)
Monocytes Absolute: 0.6 10*3/uL (ref 0.1–1.0)
Monocytes Relative: 7 %
Neutro Abs: 4.5 10*3/uL (ref 1.7–7.7)
Neutrophils Relative %: 61 %
Platelets: 226 10*3/uL (ref 150–400)
RBC: 3.72 MIL/uL — ABNORMAL LOW (ref 4.22–5.81)
RDW: 13.2 % (ref 11.5–15.5)
WBC: 7.4 10*3/uL (ref 4.0–10.5)
nRBC: 0 % (ref 0.0–0.2)

## 2023-09-03 MED ORDER — KETOROLAC TROMETHAMINE 10 MG PO TABS: 10.0000 mg | ORAL_TABLET | Freq: Three times a day (TID) | ORAL | 0 refills | Status: AC

## 2023-09-03 MED ORDER — OXYCODONE HCL 5 MG PO TABS: 5.0000 mg | ORAL_TABLET | ORAL | 0 refills | Status: DC | PRN

## 2023-09-03 MED ORDER — METHOCARBAMOL 500 MG PO TABS: 500.0000 mg | ORAL_TABLET | Freq: Four times a day (QID) | ORAL | 1 refills | Status: DC | PRN

## 2023-09-03 NOTE — Progress Notes (Addendum)
 [facility]    Date of Admission:  08/29/2023   Total days of antibiotics 7          ID: [name] [name] is a 78 y.o. male with  s.lugdunensis superficial wound infection s/p debridement Principal Problem:   Postoperative wound breakdown    Subjective: Afebrile. Has better pain control  Medications:   colchicine   0.6 mg Oral BID   docusate sodium   100 mg Oral BID   finasteride   5 mg Oral QHS   furosemide   40 mg Oral Once per day on Monday Thursday   gabapentin   300 mg Oral TID   hydrALAZINE   25 mg Oral Q12H   ketorolac   15 mg Intravenous Q8H   multivitamin with minerals  1 tablet Oral Daily   pantoprazole   40 mg Oral QHS   rosuvastatin   5 mg Oral Q M,W,F   senna  1 tablet Oral BID   sodium chloride  flush  3 mL Intravenous Q12H   tamsulosin   0.4 mg Oral QHS    Objective: Vital signs in last 24 hours: Temp:  [97.6 F (36.4 C)-98.6 F (37 C)] 97.7 F (36.5 C) (06/08 0806) Pulse Rate:  [77-91] 77 (06/08 0806) Resp:  [16-17] 17 (06/08 0806) BP: (113-121)/(54-62) 113/54 (06/08 0806) SpO2:  [98 %-99 %] 98 % (06/08 0806)  Physical Exam  Constitutional: He is oriented to person, place, and time. He appears well-developed and well-nourished. No distress.  HENT:  Mouth/Throat: Oropharynx is clear and moist. No oropharyngeal exudate.  Cardiovascular: Normal rate, regular rhythm and normal heart sounds. Exam reveals no gallop and no friction rub.  No murmur heard.  Pulmonary/Chest: Effort normal and breath sounds normal. No respiratory distress. He has no wheezes.  Abdominal: Soft. Bowel sounds are normal. He exhibits no distension. There is no tenderness.  Lymphadenopathy:  He has no cervical adenopathy.  Neurological: He is alert and oriented to person, place, and time.  Skin: Skin is warm and dry. No rash noted. No erythema.  Psychiatric: He has a normal mood and affect. His behavior is normal.    Lab Results Recent Labs     09/01/23 0644 09/01/23 1629 09/03/23 0425  WBC  --  9.2  --   HGB  --  10.8*  --   HCT  --  33.8*  --   NA  --   --  134*  K  --   --  4.2  CL  --   --  102  CO2  --   --  24  BUN  --   --  23  CREATININE 1.22  --  1.13   Liver Panel No results for input(s): "PROT", "ALBUMIN ", "AST", "ALT", "ALKPHOS", "BILITOT", "BILIDIR", "IBILI" in the last 72 hours. Sedimentation Rate Recent Labs    09/01/23 1629  ESRSEDRATE 87*   C-Reactive Protein Recent Labs    09/01/23 1629  CRP 7.0*    Microbiology: 6/3 OR specimen Organism ID, Bacteria STAPHYLOCOCCUS LUGDUNENSIS  Resulting Agency [facility]     Susceptibility   Staphylococcus lugdunensis    MIC    CIPROFLOXACIN  Sensitive 1    CLINDAMYCIN  Sensitive 2    ERYTHROMYCIN  Sensitive 3    GENTAMICIN   Sensitive 4    Inducible Clindamycin NEGATIVE Sensitive    OXACILLIN  Resistant 5    RIFAMPIN  Sensitive 6    TETRACYCLINE  Sensitive 7    TRIMETH/SULFA  Sensitive 8  VANCOMYCIN   Sensitive 9      Studies/Results: No results found.   Assessment/Plan: S.lugdunensis superficial SSI/wound dehiscence s/p debridement = currently on linezolid . Recommend to discharge on 14 days of linezolid  600 mg po BID. Will check CBC to see that he is still tolerating abtx  We will see back in the ID clinic in 10 days to discuss if need to extend other abtx such as doxy or bactrim. On June 18th at 11:30  Recommend that he stays one more night to ensure better back pain control.  I have personally spent 30 minutes involved in face-to-face and non-face-to-face activities for this patient on the day of the visit. Professional time spent includes the following activities: Preparing to see the patient (review of tests), Performing a medically appropriate examination and/or evaluation , Ordering medications/tests/procedures, referring and communicating with other health care professionals, Documenting clinical information in the EMR, Independently  interpreting results (not separately reported), Communicating results to the patient/and family.   [facility] Pager: [phone]  09/03/2023, 11:56 AM

## 2023-09-03 NOTE — Plan of Care (Signed)
  Problem: Health Behavior/Discharge Planning: Goal: Ability to manage health-related needs will improve Outcome: Progressing   Problem: Clinical Measurements: Goal: Will remain free from infection Outcome: Progressing   Problem: Activity: Goal: Risk for activity intolerance will decrease Outcome: Progressing   Problem: Nutrition: Goal: Adequate nutrition will be maintained Outcome: Progressing   Problem: Pain Managment: Goal: General experience of comfort will improve and/or be controlled Outcome: Progressing   Problem: Safety: Goal: Ability to remain free from injury will improve Outcome: Progressing   Problem: Skin Integrity: Goal: Risk for impaired skin integrity will decrease Outcome: Progressing

## 2023-09-03 NOTE — Progress Notes (Signed)
   Providing Compassionate, Quality Care - Together   Subjective: Patient reports his back pain is much improved from yesterday.  Objective: Vital signs in last 24 hours: Temp:  [97.6 F (36.4 C)-98.6 F (37 C)] 97.7 F (36.5 C) (06/08 0806) Pulse Rate:  [77-91] 77 (06/08 0806) Resp:  [16-17] 17 (06/08 0806) BP: (113-121)/(54-62) 113/54 (06/08 0806) SpO2:  [98 %-99 %] 98 % (06/08 0806)  Intake/Output from previous day: 06/07 0701 - 06/08 0700 In: 2077.4 [P.O.:474; I.V.:3; IV Piggyback:1600.4] Out: -  Intake/Output this shift: Total I/O In: 240 [P.O.:240] Out: -   Alert and oriented x 4 PERRLA CN II-XII grossly intact MAE, Strength and sensation intact Incision closed with staples; Site is clean, dry, and intact  Lab Results: Recent Labs    09/01/23 1629 09/03/23 1218  WBC 9.2 7.4  HGB 10.8* 10.6*  HCT 33.8* 32.4*  PLT 260 226   BMET Recent Labs    09/01/23 0644 09/03/23 0425  NA  --  134*  K  --  4.2  CL  --  102  CO2  --  24  GLUCOSE  --  114*  BUN  --  23  CREATININE 1.22 1.13  CALCIUM   --  8.7*    Studies/Results: No results found.  Assessment/Plan: Patient underwent L4-5 PLIF by Dr. Rochelle Chu on 08/07/2023. He was admitted with postoperative lumbar wound infection on 08/29/2023. Taken to the OR for wound washout on 08/29/2023 by Dr. Nat Badger.   LOS: 5 days   -Per Dr. Alva Auer recommendation, will keep patient one more night and discharge home tomorrow. -Discharge medications sent to the patient's external pharmacy.  I am in communication with my attending and they agree with the plan for this patient.   Henreitta Locus, DNP, AGNP-C Nurse Practitioner  [facility] [address], [address], [place], [place] [address] P: [phone]    F: [phone]  09/03/2023, 1:28 PM

## 2023-09-03 NOTE — Plan of Care (Signed)
  Problem: Education: Goal: Knowledge of General Education information will improve Description: Including pain rating scale, medication(s)/side effects and non-pharmacologic comfort measures Outcome: Progressing   Problem: Health Behavior/Discharge Planning: Goal: Ability to manage health-related needs will improve Outcome: Progressing   Problem: Clinical Measurements: Goal: Ability to maintain clinical measurements within normal limits will improve Outcome: Progressing Goal: Will remain free from infection Outcome: Progressing Goal: Diagnostic test results will improve Outcome: Progressing Goal: Respiratory complications will improve Outcome: Progressing Goal: Cardiovascular complication will be avoided Outcome: Progressing   Problem: Activity: Goal: Risk for activity intolerance will decrease Outcome: Progressing   Problem: Nutrition: Goal: Adequate nutrition will be maintained Outcome: Progressing   Problem: Safety: Goal: Ability to remain free from injury will improve Outcome: Progressing   Problem: Skin Integrity: Goal: Risk for impaired skin integrity will decrease Outcome: Progressing   Problem: Education: Goal: Ability to verbalize activity precautions or restrictions will improve Outcome: Progressing Goal: Knowledge of the prescribed therapeutic regimen will improve Outcome: Progressing Goal: Understanding of discharge needs will improve Outcome: Progressing   Problem: Activity: Goal: Ability to avoid complications of mobility impairment will improve Outcome: Progressing Goal: Ability to tolerate increased activity will improve Outcome: Progressing Goal: Will remain free from falls Outcome: Progressing

## 2023-09-04 LAB — GLUCOSE, CAPILLARY: Glucose-Capillary: 180 mg/dL — ABNORMAL HIGH (ref 70–99)

## 2023-09-04 NOTE — Progress Notes (Signed)
 Mobility Specialist Progress Note:    09/04/23 1000  Mobility  Activity Ambulated with assistance in hallway  Level of Assistance Standby assist, set-up cues, supervision of patient - no hands on  Assistive Device None;Other (Comment) (Handrails)  Distance Ambulated (ft) 550 ft  Activity Response Tolerated well  Mobility Referral Yes  Mobility visit 1 Mobility  Mobility Specialist Start Time (ACUTE ONLY) 0930  Mobility Specialist Stop Time (ACUTE ONLY) 0940  Mobility Specialist Time Calculation (min) (ACUTE ONLY) 10 min   Pt received in chair, agreeable to mobility. Pt had back brace on prior to session. Pt occasionally grazed handrails. No c/o throughout ambulation. Returned to chair w/o fault. LPN in room. Personal belongings and call bell in reach. All needs met.   Inetta Manes Mobility Specialist  Please contact vis Secure Chat or  Rehab Office [phone]

## 2023-09-04 NOTE — Discharge Summary (Signed)
 Physician Discharge Summary  Patient ID: [name] [name] MRN: [id number] DOB/AGE: November 25, 1945 78 y.o.  Admit date: 08/29/2023 Discharge date: 09/04/2023  Admission Diagnoses: lumbar wound infection    Discharge Diagnoses: same   Discharged Condition: good  Hospital Course: The patient was admitted on 08/29/2023 and taken to the operating room where the patient underwent lumbar wound I&D. The patient tolerated the procedure well and was taken to the recovery room and then to the floor in stable condition. The hospital course was routine. There were no complications. The wound remained clean dry and intact. Pt had appropriate back soreness. No complaints of leg pain or new N/T/W. The patient remained afebrile with stable vital signs, and tolerated a regular diet. The patient continued to increase activities, and pain was well controlled with oral pain medications.   Consults: None  Significant Diagnostic Studies:  Results for orders placed or performed during the hospital encounter of 08/29/23  Type and screen [facility]   Collection Time: 08/29/23  1:48 PM  Result Value Ref Range   ABO/RH(D) A POS    Antibody Screen NEG    Sample Expiration      09/01/2023,2359 Performed at [facility], [address] [place], [place] [address]   Merrilee Abernethy [address]   Collection Time: 08/29/23  1:57 PM  Result Value Ref Range   Sodium 137 135 - 145 mmol/L   Potassium 4.4 3.5 - 5.1 mmol/L   Chloride 102 98 - 111 mmol/L   BUN 24 (H) 8 - 23 mg/dL   Creatinine, Ser 6.57 0.61 - 1.24 mg/dL   Glucose, Bld 846 (H) 70 - 99 mg/dL   Calcium , Ion 1.20 1.15 - 1.40 mmol/L   TCO2 24 22 - 32 mmol/L   Hemoglobin 11.6 (L) 13.0 - 17.0 g/dL   HCT 96.2 (L) 95.2 - 84.1 %  Aerobic/Anaerobic Culture w Gram Stain (surgical/deep wound)   Collection Time: 08/29/23  3:46 PM   Specimen: Soft Tissue, Other  Result Value Ref Range   Specimen Description WOUND    Special Requests LUMBAR PT ON  CEFAZOLIN     Gram Stain      FEW WBC PRESENT, PREDOMINANTLY PMN NO ORGANISMS SEEN    Culture      FEW STAPHYLOCOCCUS LUGDUNENSIS NO ANAEROBES ISOLATED Performed at [facility], [address] [place], [place] [address]    Report Status 09/03/2023 FINAL    Organism ID, Bacteria STAPHYLOCOCCUS LUGDUNENSIS       Susceptibility   Staphylococcus lugdunensis - MIC*    CIPROFLOXACIN Value in next row Sensitive      SENSITIVE<=0.5    ERYTHROMYCIN Value in next row Sensitive      SENSITIVE<=0.25    GENTAMICIN  Value in next row Sensitive      SENSITIVE<=0.5    OXACILLIN Value in next row Resistant      RESISTANT>=4    TETRACYCLINE Value in next row Sensitive      SENSITIVE<=1    VANCOMYCIN  Value in next row Sensitive      SENSITIVE<=0.5    TRIMETH/SULFA Value in next row Sensitive      SENSITIVE<=10    CLINDAMYCIN Value in next row Sensitive      SENSITIVE<=0.25    RIFAMPIN Value in next row Sensitive      SENSITIVE<=0.5    Inducible Clindamycin Value in next row Sensitive      SENSITIVE<=0.5    * FEW STAPHYLOCOCCUS LUGDUNENSIS  CBC   Collection  Time: 08/30/23  9:05 AM  Result Value Ref Range   WBC 10.2 4.0 - 10.5 K/uL   RBC 3.42 (L) 4.22 - 5.81 MIL/uL   Hemoglobin 9.7 (L) 13.0 - 17.0 g/dL   HCT 02.7 (L) 25.3 - 66.4 %   MCV 89.2 80.0 - 100.0 fL   MCH 28.4 26.0 - 34.0 pg   MCHC 31.8 30.0 - 36.0 g/dL   RDW 40.3 47.4 - 25.9 %   Platelets 298 150 - 400 K/uL   nRBC 0.0 0.0 - 0.2 %  Creatinine, serum   Collection Time: 09/01/23  6:44 AM  Result Value Ref Range   Creatinine, Ser 1.22 0.61 - 1.24 mg/dL   GFR, Estimated >56 >38 mL/min  Sedimentation rate   Collection Time: 09/01/23  4:29 PM  Result Value Ref Range   Sed Rate 87 (H) 0 - 16 mm/hr  C-reactive protein   Collection Time: 09/01/23  4:29 PM  Result Value Ref Range   CRP 7.0 (H) <1.0 mg/dL  CBC with Differential/Platelet   Collection Time: 09/01/23  4:29 PM  Result Value Ref Range   WBC 9.2 4.0 - 10.5  K/uL   RBC 3.81 (L) 4.22 - 5.81 MIL/uL   Hemoglobin 10.8 (L) 13.0 - 17.0 g/dL   HCT 75.6 (L) 43.3 - 29.5 %   MCV 88.7 80.0 - 100.0 fL   MCH 28.3 26.0 - 34.0 pg   MCHC 32.0 30.0 - 36.0 g/dL   RDW 18.8 41.6 - 60.6 %   Platelets 260 150 - 400 K/uL   nRBC 0.0 0.0 - 0.2 %   Neutrophils Relative % 67 %   Neutro Abs 6.2 1.7 - 7.7 K/uL   Lymphocytes Relative 23 %   Lymphs Abs 2.2 0.7 - 4.0 K/uL   Monocytes Relative 9 %   Monocytes Absolute 0.8 0.1 - 1.0 K/uL   Eosinophils Relative 0 %   Eosinophils Absolute 0.0 0.0 - 0.5 K/uL   Basophils Relative 1 %   Basophils Absolute 0.1 0.0 - 0.1 K/uL   Immature Granulocytes 0 %   Abs Immature Granulocytes 0.04 0.00 - 0.07 K/uL  Basic metabolic panel   Collection Time: 09/03/23  4:25 AM  Result Value Ref Range   Sodium 134 (L) 135 - 145 mmol/L   Potassium 4.2 3.5 - 5.1 mmol/L   Chloride 102 98 - 111 mmol/L   CO2 24 22 - 32 mmol/L   Glucose, Bld 114 (H) 70 - 99 mg/dL   BUN 23 8 - 23 mg/dL   Creatinine, Ser 3.01 0.61 - 1.24 mg/dL   Calcium  8.7 (L) 8.9 - 10.3 mg/dL   GFR, Estimated >60 >10 mL/min   Anion gap 8 5 - 15  CBC with Differential/Platelet   Collection Time: 09/03/23 12:18 PM  Result Value Ref Range   WBC 7.4 4.0 - 10.5 K/uL   RBC 3.72 (L) 4.22 - 5.81 MIL/uL   Hemoglobin 10.6 (L) 13.0 - 17.0 g/dL   HCT 93.2 (L) 35.5 - 73.2 %   MCV 87.1 80.0 - 100.0 fL   MCH 28.5 26.0 - 34.0 pg   MCHC 32.7 30.0 - 36.0 g/dL   RDW 20.2 54.2 - 70.6 %   Platelets 226 150 - 400 K/uL   nRBC 0.0 0.0 - 0.2 %   Neutrophils Relative % 61 %   Neutro Abs 4.5 1.7 - 7.7 K/uL   Lymphocytes Relative 28 %   Lymphs Abs 2.1 0.7 - 4.0 K/uL  Monocytes Relative 7 %   Monocytes Absolute 0.6 0.1 - 1.0 K/uL   Eosinophils Relative 3 %   Eosinophils Absolute 0.2 0.0 - 0.5 K/uL   Basophils Relative 1 %   Basophils Absolute 0.1 0.0 - 0.1 K/uL   Immature Granulocytes 0 %   Abs Immature Granulocytes 0.03 0.00 - 0.07 K/uL  Glucose, capillary   Collection Time:  09/04/23  7:53 AM  Result Value Ref Range   Glucose-Capillary 180 (H) 70 - 99 mg/dL    DG Lumbar Spine 2-3 Views Result Date: 08/07/2023 CLINICAL DATA:  L4-5 laminectomy intraoperative EXAM: LUMBAR SPINE - 2-3 VIEW COMPARISON:  05/24/2023 FINDINGS: Intraoperative images of the lumbar spine demonstrate bilateral posterolateral rod and pedicle screw fixation at L4-5 with interbody spacer. No complicating feature observed. IMPRESSION: 1. Intraoperative images of the lumbar spine demonstrate bilateral posterolateral rod and pedicle screw fixation at L4-5 with interbody spacer. Electronically Signed   By: Freida Jes M.D.   On: 08/07/2023 14:18   DG C-Arm 1-60 Min-No Report Result Date: 08/07/2023 Fluoroscopy was utilized by the requesting physician.  No radiographic interpretation.   DG C-Arm 1-60 Min-No Report Result Date: 08/07/2023 Fluoroscopy was utilized by the requesting physician.  No radiographic interpretation.   DG C-Arm 1-60 Min-No Report Result Date: 08/07/2023 Fluoroscopy was utilized by the requesting physician.  No radiographic interpretation.    Antibiotics:  Anti-infectives (From admission, onward)    Start     Dose/Rate Route Frequency Ordered Stop   09/02/23 0000  linezolid  (ZYVOX ) 600 MG tablet        600 mg Oral 2 times daily 09/02/23 1141 09/16/23 2359   08/30/23 2000  Vancomycin  (VANCOCIN ) 1,250 mg in sodium chloride  0.9 % 250 mL IVPB  Status:  Discontinued        1,250 mg 166.7 mL/hr over 90 Minutes Intravenous Every 24 hours 08/29/23 1843 08/29/23 2200   08/30/23 1600  ceFEPIme  (MAXIPIME ) 2 g in sodium chloride  0.9 % 100 mL IVPB  Status:  Discontinued        2 g 200 mL/hr over 30 Minutes Intravenous Every 12 hours 08/30/23 0735 09/04/23 0708   08/30/23 0600  ceFAZolin  (ANCEF ) IVPB 2g/100 mL premix        2 g 200 mL/hr over 30 Minutes Intravenous On call to O.R. 08/29/23 1315 08/29/23 1547   08/29/23 2300  linezolid  (ZYVOX ) IVPB 600 mg        600 mg 300  mL/hr over 60 Minutes Intravenous Every 12 hours 08/29/23 2200     08/29/23 2000  ceFEPIme  (MAXIPIME ) 2 g in sodium chloride  0.9 % 100 mL IVPB  Status:  Discontinued        2 g 200 mL/hr over 30 Minutes Intravenous Every 8 hours 08/29/23 1843 08/30/23 0735   08/29/23 1930  vancomycin  (VANCOREADY) IVPB 1750 mg/350 mL        1,750 mg 175 mL/hr over 120 Minutes Intravenous  Once 08/29/23 1843 08/29/23 2111   08/29/23 1900  ceFAZolin  (ANCEF ) IVPB 2g/100 mL premix  Status:  Discontinued        2 g 200 mL/hr over 30 Minutes Intravenous Every 8 hours 08/29/23 1804 08/29/23 1843   08/29/23 1322  ceFAZolin  (ANCEF ) 2-4 GM/100ML-% IVPB  Status:  Discontinued       Note to Pharmacy: Catherine Cloud: cabinet override      08/29/23 1322 08/29/23 1402       Discharge Exam: Blood pressure 123/65, pulse 92, temperature 97.7 F (36.5  C), temperature source Oral, resp. rate 18, height 5\' 8"  (1.727 m), weight 87.5 kg, SpO2 97%. Neurologic: Grossly normal Ambualting and voiding well incision cdi   Discharge Medications:   Allergies as of 09/04/2023       Reactions   Nitrostat [nitroglycerin] Anaphylaxis   "Cardiologist suggested he shouldn't take this med because it could kill him with his heart condition. Lowers BP"   Vancomycin  Hives   08/29/23 per confirmation with RN, shortly after starting vancomycin  infusion patient developed itching and a rash on his torso   Ace Inhibitors Swelling   Mevacor  [lovastatin ] Nausea Only   Solarcaine [benzocaine] Dermatitis        Medication List     STOP taking these medications    doxycycline  100 MG capsule Commonly known as: VIBRAMYCIN    HYDROcodone -acetaminophen  10-325 MG tablet Commonly known as: NORCO   ibuprofen  200 MG tablet Commonly known as: ADVIL    naproxen  sodium 220 MG tablet Commonly known as: ALEVE        TAKE these medications    acetaminophen  500 MG tablet Commonly known as: TYLENOL  Take 1,000 mg by mouth every 6 (six)  hours as needed for headache.   aspirin  EC 81 MG tablet Take 1 tablet (81 mg total) by mouth daily. What changed: when to take this   colchicine  0.6 MG tablet TAKE 1 TABLET TWICE DAILY What changed:  when to take this reasons to take this   DULCOLAX PO Take 2 tablets by mouth 2 (two) times daily as needed.   finasteride  5 MG tablet Commonly known as: PROSCAR  Take 5 mg by mouth at bedtime.   FISH OIL PO Take 1,000 mg by mouth in the morning.   furosemide  40 MG tablet Commonly known as: LASIX  TAKE 1 TABLET TWICE DAILY What changed: when to take this   gabapentin  300 MG capsule Commonly known as: NEURONTIN  Take 1-2 capsules (300-600 mg total) by mouth 3 (three) times daily as needed (pain). What changed:  how much to take when to take this   hydrALAZINE  25 MG tablet Commonly known as: APRESOLINE  Take 1 tablet (25 mg total) by mouth in the morning and at bedtime. What changed: how much to take   ketorolac  10 MG tablet Commonly known as: TORADOL  Take 1 tablet (10 mg total) by mouth every 8 (eight) hours for 2 days.   linezolid  600 MG tablet Commonly known as: ZYVOX  Take 1 tablet (600 mg total) by mouth 2 (two) times daily for 14 days.   MAGNESIUM  PO Take 400 mg by mouth in the morning.   methocarbamol  500 MG tablet Commonly known as: ROBAXIN  Take 1 tablet (500 mg total) by mouth every 6 (six) hours as needed for muscle spasms.   multivitamin with minerals Tabs tablet Take 1 tablet by mouth in the morning. One-A-Day Men Health Multivitamin   oxyCODONE  5 MG immediate release tablet Commonly known as: Oxy IR/ROXICODONE  Take 1-2 tablets (5-10 mg total) by mouth every 4 (four) hours as needed for moderate pain (pain score 4-6) or severe pain (pain score 7-10).   QC TUMERIC COMPLEX PO Take 1 tablet by mouth in the morning.   rosuvastatin  5 MG tablet Commonly known as: CRESTOR  Take 1 tablet (5 mg total) by mouth every Monday, Wednesday, and Friday. What changed:  additional instructions   tamsulosin  0.4 MG Caps capsule Commonly known as: FLOMAX  TAKE 1 CAPSULE EVERY DAY What changed: when to take this   VITAMIN B-12 PO Take 1,000 mcg by mouth in  the morning.   zinc  sulfate (50mg  elemental zinc ) 220 (50 Zn) MG capsule Take 220 mg by mouth in the morning.        Disposition: home   Final Dx: lumbar wound I&D  Discharge Instructions     Call MD for:  difficulty breathing, headache or visual disturbances   Complete by: As directed    Call MD for:  hives   Complete by: As directed    Call MD for:  persistant nausea and vomiting   Complete by: As directed    Call MD for:  redness, tenderness, or signs of infection (pain, swelling, redness, odor or green/yellow discharge around incision site)   Complete by: As directed    Call MD for:  severe uncontrolled pain   Complete by: As directed    Call MD for:  temperature >100.4   Complete by: As directed    Diet - low sodium heart healthy   Complete by: As directed    Driving Restrictions   Complete by: As directed    No driving for 2 weeks, no riding in the car for 1 week   Incentive spirometry RT   Complete by: As directed    Increase activity slowly   Complete by: As directed    Lifting restrictions   Complete by: As directed    No lifting more than 8 lbs          Signed: Kenard Paul Takuma Cifelli 09/04/2023, 12:22 PM

## 2023-09-04 NOTE — Progress Notes (Signed)
 [name] [name] to be D/C'd  per MD order.  Discussed with the patient and all questions fully answered.  VSS, Skin clean, dry and intact without evidence of skin break down, no evidence of skin tears noted.  IV catheter discontinued intact. Site without signs and symptoms of complications. Dressing and pressure applied.  An After Visit Summary was printed and given to the patient.   D/c education completed with patient/family including follow up instructions, medication list, d/c activities limitations if indicated, with other d/c instructions as indicated by MD - patient able to verbalize understanding, all questions fully answered.   Patient instructed to return to ED, call 911, or call MD for any changes in condition.   Patient to be escorted via WC to the discharge lounge.

## 2023-09-05 ENCOUNTER — Telehealth: Payer: Self-pay | Admitting: *Deleted

## 2023-09-05 NOTE — Transitions of Care (Post Inpatient/ED Visit) (Signed)
   09/05/2023  Name: Nomar Broad MRN: [id number] DOB: March 31, 1945  Today's TOC FU Call Status: Today's TOC FU Call Status:: Unsuccessful Call (1st Attempt) Unsuccessful Call (1st Attempt) Date: 09/05/23  Attempted to reach the patient regarding the most recent Inpatient/ED visit.  Follow Up Plan: Additional outreach attempts will be made to reach the patient to complete the Transitions of Care (Post Inpatient/ED visit) call.   Una Ganser BSN RN [facility] Care Management Coordinator [email or web address] Direct Dial: [phone]  Fax: [phone] Website: [email or web address]

## 2023-09-06 ENCOUNTER — Telehealth: Payer: Self-pay

## 2023-09-06 NOTE — Transitions of Care (Post Inpatient/ED Visit) (Signed)
   09/06/2023  Name: [name] [name] MRN: [id number] DOB: [birth year]  Today's TOC FU Call Status: Today's TOC FU Call Status:: Unsuccessful Call (2nd Attempt) Unsuccessful Call (2nd Attempt) Date: 09/06/23  Attempted to reach the patient regarding the most recent Inpatient/ED visit.  Follow Up Plan: Additional outreach attempts will be made to reach the patient to complete the Transitions of Care (Post Inpatient/ED visit) call.   Suhayla Chisom J. Khaleb Broz RN, MSN [facility], [facility] RN Care Manager Direct Dial: [phone]  Fax: [phone] Website: [email or web address]

## 2023-09-07 ENCOUNTER — Telehealth: Payer: Self-pay

## 2023-09-07 NOTE — Progress Notes (Signed)
 Remote ICD transmission.

## 2023-09-07 NOTE — Transitions of Care (Post Inpatient/ED Visit) (Signed)
   09/07/2023  Name: Easten Maceachern MRN: [id number] DOB: 1945-05-26  Today's TOC FU Call Status: Today's TOC FU Call Status:: Unsuccessful Call (3rd Attempt) Unsuccessful Call (3rd Attempt) Date: 09/07/23  Attempted to reach the patient regarding the most recent Inpatient/ED visit.  Follow Up Plan: No further outreach attempts will be made at this time. We have been unable to contact the patient.  Pang Robers J. Virjean Boman RN, MSN [facility], [facility] RN Care Manager Direct Dial: [phone]  Fax: [phone] Website: [email or web address]

## 2023-09-13 ENCOUNTER — Encounter: Payer: Self-pay | Admitting: Internal Medicine

## 2023-09-13 ENCOUNTER — Other Ambulatory Visit: Payer: Self-pay

## 2023-09-13 ENCOUNTER — Ambulatory Visit: Payer: Self-pay | Admitting: Internal Medicine

## 2023-09-13 VITALS — BP 112/71 | HR 113 | Resp 16 | Ht 68.0 in | Wt 186.0 lb

## 2023-09-13 DIAGNOSIS — T8149XA Infection following a procedure, other surgical site, initial encounter: Secondary | ICD-10-CM | POA: Diagnosis not present

## 2023-09-13 DIAGNOSIS — B957 Other staphylococcus as the cause of diseases classified elsewhere: Secondary | ICD-10-CM

## 2023-09-13 DIAGNOSIS — Z79899 Other long term (current) drug therapy: Secondary | ICD-10-CM | POA: Diagnosis not present

## 2023-09-13 MED ORDER — DOXYCYCLINE HYCLATE 100 MG PO CAPS: 100.0000 mg | ORAL_CAPSULE | Freq: Two times a day (BID) | ORAL | 0 refills | Status: DC

## 2023-09-13 NOTE — Progress Notes (Signed)
 RFV: follow up for hospitalization  Patient ID: [name] [name], male   DOB: Dec 29, 1945, 78 y.o.   MRN: [id number]  HPI [name] [name] isa 19bn M with lumbar fusion S.lugdunensis superficial SSI/wound dehiscence s/p debridement, who is currently on linezolid  and was discharge on 14 days of linezolid  600 mg po BID to finish on June 22nd. He is here for follow up to see that he is tolerating abtx thus far. No purulent drainage from incision. Slight serous fluid on bandage  Outpatient Encounter Medications as of 09/13/2023  Medication Sig   acetaminophen  (TYLENOL ) 500 MG tablet Take 1,000 mg by mouth every 6 (six) hours as needed for headache.   aspirin  EC 81 MG tablet Take 1 tablet (81 mg total) by mouth daily. (Patient taking differently: Take 81 mg by mouth in the morning.)   colchicine  0.6 MG tablet TAKE 1 TABLET TWICE DAILY (Patient taking differently: Take 0.6 mg by mouth as needed.)   Cyanocobalamin (VITAMIN B-12 PO) Take 1,000 mcg by mouth in the morning.   finasteride  (PROSCAR ) 5 MG tablet Take 5 mg by mouth at bedtime.   furosemide  (LASIX ) 40 MG tablet TAKE 1 TABLET TWICE DAILY (Patient taking differently: Take 40 mg by mouth every other day.)   gabapentin  (NEURONTIN ) 300 MG capsule Take 1-2 capsules (300-600 mg total) by mouth 3 (three) times daily as needed (pain). (Patient taking differently: Take 600 mg by mouth 2 (two) times daily.)   hydrALAZINE  (APRESOLINE ) 25 MG tablet Take 1 tablet (25 mg total) by mouth in the morning and at bedtime. (Patient taking differently: Take 50 mg by mouth in the morning and at bedtime.)   linezolid  (ZYVOX ) 600 MG tablet Take 1 tablet (600 mg total) by mouth 2 (two) times daily for 14 days.   Magnesium  Hydroxide (DULCOLAX PO) Take 2 tablets by mouth 2 (two) times daily as needed.   MAGNESIUM  PO Take 400 mg by mouth in the morning.   methocarbamol  (ROBAXIN ) 500 MG tablet Take 1 tablet (500 mg total) by mouth every 6 (six) hours as needed for muscle  spasms.   Multiple Vitamin (MULTIVITAMIN WITH MINERALS) TABS tablet Take 1 tablet by mouth in the morning. One-A-Day Men Health Multivitamin   Omega-3 Fatty Acids (FISH OIL PO) Take 1,000 mg by mouth in the morning.   oxyCODONE  (OXY IR/ROXICODONE ) 5 MG immediate release tablet Take 1-2 tablets (5-10 mg total) by mouth every 4 (four) hours as needed for moderate pain (pain score 4-6) or severe pain (pain score 7-10).   rosuvastatin  (CRESTOR ) 5 MG tablet Take 1 tablet (5 mg total) by mouth every Monday, Wednesday, and Friday. (Patient taking differently: Take 5 mg by mouth every Monday, Wednesday, and Friday. At night)   tamsulosin  (FLOMAX ) 0.4 MG CAPS capsule TAKE 1 CAPSULE EVERY DAY (Patient taking differently: Take 0.4 mg by mouth at bedtime.)   Turmeric (QC TUMERIC COMPLEX PO) Take 1 tablet by mouth in the morning.   zinc  sulfate, 50mg  elemental zinc , 220 (50 Zn) MG capsule Take 220 mg by mouth in the morning.   No facility-administered encounter medications on file as of 09/13/2023.     Patient Active Problem List   Diagnosis Date Noted   Postoperative wound breakdown 08/29/2023   S/P lumbar fusion 08/07/2023   Foreign body in right ear 06/09/2023   Impacted cerumen of left ear 06/09/2023   Fatigue 08/17/2022   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   Hx of colonic polyps 01/10/2022   Atrial  fibrillation, transient (HCC) 11/02/2021   Hypotension    Chest pain 10/28/2021   Prostate cancer (HCC) 04/02/2021   Chronic HFrEF (heart failure with reduced ejection fraction) (HCC) 05/20/2020   Claudication (HCC) 09/01/2019   S/P cervical spinal fusion 04/20/2018   Paresthesia 01/10/2018   Neck pain 01/10/2018   LBBB (left bundle branch block) 09/07/2017   Other meniscus derangements, posterior horn of medial meniscus, left knee, insufficency fracture medial tibial plateau 04/07/2017   Closed nondisplaced fracture of lateral condyle of left tibia with routine healing  04/07/2017   Status post arthroscopy of left knee 04/07/2017   Insomnia 09/14/2015   Hyperlipidemia with target LDL less than 100 09/11/2014   Rotator cuff tendinitis 04/28/2014   Obesity (BMI 30-39.9) 12/29/2013   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   Exertional shortness of breath 06/26/2012   Polyp of colon, adenomatous 04/27/2012   Elevated PSA 10/20/2011   Hypertriglyceridemia 07/08/2011   Nonischemic cardiomyopathy (HCC) 09/04/2009     Health Maintenance Due  Topic Date Due   Zoster Vaccines- Shingrix (2 of 2) 07/28/2021   COVID-19 Vaccine (6 - 2024-25 season) 11/27/2022     Review of Systems Still has some back pain. 12 point ros is otherwise negative Physical Exam   Ht 5' 8 (1.727 m)   Wt 193 lb (87.5 kg)   BMI 29.35 kg/m    Physical Exam  Constitutional: He is oriented to person, place, and time. He appears well-developed and well-nourished. No distress.  HENT:  Mouth/Throat: Oropharynx is clear and moist. No oropharyngeal exudate.  Cardiovascular: Normal rate, regular rhythm and normal heart sounds. Exam reveals no gallop and no friction rub.  No murmur heard.  Pulmonary/Chest: Effort normal and breath sounds normal. No respiratory distress. He has no wheezes.  Skin= staples in place, incision healing well approximated.  Neurological: He is alert and oriented to person, place, and time.  Skin: Skin is warm and dry. No rash noted. No erythema.  Psychiatric: He has a normal mood and affect. His behavior is normal.    CBC Lab Results  Component Value Date   WBC 7.4 09/03/2023   RBC 3.72 (L) 09/03/2023   HGB 10.6 (L) 09/03/2023   HCT 32.4 (L) 09/03/2023   PLT 226 09/03/2023   MCV 87.1 09/03/2023   MCH 28.5 09/03/2023   MCHC 32.7 09/03/2023   RDW 13.2 09/03/2023   LYMPHSABS 2.1 09/03/2023   MONOABS 0.6 09/03/2023   EOSABS 0.2 09/03/2023    BMET Lab Results  Component Value Date   NA 134 (L) 09/03/2023   K 4.2 09/03/2023   CL 102  09/03/2023   CO2 24 09/03/2023   GLUCOSE 114 (H) 09/03/2023   BUN 23 09/03/2023   CREATININE 1.13 09/03/2023   CALCIUM  8.7 (L) 09/03/2023   GFRNONAA >60 09/03/2023   GFRAA 83 01/08/2020    Lab Results  Component Value Date   ESRSEDRATE 82 (H) 09/13/2023   POCTSEDRATE 26 (A) 10/03/2011     Assessment and Plan  S.lugdunensis deep wound infection related to recent fusion = plan to transition to 4 wk of doxycycline  and finish linezolid .  Getting staples removed on 6/24   Will see back in at end of 4 wk to see if needs extended course

## 2023-09-14 LAB — CBC WITH DIFFERENTIAL/PLATELET
Absolute Lymphocytes: 2064 {cells}/uL (ref 850–3900)
Absolute Monocytes: 362 {cells}/uL (ref 200–950)
Basophils Absolute: 46 {cells}/uL (ref 0–200)
Basophils Relative: 0.6 %
Eosinophils Absolute: 231 {cells}/uL (ref 15–500)
Eosinophils Relative: 3 %
HCT: 32.6 % — ABNORMAL LOW (ref 38.5–50.0)
Hemoglobin: 10.3 g/dL — ABNORMAL LOW (ref 13.2–17.1)
MCH: 28.2 pg (ref 27.0–33.0)
MCHC: 31.6 g/dL — ABNORMAL LOW (ref 32.0–36.0)
MCV: 89.3 fL (ref 80.0–100.0)
MPV: 11.2 fL (ref 7.5–12.5)
Monocytes Relative: 4.7 %
Neutro Abs: 4997 {cells}/uL (ref 1500–7800)
Neutrophils Relative %: 64.9 %
Platelets: 159 10*3/uL (ref 140–400)
RBC: 3.65 10*6/uL — ABNORMAL LOW (ref 4.20–5.80)
RDW: 13.3 % (ref 11.0–15.0)
Total Lymphocyte: 26.8 %
WBC: 7.7 10*3/uL (ref 3.8–10.8)

## 2023-09-14 LAB — BASIC METABOLIC PANEL WITH GFR
BUN/Creatinine Ratio: 15 (calc) (ref 6–22)
BUN: 21 mg/dL (ref 7–25)
CO2: 26 mmol/L (ref 20–32)
Calcium: 9.6 mg/dL (ref 8.6–10.3)
Chloride: 104 mmol/L (ref 98–110)
Creat: 1.38 mg/dL — ABNORMAL HIGH (ref 0.70–1.28)
Glucose, Bld: 110 mg/dL — ABNORMAL HIGH (ref 65–99)
Potassium: 4.4 mmol/L (ref 3.5–5.3)
Sodium: 140 mmol/L (ref 135–146)
eGFR: 52 mL/min/{1.73_m2} — ABNORMAL LOW (ref >=60)

## 2023-09-14 LAB — C-REACTIVE PROTEIN: CRP: 23.9 mg/L — ABNORMAL HIGH (ref <8.0)

## 2023-09-14 LAB — SEDIMENTATION RATE: Sed Rate: 82 mm/h — ABNORMAL HIGH (ref 0–20)

## 2023-09-19 ENCOUNTER — Ambulatory Visit (INDEPENDENT_AMBULATORY_CARE_PROVIDER_SITE_OTHER): Admitting: Student in an Organized Health Care Education/Training Program

## 2023-09-19 ENCOUNTER — Encounter: Payer: Self-pay | Admitting: Student in an Organized Health Care Education/Training Program

## 2023-09-19 VITALS — BP 115/54 | HR 112 | Wt 183.0 lb

## 2023-09-19 DIAGNOSIS — M4316 Spondylolisthesis, lumbar region: Secondary | ICD-10-CM | POA: Diagnosis not present

## 2023-09-19 DIAGNOSIS — I4891 Unspecified atrial fibrillation: Secondary | ICD-10-CM | POA: Diagnosis not present

## 2023-09-19 DIAGNOSIS — T8149XA Infection following a procedure, other surgical site, initial encounter: Secondary | ICD-10-CM | POA: Insufficient documentation

## 2023-09-19 DIAGNOSIS — L089 Local infection of the skin and subcutaneous tissue, unspecified: Secondary | ICD-10-CM | POA: Diagnosis not present

## 2023-09-19 DIAGNOSIS — R634 Abnormal weight loss: Secondary | ICD-10-CM | POA: Insufficient documentation

## 2023-09-19 DIAGNOSIS — D7589 Other specified diseases of blood and blood-forming organs: Secondary | ICD-10-CM | POA: Insufficient documentation

## 2023-09-19 DIAGNOSIS — I5022 Chronic systolic (congestive) heart failure: Secondary | ICD-10-CM

## 2023-09-19 LAB — CBC WITH DIFFERENTIAL/PLATELET
Basophils Absolute: 0 10*3/uL (ref 0.0–0.1)
Basophils Relative: 0.5 % (ref 0.0–3.0)
Eosinophils Absolute: 0 10*3/uL (ref 0.0–0.7)
Eosinophils Relative: 0.4 % (ref 0.0–5.0)
HCT: 26.4 % — ABNORMAL LOW (ref 39.0–52.0)
Hemoglobin: 8.9 g/dL — ABNORMAL LOW (ref 13.0–17.0)
Lymphocytes Relative: 30.7 % (ref 12.0–46.0)
Lymphs Abs: 1.6 10*3/uL (ref 0.7–4.0)
MCHC: 33.6 g/dL (ref 30.0–36.0)
MCV: 85 fl (ref 78.0–100.0)
Monocytes Absolute: 0.2 10*3/uL (ref 0.1–1.0)
Monocytes Relative: 4.2 % (ref 3.0–12.0)
Neutro Abs: 3.3 10*3/uL (ref 1.4–7.7)
Neutrophils Relative %: 64.2 % (ref 43.0–77.0)
Platelets: 119 10*3/uL — ABNORMAL LOW (ref 150.0–400.0)
RBC: 3.11 Mil/uL — ABNORMAL LOW (ref 4.22–5.81)
RDW: 14.3 % (ref 11.5–15.5)
WBC: 5.1 10*3/uL (ref 4.0–10.5)

## 2023-09-19 LAB — COMPREHENSIVE METABOLIC PANEL WITH GFR
ALT: 20 U/L (ref 0–53)
AST: 20 U/L (ref 0–37)
Albumin: 4.1 g/dL (ref 3.5–5.2)
Alkaline Phosphatase: 77 U/L (ref 39–117)
BUN: 28 mg/dL — ABNORMAL HIGH (ref 6–23)
CO2: 27 meq/L (ref 19–32)
Calcium: 9.3 mg/dL (ref 8.4–10.5)
Chloride: 100 meq/L (ref 96–112)
Creatinine, Ser: 1.3 mg/dL (ref 0.40–1.50)
GFR: 52.29 mL/min — ABNORMAL LOW (ref >60.00)
Glucose, Bld: 107 mg/dL — ABNORMAL HIGH (ref 70–99)
Potassium: 4.3 meq/L (ref 3.5–5.1)
Sodium: 134 meq/L — ABNORMAL LOW (ref 135–145)
Total Bilirubin: 0.4 mg/dL (ref 0.2–1.2)
Total Protein: 7.2 g/dL (ref 6.0–8.3)

## 2023-09-19 NOTE — Assessment & Plan Note (Signed)
 Chronic ischemic heart failure with EF around 20%.  On exam today he looks dehydrated and hypovolemic.  I think that is driving his tachycardia.  He has a 13 pound weight loss since the back surgery in early May.  He has not been eating well since starting linezolid  or the surgical site infection.  And it has been historically hot the last few days and he does not have air conditioning in his truck.  So I think there are multifactorial reasons for him to be dehydrated today.  I recommended increasing his water  intake, gave him a couple bottles of water , and told him to discontinue furosemide  for 1 week until he can be reevaluated in our office.  Will check renal function today, last week there was a small increase in creatinine, I think there is risk that he has an AKI due to the dehydration.

## 2023-09-19 NOTE — Assessment & Plan Note (Signed)
 Transient issue of atrial fibrillation due to an acute illness after ICD placement and lead perforation.  He was tachycardic on exam today.  EKG is tachycardia, atrial sensed and ventricular paced.  I think the tachycardia is being driven by dehydration, not atrial fibrillation.

## 2023-09-19 NOTE — Assessment & Plan Note (Addendum)
 About 4 weeks out from a staph lug surgical site infection in the low back.  This was treated with incision and washout.  Operative exploration did not show purulence near the hardware.  The surgical site today looks to be healing well, there is no purulence.  He completed 4 weeks of linezolid , and recently switched to another few weeks of doxycycline .  He has consulted with Dr. Juli from infectious disease.  Given his dehydration, difficulty eating, tachycardia, there is some risk for worsening infection, especially with staff lug there is some risk of bacteremia and endocarditis.  I recommended a set of blood cultures, patient declined today because he does not want to travel to the [place] lab to have that drawn.  I asked him to call us  if he has any fevers or worsening systemic symptoms, and he agreed he would have the blood culture drawn if that occurs.  Patient would not be able to get an MRI of the back to look for osteomyelitis or hardware infection because of ICD in place.  For now we will continue with doxycycline , and reevaluate in 1 week.

## 2023-09-19 NOTE — Assessment & Plan Note (Signed)
 New problem.  13 pound weight loss since back surgery on May 12.  He has some high risk features including advanced age, history of stage II prostate cancer, and association with dysgeusia.  Probably multifactorial, person is dehydrated, recently been on antibiotics which may be affecting his appetite, and had a recent surgical site infection probably causing some systemic inflammation.  Will follow-up in 1 week.  If his oral intake does not improve, and if the weight loss continues, would recommend cross-sectional imaging to rule out malignancy.

## 2023-09-19 NOTE — Progress Notes (Addendum)
 Acute Office Visit  Subjective:     Patient ID: [name] [name], male    DOB: 01-Feb-1946, 78 y.o.   MRN: [id number]  Chief Complaint  Patient presents with   Shortness of Breath    Patient states he got up this morning he has been out of breath,weak and shaking. Dr.Jones office did check vitals Pule was high but BP was okay. Has not felt good for the last 3 days.    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  Patient has had a very difficult 2 months.  He had a lumbar fixation surgery on May 12, unfortunately that was complicated by a surgical wound infection.  He had repeat surgery with washout of that infection on June 3.  Cultures from the surgical site were positive for staph lugdunensis.  He was treated with 4 weeks of linezolid .  He lives by himself in [place], independent, drives himself, but has been struggling to take care of himself over the last week.  Feeling more weak, short of breath, low energy.  He has had low appetite, has not been eating very much, says food does not taste right anymore.  Reports good adherence with his medications, has been using furosemide  3 times weekly.  Denies any chest pain or chest pressure.  He has shortness of breath with exertion, was able to walk into the office today from the parking deck but had to take his time.  Seems like NYHA class II symptoms.  Denies orthopnea or PND.  Denies fevers or chills.  Low back pain has been improving.  He has had a lot of constipation, bloating, which he attributes to the linezolid .      Objective:    BP (!) 115/54   Pulse (!) 112   Wt 183 lb (83 kg)   SpO2 98%   BMI 27.83 kg/m    Physical Exam  Gen: Well-appearing man Mouth: Dry mucous membranes Heart: Tachycardic, 3 out of 6 early systolic murmur best heard at the left  upper sternal border, no JVD Lungs: Unlabored, clear to auscultation throughout with no crackles Abd: Soft, nontender, nondistended, no organomegaly Ext: Warm to touch distally, no edema in either leg Skin: Lumbar surgical sites is clean, well-approximated, minimal erythema, no purulent drainage, no tenderness to palpation  POCUS: Left ventricle EF is severely reduced, no pericardial effusion, no pleural effusions, no pulmonary B-lines, the IVC is less than 2 cm in diameter and fully collapses with inspiration.  Left and right kidneys appear mildly atrophied, no hydronephrosis.  Bladder is decompressed, there is prostate enlargement.  EKG: Single lead V paced with a wide QRS, there appears to be small P waves best seen in V5, heart rate 108 bpm     Assessment & Plan:   Problem List Items Addressed This Visit       Unprioritized   Chronic HFrEF (heart failure with reduced ejection fraction) (HCC) (Chronic)   Chronic ischemic heart failure with EF around 20%.  On exam today he looks dehydrated and hypovolemic.  I think that is driving his tachycardia.  He has a 13 pound weight loss since the back surgery in early May.  He has not been eating well since starting linezolid  or the surgical site infection.  And it has been historically hot the last few days  and he does not have air conditioning in his truck.  So I think there are multifactorial reasons for him to be dehydrated today.  I recommended increasing his water  intake, gave him a couple bottles of water , and told him to discontinue furosemide  for 1 week until he can be reevaluated in our office.  Will check renal function today, last week there was a small increase in creatinine, I think there is risk that he has an AKI due to the dehydration.      Atrial fibrillation, transient (HCC) (Chronic)   Transient issue of atrial fibrillation due to an acute illness after ICD placement and lead perforation.  He was tachycardic on exam today.  EKG is  tachycardia, atrial sensed and ventricular paced.  I think the tachycardia is being driven by dehydration, not atrial fibrillation.      Relevant Orders   EKG 12-Lead (Completed)   Surgical site infection - Primary   About 4 weeks out from a staph lug surgical site infection in the low back.  This was treated with incision and washout.  Operative exploration did not show purulence near the hardware.  The surgical site today looks to be healing well, there is no purulence.  He completed 4 weeks of linezolid , and recently switched to another few weeks of doxycycline .  He has consulted with Dr. Juli from infectious disease.  Given his dehydration, difficulty eating, tachycardia, there is some risk for worsening infection, especially with staff lug there is some risk of bacteremia and endocarditis.  I recommended a set of blood cultures, patient declined today because he does not want to travel to the [place] lab to have that drawn.  I asked him to call us  if he has any fevers or worsening systemic symptoms, and he agreed he would have the blood culture drawn if that occurs.  Patient would not be able to get an MRI of the back to look for osteomyelitis or hardware infection because of ICD in place.  For now we will continue with doxycycline , and reevaluate in 1 week.      Relevant Orders   CBC with Differential/Platelet (Completed)   Comprehensive metabolic panel with GFR (Completed)   Unintentional weight loss   New problem.  13 pound weight loss since back surgery on May 12.  He has some high risk features including advanced age, history of stage II prostate cancer, and association with dysgeusia.  Probably multifactorial, person is dehydrated, recently been on antibiotics which may be affecting his appetite, and had a recent surgical site infection probably causing some systemic inflammation.  Will follow-up in 1 week.  If his oral intake does not improve, and if the weight loss continues, would recommend  cross-sectional imaging to rule out malignancy.      Myelosuppression   Lab work today shows a moderate normocytic anemia and new moderate thrombocytopenia.  I think these are probably a result of the 4-week treatment with linezolid .  Thankfully he discontinued this medication yesterday and is already switched to doxycycline .  I think the anemia is likely contributing to his sensation of fatigue and tachycardia.  Will treat this with supportive care with hydration at home and hopefully would see the blood counts improve in the coming days.  If his symptoms worsen may need to arrange for blood transfusion.  Will have close follow-up with him towards the end of the week.       Return in about 1 week (around 09/26/2023).  Cleatus Debby Specking,  MD

## 2023-09-19 NOTE — Assessment & Plan Note (Signed)
 Lab work today shows a moderate normocytic anemia and new moderate thrombocytopenia.  I think these are probably a result of the 4-week treatment with linezolid .  Thankfully he discontinued this medication yesterday and is already switched to doxycycline .  I think the anemia is likely contributing to his sensation of fatigue and tachycardia.  Will treat this with supportive care with hydration at home and hopefully would see the blood counts improve in the coming days.  If his symptoms worsen may need to arrange for blood transfusion.  Will have close follow-up with him towards the end of the week.

## 2023-09-19 NOTE — Patient Instructions (Signed)
 Stop taking furosemide  for the next 1 week until we see you back in the office.  Increase your fluid intake, and try your best to eat 3 meals per day.  Come back to the office in 1 week for a follow-up appointment.  Please come back sooner if you are feeling lightheaded or develop any other new symptoms.

## 2023-09-20 ENCOUNTER — Telehealth: Payer: Self-pay | Admitting: Family Medicine

## 2023-09-20 ENCOUNTER — Ambulatory Visit: Payer: Self-pay | Admitting: Student in an Organized Health Care Education/Training Program

## 2023-09-20 NOTE — Telephone Encounter (Signed)
 Per previous note in chart Dr.Vincent was able to return patients call.

## 2023-09-20 NOTE — Telephone Encounter (Signed)
 Copied from CRM [id number]. Topic: General - Call Back - No Documentation >> Sep 20, 2023  8:29 AM Mercedes MATSU wrote:   Reason for CRM: Patient called in stating that he had a missed call from the doctor and he would like a call back. Patient can be reached at [phone].

## 2023-09-22 ENCOUNTER — Ambulatory Visit (HOSPITAL_COMMUNITY)
Admission: RE | Admit: 2023-09-22 | Discharge: 2023-09-22 | Disposition: A | Discharge status: Home / Self Care | Source: Ambulatory Visit | Attending: Internal Medicine | Admitting: Internal Medicine

## 2023-09-22 ENCOUNTER — Telehealth: Payer: Self-pay

## 2023-09-22 DIAGNOSIS — Y718 Miscellaneous cardiovascular devices associated with adverse incidents, not elsewhere classified: Secondary | ICD-10-CM | POA: Insufficient documentation

## 2023-09-22 DIAGNOSIS — Y849 Medical procedure, unspecified as the cause of abnormal reaction of the patient, or of later complication, without mention of misadventure at the time of the procedure: Secondary | ICD-10-CM | POA: Diagnosis not present

## 2023-09-22 DIAGNOSIS — T82110A Breakdown (mechanical) of cardiac electrode, initial encounter: Secondary | ICD-10-CM | POA: Insufficient documentation

## 2023-09-22 DIAGNOSIS — R7871 Abnormal lead level in blood: Secondary | ICD-10-CM | POA: Diagnosis not present

## 2023-09-22 DIAGNOSIS — I5022 Chronic systolic (congestive) heart failure: Secondary | ICD-10-CM | POA: Diagnosis not present

## 2023-09-22 NOTE — Telephone Encounter (Signed)
 Call received from Pt.  Per Pt he feels a vibration in his chest.  Pt sent transmission.    Transmission reviewed.  Pt RV impedance >3000.  Call placed to [employer] tech support.  Per tech support concern would be if RV sensing were to worsen Pt may receive inappropriate therapy or no therapy.  Spoke with Dr. Waddell.  Per Dr. Waddell get chest xray for Pt and schedule follow up with him.  Pt scheduled to see Dr. Waddell September 28, 2023 at 10:15 am.  Will get chest xray.  Pt advised.  He states he can come to [facility] office by 4 pm.  Order entered.  Will continue to monitor.

## 2023-09-27 ENCOUNTER — Ambulatory Visit: Admitting: Family Medicine

## 2023-09-27 VITALS — BP 130/74 | HR 81 | Temp 98.6°F | Resp 19 | Ht 68.0 in | Wt 185.6 lb

## 2023-09-27 DIAGNOSIS — D649 Anemia, unspecified: Secondary | ICD-10-CM | POA: Diagnosis not present

## 2023-09-27 DIAGNOSIS — T8149XA Infection following a procedure, other surgical site, initial encounter: Secondary | ICD-10-CM

## 2023-09-27 DIAGNOSIS — D7589 Other specified diseases of blood and blood-forming organs: Secondary | ICD-10-CM

## 2023-09-27 DIAGNOSIS — R7989 Other specified abnormal findings of blood chemistry: Secondary | ICD-10-CM | POA: Diagnosis not present

## 2023-09-27 LAB — CBC
HCT: 28.5 % — ABNORMAL LOW (ref 39.0–52.0)
Hemoglobin: 9.2 g/dL — ABNORMAL LOW (ref 13.0–17.0)
MCHC: 32.4 g/dL (ref 30.0–36.0)
MCV: 86.8 fl (ref 78.0–100.0)
Platelets: 175 10*3/uL (ref 150.0–400.0)
RBC: 3.28 Mil/uL — ABNORMAL LOW (ref 4.22–5.81)
RDW: 15.5 % (ref 11.5–15.5)
WBC: 6.2 10*3/uL (ref 4.0–10.5)

## 2023-09-27 LAB — BASIC METABOLIC PANEL WITH GFR
BUN: 19 mg/dL (ref 6–23)
CO2: 28 meq/L (ref 19–32)
Calcium: 9.3 mg/dL (ref 8.4–10.5)
Chloride: 106 meq/L (ref 96–112)
Creatinine, Ser: 1.09 mg/dL (ref 0.40–1.50)
GFR: 64.6 mL/min (ref >60.00)
Glucose, Bld: 100 mg/dL — ABNORMAL HIGH (ref 70–99)
Potassium: 4.5 meq/L (ref 3.5–5.1)
Sodium: 141 meq/L (ref 135–145)

## 2023-09-27 NOTE — Telephone Encounter (Signed)
 Mulu with [employer] is following up on behalf of patient. She would like to know if the fracture was confirmed by an xray or some other type of imaging, lead status, whether the device is still implanted or has been removed, date of the procedure, how patient is doing before/after, and patient's weight. Please advise.

## 2023-09-27 NOTE — Patient Instructions (Addendum)
 Glad to hear you are doing better now, and over the past week. If any return of blurry vision or similar symptoms as this morning - be seen in ER.  I will check labs today. If those are improved then can restart furosemide  2-3 times per week.   Hang in there. Please reach out if there are any new concerns, but I will see you in 2 weeks.   Return to the clinic or go to the nearest emergency room if any of your symptoms worsen or new symptoms occur.

## 2023-09-27 NOTE — Progress Notes (Signed)
 Subjective:  Patient ID: [name] [name], male    DOB: 19-Jul-1945  Age: 78 y.o. MRN: [id number]  CC:  Chief Complaint  Patient presents with   Wound Infection    Pt notes he isnt sure how the infection is, notes he is fatigued     HPI [name] [name] presents for  Follow-up.  Surgical site infection Lumbar fixation surgery in May, complicated by surgical wound infection.  Repeat surgery June 3, culture positive for staph ludunensis, treated with 4 weeks of linezolid , then doxycycline , followed by infectious disease.   He was seen by my colleague on June 24 -sent from neurosurgery due to tachycardia feeling unwell.  Appreciate Dr. Deadra assistance.  Appearred to be volume depleted, dehydrated.  EKG with tachycardia, atrial sensed, ventricular paced.  Tachycardia thought to be driven from the dehydration, not A-fib.  CBC at his visit with Dr. Jerrell indicated anemia and thrombocytopenia, suspected myelosuppression effective linezolid .  Had been off for 2 days, just on doxycycline  at that time, with expected improvement in myelosuppression within days to weeks.  Continued oral rehydration and held furosemide  at June 24 visit for 1 week until recheck.  History of chronic heart failure with reduced EF, bundle branch block, ICD.   Since last visit, he is feeling better each day. Drinking more fluid, eating better. Yesterday felt that best he has in a while.  This morning felt more fatigued than yesterday. Was more active than usual yesterday - more walking, some work outside - pulling some weeds.   No new chest pains. Slight increased shortness of breath this am, better now. Slight blurry vision this am hat has resolved. Only lasted an hour or so. No focal weakness, slurred speech or focal weakness, and back to normal self now in office.  No fevers.  Still off furosemide . No new leg swelling. Usually takes furosemide  40mg  3 times per week.    Lab Results  Component Value Date    NA 134 (L) 09/19/2023   K 4.3 09/19/2023   CL 100 09/19/2023   CO2 27 09/19/2023   Lab Results  Component Value Date   CREATININE 1.30 09/19/2023  Prior 1.38 on 6/18, 1.13 on 09/03/23  Lab Results  Component Value Date   WBC 5.1 09/19/2023   HGB 8.9 Repeated and verified X2. (L) 09/19/2023   HCT 26.4 (L) 09/19/2023   MCV 85.0 09/19/2023   PLT 119.0 (L) 09/19/2023  Prior hgb 10.3 on 6/18, 10.6 on 6/8.  No melena/hematochezia.   History Patient Active Problem List   Diagnosis Date Noted   Surgical site infection 09/19/2023   Unintentional weight loss 09/19/2023   Myelosuppression 09/19/2023   Postoperative wound breakdown 08/29/2023   S/P lumbar fusion 08/07/2023   Foreign body in right ear 06/09/2023   Impacted cerumen of left ear 06/09/2023   Fatigue 08/17/2022   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   Hx of colonic polyps 01/10/2022   Atrial fibrillation, transient (HCC) 11/02/2021   Hypotension    Chest pain 10/28/2021   Prostate cancer (HCC) 04/02/2021   Chronic HFrEF (heart failure with reduced ejection fraction) (HCC) 05/20/2020   Claudication (HCC) 09/01/2019   S/P cervical spinal fusion 04/20/2018   Paresthesia 01/10/2018   Neck pain 01/10/2018   LBBB (left bundle branch block) 09/07/2017   Other meniscus derangements, posterior horn of medial meniscus, left knee, insufficency fracture medial tibial plateau 04/07/2017   Closed nondisplaced fracture of lateral condyle of left tibia with routine healing  04/07/2017   Status post arthroscopy of left knee 04/07/2017   Insomnia 09/14/2015   Hyperlipidemia with target LDL less than 100 09/11/2014   Rotator cuff tendinitis 04/28/2014   Obesity (BMI 30-39.9) 12/29/2013   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  Elevated LVEDP.;  09/2020: EF 20-25%.  GR 1 DD   Numbness    hands   Personal history of arthritis    Personal history of other diseases of circulatory system    Pre-diabetes    Sickle cell anemia (HCC)    pt denies   Past Surgical History:  Procedure Laterality Date   ANTERIOR CERVICAL DECOMP/DISCECTOMY FUSION N/A 04/20/2018   Procedure: Anterior Cervical Decompression Fusion - Cervical Three-Cervical Four - Cervical Four-Cervical Five - Cervical Five-Cervical Six;  Surgeon: Joshua Alm RAMAN, MD;  Location: [facility] OR;  Service: Neurosurgery;  Laterality: N/A;  Anterior Cervical Decompression Fusion - Cervical Three-Cervical Four - Cervical Four-Cervical Five - Cervical Five-Cervical Six   APPENDECTOMY  AS CHILD   BACK SURGERY     BIV ICD INSERTION CRT-D N/A 10/27/2021   Procedure: BIV ICD INSERTION CRT-D;  Surgeon: Waddell Danelle ORN, MD;  Location: [facility] INVASIVE CV LAB;  Service: Cardiovascular;  Laterality: N/A;   CARDIOVASCULAR STRESS TEST  10/05/1998   MILD GLOBAL HYPOKINESIS AND ISCHEMIAIN ANTEROSEPTAL  AT [facility]/ EF 42%   CARPAL TUNNEL RELEASE Bilateral    CENTRAL LINE INSERTION  10/28/2021   Procedure: CENTRAL LINE INSERTION;  Surgeon: Inocencio Soyla Lunger, MD;  Location: [facility] INVASIVE CV LAB;  Service:  Cardiovascular;;   CENTRAL LINE INSERTION  10/28/2021   Procedure: CENTRAL LINE INSERTION;  Surgeon: Wonda Sharper, MD;  Location: [facility] INVASIVE CV LAB;  Service: Cardiovascular;;   COLONOSCOPY WITH PROPOFOL  N/A 03/14/2022   Procedure: COLONOSCOPY WITH PROPOFOL ;  Surgeon: Shila Gustav GAILS, MD;  Location: [facility] ENDOSCOPY;  Service: Gastroenterology;  Laterality: N/A;   EXCISION MELANOMA LEFT UPPER BACK  10/14/2009   eye lid surgery Bilateral 2018   GOLD SEED IMPLANT N/A 07/23/2021   Procedure: GOLD SEED IMPLANT;  Surgeon: Cam Morene ORN, MD;  Location: [facility] ORS;  Service: Urology;  Laterality: N/A;   KNEE ARTHROSCOPY WITH SUBCHONDROPLASTY Left 04/07/2017   Procedure: LEFT KNEE ARTHROSCOPY WITH PARTIAL MEDIAL MENISCECTOMY AND MEDIAL TIBIAL SUBCHONDROPLASTY;  Surgeon: Vernetta Lonni GRADE, MD;  Location: [facility] ORS;  Service: Orthopedics;  Laterality: Left;   knee injections Bilateral    LEAD REVISION/REPAIR N/A 10/28/2021   Procedure: LEAD REVISION/REPAIR;  Surgeon: Inocencio Soyla Lunger, MD;  Location: [facility] INVASIVE CV LAB;  Service: Cardiovascular;  Laterality: N/A;  LEFT HEART CATH AND CORONARY ANGIOGRAPHY  04/2002   minimal irregularities in the LAD/  EF 50%  -   DR AL LITTLE   LUMBAR DISC SURGERY  09/12/2000   left  L5 -- S1   LUMBAR WOUND DEBRIDEMENT N/A 08/29/2023   Procedure: LUMBAR WOUND IRRIGATION AND DEBRIDEMENT;  Surgeon: Lanis Pupa, MD;  Location: [facility] OR;  Service: Neurosurgery;  Laterality: N/A;  I & D Lumbar wound   PERICARDIOCENTESIS N/A 10/28/2021   Procedure: PERICARDIOCENTESIS;  Surgeon: Inocencio Soyla Lunger, MD;  Location: [facility] INVASIVE CV LAB;  Service: Cardiovascular;  Laterality: N/A;   PERICARDIOCENTESIS N/A 10/28/2021   Procedure: PERICARDIOCENTESIS;  Surgeon: Wonda Sharper, MD;  Location: [facility] INVASIVE CV LAB;  Service: Cardiovascular;  Laterality: N/A;   PROSTATE BIOPSY N/A 05/22/2013   Procedure: BIOPSY TRANSRECTAL ULTRASONIC PROSTATE (TUBP);  Surgeon: Alm GORMAN Fragmin, MD;  Location: [facility];  Service: Urology;  Laterality: N/A;   RIGHT/LEFT HEART CATH AND CORONARY ANGIOGRAPHY N/A 06/25/2020   Procedure: RIGHT/LEFT HEART CATH AND CORONARY ANGIOGRAPHY;  Surgeon: Claudene Victory ORN, MD;  Location: [facility] INVASIVE CV LAB;:; Normal coronary arteries; LVEDP 23 mmHg; PCWP 19 mmHg.   ROTATOR CUFF REPAIR Left 2016   SPACE OAR INSTILLATION N/A 07/23/2021   Procedure: SPACE OAR INSTILLATION;  Surgeon: Cam Morene ORN, MD;  Location: [facility] ORS;  Service: Urology;  Laterality: N/A;   THROAT SURGERY  04/20/2018   TRANSRECTAL ULTRASOUND PROSTATE BX  05-23-2005  &  04-19-2001   TRANSTHORACIC ECHOCARDIOGRAM  09/2017   EF 45-50 % (previously reported as 40 to 45%).  Incoordinate septal motion with mild LVH.  GR 1 DD.  Aortic sclerosis but no stenosis.   TRANSTHORACIC ECHOCARDIOGRAM  10/06/2020   Severely reduced EF of 20 to 25%.  No LV thrombus.  Severe septal-lateral wall systolic dyssynchrony due to LBBB.  Global HK.  Moderately dilated LV.  GR 1 DD with mild LA dilation..  Unable to assess PAP with normal RV size and function.  Normal RAP.  Mild AOV sclerosis but no stenosis.  Mild to moderate MR.   TRANSTHORACIC ECHOCARDIOGRAM  06/16/2020   Severely reduced EF <20%.  Moderate severely dilated LV.  GR 2 DD.  Elevated LVEDP.  Mild LA dilation.  Mildly reduced RV function.  Mild MR.  Mild aortic valve calcification   Allergies  Allergen Reactions   Nitrostat [Nitroglycerin] Anaphylaxis    Cardiologist suggested he shouldn't take this med because it could kill him with his heart condition. Lowers BP   Vancomycin  Hives    08/29/23 per confirmation with RN, shortly after starting vancomycin  infusion patient developed itching and a rash on his torso   Ace Inhibitors Swelling   Mevacor  [Lovastatin ] Nausea Only   Solarcaine [Benzocaine] Dermatitis   Prior to Admission medications   Medication Sig Start Date End Date Taking? Authorizing Provider   acetaminophen  (TYLENOL ) 500 MG tablet Take 1,000 mg by mouth every 6 (six) hours as needed for headache.   Yes [provider]  aspirin  EC 81 MG tablet Take 1 tablet (81 mg total) by mouth daily. Patient taking differently: Take 81 mg by mouth in the morning. 04/26/18  Yes Costella, Vincent J, PA-C  colchicine  0.6 MG tablet TAKE 1 TABLET TWICE DAILY Patient taking differently: Take 0.6 mg by mouth as needed. 08/25/23  Yes Levora Reyes SAUNDERS, MD  Cyanocobalamin (VITAMIN B-12 PO) Take 1,000 mcg by mouth in the morning.   Yes [provider]  doxycycline  (VIBRAMYCIN ) 100 MG  capsule Take 1 capsule (100 mg total) by mouth 2 (two) times daily. 09/13/23  Yes Luiz Channel, MD  finasteride  (PROSCAR ) 5 MG tablet Take 5 mg by mouth at bedtime.   Yes [provider]  gabapentin  (NEURONTIN ) 300 MG capsule Take 1-2 capsules (300-600 mg total) by mouth 3 (three) times daily as needed (pain). Patient taking differently: Take 600 mg by mouth 2 (two) times daily. 02/27/23  Yes Levora Reyes SAUNDERS, MD  hydrALAZINE  (APRESOLINE ) 25 MG tablet Take 1 tablet (25 mg total) by mouth in the morning and at bedtime. Patient taking differently: Take 50 mg by mouth in the morning and at bedtime. 08/25/23  Yes Anner Alm ORN, MD  Magnesium  Hydroxide (DULCOLAX PO) Take 2 tablets by mouth 2 (two) times daily as needed.   Yes [provider]  MAGNESIUM  PO Take 400 mg by mouth in the morning.   Yes [provider]  methocarbamol  (ROBAXIN ) 500 MG tablet Take 1 tablet (500 mg total) by mouth every 6 (six) hours as needed for muscle spasms. 09/03/23  Yes Bergman, Meghan D, NP  Multiple Vitamin (MULTIVITAMIN WITH MINERALS) TABS tablet Take 1 tablet by mouth in the morning. One-A-Day Men Health Multivitamin   Yes [provider]  Omega-3 Fatty Acids (FISH OIL PO) Take 1,000 mg by mouth in the morning.   Yes [provider]  rosuvastatin  (CRESTOR ) 5 MG tablet Take 1 tablet (5 mg  total) by mouth every Monday, Wednesday, and Friday. Patient taking differently: Take 5 mg by mouth every Monday, Wednesday, and Friday. At night 01/25/23 07/26/26 Yes West, Katlyn D, NP  tamsulosin  (FLOMAX ) 0.4 MG CAPS capsule TAKE 1 CAPSULE EVERY DAY Patient taking differently: Take 0.4 mg by mouth at bedtime. 08/04/22  Yes Levora Reyes SAUNDERS, MD  Turmeric (QC TUMERIC COMPLEX PO) Take 1 tablet by mouth in the morning.   Yes [provider]  zinc  sulfate, 50mg  elemental zinc , 220 (50 Zn) MG capsule Take 220 mg by mouth in the morning.   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  Tobacco Use   Smoking status: Former    Current packs/day: 0.00    Average packs/day: 2.0 packs/day for 20.0 years (40.0 ttl pk-yrs)    Types: Cigarettes    Start date: 03/28/1960    Quit date: 03/28/1980    Years since quitting: 43.5   Smokeless tobacco: Never  Vaping Use   Vaping status: Never Used  Substance and Sexual Activity   Alcohol  use: No    Alcohol /week: 0.0 standard drinks of alcohol    Drug use: No   Sexual activity: Not Currently  Other Topics Concern   Not on file  Social History Narrative   He walks on a relatively regular basis about 15 minutes at a time mostly to the discomfort. He previously had walked up a 30-40 minutes a time without problems. Education: [employer].   Lives alone.   Right-handed.   One cup coffee daily.   Social Drivers of Corporate investment banker Strain: Low Risk  (12/01/2022)   Overall Financial Resource Strain (CARDIA)    Difficulty of Paying Living Expenses: Not hard at all  Food  Insecurity: No Food Insecurity (08/31/2023)   Hunger Vital Sign    Worried About Running Out of  Food in the Last Year: Never true    Ran Out of Food in the Last Year: Never true  Transportation Needs: No Transportation Needs (08/31/2023)   PRAPARE - Administrator, Civil Service (Medical): No    Lack of Transportation (Non-Medical): No  Physical Activity: Sufficiently Active (12/01/2022)   Exercise Vital Sign    Days of Exercise per Week: 5 days    Minutes of Exercise per Session: 30 min  Stress: No Stress Concern Present (12/01/2022)   [employer] of Occupational Health - Occupational Stress Questionnaire    Feeling of Stress : Not at all  Social Connections: Moderately Isolated (08/31/2023)   Social Connection and Isolation Panel    Frequency of Communication with Friends and Family: More than three times a week    Frequency of Social Gatherings with Friends and Family: Three times a week    Attends Religious Services: More than 4 times per year    Active Member of Clubs or Organizations: No    Attends Banker Meetings: Never    Marital Status: Widowed  Intimate Partner Violence: Not At Risk (08/31/2023)   Humiliation, Afraid, Rape, and Kick questionnaire    Fear of Current or Ex-Partner: No    Emotionally Abused: No    Physically Abused: No    Sexually Abused: No    Review of Systems   Objective:   Vitals:   09/27/23 1113  BP: 130/74  Pulse: 81  Resp: 19  Temp: 98.6 F (37 C)  TempSrc: Temporal  SpO2: 97%  Weight: 185 lb 9.6 oz (84.2 kg)  Height: 5' 8 (1.727 m)     Physical Exam Vitals reviewed.  Constitutional:      Appearance: He is well-developed.  HENT:     Head: Normocephalic and atraumatic.  Neck:     Vascular: No carotid bruit or JVD.  Cardiovascular:     Rate and Rhythm: Normal rate and regular rhythm.     Heart sounds: Normal heart sounds. No murmur heard. Pulmonary:     Effort: Pulmonary effort is normal.     Breath sounds: Normal breath sounds. No rales.  Musculoskeletal:     Right lower leg: No edema.     Left  lower leg: No edema.  Skin:    General: Skin is warm and dry.  Neurological:     General: No focal deficit present.     Mental Status: He is alert and oriented to person, place, and time.  Psychiatric:        Mood and Affect: Mood normal.        Assessment & Plan:  Jermond Burkemper is a 78 y.o. male . Surgical site infection  Myelosuppression - Plan: CBC  Low hemoglobin - Plan: CBC  Elevated serum creatinine - Plan: Basic metabolic panel with GFR  Surgical site infection as above, followed by infectious disease, now just on doxycycline , possible prior myelosuppression with previous treatment.  Repeat labs obtained.  Prior volume ablation appears to have improved with increased hydration, no apparent fluid overload but will have him restart his furosemide  next week as long as labs are stable, improved with RTC precautions.  Symptomatically he does feel better as above, continue to monitor. If any recurrence of blurry vision or similar symptoms as he felt briefly in the morning, ER precautions given.  Nonfocal exam, reassuring vital  signs in office.  No orders of the defined types were placed in this encounter.  Patient Instructions  Glad to hear you are doing better now, and over the past week. If any return of blurry vision or similar symptoms as this morning - be seen in ER.  I will check labs today. If those are improved then can restart furosemide  2-3 times per week.      Signed,   Reyes Pines, MD [facility], [facility] 09/27/23 11:49 AM

## 2023-09-28 ENCOUNTER — Encounter: Payer: Self-pay | Admitting: Internal Medicine

## 2023-09-28 ENCOUNTER — Ambulatory Visit: Attending: Internal Medicine | Admitting: Internal Medicine

## 2023-09-28 VITALS — BP 100/62 | HR 88 | Ht 68.0 in | Wt 186.0 lb

## 2023-09-28 DIAGNOSIS — I5022 Chronic systolic (congestive) heart failure: Secondary | ICD-10-CM | POA: Diagnosis not present
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
## 2023-09-28 NOTE — Patient Instructions (Signed)
 Medication Instructions:  Your physician recommends that you continue on your current medications as directed. Please refer to the Current Medication list given to you today.  *If you need a refill on your cardiac medications before your next appointment, please call your pharmacy*  Lab Work: None ordered.  You may go to any [facility] Location for your lab work:  [facility] - [address] ([facility]) - [address] [phone] [address]  [place] - [address]   [place]  - [address]   [place] - [address] - [address] [employer] [place]  - [address] ([facility]   If you have labs (blood work) drawn today and your tests are completely normal, you will receive your results only by: [employer] (if you have MyChart)  If you have any lab test that is abnormal or we need to change your treatment, we will call you or send a MyChart message to review the results.  Testing/Procedures: None ordered.  Follow-Up: At [facility], you and your health needs are our priority.  As part of our continuing mission to provide you with exceptional heart care, we have created designated Provider Care Teams.  These Care Teams include your primary Cardiologist (physician) and Advanced Practice Providers (APPs -  Physician Assistants and Nurse Practitioners) who all work together to provide you with the care you need, when you need it.  We recommend signing up for the patient portal called "MyChart".  Sign up information is provided on this After Visit Summary.  MyChart is used to connect with patients for Virtual Visits (Telemedicine).  Patients are able to view lab/test results, encounter notes, upcoming appointments, etc.  Non-urgent messages can be sent to your provider as well.   To learn more about what you can do with MyChart, go to  [email or web address].    Your next appointment:   1 year(s)  The format for your next appointment:   In Person  Provider:   Manya Sells, MD{or one of the following Advanced Practice Providers on your designated Care Team:   Mertha Abrahams, [place] Bambi Lever "Jonelle Neri" [place], [place] Neda Balk, NP  Note: Remote monitoring is used to monitor your Pacemaker/ ICD from home. This monitoring reduces the number of office visits required to check your device to one time per year. It allows us  to keep an eye on the functioning of your device to ensure it is working properly.

## 2023-09-29 ENCOUNTER — Encounter: Payer: Self-pay | Admitting: Family Medicine

## 2023-09-29 ENCOUNTER — Ambulatory Visit: Payer: Self-pay | Admitting: Family Medicine

## 2023-09-29 DIAGNOSIS — D649 Anemia, unspecified: Secondary | ICD-10-CM

## 2023-09-29 DIAGNOSIS — R7989 Other specified abnormal findings of blood chemistry: Secondary | ICD-10-CM

## 2023-10-03 ENCOUNTER — Other Ambulatory Visit (INDEPENDENT_AMBULATORY_CARE_PROVIDER_SITE_OTHER)

## 2023-10-03 DIAGNOSIS — R7989 Other specified abnormal findings of blood chemistry: Secondary | ICD-10-CM

## 2023-10-03 DIAGNOSIS — D649 Anemia, unspecified: Secondary | ICD-10-CM | POA: Diagnosis not present

## 2023-10-03 LAB — CBC WITH DIFFERENTIAL/PLATELET
Basophils Absolute: 0.1 K/uL (ref 0.0–0.1)
Basophils Relative: 1.1 % (ref 0.0–3.0)
Eosinophils Absolute: 0.1 K/uL (ref 0.0–0.7)
Eosinophils Relative: 1.5 % (ref 0.0–5.0)
HCT: 26.3 % — ABNORMAL LOW (ref 39.0–52.0)
Hemoglobin: 8.7 g/dL — ABNORMAL LOW (ref 13.0–17.0)
Lymphocytes Relative: 37.9 % (ref 12.0–46.0)
Lymphs Abs: 2.2 K/uL (ref 0.7–4.0)
MCHC: 32.9 g/dL (ref 30.0–36.0)
MCV: 85.7 fl (ref 78.0–100.0)
Monocytes Absolute: 0.4 K/uL (ref 0.1–1.0)
Monocytes Relative: 7.4 % (ref 3.0–12.0)
Neutro Abs: 3.1 K/uL (ref 1.4–7.7)
Neutrophils Relative %: 52.1 % (ref 43.0–77.0)
Platelets: 208 K/uL (ref 150.0–400.0)
RBC: 3.07 Mil/uL — ABNORMAL LOW (ref 4.22–5.81)
RDW: 16.9 % — ABNORMAL HIGH (ref 11.5–15.5)
WBC: 5.9 K/uL (ref 4.0–10.5)

## 2023-10-03 LAB — BASIC METABOLIC PANEL WITH GFR
BUN: 25 mg/dL — ABNORMAL HIGH (ref 6–23)
CO2: 25 meq/L (ref 19–32)
Calcium: 9.5 mg/dL (ref 8.4–10.5)
Chloride: 104 meq/L (ref 96–112)
Creatinine, Ser: 1.18 mg/dL (ref 0.40–1.50)
GFR: 58.72 mL/min — ABNORMAL LOW (ref >60.00)
Glucose, Bld: 100 mg/dL — ABNORMAL HIGH (ref 70–99)
Potassium: 4.2 meq/L (ref 3.5–5.1)
Sodium: 137 meq/L (ref 135–145)

## 2023-10-04 ENCOUNTER — Ambulatory Visit: Payer: Self-pay | Admitting: Family Medicine

## 2023-10-05 ENCOUNTER — Other Ambulatory Visit: Payer: Self-pay | Admitting: Family Medicine

## 2023-10-05 DIAGNOSIS — M109 Gout, unspecified: Secondary | ICD-10-CM

## 2023-10-05 NOTE — Telephone Encounter (Signed)
 Noted message on result note: Patient states he is having pain on and off in his back but the pain seems to be more towards his kidneys. Burning with urination. Patient states he wakes up with pain most days as well. Does have an appointment with you on 7/23  Would like to have him seen tomorrow, Friday at 10/06/2023 by any provider for possible if he is having burning with urination along with back pain as that could indicate infection.  Should not wait till July 23, again please have him schedule with any provider tomorrow for evaluation.  Thanks

## 2023-10-06 ENCOUNTER — Encounter: Payer: Self-pay | Admitting: Student in an Organized Health Care Education/Training Program

## 2023-10-06 ENCOUNTER — Ambulatory Visit (INDEPENDENT_AMBULATORY_CARE_PROVIDER_SITE_OTHER): Admitting: Student in an Organized Health Care Education/Training Program

## 2023-10-06 VITALS — BP 111/55 | HR 92 | Temp 98.0°F | Ht 68.0 in | Wt 187.0 lb

## 2023-10-06 DIAGNOSIS — R399 Unspecified symptoms and signs involving the genitourinary system: Secondary | ICD-10-CM | POA: Diagnosis not present

## 2023-10-06 LAB — POC URINALSYSI DIPSTICK (AUTOMATED)
Bilirubin, UA: NEGATIVE
Blood, UA: NEGATIVE
Glucose, UA: NEGATIVE
Ketones, UA: NEGATIVE
Leukocytes, UA: NEGATIVE
Nitrite, UA: NEGATIVE
Protein, UA: NEGATIVE
Spec Grav, UA: 1.015 (ref 1.010–1.025)
Urobilinogen, UA: 0.2 U/dL
pH, UA: 6 (ref 5.0–8.0)

## 2023-10-06 NOTE — Assessment & Plan Note (Addendum)
 Acute on chronic issue of a mild sensation of dysuria, and some discomfort at his low back.  He wanted an evaluation for a urinary tract infection.  He has not had a UTI in the past.  Does have a history of prostate cancer and prostate enlargement.  This was treated with radiation therapy.  He is on medical management for lower urinary tract symptoms with Flomax  and finasteride .  These are modestly effective.  He has 3 episodes of nocturia nightly which is tolerable for him.  No symptoms of urine retention right now.  No other systemic symptoms to suggest urinary tract infection.  Point-of-care urinalysis is normal with no pyuria.  I do not think he has either cystitis or pyelonephritis.  I recommended continuing medical management for prostate enlargement.  He had some testicular discomfort that is intermittent, scrotal exam is normal and I gave reassurance.  I think the back discomfort is mostly coming from his osteoarthritis and lumbar spine disease with recent postsurgical infection.  Overall though he seems to be recovering from that nicely.

## 2023-10-06 NOTE — Patient Instructions (Signed)
  VISIT SUMMARY: Today, we discussed your back pain and burning sensation when urinating. We also reviewed your prostate health and occasional testicular pain.  YOUR PLAN: -BACK PAIN: Your back pain is likely related to existing back issues and not a kidney or urinary tract infection. We will check your urine for any signs of inflammation or bacteria to rule out a urinary tract infection.  -DYSURIA: The burning sensation when you urinate is likely related to your prostate issues and not an infection. Since you are already on doxycycline , the likelihood of an infection is reduced. Please make sure to stay hydrated and try to completely empty your bladder when you urinate.  -BENIGN PROSTATIC HYPERPLASIA (BPH): BPH means you have an enlarged prostate, which can cause urinary symptoms. Continue taking Flomax  and finasteride  daily as prescribed to manage this condition.  -INTERMITTENT TESTICULAR PAIN: Your occasional testicular pain does not seem to be related to any masses or other issues. If the pain becomes persistent, please return for further evaluation.  INSTRUCTIONS: We will check your urine for any signs of inflammation or bacteria to rule out a urinary tract infection. Please follow up with your doctor next week regarding your staph infection.

## 2023-10-06 NOTE — Progress Notes (Signed)
 Acute Office Visit  Subjective:     Patient ID: [name] [name], male    DOB: 25-Apr-1945, 78 y.o.   MRN: [id number]  Chief Complaint  Patient presents with   Urinary Tract Infection    HPI  Discussed the use of AI scribe software for clinical note transcription with the patient, who gave verbal consent to proceed.  History of Present Illness [name] [name] is a 78 year old male with prostate enlargement who presents with back pain and dysuria.  He has been experiencing back pain primarily on the right side for the past three to four days. He describes a burning sensation when urinating and is concerned about the possibility of a kidney infection. The burning sensation is mild, described as a 'little sting'. No fever, nausea, or changes in appetite.  He has a history of prostate enlargement, previously treated with radiation therapy. He takes Flomax  once daily and finasteride  daily for prostate management. He describes having a 'great big prostate' and experiences a weak urinary stream and incomplete bladder emptying. He urinates two to three times at night, which he considers normal.  He is currently on doxycycline  for a staph infection on his back, with about two weeks remaining on the medication. He is scheduled to follow up with his doctor next week regarding this infection.  He mentions occasional testicular pain that 'comes and goes', but he does not currently experience any pain.        Objective:    BP (!) 111/55 (BP Location: Left Arm, Patient Position: Sitting, Cuff Size: Normal)   Pulse 92   Temp 98 F (36.7 C) (Oral)   Ht 5' 8 (1.727 m)   Wt 187 lb (84.8 kg)   SpO2 96%   BMI 28.43 kg/m    Physical Exam  Gen: Well-appearing man Back: Midline incision on his lumbar spine is healing well.  Only 1 small area that has not yet approximated.  There is no purulence, normal surrounding erythema, no signs of active infection.  There is no tenderness over the  SI joints, no pain with percussion of the costophrenic angles. Abd: Soft, no tenderness with palpation over the bladder Genitals: Normal penis, normal scrotum, normal testicles, normal epididymis, no hydrocele or varicocele, no discomfort on palpation of the testicles.     Assessment & Plan:   Problem List Items Addressed This Visit       Unprioritized   Lower urinary tract symptoms (LUTS) - Primary   Acute on chronic issue of a mild sensation of dysuria, and some discomfort at his low back.  He wanted an evaluation for a urinary tract infection.  He has not had a UTI in the past.  Does have a history of prostate cancer and prostate enlargement.  This was treated with radiation therapy.  He is on medical management for lower urinary tract symptoms with Flomax  and finasteride .  These are modestly effective.  He has 3 episodes of nocturia nightly which is tolerable for him.  No symptoms of urine retention right now.  No other systemic symptoms to suggest urinary tract infection.  Point-of-care urinalysis is normal with no pyuria.  I do not think he has either cystitis or pyelonephritis.  I recommended continuing medical management for prostate enlargement.  He had some testicular discomfort that is intermittent, scrotal exam is normal and I gave reassurance.  I think the back discomfort is mostly coming from his osteoarthritis and lumbar spine disease with recent postsurgical infection.  Overall though he seems to be recovering from that nicely.      Return if symptoms worsen or fail to improve.  Cleatus Debby Specking, MD

## 2023-10-06 NOTE — Addendum Note (Signed)
 Addended by: GALA GALLEY on: 10/06/2023 11:19 AM   Modules accepted: Orders

## 2023-10-07 LAB — URINE CULTURE
MICRO NUMBER:: 16689223
Result:: NO GROWTH
SPECIMEN QUALITY:: ADEQUATE

## 2023-10-13 ENCOUNTER — Ambulatory Visit: Admitting: Infectious Diseases

## 2023-10-13 ENCOUNTER — Other Ambulatory Visit: Payer: Self-pay

## 2023-10-13 VITALS — BP 119/69 | HR 91 | Temp 97.7°F | Wt 188.0 lb

## 2023-10-13 DIAGNOSIS — B957 Other staphylococcus as the cause of diseases classified elsewhere: Secondary | ICD-10-CM

## 2023-10-13 DIAGNOSIS — T8140XD Infection following a procedure, unspecified, subsequent encounter: Secondary | ICD-10-CM | POA: Diagnosis not present

## 2023-10-13 DIAGNOSIS — T8140XA Infection following a procedure, unspecified, initial encounter: Secondary | ICD-10-CM | POA: Insufficient documentation

## 2023-10-13 DIAGNOSIS — Z79899 Other long term (current) drug therapy: Secondary | ICD-10-CM | POA: Diagnosis not present

## 2023-10-13 DIAGNOSIS — Z981 Arthrodesis status: Secondary | ICD-10-CM

## 2023-10-13 NOTE — Progress Notes (Signed)
 Patient Active Problem List   Diagnosis Date Noted   Postoperative infection 10/13/2023   Medication management 10/13/2023   Lower urinary tract symptoms (LUTS) 10/06/2023   Surgical site infection 09/19/2023   Unintentional weight loss 09/19/2023   Myelosuppression 09/19/2023   Postoperative wound breakdown 08/29/2023   S/P lumbar fusion 08/07/2023   Foreign body in right ear 06/09/2023   Impacted cerumen of left ear 06/09/2023   Fatigue 08/17/2022   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   Hx of colonic polyps 01/10/2022   Atrial fibrillation, transient (HCC) 11/02/2021   Hypotension    Chest pain 10/28/2021   Prostate cancer (HCC) 04/02/2021   Chronic HFrEF (heart failure with reduced ejection fraction) (HCC) 05/20/2020   Claudication (HCC) 09/01/2019   S/P cervical spinal fusion 04/20/2018   Paresthesia 01/10/2018   Neck pain 01/10/2018   LBBB (left bundle branch block) 09/07/2017   Other meniscus derangements, posterior horn of medial meniscus, left knee, insufficency fracture medial tibial plateau 04/07/2017   Closed nondisplaced fracture of lateral condyle of left tibia with routine healing 04/07/2017   Status post arthroscopy of left knee 04/07/2017   Insomnia 09/14/2015   Hyperlipidemia with target LDL less than 100 09/11/2014   Rotator cuff tendinitis 04/28/2014   Obesity (BMI 30-39.9) 12/29/2013   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   Exertional shortness of breath 06/26/2012   Polyp of colon, adenomatous 04/27/2012   Elevated PSA 10/20/2011   Hypertriglyceridemia 07/08/2011   Nonischemic cardiomyopathy (HCC) 09/04/2009    Patient's Medications  New Prescriptions   No medications on file  Previous Medications   ACETAMINOPHEN  (TYLENOL ) 500 MG TABLET    Take 1,000 mg by mouth every 6 (six) hours as needed for headache.   ASPIRIN  EC 81 MG TABLET    Take 1 tablet (81 mg total) by mouth daily.   COLCHICINE  0.6 MG TABLET     TAKE 1 TABLET TWICE DAILY   CYANOCOBALAMIN (VITAMIN B-12 PO)    Take 1,000 mcg by mouth in the morning.   DOXYCYCLINE  (VIBRAMYCIN ) 100 MG CAPSULE    Take 1 capsule (100 mg total) by mouth 2 (two) times daily.   FINASTERIDE  (PROSCAR ) 5 MG TABLET    Take 5 mg by mouth at bedtime.   GABAPENTIN  (NEURONTIN ) 300 MG CAPSULE    Take 1-2 capsules (300-600 mg total) by mouth 3 (three) times daily as needed (pain).   HYDRALAZINE  (APRESOLINE ) 25 MG TABLET    Take 1 tablet (25 mg total) by mouth in the morning and at bedtime.   MAGNESIUM  HYDROXIDE (DULCOLAX PO)    Take 2 tablets by mouth 2 (two) times daily as needed.   MAGNESIUM  PO    Take 400 mg by mouth in the morning.   METHOCARBAMOL  (ROBAXIN ) 500 MG TABLET    Take 1 tablet (500 mg total) by mouth every 6 (six) hours as needed for muscle spasms.   MULTIPLE VITAMIN (MULTIVITAMIN WITH MINERALS) TABS TABLET    Take 1 tablet by mouth in the morning. One-A-Day Men Health Multivitamin   OMEGA-3 FATTY ACIDS (FISH OIL PO)    Take 1,000 mg by mouth in the morning.   ROSUVASTATIN  (CRESTOR ) 5 MG TABLET    Take 1 tablet (5 mg total) by mouth every Monday, Wednesday, and Friday.   TAMSULOSIN  (FLOMAX ) 0.4 MG CAPS CAPSULE    TAKE 1 CAPSULE EVERY DAY   TURMERIC (QC TUMERIC COMPLEX PO)    Take 1 tablet by mouth in the morning.  ZINC  SULFATE, 50MG  ELEMENTAL ZINC , 220 (50 ZN) MG CAPSULE    Take 220 mg by mouth in the morning.  Modified Medications   No medications on file  Discontinued Medications   No medications on file    Subjective: Discussed the use of AI scribe software for clinical note transcription with the patient, who gave verbal consent to proceed.   78 Y O Male with h/o CHF, BPH, Gout, HLD, Non ischemic cardiomyopathy s/p ICD, arthritis, A fib, Prostate ca who had decompressive lumbar laminectomy, PLIF L4-L5 and instrumented posterior fixation complicated by post operative wound infection requiring wound washout on 6/23 ( OR cx growing MR staph  lugdunensis) who is here for fu. Initially completed 2 weeks of PO linezolid  which was then transitioned to PO doxycycline  for 4 weeks during last clinic visit with Dr Luiz for concerns of deep wound infection.   7/14 Reports completing course of PO linezolid  and currently on PO doxycycline   without missing doses or concerns and still has few pills left. Posterior wound has healed. Denies drainage. Denies fevers, chills. Denies nausea, vomiting or diarrhea. Has fu with Dr Joshua in July 29th. He is eager to finish off his antibiotics. Recently seen by Cardiology on 7/3 for ICD/CHF and PCP on 7/11 for LUTs. No other complaints.   Review of Systems: all systems reviewed with pertinent positives and negatives as listed above   Past Medical History:  Diagnosis Date   Arthritis    BPH (benign prostatic hypertrophy)    CHF (congestive heart failure) (HCC)    Elevated PSA    Eye problem 03/06/2016   Dr. 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  Elevated LVEDP.;  09/2020: EF 20-25%.  GR 1 DD   Numbness    hands   Personal history of arthritis    Personal history of other diseases of circulatory system    Pre-diabetes    Sickle cell anemia (HCC)    pt denies   Past Surgical History:  Procedure Laterality Date   ANTERIOR CERVICAL DECOMP/DISCECTOMY FUSION N/A 04/20/2018   Procedure: Anterior Cervical Decompression Fusion - Cervical Three-Cervical Four - Cervical Four-Cervical Five - Cervical Five-Cervical Six;  Surgeon: Joshua Alm RAMAN, MD;   Location: [facility] OR;  Service: Neurosurgery;  Laterality: N/A;  Anterior Cervical Decompression Fusion - Cervical Three-Cervical Four - Cervical Four-Cervical Five - Cervical Five-Cervical Six   APPENDECTOMY  AS CHILD   BACK SURGERY     BIV ICD INSERTION CRT-D N/A 10/27/2021   Procedure: BIV ICD INSERTION CRT-D;  Surgeon: Waddell Danelle ORN, MD;  Location: [facility] INVASIVE CV LAB;  Service: Cardiovascular;  Laterality: N/A;   CARDIOVASCULAR STRESS TEST  10/05/1998   MILD GLOBAL HYPOKINESIS AND ISCHEMIAIN ANTEROSEPTAL  AT [facility]X/ EF 42%   CARPAL TUNNEL RELEASE Bilateral    CENTRAL LINE INSERTION  10/28/2021   Procedure: CENTRAL LINE INSERTION;  Surgeon: Inocencio Soyla Lunger, MD;  Location: [facility] INVASIVE CV LAB;  Service: Cardiovascular;;   CENTRAL LINE INSERTION  10/28/2021  Procedure: CENTRAL LINE INSERTION;  Surgeon: Wonda Sharper, MD;  Location: [facility] INVASIVE CV LAB;  Service: Cardiovascular;;   COLONOSCOPY WITH PROPOFOL  N/A 03/14/2022   Procedure: COLONOSCOPY WITH PROPOFOL ;  Surgeon: Shila Gustav GAILS, MD;  Location: [facility] ENDOSCOPY;  Service: Gastroenterology;  Laterality: N/A;   EXCISION MELANOMA LEFT UPPER BACK  10/14/2009   eye lid surgery Bilateral 2018   GOLD SEED IMPLANT N/A 07/23/2021   Procedure: GOLD SEED IMPLANT;  Surgeon: Cam Morene ORN, MD;  Location: [facility] ORS;  Service: Urology;  Laterality: N/A;   KNEE ARTHROSCOPY WITH SUBCHONDROPLASTY Left 04/07/2017   Procedure: LEFT KNEE ARTHROSCOPY WITH PARTIAL MEDIAL MENISCECTOMY AND MEDIAL TIBIAL SUBCHONDROPLASTY;  Surgeon: Vernetta Lonni GRADE, MD;  Location: [facility] ORS;  Service: Orthopedics;  Laterality: Left;   knee injections Bilateral    LEAD REVISION/REPAIR N/A 10/28/2021   Procedure: LEAD REVISION/REPAIR;  Surgeon: Inocencio Soyla Lunger, MD;  Location: [facility] INVASIVE CV LAB;  Service: Cardiovascular;  Laterality: N/A;   LEFT HEART CATH AND CORONARY ANGIOGRAPHY  04/2002   minimal irregularities in the LAD/  EF 50%  -   DR AL LITTLE   LUMBAR DISC  SURGERY  09/12/2000   left  L5 -- S1   LUMBAR WOUND DEBRIDEMENT N/A 08/29/2023   Procedure: LUMBAR WOUND IRRIGATION AND DEBRIDEMENT;  Surgeon: Lanis Pupa, MD;  Location: [facility] OR;  Service: Neurosurgery;  Laterality: N/A;  I & D Lumbar wound   PERICARDIOCENTESIS N/A 10/28/2021   Procedure: PERICARDIOCENTESIS;  Surgeon: Inocencio Soyla Lunger, MD;  Location: [name] INVASIVE CV LAB;  Service: Cardiovascular;  Laterality: N/A;   PERICARDIOCENTESIS N/A 10/28/2021   Procedure: PERICARDIOCENTESIS;  Surgeon: Wonda Sharper, MD;  Location: [facility] INVASIVE CV LAB;  Service: Cardiovascular;  Laterality: N/A;   PROSTATE BIOPSY N/A 05/22/2013   Procedure: BIOPSY TRANSRECTAL ULTRASONIC PROSTATE (TUBP);  Surgeon: Alm GORMAN Fragmin, MD;  Location: [facility];  Service: Urology;  Laterality: N/A;   RIGHT/LEFT HEART CATH AND CORONARY ANGIOGRAPHY N/A 06/25/2020   Procedure: RIGHT/LEFT HEART CATH AND CORONARY ANGIOGRAPHY;  Surgeon: Claudene Victory ORN, MD;  Location: [facility] INVASIVE CV LAB;:; Normal coronary arteries; LVEDP 23 mmHg; PCWP 19 mmHg.   ROTATOR CUFF REPAIR Left 2016   SPACE OAR INSTILLATION N/A 07/23/2021   Procedure: SPACE OAR INSTILLATION;  Surgeon: Cam Morene ORN, MD;  Location: [facility] ORS;  Service: Urology;  Laterality: N/A;   THROAT SURGERY  04/20/2018   TRANSRECTAL ULTRASOUND PROSTATE BX  05-23-2005  &  04-19-2001   TRANSTHORACIC ECHOCARDIOGRAM  09/2017   EF 45-50 % (previously reported as 40 to 45%).  Incoordinate septal motion with mild LVH.  GR 1 DD.  Aortic sclerosis but no stenosis.   TRANSTHORACIC ECHOCARDIOGRAM  10/06/2020   Severely reduced EF of 20 to 25%.  No LV thrombus.  Severe septal-lateral wall systolic dyssynchrony due to LBBB.  Global HK.  Moderately dilated LV.  GR 1 DD with mild LA dilation..  Unable to assess PAP with normal RV size and function.  Normal RAP.  Mild AOV sclerosis but no stenosis.  Mild to moderate MR.   TRANSTHORACIC ECHOCARDIOGRAM  06/16/2020   Severely  reduced EF <20%.  Moderate severely dilated LV.  GR 2 DD.  Elevated LVEDP.  Mild LA dilation.  Mildly reduced RV function.  Mild MR.  Mild aortic valve calcification    Social History   Tobacco Use   Smoking status: Former    Current packs/day: 0.00    Average packs/day: 2.0 packs/day for 20.0 years (40.0 ttl pk-yrs)  Types: Cigarettes    Start date: 03/28/1960    Quit date: 03/28/1980    Years since quitting: 43.5   Smokeless tobacco: Never  Vaping Use   Vaping status: Never Used  Substance Use Topics   Alcohol  use: No    Alcohol /week: 0.0 standard drinks of alcohol    Drug use: No    Family History  Problem Relation Age of Onset   Arthritis Mother    COPD Father    Lung cancer Father    Cancer Sister        type unknown   Breast cancer Sister    Lung cancer Sister    Cardiomyopathy Other        idiopathic. trivial disease 2004 cath, 2010 cardiac CT - no sig. dz, nl LV fxn. cards: Little   Colon cancer Neg Hx     Allergies  Allergen Reactions   Nitrostat [Nitroglycerin] Anaphylaxis    Cardiologist suggested he shouldn't take this med because it could kill him with his heart condition. Lowers BP   Vancomycin  Hives    08/29/23 per confirmation with RN, shortly after starting vancomycin  infusion patient developed itching and a rash on his torso   Ace Inhibitors Swelling   Mevacor  [Lovastatin ] Nausea Only   Solarcaine [Benzocaine] Dermatitis    Health Maintenance  Topic Date Due   Hepatitis B Vaccines (2 of 3 - 19+ 3-dose series) 02/11/2011   Zoster Vaccines- Shingrix (2 of 2) 07/28/2021   COVID-19 Vaccine (6 - 2024-25 season) 11/27/2022   Medicare Annual Wellness (AWV)  12/01/2023   INFLUENZA VACCINE  10/27/2023   Colonoscopy  03/15/2027   DTaP/Tdap/Td (3 - Td or Tdap) 06/05/2031   Pneumococcal Vaccine: 50+ Years  Completed   Hepatitis C Screening  Completed   HPV VACCINES  Aged Out   Meningococcal B Vaccine  Aged Out    Objective:  Vitals:   10/13/23 1043   BP: 119/69  Pulse: 91  Temp: 97.7 F (36.5 C)  TempSrc: Oral  SpO2: 96%  Weight: 188 lb (85.3 kg)   Body mass index is 28.59 kg/m.  Physical Exam Constitutional:      Appearance: Normal appearance.  HENT:     Head: Normocephalic and atraumatic.      Mouth: Mucous membranes are moist.  Eyes:    Conjunctiva/sclera: Conjunctivae normal.     Pupils: Pupils are equal, round, and b/l symmetrical    Cardiovascular:     Rate and Rhythm: Normal rate and regular rhythm.     Heart sounds: s1s2  Pulmonary:     Effort: Pulmonary effort is normal.     Breath sounds: Normal breath sounds.   Abdominal:     General: Non distended     Palpations: soft.   Musculoskeletal:        General: Normal range of motion.   Skin:    General: Skin is warm and dry.    Comments: posterior lumbar wound has healed, no signs of infection    Neurological:     General: grossly non focal     Mental Status: awake, alert and oriented to person, place, and time.   Psychiatric:        Mood and Affect: Mood normal.   Lab Results Lab Results  Component Value Date   WBC 5.9 10/03/2023   HGB 8.7 Repeated and verified X2. (L) 10/03/2023   HCT 26.3 Repeated and verified X2. (L) 10/03/2023   MCV 85.7 10/03/2023   PLT 208.0 10/03/2023  Lab Results  Component Value Date   CREATININE 1.18 10/03/2023   BUN 25 (H) 10/03/2023   NA 137 10/03/2023   K 4.2 10/03/2023   CL 104 10/03/2023   CO2 25 10/03/2023    Lab Results  Component Value Date   ALT 20 09/19/2023   AST 20 09/19/2023   ALKPHOS 77 09/19/2023   BILITOT 0.4 09/19/2023    Lab Results  Component Value Date   CHOL 163 07/12/2023   HDL 42.60 07/12/2023   LDLCALC 82 07/12/2023   TRIG 193.0 (H) 07/12/2023   CHOLHDL 4 07/12/2023   No results found for: LABRPR, RPRTITER No results found for: HIV1RNAQUANT, HIV1RNAVL, CD4TABS   Microbiology Results for orders placed or performed in visit on 10/06/23  Urine Culture      Status: None   Collection Time: 10/06/23  2:39 PM   Specimen: Urine  Result Value Ref Range Status   MICRO NUMBER: [id number]  Final   SPECIMEN QUALITY: Adequate  Final   Sample Source URINE  Final   STATUS: FINAL  Final   Result: No Growth  Final   Imaging DG Chest 2 View Result Date: 09/22/2023 CLINICAL DATA:  Elevated right ventricular lead impedance. EXAM: CHEST - 2 VIEW COMPARISON:  March 07, 2023. FINDINGS: The heart size and mediastinal contours are within normal limits. Left-sided defibrillator and leads are grossly unchanged in position. Both lungs are clear. The visualized skeletal structures are unremarkable. IMPRESSION: Stable appearance and position of left-sided defibrillator. Electronically Signed   By: Lynwood Landy Raddle M.D.   On: 09/22/2023 16:06    Assessment/Plan # Post op lumbar wound infection  - 6/3 s/p washout, OR cx staph lugdunensis. Per OR note  some thin yellow fluid encountered/no obvious fascial dehiscence. - Initially thought to be superficial and planned for 2 weeks course of PO linezolid  but extended course of antibiotics last visit to 4 more weeks of PO doxycycline    Plan - ESR and CRP today - 7/8 CBC and BMP reviewed and discussed  - Continue po doxycycline  pending labs today - Fu to be made pending labs - Fu with Neurosurgery as instructed  I spent 40  minutes involved in face-to-face and non-face-to-face activities for this patient on the day of the visit. Professional time spent includes the following activities: Preparing to see the patient (review of tests), Obtaining and reviewing separately obtained history (discharge record 6/9, OR note 6/3 and Dr Junior last few notes, last PCP and Cardiology note), Performing a medically appropriate examination and evaluation , Ordering labs, Documenting clinical information in the EMR, Independently interpreting results (not separately reported), Communicating results to the patient, Counseling and educating  the patient and Care coordination (not separately reported).   Of note, portions of this note may have been created with voice recognition software. While this note has been edited for accuracy, occasional wrong-word or 'sound-a-like' substitutions may have occurred due to the inherent limitations of voice recognition software.   [name] Joseph, MD [facility] 10/13/2023, 11:02 AM

## 2023-10-14 LAB — C-REACTIVE PROTEIN: CRP: <3 mg/L (ref <8.0)

## 2023-10-14 LAB — SEDIMENTATION RATE: Sed Rate: 28 mm/h — ABNORMAL HIGH (ref 0–20)

## 2023-10-16 ENCOUNTER — Ambulatory Visit: Payer: Self-pay | Admitting: Infectious Diseases

## 2023-10-16 NOTE — Telephone Encounter (Signed)
 Spoke with patient regarding results. No questions at this time. Scheduled for follow up with Dr. Dea 8/5. Lorenda CHRISTELLA Code, RMA

## 2023-10-16 NOTE — Telephone Encounter (Signed)
-----   Message from Annalee Joseph sent at 10/16/2023 10:34 AM EDT ----- Please let him know his inflammatory markers ESR is better and CRP is normal  Complete the course of doxycycline  as ordered  and then fu with either Dr Luiz or myself 2-3 weeks after completion of doxycycline .  ----- Message ----- From: Rebecka Hose Lab Results In Sent: 10/13/2023  11:23 PM EDT To: Annalee Orem, MD

## 2023-10-18 ENCOUNTER — Encounter: Payer: Self-pay | Admitting: Family Medicine

## 2023-10-18 ENCOUNTER — Ambulatory Visit (INDEPENDENT_AMBULATORY_CARE_PROVIDER_SITE_OTHER): Admitting: Family Medicine

## 2023-10-18 VITALS — BP 118/60 | HR 84 | Temp 98.0°F | Ht 68.0 in | Wt 188.4 lb

## 2023-10-18 DIAGNOSIS — R5381 Other malaise: Secondary | ICD-10-CM

## 2023-10-18 DIAGNOSIS — I5022 Chronic systolic (congestive) heart failure: Secondary | ICD-10-CM

## 2023-10-18 DIAGNOSIS — G8929 Other chronic pain: Secondary | ICD-10-CM

## 2023-10-18 DIAGNOSIS — M79604 Pain in right leg: Secondary | ICD-10-CM

## 2023-10-18 DIAGNOSIS — R42 Dizziness and giddiness: Secondary | ICD-10-CM | POA: Diagnosis not present

## 2023-10-18 DIAGNOSIS — H538 Other visual disturbances: Secondary | ICD-10-CM | POA: Diagnosis not present

## 2023-10-18 DIAGNOSIS — R399 Unspecified symptoms and signs involving the genitourinary system: Secondary | ICD-10-CM | POA: Diagnosis not present

## 2023-10-18 DIAGNOSIS — E781 Pure hyperglyceridemia: Secondary | ICD-10-CM | POA: Diagnosis not present

## 2023-10-18 DIAGNOSIS — G6289 Other specified polyneuropathies: Secondary | ICD-10-CM

## 2023-10-18 DIAGNOSIS — M542 Cervicalgia: Secondary | ICD-10-CM

## 2023-10-18 DIAGNOSIS — M545 Low back pain, unspecified: Secondary | ICD-10-CM | POA: Diagnosis not present

## 2023-10-18 DIAGNOSIS — R7303 Prediabetes: Secondary | ICD-10-CM

## 2023-10-18 DIAGNOSIS — M79605 Pain in left leg: Secondary | ICD-10-CM

## 2023-10-18 LAB — COMPREHENSIVE METABOLIC PANEL WITH GFR
ALT: 14 U/L (ref 0–53)
AST: 15 U/L (ref 0–37)
Albumin: 4.5 g/dL (ref 3.5–5.2)
Alkaline Phosphatase: 78 U/L (ref 39–117)
BUN: 29 mg/dL — ABNORMAL HIGH (ref 6–23)
CO2: 26 meq/L (ref 19–32)
Calcium: 9.5 mg/dL (ref 8.4–10.5)
Chloride: 105 meq/L (ref 96–112)
Creatinine, Ser: 1.16 mg/dL (ref 0.40–1.50)
GFR: 59.92 mL/min — ABNORMAL LOW (ref >60.00)
Glucose, Bld: 113 mg/dL — ABNORMAL HIGH (ref 70–99)
Potassium: 4.5 meq/L (ref 3.5–5.1)
Sodium: 138 meq/L (ref 135–145)
Total Bilirubin: 0.3 mg/dL (ref 0.2–1.2)
Total Protein: 7.6 g/dL (ref 6.0–8.3)

## 2023-10-18 LAB — CBC
HCT: 32.2 % — ABNORMAL LOW (ref 39.0–52.0)
Hemoglobin: 10.2 g/dL — ABNORMAL LOW (ref 13.0–17.0)
MCHC: 31.8 g/dL (ref 30.0–36.0)
MCV: 85.8 fl (ref 78.0–100.0)
Platelets: 154 K/uL (ref 150.0–400.0)
RBC: 3.75 Mil/uL — ABNORMAL LOW (ref 4.22–5.81)
RDW: 16.9 % — ABNORMAL HIGH (ref 11.5–15.5)
WBC: 6.2 K/uL (ref 4.0–10.5)

## 2023-10-18 LAB — LIPID PANEL
Cholesterol: 121 mg/dL (ref 0–200)
HDL: 46.8 mg/dL (ref >39.00)
LDL Cholesterol: 58 mg/dL (ref 0–99)
NonHDL: 74.26
Total CHOL/HDL Ratio: 3
Triglycerides: 83 mg/dL (ref 0.0–149.0)
VLDL: 16.6 mg/dL (ref 0.0–40.0)

## 2023-10-18 LAB — HEMOGLOBIN A1C: Hgb A1c MFr Bld: 5.9 % (ref 4.6–6.5)

## 2023-10-18 MED ORDER — TAMSULOSIN HCL 0.4 MG PO CAPS: 0.4000 mg | ORAL_CAPSULE | Freq: Every day | ORAL | 3 refills | Status: AC

## 2023-10-18 MED ORDER — GABAPENTIN 300 MG PO CAPS: 300.0000 mg | ORAL_CAPSULE | Freq: Three times a day (TID) | ORAL | 1 refills | Status: DC | PRN

## 2023-10-18 NOTE — Patient Instructions (Addendum)
 I recommend discussing your episode last weekend with your cardiologist. I do recommend emergency room evaluation if those symptoms return. I am checking some labs today.   Thank you for coming in today. No change in medications at this time. If there are any concerns on your bloodwork, I will let you know. Take care!

## 2023-10-18 NOTE — Progress Notes (Signed)
 Subjective:  Patient ID: [name] [name], male    DOB: 1945/06/28  Age: 78 y.o. MRN: [id number]  CC:  Chief Complaint  Patient presents with   Medical Management of Chronic Issues    Pt is doing okay   Blurred Vision    Pt notes episode on Sunday of blurred vision, dizziness last about 4 hours and latter in the day had some ear pain, none of these have returned since Sunday     HPI [name] [name] presents for   Blurred vision Episode 4 days ago. Noticed when putting on his boots. Felt weak all over, no slurred speech. No focal weakness,  dizziness, blurred vision, both eyes, lasting about 4 hours followed by some ear pain later in the day then resolved and have not recurred since 3 days ago.  He did note some brief symptoms at his last visit July 2 that had temporarily occurred in the morning then resolved.  No current ear pain. No recurrence. No associated CP/palpitations. Did not check blood pressure at the time. No change in fluid intake, eating well.  Able to walk a mile yesterday, active, felt well.   Chronic systolic heart failure Recent visit with Dr. Waddell noted on July 3.  Class II, continue on current meds, has ICD.  Not dependent, pseudo adaptive pacing on with RV subthreshold.  Watchful waiting.  If worsening heart block or shocking impedance goes up, new ICD lead would be required.  1 year follow-up planned. Cardiologist Dr. Anner, treated with hydralazine  25 mg twice daily  Chronic Neck/back pain with radiculopathy  Followed by neurosurgery. Overall stable recently. Gabapentin  2 times per day - stable. No new side effects.   Hyperlipidemia: Crestor  5 mg daily. No new myalgias.  Lab Results  Component Value Date   CHOL 163 07/12/2023   HDL 42.60 07/12/2023   LDLCALC 82 07/12/2023   TRIG 193.0 (H) 07/12/2023   CHOLHDL 4 07/12/2023   Lab Results  Component Value Date   ALT 20 09/19/2023   AST 20 09/19/2023   ALKPHOS 77 09/19/2023   BILITOT 0.4  09/19/2023   BPH with LUTS Treated with Flomax  0.4 mg daily.  Recent visit with my colleague 10/06/23 for lower urinary tract symptoms, thought to be related to his BPH.  Urine culture no growth. Stable urinary symptoms.   Prediabetes:  with borderline diabetic reading in April. Has been trying to avoid sugary foods.  Lab Results  Component Value Date   HGBA1C 6.5 07/12/2023   Wt Readings from Last 3 Encounters:  10/18/23 188 lb 6.4 oz (85.5 kg)  10/13/23 188 lb (85.3 kg)  10/06/23 187 lb (84.8 kg)         History Patient Active Problem List   Diagnosis Date Noted   Postoperative infection 10/13/2023   Medication management 10/13/2023   Lower urinary tract symptoms (LUTS) 10/06/2023   Surgical site infection 09/19/2023   Unintentional weight loss 09/19/2023   Myelosuppression 09/19/2023   Postoperative wound breakdown 08/29/2023   S/P lumbar fusion 08/07/2023   Foreign body in right ear 06/09/2023   Impacted cerumen of left ear 06/09/2023   Fatigue 08/17/2022   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   Hx of colonic polyps 01/10/2022   Atrial fibrillation, transient (HCC) 11/02/2021   Hypotension    Chest pain 10/28/2021   Prostate cancer (HCC) 04/02/2021   Chronic HFrEF (heart failure with reduced ejection fraction) (HCC) 05/20/2020   Claudication (HCC) 09/01/2019   S/P cervical  spinal fusion 04/20/2018   Paresthesia 01/10/2018   Neck pain 01/10/2018   LBBB (left bundle branch block) 09/07/2017   Other meniscus derangements, posterior horn of medial meniscus, left knee, insufficency fracture medial tibial plateau 04/07/2017   Closed nondisplaced fracture of lateral condyle of left tibia with routine healing 04/07/2017   Status post arthroscopy of left knee 04/07/2017   Insomnia 09/14/2015   Hyperlipidemia with target LDL less than 100 09/11/2014   Rotator cuff tendinitis 04/28/2014   Obesity (BMI 30-39.9) 12/29/2013   Osteoarthritis of  cervical spine 12/19/2013   Exertional shortness of breath 06/26/2012   Polyp of colon, adenomatous 04/27/2012   Elevated PSA 10/20/2011   Hypertriglyceridemia 07/08/2011   Nonischemic cardiomyopathy (HCC) 09/04/2009   Past Medical History:  Diagnosis Date   Arthritis    BPH (benign prostatic hypertrophy)    CHF (congestive heart failure) (HCC)    Elevated PSA    Eye problem 03/06/2016   Dr. 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  Elevated LVEDP.;  09/2020: EF 20-25%.  GR 1 DD   Numbness    hands   Personal history of arthritis    Personal history of other diseases of circulatory system    Pre-diabetes    Sickle cell anemia (HCC)    pt denies   Past Surgical History:  Procedure Laterality Date   ANTERIOR CERVICAL DECOMP/DISCECTOMY FUSION N/A 04/20/2018   Procedure: Anterior Cervical Decompression Fusion - Cervical Three-Cervical Four - Cervical Four-Cervical Five - Cervical Five-Cervical Six;  Surgeon: Joshua Alm RAMAN, MD;  [facility];  Service: Neurosurgery;  Laterality: N/A;  Anterior Cervical Decompression Fusion - Cervical Three-Cervical Four - Cervical Four-Cervical Five - Cervical Five-Cervical Six   APPENDECTOMY  AS CHILD   BACK SURGERY     BIV ICD INSERTION CRT-D N/A 10/27/2021   Procedure: BIV ICD INSERTION CRT-D;  Surgeon: Waddell Danelle ORN, MD;  Location: [facility] INVASIVE CV LAB;  Service: Cardiovascular;  Laterality: N/A;   CARDIOVASCULAR STRESS TEST  10/05/1998   MILD GLOBAL HYPOKINESIS AND ISCHEMIAIN ANTEROSEPTAL  AT  APEX/ EF 42%   CARPAL TUNNEL RELEASE Bilateral    CENTRAL LINE INSERTION  10/28/2021   Procedure: CENTRAL LINE INSERTION;  Surgeon: Inocencio Soyla Lunger, MD;  Location: [facility] INVASIVE CV LAB;  Service: Cardiovascular;;   CENTRAL LINE INSERTION  10/28/2021   Procedure: CENTRAL LINE INSERTION;  Surgeon: Wonda Sharper, MD;  Location: [facility] INVASIVE CV LAB;  Service: Cardiovascular;;   COLONOSCOPY WITH PROPOFOL  N/A 03/14/2022   Procedure: COLONOSCOPY WITH PROPOFOL ;  Surgeon: Shila Gustav GAILS, MD;  Location: [facility] ENDOSCOPY;  Service: Gastroenterology;  Laterality: N/A;   EXCISION MELANOMA LEFT UPPER BACK  10/14/2009   eye lid surgery Bilateral 2018   GOLD SEED IMPLANT N/A 07/23/2021   Procedure: GOLD SEED IMPLANT;  Surgeon: Cam Morene ORN, MD;  Location: [facility] ORS;  Service: Urology;  Laterality: N/A;   KNEE ARTHROSCOPY WITH SUBCHONDROPLASTY Left 04/07/2017   Procedure: LEFT KNEE ARTHROSCOPY WITH PARTIAL MEDIAL MENISCECTOMY AND MEDIAL TIBIAL  SUBCHONDROPLASTY;  Surgeon: Vernetta Lonni GRADE, MD;  Location: [facility] ORS;  Service: Orthopedics;  Laterality: Left;   knee injections Bilateral    LEAD REVISION/REPAIR N/A 10/28/2021   Procedure: LEAD REVISION/REPAIR;  Surgeon: Inocencio Soyla Lunger, MD;  Location: [facility] INVASIVE CV LAB;  Service: Cardiovascular;  Laterality: N/A;   LEFT HEART CATH AND CORONARY ANGIOGRAPHY  04/2002   minimal irregularities in the LAD/  EF 50%  -   DR AL LITTLE   LUMBAR DISC SURGERY  09/12/2000   left  L5 -- S1   LUMBAR WOUND DEBRIDEMENT N/A 08/29/2023   Procedure: LUMBAR WOUND IRRIGATION AND DEBRIDEMENT;  Surgeon: Lanis Pupa, MD;  Location: [facility] OR;  Service: Neurosurgery;  Laterality: N/A;  I & D Lumbar wound   PERICARDIOCENTESIS N/A 10/28/2021   Procedure: PERICARDIOCENTESIS;  Surgeon: Inocencio Soyla Lunger, MD;  Location: [facility] INVASIVE CV LAB;  Service: Cardiovascular;  Laterality: N/A;   PERICARDIOCENTESIS N/A 10/28/2021   Procedure: PERICARDIOCENTESIS;  Surgeon: Wonda Sharper, MD;  Location: [facility] INVASIVE CV LAB;  Service: Cardiovascular;  Laterality: N/A;   PROSTATE BIOPSY N/A 05/22/2013   Procedure: BIOPSY TRANSRECTAL ULTRASONIC PROSTATE (TUBP);  Surgeon: Alm GORMAN Fragmin, MD;  Location: [facility];  Service: Urology;  Laterality: N/A;   RIGHT/LEFT HEART CATH AND CORONARY ANGIOGRAPHY N/A 06/25/2020   Procedure: RIGHT/LEFT HEART CATH AND CORONARY ANGIOGRAPHY;  Surgeon: Claudene Victory ORN, MD;  Location: [facility] INVASIVE CV LAB;:; Normal coronary arteries; LVEDP 23 mmHg; PCWP 19 mmHg.   ROTATOR CUFF REPAIR Left 2016   SPACE OAR INSTILLATION N/A 07/23/2021   Procedure: SPACE OAR INSTILLATION;  Surgeon: Cam Morene ORN, MD;  Location: [facility] ORS;  Service: Urology;  Laterality: N/A;   THROAT SURGERY  04/20/2018   TRANSRECTAL ULTRASOUND PROSTATE BX  05-23-2005  &  04-19-2001   TRANSTHORACIC ECHOCARDIOGRAM  09/2017   EF 45-50 % (previously reported as 40 to 45%).  Incoordinate septal motion with mild LVH.  GR 1 DD.  Aortic sclerosis but no stenosis.   TRANSTHORACIC ECHOCARDIOGRAM  10/06/2020   Severely reduced EF of 20 to 25%.  No LV thrombus.  Severe septal-lateral wall systolic dyssynchrony due to LBBB.  Global HK.  Moderately dilated LV.  GR 1 DD with mild LA dilation..  Unable to assess PAP with normal RV size and function.  Normal RAP.  Mild AOV sclerosis but no stenosis.  Mild to moderate MR.   TRANSTHORACIC ECHOCARDIOGRAM  06/16/2020   Severely reduced EF <20%.  Moderate severely dilated LV.  GR 2 DD.  Elevated LVEDP.  Mild LA dilation.  Mildly reduced RV function.  Mild MR.  Mild aortic valve calcification   Allergies  Allergen Reactions   Nitrostat [Nitroglycerin] Anaphylaxis    Cardiologist suggested he shouldn't take this med because it could kill him with his heart condition. Lowers BP   Vancomycin  Hives    08/29/23 per confirmation with RN, shortly after starting vancomycin  infusion patient developed itching and a rash on his torso   Ace  Inhibitors Swelling   Mevacor  [Lovastatin ] Nausea Only   Solarcaine [Benzocaine] Dermatitis   Prior to Admission medications   Medication Sig Start Date End Date Taking? Authorizing Provider  acetaminophen  (TYLENOL ) 500 MG tablet Take 1,000 mg by mouth every 6 (six) hours as needed for headache.   Yes [provider]  aspirin  EC 81 MG tablet Take 1 tablet (81 mg total) by mouth daily. Patient taking differently: Take 81 mg by mouth in the morning. 04/26/18  Yes Costella,  Vincent J, PA-C  colchicine  0.6 MG tablet TAKE 1 TABLET TWICE DAILY Patient taking differently: Take 0.6 mg by mouth as needed. 08/25/23  Yes Levora Reyes SAUNDERS, MD  Cyanocobalamin (VITAMIN B-12 PO) Take 1,000 mcg by mouth in the morning.   Yes [provider]  doxycycline  (VIBRAMYCIN ) 100 MG capsule Take 1 capsule (100 mg total) by mouth 2 (two) times daily. 09/13/23  Yes Luiz Channel, MD  finasteride  (PROSCAR ) 5 MG tablet Take 5 mg by mouth at bedtime.   Yes [provider]  gabapentin  (NEURONTIN ) 300 MG capsule Take 1-2 capsules (300-600 mg total) by mouth 3 (three) times daily as needed (pain). Patient taking differently: Take 600 mg by mouth 2 (two) times daily. 02/27/23  Yes Levora Reyes SAUNDERS, MD  hydrALAZINE  (APRESOLINE ) 25 MG tablet Take 1 tablet (25 mg total) by mouth in the morning and at bedtime. Patient taking differently: Take 50 mg by mouth in the morning and at bedtime. 08/25/23  Yes Anner Alm ORN, MD  Magnesium  Hydroxide (DULCOLAX PO) Take 2 tablets by mouth 2 (two) times daily as needed.   Yes [provider]  MAGNESIUM  PO Take 400 mg by mouth in the morning.   Yes [provider]  methocarbamol  (ROBAXIN ) 500 MG tablet Take 1 tablet (500 mg total) by mouth every 6 (six) hours as needed for muscle spasms. 09/03/23  Yes Bergman, Meghan D, NP  Multiple Vitamin (MULTIVITAMIN WITH MINERALS) TABS tablet Take 1 tablet by mouth in the morning. One-A-Day Men Health Multivitamin    Yes [provider]  Omega-3 Fatty Acids (FISH OIL PO) Take 1,000 mg by mouth in the morning.   Yes [provider]  rosuvastatin  (CRESTOR ) 5 MG tablet Take 1 tablet (5 mg total) by mouth every Monday, Wednesday, and Friday. Patient taking differently: Take 5 mg by mouth every Monday, Wednesday, and Friday. At night 01/25/23 07/26/26 Yes West, Katlyn D, NP  tamsulosin  (FLOMAX ) 0.4 MG CAPS capsule TAKE 1 CAPSULE EVERY DAY Patient taking differently: Take 0.4 mg by mouth at bedtime. 08/04/22  Yes Levora Reyes SAUNDERS, MD  Turmeric (QC TUMERIC COMPLEX PO) Take 1 tablet by mouth in the morning.   Yes [provider]  zinc  sulfate, 50mg  elemental zinc , 220 (50 Zn) MG capsule Take 220 mg by mouth in the morning.   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  Tobacco Use   Smoking status: Former    Current packs/day: 0.00    Average packs/day: 2.0 packs/day for 20.0 years (40.0 ttl pk-yrs)    Types: Cigarettes    Start date: 03/28/1960    Quit date: 03/28/1980    Years since quitting: 43.5   Smokeless tobacco: Never  Vaping Use   Vaping status: Never Used  Substance and Sexual Activity   Alcohol  use: No    Alcohol /week: 0.0 standard drinks of alcohol    Drug use: No   Sexual activity: Not Currently  Other Topics Concern   Not on file  Social History Narrative   He walks on a relatively regular basis about 15 minutes at a time mostly to the discomfort. He previously had walked up a 30-40 minutes a time  without problems. Education: [employer].   Lives alone.   Right-handed.   One cup coffee daily.   Social Drivers of Corporate investment banker Strain: Low Risk  (12/01/2022)   Overall  Financial Resource Strain (CARDIA)    Difficulty of Paying Living Expenses: Not hard at all  Food Insecurity: No Food Insecurity (08/31/2023)   Hunger Vital Sign    Worried About Running Out of Food in the Last Year: Never true    Ran Out of Food in the Last Year: Never true  Transportation Needs: No Transportation Needs (08/31/2023)   PRAPARE - Administrator, Civil Service (Medical): No    Lack of Transportation (Non-Medical): No  Physical Activity: Sufficiently Active (12/01/2022)   Exercise Vital Sign    Days of Exercise per Week: 5 days    Minutes of Exercise per Session: 30 min  Stress: No Stress Concern Present (12/01/2022)   [employer] of Occupational Health - Occupational Stress Questionnaire    Feeling of Stress : Not at all  Social Connections: Moderately Isolated (08/31/2023)   Social Connection and Isolation Panel    Frequency of Communication with Friends and Family: More than three times a week    Frequency of Social Gatherings with Friends and Family: Three times a week    Attends Religious Services: More than 4 times per year    Active Member of Clubs or Organizations: No    Attends Banker Meetings: Never    Marital Status: Widowed  Intimate Partner Violence: Not At Risk (08/31/2023)   Humiliation, Afraid, Rape, and Kick questionnaire    Fear of Current or Ex-Partner: No    Emotionally Abused: No    Physically Abused: No    Sexually Abused: No    Review of Systems Per HPI.   Objective:   Vitals:   10/18/23 0906  BP: 118/60  Pulse: 84  Temp: 98 F (36.7 C)  TempSrc: Temporal  SpO2: 98%  Weight: 188 lb 6.4 oz (85.5 kg)  Height: 5' 8 (1.727 m)     Physical Exam Vitals reviewed.  Constitutional:      Appearance: He is well-developed.  HENT:     Head: Normocephalic and atraumatic.  Neck:     Vascular: No carotid bruit or JVD.  Cardiovascular:     Rate and Rhythm: Normal rate and regular rhythm.     Heart sounds: Normal  heart sounds. No murmur heard. Pulmonary:     Effort: Pulmonary effort is normal.     Breath sounds: Normal breath sounds. No rales.  Musculoskeletal:     Right lower leg: No edema.     Left lower leg: No edema.  Skin:    General: Skin is warm and dry.  Neurological:     General: No focal deficit present.     Mental Status: He is alert and oriented to person, place, and time.     GCS: GCS eye subscore is 4. GCS verbal subscore is 5. GCS motor subscore is 6.     Cranial Nerves: No cranial nerve deficit, dysarthria or facial asymmetry.     Sensory: Sensation is intact.     Motor: Motor function is intact. No weakness or pronator drift.     Coordination: Romberg sign negative. Coordination normal. Heel to [facility] Test normal. Rapid alternating movements normal.     Gait: Gait is intact. Gait normal.  Psychiatric:        Mood and Affect: Mood normal.  Assessment & Plan:  [name] [name] is a 78 y.o. male . Chronic HFrEF (heart failure with reduced ejection fraction) (HCC) - Plan: CBC  - Appears euvolemic, recent electrophysiology appointment no med changes.  Recent episode of blurred vision, malaise and dizziness as below, recommended he discuss these symptoms with cardiology to see if they recommend further testing or evaluation but asymptomatic at present.  ER precautions given if recurrent.  Lower urinary tract symptoms (LUTS) - Plan: tamsulosin  (FLOMAX ) 0.4 MG CAPS capsule  - Recent visit as above with normal urine culture.  Overall stable symptoms with tamsulosin , continue same.  Hypertriglyceridemia - Plan: Lipid panel  - Tolerating current med regimen, check updated labs.  Adjust plan accordingly  Prediabetes - Plan: Comprehensive metabolic panel with GFR, Hemoglobin A1c  - Commended on trying to watch sugars in the diet, check A1c and adjust plan accordingly.  Chronic neck pain Low back pain radiating to both legs Other polyneuropathy - Plan: gabapentin   (NEURONTIN ) 300 MG capsule  - Chronic neck, back pain, stable, gabapentin  dosing has been adjusted previously based on symptoms, stable with current dosing of twice per day dosing but refilled the medication as ordered previously if higher dose needed.  Potential side effects and risks of meds have been discussed.  Blurred vision - Plan: CBC, Comprehensive metabolic panel with GFR Malaise - Plan: CBC, Comprehensive metabolic panel with GFR Dizziness - Plan: CBC, Comprehensive metabolic panel with GFR  - Episode over the weekend.  Denied any associated cardiac symptoms, but with his cardiac history recommend he discuss this episode with his cardiologist to determine if other testing needed.  I will check CBC, electrolytes with labs today.  Nonfocal neuroexam today, regular rhythm on exam.  ER precautions given if recurs.   Meds ordered this encounter  Medications   gabapentin  (NEURONTIN ) 300 MG capsule    Sig: Take 1-2 capsules (300-600 mg total) by mouth 3 (three) times daily as needed (pain).    Dispense:  540 capsule    Refill:  1   tamsulosin  (FLOMAX ) 0.4 MG CAPS capsule    Sig: Take 1 capsule (0.4 mg total) by mouth at bedtime.    Dispense:  90 capsule    Refill:  3   Patient Instructions  I recommend discussing your episode last weekend with your cardiologist. I do recommend emergency room evaluation if those symptoms return. I am checking some labs today.   Thank you for coming in today. No change in medications at this time. If there are any concerns on your bloodwork, I will let you know. Take care!       Signed,   Reyes Pines, MD [facility], [facility] 10/18/23 9:48 AM

## 2023-10-20 ENCOUNTER — Ambulatory Visit: Payer: Self-pay | Admitting: Family Medicine

## 2023-10-20 ENCOUNTER — Encounter: Payer: Self-pay | Admitting: Internal Medicine

## 2023-10-20 NOTE — Progress Notes (Signed)
 Printed letter and mail results to patient.

## 2023-10-20 NOTE — Telephone Encounter (Signed)
 Called patient and went over lab results with him. Patient would like lab results mailed to him. Can you help me do this? Thank you!

## 2023-10-20 NOTE — Telephone Encounter (Signed)
Ok to mail copy of labs?

## 2023-10-26 ENCOUNTER — Ambulatory Visit: Payer: Medicare HMO

## 2023-10-26 DIAGNOSIS — I428 Other cardiomyopathies: Secondary | ICD-10-CM

## 2023-10-27 LAB — CUP PACEART REMOTE DEVICE CHECK
Battery Remaining Longevity: 61 mo
Battery Remaining Percentage: 69 %
Battery Voltage: 2.98 V
Brady Statistic AP VP Percent: 1 %
Brady Statistic AP VS Percent: 1 %
Brady Statistic AS VP Percent: 92 %
Brady Statistic AS VS Percent: 7.7 %
Brady Statistic RA Percent Paced: 1 %
Date Time Interrogation Session: 20250731034344
HighPow Impedance: 70 Ohm
HighPow Impedance: 70 Ohm
Implantable Lead Connection Status: 753985
Implantable Lead Connection Status: 753985
Implantable Lead Connection Status: 753985
Implantable Lead Implant Date: 20230802
Implantable Lead Implant Date: 20230802
Implantable Lead Implant Date: 20230802
Implantable Lead Location: 753859
Implantable Lead Location: 753860
Implantable Lead Location: 753860
Implantable Lead Model: 7122
Implantable Pulse Generator Implant Date: 20230802
Lead Channel Impedance Value: 250 Ohm
Lead Channel Impedance Value: 380 Ohm
Lead Channel Impedance Value: 750 Ohm
Lead Channel Pacing Threshold Amplitude: 0.625 V
Lead Channel Pacing Threshold Amplitude: 0.75 V
Lead Channel Pacing Threshold Amplitude: 2.25 V
Lead Channel Pacing Threshold Pulse Width: 0.05 ms
Lead Channel Pacing Threshold Pulse Width: 0.5 ms
Lead Channel Pacing Threshold Pulse Width: 0.5 ms
Lead Channel Sensing Intrinsic Amplitude: 11.3 mV
Lead Channel Sensing Intrinsic Amplitude: 4.1 mV
Lead Channel Setting Pacing Amplitude: 0.25 V
Lead Channel Setting Pacing Amplitude: 2 V
Lead Channel Setting Pacing Amplitude: 2 V
Lead Channel Setting Pacing Pulse Width: 0.05 ms
Lead Channel Setting Pacing Pulse Width: 0.5 ms
Lead Channel Setting Sensing Sensitivity: 0.5 mV
Pulse Gen Serial Number: 5544603

## 2023-10-28 ENCOUNTER — Ambulatory Visit: Payer: Self-pay | Admitting: Internal Medicine

## 2023-10-31 ENCOUNTER — Encounter: Payer: Self-pay | Admitting: Infectious Diseases

## 2023-10-31 ENCOUNTER — Ambulatory Visit: Admitting: Infectious Diseases

## 2023-10-31 ENCOUNTER — Other Ambulatory Visit: Payer: Self-pay

## 2023-10-31 VITALS — BP 119/65 | HR 103 | Temp 97.6°F | Ht 68.0 in | Wt 188.0 lb

## 2023-10-31 DIAGNOSIS — T8149XA Infection following a procedure, other surgical site, initial encounter: Secondary | ICD-10-CM | POA: Diagnosis not present

## 2023-10-31 DIAGNOSIS — M549 Dorsalgia, unspecified: Secondary | ICD-10-CM | POA: Diagnosis not present

## 2023-10-31 DIAGNOSIS — Z79899 Other long term (current) drug therapy: Secondary | ICD-10-CM

## 2023-10-31 NOTE — Progress Notes (Addendum)
 Patient Active Problem List   Diagnosis Date Noted   Postoperative infection 10/13/2023   Medication management 10/13/2023   Lower urinary tract symptoms (LUTS) 10/06/2023   Surgical site infection 09/19/2023   Unintentional weight loss 09/19/2023   Myelosuppression 09/19/2023   Postoperative wound breakdown 08/29/2023   S/P lumbar fusion 08/07/2023   Foreign body in right ear 06/09/2023   Impacted cerumen of left ear 06/09/2023   Fatigue 08/17/2022   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   Hx of colonic polyps 01/10/2022   Atrial fibrillation, transient (HCC) 11/02/2021   Hypotension    Chest pain 10/28/2021   Prostate cancer (HCC) 04/02/2021   Chronic HFrEF (heart failure with reduced ejection fraction) (HCC) 05/20/2020   Claudication (HCC) 09/01/2019   S/P cervical spinal fusion 04/20/2018   Paresthesia 01/10/2018   Neck pain 01/10/2018   LBBB (left bundle branch block) 09/07/2017   Other meniscus derangements, posterior horn of medial meniscus, left knee, insufficency fracture medial tibial plateau 04/07/2017   Closed nondisplaced fracture of lateral condyle of left tibia with routine healing 04/07/2017   Status post arthroscopy of left knee 04/07/2017   Insomnia 09/14/2015   Hyperlipidemia with target LDL less than 100 09/11/2014   Rotator cuff tendinitis 04/28/2014   Obesity (BMI 30-39.9) 12/29/2013   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   Exertional shortness of breath 06/26/2012   Polyp of colon, adenomatous 04/27/2012   Elevated PSA 10/20/2011   Hypertriglyceridemia 07/08/2011   Nonischemic cardiomyopathy (HCC) 09/04/2009   Current Outpatient Medications on File Prior to Visit  Medication Sig Dispense Refill   acetaminophen  (TYLENOL ) 500 MG tablet Take 1,000 mg by mouth every 6 (six) hours as needed for headache.     aspirin  EC 81 MG tablet Take 1 tablet (81 mg total) by mouth daily.     colchicine  0.6 MG tablet TAKE 1 TABLET  TWICE DAILY 90 tablet 0   Cyanocobalamin (VITAMIN B-12 PO) Take 1,000 mcg by mouth in the morning.     finasteride  (PROSCAR ) 5 MG tablet Take 5 mg by mouth at bedtime.     gabapentin  (NEURONTIN ) 300 MG capsule Take 1-2 capsules (300-600 mg total) by mouth 3 (three) times daily as needed (pain). 540 capsule 1   hydrALAZINE  (APRESOLINE ) 25 MG tablet Take 1 tablet (25 mg total) by mouth in the morning and at bedtime. 180 tablet 3   Magnesium  Hydroxide (DULCOLAX PO) Take 2 tablets by mouth 2 (two) times daily as needed.     MAGNESIUM  PO Take 400 mg by mouth in the morning.     Multiple Vitamin (MULTIVITAMIN WITH MINERALS) TABS tablet Take 1 tablet by mouth in the morning. One-A-Day Men Health Multivitamin     Omega-3 Fatty Acids (FISH OIL PO) Take 1,000 mg by mouth in the morning.     rosuvastatin  (CRESTOR ) 5 MG tablet Take 1 tablet (5 mg total) by mouth every Monday, Wednesday, and Friday. (Patient taking differently: Take 5 mg by mouth every Monday, Wednesday, and Friday. At night) 36 tablet 3   tamsulosin  (FLOMAX ) 0.4 MG CAPS capsule Take 1 capsule (0.4 mg total) by mouth at bedtime. 90 capsule 3   Turmeric (QC TUMERIC COMPLEX PO) Take 1 tablet by mouth in the morning.     zinc  sulfate, 50mg  elemental zinc , 220 (50 Zn) MG capsule Take 220 mg by mouth in the morning.     No current facility-administered medications on file prior to visit.   Subjective: Discussed the use of  AI scribe software for clinical note transcription with the patient, who gave verbal consent to proceed.   78 Y O Male with h/o CHF, BPH, Gout, HLD, Non ischemic cardiomyopathy s/p ICD, arthritis, A fib, Prostate ca who had decompressive lumbar laminectomy, PLIF L4-L5 and instrumented posterior fixation complicated by post operative wound infection requiring wound washout on 6/23 ( OR cx growing MR staph lugdunensis) who is here for fu. Initially completed 2 weeks of PO linezolid  which was then transitioned to PO doxycycline  for 4  weeks during last clinic visit with Dr Luiz for concerns of deep wound infection.   7/14 Reports completing course of PO linezolid  and currently on PO doxycycline   without missing doses or concerns and still has few pills left. Posterior wound has healed. Denies drainage. Denies fevers, chills. Denies nausea, vomiting or diarrhea. Has fu with Dr Joshua in July 29th. He is eager to finish off his antibiotics. Recently seen by Cardiology on 7/3 for ICD/CHF and PCP on 7/11 for LUTs. No other complaints.   8/5 Completed course of PO doxycycline  on 7/19. Seen by PCP on 7/23. Back pain was almost gone until he saw Neurosurgery 7/29 and later had a lot of walking during shopping that day, was in his feet approx 6-7 hrs and he was having difficulty putting things back in his car trunk. He was having difficulty lying down due to back pain, had to take 2 tylenol  pills following day, in the afternoon as well as evening. Back pain goes around his sides. Called Dr Sheryll office but they wanted ID to see him first. He also had some sore throat around the same time back pain started and ? Related. Sore throat improved with gargle. Back pain is better from 8/10 last week to 6/10 this week. He has not been taking any analgesics since last Wednesday. Back does not hurt when still, hurts when walking or standing. He reports back surgery many years ago after which back pain gets exacerbated when standing for long duration.   Denies fever, chills, nausea, vomiting, malaise. Denies weakness, numbness, or tingling. He is able to walk without support.   Review of Systems: all systems reviewed with pertinent positives and negatives as listed above   Past Medical History:  Diagnosis Date   Arthritis    BPH (benign prostatic hypertrophy)    CHF (congestive heart failure) (HCC)    Elevated PSA    Eye problem 03/06/2016   Dr. Etta retinal detachment left eye, no sx done   History of gout    pt 05-20-2013 states stable     History of melanoma excision    2011-- LEFT UPPER BACK   Hyperlipemia    Hypertriglyceridemia    ICD (implantable cardioverter-defibrillator) battery depletion 10/27/2021   Left bundle branch block 08/2017   Concern for LBBB mediated cardiomyopathy   Melanoma (HCC) 2011   back   Nonischemic cardiomyopathy (HCC) 09/2020   08/2009: EF 40-45%, Global HK-> 09/2017 -> EF 45-50% ; 3/'22: EF<20%.  Elevated LVEDP.;  09/2020: EF 20-25%.  GR 1 DD   Numbness    hands   Personal history of arthritis    Personal history of other diseases of circulatory system    Pre-diabetes    Sickle cell anemia (HCC)    pt denies   Past Surgical History:  Procedure Laterality Date   ANTERIOR CERVICAL DECOMP/DISCECTOMY FUSION N/A 04/20/2018   Procedure: Anterior Cervical Decompression Fusion - Cervical Three-Cervical Four - Cervical Four-Cervical Five - Cervical  Five-Cervical Six;  Surgeon: Joshua Alm RAMAN, MD;  Location: [facility];  Service: Neurosurgery;  Laterality: N/A;  Anterior Cervical Decompression Fusion - Cervical Three-Cervical Four - Cervical Four-Cervical Five - Cervical Five-Cervical Six   APPENDECTOMY  AS CHILD   BACK SURGERY     BIV ICD INSERTION CRT-D N/A 10/27/2021   Procedure: BIV ICD INSERTION CRT-D;  Surgeon: Waddell Danelle ORN, MD;  Location: [facility] INVASIVE CV LAB;  Service: Cardiovascular;  Laterality: N/A;   CARDIOVASCULAR STRESS TEST  10/05/1998   MILD GLOBAL HYPOKINESIS AND ISCHEMIAIN ANTEROSEPTAL  AT APEX/ EF 42%   CARPAL TUNNEL RELEASE Bilateral    CENTRAL LINE INSERTION  10/28/2021   Procedure: CENTRAL LINE INSERTION;  Surgeon: Inocencio Soyla Lunger, MD;  Location: [facility] INVASIVE CV LAB;  Service: Cardiovascular;;   CENTRAL LINE INSERTION  10/28/2021   Procedure: CENTRAL LINE INSERTION;  Surgeon: Wonda Sharper, MD;  Location: [facility] INVASIVE CV LAB;  Service: Cardiovascular;;   COLONOSCOPY WITH PROPOFOL  N/A 03/14/2022   Procedure: COLONOSCOPY WITH PROPOFOL ;  Surgeon: Shila Gustav GAILS, MD;   Location: [facility] ENDOSCOPY;  Service: Gastroenterology;  Laterality: N/A;   EXCISION MELANOMA LEFT UPPER BACK  10/14/2009   eye lid surgery Bilateral 2018   GOLD SEED IMPLANT N/A 07/23/2021   Procedure: GOLD SEED IMPLANT;  Surgeon: Cam Morene ORN, MD;  Location: [facility] ORS;  Service: Urology;  Laterality: N/A;   KNEE ARTHROSCOPY WITH SUBCHONDROPLASTY Left 04/07/2017   Procedure: LEFT KNEE ARTHROSCOPY WITH PARTIAL MEDIAL MENISCECTOMY AND MEDIAL TIBIAL SUBCHONDROPLASTY;  Surgeon: Vernetta Lonni GRADE, MD;  Location: [facility] ORS;  Service: Orthopedics;  Laterality: Left;   knee injections Bilateral    LEAD REVISION/REPAIR N/A 10/28/2021   Procedure: LEAD REVISION/REPAIR;  Surgeon: Inocencio Soyla Lunger, MD;  Location: [facility] INVASIVE CV LAB;  Service: Cardiovascular;  Laterality: N/A;   LEFT HEART CATH AND CORONARY ANGIOGRAPHY  04/2002   minimal irregularities in the LAD/  EF 50%  -   DR AL LITTLE   LUMBAR DISC SURGERY  09/12/2000   left  L5 -- S1   LUMBAR WOUND DEBRIDEMENT N/A 08/29/2023   Procedure: LUMBAR WOUND IRRIGATION AND DEBRIDEMENT;  Surgeon: Lanis Pupa, MD;  Location: [facility] OR;  Service: Neurosurgery;  Laterality: N/A;  I & D Lumbar wound   PERICARDIOCENTESIS N/A 10/28/2021   Procedure: PERICARDIOCENTESIS;  Surgeon: Inocencio Soyla Lunger, MD;  Location: [facility] INVASIVE CV LAB;  Service: Cardiovascular;  Laterality: N/A;   PERICARDIOCENTESIS N/A 10/28/2021   Procedure: PERICARDIOCENTESIS;  Surgeon: Wonda Sharper, MD;  Location: [facility] INVASIVE CV LAB;  Service: Cardiovascular;  Laterality: N/A;   PROSTATE BIOPSY N/A 05/22/2013   Procedure: BIOPSY TRANSRECTAL ULTRASONIC PROSTATE (TUBP);  Surgeon: Alm RAMAN Fragmin, MD;  Location: [facility];  Service: Urology;  Laterality: N/A;   RIGHT/LEFT HEART CATH AND CORONARY ANGIOGRAPHY N/A 06/25/2020   Procedure: RIGHT/LEFT HEART CATH AND CORONARY ANGIOGRAPHY;  Surgeon: Claudene Victory ORN, MD;  Location: [facility] INVASIVE CV LAB;:; Normal coronary arteries;  LVEDP 23 mmHg; PCWP 19 mmHg.   ROTATOR CUFF REPAIR Left 2016   SPACE OAR INSTILLATION N/A 07/23/2021   Procedure: SPACE OAR INSTILLATION;  Surgeon: Cam Morene ORN, MD;  Location: [facility] ORS;  Service: Urology;  Laterality: N/A;   THROAT SURGERY  04/20/2018   TRANSRECTAL ULTRASOUND PROSTATE BX  05-23-2005  &  04-19-2001   TRANSTHORACIC ECHOCARDIOGRAM  09/2017   EF 45-50 % (previously reported as 40 to 45%).  Incoordinate septal motion with mild LVH.  GR 1 DD.  Aortic sclerosis but no  stenosis.   TRANSTHORACIC ECHOCARDIOGRAM  10/06/2020   Severely reduced EF of 20 to 25%.  No LV thrombus.  Severe septal-lateral wall systolic dyssynchrony due to LBBB.  Global HK.  Moderately dilated LV.  GR 1 DD with mild LA dilation..  Unable to assess PAP with normal RV size and function.  Normal RAP.  Mild AOV sclerosis but no stenosis.  Mild to moderate MR.   TRANSTHORACIC ECHOCARDIOGRAM  06/16/2020   Severely reduced EF <20%.  Moderate severely dilated LV.  GR 2 DD.  Elevated LVEDP.  Mild LA dilation.  Mildly reduced RV function.  Mild MR.  Mild aortic valve calcification    Social History   Tobacco Use   Smoking status: Former    Current packs/day: 0.00    Average packs/day: 2.0 packs/day for 20.0 years (40.0 ttl pk-yrs)    Types: Cigarettes    Start date: 03/28/1960    Quit date: 03/28/1980    Years since quitting: 43.6   Smokeless tobacco: Never  Vaping Use   Vaping status: Never Used  Substance Use Topics   Alcohol  use: No    Alcohol /week: 0.0 standard drinks of alcohol    Drug use: No    Family History  Problem Relation Age of Onset   Arthritis Mother    COPD Father    Lung cancer Father    Cancer Sister        type unknown   Breast cancer Sister    Lung cancer Sister    Cardiomyopathy Other        idiopathic. trivial disease 2004 cath, 2010 cardiac CT - no sig. dz, nl LV fxn. cards: Little   Colon cancer Neg Hx     Allergies  Allergen Reactions   Nitrostat [Nitroglycerin]  Anaphylaxis    Cardiologist suggested he shouldn't take this med because it could kill him with his heart condition. Lowers BP   Vancomycin  Hives    08/29/23 per confirmation with RN, shortly after starting vancomycin  infusion patient developed itching and a rash on his torso   Ace Inhibitors Swelling   Mevacor  [Lovastatin ] Nausea Only   Solarcaine [Benzocaine] Dermatitis    Health Maintenance  Topic Date Due   Hepatitis B Vaccines (2 of 3 - 19+ 3-dose series) 02/11/2011   Zoster Vaccines- Shingrix (2 of 2) 07/28/2021   COVID-19 Vaccine (6 - 2024-25 season) 11/27/2022   INFLUENZA VACCINE  10/27/2023   Medicare Annual Wellness (AWV)  12/01/2023   Colonoscopy  03/15/2027   DTaP/Tdap/Td (3 - Td or Tdap) 06/05/2031   Pneumococcal Vaccine: 50+ Years  Completed   Hepatitis C Screening  Completed   HPV VACCINES  Aged Out   Meningococcal B Vaccine  Aged Out    Objective: BP 119/65   Pulse (!) 103   Temp 97.6 F (36.4 C) (Temporal)   Ht 5' 8 (1.727 m)   Wt 188 lb (85.3 kg)   SpO2 97%   BMI 28.59 kg/m    Physical Exam Constitutional:      Appearance: Normal appearance.  HENT:     Head: Normocephalic and atraumatic.      Mouth: Mucous membranes are moist.  Eyes:    Conjunctiva/sclera: Conjunctivae normal.     Pupils: Pupils are equal, round, and b/l symmetrical    Cardiovascular:     Rate and Rhythm: Normal rate and regular rhythm.     Heart sounds:  Pulmonary:     Effort: Pulmonary effort is normal.     Breath  sounds:  Abdominal:     General: Non distended     Palpations:  Musculoskeletal:        General: Normal range of motion.   Skin:    General: Skin is warm and dry.    Comments: posterior lumbar wound has healed, no tenderness,  no signs of infection/drainage or cellulitis    Neurological:     General: grossly non focal     Mental Status: awake, alert and oriented to person, place, and time.   Psychiatric:        Mood and Affect: Mood normal.   Lab  Results Lab Results  Component Value Date   WBC 6.2 10/18/2023   HGB 10.2 (L) 10/18/2023   HCT 32.2 (L) 10/18/2023   MCV 85.8 10/18/2023   PLT 154.0 10/18/2023    Lab Results  Component Value Date   CREATININE 1.16 10/18/2023   BUN 29 (H) 10/18/2023   NA 138 10/18/2023   K 4.5 10/18/2023   CL 105 10/18/2023   CO2 26 10/18/2023    Lab Results  Component Value Date   ALT 14 10/18/2023   AST 15 10/18/2023   ALKPHOS 78 10/18/2023   BILITOT 0.3 10/18/2023    Lab Results  Component Value Date   CHOL 121 10/18/2023   HDL 46.80 10/18/2023   LDLCALC 58 10/18/2023   TRIG 83.0 10/18/2023   CHOLHDL 3 10/18/2023   No results found for: LABRPR, RPRTITER No results found for: HIV1RNAQUANT, HIV1RNAVL, CD4TABS   Microbiology Results for orders placed or performed in visit on 10/06/23  Urine Culture     Status: None   Collection Time: 10/06/23  2:39 PM   Specimen: Urine  Result Value Ref Range Status   MICRO NUMBER: [id number]  Final   SPECIMEN QUALITY: Adequate  Final   Sample Source URINE  Final   STATUS: FINAL  Final   Result: No Growth  Final   Imaging CUP PACEART REMOTE DEVICE CHECK Result Date: 10/27/2023 ICD Scheduled remote reviewed. Normal device function.  Presenting rhythm:  AS/BiV pace Brief NSVT, irregular R-R, likely AF with rapid response, can not rule out short runs of NSVT AMS, EGM's c/w AF, poor rate control, burden <1% since 09/28/23, no OAC per EPIC Next remote 91 days. LA, CVRS   Assessment/Plan # Back pain - likely 2/2 over exerting last week, some improvement, no symptoms and signs to suggest recurrence of infection  # Post op lumbar wound infection  - 6/3 s/p washout, OR cx staph lugdunensis. Per OR note  some thin yellow fluid encountered/no obvious fascial dehiscence. - Initially thought to be superficial and planned for 2 weeks course of PO linezolid  but extended course of antibiotics on 6/18 to 4 more weeks of PO doxycycline   - Reports  doxycycline  completed on 7/19.   Plan  - CBC, ESR and CRP today  - fu with Neurosurgery - fu pending labs  - discusses signs and symptoms like severe back pain, fevers, chills, new neurologic symptoms, etc to go to ED   I spent 25 minutes involved in face-to-face and non-face-to-face activities for this patient on the day of the visit. Professional time spent includes the following activities: Preparing to see the patient (review of tests), Obtaining and reviewing separately obtained history (PCP notes 7/23), Performing a medically appropriate examination and evaluation , Ordering labs, Documenting clinical information in the EMR, Independently interpreting results (not separately reported), Counseling and educating the patient and Care coordination (not separately reported).  Of note, portions of this note may have been created with voice recognition software. While this note has been edited for accuracy, occasional wrong-word or 'sound-a-like' substitutions may have occurred due to the inherent limitations of voice recognition software.   Annalee Joseph, MD [facility] 10/31/2023, 1:40 PM

## 2023-11-01 ENCOUNTER — Encounter: Payer: Self-pay | Admitting: Cardiology

## 2023-11-01 ENCOUNTER — Ambulatory Visit: Payer: Self-pay | Admitting: Infectious Diseases

## 2023-11-01 ENCOUNTER — Ambulatory Visit: Admitting: Internal Medicine

## 2023-11-01 LAB — CBC
HCT: 34.9 % — ABNORMAL LOW (ref 38.5–50.0)
Hemoglobin: 10.4 g/dL — ABNORMAL LOW (ref 13.2–17.1)
MCH: 26.2 pg — ABNORMAL LOW (ref 27.0–33.0)
MCHC: 29.8 g/dL — ABNORMAL LOW (ref 32.0–36.0)
MCV: 87.9 fL (ref 80.0–100.0)
MPV: 11.8 fL (ref 7.5–12.5)
Platelets: 236 Thousand/uL (ref 140–400)
RBC: 3.97 Million/uL — ABNORMAL LOW (ref 4.20–5.80)
RDW: 14.5 % (ref 11.0–15.0)
WBC: 6 Thousand/uL (ref 3.8–10.8)

## 2023-11-01 LAB — C-REACTIVE PROTEIN: CRP: 12.2 mg/L — ABNORMAL HIGH (ref <8.0)

## 2023-11-01 LAB — SEDIMENTATION RATE: Sed Rate: 36 mm/h — ABNORMAL HIGH (ref 0–20)

## 2023-11-01 NOTE — Telephone Encounter (Signed)
 Patient returned call, he has trouble accessing MyChart. Read him Dr. Merrily message. Scheduled for follow up 8/28. Patient verbalized understanding and has no further questions.   Jennaya Pogue, BSN, RN

## 2023-11-01 NOTE — Telephone Encounter (Signed)
 Per Dr. Dea called patient to schedule follow up in 2 weeks or so. Left voicemail requesting call back.  Lorenda CHRISTELLA Code, RMA

## 2023-11-02 NOTE — Telephone Encounter (Signed)
 If the blood pressures below are with him taking 50 mg twice daily hydralazine  whether it is 2 of the 25 mg tablets or 150 mg tablet, then I think we probably keep him on the 50 twice daily.  I never knew that this was changed since my note so I do not know why this was initiated.  On my note there has been he was taken 50 mg twice daily but then 1 month later and has 25 mg twice daily with a note saying that he is taking differently-2 tabs twice daily.

## 2023-11-03 ENCOUNTER — Other Ambulatory Visit: Payer: Self-pay

## 2023-11-03 MED ORDER — HYDRALAZINE HCL 50 MG PO TABS: 50.0000 mg | ORAL_TABLET | Freq: Two times a day (BID) | ORAL | 3 refills | Status: AC

## 2023-11-06 ENCOUNTER — Other Ambulatory Visit (HOSPITAL_COMMUNITY): Payer: Self-pay | Admitting: Student

## 2023-11-06 DIAGNOSIS — M4316 Spondylolisthesis, lumbar region: Secondary | ICD-10-CM

## 2023-11-08 ENCOUNTER — Ambulatory Visit
Admission: RE | Admit: 2023-11-08 | Discharge: 2023-11-08 | Disposition: A | Discharge status: Home / Self Care | Source: Ambulatory Visit | Attending: Student | Admitting: Student

## 2023-11-08 DIAGNOSIS — M5127 Other intervertebral disc displacement, lumbosacral region: Secondary | ICD-10-CM | POA: Diagnosis not present

## 2023-11-08 DIAGNOSIS — M5125 Other intervertebral disc displacement, thoracolumbar region: Secondary | ICD-10-CM | POA: Diagnosis not present

## 2023-11-08 DIAGNOSIS — M5136 Other intervertebral disc degeneration, lumbar region with discogenic back pain only: Secondary | ICD-10-CM | POA: Diagnosis not present

## 2023-11-08 DIAGNOSIS — M4316 Spondylolisthesis, lumbar region: Secondary | ICD-10-CM | POA: Insufficient documentation

## 2023-11-14 DIAGNOSIS — L905 Scar conditions and fibrosis of skin: Secondary | ICD-10-CM | POA: Diagnosis not present

## 2023-11-14 DIAGNOSIS — D485 Neoplasm of uncertain behavior of skin: Secondary | ICD-10-CM | POA: Diagnosis not present

## 2023-11-14 DIAGNOSIS — D1801 Hemangioma of skin and subcutaneous tissue: Secondary | ICD-10-CM | POA: Diagnosis not present

## 2023-11-23 ENCOUNTER — Encounter: Payer: Self-pay | Admitting: Infectious Diseases

## 2023-11-23 ENCOUNTER — Other Ambulatory Visit: Payer: Self-pay

## 2023-11-23 ENCOUNTER — Ambulatory Visit: Admitting: Infectious Diseases

## 2023-11-23 VITALS — BP 112/58 | HR 100 | Temp 98.2°F | Ht 68.0 in | Wt 190.0 lb

## 2023-11-23 DIAGNOSIS — B957 Other staphylococcus as the cause of diseases classified elsewhere: Secondary | ICD-10-CM

## 2023-11-23 DIAGNOSIS — Z79899 Other long term (current) drug therapy: Secondary | ICD-10-CM | POA: Diagnosis not present

## 2023-11-23 DIAGNOSIS — Z981 Arthrodesis status: Secondary | ICD-10-CM | POA: Diagnosis not present

## 2023-11-23 DIAGNOSIS — T8149XA Infection following a procedure, other surgical site, initial encounter: Secondary | ICD-10-CM | POA: Diagnosis not present

## 2023-11-23 DIAGNOSIS — M549 Dorsalgia, unspecified: Secondary | ICD-10-CM

## 2023-11-23 NOTE — Progress Notes (Unsigned)
 Patient Active Problem List   Diagnosis Date Noted   Back pain 10/31/2023   Postoperative infection 10/13/2023   Medication management 10/13/2023   Lower urinary tract symptoms (LUTS) 10/06/2023   Surgical site infection 09/19/2023   Unintentional weight loss 09/19/2023   Myelosuppression 09/19/2023   Postoperative wound breakdown 08/29/2023   S/P lumbar fusion 08/07/2023   Foreign body in right ear 06/09/2023   Impacted cerumen of left ear 06/09/2023   Fatigue 08/17/2022   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   Hx of colonic polyps 01/10/2022   Atrial fibrillation, transient (HCC) 11/02/2021   Hypotension    Chest pain 10/28/2021   Prostate cancer (HCC) 04/02/2021   Chronic HFrEF (heart failure with reduced ejection fraction) (HCC) 05/20/2020   Claudication (HCC) 09/01/2019   S/P cervical spinal fusion 04/20/2018   Paresthesia 01/10/2018   Neck pain 01/10/2018   LBBB (left bundle branch block) 09/07/2017   Other meniscus derangements, posterior horn of medial meniscus, left knee, insufficency fracture medial tibial plateau 04/07/2017   Closed nondisplaced fracture of lateral condyle of left tibia with routine healing 04/07/2017   Status post arthroscopy of left knee 04/07/2017   Insomnia 09/14/2015   Hyperlipidemia with target LDL less than 100 09/11/2014   Rotator cuff tendinitis 04/28/2014   Obesity (BMI 30-39.9) 12/29/2013   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   Exertional shortness of breath 06/26/2012   Polyp of colon, adenomatous 04/27/2012   Elevated PSA 10/20/2011   Hypertriglyceridemia 07/08/2011   Nonischemic cardiomyopathy (HCC) 09/04/2009   Current Outpatient Medications on File Prior to Visit  Medication Sig Dispense Refill   acetaminophen  (TYLENOL ) 500 MG tablet Take 1,000 mg by mouth every 6 (six) hours as needed for headache.     aspirin  EC 81 MG tablet Take 1 tablet (81 mg total) by mouth daily.     colchicine  0.6  MG tablet TAKE 1 TABLET TWICE DAILY 90 tablet 0   Cyanocobalamin (VITAMIN B-12 PO) Take 1,000 mcg by mouth in the morning.     finasteride  (PROSCAR ) 5 MG tablet Take 5 mg by mouth at bedtime.     gabapentin  (NEURONTIN ) 300 MG capsule Take 1-2 capsules (300-600 mg total) by mouth 3 (three) times daily as needed (pain). 540 capsule 1   hydrALAZINE  (APRESOLINE ) 50 MG tablet Take 1 tablet (50 mg total) by mouth 2 (two) times daily. 270 tablet 3   Magnesium  Hydroxide (DULCOLAX PO) Take 2 tablets by mouth 2 (two) times daily as needed.     MAGNESIUM  PO Take 400 mg by mouth in the morning.     Multiple Vitamin (MULTIVITAMIN WITH MINERALS) TABS tablet Take 1 tablet by mouth in the morning. One-A-Day Men Health Multivitamin     Omega-3 Fatty Acids (FISH OIL PO) Take 1,000 mg by mouth in the morning.     rosuvastatin  (CRESTOR ) 5 MG tablet Take 1 tablet (5 mg total) by mouth every Monday, Wednesday, and Friday. (Patient taking differently: Take 5 mg by mouth every Monday, Wednesday, and Friday. At night) 36 tablet 3   tamsulosin  (FLOMAX ) 0.4 MG CAPS capsule Take 1 capsule (0.4 mg total) by mouth at bedtime. 90 capsule 3   Turmeric (QC TUMERIC COMPLEX PO) Take 1 tablet by mouth in the morning.     zinc  sulfate, 50mg  elemental zinc , 220 (50 Zn) MG capsule Take 220 mg by mouth in the morning.     No current facility-administered medications on file prior to visit.   Subjective: Discussed  the use of AI scribe software for clinical note transcription with the patient, who gave verbal consent to proceed.   78 Y O Male with h/o CHF, BPH, Gout, HLD, Non ischemic cardiomyopathy s/p ICD, arthritis, A fib, Prostate ca who had decompressive lumbar laminectomy, PLIF L4-L5 and instrumented posterior fixation complicated by post operative wound infection requiring wound washout on 6/23 ( OR cx growing MR staph lugdunensis) who is here for fu. Initially completed 2 weeks of PO linezolid  which was then transitioned to PO  doxycycline  for 4 weeks during last clinic visit with Dr Luiz for concerns of deep wound infection.   7/14 Reports completing course of PO linezolid  and currently on PO doxycycline   without missing doses or concerns and still has few pills left. Posterior wound has healed. Denies drainage. Denies fevers, chills. Denies nausea, vomiting or diarrhea. Has fu with Dr Joshua in July 29th. He is eager to finish off his antibiotics. Recently seen by Cardiology on 7/3 for ICD/CHF and PCP on 7/11 for LUTs. No other complaints.   8/5 Completed course of PO doxycycline  on 7/19. Seen by PCP on 7/23. Back pain was almost gone until he saw Neurosurgery 7/29 and later had a lot of walking during shopping that day, was in his feet approx 6-7 hrs and he was having difficulty putting things back in his car trunk. He was having difficulty lying down due to back pain, had to take 2 tylenol  pills following day, in the afternoon as well as evening. Back pain goes around his sides. Called Dr Sheryll office but they wanted ID to see him first. He also had some sore throat around the same time back pain started and ? Related. Sore throat improved with gargle. Back pain is better from 8/10 last week to 6/10 this week. He has not been taking any analgesics since last Wednesday. Back does not hurt when still, hurts when walking or standing. He reports back surgery many years ago after which back pain gets exacerbated when standing for long duration.   Denies fever, chills, nausea, vomiting, malaise. Denies weakness, numbness, or tingling. He is able to walk without support.   8/28 Back pain is non existent if he stays still or finds a comfortable position if lying down. Takes 6 tylenol  and 4 gabapentin  a day. He thinks he might have one time he had fevers but no recorded fevers, chills, nausea, vomiting. Per CT, neurosurgery, looks OK. Next fu sept 9th. No new numbness, weakness.   He experiences severe back pain rated 9 to 10 out  of 10, worsened by movement and improved by rest. A CT scan was performed due to the severity. Previous labs showed slightly elevated sed rate and CRP, but no antibiotics were prescribed as the values were not definitively indicative of infection. He has no consistent fevers or chills, though he felt extremely cold a couple of weeks ago. There is no weakness or numbness in his legs. His hemoglobin, which had dropped to 8 while on Linsolid in the hospital, has increased to 10. He lacks energy and becomes exhausted with minimal exertion.  Review of Systems: all systems reviewed with pertinent positives and negatives as listed above   Past Medical History:  Diagnosis Date   Arthritis    BPH (benign prostatic hypertrophy)    CHF (congestive heart failure) (HCC)    Elevated PSA    Eye problem 03/06/2016   Dr. Etta retinal detachment left eye, no sx done   History of  gout    pt 05-20-2013 states stable    History of melanoma excision    2011-- LEFT UPPER BACK   Hyperlipemia    Hypertriglyceridemia    ICD (implantable cardioverter-defibrillator) battery depletion 10/27/2021   Left bundle branch block 08/2017   Concern for LBBB mediated cardiomyopathy   Melanoma (HCC) 2011   back   Nonischemic cardiomyopathy (HCC) 09/2020   08/2009: EF 40-45%, Global HK-> 09/2017 -> EF 45-50% ; 3/'22: EF<20%.  Elevated LVEDP.;  09/2020: EF 20-25%.  GR 1 DD   Numbness    hands   Personal history of arthritis    Personal history of other diseases of circulatory system    Pre-diabetes    Sickle cell anemia (HCC)    pt denies   Past Surgical History:  Procedure Laterality Date   ANTERIOR CERVICAL DECOMP/DISCECTOMY FUSION N/A 04/20/2018   Procedure: Anterior Cervical Decompression Fusion - Cervical Three-Cervical Four - Cervical Four-Cervical Five - Cervical Five-Cervical Six;  Surgeon: Joshua Alm RAMAN, MD;  Location: [facility] OR;  Service: Neurosurgery;  Laterality: N/A;  Anterior Cervical Decompression Fusion -  Cervical Three-Cervical Four - Cervical Four-Cervical Five - Cervical Five-Cervical Six   APPENDECTOMY  AS CHILD   BACK SURGERY     BIV ICD INSERTION CRT-D N/A 10/27/2021   Procedure: BIV ICD INSERTION CRT-D;  Surgeon: Waddell Danelle ORN, MD;  Location: [facility] INVASIVE CV LAB;  Service: Cardiovascular;  Laterality: N/A;   CARDIOVASCULAR STRESS TEST  10/05/1998   MILD GLOBAL HYPOKINESIS AND ISCHEMIAIN ANTEROSEPTAL  AT [facility]PEX/ EF 42%   CARPAL TUNNEL RELEASE Bilateral    CENTRAL LINE INSERTION  10/28/2021   Procedure: CENTRAL LINE INSERTION;  Surgeon: Inocencio Soyla Lunger, MD;  Location: [facility] INVASIVE CV LAB;  Service: Cardiovascular;;   CENTRAL LINE INSERTION  10/28/2021   Procedure: CENTRAL LINE INSERTION;  Surgeon: Wonda Sharper, MD;  Location: [facility] INVASIVE CV LAB;  Service: Cardiovascular;;   COLONOSCOPY WITH PROPOFOL  N/A 03/14/2022   Procedure: COLONOSCOPY WITH PROPOFOL ;  Surgeon: Shila Gustav GAILS, MD;  Location: [facility] ENDOSCOPY;  Service: Gastroenterology;  Laterality: N/A;   EXCISION MELANOMA LEFT UPPER BACK  10/14/2009   eye lid surgery Bilateral 2018   GOLD SEED IMPLANT N/A 07/23/2021   Procedure: GOLD SEED IMPLANT;  Surgeon: Cam Morene ORN, MD;  Location: [facility] ORS;  Service: Urology;  Laterality: N/A;   KNEE ARTHROSCOPY WITH SUBCHONDROPLASTY Left 04/07/2017   Procedure: LEFT KNEE ARTHROSCOPY WITH PARTIAL MEDIAL MENISCECTOMY AND MEDIAL TIBIAL SUBCHONDROPLASTY;  Surgeon: Vernetta Lonni GRADE, MD;  Location: [facility] ORS;  Service: Orthopedics;  Laterality: Left;   knee injections Bilateral    LEAD REVISION/REPAIR N/A 10/28/2021   Procedure: LEAD REVISION/REPAIR;  Surgeon: Inocencio Soyla Lunger, MD;  Location: [facility] INVASIVE CV LAB;  Service: Cardiovascular;  Laterality: N/A;   LEFT HEART CATH AND CORONARY ANGIOGRAPHY  04/2002   minimal irregularities in the LAD/  EF 50%  -   DR AL LITTLE   LUMBAR DISC SURGERY  09/12/2000   left  L5 -- S1   LUMBAR WOUND DEBRIDEMENT N/A 08/29/2023   Procedure: LUMBAR  WOUND IRRIGATION AND DEBRIDEMENT;  Surgeon: Lanis Pupa, MD;  Location: [facility] OR;  Service: Neurosurgery;  Laterality: N/A;  I & D Lumbar wound   PERICARDIOCENTESIS N/A 10/28/2021   Procedure: PERICARDIOCENTESIS;  Surgeon: Inocencio Soyla Lunger, MD;  Location: [facility] INVASIVE CV LAB;  Service: Cardiovascular;  Laterality: N/A;   PERICARDIOCENTESIS N/A 10/28/2021   Procedure: PERICARDIOCENTESIS;  Surgeon: Wonda Sharper, MD;  Location: [facility] INVASIVE CV LAB;  Service: Cardiovascular;  Laterality: N/A;   PROSTATE BIOPSY N/A 05/22/2013   Procedure: BIOPSY TRANSRECTAL ULTRASONIC PROSTATE (TUBP);  Surgeon: Alm GORMAN Fragmin, MD;  Location: [facility];  Service: Urology;  Laterality: N/A;   RIGHT/LEFT HEART CATH AND CORONARY ANGIOGRAPHY N/A 06/25/2020   Procedure: RIGHT/LEFT HEART CATH AND CORONARY ANGIOGRAPHY;  Surgeon: Claudene Victory ORN, MD;  Location: [facility] INVASIVE CV LAB;:; Normal coronary arteries; LVEDP 23 mmHg; PCWP 19 mmHg.   ROTATOR CUFF REPAIR Left 2016   SPACE OAR INSTILLATION N/A 07/23/2021   Procedure: SPACE OAR INSTILLATION;  Surgeon: Cam Morene ORN, MD;  Location: [facility] ORS;  Service: Urology;  Laterality: N/A;   THROAT SURGERY  04/20/2018   TRANSRECTAL ULTRASOUND PROSTATE BX  05-23-2005  &  04-19-2001   TRANSTHORACIC ECHOCARDIOGRAM  09/2017   EF 45-50 % (previously reported as 40 to 45%).  Incoordinate septal motion with mild LVH.  GR 1 DD.  Aortic sclerosis but no stenosis.   TRANSTHORACIC ECHOCARDIOGRAM  10/06/2020   Severely reduced EF of 20 to 25%.  No LV thrombus.  Severe septal-lateral wall systolic dyssynchrony due to LBBB.  Global HK.  Moderately dilated LV.  GR 1 DD with mild LA dilation..  Unable to assess PAP with normal RV size and function.  Normal RAP.  Mild AOV sclerosis but no stenosis.  Mild to moderate MR.   TRANSTHORACIC ECHOCARDIOGRAM  06/16/2020   Severely reduced EF <20%.  Moderate severely dilated LV.  GR 2 DD.  Elevated LVEDP.  Mild LA dilation.  Mildly  reduced RV function.  Mild MR.  Mild aortic valve calcification    Social History   Tobacco Use   Smoking status: Former    Current packs/day: 0.00    Average packs/day: 2.0 packs/day for 20.0 years (40.0 ttl pk-yrs)    Types: Cigarettes    Start date: 03/28/1960    Quit date: 03/28/1980    Years since quitting: 43.6   Smokeless tobacco: Never  Vaping Use   Vaping status: Never Used  Substance Use Topics   Alcohol  use: No    Alcohol /week: 0.0 standard drinks of alcohol    Drug use: No    Family History  Problem Relation Age of Onset   Arthritis Mother    COPD Father    Lung cancer Father    Cancer Sister        type unknown   Breast cancer Sister    Lung cancer Sister    Cardiomyopathy Other        idiopathic. trivial disease 2004 cath, 2010 cardiac CT - no sig. dz, nl LV fxn. cards: Little   Colon cancer Neg Hx     Allergies  Allergen Reactions   Nitrostat [Nitroglycerin] Anaphylaxis    Cardiologist suggested he shouldn't take this med because it could kill him with his heart condition. Lowers BP   Vancomycin  Hives    08/29/23 per confirmation with RN, shortly after starting vancomycin  infusion patient developed itching and a rash on his torso   Ace Inhibitors Swelling   Mevacor  [Lovastatin ] Nausea Only   Solarcaine [Benzocaine] Dermatitis    Health Maintenance  Topic Date Due   Zoster Vaccines- Shingrix (2 of 2) 07/28/2021   COVID-19 Vaccine (6 - 2024-25 season) 11/27/2022   INFLUENZA VACCINE  10/27/2023   Medicare Annual Wellness (AWV)  12/01/2023   Colonoscopy  03/15/2027   DTaP/Tdap/Td (3 - Td or Tdap) 06/05/2031   Pneumococcal Vaccine: 50+ Years  Completed   Hepatitis C Screening  Completed   HPV VACCINES  Aged Out   Meningococcal B Vaccine  Aged Out   Hepatitis B Vaccines 19-59 Average Risk  Discontinued    Objective: BP (!) 112/58   Pulse 100   Temp 98.2 F (36.8 C) (Temporal)   Ht 5' 8 (1.727 m)   Wt 190 lb (86.2 kg)   SpO2 96%   BMI 28.89  kg/m    Physical Exam Constitutional:      Appearance: Normal appearance.  HENT:     Head: Normocephalic and atraumatic.      Mouth: Mucous membranes are moist.  Eyes:    Conjunctiva/sclera: Conjunctivae normal.     Pupils: Pupils are equal, round, and b/l symmetrical    Cardiovascular:     Rate and Rhythm: Normal rate and regular rhythm.     Heart sounds:  Pulmonary:     Effort: Pulmonary effort is normal.     Breath sounds:  Abdominal:     General: Non distended     Palpations:  Musculoskeletal:        General: Normal range of motion.   Skin:    General: Skin is warm and dry.    Comments: posterior lumbar wound has healed, no tenderness,  no signs of infection/drainage or cellulitis    Neurological:     General: grossly non focal     Mental Status: awake, alert and oriented to person, place, and time.   Psychiatric:        Mood and Affect: Mood normal.   Lab Results Lab Results  Component Value Date   WBC 6.0 10/31/2023   HGB 10.4 (L) 10/31/2023   HCT 34.9 (L) 10/31/2023   MCV 87.9 10/31/2023   PLT 236 10/31/2023    Lab Results  Component Value Date   CREATININE 1.16 10/18/2023   BUN 29 (H) 10/18/2023   NA 138 10/18/2023   K 4.5 10/18/2023   CL 105 10/18/2023   CO2 26 10/18/2023    Lab Results  Component Value Date   ALT 14 10/18/2023   AST 15 10/18/2023   ALKPHOS 78 10/18/2023   BILITOT 0.3 10/18/2023    Lab Results  Component Value Date   CHOL 121 10/18/2023   HDL 46.80 10/18/2023   LDLCALC 58 10/18/2023   TRIG 83.0 10/18/2023   CHOLHDL 3 10/18/2023   No results found for: LABRPR, RPRTITER No results found for: HIV1RNAQUANT, HIV1RNAVL, CD4TABS   Microbiology Results for orders placed or performed in visit on 10/06/23  Urine Culture     Status: None   Collection Time: 10/06/23  2:39 PM   Specimen: Urine  Result Value Ref Range Status   MICRO NUMBER: [id number]  Final   SPECIMEN QUALITY: Adequate  Final   Sample Source  URINE  Final   STATUS: FINAL  Final   Result: No Growth  Final   Imaging CT LUMBAR SPINE WO CONTRAST Result Date: 11/19/2023 EXAM: CT OF THE LUMBAR SPINE WITHOUT CONTRAST 11/08/2023 01:19:05 PM TECHNIQUE: CT of the lumbar spine was performed without the administration of intravenous contrast. Multiplanar reformatted images are provided for review. Automated exposure control, iterative reconstruction, and/or weight based adjustment of the mA/kV was utilized to reduce the radiation dose to as low as reasonably achievable. COMPARISON: CT Lumbar Myelogram 07/10/2023. CLINICAL HISTORY: Spondylolisthesis of the lumbar region. Persistent low back pain. History of lumbar fusion in 07/2023 and subsequent debridement. FINDINGS: BONES AND ALIGNMENT: 5 lumbar type vertebrae. Unchanged grade 1 anterolisthesis of L4 on  L5 and grade 1 retrolisthesis of L5 on S1. No acute fracture or suspicious lesion. Interval posterior and interbody fusion at L4-5 without evidence of solid arthrodesis. Lucency along the primarily proximal portions of the right greater than left L4 pedicle screws. Endplate sclerosis adjacent to the L4-5 interbody spacers. DEGENERATIVE CHANGES: T12-L1: Central disc extrusion with superior migration, grossly similar to the prior CT and without evidence of significant stenosis. L1-2: Left eccentric disc bulging results in mild left neural foraminal stenosis without evidence of significant spinal stenosis, similar to prior. L2-3: Mild facet hypertrophy without evidence of significant stenosis, similar to prior. L3-4: Disc bulging and moderate facet and ligamentum flavum hypertrophy result in minimal to mild spinal stenosis and mild bilateral neural foraminal stenosis, similar to prior. L4-5: Interval bilateral laminectomies, partial facetectomies, and fusion without suspected significant residual stenosis. L5-S1: Moderate disc space narrowing with vacuum disc phenomenon. Suspected prior left laminectomy. Disc  bulging, endplate spurring, and mild facet hypertrophy result in moderate right and left neural foraminal stenosis, similar to prior. There is a small chronic left paracentral disc protrusion without suspected significant spinal stenosis. SOFT TISSUES: Postoperative changes in the posterior lumbar soft tissues. Abdominal aortic atherosclerosis without aneurysm. Colonic diverticulosis. IMPRESSION: 1. Interval posterior and interbody fusion at L4-5 without evidence of solid arthrodesis. Lucency along the right greater than left L4 screws suggestive of loosening. Infection not excluded. 2. Unchanged  disc degeneration elsewhere as above. Electronically signed by: Dasie Hamburg MD 11/19/2023 11:51 AM EDT RP Workstation: [id number]   CUP PACEART REMOTE DEVICE CHECK Result Date: 10/27/2023 ICD Scheduled remote reviewed. Normal device function.  Presenting rhythm:  AS/BiV pace Brief NSVT, irregular R-R, likely AF with rapid response, can not rule out short runs of NSVT AMS, EGM's c/w AF, poor rate control, burden <1% since 09/28/23, no OAC per EPIC Next remote 91 days. LA, CVRS   Assessment/Plan # Back pain - likely 2/2 over exerting last week, some improvement, no symptoms and signs to suggest recurrence of infection  # Post op lumbar wound infection  - 6/3 s/p washout, OR cx staph lugdunensis. Per OR note  some thin yellow fluid encountered/no obvious fascial dehiscence. - Initially thought to be superficial and planned for 2 weeks course of PO linezolid  but extended course of antibiotics on 6/18 to 4 more weeks of PO doxycycline   - Reports doxycycline  completed on 7/19.   Plan  - CBC, ESR and CRP today  - fu with Neurosurgery - fu pending labs  - discusses signs and symptoms like severe back pain, fevers, chills, new neurologic symptoms, etc to go to ED   I spent 25 minutes involved in face-to-face and non-face-to-face activities for this patient on the day of the visit. Professional time spent includes  the following activities: Preparing to see the patient (review of tests), Obtaining and reviewing separately obtained history (PCP notes 7/23), Performing a medically appropriate examination and evaluation , Ordering labs, Documenting clinical information in the EMR, Independently interpreting results (not separately reported), Counseling and educating the patient and Care coordination (not separately reported).   Of note, portions of this note may have been created with voice recognition software. While this note has been edited for accuracy, occasional wrong-word or 'sound-a-like' substitutions may have occurred due to the inherent limitations of voice recognition software.   Annalee Joseph, MD [facility] 11/23/2023, 3:35 PM

## 2023-11-24 ENCOUNTER — Ambulatory Visit: Payer: Self-pay | Admitting: Infectious Diseases

## 2023-11-24 DIAGNOSIS — T8140XA Infection following a procedure, unspecified, initial encounter: Secondary | ICD-10-CM

## 2023-11-24 DIAGNOSIS — T8149XA Infection following a procedure, other surgical site, initial encounter: Secondary | ICD-10-CM

## 2023-11-24 LAB — CBC
HCT: 30.4 % — ABNORMAL LOW (ref 38.5–50.0)
Hemoglobin: 9.2 g/dL — ABNORMAL LOW (ref 13.2–17.1)
MCH: 25.6 pg — ABNORMAL LOW (ref 27.0–33.0)
MCHC: 30.3 g/dL — ABNORMAL LOW (ref 32.0–36.0)
MCV: 84.7 fL (ref 80.0–100.0)
MPV: 11.7 fL (ref 7.5–12.5)
Platelets: 201 Thousand/uL (ref 140–400)
RBC: 3.59 Million/uL — ABNORMAL LOW (ref 4.20–5.80)
RDW: 14.9 % (ref 11.0–15.0)
WBC: 6.6 Thousand/uL (ref 3.8–10.8)

## 2023-11-24 LAB — SEDIMENTATION RATE: Sed Rate: 51 mm/h — ABNORMAL HIGH (ref 0–20)

## 2023-11-24 LAB — C-REACTIVE PROTEIN: CRP: 46.9 mg/L — ABNORMAL HIGH (ref <8.0)

## 2023-11-24 MED ORDER — DOXYCYCLINE HYCLATE 100 MG PO TABS: 100.0000 mg | ORAL_TABLET | Freq: Two times a day (BID) | ORAL | 1 refills | Status: DC

## 2023-11-24 NOTE — Telephone Encounter (Signed)
 Spoke with [name] [name], discussed elevated inflammatory markers and need to start doxycycline . He would like this sent to [facility] on [address].   Reminded him of follow up in September. Patient verbalized understanding and has no further questions.   [name] [name], BSN, RN

## 2023-11-27 ENCOUNTER — Other Ambulatory Visit: Payer: Self-pay | Admitting: Cardiology

## 2023-12-05 DIAGNOSIS — S32009K Unspecified fracture of unspecified lumbar vertebra, subsequent encounter for fracture with nonunion: Secondary | ICD-10-CM | POA: Diagnosis not present

## 2023-12-05 DIAGNOSIS — Z6828 Body mass index (BMI) 28.0-28.9, adult: Secondary | ICD-10-CM | POA: Diagnosis not present

## 2023-12-05 DIAGNOSIS — M4316 Spondylolisthesis, lumbar region: Secondary | ICD-10-CM | POA: Diagnosis not present

## 2023-12-06 ENCOUNTER — Telehealth: Payer: Self-pay | Admitting: Internal Medicine

## 2023-12-06 NOTE — Telephone Encounter (Signed)
 Pt wants to talk to Dr Waddell about using a Bone Stimulation Belt. He wants to make sure he can use it with his pacemaker and defib.

## 2023-12-06 NOTE — Telephone Encounter (Signed)
 Returned call to Pt.  He is interested in using a bone stimulation belt.  He asks that this nurse speak with Velinda at [phone] to acquire further information on this technology.  Will obtain additional information in regards to this technology and provide to [employer] for final determination.

## 2023-12-08 NOTE — Telephone Encounter (Signed)
 [name] [name] calls back to inform us  that he can bring [name] [name] in on Monday 12/11/23 at 1000am.   Verified appt time and gave him the location information.  Industry made aware.

## 2023-12-08 NOTE — Telephone Encounter (Signed)
 Outreach made to Tim at [phone] to try and set up a meeting for next week with [employer] rep and patient. No answer. LMTCB.

## 2023-12-08 NOTE — Telephone Encounter (Signed)
 Outreach made to [employer].  They did not think that the bone stimulation belt would cause issues with Pt's device.  Discussed with Charlies Arthur.  Per Charlies, Pt should be brought in with industry representative to test product while interrogating to ensure no issue with device.  Discussed with Redell with [employer].  He is able to come next week for testing.  Discussed with Pt.  He is available next week Monday, Tuesday or Friday.  Will need to contact Tim to determine which day he can be here with product and then schedule.

## 2023-12-11 ENCOUNTER — Ambulatory Visit: Attending: Internal Medicine

## 2023-12-11 DIAGNOSIS — Z95810 Presence of automatic (implantable) cardiac defibrillator: Secondary | ICD-10-CM

## 2023-12-11 NOTE — Progress Notes (Signed)
 Patient present today with Bone Stimulator Belt rep to test device with his ICD.    Bryan ([employer] Rep) present as well.   No interference with belt noted with device sensing.  Okay to use belt device.   Note has been added to patient's device indicating that testing was performed and belt cleared for use.

## 2023-12-13 ENCOUNTER — Ambulatory Visit (INDEPENDENT_AMBULATORY_CARE_PROVIDER_SITE_OTHER): Payer: Medicare HMO

## 2023-12-13 VITALS — Ht 68.0 in | Wt 190.0 lb

## 2023-12-13 DIAGNOSIS — Z0000 Encounter for general adult medical examination without abnormal findings: Secondary | ICD-10-CM

## 2023-12-13 NOTE — Patient Instructions (Signed)
 [name],  Thank you for taking the time for your Medicare Wellness Visit. I appreciate your continued commitment to your health goals. Please review the care plan we discussed, and feel free to reach out if I can assist you further.  Medicare recommends these wellness visits once per year to help you and your care team stay ahead of potential health issues. These visits are designed to focus on prevention, allowing your provider to concentrate on managing your acute and chronic conditions during your regular appointments.  Please note that Annual Wellness Visits do not include a physical exam. Some assessments may be limited, especially if the visit was conducted virtually. If needed, we may recommend a separate in-person follow-up with your provider.  Ongoing Care Seeing your primary care provider every 3 to 6 months helps us  monitor your health and provide consistent, personalized care. Next office visit on 01/15/2024.  You are due for a 2nd Shingrix vaccine and a Flu vaccine, which both can be given at your pharmacy.    Referrals If a referral was made during today's visit and you haven't received any updates within two weeks, please contact the referred provider directly to check on the status.  Recommended Screenings:  Health Maintenance  Topic Date Due   Zoster (Shingles) Vaccine (2 of 2) 07/28/2021   Flu Shot  10/27/2023   COVID-19 Vaccine (6 - 2025-26 season) 11/27/2023   Medicare Annual Wellness Visit  12/12/2024   Colon Cancer Screening  03/15/2027   DTaP/Tdap/Td vaccine (3 - Td or Tdap) 06/05/2031   Pneumococcal Vaccine for age over 65  Completed   Hepatitis C Screening  Completed   HPV Vaccine  Aged Out   Meningitis B Vaccine  Aged Out   Hepatitis B Vaccine  Discontinued       12/13/2023   11:09 AM  Advanced Directives  Does Patient Have a Medical Advance Directive? Yes  Type of Estate agent of [name] [name];Living will  Copy of Healthcare Power of  Attorney in Chart? No - copy requested   Advance Care Planning is important because it: Ensures you receive medical care that aligns with your values, goals, and preferences. Provides guidance to your family and loved ones, reducing the emotional burden of decision-making during critical moments.  Vision: Annual vision screenings are recommended for early detection of glaucoma, cataracts, and diabetic retinopathy. These exams can also reveal signs of chronic conditions such as diabetes and high blood pressure.  Dental: Annual dental screenings help detect early signs of oral cancer, gum disease, and other conditions linked to overall health, including heart disease and diabetes.  Please see the attached documents for additional preventive care recommendations.

## 2023-12-13 NOTE — Progress Notes (Signed)
 Subjective:   [name] [name] is a 78 y.o. who presents for a Medicare Wellness preventive visit.  As a reminder, Annual Wellness Visits don't include a physical exam, and some assessments may be limited, especially if this visit is performed virtually. We may recommend an in-person follow-up visit with your provider if needed.  Visit Complete: Virtual I connected with  [name] [name] on 12/13/23 by a audio enabled telemedicine application and verified that I am speaking with the correct person using two identifiers.  Patient Location: Home  Provider Location: Home Office  I discussed the limitations of evaluation and management by telemedicine. The patient expressed understanding and agreed to proceed.  Vital Signs: Because this visit was a virtual/telehealth visit, some criteria may be missing or patient reported. Any vitals not documented were not able to be obtained and vitals that have been documented are patient reported.  VideoDeclined- This patient declined Librarian, academic. Therefore the visit was completed with audio only.  Persons Participating in Visit: Patient.  AWV Questionnaire: No: Patient Medicare AWV questionnaire was not completed prior to this visit.  Cardiac Risk Factors include: advanced age (>97men, >34 women);male gender;dyslipidemia;Other (see comment), Risk factor comments: HFrEF, A-Fib     Objective:    Today's Vitals   12/13/23 1047 12/13/23 1048  Weight: 190 lb (86.2 kg)   Height: 5' 8 (1.727 m)   PainSc:  5    Body mass index is 28.89 kg/m.     12/13/2023   11:09 AM 08/29/2023    1:45 PM 07/28/2023   11:25 AM 02/16/2023    3:02 PM 12/07/2021   11:16 AM 11/23/2021    3:18 PM 10/28/2021    4:45 AM  Advanced Directives  Does Patient Have a Medical Advance Directive? Yes Yes Yes Yes Yes Yes Yes  Type of Estate agent of [place];Living will Healthcare Power of [place];Living will  Healthcare Power of [place];Living will Living will;Healthcare Power of Attorney Living will;Healthcare Power of [employer] Power of [place];Living will   Does patient want to make changes to medical advance directive?  No - Guardian declined No - Patient declined      Copy of Healthcare Power of Attorney in Chart? No - copy requested No - copy requested No - copy requested   No - copy requested     Current Medications (verified) Outpatient Encounter Medications as of 12/13/2023  Medication Sig   acetaminophen  (TYLENOL ) 500 MG tablet Take 1,000 mg by mouth every 6 (six) hours as needed for headache.   aspirin  EC 81 MG tablet Take 1 tablet (81 mg total) by mouth daily.   colchicine  0.6 MG tablet TAKE 1 TABLET TWICE DAILY   Cyanocobalamin (VITAMIN B-12 PO) Take 1,000 mcg by mouth in the morning.   doxycycline  (VIBRA -TABS) 100 MG tablet Take 1 tablet (100 mg total) by mouth 2 (two) times daily.   finasteride  (PROSCAR ) 5 MG tablet Take 5 mg by mouth at bedtime.   gabapentin  (NEURONTIN ) 300 MG capsule Take 1-2 capsules (300-600 mg total) by mouth 3 (three) times daily as needed (pain).   hydrALAZINE  (APRESOLINE ) 50 MG tablet Take 1 tablet (50 mg total) by mouth 2 (two) times daily.   Magnesium  Hydroxide (DULCOLAX PO) Take 2 tablets by mouth 2 (two) times daily as needed.   MAGNESIUM  PO Take 400 mg by mouth in the morning.   Multiple Vitamin (MULTIVITAMIN WITH MINERALS) TABS tablet Take 1 tablet by mouth in the morning. One-A-Day  Men Health Multivitamin   Omega-3 Fatty Acids (FISH OIL PO) Take 1,000 mg by mouth in the morning.   rosuvastatin  (CRESTOR ) 5 MG tablet TAKE 1 TABLET (5 MG TOTAL) BY MOUTH EVERY MONDAY, WEDNESDAY, AND FRIDAY.   tamsulosin  (FLOMAX ) 0.4 MG CAPS capsule Take 1 capsule (0.4 mg total) by mouth at bedtime.   Turmeric (QC TUMERIC COMPLEX PO) Take 1 tablet by mouth in the morning.   zinc  sulfate, 50mg  elemental zinc , 220 (50 Zn) MG capsule Take 220 mg by mouth in the  morning.   HYDROcodone -acetaminophen  (NORCO/VICODIN) 5-325 MG tablet Take 1 tablet by mouth every 8 (eight) hours as needed. (Patient not taking: Reported on 12/13/2023)   No facility-administered encounter medications on file as of 12/13/2023.    Allergies (verified) Nitrostat [nitroglycerin], Vancomycin , Ace inhibitors, Mevacor  [lovastatin ], and Solarcaine [benzocaine]   History: Past Medical History:  Diagnosis Date   Arthritis    BPH (benign prostatic hypertrophy)    CHF (congestive heart failure) (HCC)    Elevated PSA    Eye problem 03/06/2016   Dr. 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  Elevated LVEDP.;  09/2020: EF 20-25%.  GR 1 DD   Numbness    hands   Personal history of arthritis    Personal history of other diseases of circulatory system    Pre-diabetes    Sickle cell anemia (HCC)    pt denies   Past Surgical History:  Procedure Laterality Date   ANTERIOR CERVICAL DECOMP/DISCECTOMY FUSION N/A 04/20/2018   Procedure: Anterior Cervical Decompression Fusion - Cervical Three-Cervical Four - Cervical Four-Cervical Five - Cervical Five-Cervical Six;  Surgeon: Joshua Alm RAMAN, MD;  Location: [facility] OR;  Service: Neurosurgery;  Laterality: N/A;  Anterior Cervical Decompression Fusion - Cervical Three-Cervical Four - Cervical Four-Cervical Five - Cervical Five-Cervical Six   APPENDECTOMY  AS CHILD   BACK SURGERY     BIV ICD INSERTION CRT-D N/A 10/27/2021   Procedure: BIV ICD INSERTION CRT-D;  Surgeon: Waddell Danelle ORN, MD;  Location:  [facility] INVASIVE CV LAB;  Service: Cardiovascular;  Laterality: N/A;   CARDIOVASCULAR STRESS TEST  10/05/1998   MILD GLOBAL HYPOKINESIS AND ISCHEMIAIN ANTEROSEPTAL  AT APEX/ EF 42%   CARPAL TUNNEL RELEASE Bilateral    CENTRAL LINE INSERTION  10/28/2021   Procedure: CENTRAL LINE INSERTION;  Surgeon: Inocencio Soyla Lunger, MD;  Location: [facility] INVASIVE CV LAB;  Service: Cardiovascular;;   CENTRAL LINE INSERTION  10/28/2021   Procedure: CENTRAL LINE INSERTION;  Surgeon: Wonda Sharper, MD;  Location: [facility] INVASIVE CV LAB;  Service: Cardiovascular;;   COLONOSCOPY WITH PROPOFOL  N/A 03/14/2022   Procedure: COLONOSCOPY WITH PROPOFOL ;  Surgeon: Shila Gustav GAILS, MD;  Location: [facility] ENDOSCOPY;  Service: Gastroenterology;  Laterality: N/A;   EXCISION MELANOMA LEFT UPPER BACK  10/14/2009   eye lid surgery Bilateral 2018   GOLD SEED IMPLANT N/A 07/23/2021   Procedure: GOLD SEED IMPLANT;  Surgeon: Cam Morene ORN, MD;  Location: [facility] ORS;  Service: Urology;  Laterality: N/A;  KNEE ARTHROSCOPY WITH SUBCHONDROPLASTY Left 04/07/2017   Procedure: LEFT KNEE ARTHROSCOPY WITH PARTIAL MEDIAL MENISCECTOMY AND MEDIAL TIBIAL SUBCHONDROPLASTY;  Surgeon: Vernetta Lonni GRADE, MD;  Location: [facility] ORS;  Service: Orthopedics;  Laterality: Left;   knee injections Bilateral    LEAD REVISION/REPAIR N/A 10/28/2021   Procedure: LEAD REVISION/REPAIR;  Surgeon: Inocencio Soyla Lunger, MD;  Location: [facility] INVASIVE CV LAB;  Service: Cardiovascular;  Laterality: N/A;   LEFT HEART CATH AND CORONARY ANGIOGRAPHY  04/2002   minimal irregularities in the LAD/  EF 50%  -   DR AL LITTLE   LUMBAR DISC SURGERY  09/12/2000   left  L5 -- S1   LUMBAR WOUND DEBRIDEMENT N/A 08/29/2023   Procedure: LUMBAR WOUND IRRIGATION AND DEBRIDEMENT;  Surgeon: Lanis Pupa, MD;  Location: [facility] OR;  Service: Neurosurgery;  Laterality: N/A;  I & D Lumbar wound   PERICARDIOCENTESIS N/A 10/28/2021   Procedure: PERICARDIOCENTESIS;  Surgeon: Inocencio Soyla Lunger, MD;   Location: [facility] INVASIVE CV LAB;  Service: Cardiovascular;  Laterality: N/A;   PERICARDIOCENTESIS N/A 10/28/2021   Procedure: PERICARDIOCENTESIS;  Surgeon: Wonda Sharper, MD;  Location: [facility] INVASIVE CV LAB;  Service: Cardiovascular;  Laterality: N/A;   PROSTATE BIOPSY N/A 05/22/2013   Procedure: BIOPSY TRANSRECTAL ULTRASONIC PROSTATE (TUBP);  Surgeon: Alm GORMAN Fragmin, MD;  Location: [facility];  Service: Urology;  Laterality: N/A;   RIGHT/LEFT HEART CATH AND CORONARY ANGIOGRAPHY N/A 06/25/2020   Procedure: RIGHT/LEFT HEART CATH AND CORONARY ANGIOGRAPHY;  Surgeon: Claudene Victory ORN, MD;  Location: [facility] INVASIVE CV LAB;:; Normal coronary arteries; LVEDP 23 mmHg; PCWP 19 mmHg.   ROTATOR CUFF REPAIR Left 2016   SPACE OAR INSTILLATION N/A 07/23/2021   Procedure: SPACE OAR INSTILLATION;  Surgeon: Cam Morene ORN, MD;  Location: [facility] ORS;  Service: Urology;  Laterality: N/A;   THROAT SURGERY  04/20/2018   TRANSRECTAL ULTRASOUND PROSTATE BX  05-23-2005  &  04-19-2001   TRANSTHORACIC ECHOCARDIOGRAM  09/2017   EF 45-50 % (previously reported as 40 to 45%).  Incoordinate septal motion with mild LVH.  GR 1 DD.  Aortic sclerosis but no stenosis.   TRANSTHORACIC ECHOCARDIOGRAM  10/06/2020   Severely reduced EF of 20 to 25%.  No LV thrombus.  Severe septal-lateral wall systolic dyssynchrony due to LBBB.  Global HK.  Moderately dilated LV.  GR 1 DD with mild LA dilation..  Unable to assess PAP with normal RV size and function.  Normal RAP.  Mild AOV sclerosis but no stenosis.  Mild to moderate MR.   TRANSTHORACIC ECHOCARDIOGRAM  06/16/2020   Severely reduced EF <20%.  Moderate severely dilated LV.  GR 2 DD.  Elevated LVEDP.  Mild LA dilation.  Mildly reduced RV function.  Mild MR.  Mild aortic valve calcification   Family History  Problem Relation Age of Onset   Arthritis Mother    COPD Father    Lung cancer Father    Cancer Sister        type unknown   Breast cancer Sister    Lung cancer Sister     Cardiomyopathy Other        idiopathic. trivial disease 2004 cath, 2010 cardiac CT - no sig. dz, nl LV fxn. 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  Tobacco Use   Smoking status: Former    Current packs/day: 0.00    Average packs/day: 2.0 packs/day for 20.0 years (40.0 ttl pk-yrs)    Types: Cigarettes    Start date: 03/28/1960    Quit date: 03/28/1980    Years since quitting: 43.7   Smokeless tobacco: Never  Vaping Use   Vaping status: Never Used  Substance and Sexual Activity   Alcohol  use: No    Alcohol /week: 0.0 standard drinks of alcohol    Drug use: No   Sexual activity: Not Currently  Other Topics Concern   Not on file  Social History Narrative   He walks on a relatively regular basis about 15 minutes at a time mostly to the discomfort. He previously had walked up a 30-40 minutes a time without problems. Education: [employer].   Lives alone./2025   Right-handed.   One cup coffee daily.   Social Drivers of Corporate investment banker Strain: Low Risk  (12/13/2023)   Overall Financial Resource Strain (CARDIA)    Difficulty of Paying Living Expenses: Not very hard  Food Insecurity: No Food Insecurity (12/13/2023)   Hunger Vital Sign    Worried About Running Out of Food in the Last Year: Never true    Ran Out of Food in the Last Year: Never true  Transportation Needs: No Transportation Needs (12/13/2023)   PRAPARE - Administrator, Civil Service (Medical): No    Lack of Transportation (Non-Medical): No  Physical Activity: Sufficiently Active (12/13/2023)   Exercise Vital Sign    Days of Exercise per Week: 7 days    Minutes of Exercise per Session: 30 min  Stress:  Stress Concern Present (12/13/2023)   [employer] of Occupational Health - Occupational Stress Questionnaire    Feeling of Stress: To some extent  Social Connections: Moderately Integrated (12/13/2023)   Social Connection and Isolation Panel    Frequency of Communication with Friends and Family: More than three times a week    Frequency of Social Gatherings with Friends and Family: Three times a week    Attends Religious Services: More than 4 times per year    Active Member of Clubs or Organizations: Yes    Attends Banker Meetings: More than 4 times per year    Marital Status: Widowed    Tobacco Counseling Counseling given: Not Answered    Clinical Intake:  Pre-visit preparation completed: Yes  Pain : 0-10 Pain Score: 5  Pain Type: Acute pain Pain Location: Back Pain Descriptors / Indicators: Aching, Discomfort Pain Onset: More than a month ago (had a staf infection) Pain Frequency: Intermittent Pain Relieving Factors: Gabapentin /Tylenol  Effect of Pain on Daily Activities: when moving around  Pain Relieving Factors: Gabapentin /Tylenol   BMI - recorded: 28.89 Nutritional Status: BMI 25 -29 Overweight Nutritional Risks: None Diabetes: No  Lab Results  Component Value Date   HGBA1C 5.9 10/18/2023   HGBA1C 6.5 07/12/2023   HGBA1C 6.4 02/06/2023     How often do you need to have someone help you when you read instructions, pamphlets, or other written materials from your doctor or pharmacy?: 1 - Never  Interpreter Needed?: No  Information entered by :: Roosevelt Eimers, RMA   Activities of Daily Living     12/13/2023   10:49 AM 08/29/2023    1:49 PM  In your present state of health, do you have  any difficulty performing the following activities:  Hearing? 1   Comment Has some hearing issues   Vision? 0   Difficulty concentrating or making decisions? 0   Walking or climbing stairs? 0   Dressing or bathing? 0   Doing errands, shopping? 0 0   Preparing Food and eating ? N   Using the Toilet? N   In the past six months, have you accidently leaked urine? N   Do you have problems with loss of bowel control? N   Managing your Medications? N   Managing your Finances? N   Housekeeping or managing your Housekeeping? N     Patient Care Team: Levora Reyes SAUNDERS, MD as PCP - General (Family Medicine) Anner Alm ORN, MD as PCP - Cardiology (Cardiology) Waddell Danelle ORN, MD as PCP - Electrophysiology (Cardiology) Alline Alm, MD (Inactive) (Urology) Teressa Toribio SQUIBB, MD (Inactive) (Gastroenterology) Morgan Sim NOVAK, MD as Attending Physician (Cardiology) Cam Morene ORN, MD as Attending Physician (Urology) Devere Lonni Righter, MD as Consulting Physician (Urology) Crawford, Morna Pickle, NP as Nurse Practitioner (Hematology and Oncology) Starla Wendelyn BIRCH, RN as Registered Nurse Joshua Alm Hamilton, MD as Consulting Physician (Neurosurgery)  I have updated your Care Teams any recent Medical Services you may have received from other providers in the past year.     Assessment:   This is a routine wellness examination for Khalik.  Hearing/Vision screen Hearing Screening - Comments:: Has some hearing issues Vision Screening - Comments:: Wears eyeglasses/ Dr. Octavia   Goals Addressed   None    Depression Screen     12/13/2023   11:13 AM 09/13/2023   10:44 AM 05/24/2023    8:57 AM 04/10/2023    9:10 AM 02/06/2023   10:59 AM 12/01/2022   10:31 AM 11/23/2021    3:14 PM  PHQ 2/9 Scores  PHQ - 2 Score 0 0 2 0 2 0 0  PHQ- 9 Score 0 0 4 1 11 2      Fall Risk     12/13/2023   11:09 AM 09/13/2023   10:39 AM 05/24/2023    8:57 AM 04/10/2023    9:10 AM 12/01/2022   10:22 AM  Fall Risk   Falls in the past year? 0 0 1 0 0  Number falls in past yr: 0 0 1 0 0  Injury with Fall? 0 0 0 0 0  Risk for fall due to :   No Fall Risks No Fall Risks;History of fall(s)   Follow up Falls evaluation completed;Falls prevention discussed   Falls evaluation completed Falls evaluation completed Falls evaluation completed;Education provided;Falls prevention discussed    MEDICARE RISK AT HOME:  Medicare Risk at Home Any stairs in or around the home?: Yes (2 story home) If so, are there any without handrails?: No Home free of loose throw rugs in walkways, pet beds, electrical cords, etc?: Yes Adequate lighting in your home to reduce risk of falls?: Yes Life alert?: No Use of a cane, walker or w/c?: No Grab bars in the bathroom?: Yes Shower chair or bench in shower?: Yes Elevated toilet seat or a handicapped toilet?: Yes  TIMED UP AND GO:  Was the test performed?  No  Cognitive Function: Declined/Normal: No cognitive concerns noted by patient or family. Patient alert, oriented, able to answer questions appropriately and recall recent events. No signs of memory loss or confusion.        12/01/2022   10:33 AM 11/23/2021    3:18 PM 08/08/2019  9:10 AM 08/08/2019    9:08 AM 07/24/2018   11:14 AM  6CIT Screen  What Year? 0 points 0 points 0 points 0 points 0 points  What month? 0 points 0 points 0 points 0 points 0 points  What time? 0 points 0 points 0 points 0 points 0 points  Count back from 20 0 points 0 points 0 points 0 points 0 points  Months in reverse 2 points 0 points 4 points 0 points 0 points  Repeat phrase 4 points 2 points 2 points 0 points 0 points  Total Score 6 points 2 points 6 points 0 points 0 points    Immunizations Immunization History  Administered Date(s) Administered   Fluad Quad(high Dose 65+) 12/24/2021   Hepatitis B 01/14/2011   INFLUENZA, HIGH DOSE SEASONAL PF 01/10/2018, 12/19/2018   Influenza Split 01/14/2011, 01/18/2012   Influenza,inj,Quad PF,6+ Mos 02/04/2013, 12/26/2013, 01/23/2015, 01/22/2016, 12/16/2016   Influenza-Unspecified 12/27/2019, 12/22/2022   PFIZER(Purple Top)SARS-COV-2 Vaccination 04/30/2019, 05/22/2019, 12/31/2019, 07/22/2020   Pfizer(Comirnaty)Fall Seasonal Vaccine 12  years and older 01/03/2022   Pneumococcal Conjugate-13 05/06/2014   Pneumococcal Polysaccharide-23 03/28/2004, 04/29/2009   Tdap 08/11/2009, 06/04/2021   Zoster Recombinant(Shingrix) 06/02/2021   Zoster, Live 06/11/2013    Screening Tests Health Maintenance  Topic Date Due   Zoster Vaccines- Shingrix (2 of 2) 07/28/2021   Influenza Vaccine  10/27/2023   COVID-19 Vaccine (6 - 2025-26 season) 11/27/2023   Medicare Annual Wellness (AWV)  12/12/2024   Colonoscopy  03/15/2027   DTaP/Tdap/Td (3 - Td or Tdap) 06/05/2031   Pneumococcal Vaccine: 50+ Years  Completed   Hepatitis C Screening  Completed   HPV VACCINES  Aged Out   Meningococcal B Vaccine  Aged Out   Hepatitis B Vaccines 19-59 Average Risk  Discontinued    Health Maintenance Items Addressed: See Nurse Notes at the end of this note  Additional Screening:  Vision Screening: Recommended annual ophthalmology exams for early detection of glaucoma and other disorders of the eye. Is the patient up to date with their annual eye exam?  No  Who is the provider or what is the name of the office in which the patient attends annual eye exams? Dr. Octavia  Dental Screening: Recommended annual dental exams for proper oral hygiene  Community Resource Referral / Chronic Care Management: CRR required this visit?  No   CCM required this visit?  No   Plan:    I have personally reviewed and noted the following in the patient's chart:   Medical and social history Use of alcohol , tobacco or illicit drugs  Current medications and supplements including opioid prescriptions. Patient is not currently taking opioid prescriptions. Functional ability and status Nutritional status Physical activity Advanced directives List of other physicians Hospitalizations, surgeries, and ER visits in previous 12 months Vitals Screenings to include cognitive, depression, and falls Referrals and appointments  In addition, I have reviewed and discussed  with patient certain preventive protocols, quality metrics, and best practice recommendations. A written personalized care plan for preventive services as well as general preventive health recommendations were provided to patient.   Mylie Mccurley L Lynsay Fesperman, CMA   12/13/2023   After Visit Summary: (MyChart) Due to this being a telephonic visit, the after visit summary with patients personalized plan was offered to patient via MyChart   Notes: Patient is due for a Flu vaccine and a 2nd Shingrix vaccine.  He had no other concerns to address today.

## 2023-12-18 ENCOUNTER — Other Ambulatory Visit: Payer: Self-pay

## 2023-12-18 ENCOUNTER — Ambulatory Visit (INDEPENDENT_AMBULATORY_CARE_PROVIDER_SITE_OTHER): Admitting: Infectious Diseases

## 2023-12-18 ENCOUNTER — Encounter: Payer: Self-pay | Admitting: Infectious Diseases

## 2023-12-18 VITALS — BP 125/70 | HR 81 | Temp 97.3°F | Ht 68.0 in | Wt 190.0 lb

## 2023-12-18 DIAGNOSIS — Z95810 Presence of automatic (implantable) cardiac defibrillator: Secondary | ICD-10-CM

## 2023-12-18 DIAGNOSIS — Z79899 Other long term (current) drug therapy: Secondary | ICD-10-CM | POA: Diagnosis not present

## 2023-12-18 DIAGNOSIS — T8140XA Infection following a procedure, unspecified, initial encounter: Secondary | ICD-10-CM

## 2023-12-18 DIAGNOSIS — B957 Other staphylococcus as the cause of diseases classified elsewhere: Secondary | ICD-10-CM | POA: Diagnosis not present

## 2023-12-18 DIAGNOSIS — M549 Dorsalgia, unspecified: Secondary | ICD-10-CM | POA: Diagnosis not present

## 2023-12-18 MED ORDER — DOXYCYCLINE HYCLATE 100 MG PO TABS: 100.0000 mg | ORAL_TABLET | Freq: Two times a day (BID) | ORAL | 1 refills | Status: DC

## 2023-12-18 NOTE — Progress Notes (Signed)
 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 12/18/2023   Back pain 10/31/2023   Postoperative infection 10/13/2023   Medication management 10/13/2023   Lower urinary tract symptoms (LUTS) 10/06/2023   Surgical site infection 09/19/2023   Unintentional weight loss 09/19/2023   Myelosuppression 09/19/2023   Postoperative wound breakdown 08/29/2023   S/P lumbar fusion 08/07/2023   Foreign body in right ear 06/09/2023   Impacted cerumen of left ear 06/09/2023   Fatigue 08/17/2022   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   Hx of colonic polyps 01/10/2022   Atrial fibrillation, transient (HCC) 11/02/2021   Hypotension    Chest pain 10/28/2021   Prostate cancer (HCC) 04/02/2021   Chronic HFrEF (heart failure with reduced ejection fraction) (HCC) 05/20/2020   Claudication (HCC) 09/01/2019   S/P cervical spinal fusion 04/20/2018   Paresthesia 01/10/2018   Neck pain 01/10/2018   LBBB (left bundle branch block) 09/07/2017   Other meniscus derangements, posterior horn of medial meniscus, left knee, insufficency fracture medial tibial plateau 04/07/2017   Closed nondisplaced fracture of lateral condyle of left tibia with routine healing 04/07/2017   Status post arthroscopy of left knee 04/07/2017   Insomnia 09/14/2015   Hyperlipidemia with target LDL less than 100 09/11/2014   Rotator cuff tendinitis 04/28/2014   Obesity (BMI 30-39.9) 12/29/2013   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   Exertional shortness of breath 06/26/2012   Polyp of colon, adenomatous 04/27/2012   Elevated PSA 10/20/2011   Hypertriglyceridemia 07/08/2011   Nonischemic cardiomyopathy (HCC) 09/04/2009   Current Outpatient Medications on File Prior to Visit  Medication Sig Dispense Refill   acetaminophen  (TYLENOL ) 500 MG tablet Take 1,000 mg by mouth every 6 (six) hours as needed for headache.     aspirin  EC 81 MG tablet Take  1 tablet (81 mg total) by mouth daily.     colchicine  0.6 MG tablet TAKE 1 TABLET TWICE DAILY 90 tablet 0   Cyanocobalamin (VITAMIN B-12 PO) Take 1,000 mcg by mouth in the morning.     finasteride  (PROSCAR ) 5 MG tablet Take 5 mg by mouth at bedtime.     gabapentin  (NEURONTIN ) 300 MG capsule Take 1-2 capsules (300-600 mg total) by mouth 3 (three) times daily as needed (pain). 540 capsule 1   hydrALAZINE  (APRESOLINE ) 50 MG tablet Take 1 tablet (50 mg total) by mouth 2 (two) times daily. 270 tablet 3   MAGNESIUM  PO Take 400 mg by mouth in the morning.     Multiple Vitamin (MULTIVITAMIN WITH MINERALS) TABS tablet Take 1 tablet by mouth in the morning. One-A-Day Men Health Multivitamin     Omega-3 Fatty Acids (FISH OIL PO) Take 1,000 mg by mouth in the morning.     rosuvastatin  (CRESTOR ) 5 MG tablet TAKE 1 TABLET (5 MG TOTAL) BY MOUTH EVERY MONDAY, WEDNESDAY, AND FRIDAY. 36 tablet 2   tamsulosin  (FLOMAX ) 0.4 MG CAPS capsule Take 1 capsule (0.4 mg total) by mouth at bedtime. 90 capsule 3   Turmeric (QC TUMERIC COMPLEX PO) Take 1 tablet by mouth in the morning.     zinc  sulfate, 50mg  elemental zinc , 220 (50 Zn) MG capsule Take 220 mg by mouth in the morning.     No current facility-administered medications on file prior to visit.   Subjective: Discussed the use of AI scribe software for clinical note transcription with the patient, who gave verbal consent to proceed.   78 Y O Male with h/o CHF,  BPH, Gout, HLD, Non ischemic cardiomyopathy s/p ICD, arthritis, A fib, Prostate ca who had decompressive lumbar laminectomy, PLIF L4-L5 and instrumented posterior fixation complicated by post operative wound infection requiring wound washout on 6/23 ( OR cx growing MR staph lugdunensis) who is here for fu. Initially completed 2 weeks of PO linezolid  which was then transitioned to PO doxycycline  for 4 weeks during last clinic visit with Dr Luiz for concerns of deep wound infection.   7/14 Reports completing  course of PO linezolid  and currently on PO doxycycline   without missing doses or concerns and still has few pills left. Posterior wound has healed. Denies drainage. Denies fevers, chills. Denies nausea, vomiting or diarrhea. Has fu with Dr Joshua in July 29th. He is eager to finish off his antibiotics. Recently seen by Cardiology on 7/3 for ICD/CHF and PCP on 7/11 for LUTs. No other complaints.   8/5 Completed course of PO doxycycline  on 7/19. Seen by PCP on 7/23. Back pain was almost gone until he saw Neurosurgery 7/29 and later had a lot of walking during shopping that day, was in his feet approx 6-7 hrs and he was having difficulty putting things back in his car trunk. He was having difficulty lying down due to back pain, had to take 2 tylenol  pills following day, in the afternoon as well as evening. Back pain goes around his sides. Called Dr Sheryll office but they wanted ID to see him first. He also had some sore throat around the same time back pain started and ? Related. Sore throat improved with gargle. Back pain is better from 8/10 last week to 6/10 this week. He has not been taking any analgesics since last Wednesday. Back does not hurt when still, hurts when walking or standing. He reports back surgery many years ago after which back pain gets exacerbated when standing for long duration.   Denies fever, chills, nausea, vomiting, malaise. Denies weakness, numbness, or tingling. He is able to walk without support.   8/28 Accompanied by wife. Still has back pain 7-9/10 mostly. Back pain is non existent if he stays still or finds a comfortable position if lying down, worsened when standing or with movement. Takes 6 tylenol  and 4 gabapentin  a day. He thinks he might have one time he had fevers but no recorded fevers, chills, nausea, vomiting. He had CT done on 8/24 by neurosurgery and no intervention recommended. Next fu sept 9th. Denies any numbness, tingling or weakness. He lacks energy and becomes  exhausted with minimal exertion and thinks it could be due to low hb. No other complaints.   9/22 Accompanied by wife. Taking PO doxycycline  without missed doses or concerns.  Back pain has significantly improved since starting doxycycline , decreasing to 2-4/10, with mild pain during movement. 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  Saw neurosurgery on 9/9 and reports being placed on a bone stimulator.  He reports neurosurgery is hoping to avoid another surgery and have another follow-up in a month.   Denies fever, chills, malaise, nausea or vomiting or diarrhea.   Review of Systems: all systems reviewed with pertinent positives and negatives as listed above   Past Medical History:  Diagnosis Date   Arthritis    BPH (benign prostatic hypertrophy)    CHF (congestive heart failure) (HCC)    Elevated PSA    Eye problem 03/06/2016   Dr. Etta retinal detachment left eye, no sx done   History of gout    pt 05-20-2013 states stable  History of melanoma excision    2011-- LEFT UPPER BACK   Hyperlipemia    Hypertriglyceridemia    ICD (implantable cardioverter-defibrillator) battery depletion 10/27/2021   Left bundle branch block 08/2017   Concern for LBBB mediated cardiomyopathy   Melanoma (HCC) 2011   back   Nonischemic cardiomyopathy (HCC) 09/2020   08/2009: EF 40-45%, Global HK-> 09/2017 -> EF 45-50% ; 3/'22: EF<20%.  Elevated LVEDP.;  09/2020: EF 20-25%.  GR 1 DD   Numbness    hands   Personal history of arthritis    Personal history of other diseases of circulatory system    Pre-diabetes    Sickle cell anemia (HCC)    pt denies   Past Surgical History:  Procedure Laterality Date   ANTERIOR CERVICAL DECOMP/DISCECTOMY FUSION N/A 04/20/2018   Procedure: Anterior Cervical Decompression Fusion - Cervical Three-Cervical Four - Cervical Four-Cervical Five - Cervical Five-Cervical Six;  Surgeon: Joshua Alm RAMAN, MD;  Location: [facility] OR;  Service: Neurosurgery;  Laterality: N/A;  Anterior  Cervical Decompression Fusion - Cervical Three-Cervical Four - Cervical Four-Cervical Five - Cervical Five-Cervical Six   APPENDECTOMY  AS CHILD   BACK SURGERY     BIV ICD INSERTION CRT-D N/A 10/27/2021   Procedure: BIV ICD INSERTION CRT-D;  Surgeon: Waddell Danelle ORN, MD;  Location: [facility] INVASIVE CV LAB;  Service: Cardiovascular;  Laterality: N/A;   CARDIOVASCULAR STRESS TEST  10/05/1998   MILD GLOBAL HYPOKINESIS AND ISCHEMIAIN ANTEROSEPTAL  AT [facility]PEX/ EF 42%   CARPAL TUNNEL RELEASE Bilateral    CENTRAL LINE INSERTION  10/28/2021   Procedure: CENTRAL LINE INSERTION;  Surgeon: Inocencio Soyla Lunger, MD;  Location: [facility] INVASIVE CV LAB;  Service: Cardiovascular;;   CENTRAL LINE INSERTION  10/28/2021   Procedure: CENTRAL LINE INSERTION;  Surgeon: Wonda Sharper, MD;  Location: [facility] INVASIVE CV LAB;  Service: Cardiovascular;;   COLONOSCOPY WITH PROPOFOL  N/A 03/14/2022   Procedure: COLONOSCOPY WITH PROPOFOL ;  Surgeon: Shila Gustav GAILS, MD;  Location: [facility] ENDOSCOPY;  Service: Gastroenterology;  Laterality: N/A;   EXCISION MELANOMA LEFT UPPER BACK  10/14/2009   eye lid surgery Bilateral 2018   GOLD SEED IMPLANT N/A 07/23/2021   Procedure: GOLD SEED IMPLANT;  Surgeon: Cam Morene ORN, MD;  Location: [facility] ORS;  Service: Urology;  Laterality: N/A;   KNEE ARTHROSCOPY WITH SUBCHONDROPLASTY Left 04/07/2017   Procedure: LEFT KNEE ARTHROSCOPY WITH PARTIAL MEDIAL MENISCECTOMY AND MEDIAL TIBIAL SUBCHONDROPLASTY;  Surgeon: Vernetta Lonni GRADE, MD;  Location: [facility] ORS;  Service: Orthopedics;  Laterality: Left;   knee injections Bilateral    LEAD REVISION/REPAIR N/A 10/28/2021   Procedure: LEAD REVISION/REPAIR;  Surgeon: Inocencio Soyla Lunger, MD;  Location: [facility] INVASIVE CV LAB;  Service: Cardiovascular;  Laterality: N/A;   LEFT HEART CATH AND CORONARY ANGIOGRAPHY  04/2002   minimal irregularities in the LAD/  EF 50%  -   DR AL LITTLE   LUMBAR DISC SURGERY  09/12/2000   left  L5 -- S1   LUMBAR WOUND DEBRIDEMENT  N/A 08/29/2023   Procedure: LUMBAR WOUND IRRIGATION AND DEBRIDEMENT;  Surgeon: Lanis Pupa, MD;  Location: [facility] OR;  Service: Neurosurgery;  Laterality: N/A;  I & D Lumbar wound   PERICARDIOCENTESIS N/A 10/28/2021   Procedure: PERICARDIOCENTESIS;  Surgeon: Inocencio Soyla Lunger, MD;  Location: [facility] INVASIVE CV LAB;  Service: Cardiovascular;  Laterality: N/A;   PERICARDIOCENTESIS N/A 10/28/2021   Procedure: PERICARDIOCENTESIS;  Surgeon: Wonda Sharper, MD;  Location: [facility] INVASIVE CV LAB;  Service: Cardiovascular;  Laterality: N/A;   PROSTATE BIOPSY N/A  05/22/2013   Procedure: BIOPSY TRANSRECTAL ULTRASONIC PROSTATE (TUBP);  Surgeon: Alm GORMAN Fragmin, MD;  Location: [facility];  Service: Urology;  Laterality: N/A;   RIGHT/LEFT HEART CATH AND CORONARY ANGIOGRAPHY N/A 06/25/2020   Procedure: RIGHT/LEFT HEART CATH AND CORONARY ANGIOGRAPHY;  Surgeon: Claudene Victory ORN, MD;  Location: [facility] INVASIVE CV LAB;:; Normal coronary arteries; LVEDP 23 mmHg; PCWP 19 mmHg.   ROTATOR CUFF REPAIR Left 2016   SPACE OAR INSTILLATION N/A 07/23/2021   Procedure: SPACE OAR INSTILLATION;  Surgeon: Cam Morene ORN, MD;  Location: [facility] ORS;  Service: Urology;  Laterality: N/A;   THROAT SURGERY  04/20/2018   TRANSRECTAL ULTRASOUND PROSTATE BX  05-23-2005  &  04-19-2001   TRANSTHORACIC ECHOCARDIOGRAM  09/2017   EF 45-50 % (previously reported as 40 to 45%).  Incoordinate septal motion with mild LVH.  GR 1 DD.  Aortic sclerosis but no stenosis.   TRANSTHORACIC ECHOCARDIOGRAM  10/06/2020   Severely reduced EF of 20 to 25%.  No LV thrombus.  Severe septal-lateral wall systolic dyssynchrony due to LBBB.  Global HK.  Moderately dilated LV.  GR 1 DD with mild LA dilation..  Unable to assess PAP with normal RV size and function.  Normal RAP.  Mild AOV sclerosis but no stenosis.  Mild to moderate MR.   TRANSTHORACIC ECHOCARDIOGRAM  06/16/2020   Severely reduced EF <20%.  Moderate severely dilated LV.  GR 2 DD.  Elevated  LVEDP.  Mild LA dilation.  Mildly reduced RV function.  Mild MR.  Mild aortic valve calcification    Social History   Tobacco Use   Smoking status: Former    Current packs/day: 0.00    Average packs/day: 2.0 packs/day for 20.0 years (40.0 ttl pk-yrs)    Types: Cigarettes    Start date: 03/28/1960    Quit date: 03/28/1980    Years since quitting: 43.7   Smokeless tobacco: Never  Vaping Use   Vaping status: Never Used  Substance Use Topics   Alcohol  use: No    Alcohol /week: 0.0 standard drinks of alcohol    Drug use: No    Family History  Problem Relation Age of Onset   Arthritis Mother    COPD Father    Lung cancer Father    Cancer Sister        type unknown   Breast cancer Sister    Lung cancer Sister    Cardiomyopathy Other        idiopathic. trivial disease 2004 cath, 2010 cardiac CT - no sig. dz, nl LV fxn. cards: Little   Colon cancer Neg Hx     Allergies  Allergen Reactions   Nitrostat [Nitroglycerin] Anaphylaxis    Cardiologist suggested he shouldn't take this med because it could kill him with his heart condition. Lowers BP   Vancomycin  Hives    08/29/23 per confirmation with RN, shortly after starting vancomycin  infusion patient developed itching and a rash on his torso   Ace Inhibitors Swelling   Mevacor  [Lovastatin ] Nausea Only   Solarcaine [Benzocaine] Dermatitis    Health Maintenance  Topic Date Due   Zoster Vaccines- Shingrix (2 of 2) 07/28/2021   Influenza Vaccine  10/27/2023   COVID-19 Vaccine (6 - 2025-26 season) 11/27/2023   Medicare Annual Wellness (AWV)  12/12/2024   Colonoscopy  03/15/2027   DTaP/Tdap/Td (3 - Td or Tdap) 06/05/2031   Pneumococcal Vaccine: 50+ Years  Completed   Hepatitis C Screening  Completed   HPV VACCINES  Aged Out  Meningococcal B Vaccine  Aged Out   Hepatitis B Vaccines 19-59 Average Risk  Discontinued    Objective: BP 125/70   Pulse 81   Temp (!) 97.3 F (36.3 C) (Oral)   Ht 5' 8 (1.727 m)   Wt 190 lb (86.2  kg)   SpO2 97%   BMI 28.89 kg/m    Physical Exam Constitutional:      Appearance: Normal appearance.  HENT:     Head: Normocephalic and atraumatic.      Mouth: Mucous membranes are moist.  Eyes:    Conjunctiva/sclera: Conjunctivae normal.     Pupils: Pupils are equal, round, and b/l symmetrical    Cardiovascular:     Rate and Rhythm: Normal rate and Irregular rhythm.     Heart sounds:  Pulmonary:     Effort: Pulmonary effort is normal.     Breath sounds:  Abdominal:     General: Non distended     Palpations:  Musculoskeletal:        General: Normal range of motion. Ambulatory  Skin:    General: Skin is warm and dry. ICD site OK     Comments: posterior lumbar wound has healed  Neurological:     General: grossly non focal     Mental Status: awake, alert and oriented to person, place, and time.   Psychiatric:        Mood and Affect: Mood normal.   Lab Results Lab Results  Component Value Date   WBC 6.6 11/23/2023   HGB 9.2 (L) 11/23/2023   HCT 30.4 (L) 11/23/2023   MCV 84.7 11/23/2023   PLT 201 11/23/2023    Lab Results  Component Value Date   CREATININE 1.16 10/18/2023   BUN 29 (H) 10/18/2023   NA 138 10/18/2023   K 4.5 10/18/2023   CL 105 10/18/2023   CO2 26 10/18/2023    Lab Results  Component Value Date   ALT 14 10/18/2023   AST 15 10/18/2023   ALKPHOS 78 10/18/2023   BILITOT 0.3 10/18/2023    Lab Results  Component Value Date   CHOL 121 10/18/2023   HDL 46.80 10/18/2023   LDLCALC 58 10/18/2023   TRIG 83.0 10/18/2023   CHOLHDL 3 10/18/2023   No results found for: LABRPR, RPRTITER No results found for: HIV1RNAQUANT, HIV1RNAVL, CD4TABS   Microbiology Results for orders placed or performed in visit on 10/06/23  Urine Culture     Status: None   Collection Time: 10/06/23  2:39 PM   Specimen: Urine  Result Value Ref Range Status   MICRO NUMBER: [id number]  Final   SPECIMEN QUALITY: Adequate  Final   Sample Source URINE  Final    STATUS: FINAL  Final   Result: No Growth  Final   Imaging No results found.   Assessment/Pla # Post op lumbar wound infection  - 6/3 s/p washout, OR cx staph lugdunensis. Per OR note  some thin yellow fluid encountered/no obvious fascial dehiscence. - Initially thought to be superficial and planned for 2 weeks course of PO linezolid  but extended course of antibiotics on 6/18 to 4 more weeks of PO doxycycline   - Reports doxycycline  completed on 7/19.  - CT 8/13 Lucency along the right greater than left L4 screws  of loosening. Infection not excluded. - 11/24/23 started on PO doxycycline  100mg  po bid due to worsening back pain and elevated inflammatory markers  Plan  - ESR and CRP today  - 7/23 CBC and CMP reviewed, will  not repeat today - continue po doxycycline  as is, refills sent - fu with Neurosurgery +/- surgery. I told him his back pain that is better with antibiotics as well as elevated inflammatory markers is concerning for persistent infection and will need to be on prolonged course if does not undergo hardware ( screw) removed - fu in a month   # Back pain - better with antibiotics  - on gabapentin , acetaminophen  - pain management per Nsx  I spent 30 minutes involved in face-to-face and non-face-to-face activities for this patient on the day of the visit. Professional time spent includes the following activities: Preparing to see the patient (review of tests), Performing a medically appropriate examination and evaluation, Reviewing Nsx notes, Ordering labs/medication, Documenting clinical information in the EMR, Independently interpreting results (not separately reported), Counseling and educating the patient/wife  and Care coordination (not separately reported).   Of note, portions of this note may have been created with voice recognition software. While this note has been edited for accuracy, occasional wrong-word or 'sound-a-like' substitutions may have occurred due to the  inherent limitations of voice recognition software.   Annalee Joseph, MD [facility] 12/18/2023, 11:02 AM

## 2023-12-19 ENCOUNTER — Ambulatory Visit: Admitting: Infectious Diseases

## 2023-12-19 LAB — C-REACTIVE PROTEIN: CRP: <3 mg/L (ref <8.0)

## 2023-12-19 LAB — SEDIMENTATION RATE: Sed Rate: 9 mm/h (ref 0–20)

## 2023-12-20 ENCOUNTER — Encounter: Payer: Self-pay | Admitting: Infectious Diseases

## 2023-12-20 DIAGNOSIS — T8140XA Infection following a procedure, unspecified, initial encounter: Secondary | ICD-10-CM

## 2023-12-20 MED ORDER — DOXYCYCLINE HYCLATE 100 MG PO TABS: 100.0000 mg | ORAL_TABLET | Freq: Two times a day (BID) | ORAL | 1 refills | Status: DC

## 2023-12-26 ENCOUNTER — Ambulatory Visit: Payer: Self-pay | Admitting: Infectious Diseases

## 2023-12-26 DIAGNOSIS — H47011 Ischemic optic neuropathy, right eye: Secondary | ICD-10-CM | POA: Diagnosis not present

## 2023-12-26 DIAGNOSIS — H02834 Dermatochalasis of left upper eyelid: Secondary | ICD-10-CM | POA: Diagnosis not present

## 2023-12-26 DIAGNOSIS — H02831 Dermatochalasis of right upper eyelid: Secondary | ICD-10-CM | POA: Diagnosis not present

## 2023-12-26 DIAGNOSIS — H2513 Age-related nuclear cataract, bilateral: Secondary | ICD-10-CM | POA: Diagnosis not present

## 2023-12-27 NOTE — Progress Notes (Signed)
 Remote ICD Transmission

## 2024-01-02 ENCOUNTER — Other Ambulatory Visit: Payer: Self-pay | Admitting: Student

## 2024-01-02 DIAGNOSIS — Z6828 Body mass index (BMI) 28.0-28.9, adult: Secondary | ICD-10-CM | POA: Diagnosis not present

## 2024-01-02 DIAGNOSIS — M4316 Spondylolisthesis, lumbar region: Secondary | ICD-10-CM

## 2024-01-02 DIAGNOSIS — S32009K Unspecified fracture of unspecified lumbar vertebra, subsequent encounter for fracture with nonunion: Secondary | ICD-10-CM | POA: Diagnosis not present

## 2024-01-03 ENCOUNTER — Encounter: Payer: Self-pay | Admitting: Student

## 2024-01-15 ENCOUNTER — Encounter: Payer: Self-pay | Admitting: Family Medicine

## 2024-01-15 ENCOUNTER — Ambulatory Visit: Admitting: Family Medicine

## 2024-01-15 VITALS — BP 118/62 | HR 78 | Temp 98.8°F | Resp 16 | Ht 68.0 in | Wt 193.6 lb

## 2024-01-15 DIAGNOSIS — R5383 Other fatigue: Secondary | ICD-10-CM

## 2024-01-15 DIAGNOSIS — I5022 Chronic systolic (congestive) heart failure: Secondary | ICD-10-CM

## 2024-01-15 DIAGNOSIS — M5481 Occipital neuralgia: Secondary | ICD-10-CM | POA: Insufficient documentation

## 2024-01-15 DIAGNOSIS — Z23 Encounter for immunization: Secondary | ICD-10-CM

## 2024-01-15 DIAGNOSIS — T8149XA Infection following a procedure, other surgical site, initial encounter: Secondary | ICD-10-CM | POA: Diagnosis not present

## 2024-01-15 DIAGNOSIS — D649 Anemia, unspecified: Secondary | ICD-10-CM

## 2024-01-15 DIAGNOSIS — N401 Enlarged prostate with lower urinary tract symptoms: Secondary | ICD-10-CM

## 2024-01-15 DIAGNOSIS — R7303 Prediabetes: Secondary | ICD-10-CM

## 2024-01-15 DIAGNOSIS — Z0000 Encounter for general adult medical examination without abnormal findings: Secondary | ICD-10-CM

## 2024-01-15 DIAGNOSIS — M4322 Fusion of spine, cervical region: Secondary | ICD-10-CM | POA: Insufficient documentation

## 2024-01-15 DIAGNOSIS — S32009K Unspecified fracture of unspecified lumbar vertebra, subsequent encounter for fracture with nonunion: Secondary | ICD-10-CM | POA: Insufficient documentation

## 2024-01-15 LAB — IRON,TIBC AND FERRITIN PANEL
%SAT: 8 % — ABNORMAL LOW (ref 20–48)
Ferritin: 12 ng/mL — ABNORMAL LOW (ref 24–380)
Iron: 34 ug/dL — ABNORMAL LOW (ref 50–180)
TIBC: 442 ug/dL — ABNORMAL HIGH (ref 250–425)

## 2024-01-15 NOTE — Patient Instructions (Addendum)
 Thank you for coming in today.  I will check your blood count and the iron test as that hemoglobin has been low past few months.  That potentially could contribute to fatigue, but I would like to discuss that further with updated labs and can meet in the next 2 weeks to review a plan.  If any new or worsening symptoms in that time be seen.  Keep follow-up with other specialists  as planned.  Take care  Preventive Care [name] [name] and Older, Male Preventive care refers to lifestyle choices and visits with your health care provider that can promote health and wellness. Preventive care visits are also called wellness exams. What can I expect for my preventive care visit? Counseling During your preventive care visit, your health care provider may ask about your: Medical history, including: Past medical problems. Family medical history. History of falls. Current health, including: Emotional well-being. Home life and relationship well-being. Sexual activity. Memory and ability to understand (cognition). Lifestyle, including: Alcohol , nicotine or tobacco, and drug use. Access to firearms. Diet, exercise, and sleep habits. Work and work Astronomer. Sunscreen use. Safety issues such as seatbelt and bike helmet use. Physical exam Your health care provider will check your: Height and weight. These may be used to calculate your BMI (body mass index). BMI is a measurement that tells if you are at a healthy weight. Waist circumference. This measures the distance around your waistline. This measurement also tells if you are at a healthy weight and may help predict your risk of certain diseases, such as type 2 diabetes and high blood pressure. Heart rate and blood pressure. Body temperature. Skin for abnormal spots. What immunizations do I need?  Vaccines are usually given at various ages, according to a schedule. Your health care provider will recommend vaccines for you based on your age, medical  history, and lifestyle or other factors, such as travel or where you work. What tests do I need? Screening Your health care provider may recommend screening tests for certain conditions. This may include: Lipid and cholesterol levels. Diabetes screening. This is done by checking your blood sugar (glucose) after you have not eaten for a while (fasting). Hepatitis C test. Hepatitis B test. HIV (human immunodeficiency virus) test. STI (sexually transmitted infection) testing, if you are at risk. Lung cancer screening. Colorectal cancer screening. Prostate cancer screening. Abdominal aortic aneurysm (AAA) screening. You may need this if you are a current or former smoker. Talk with your health care provider about your test results, treatment options, and if necessary, the need for more tests. Follow these instructions at home: Eating and drinking  Eat a diet that includes fresh fruits and vegetables, whole grains, lean protein, and low-fat dairy products. Limit your intake of foods with high amounts of sugar, saturated fats, and salt. Take vitamin and mineral supplements as recommended by your health care provider. Do not drink alcohol  if your health care provider tells you not to drink. If you drink alcohol : Limit how much you have to 0-2 drinks a day. Know how much alcohol  is in your drink. In the [place] one drink equals one 12 oz bottle of beer (355 mL), one 5 oz glass of wine (148 mL), or one 1 oz glass of hard liquor (44 mL). Lifestyle Brush your teeth every morning and night with fluoride toothpaste. Floss one time each day. Exercise for at least 30 minutes 5 or more days each week. Do not use any products that contain nicotine or tobacco. These  products include cigarettes, chewing tobacco, and vaping devices, such as e-cigarettes. If you need help quitting, ask your health care provider. Do not use drugs. If you are sexually active, practice safe sex. Use a condom or other form of  protection to prevent STIs. Take aspirin  only as told by your health care provider. Make sure that you understand how much to take and what form to take. Work with your health care provider to find out whether it is safe and beneficial for you to take aspirin  daily. Ask your health care provider if you need to take a cholesterol-lowering medicine (statin). Find healthy ways to manage stress, such as: Meditation, yoga, or listening to music. Journaling. Talking to a trusted person. Spending time with friends and family. Safety Always wear your seat belt while driving or riding in a vehicle. Do not drive: If you have been drinking alcohol . Do not ride with someone who has been drinking. When you are tired or distracted. While texting. If you have been using any mind-altering substances or drugs. Wear a helmet and other protective equipment during sports activities. If you have firearms in your house, make sure you follow all gun safety procedures. Minimize exposure to UV radiation to reduce your risk of skin cancer. What's next? Visit your health care provider once a year for an annual wellness visit. Ask your health care provider how often you should have your eyes and teeth checked. Stay up to date on all vaccines. This information is not intended to replace advice given to you by your health care provider. Make sure you discuss any questions you have with your health care provider. Document Revised: 09/09/2020 Document Reviewed: 09/09/2020 Elsevier Patient Education  2024 [employer].

## 2024-01-15 NOTE — Progress Notes (Unsigned)
 Subjective:  Patient ID: [name] [name], male    DOB: 04-Aug-1945  Age: 78 y.o. MRN: [id number]  CC:  Chief Complaint  Patient presents with   Annual Exam    Concerned about hemoglobin. Energy level is low.     HPI [name] [name] presents for Annual Exam Here with friend [name].   PCP, me Infectious disease, Dr. Dea.  Postoperative wound infection with washout from prior PLIF L4-5 and instrumented posterior fixation.  Initially treated with p.o. linezolid  then doxycycline , monthly visits with ID, with some persistent low back pain.  He was started back on doxycycline  with elevated inflammatory markers on 11/24/2023.  Did have follow-up CT late August by neurosurgery without intervention. recommended.  Seen by neurosurgery reportedly on September 9 and placed on bone stimulator.Goals of trying to avoid another injury, 1 month follow-up planned.  Most recent visit with infectious disease September 22, continued on doxycycline .  With improving back pain and inflammatory markers and antibiotics, concern for persistent infection and planned on prolonged course if he did not undergo hardware removal.  1 month ID follow-up. Sed rate improved from 51 in August to 9 in September.  CRP improved from 46 in August to less than 3 in September Neurosurgery, Dr. Alm Molt, surgery May 12, PLIF L4-5.  Admitted June 3 through June 9 for lumbar wound infection.  As above. OV 10/7 - Chart reviewed.  Question lumbar pseudoarthrosis.  Plan for CT of lumbar spine in about 4 weeks in 6-week follow-up, continued bone growth stimulator.  Takes gabapentin  to help manage neck, back pain. 2 pills per day presently. Some increased need with more physical  Cardiology, Dr. Anner, chronic systolic heart failure.HFrEF.  Class II.  Hydralazine  50 mg twice daily.  Crestor  5 mg daily for hyperlipidemia. Intermittent fluid pill few times per week. No new swelling. No new swelling/CP/dyspnea.  Electrophysiology, Dr.  Waddell, history of pacemaker, has ICD- monitoring leads.  Urology, Dr. Cam., Dr. Devere  in November, 2024.  History of prostate cance status post IMRT in 2023, BPH with LUTS, nocturia.  At that time was continued on finasteride   and tamsulosin  PSA every 6 months and office visit in 12 months. Appt. 11/7. Dermatology - hx of skin CA - Reda Piety.   Gout: Last flare: not recent - few months. Resolved with colchicine  - single dose.  Daily meds:none Prn med: Colchicine  as needed, refilled in May.  Lab Results  Component Value Date   LABURIC 6.3 02/26/2021   Prediabetes: Diet controlled.  Lab Results  Component Value Date   HGBA1C 5.9 10/18/2023   Wt Readings from Last 3 Encounters:  01/15/24 193 lb 9.6 oz (87.8 kg)  12/18/23 190 lb (86.2 kg)  12/13/23 190 lb (86.2 kg)   Lab Results  Component Value Date   CHOL 121 10/18/2023   HDL 46.80 10/18/2023   LDLCALC 58 10/18/2023   TRIG 83.0 10/18/2023   CHOLHDL 3 10/18/2023       12/13/2023   11:13 AM 09/13/2023   10:44 AM 05/24/2023    8:57 AM 04/10/2023    9:10 AM 02/06/2023   10:59 AM  Depression screen PHQ 2/9  Decreased Interest 0 0 0 0 2  Down, Depressed, Hopeless 0 0 2 0 0  PHQ - 2 Score 0 0 2 0 2  Altered sleeping 0 0 0 1 3  Tired, decreased energy 0 0 1 0 3  Change in appetite 0 0 1 0 0  Feeling bad  or failure about yourself  0 0 0 0 1  Trouble concentrating 0 0 0 0 1  Moving slowly or fidgety/restless 0 0 0 0 1  Suicidal thoughts 0 0 0 0 0  PHQ-9 Score 0 0 4 1 11   Difficult doing work/chores Not difficult at all Not difficult at all       Health Maintenance  Topic Date Due   Zoster Vaccines- Shingrix (2 of 2) 07/28/2021   Influenza Vaccine  10/27/2023   COVID-19 Vaccine (6 - 2025-26 season) 01/31/2024 (Originally 11/27/2023)   Medicare Annual Wellness (AWV)  12/12/2024   Colonoscopy  03/15/2027   DTaP/Tdap/Td (3 - Td or Tdap) 06/05/2031   Pneumococcal Vaccine: 50+ Years  Completed   Hepatitis C  Screening  Completed   Meningococcal B Vaccine  Aged Out   Hepatitis B Vaccines 19-59 Average Risk  Discontinued  Colonoscopy in 2023.  Prostate: followed by urology as above.    Immunization History  Administered Date(s) Administered   Fluad Quad(high Dose 65+) 12/24/2021   Hepatitis B 01/14/2011   INFLUENZA, HIGH DOSE SEASONAL PF 01/10/2018, 12/19/2018   Influenza Split 01/14/2011, 01/18/2012   Influenza,inj,Quad PF,6+ Mos 02/04/2013, 12/26/2013, 01/23/2015, 01/22/2016, 12/16/2016   Influenza-Unspecified 12/27/2019, 12/22/2022   PFIZER(Purple Top)SARS-COV-2 Vaccination 04/30/2019, 05/22/2019, 12/31/2019, 07/22/2020   Pfizer(Comirnaty)Fall Seasonal Vaccine 12 years and older 01/03/2022   Pneumococcal Conjugate-13 05/06/2014   Pneumococcal Polysaccharide-23 03/28/2004, 04/29/2009   Tdap 08/11/2009, 06/04/2021   Zoster Recombinant(Shingrix) 06/02/2021   Zoster, Live 06/11/2013  Flu vaccine today.  Covid vaccine booster recommended - declines. Will be checking into 2nd shingrix at pharmacy.  Declines RSV vaccine.  Prevnar 20 today.   No results found. Optho yearly. Dr. Medford Gaudy. Recent visit.   Dental: regular care with Dr. Claudene.    Alcohol :none  Tobacco: none  Exercise: walking, cutting wood past few weeks. Some fatigue with activity - 5-8 minutes of activity - past year. More active past month or so.  Low HGB noted - 13.4 prior to surgery. 11.6 on 6/3, 9.7 on 6/4, 10.2 on 7/23, 10.4 on 8/5, 9.2 on 8/28.  No melena, hematochezia.     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   Coagulase-negative staphylococcal infection 12/18/2023   Back pain 10/31/2023   Postoperative  infection 10/13/2023   Medication management 10/13/2023   Lower urinary tract symptoms (LUTS) 10/06/2023   Surgical site infection 09/19/2023   Unintentional weight loss 09/19/2023   Myelosuppression 09/19/2023   Postoperative wound breakdown 08/29/2023   S/P lumbar fusion 08/07/2023   Foreign body in right ear 06/09/2023   Impacted cerumen of left ear 06/09/2023   Fatigue 08/17/2022   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   Hx of colonic polyps 01/10/2022   Atrial fibrillation, transient (HCC) 11/02/2021   Hypotension    Chest pain 10/28/2021   Prostate cancer (HCC) 04/02/2021   Chronic HFrEF (heart failure with reduced ejection fraction) (HCC) 05/20/2020   Claudication 09/01/2019   Numbness of hand 06/07/2018   S/P cervical spinal fusion 04/20/2018   Paresthesia 01/10/2018   Neck pain 01/10/2018   LBBB (left bundle branch block) 09/07/2017   Other meniscus derangements, posterior horn  of medial meniscus, left knee, insufficency fracture medial tibial plateau 04/07/2017   Closed nondisplaced fracture of lateral condyle of left tibia with routine healing 04/07/2017   Status post arthroscopy of left knee 04/07/2017   Insomnia 09/14/2015   Hyperlipidemia with target LDL less than 100 09/11/2014   Rotator cuff tendinitis 04/28/2014   Obesity (BMI 30-39.9) 12/29/2013   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   Exertional shortness of breath 06/26/2012   Polyp of colon, adenomatous 04/27/2012   Elevated PSA 10/20/2011   Hypertriglyceridemia 07/08/2011   Nonischemic cardiomyopathy (HCC) 09/04/2009   Past Medical History:  Diagnosis Date   Arthritis    BPH (benign prostatic hypertrophy)    CHF (congestive heart failure) (HCC)    Elevated PSA    Eye problem 03/06/2016   Dr. Etta retinal detachment left eye, no sx done   History of gout    pt 05-20-2013 states stable    History of melanoma excision    2011-- LEFT UPPER BACK   Hyperlipemia     Hypertriglyceridemia    ICD (implantable cardioverter-defibrillator) battery depletion 10/27/2021   Left bundle branch block 08/2017   Concern for LBBB mediated cardiomyopathy   Melanoma (HCC) 2011   back   Nonischemic cardiomyopathy (HCC) 09/2020   08/2009: EF 40-45%, Global HK-> 09/2017 -> EF 45-50% ; 3/'22: EF<20%.  Elevated LVEDP.;  09/2020: EF 20-25%.  GR 1 DD   Numbness    hands   Personal history of arthritis    Personal history of other diseases of circulatory system    Pre-diabetes    Sickle cell anemia (HCC)    pt denies   Past Surgical History:  Procedure Laterality Date   ANTERIOR CERVICAL DECOMP/DISCECTOMY FUSION N/A 04/20/2018   Procedure: Anterior Cervical Decompression Fusion - Cervical Three-Cervical Four - Cervical Four-Cervical Five - Cervical Five-Cervical Six;  Surgeon: Joshua Alm RAMAN, MD;  Location: [facility] OR;  Service: Neurosurgery;  Laterality: N/A;  Anterior Cervical Decompression Fusion - Cervical Three-Cervical Four - Cervical Four-Cervical Five - Cervical Five-Cervical Six   APPENDECTOMY  AS CHILD   BACK SURGERY     BIV ICD INSERTION CRT-D N/A 10/27/2021   Procedure: BIV ICD INSERTION CRT-D;  Surgeon: Waddell Danelle ORN, MD;  Location: [facility] INVASIVE CV LAB;  Service: Cardiovascular;  Laterality: N/A;   CARDIOVASCULAR STRESS TEST  10/05/1998   MILD GLOBAL HYPOKINESIS AND ISCHEMIAIN ANTEROSEPTAL  AT [facility]/ EF 42%   CARPAL TUNNEL RELEASE Bilateral    CENTRAL LINE INSERTION  10/28/2021   Procedure: CENTRAL LINE INSERTION;  Surgeon: Inocencio Soyla Lunger, MD;  Location: [facility] INVASIVE CV LAB;  Service: Cardiovascular;;   CENTRAL LINE INSERTION  10/28/2021   Procedure: CENTRAL LINE INSERTION;  Surgeon: Wonda Sharper, MD;  Location: [facility] INVASIVE CV LAB;  Service: Cardiovascular;;   COLONOSCOPY WITH PROPOFOL  N/A 03/14/2022   Procedure: COLONOSCOPY WITH PROPOFOL ;  Surgeon: Shila Gustav GAILS, MD;  Location: [facility] ENDOSCOPY;  Service: Gastroenterology;  Laterality: N/A;   EXCISION  MELANOMA LEFT UPPER BACK  10/14/2009   eye lid surgery Bilateral 2018   GOLD SEED IMPLANT N/A 07/23/2021   Procedure: GOLD SEED IMPLANT;  Surgeon: Cam Morene ORN, MD;  Location: [facility] ORS;  Service: Urology;  Laterality: N/A;   KNEE ARTHROSCOPY WITH SUBCHONDROPLASTY Left 04/07/2017   Procedure: LEFT KNEE ARTHROSCOPY WITH PARTIAL MEDIAL MENISCECTOMY AND MEDIAL TIBIAL SUBCHONDROPLASTY;  Surgeon: Vernetta Lonni GRADE, MD;  Location: [facility] ORS;  Service: Orthopedics;  Laterality: Left;   knee injections Bilateral    LEAD REVISION/REPAIR  N/A 10/28/2021   Procedure: LEAD REVISION/REPAIR;  Surgeon: Inocencio Soyla Lunger, MD;  Location: [facility] INVASIVE CV LAB;  Service: Cardiovascular;  Laterality: N/A;   LEFT HEART CATH AND CORONARY ANGIOGRAPHY  04/2002   minimal irregularities in the LAD/  EF 50%  -   DR AL LITTLE   LUMBAR DISC SURGERY  09/12/2000   left  L5 -- S1   LUMBAR WOUND DEBRIDEMENT N/A 08/29/2023   Procedure: LUMBAR WOUND IRRIGATION AND DEBRIDEMENT;  Surgeon: Lanis Pupa, MD;  Location: [facility] OR;  Service: Neurosurgery;  Laterality: N/A;  I & D Lumbar wound   PERICARDIOCENTESIS N/A 10/28/2021   Procedure: PERICARDIOCENTESIS;  Surgeon: Inocencio Soyla Lunger, MD;  Location: [facility] INVASIVE CV LAB;  Service: Cardiovascular;  Laterality: N/A;   PERICARDIOCENTESIS N/A 10/28/2021   Procedure: PERICARDIOCENTESIS;  Surgeon: Wonda Sharper, MD;  Location: [facility] INVASIVE CV LAB;  Service: Cardiovascular;  Laterality: N/A;   PROSTATE BIOPSY N/A 05/22/2013   Procedure: BIOPSY TRANSRECTAL ULTRASONIC PROSTATE (TUBP);  Surgeon: Alm GORMAN Fragmin, MD;  Location: [facility];  Service: Urology;  Laterality: N/A;   RIGHT/LEFT HEART CATH AND CORONARY ANGIOGRAPHY N/A 06/25/2020   Procedure: RIGHT/LEFT HEART CATH AND CORONARY ANGIOGRAPHY;  Surgeon: Claudene Victory ORN, MD;  Location: [facility] INVASIVE CV LAB;:; Normal coronary arteries; LVEDP 23 mmHg; PCWP 19 mmHg.   ROTATOR CUFF REPAIR Left 2016   SPACE OAR  INSTILLATION N/A 07/23/2021   Procedure: SPACE OAR INSTILLATION;  Surgeon: Cam Morene ORN, MD;  Location: [facility] ORS;  Service: Urology;  Laterality: N/A;   THROAT SURGERY  04/20/2018   TRANSRECTAL ULTRASOUND PROSTATE BX  05-23-2005  &  04-19-2001   TRANSTHORACIC ECHOCARDIOGRAM  09/2017   EF 45-50 % (previously reported as 40 to 45%).  Incoordinate septal motion with mild LVH.  GR 1 DD.  Aortic sclerosis but no stenosis.   TRANSTHORACIC ECHOCARDIOGRAM  10/06/2020   Severely reduced EF of 20 to 25%.  No LV thrombus.  Severe septal-lateral wall systolic dyssynchrony due to LBBB.  Global HK.  Moderately dilated LV.  GR 1 DD with mild LA dilation..  Unable to assess PAP with normal RV size and function.  Normal RAP.  Mild AOV sclerosis but no stenosis.  Mild to moderate MR.   TRANSTHORACIC ECHOCARDIOGRAM  06/16/2020   Severely reduced EF <20%.  Moderate severely dilated LV.  GR 2 DD.  Elevated LVEDP.  Mild LA dilation.  Mildly reduced RV function.  Mild MR.  Mild aortic valve calcification   Allergies  Allergen Reactions   Nitrostat [Nitroglycerin] Anaphylaxis    Cardiologist suggested he shouldn't take this med because it could kill him with his heart condition. Lowers BP   Triclosan     Other Reaction(s): severe dermatitis   Vancomycin  Hives    08/29/23 per confirmation with RN, shortly after starting vancomycin  infusion patient developed itching and a rash on his torso   Ace Inhibitors Swelling   Mevacor  [Lovastatin ] Nausea Only   Solarcaine [Benzocaine] Dermatitis   Prior to Admission medications   Medication Sig Start Date End Date Taking? Authorizing Provider  acetaminophen  (TYLENOL ) 500 MG tablet Take 1,000 mg by mouth every 6 (six) hours as needed for headache.   Yes [provider]  aspirin  EC 81 MG tablet Take 1 tablet (81 mg total) by mouth daily. 04/26/18  Yes Costella, Jerrell PARAS, PA-C  colchicine  0.6 MG tablet TAKE 1 TABLET TWICE DAILY 08/25/23  Yes Levora Reyes SAUNDERS, MD   Cyanocobalamin (VITAMIN B-12 PO) Take 1,000  mcg by mouth in the morning.   Yes [provider]  doxycycline  (VIBRA -TABS) 100 MG tablet Take 1 tablet (100 mg total) by mouth 2 (two) times daily. 12/20/23  Yes Dea Shiner, MD  finasteride  (PROSCAR ) 5 MG tablet Take 5 mg by mouth at bedtime.   Yes [provider]  gabapentin  (NEURONTIN ) 300 MG capsule Take 1-2 capsules (300-600 mg total) by mouth 3 (three) times daily as needed (pain). 10/18/23  Yes Levora Reyes SAUNDERS, MD  hydrALAZINE  (APRESOLINE ) 50 MG tablet Take 1 tablet (50 mg total) by mouth 2 (two) times daily. 11/03/23 02/01/24 Yes Anner Alm ORN, MD  MAGNESIUM  PO Take 400 mg by mouth in the morning.   Yes [provider]  Multiple Vitamin (MULTIVITAMIN WITH MINERALS) TABS tablet Take 1 tablet by mouth in the morning. One-A-Day Men Health Multivitamin   Yes [provider]  Omega-3 Fatty Acids (FISH OIL PO) Take 1,000 mg by mouth in the morning.   Yes [provider]  rosuvastatin  (CRESTOR ) 5 MG tablet TAKE 1 TABLET (5 MG TOTAL) BY MOUTH EVERY MONDAY, WEDNESDAY, AND FRIDAY. 11/29/23 02/27/24 Yes West, Katlyn D, NP  tamsulosin  (FLOMAX ) 0.4 MG CAPS capsule Take 1 capsule (0.4 mg total) by mouth at bedtime. 10/18/23  Yes Levora Reyes SAUNDERS, MD  Turmeric (QC TUMERIC COMPLEX PO) Take 1 tablet by mouth in the morning.   Yes [provider]  zinc  sulfate, 50mg  elemental zinc , 220 (50 Zn) MG capsule Take 220 mg by mouth in the morning.   Yes [provider]  hydrochlorothiazide (HYDRODIURIL) 50 MG tablet Take 50 mg by mouth daily.    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  Tobacco Use   Smoking status:  Former    Current packs/day: 0.00    Average packs/day: 2.0 packs/day for 20.0 years (40.0 ttl pk-yrs)    Types: Cigarettes    Start date: 03/28/1960    Quit date: 03/28/1980    Years since quitting: 43.8   Smokeless tobacco: Never  Vaping Use   Vaping status: Never Used  Substance and Sexual Activity   Alcohol  use: No    Alcohol /week: 0.0 standard drinks of alcohol    Drug use: No   Sexual activity: Not Currently  Other Topics Concern   Not on file  Social History Narrative   He walks on a relatively regular basis about 15 minutes at a time mostly to the discomfort. He previously had walked up a 30-40 minutes a time without problems. Education: [employer].   Lives alone./2025   Right-handed.   One cup coffee daily.   Social Drivers of Corporate investment banker Strain: Low Risk  (12/13/2023)   Overall Financial Resource Strain (CARDIA)    Difficulty of Paying Living Expenses: Not very hard  Food Insecurity: No Food Insecurity (12/13/2023)   Hunger Vital Sign    Worried About Running Out of Food in the Last Year: Never true    Ran Out of Food in the Last Year: Never true  Transportation Needs: No Transportation Needs (12/13/2023)   PRAPARE - Administrator, Civil Service (Medical): No    Lack of Transportation (Non-Medical): No  Physical Activity: Sufficiently Active (12/13/2023)   Exercise Vital Sign    Days of Exercise per Week: 7 days    Minutes of Exercise per Session: 30 min  Stress: Stress Concern Present (12/13/2023)   [employer] of Occupational Health - Occupational Stress Questionnaire    Feeling of Stress: To some extent  Social Connections: Moderately Integrated (12/13/2023)   Social Connection and Isolation Panel    Frequency of Communication with Friends and Family: More than three times a week    Frequency of Social Gatherings with Friends and Family: Three times a week    Attends Religious Services: More than 4 times per year    Active Member of  Clubs or Organizations: Yes    Attends Banker Meetings: More than 4 times per year    Marital Status: Widowed  Intimate Partner Violence: Patient Unable To Answer (12/13/2023)   Humiliation, Afraid, Rape, and Kick questionnaire    Fear of Current or Ex-Partner: Patient unable to answer    Emotionally Abused: Patient unable to answer    Physically Abused: Patient unable to answer    Sexually Abused: Patient unable to answer    Review of Systems  13 point review of systems per patient health survey noted.  Negative other than as indicated above or in HPI.   Objective:   Vitals:   01/15/24 1431  BP: 118/62  Pulse: 78  Resp: 16  Temp: 98.8 F (37.1 C)  TempSrc: Temporal  SpO2: 97%  Weight: 193 lb 9.6 oz (87.8 kg)  Height: 5' 8 (1.727 m)   {Vitals History (Optional):23777}  Physical Exam Vitals reviewed.  Constitutional:      Appearance: He is well-developed.  HENT:     Head: Normocephalic and atraumatic.     Right Ear: External ear normal.     Left Ear: External ear normal.  Eyes:     Conjunctiva/sclera: Conjunctivae normal.     Pupils: Pupils are equal, round, and reactive to light.  Neck:     Thyroid : No thyromegaly.  Cardiovascular:     Rate and Rhythm: Normal rate and regular rhythm.     Heart sounds: Normal heart sounds.  Pulmonary:     Effort: Pulmonary effort is normal. No respiratory distress.     Breath sounds: Normal breath sounds. No wheezing.  Abdominal:     General: There is no distension.     Palpations: Abdomen is soft.     Tenderness: There is no abdominal tenderness.  Musculoskeletal:        General: No tenderness. Normal range of motion.     Cervical back: Normal range of motion and neck supple.  Lymphadenopathy:     Cervical: No cervical adenopathy.  Skin:    General: Skin is warm and dry.  Neurological:     Mental Status: He is alert and oriented to person, place, and time.     Deep Tendon Reflexes: Reflexes are normal and  symmetric.  Psychiatric:        Behavior: Behavior normal.        Assessment & Plan:  [name] [name] is a 78 y.o. male . Need for influenza vaccination - Plan: Flu vaccine HIGH DOSE PF(Fluzone Trivalent)   No orders of the defined types were placed in this encounter.  There are no Patient Instructions on file for this visit.    Signed,   Reyes Pines, MD [facility], [facility] 01/15/24 2:35 PM

## 2024-01-16 LAB — CBC
HCT: 32.7 % — ABNORMAL LOW (ref 39.0–52.0)
Hemoglobin: 10.3 g/dL — ABNORMAL LOW (ref 13.0–17.0)
MCHC: 31.5 g/dL (ref 30.0–36.0)
MCV: 77.9 fl — ABNORMAL LOW (ref 78.0–100.0)
Platelets: 128 K/uL — ABNORMAL LOW (ref 150.0–400.0)
RBC: 4.19 Mil/uL — ABNORMAL LOW (ref 4.22–5.81)
RDW: 18.5 % — ABNORMAL HIGH (ref 11.5–15.5)
WBC: 7 K/uL (ref 4.0–10.5)

## 2024-01-22 ENCOUNTER — Ambulatory Visit: Payer: Self-pay | Admitting: Family Medicine

## 2024-01-25 ENCOUNTER — Ambulatory Visit (INDEPENDENT_AMBULATORY_CARE_PROVIDER_SITE_OTHER): Payer: Medicare HMO

## 2024-01-25 DIAGNOSIS — I428 Other cardiomyopathies: Secondary | ICD-10-CM | POA: Diagnosis not present

## 2024-01-25 LAB — CUP PACEART REMOTE DEVICE CHECK
Battery Remaining Longevity: 59 mo
Battery Remaining Percentage: 66 %
Battery Voltage: 2.98 V
Brady Statistic AP VP Percent: 1 %
Brady Statistic AP VS Percent: 1 %
Brady Statistic AS VP Percent: 95 %
Brady Statistic AS VS Percent: 4.2 %
Brady Statistic RA Percent Paced: 1 %
Date Time Interrogation Session: 20251030020015
HighPow Impedance: 72 Ohm
HighPow Impedance: 72 Ohm
Implantable Lead Connection Status: 753985
Implantable Lead Connection Status: 753985
Implantable Lead Connection Status: 753985
Implantable Lead Implant Date: 20230802
Implantable Lead Implant Date: 20230802
Implantable Lead Implant Date: 20230802
Implantable Lead Location: 753859
Implantable Lead Location: 753860
Implantable Lead Location: 753860
Implantable Lead Model: 7122
Implantable Pulse Generator Implant Date: 20230802
Lead Channel Impedance Value: 250 Ohm
Lead Channel Impedance Value: 350 Ohm
Lead Channel Impedance Value: 380 Ohm
Lead Channel Pacing Threshold Amplitude: 0.625 V
Lead Channel Pacing Threshold Amplitude: 0.75 V
Lead Channel Pacing Threshold Amplitude: 2.25 V
Lead Channel Pacing Threshold Pulse Width: 0.05 ms
Lead Channel Pacing Threshold Pulse Width: 0.5 ms
Lead Channel Pacing Threshold Pulse Width: 0.5 ms
Lead Channel Sensing Intrinsic Amplitude: 11.3 mV
Lead Channel Sensing Intrinsic Amplitude: 3.9 mV
Lead Channel Setting Pacing Amplitude: 0.25 V
Lead Channel Setting Pacing Amplitude: 2 V
Lead Channel Setting Pacing Amplitude: 2 V
Lead Channel Setting Pacing Pulse Width: 0.05 ms
Lead Channel Setting Pacing Pulse Width: 0.5 ms
Lead Channel Setting Sensing Sensitivity: 0.5 mV
Pulse Gen Serial Number: 5544603

## 2024-01-29 ENCOUNTER — Encounter: Payer: Self-pay | Admitting: Family Medicine

## 2024-01-29 ENCOUNTER — Ambulatory Visit (INDEPENDENT_AMBULATORY_CARE_PROVIDER_SITE_OTHER): Admitting: Family Medicine

## 2024-01-29 VITALS — BP 124/70 | HR 92 | Temp 98.5°F | Resp 18 | Ht 68.0 in | Wt 194.8 lb

## 2024-01-29 DIAGNOSIS — R5383 Other fatigue: Secondary | ICD-10-CM

## 2024-01-29 DIAGNOSIS — I5022 Chronic systolic (congestive) heart failure: Secondary | ICD-10-CM | POA: Diagnosis not present

## 2024-01-29 DIAGNOSIS — R0609 Other forms of dyspnea: Secondary | ICD-10-CM

## 2024-01-29 DIAGNOSIS — G4700 Insomnia, unspecified: Secondary | ICD-10-CM

## 2024-01-29 DIAGNOSIS — D509 Iron deficiency anemia, unspecified: Secondary | ICD-10-CM | POA: Diagnosis not present

## 2024-01-29 MED ORDER — TRAZODONE HCL 50 MG PO TABS: 25.0000 mg | ORAL_TABLET | Freq: Every evening | ORAL | 3 refills | Status: AC | PRN

## 2024-01-29 NOTE — Progress Notes (Signed)
 Subjective:  Patient ID: [name] [name], male    DOB: Jul 28, 1945  Age: 78 y.o. MRN: [id number]  CC:  Chief Complaint  Patient presents with   Fatigue    Pt notes has been a bit better with fatigue, notes some issues sleeping at night, can't stay asleep     HPI [name] [name] presents for   Fatigue, anemia Follow-up annual physical exam October 20.  Anemia noted after surgery.  Some fatigue with activity over the previous year.  Had been more active over the previous months or so.  Hemoglobin was 13.4 prior to surgery, 11.6 on June 3, 9.7 on August 29, 2008 0.2 on October 17, 2008 0.4 on August 5, 9.2 on August 28.  He denied melena, hematochezia last visit.  Repeat hemoglobin of 10.3 on October 20 with Iron deficiency, 34, TIBC 442, 8% sat and ferritin of 12.  Recommended iron supplementation once per day. He does have a history of chronic systolic heart failure, HFrEF, class II, and pacemaker.Echocardiogram in April, with low EF of 20 to 25%.  Colonoscopy in December 2023.  Moderate diverticulosis, nonbleeding external and internal hemorrhoids, no specimens collected, no further colonoscopy indicated.  Since last visit he reports that fatigue has been slightly better.  Has not yet started iron yet. Wanted to make sure ok to take.  Decreased energy with activity. No chest pains, but feels winded with activity. No calf swelling.  Some trouble with sleep at night - some chronic issues. Sometimes 3-4 hours per night in the past when working. Has been taking benadryl  2 per night for past year - has been effective until recently.  Past few weeks - waking up after 1.5 -2 hours, benadryl  not working as well. and trouble getting back to sleep. No orthopnea or PND. Denies new stressors/anxiety.  Nocturia 2-3 times per night. Chronic - no changes. Takes finasteride  5mg  at bedtime, and tamsulosin  0.4mg . has been told by urology only other option would be surgical. Taking 1 gabapentin  at night.  Prior 2. Does have some neck pain at night.  Has been using bone stim unit 2 hours every day.    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   Coagulase-negative staphylococcal infection 12/18/2023   Back pain 10/31/2023   Postoperative infection 10/13/2023   Medication management 10/13/2023   Lower urinary tract symptoms (LUTS) 10/06/2023   Surgical site infection 09/19/2023   Unintentional weight loss 09/19/2023   Myelosuppression 09/19/2023   Postoperative wound breakdown 08/29/2023   S/P lumbar fusion 08/07/2023   Foreign body in right ear 06/09/2023   Impacted cerumen of left ear 06/09/2023   Fatigue 08/17/2022   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   Hx of colonic polyps 01/10/2022   Atrial fibrillation, transient (HCC) 11/02/2021   Hypotension    Chest pain 10/28/2021   Prostate cancer (HCC) 04/02/2021   Chronic HFrEF (heart failure with reduced ejection fraction) (HCC) 05/20/2020   Claudication 09/01/2019   Numbness of hand 06/07/2018   S/P cervical spinal fusion 04/20/2018   Paresthesia 01/10/2018   Neck pain 01/10/2018   LBBB (left bundle branch block) 09/07/2017   Other meniscus derangements, posterior horn of medial meniscus, left knee, insufficency fracture medial tibial plateau 04/07/2017   Closed nondisplaced fracture of lateral condyle of left tibia with routine healing 04/07/2017   Status post arthroscopy of left knee 04/07/2017  Insomnia 09/14/2015   Hyperlipidemia with target LDL less than 100 09/11/2014   Rotator cuff tendinitis 04/28/2014   Obesity (BMI 30-39.9) 12/29/2013   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  Elevated LVEDP.;  09/2020: EF 20-25%.  GR 1 DD   Numbness    hands   Personal history of arthritis    Personal history of other diseases of circulatory system    Pre-diabetes    Sickle cell anemia (HCC)    pt denies   Past Surgical History:  Procedure Laterality Date   ANTERIOR CERVICAL DECOMP/DISCECTOMY FUSION N/A 04/20/2018   Procedure: Anterior Cervical Decompression Fusion - Cervical Three-Cervical Four - Cervical Four-Cervical Five - Cervical Five-Cervical Six;  Surgeon: Joshua Alm RAMAN, MD;  Location: [facility] OR;  Service: Neurosurgery;  Laterality: N/A;  Anterior Cervical Decompression Fusion - Cervical Three-Cervical Four - Cervical Four-Cervical Five - Cervical Five-Cervical Six   APPENDECTOMY  AS CHILD   BACK SURGERY     BIV ICD INSERTION CRT-D N/A 10/27/2021   Procedure: BIV ICD INSERTION CRT-D;  Surgeon: Waddell Danelle ORN, MD;  Location: [facility] INVASIVE CV LAB;  Service: Cardiovascular;  Laterality: N/A;   CARDIOVASCULAR STRESS TEST  10/05/1998   MILD GLOBAL HYPOKINESIS AND ISCHEMIAIN ANTEROSEPTAL  AT [facility]/ EF 42%   CARPAL TUNNEL RELEASE Bilateral    CENTRAL LINE INSERTION  10/28/2021   Procedure: CENTRAL LINE INSERTION;  Surgeon: Inocencio Soyla Lunger, MD;  Location: [facility] INVASIVE CV LAB;  Service:  Cardiovascular;;   CENTRAL LINE INSERTION  10/28/2021   Procedure: CENTRAL LINE INSERTION;  Surgeon: Wonda Sharper, MD;  Location: [facility] INVASIVE CV LAB;  Service: Cardiovascular;;   COLONOSCOPY WITH PROPOFOL  N/A 03/14/2022   Procedure: COLONOSCOPY WITH PROPOFOL ;  Surgeon: Shila Gustav GAILS, MD;  Location: [facility] ENDOSCOPY;  Service: Gastroenterology;  Laterality: N/A;   EXCISION MELANOMA LEFT UPPER BACK  10/14/2009   eye lid surgery Bilateral 2018   GOLD SEED IMPLANT N/A 07/23/2021   Procedure: GOLD SEED IMPLANT;  Surgeon: Cam Morene ORN, MD;  Location: [facility] ORS;  Service: Urology;  Laterality: N/A;   KNEE ARTHROSCOPY WITH SUBCHONDROPLASTY Left 04/07/2017   Procedure: LEFT KNEE ARTHROSCOPY WITH PARTIAL MEDIAL MENISCECTOMY AND MEDIAL TIBIAL SUBCHONDROPLASTY;  Surgeon: Vernetta Lonni GRADE, MD;  Location: [facility] ORS;  Service: Orthopedics;  Laterality: Left;   knee injections Bilateral    LEAD REVISION/REPAIR N/A 10/28/2021   Procedure: LEAD REVISION/REPAIR;  Surgeon: Inocencio Soyla Lunger, MD;  Location: [facility] INVASIVE CV LAB;  Service: Cardiovascular;  Laterality: N/A;   LEFT HEART CATH AND CORONARY ANGIOGRAPHY  04/2002  minimal irregularities in the LAD/  EF 50%  -   DR AL LITTLE   LUMBAR DISC SURGERY  09/12/2000   left  L5 -- S1   LUMBAR WOUND DEBRIDEMENT N/A 08/29/2023   Procedure: LUMBAR WOUND IRRIGATION AND DEBRIDEMENT;  Surgeon: Lanis Pupa, MD;  Location: [facility] OR;  Service: Neurosurgery;  Laterality: N/A;  I & D Lumbar wound   PERICARDIOCENTESIS N/A 10/28/2021   Procedure: PERICARDIOCENTESIS;  Surgeon: Inocencio Soyla Lunger, MD;  Location: [facility] INVASIVE CV LAB;  Service: Cardiovascular;  Laterality: N/A;   PERICARDIOCENTESIS N/A 10/28/2021   Procedure: PERICARDIOCENTESIS;  Surgeon: Wonda Sharper, MD;  Location: [facility] INVASIVE CV LAB;  Service: Cardiovascular;  Laterality: N/A;   PROSTATE BIOPSY N/A 05/22/2013   Procedure: BIOPSY TRANSRECTAL ULTRASONIC PROSTATE (TUBP);  Surgeon: Alm GORMAN Fragmin, MD;  Location: [facility];  Service: Urology;  Laterality: N/A;   RIGHT/LEFT HEART CATH AND CORONARY ANGIOGRAPHY N/A 06/25/2020   Procedure: RIGHT/LEFT HEART CATH AND CORONARY ANGIOGRAPHY;  Surgeon: Claudene Victory ORN, MD;  Location: [facility] INVASIVE CV LAB;:; Normal coronary arteries; LVEDP 23 mmHg; PCWP 19 mmHg.   ROTATOR CUFF REPAIR Left 2016   SPACE OAR INSTILLATION N/A 07/23/2021   Procedure: SPACE OAR INSTILLATION;  Surgeon: Cam Morene ORN, MD;  Location: [facility] ORS;  Service: Urology;  Laterality: N/A;   THROAT SURGERY  04/20/2018   TRANSRECTAL ULTRASOUND PROSTATE BX  05-23-2005  &  04-19-2001   TRANSTHORACIC ECHOCARDIOGRAM  09/2017   EF 45-50 % (previously reported as 40 to 45%).  Incoordinate septal motion with mild LVH.  GR 1 DD.  Aortic sclerosis but no stenosis.   TRANSTHORACIC ECHOCARDIOGRAM  10/06/2020   Severely reduced EF of 20 to 25%.  No LV thrombus.  Severe septal-lateral wall systolic dyssynchrony due to LBBB.  Global HK.  Moderately dilated LV.  GR 1 DD with mild LA dilation..  Unable to assess PAP with normal RV size and function.  Normal RAP.  Mild AOV sclerosis but no stenosis.  Mild to moderate MR.   TRANSTHORACIC ECHOCARDIOGRAM  06/16/2020   Severely reduced EF <20%.  Moderate severely dilated LV.  GR 2 DD.  Elevated LVEDP.  Mild LA dilation.  Mildly reduced RV function.  Mild MR.  Mild aortic valve calcification   Allergies  Allergen Reactions   Nitrostat [Nitroglycerin] Anaphylaxis    Cardiologist suggested he shouldn't take this med because it could kill him with his heart condition. Lowers BP   Triclosan     Other Reaction(s): severe dermatitis   Vancomycin  Hives    08/29/23 per confirmation with RN, shortly after starting vancomycin  infusion patient developed itching and a rash on his torso   Ace Inhibitors Swelling   Mevacor  [Lovastatin ] Nausea Only   Solarcaine [Benzocaine] Dermatitis   Prior to Admission medications   Medication Sig Start  Date End Date Taking? Authorizing Provider  acetaminophen  (TYLENOL ) 500 MG tablet Take 1,000 mg by mouth every 6 (six) hours as needed for headache.   Yes [provider]  aspirin  EC 81 MG tablet Take 1 tablet (81 mg total) by mouth daily. 04/26/18  Yes Costella, Jerrell PARAS, PA-C  colchicine  0.6 MG tablet TAKE 1 TABLET TWICE DAILY 08/25/23  Yes Levora Reyes SAUNDERS, MD  Cyanocobalamin (VITAMIN B-12 PO) Take 1,000 mcg by mouth in the morning.   Yes [provider]  doxycycline  (VIBRA -TABS) 100 MG tablet Take 1 tablet (100 mg total) by mouth 2 (two) times daily. 12/20/23  Yes Dea Shiner, MD  finasteride  (PROSCAR ) 5 MG tablet Take 5 mg by mouth at bedtime.   Yes [provider]  gabapentin  (NEURONTIN ) 300 MG capsule Take 1-2 capsules (300-600 mg total) by mouth 3 (three) times daily as needed (pain). 10/18/23  Yes Levora Reyes SAUNDERS, MD  hydrALAZINE  (APRESOLINE ) 50 MG tablet Take 1 tablet (50 mg total) by mouth 2 (two) times daily. 11/03/23 02/01/24 Yes Anner Alm ORN, MD  hydrochlorothiazide (HYDRODIURIL) 50 MG tablet Take 50 mg by mouth daily.   Yes [provider]  MAGNESIUM  PO Take 400 mg by mouth in the morning.   Yes [provider]  Multiple Vitamin (MULTIVITAMIN WITH MINERALS) TABS tablet Take 1 tablet by mouth in the morning. One-A-Day Men Health Multivitamin   Yes [provider]  Omega-3 Fatty Acids (FISH OIL PO) Take 1,000 mg by mouth in the morning.   Yes [provider]  rosuvastatin  (CRESTOR ) 5 MG tablet TAKE 1 TABLET (5 MG TOTAL) BY MOUTH EVERY MONDAY, WEDNESDAY, AND FRIDAY. 11/29/23 02/27/24 Yes West, Katlyn D, NP  tamsulosin  (FLOMAX ) 0.4 MG CAPS capsule Take 1 capsule (0.4 mg total) by mouth at bedtime. 10/18/23  Yes Levora Reyes SAUNDERS, MD  Turmeric (QC TUMERIC COMPLEX PO) Take 1 tablet by mouth in the morning.   Yes [provider]  zinc  sulfate, 50mg  elemental zinc , 220 (50 Zn) MG capsule Take 220 mg by mouth in the  morning.   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  Tobacco Use   Smoking status: Former    Current packs/day: 0.00    Average packs/day: 2.0 packs/day for 20.0 years (40.0 ttl pk-yrs)    Types: Cigarettes    Start date: 03/28/1960    Quit date: 03/28/1980    Years since quitting: 43.8   Smokeless tobacco: Never  Vaping Use   Vaping status: Never Used  Substance and Sexual Activity   Alcohol  use: No    Alcohol /week: 0.0 standard drinks of alcohol    Drug use: No   Sexual activity: Not Currently  Other Topics Concern   Not on file  Social History Narrative   He walks on a relatively regular basis about 15 minutes at a time mostly to the discomfort. He previously had walked up a 30-40 minutes a time without problems. Education: [employer].   Lives alone./2025   Right-handed.   One cup coffee daily.   Social Drivers of Corporate Investment Banker Strain: Low Risk  (12/13/2023)   Overall Financial Resource Strain (CARDIA)    Difficulty of Paying Living Expenses: Not very hard  Food Insecurity: No Food Insecurity (12/13/2023)   Hunger Vital Sign    Worried About Running Out of Food in the Last Year: Never true    Ran Out of Food in the Last Year: Never true  Transportation Needs: No Transportation Needs (12/13/2023)   PRAPARE - Administrator, Civil Service (Medical): No    Lack of Transportation (Non-Medical): No  Physical Activity: Sufficiently Active (12/13/2023)   Exercise Vital Sign    Days of Exercise per Week: 7 days    Minutes of Exercise per Session: 30 min  Stress: Stress Concern Present (12/13/2023)  [employer] of Occupational Health - Occupational Stress  Questionnaire    Feeling of Stress: To some extent  Social Connections: Moderately Integrated (12/13/2023)   Social Connection and Isolation Panel    Frequency of Communication with Friends and Family: More than three times a week    Frequency of Social Gatherings with Friends and Family: Three times a week    Attends Religious Services: More than 4 times per year    Active Member of Clubs or Organizations: Yes    Attends Banker Meetings: More than 4 times per year    Marital Status: Widowed  Intimate Partner Violence: Patient Unable To Answer (12/13/2023)   Humiliation, Afraid, Rape, and Kick questionnaire    Fear of Current or Ex-Partner: Patient unable to answer    Emotionally Abused: Patient unable to answer    Physically Abused: Patient unable to answer    Sexually Abused: Patient unable to answer    Review of Systems Per HPI.   Objective:   Vitals:   01/29/24 1019  BP: 124/70  Pulse: 92  Resp: 18  Temp: 98.5 F (36.9 C)  TempSrc: Temporal  SpO2: 97%  Weight: 194 lb 12.8 oz (88.4 kg)  Height: 5' 8 (1.727 m)     Physical Exam Vitals reviewed.  Constitutional:      Appearance: He is well-developed. He is not ill-appearing or diaphoretic.     Comments: No distress, appears euvolemic.  HENT:     Head: Normocephalic and atraumatic.  Neck:     Vascular: No carotid bruit or JVD.  Cardiovascular:     Rate and Rhythm: Normal rate and regular rhythm.     Heart sounds: Normal heart sounds. No murmur heard. Pulmonary:     Effort: Pulmonary effort is normal.     Breath sounds: Normal breath sounds. No rales.  Musculoskeletal:     Right lower leg: No edema.     Left lower leg: No edema.  Skin:    General: Skin is warm and dry.  Neurological:     Mental Status: He is alert and oriented to person, place, and time.  Psychiatric:        Mood and Affect: Mood normal.        Assessment & Plan:  Islam Eichinger is a 78 y.o. male . Fatigue,  unspecified type DOE (dyspnea on exertion) Chronic HFrEF (heart failure with reduced ejection fraction) (HCC) Iron deficiency anemia, unspecified iron deficiency anemia type  - Based on his description of symptoms and his history of HFrEF, last echo with 20 to 25% EF, I am suspicious of some of his fatigue and winded feeling with exertion could be related to heart failure.  Recommend he discuss with his cardiologist.  Appears euvolemic on exam.  He does have anemia as above, iron deficiency, colonoscopy 2 years ago without concerns.  Has not yet started iron, recheck in 1 month after supplementation and then can decide if further evaluation or other testing needed.  RTC precautions given.  Insomnia, unspecified type - Plan: traZODone (DESYREL) 50 MG tablet  - Possibly multifactorial with difficulty with sleep onset, chronic neck, back pain, and nocturia with enlarged prostate.  He does note at the end of visit he will be seen by his urologist soon and can discuss alternate treatments but it sounds like surgery was recommended previously.  Held on doses changes on his Proscar , tamsulosin .  Recommended increasing gabapentin  to 2 pills at night to see if the neck  or back may be contributing to his wakening.  Benadryl  has been effective previously, but if no change with higher dose gabapentin , stop Benadryl  and try trazodone.  Printed prescription given.  Recheck 1 month with RTC precautions if new/worsening symptoms sooner.  Meds ordered this encounter  Medications   traZODone (DESYREL) 50 MG tablet    Sig: Take 0.5-1 tablets (25-50 mg total) by mouth at bedtime as needed for sleep.    Dispense:  30 tablet    Refill:  3   Patient Instructions  Take iron once per day and we can recheck labs in 1 month.  I recommend discussing fatigue and winded feeling with Dr. Anner as your heart failure could be contributing to your fatigue and feeling winded with exercise.  Although we will treat the anemia as  above, as that would less likely be the primary cause of your symptoms based on the last level.  For sleep, multiple different causes could be affecting your sleep. Try returning to 2 gabapentin  at night to see if neck/back may be part of reason of sleep issues.  If that is not effective, then you can try filling the new sleep medication trazodone, start with 1/2 pill at bedtime, but can take up to 1 full pill if needed.  Do not combine this with Benadryl . If continued nighttime wakening due to prostate/urination, please call and discuss other options with urology.   Recheck in 1 month but see me sooner if any new or worsening symptoms.  Take care and thank you for coming in today.      Signed,   Reyes Pines, MD [facility], [facility] 01/29/24 11:30 AM

## 2024-01-29 NOTE — Progress Notes (Signed)
 LVM to call office about lab results    Platelets were borderline low but hemoglobin has improved.  Iron levels are low.  Start iron supplement once per day, recheck next few weeks and let me know if there are questions in the meantime.   Dr. Levora Written by Reyes JONELLE Levora, MD

## 2024-01-29 NOTE — Patient Instructions (Addendum)
 Take iron once per day and we can recheck labs in 1 month.  I recommend discussing fatigue and winded feeling with Dr. Anner as your heart failure could be contributing to your fatigue and feeling winded with exercise.  Although we will treat the anemia as above, as that would less likely be the primary cause of your symptoms based on the last level.  For sleep, multiple different causes could be affecting your sleep. Try returning to 2 gabapentin  at night to see if neck/back may be part of reason of sleep issues.  If that is not effective, then you can try filling the new sleep medication trazodone, start with 1/2 pill at bedtime, but can take up to 1 full pill if needed.  Do not combine this with Benadryl . If continued nighttime wakening due to prostate/urination, please call and discuss other options with urology.   Recheck in 1 month but see me sooner if any new or worsening symptoms.  Take care and thank you for coming in today.

## 2024-01-30 ENCOUNTER — Ambulatory Visit
Admission: RE | Admit: 2024-01-30 | Discharge: 2024-01-30 | Disposition: A | Source: Ambulatory Visit | Attending: Student

## 2024-01-30 ENCOUNTER — Ambulatory Visit: Admitting: Infectious Diseases

## 2024-01-30 DIAGNOSIS — M4316 Spondylolisthesis, lumbar region: Secondary | ICD-10-CM | POA: Diagnosis not present

## 2024-01-30 DIAGNOSIS — M5126 Other intervertebral disc displacement, lumbar region: Secondary | ICD-10-CM | POA: Diagnosis not present

## 2024-01-30 DIAGNOSIS — M47816 Spondylosis without myelopathy or radiculopathy, lumbar region: Secondary | ICD-10-CM | POA: Diagnosis not present

## 2024-01-30 DIAGNOSIS — M48061 Spinal stenosis, lumbar region without neurogenic claudication: Secondary | ICD-10-CM | POA: Diagnosis not present

## 2024-01-30 NOTE — Progress Notes (Signed)
 2nd attempt to reach patient. [facility]. Please relay lab results if patient calls back

## 2024-01-30 NOTE — Telephone Encounter (Signed)
 Copied from CRM [id number]. Topic: Clinical - Lab/Test Results >> Jan 30, 2024 12:36 PM Macario HERO wrote: Reason for CRM: Patient called regarding lab results. Relayed message and patient said he would like for us  to send him a copy, advised on MyChart, he said he would like a hard copy mailed to home.

## 2024-01-30 NOTE — Progress Notes (Signed)
 Remote ICD Transmission

## 2024-02-01 ENCOUNTER — Ambulatory Visit: Payer: Self-pay | Admitting: Internal Medicine

## 2024-02-02 DIAGNOSIS — R3915 Urgency of urination: Secondary | ICD-10-CM | POA: Diagnosis not present

## 2024-02-02 DIAGNOSIS — C61 Malignant neoplasm of prostate: Secondary | ICD-10-CM | POA: Diagnosis not present

## 2024-02-02 DIAGNOSIS — N401 Enlarged prostate with lower urinary tract symptoms: Secondary | ICD-10-CM | POA: Diagnosis not present

## 2024-02-09 ENCOUNTER — Other Ambulatory Visit: Payer: Self-pay | Admitting: Infectious Diseases

## 2024-02-09 DIAGNOSIS — T8140XA Infection following a procedure, unspecified, initial encounter: Secondary | ICD-10-CM

## 2024-02-09 NOTE — Telephone Encounter (Signed)
 Dispensed 02/02/24, additional refills pending 11/21 appt.   Brylea Pita, BSN, RN

## 2024-02-12 ENCOUNTER — Other Ambulatory Visit: Payer: Self-pay | Admitting: Infectious Diseases

## 2024-02-12 DIAGNOSIS — T8140XA Infection following a procedure, unspecified, initial encounter: Secondary | ICD-10-CM

## 2024-02-12 NOTE — Telephone Encounter (Signed)
 Dispensed 11/7, additional refills pending 11/21 appointment.   Tyberius Ryner, BSN, RN

## 2024-02-13 DIAGNOSIS — T148XXA Other injury of unspecified body region, initial encounter: Secondary | ICD-10-CM | POA: Diagnosis not present

## 2024-02-13 DIAGNOSIS — Z6828 Body mass index (BMI) 28.0-28.9, adult: Secondary | ICD-10-CM | POA: Diagnosis not present

## 2024-02-16 ENCOUNTER — Other Ambulatory Visit: Payer: Self-pay

## 2024-02-16 ENCOUNTER — Ambulatory Visit: Admitting: Infectious Diseases

## 2024-02-16 VITALS — BP 101/63 | HR 118 | Temp 98.1°F | Wt 195.0 lb

## 2024-02-16 DIAGNOSIS — Z95810 Presence of automatic (implantable) cardiac defibrillator: Secondary | ICD-10-CM | POA: Diagnosis not present

## 2024-02-16 DIAGNOSIS — B957 Other staphylococcus as the cause of diseases classified elsewhere: Secondary | ICD-10-CM

## 2024-02-16 DIAGNOSIS — Z7185 Encounter for immunization safety counseling: Secondary | ICD-10-CM | POA: Insufficient documentation

## 2024-02-16 DIAGNOSIS — M48061 Spinal stenosis, lumbar region without neurogenic claudication: Secondary | ICD-10-CM | POA: Insufficient documentation

## 2024-02-16 DIAGNOSIS — Z5181 Encounter for therapeutic drug level monitoring: Secondary | ICD-10-CM | POA: Insufficient documentation

## 2024-02-16 DIAGNOSIS — T8131XD Disruption of external operation (surgical) wound, not elsewhere classified, subsequent encounter: Secondary | ICD-10-CM | POA: Diagnosis not present

## 2024-02-16 MED ORDER — DOXYCYCLINE HYCLATE 100 MG PO TABS: 100.0000 mg | ORAL_TABLET | Freq: Two times a day (BID) | ORAL | 1 refills | Status: AC

## 2024-02-16 NOTE — Patient Instructions (Signed)
 Things to know  1. doxycycline  can cause photosensitivity/sunburn and to wear sunscreen when outdoors 2. Pt to take MVI, Ca, Fe, Mg, Zn at least 2 hours before taking doxycycline  to prevent decreased absorption of doxycycline  3. To take with full glass of water  and remain upright for at least 30 mins after administration

## 2024-02-16 NOTE — Progress Notes (Signed)
 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 12/18/2023   Back pain 10/31/2023   Postoperative infection 10/13/2023   Medication management 10/13/2023   Lower urinary tract symptoms (LUTS) 10/06/2023   Surgical site infection 09/19/2023   Unintentional weight loss 09/19/2023   Myelosuppression 09/19/2023   Postoperative wound breakdown 08/29/2023   S/P lumbar fusion 08/07/2023   Foreign body in right ear 06/09/2023   Impacted cerumen of left ear 06/09/2023   Fatigue 08/17/2022   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   Hx of colonic polyps 01/10/2022   Atrial fibrillation, transient (HCC) 11/02/2021   Hypotension    Chest pain 10/28/2021   Prostate cancer (HCC) 04/02/2021   Chronic HFrEF (heart failure with reduced ejection fraction) (HCC) 05/20/2020   Claudication 09/01/2019   Numbness of hand 06/07/2018   S/P cervical spinal fusion 04/20/2018   Paresthesia 01/10/2018   Neck pain 01/10/2018   LBBB (left bundle branch block) 09/07/2017   Other meniscus derangements, posterior horn of medial meniscus, left knee, insufficency fracture medial tibial plateau 04/07/2017   Closed nondisplaced fracture of lateral condyle of left tibia with routine healing 04/07/2017   Status post arthroscopy of left knee 04/07/2017   Insomnia 09/14/2015   Hyperlipidemia with target LDL less than 100 09/11/2014   Rotator cuff tendinitis 04/28/2014   Obesity (BMI 30-39.9) 12/29/2013   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   Exertional shortness of breath 06/26/2012   Polyp of colon, adenomatous 04/27/2012   Elevated PSA 10/20/2011   Hypertriglyceridemia 07/08/2011   Nonischemic cardiomyopathy (HCC) 09/04/2009    Current Outpatient Medications on File Prior to Visit  Medication Sig Dispense Refill   acetaminophen  (TYLENOL ) 500 MG tablet Take 1,000 mg by mouth every 6 (six) hours as needed for headache.     aspirin  EC 81 MG tablet Take 1 tablet (81 mg total) by mouth daily.     colchicine  0.6 MG tablet TAKE 1 TABLET TWICE DAILY 90 tablet 0   Cyanocobalamin (VITAMIN B-12 PO) Take 1,000 mcg by mouth in the morning.     doxycycline  (VIBRA -TABS) 100 MG tablet Take 1 tablet (100 mg total) by mouth 2 (two) times daily. 60 tablet 1   finasteride  (PROSCAR ) 5 MG tablet Take 5 mg by mouth at bedtime.     gabapentin  (NEURONTIN ) 300 MG capsule Take 1-2 capsules (300-600 mg total) by mouth 3 (three) times daily as needed (pain). 540 capsule 1   hydrALAZINE  (APRESOLINE ) 50 MG tablet Take 1 tablet (50 mg total) by mouth 2 (two) times daily. 270 tablet 3   hydrochlorothiazide (HYDRODIURIL) 50 MG tablet Take 50 mg by mouth daily.     MAGNESIUM  PO Take 400 mg by mouth in the morning.     Multiple Vitamin (MULTIVITAMIN WITH MINERALS) TABS tablet Take 1 tablet by mouth in the morning. One-A-Day Men Health Multivitamin     Omega-3 Fatty Acids (FISH OIL PO) Take 1,000 mg by mouth in the morning.     rosuvastatin  (CRESTOR ) 5 MG tablet TAKE 1 TABLET (5 MG TOTAL) BY MOUTH EVERY MONDAY, WEDNESDAY, AND FRIDAY. 36 tablet 2   tamsulosin  (FLOMAX ) 0.4 MG CAPS capsule Take 1 capsule (0.4 mg total) by mouth at bedtime. 90 capsule 3   traZODone  (DESYREL ) 50 MG tablet Take 0.5-1 tablets (25-50 mg total)  by mouth at bedtime as needed for sleep. 30 tablet 3   Turmeric (QC TUMERIC COMPLEX PO) Take 1 tablet by mouth in the morning.     zinc  sulfate, 50mg  elemental zinc , 220 (50 Zn) MG capsule Take 220 mg by mouth in the morning.     No current facility-administered medications on file prior to visit.   Subjective: Discussed the use of AI scribe software for clinical note transcription with the patient, who gave verbal consent to proceed.    78 Y O Male with h/o CHF, BPH, Gout, HLD, Non ischemic cardiomyopathy s/p ICD, arthritis, A fib, Prostate ca who had decompressive lumbar laminectomy, PLIF L4-L5 and instrumented posterior fixation complicated by post operative wound infection requiring wound washout on 6/23 ( OR cx growing MR staph lugdunensis) who is here for fu. Initially completed 2 weeks of PO linezolid  which was then transitioned to PO doxycycline  for 4 weeks during last clinic visit with Dr Luiz for concerns of deep wound infection.   7/14 Reports completing course of PO linezolid  and currently on PO doxycycline   without missing doses or concerns and still has few pills left. Posterior wound has healed. Denies drainage. Denies fevers, chills. Denies nausea, vomiting or diarrhea. Has fu with Dr Joshua in July 29th. He is eager to finish off his antibiotics. Recently seen by Cardiology on 7/3 for ICD/CHF and PCP on 7/11 for LUTs. No other complaints.   8/5 Completed course of PO doxycycline  on 7/19. Seen by PCP on 7/23. Back pain was almost gone until he saw Neurosurgery 7/29 and later had a lot of walking during shopping that day, was in his feet approx 6-7 hrs and he was having difficulty putting things back in his car trunk. He was having difficulty lying down due to back pain, had to take 2 tylenol  pills following day, in the afternoon as well as evening. Back pain goes around his sides. Called Dr Sheryll office but they wanted ID to see him first. He also had some sore throat around the same time back pain started and ? Related. Sore throat improved with gargle. Back pain is better from 8/10 last week to 6/10 this week. He has not been taking any analgesics since last Wednesday. Back does not hurt when still, hurts when walking or standing. He reports back surgery many years ago after which back pain gets exacerbated when standing for long duration.   Denies fever, chills, nausea, vomiting, malaise. Denies weakness, numbness,  or tingling. He is able to walk without support.   8/28 Accompanied by wife. Still has back pain 7-9/10 mostly. Back pain is non existent if he stays still or finds a comfortable position if lying down, worsened when standing or with movement. Takes 6 tylenol  and 4 gabapentin  a day. He thinks he might have one time he had fevers but no recorded fevers, chills, nausea, vomiting. He had CT done on 8/24 by neurosurgery and no intervention recommended. Next fu sept 9th. Denies any numbness, tingling or weakness. He lacks energy and becomes exhausted with minimal exertion and thinks it could be due to low hb. No other complaints.   9/22 Accompanied by wife. Taking PO doxycycline  without missed doses or concerns.  Back pain has significantly improved since starting doxycycline , decreasing to 2-4/10, with mild pain during movement. 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  Saw neurosurgery on 9/9 and reports being placed on a bone stimulator.  He reports neurosurgery is hoping to avoid another surgery and have  another follow-up in a month.   Denies fever, chills, malaise, nausea or vomiting or diarrhea  11/21 Accompanied by wife. CT L spine done on 11/7. Last seen by Neurosurgery on 11/18. Compliant with PO doxycycline  with no missed doses or concerns. Back pain seems to have come back 4/10 but not bad enough to take any analgesics. Got flu vaccine. No other concerns.   Review of Systems: all systems reviewed with pertinent positives and negatives as listed above   Past Medical History:  Diagnosis Date   Arthritis    BPH (benign prostatic hypertrophy)    CHF (congestive heart failure) (HCC)    Elevated PSA    Eye problem 03/06/2016   Dr. Etta retinal detachment left eye, no sx done   History of gout    pt 05-20-2013 states stable    History of melanoma excision    2011-- LEFT UPPER BACK   Hyperlipemia    Hypertriglyceridemia    ICD (implantable cardioverter-defibrillator) battery depletion  10/27/2021   Left bundle branch block 08/2017   Concern for LBBB mediated cardiomyopathy   Melanoma (HCC) 2011   back   Nonischemic cardiomyopathy (HCC) 09/2020   08/2009: EF 40-45%, Global HK-> 09/2017 -> EF 45-50% ; 3/'22: EF<20%.  Elevated LVEDP.;  09/2020: EF 20-25%.  GR 1 DD   Numbness    hands   Personal history of arthritis    Personal history of other diseases of circulatory system    Pre-diabetes    Sickle cell anemia (HCC)    pt denies   Past Surgical History:  Procedure Laterality Date   ANTERIOR CERVICAL DECOMP/DISCECTOMY FUSION N/A 04/20/2018   Procedure: Anterior Cervical Decompression Fusion - Cervical Three-Cervical Four - Cervical Four-Cervical Five - Cervical Five-Cervical Six;  Surgeon: Joshua Alm RAMAN, MD;  Location: [facility] OR;  Service: Neurosurgery;  Laterality: N/A;  Anterior Cervical Decompression Fusion - Cervical Three-Cervical Four - Cervical Four-Cervical Five - Cervical Five-Cervical Six   APPENDECTOMY  AS CHILD   BACK SURGERY     BIV ICD INSERTION CRT-D N/A 10/27/2021   Procedure: BIV ICD INSERTION CRT-D;  Surgeon: Waddell Danelle ORN, MD;  Location: [facility] INVASIVE CV LAB;  Service: Cardiovascular;  Laterality: N/A;   CARDIOVASCULAR STRESS TEST  10/05/1998   MILD GLOBAL HYPOKINESIS AND ISCHEMIAIN ANTEROSEPTAL  AT [facility]/ EF 42%   CARPAL TUNNEL RELEASE Bilateral    CENTRAL LINE INSERTION  10/28/2021   Procedure: CENTRAL LINE INSERTION;  Surgeon: Inocencio Soyla Lunger, MD;  Location: [facility] INVASIVE CV LAB;  Service: Cardiovascular;;   CENTRAL LINE INSERTION  10/28/2021   Procedure: CENTRAL LINE INSERTION;  Surgeon: Wonda Sharper, MD;  Location: [facility] INVASIVE CV LAB;  Service: Cardiovascular;;   COLONOSCOPY WITH PROPOFOL  N/A 03/14/2022   Procedure: COLONOSCOPY WITH PROPOFOL ;  Surgeon: Shila Gustav GAILS, MD;  Location: [facility] ENDOSCOPY;  Service: Gastroenterology;  Laterality: N/A;   EXCISION MELANOMA LEFT UPPER BACK  10/14/2009   eye lid surgery Bilateral 2018   GOLD SEED  IMPLANT N/A 07/23/2021   Procedure: GOLD SEED IMPLANT;  Surgeon: Cam Morene ORN, MD;  Location: [facility] ORS;  Service: Urology;  Laterality: N/A;   KNEE ARTHROSCOPY WITH SUBCHONDROPLASTY Left 04/07/2017   Procedure: LEFT KNEE ARTHROSCOPY WITH PARTIAL MEDIAL MENISCECTOMY AND MEDIAL TIBIAL SUBCHONDROPLASTY;  Surgeon: Vernetta Lonni GRADE, MD;  Location: [facility] ORS;  Service: Orthopedics;  Laterality: Left;   knee injections Bilateral    LEAD REVISION/REPAIR N/A 10/28/2021   Procedure: LEAD REVISION/REPAIR;  Surgeon: Inocencio Soyla Lunger, MD;  Location: [facility]  INVASIVE CV LAB;  Service: Cardiovascular;  Laterality: N/A;   LEFT HEART CATH AND CORONARY ANGIOGRAPHY  04/2002   minimal irregularities in the LAD/  EF 50%  -   DR AL LITTLE   LUMBAR DISC SURGERY  09/12/2000   left  L5 -- S1   LUMBAR WOUND DEBRIDEMENT N/A 08/29/2023   Procedure: LUMBAR WOUND IRRIGATION AND DEBRIDEMENT;  Surgeon: Lanis Pupa, MD;  Location: [facility] OR;  Service: Neurosurgery;  Laterality: N/A;  I & D Lumbar wound   PERICARDIOCENTESIS N/A 10/28/2021   Procedure: PERICARDIOCENTESIS;  Surgeon: Inocencio Soyla Lunger, MD;  Location: [facility] INVASIVE CV LAB;  Service: Cardiovascular;  Laterality: N/A;   PERICARDIOCENTESIS N/A 10/28/2021   Procedure: PERICARDIOCENTESIS;  Surgeon: Wonda Sharper, MD;  Location: [facility] INVASIVE CV LAB;  Service: Cardiovascular;  Laterality: N/A;   PROSTATE BIOPSY N/A 05/22/2013   Procedure: BIOPSY TRANSRECTAL ULTRASONIC PROSTATE (TUBP);  Surgeon: Alm GORMAN Fragmin, MD;  Location: [facility];  Service: Urology;  Laterality: N/A;   RIGHT/LEFT HEART CATH AND CORONARY ANGIOGRAPHY N/A 06/25/2020   Procedure: RIGHT/LEFT HEART CATH AND CORONARY ANGIOGRAPHY;  Surgeon: Claudene Victory ORN, MD;  Location: [facility] INVASIVE CV LAB;:; Normal coronary arteries; LVEDP 23 mmHg; PCWP 19 mmHg.   ROTATOR CUFF REPAIR Left 2016   SPACE OAR INSTILLATION N/A 07/23/2021   Procedure: SPACE OAR INSTILLATION;  Surgeon: Cam Morene ORN, MD;  Location: [facility] ORS;  Service: Urology;  Laterality: N/A;   THROAT SURGERY  04/20/2018   TRANSRECTAL ULTRASOUND PROSTATE BX  05-23-2005  &  04-19-2001   TRANSTHORACIC ECHOCARDIOGRAM  09/2017   EF 45-50 % (previously reported as 40 to 45%).  Incoordinate septal motion with mild LVH.  GR 1 DD.  Aortic sclerosis but no stenosis.   TRANSTHORACIC ECHOCARDIOGRAM  10/06/2020   Severely reduced EF of 20 to 25%.  No LV thrombus.  Severe septal-lateral wall systolic dyssynchrony due to LBBB.  Global HK.  Moderately dilated LV.  GR 1 DD with mild LA dilation..  Unable to assess PAP with normal RV size and function.  Normal RAP.  Mild AOV sclerosis but no stenosis.  Mild to moderate MR.   TRANSTHORACIC ECHOCARDIOGRAM  06/16/2020   Severely reduced EF <20%.  Moderate severely dilated LV.  GR 2 DD.  Elevated LVEDP.  Mild LA dilation.  Mildly reduced RV function.  Mild MR.  Mild aortic valve calcification    Social History   Tobacco Use   Smoking status: Former    Current packs/day: 0.00    Average packs/day: 2.0 packs/day for 20.0 years (40.0 ttl pk-yrs)    Types: Cigarettes    Start date: 03/28/1960    Quit date: 03/28/1980    Years since quitting: 43.9   Smokeless tobacco: Never  Vaping Use   Vaping status: Never Used  Substance Use Topics   Alcohol  use: No    Alcohol /week: 0.0 standard drinks of alcohol    Drug use: No    Family History  Problem Relation Age of Onset   Arthritis Mother    COPD Father    Lung cancer Father    Cancer Sister        type unknown   Breast cancer Sister    Lung cancer Sister    Cardiomyopathy Other        idiopathic. trivial disease 2004 cath, 2010 cardiac CT - no sig. dz, nl LV fxn. cards: Little   Colon cancer Neg Hx     Allergies  Allergen Reactions  Nitrostat [Nitroglycerin] Anaphylaxis    Cardiologist suggested he shouldn't take this med because it could kill him with his heart condition. Lowers BP   Triclosan     Other Reaction(s):  severe dermatitis   Vancomycin  Hives    08/29/23 per confirmation with RN, shortly after starting vancomycin  infusion patient developed itching and a rash on his torso   Ace Inhibitors Swelling   Mevacor  [Lovastatin ] Nausea Only   Solarcaine [Benzocaine] Dermatitis    Health Maintenance  Topic Date Due   Zoster Vaccines- Shingrix (2 of 2) 07/28/2021   COVID-19 Vaccine (6 - 2025-26 season) 11/27/2023   Medicare Annual Wellness (AWV)  12/12/2024   Colonoscopy  03/15/2027   DTaP/Tdap/Td (3 - Td or Tdap) 06/05/2031   Pneumococcal Vaccine: 50+ Years  Completed   Influenza Vaccine  Completed   Hepatitis C Screening  Completed   Meningococcal B Vaccine  Aged Out   Hepatitis B Vaccines 19-59 Average Risk  Discontinued    Objective: BP 101/63   Pulse (!) 118   Temp 98.1 F (36.7 C) (Oral)   Wt 195 lb (88.5 kg)   SpO2 96%   BMI 29.65 kg/m   Physical Exam Constitutional:      Appearance: Normal appearance.  HENT:     Head: Normocephalic and atraumatic.      Mouth: Mucous membranes are moist.  Eyes:    Conjunctiva/sclera: Conjunctivae normal.     Pupils: Pupils are equal, round, and b/l symmetrical    Cardiovascular:     Rate and Rhythm: Normal rate    Heart sounds:  Pulmonary:     Effort: Pulmonary effort is normal.     Breath sounds:  Abdominal:     General: Non distended     Palpations:  Musculoskeletal:        General: Normal range of motion. Ambulatory  Skin:    General: Skin is warm and dry. ICD site OK     Comments: posterior lumbar wound has healed  Neurological:     General: grossly non focal     Mental Status: awake, alert and oriented to person, place, and time.   Psychiatric:        Mood and Affect: Mood normal.   Lab Results Lab Results  Component Value Date   WBC 7.0 01/15/2024   HGB 10.3 (L) 01/15/2024   HCT 32.7 (L) 01/15/2024   MCV 77.9 (L) 01/15/2024   PLT 128.0 (L) 01/15/2024    Lab Results  Component Value Date   CREATININE 1.16  10/18/2023   BUN 29 (H) 10/18/2023   NA 138 10/18/2023   K 4.5 10/18/2023   CL 105 10/18/2023   CO2 26 10/18/2023    Lab Results  Component Value Date   ALT 14 10/18/2023   AST 15 10/18/2023   ALKPHOS 78 10/18/2023   BILITOT 0.3 10/18/2023    Lab Results  Component Value Date   CHOL 121 10/18/2023   HDL 46.80 10/18/2023   LDLCALC 58 10/18/2023   TRIG 83.0 10/18/2023   CHOLHDL 3 10/18/2023   No results found for: LABRPR, RPRTITER No results found for: HIV1RNAQUANT, HIV1RNAVL, CD4TABS   Microbiology Results for orders placed or performed in visit on 10/06/23  Urine Culture     Status: None   Collection Time: 10/06/23  2:39 PM   Specimen: Urine  Result Value Ref Range Status   MICRO NUMBER: [id number]  Final   SPECIMEN QUALITY: Adequate  Final   Sample Source URINE  Final   STATUS: FINAL  Final   Result: No Growth  Final   Imaging 02/02/24 CT L spine  IMPRESSION: 1. Diffuse disc bulging and bilateral facet hypertrophy at L3-4 causing moderate central spinal canal stenosis and moderate bilateral lateral recess stenosis with possible impingement of the L4 nerves in the lateral recesses. 2. Status post interbody fusion and bilateral posterolateral spinal fixation at L4-5 with reactive endplate sclerosis and persistent lucency about the right transpedicular screw at L4. 3. Slight degenerative anterolisthesis at L4-5 and slight degenerative retrolisthesis at L5-S1, similar to prior. 4. 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  Assessment/Pla # Post op lumbar wound infection  - 6/3 s/p washout, OR cx staph lugdunensis. Per OR note  some thin yellow fluid encountered/no obvious fascial dehiscence. - Initially thought to be superficial and planned for 2 weeks course of PO linezolid  but extended course of antibiotics on 6/18 to  4 more weeks of PO doxycycline   - Reports doxycycline  completed on 7/19.  - CT 8/13 Lucency along the right greater than left L4 screws  of loosening. Infection not excluded. - 11/24/23 started on PO doxycycline  100mg  po bid due to worsening back pain and elevated inflammatory markers followed by improvement in inflammatory markers.  - Adherence assessed, side effects reviewed/discussed and DDIs reviewed   Plan - Discussed with pt about the potential side effects of Doxycycline  below/DDI 1. doxycycline  can cause photosensitivity/sunburn and to wear sunscreen when outdoors 2. Pt to take MVI, Ca, Fe, Mg, Zn at least 2 hours before taking doxycycline  to prevent decreased absorption of doxycycline  3. To take with full glass of water  and remain upright for at least 30 mins after administration  - BMP, ESR and CRP  - continue PO doxycycline  100mg  po bid, refills sent  - fu in 3 months  # Back pain # Lumbar spinal stenosis with possible impingement of L4 nerve - does not need to use analgesics   # Immunization counseling  - declined flu vaccine  I spent 30 minutes involved in face-to-face and non-face-to-face activities for this patient on the day of the visit. Professional time spent includes the following activities: Preparing to see the patient (review of tests), Reviewing notes 11/3 and Neurosurgery note 11/18,  Performing a medically appropriate examination and evaluation, Ordering labs/medication, Documenting clinical information in the EMR, Independently interpreting results (not separately reported), Counseling and educating the patient/wife  and Care coordination (not separately reported).   Of note, portions of this note may have been created with voice recognition software. While this note has been edited for accuracy, occasional wrong-word or 'sound-a-like' substitutions may have occurred due to the inherent limitations of voice recognition software.   Annalee Joseph, MD [facility] 02/16/2024, 10:46 AM

## 2024-02-17 ENCOUNTER — Ambulatory Visit: Payer: Self-pay | Admitting: Infectious Diseases

## 2024-02-17 LAB — BASIC METABOLIC PANEL WITH GFR
BUN: 23 mg/dL (ref 7–25)
CO2: 25 mmol/L (ref 20–32)
Calcium: 9.3 mg/dL (ref 8.6–10.3)
Chloride: 106 mmol/L (ref 98–110)
Creat: 1.14 mg/dL (ref 0.70–1.28)
Glucose, Bld: 105 mg/dL — ABNORMAL HIGH (ref 65–99)
Potassium: 4.3 mmol/L (ref 3.5–5.3)
Sodium: 139 mmol/L (ref 135–146)
eGFR: 65 mL/min/1.73m2 (ref >=60)

## 2024-02-17 LAB — C-REACTIVE PROTEIN: CRP: <3 mg/L (ref <8.0)

## 2024-02-17 LAB — SEDIMENTATION RATE: Sed Rate: 11 mm/h (ref 0–20)

## 2024-02-21 ENCOUNTER — Telehealth: Payer: Self-pay

## 2024-02-21 NOTE — Telephone Encounter (Signed)
 CT was ordered by Raguel Dies, NP. I do not have a call back number from Fran. If she calls back please tell her to reach out to [place] neuro as they are the ones that placed the CT   Copied from CRM #[id number]. Topic: Clinical - Medication Prior Auth >> Feb 21, 2024 10:59 AM Viola FALCON wrote: Reason for CRM: Sherrell from [facility] called - Patient needs back dated prior authorization for the CT SCAN done 01/30/24. Please call patient with updates.

## 2024-02-29 ENCOUNTER — Ambulatory Visit (INDEPENDENT_AMBULATORY_CARE_PROVIDER_SITE_OTHER): Admitting: Family Medicine

## 2024-02-29 ENCOUNTER — Encounter: Payer: Self-pay | Admitting: Family Medicine

## 2024-02-29 VITALS — BP 104/60 | HR 85 | Temp 98.1°F | Resp 12 | Ht 68.0 in | Wt 191.4 lb

## 2024-02-29 DIAGNOSIS — R5383 Other fatigue: Secondary | ICD-10-CM

## 2024-02-29 DIAGNOSIS — I5022 Chronic systolic (congestive) heart failure: Secondary | ICD-10-CM | POA: Diagnosis not present

## 2024-02-29 DIAGNOSIS — D509 Iron deficiency anemia, unspecified: Secondary | ICD-10-CM | POA: Diagnosis not present

## 2024-02-29 DIAGNOSIS — R0609 Other forms of dyspnea: Secondary | ICD-10-CM | POA: Diagnosis not present

## 2024-02-29 LAB — CBC
HCT: 36.8 % — ABNORMAL LOW (ref 39.0–52.0)
Hemoglobin: 11.8 g/dL — ABNORMAL LOW (ref 13.0–17.0)
MCHC: 31.9 g/dL (ref 30.0–36.0)
MCV: 77.6 fl — ABNORMAL LOW (ref 78.0–100.0)
Platelets: 125 K/uL — ABNORMAL LOW (ref 150.0–400.0)
RBC: 4.75 Mil/uL (ref 4.22–5.81)
RDW: 20.6 % — ABNORMAL HIGH (ref 11.5–15.5)
WBC: 7.1 K/uL (ref 4.0–10.5)

## 2024-02-29 LAB — BRAIN NATRIURETIC PEPTIDE: Pro B Natriuretic peptide (BNP): 136 pg/mL — ABNORMAL HIGH (ref 0.0–100.0)

## 2024-02-29 NOTE — Progress Notes (Signed)
 Subjective:  Patient ID: [name] [name], male    DOB: 01-Aug-1945  Age: 78 y.o. MRN: [id number]  CC:  Chief Complaint  Patient presents with   Fatigue    Does not have any energy still. Feels worse than before. Patient added a iron supplement. He is also taking Calcium  but is unsure of the strength.     HPI [name] [name] presents for   Fatigue/anemia Follow-up from November 3 visit.  Fatigue noted over the past year.  Had been more active for the previous months or so, hemoglobin had decreased to where he had been 13.4 prior to surgery on May 2, then 11.6 on June 3, 9.7 on June 4, and has ranged from 8.7-10.8 since that time.  Most recent hemoglobin 10.3 on October 20.  He had been hospitalized for postoperative wound infection, on prolonged course of doxycycline  for treatment, infectious disease eval November 21, 70-month follow-up planned.  Also history of chronic systolic heart failure with low EF of 20 to 25% on his echo in April, stable.  Followed by cardiology.  Was noted to have iron deficiency on labs in October with 8 percent sat, ferritin of 12.  Iron supplementation recommended.  Colonoscopy in December 2023 with nonbleeding external and internal hemorrhoids, moderate diverticulosis.  He was winded with activity, but no chest pains, and decreased energy with activity when discussed November 3.  Also reports some difficulty with sleep, chronic insomnia, to Benadryl  at night for the past year with less effectiveness recently.  Chronic nocturia without changes, treated with tamsulosin  and finasteride .  Additional option would be surgical per urology.  Also on 1 gabapentin  at night, previously 2, for neck pain.  Fatigue was thought to be possibly multifactorial with sleep onset, chronic neck and back pain, nocturia, and anemia.  For frequent wakening, option of higher dosing of gabapentin  to see if his neck pain was contributing.  If ineffective, option to try trazodone  in place of  Benadryl , printed prescription given.  Recommended iron once per day with repeat labs today.  Additionally recommended he discuss his fatigue and dyspnea with his cardiologist as possible CHF contribution to his fatigue.  Feels ok when sitting. Notes more fatigue, and out of breath with minimal activity. Feels like worse than when had echo. Feels worse past 6 months. About the same since last visit.  No cough. Slight soreness in chest last night at rest, no CP with activity.   No new arm/leg swelling. Some swelling in sockline at times.   Has taken iron - once per day. No melena/hematochezia.  Sleeping better with 2 gabapentin  at night, still on benadryl .     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   Coagulase-negative staphylococcal infection 12/18/2023   Back pain 10/31/2023   Postoperative infection 10/13/2023   Medication management 10/13/2023   Lower urinary tract symptoms (LUTS) 10/06/2023   Surgical site  infection 09/19/2023   Unintentional weight loss 09/19/2023   Myelosuppression 09/19/2023   Postoperative wound breakdown 08/29/2023   S/P lumbar fusion 08/07/2023   Foreign body in right ear 06/09/2023   Impacted cerumen of left ear 06/09/2023   Fatigue 08/17/2022   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   Hx of colonic polyps 01/10/2022   Atrial fibrillation, transient (HCC) 11/02/2021   Hypotension     Chest pain 10/28/2021   Prostate cancer (HCC) 04/02/2021   Chronic HFrEF (heart failure with reduced ejection fraction) (HCC) 05/20/2020   Claudication 09/01/2019   Numbness of hand 06/07/2018   S/P cervical spinal fusion 04/20/2018   Paresthesia 01/10/2018   Neck pain 01/10/2018   LBBB (left bundle branch block) 09/07/2017   Other meniscus derangements, posterior horn of medial meniscus, left knee, insufficency fracture medial tibial plateau 04/07/2017   Closed nondisplaced fracture of lateral condyle of left tibia with routine healing 04/07/2017   Status post arthroscopy of left knee 04/07/2017   Insomnia 09/14/2015   Hyperlipidemia with target LDL less than 100 09/11/2014   Rotator cuff tendinitis 04/28/2014   Obesity (BMI 30-39.9) 12/29/2013   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   Exertional shortness of breath 06/26/2012   Polyp of colon, adenomatous 04/27/2012   Elevated PSA 10/20/2011   Hypertriglyceridemia 07/08/2011   Nonischemic cardiomyopathy (HCC) 09/04/2009   Past Medical History:  Diagnosis Date   Arthritis    BPH (benign prostatic hypertrophy)    CHF (congestive heart failure) (HCC)    Elevated PSA    Eye problem 03/06/2016   Dr. 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  Elevated LVEDP.;  09/2020: EF 20-25%.  GR 1 DD   Numbness    hands   Personal history of arthritis    Personal history of other diseases of circulatory system    Pre-diabetes    Sickle cell anemia (HCC)    pt denies   Past Surgical  History:  Procedure Laterality Date   ANTERIOR CERVICAL DECOMP/DISCECTOMY FUSION N/A 04/20/2018   Procedure: Anterior Cervical Decompression Fusion - Cervical Three-Cervical Four - Cervical Four-Cervical Five - Cervical Five-Cervical Six;  Surgeon: Joshua Alm RAMAN, MD;  Location: [facility] OR;  Service: Neurosurgery;  Laterality: N/A;  Anterior Cervical Decompression Fusion - Cervical Three-Cervical Four - Cervical Four-Cervical Five - Cervical Five-Cervical Six   APPENDECTOMY  AS CHILD   BACK SURGERY     BIV ICD INSERTION CRT-D N/A 10/27/2021   Procedure: BIV ICD INSERTION CRT-D;  Surgeon: Waddell Danelle ORN, MD;  Location: [facility] INVASIVE CV LAB;  Service: Cardiovascular;  Laterality: N/A;   CARDIOVASCULAR STRESS TEST  10/05/1998   MILD GLOBAL HYPOKINESIS AND ISCHEMIAIN ANTEROSEPTAL  AT [facility]/ EF 42%   CARPAL TUNNEL RELEASE Bilateral    CENTRAL LINE INSERTION  10/28/2021   Procedure: CENTRAL LINE INSERTION;  Surgeon: Inocencio Soyla Lunger, MD;  Location: [facility] INVASIVE CV LAB;  Service: Cardiovascular;;   CENTRAL LINE INSERTION  10/28/2021   Procedure: CENTRAL LINE INSERTION;  Surgeon:  Wonda Sharper, MD;  Location: [facility] INVASIVE CV LAB;  Service: Cardiovascular;;   COLONOSCOPY WITH PROPOFOL  N/A 03/14/2022   Procedure: COLONOSCOPY WITH PROPOFOL ;  Surgeon: Shila Gustav GAILS, MD;  Location: [facility] ENDOSCOPY;  Service: Gastroenterology;  Laterality: N/A;   EXCISION MELANOMA LEFT UPPER BACK  10/14/2009   eye lid surgery Bilateral 2018   GOLD SEED IMPLANT N/A 07/23/2021   Procedure: GOLD SEED IMPLANT;  Surgeon: Cam Morene ORN, MD;  Location: [facility] ORS;  Service: Urology;  Laterality: N/A;   KNEE ARTHROSCOPY WITH SUBCHONDROPLASTY Left 04/07/2017   Procedure: LEFT KNEE ARTHROSCOPY WITH PARTIAL MEDIAL MENISCECTOMY AND MEDIAL TIBIAL SUBCHONDROPLASTY;  Surgeon: Vernetta Lonni GRADE, MD;  Location: [facility] ORS;  Service: Orthopedics;  Laterality: Left;   knee injections Bilateral    LEAD REVISION/REPAIR N/A 10/28/2021    Procedure: LEAD REVISION/REPAIR;  Surgeon: Inocencio Soyla Lunger, MD;  Location: [facility] INVASIVE CV LAB;  Service: Cardiovascular;  Laterality: N/A;   LEFT HEART CATH AND CORONARY ANGIOGRAPHY  04/2002   minimal irregularities in the LAD/  EF 50%  -   DR AL LITTLE   LUMBAR DISC SURGERY  09/12/2000   left  L5 -- S1   LUMBAR WOUND DEBRIDEMENT N/A 08/29/2023   Procedure: LUMBAR WOUND IRRIGATION AND DEBRIDEMENT;  Surgeon: Lanis Pupa, MD;  Location: [facility] OR;  Service: Neurosurgery;  Laterality: N/A;  I & D Lumbar wound   PERICARDIOCENTESIS N/A 10/28/2021   Procedure: PERICARDIOCENTESIS;  Surgeon: Inocencio Soyla Lunger, MD;  Location: [facility] INVASIVE CV LAB;  Service: Cardiovascular;  Laterality: N/A;   PERICARDIOCENTESIS N/A 10/28/2021   Procedure: PERICARDIOCENTESIS;  Surgeon: Wonda Sharper, MD;  Location: [facility] INVASIVE CV LAB;  Service: Cardiovascular;  Laterality: N/A;   PROSTATE BIOPSY N/A 05/22/2013   Procedure: BIOPSY TRANSRECTAL ULTRASONIC PROSTATE (TUBP);  Surgeon: Alm GORMAN Fragmin, MD;  Location: [facility];  Service: Urology;  Laterality: N/A;   RIGHT/LEFT HEART CATH AND CORONARY ANGIOGRAPHY N/A 06/25/2020   Procedure: RIGHT/LEFT HEART CATH AND CORONARY ANGIOGRAPHY;  Surgeon: Claudene Victory ORN, MD;  Location: [facility] INVASIVE CV LAB;:; Normal coronary arteries; LVEDP 23 mmHg; PCWP 19 mmHg.   ROTATOR CUFF REPAIR Left 2016   SPACE OAR INSTILLATION N/A 07/23/2021   Procedure: SPACE OAR INSTILLATION;  Surgeon: Cam Morene ORN, MD;  Location: [facility] ORS;  Service: Urology;  Laterality: N/A;   THROAT SURGERY  04/20/2018   TRANSRECTAL ULTRASOUND PROSTATE BX  05-23-2005  &  04-19-2001   TRANSTHORACIC ECHOCARDIOGRAM  09/2017   EF 45-50 % (previously reported as 40 to 45%).  Incoordinate septal motion with mild LVH.  GR 1 DD.  Aortic sclerosis but no stenosis.   TRANSTHORACIC ECHOCARDIOGRAM  10/06/2020   Severely reduced EF of 20 to 25%.  No LV thrombus.  Severe septal-lateral wall systolic  dyssynchrony due to LBBB.  Global HK.  Moderately dilated LV.  GR 1 DD with mild LA dilation..  Unable to assess PAP with normal RV size and function.  Normal RAP.  Mild AOV sclerosis but no stenosis.  Mild to moderate MR.   TRANSTHORACIC ECHOCARDIOGRAM  06/16/2020   Severely reduced EF <20%.  Moderate severely dilated LV.  GR 2 DD.  Elevated LVEDP.  Mild LA dilation.  Mildly reduced RV function.  Mild MR.  Mild aortic valve calcification   Allergies  Allergen Reactions   Nitrostat [Nitroglycerin] Anaphylaxis    Cardiologist suggested he shouldn't take this med because it could kill him with his heart condition. Lowers BP   Triclosan  Other Reaction(s): severe dermatitis   Vancomycin  Hives    08/29/23 per confirmation with RN, shortly after starting vancomycin  infusion patient developed itching and a rash on his torso   Ace Inhibitors Swelling   Mevacor  [Lovastatin ] Nausea Only   Solarcaine [Benzocaine] Dermatitis   Prior to Admission medications   Medication Sig Start Date End Date Taking? Authorizing Provider  acetaminophen  (TYLENOL ) 500 MG tablet Take 1,000 mg by mouth every 6 (six) hours as needed for headache.   Yes [provider]  aspirin  EC 81 MG tablet Take 1 tablet (81 mg total) by mouth daily. 04/26/18  Yes Costella, Vincent J, PA-C  colchicine  0.6 MG tablet TAKE 1 TABLET TWICE DAILY 08/25/23  Yes Levora [name] SAUNDERS, MD  Cyanocobalamin (VITAMIN B-12 PO) Take 1,000 mcg by mouth in the morning.   Yes [provider]  doxycycline  (VIBRA -TABS) 100 MG tablet Take 1 tablet (100 mg total) by mouth 2 (two) times daily. 02/16/24  Yes Dea Shiner, MD  Ferrous Sulfate (IRON) 325 (65 Fe) MG TABS Take 1 tablet by mouth daily.   Yes [provider]  finasteride  (PROSCAR ) 5 MG tablet Take 5 mg by mouth at bedtime.   Yes [provider]  gabapentin  (NEURONTIN ) 300 MG capsule Take 1-2 capsules (300-600 mg total) by mouth 3 (three) times daily as needed  (pain). 10/18/23  Yes Levora [name] SAUNDERS, MD  hydrALAZINE  (APRESOLINE ) 50 MG tablet Take 1 tablet (50 mg total) by mouth 2 (two) times daily. 11/03/23 02/29/24 Yes Anner Alm ORN, MD  hydrochlorothiazide (HYDRODIURIL) 50 MG tablet Take 50 mg by mouth daily.   Yes [provider]  MAGNESIUM  PO Take 400 mg by mouth in the morning.   Yes [provider]  Multiple Vitamin (MULTIVITAMIN WITH MINERALS) TABS tablet Take 1 tablet by mouth in the morning. One-A-Day Men Health Multivitamin   Yes [provider]  Omega-3 Fatty Acids (FISH OIL PO) Take 1,000 mg by mouth in the morning.   Yes [provider]  rosuvastatin  (CRESTOR ) 5 MG tablet TAKE 1 TABLET (5 MG TOTAL) BY MOUTH EVERY MONDAY, WEDNESDAY, AND FRIDAY. 11/29/23 02/29/24 Yes West, Katlyn D, NP  tamsulosin  (FLOMAX ) 0.4 MG CAPS capsule Take 1 capsule (0.4 mg total) by mouth at bedtime. 10/18/23  Yes Levora [name] SAUNDERS, MD  traZODone  (DESYREL ) 50 MG tablet Take 0.5-1 tablets (25-50 mg total) by mouth at bedtime as needed for sleep. 01/29/24  Yes Levora [name] SAUNDERS, MD  Turmeric (QC TUMERIC COMPLEX PO) Take 1 tablet by mouth in the morning.   Yes [provider]  zinc  sulfate, 50mg  elemental zinc , 220 (50 Zn) MG capsule Take 220 mg by mouth in the morning.   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  Tobacco Use   Smoking status: Former    Current packs/day: 0.00    Average packs/day: 2.0 packs/day for 20.0 years (40.0 ttl pk-yrs)    Types: Cigarettes    Start date: 03/28/1960    Quit date: 03/28/1980    Years since quitting: 43.9   Smokeless tobacco: Never  Vaping Use   Vaping status: Never Used  Substance and Sexual Activity    Alcohol   use: No    Alcohol /week: 0.0 standard drinks of alcohol    Drug use: No   Sexual activity: Not Currently  Other Topics Concern   Not on file  Social History Narrative   He walks on a relatively regular basis about 15 minutes at a time mostly to the discomfort. He previously had walked up a 30-40 minutes a time without problems. Education: [employer].   Lives alone./2025   Right-handed.   One cup coffee daily.   Social Drivers of Corporate Investment Banker Strain: Low Risk  (12/13/2023)   Overall Financial Resource Strain (CARDIA)    Difficulty of Paying Living Expenses: Not very hard  Food Insecurity: No Food Insecurity (12/13/2023)   Hunger Vital Sign    Worried About Running Out of Food in the Last Year: Never true    Ran Out of Food in the Last Year: Never true  Transportation Needs: No Transportation Needs (12/13/2023)   PRAPARE - Administrator, Civil Service (Medical): No    Lack of Transportation (Non-Medical): No  Physical Activity: Sufficiently Active (12/13/2023)   Exercise Vital Sign    Days of Exercise per Week: 7 days    Minutes of Exercise per Session: 30 min  Stress: Stress Concern Present (12/13/2023)   [employer] of Occupational Health - Occupational Stress Questionnaire    Feeling of Stress: To some extent  Social Connections: Moderately Integrated (12/13/2023)   Social Connection and Isolation Panel    Frequency of Communication with Friends and Family: More than three times a week    Frequency of Social Gatherings with Friends and Family: Three times a week    Attends Religious Services: More than 4 times per year    Active Member of Clubs or Organizations: Yes    Attends Banker Meetings: More than 4 times per year    Marital Status: Widowed  Intimate Partner Violence: Patient Unable To Answer (12/13/2023)   Humiliation, Afraid, Rape, and Kick questionnaire    Fear of Current or Ex-Partner: Patient unable to answer     Emotionally Abused: Patient unable to answer    Physically Abused: Patient unable to answer    Sexually Abused: Patient unable to answer    Review of Systems   Objective:   Vitals:   02/29/24 1036  BP: 104/60  Pulse: 85  Resp: 12  Temp: 98.1 F (36.7 C)  TempSrc: Temporal  SpO2: 97%  Weight: 191 lb 6.4 oz (86.8 kg)  Height: 5' 8 (1.727 m)     Physical Exam Vitals reviewed.  Constitutional:      General: He is not in acute distress.    Appearance: Normal appearance. He is well-developed. He is not ill-appearing or diaphoretic.  HENT:     Head: Normocephalic and atraumatic.  Neck:     Vascular: No carotid bruit or JVD.  Cardiovascular:     Rate and Rhythm: Normal rate and regular rhythm.     Heart sounds: Normal heart sounds. No murmur heard. Pulmonary:     Effort: Pulmonary effort is normal.     Breath sounds: Normal breath sounds. No rales.  Musculoskeletal:     Right lower leg: No edema.     Left lower leg: No edema.  Skin:    General: Skin is warm and dry.  Neurological:     Mental Status: He is alert and oriented to person, place, and time.  Psychiatric:        Mood and  Affect: Mood normal.        Assessment & Plan:  [name] [name] is a 78 y.o. male . Fatigue, unspecified type - Plan: B Nat Peptide  DOE (dyspnea on exertion) - Plan: B Nat Peptide  Chronic HFrEF (heart failure with reduced ejection fraction) (HCC)  Iron deficiency anemia, unspecified iron deficiency anemia type - Plan: CBC, Iron, TIBC and Ferritin Panel  Although fatigue may be multifactorial, his dyspnea with exertion, minimal exertion is likely due to accommodation of his chronic systolic heart failure and iron deficiency anemia.  Will check a BNP, updated iron and CBC.  Stay on iron once per day for now.  Will coordinate care with his cardiologist to determine if updated echo or further testing needed given change in symptoms since last echo in April.  RTC precautions.  Next  step to be determined by labs above.  No orders of the defined types were placed in this encounter.  Patient Instructions  Glad to hear you are sleeping better.  As we discussed your fatigue could be due to a number of different causes including the continued wound infection treatment, anemia, but also your heart failure.  With the shortness of breath with activity, I would be concerned more with the heart failure and anemia side of things.  I am checking a heart failure test today and rechecking the blood count, iron tests.  I will coordinate care with your cardiologist.  Stay on iron once per day for now. Return to the clinic or go to the nearest emergency room if any of your symptoms worsen or new symptoms occur.     Signed,   [name] Pines, MD [facility], [facility] 02/29/24 11:24 AM

## 2024-02-29 NOTE — Patient Instructions (Signed)
 Glad to hear you are sleeping better.  As we discussed your fatigue could be due to a number of different causes including the continued wound infection treatment, anemia, but also your heart failure.  With the shortness of breath with activity, I would be concerned more with the heart failure and anemia side of things.  I am checking a heart failure test today and rechecking the blood count, iron tests.  I will coordinate care with your cardiologist.  Stay on iron once per day for now. Return to the clinic or go to the nearest emergency room if any of your symptoms worsen or new symptoms occur.

## 2024-03-01 LAB — IRON,TIBC AND FERRITIN PANEL
%SAT: 12 % — ABNORMAL LOW (ref 20–48)
Ferritin: 16 ng/mL — ABNORMAL LOW (ref 24–380)
Iron: 52 ug/dL (ref 50–180)
TIBC: 437 ug/dL — ABNORMAL HIGH (ref 250–425)

## 2024-03-03 ENCOUNTER — Encounter: Payer: Self-pay | Admitting: Family Medicine

## 2024-03-04 NOTE — Telephone Encounter (Signed)
 Pt reports taking Calcium  600 mg PO daily

## 2024-03-05 ENCOUNTER — Ambulatory Visit: Payer: Self-pay | Admitting: Family Medicine

## 2024-03-11 NOTE — Progress Notes (Signed)
 Lab results have been discussed.   Verbalized understanding? Yes  Are there any questions? Patient has a bill that came to him. He spoke to someone and they said they would talk to you? Do you know anything about this?

## 2024-03-11 NOTE — Progress Notes (Signed)
 It was the CT scan, the insurance company needs to know why it was needed

## 2024-03-11 NOTE — Telephone Encounter (Signed)
 I asked patient which service was not covered. He said that he is not sure but the insurance needs Dr. Valorie approval to cover his services.

## 2024-03-13 NOTE — Telephone Encounter (Signed)
 Please see orders, I did not order CT recently.  It appears that has been ordered by his neurosurgeon at [facility].  Should discuss with their office regarding coverage for that study.  Thanks

## 2024-03-19 NOTE — Progress Notes (Signed)
Left detailed VM per DPR

## 2024-03-26 ENCOUNTER — Telehealth: Payer: Self-pay | Admitting: *Deleted

## 2024-03-26 NOTE — Telephone Encounter (Signed)
" °  BNP is nominally elevated but probably would not explain significant dyspnea.  We can get him in to be seen by either me and APP, and check an echo plus/minus ischemic evaluation.  Alm Clay, MD "

## 2024-03-26 NOTE — Telephone Encounter (Signed)
 Called left detail message on secure voicemail -- appt schedule for 04/11/24 at 2:45 PM  please arrive by 2:30 pm  New address given   Contact office if date and time is not suitable.

## 2024-04-02 DIAGNOSIS — G6289 Other specified polyneuropathies: Secondary | ICD-10-CM

## 2024-04-02 MED ORDER — GABAPENTIN 300 MG PO CAPS: 300.0000 mg | ORAL_CAPSULE | Freq: Three times a day (TID) | ORAL | 1 refills | Status: AC | PRN

## 2024-04-02 NOTE — Telephone Encounter (Signed)
 Copied from CRM [id number]. Topic: Clinical - Medication Refill >> Apr 02, 2024  8:31 AM Deleta RAMAN wrote: Medication: gabapentin  (NEURONTIN ) 300 MG capsule  Has the patient contacted their pharmacy? No (Agent: If no, request that the patient contact the pharmacy for the refill. If patient does not wish to contact the pharmacy document the reason why and proceed with request.) (Agent: If yes, when and what did the pharmacy advise?)  This is the patient's preferred pharmacy:  [facility] - [place], [place] - [address] [place] [address] Phone: [phone] Fax: [phone]   Is this the correct pharmacy for this prescription? Yes If no, delete pharmacy and type the correct one.   Has the prescription been filled recently? No  Is the patient out of the medication? No  Has the patient been seen for an appointment in the last year OR does the patient have an upcoming appointment? Yes  Can we respond through MyChart? No  Agent: Please be advised that Rx refills may take up to 3 business days. We ask that you follow-up with your pharmacy.
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
## 2024-04-10 ENCOUNTER — Encounter: Admitting: Family Medicine

## 2024-04-11 ENCOUNTER — Encounter: Payer: Self-pay | Admitting: General Practice

## 2024-04-11 ENCOUNTER — Ambulatory Visit: Attending: General Practice | Admitting: General Practice

## 2024-04-11 VITALS — BP 128/60 | HR 99 | Ht 68.0 in | Wt 195.6 lb

## 2024-04-11 DIAGNOSIS — Z95810 Presence of automatic (implantable) cardiac defibrillator: Secondary | ICD-10-CM | POA: Diagnosis not present

## 2024-04-11 DIAGNOSIS — I4891 Unspecified atrial fibrillation: Secondary | ICD-10-CM

## 2024-04-11 DIAGNOSIS — E781 Pure hyperglyceridemia: Secondary | ICD-10-CM | POA: Diagnosis not present

## 2024-04-11 DIAGNOSIS — I447 Left bundle-branch block, unspecified: Secondary | ICD-10-CM

## 2024-04-11 DIAGNOSIS — I428 Other cardiomyopathies: Secondary | ICD-10-CM | POA: Diagnosis not present

## 2024-04-11 DIAGNOSIS — I5022 Chronic systolic (congestive) heart failure: Secondary | ICD-10-CM

## 2024-04-11 DIAGNOSIS — E785 Hyperlipidemia, unspecified: Secondary | ICD-10-CM

## 2024-04-11 NOTE — Patient Instructions (Addendum)
 Medication Instructions:  NO CHANGES *If you need a refill on your cardiac medications before your next appointment, please call your pharmacy*  Lab Work: NO LABS If you have labs (blood work) drawn today and your tests are completely normal, you will receive your results only by: MyChart Message (if you have MyChart) OR A paper copy in the mail If you have any lab test that is abnormal or we need to change your treatment, we will call you to review the results.  Testing/Procedures:[address] IN 2-4 WEEKS  Your physician has requested that you have an echocardiogram. Echocardiography is a painless test that uses sound waves to create images of your heart. It provides your doctor with information about the size and shape of your heart and how well your hearts chambers and valves are working. This procedure takes approximately one hour. There are no restrictions for this procedure. Please do NOT wear cologne, perfume, aftershave, or lotions (deodorant is allowed). Please arrive 15 minutes prior to your appointment time.  Please note: We ask at that you not bring children with you during ultrasound (echo/ vascular) testing. Due to room size and safety concerns, children are not allowed in the ultrasound rooms during exams. Our front office staff cannot provide observation of children in our lobby area while testing is being conducted. An adult accompanying a patient to their appointment will only be allowed in the ultrasound room at the discretion of the ultrasound technician under special circumstances. We apologize for any inconvenience.   Follow-Up: At [facility], you and your health needs are our priority.  As part of our continuing mission to provide you with exceptional heart care, our providers are all part of one team.  This team includes your primary Cardiologist (physician) and Advanced Practice Providers or APPs (Physician Assistants and Nurse Practitioners) who all  work together to provide you with the care you need, when you need it.  Your next appointment:   4 month(s)  Provider:   Alm Clay, MD or Josefa Beauvais, NP   Other Instructions PROVIDER RECOMMENDS HEART HEALTHY DIET  MAINTAIN PHYSICAL ACTIVITY AS TOLERATED

## 2024-04-25 DIAGNOSIS — I428 Other cardiomyopathies: Secondary | ICD-10-CM

## 2024-04-25 LAB — CUP PACEART REMOTE DEVICE CHECK
Battery Remaining Longevity: 56 mo
Battery Remaining Percentage: 63 %
Battery Voltage: 2.98 V
Brady Statistic AP VP Percent: 1 %
Brady Statistic AP VS Percent: 1 %
Brady Statistic AS VP Percent: 95 %
Brady Statistic AS VS Percent: 4.6 %
Brady Statistic RA Percent Paced: 1 %
Date Time Interrogation Session: 20260129020018
HighPow Impedance: 70 Ohm
HighPow Impedance: 70 Ohm
Implantable Lead Connection Status: 753985
Implantable Lead Connection Status: 753985
Implantable Lead Connection Status: 753985
Implantable Lead Implant Date: 20230802
Implantable Lead Implant Date: 20230802
Implantable Lead Implant Date: 20230802
Implantable Lead Location: 753859
Implantable Lead Location: 753860
Implantable Lead Location: 753860
Implantable Lead Model: 7122
Implantable Pulse Generator Implant Date: 20230802
Lead Channel Impedance Value: 260 Ohm
Lead Channel Impedance Value: 380 Ohm
Lead Channel Impedance Value: 390 Ohm
Lead Channel Pacing Threshold Amplitude: 0.625 V
Lead Channel Pacing Threshold Amplitude: 0.75 V
Lead Channel Pacing Threshold Amplitude: 2.25 V
Lead Channel Pacing Threshold Pulse Width: 0.05 ms
Lead Channel Pacing Threshold Pulse Width: 0.5 ms
Lead Channel Pacing Threshold Pulse Width: 0.5 ms
Lead Channel Sensing Intrinsic Amplitude: 11.3 mV
Lead Channel Sensing Intrinsic Amplitude: 4.4 mV
Lead Channel Setting Pacing Amplitude: 0.25 V
Lead Channel Setting Pacing Amplitude: 2 V
Lead Channel Setting Pacing Amplitude: 2 V
Lead Channel Setting Pacing Pulse Width: 0.05 ms
Lead Channel Setting Pacing Pulse Width: 0.5 ms
Lead Channel Setting Sensing Sensitivity: 0.5 mV
Pulse Gen Serial Number: 5544603

## 2024-05-02 NOTE — Progress Notes (Signed)
 Remote ICD Transmission

## 2024-05-03 ENCOUNTER — Ambulatory Visit: Payer: Self-pay | Admitting: General Practice

## 2024-05-03 ENCOUNTER — Ambulatory Visit (HOSPITAL_COMMUNITY): Admission: RE | Admit: 2024-05-03 | Source: Ambulatory Visit

## 2024-05-03 DIAGNOSIS — I428 Other cardiomyopathies: Secondary | ICD-10-CM

## 2024-05-03 LAB — ECHOCARDIOGRAM COMPLETE
Area-P 1/2: 3.51 cm2
Calc EF: 14.2 %
Est EF: <20
S' Lateral: 5.9 cm
Single Plane A2C EF: 17.8 %
Single Plane A4C EF: 7.4 %

## 2024-05-17 ENCOUNTER — Ambulatory Visit: Admitting: Infectious Diseases

## 2024-07-25 ENCOUNTER — Ambulatory Visit

## 2024-07-31 ENCOUNTER — Ambulatory Visit: Admitting: Cardiology

## 2024-10-24 ENCOUNTER — Ambulatory Visit

## 2025-01-15 ENCOUNTER — Encounter: Admitting: Family Medicine

## 2025-01-23 ENCOUNTER — Ambulatory Visit

## 2025-04-24 ENCOUNTER — Ambulatory Visit

## 2025-07-24 ENCOUNTER — Ambulatory Visit
